# Patient Record
Sex: Male | Born: 1970
Health system: Southern US, Community
[De-identification: ages and names within clinical notes are randomized; demographics above are authoritative.]

## PROBLEM LIST (undated history)

## (undated) DIAGNOSIS — E119 Type 2 diabetes mellitus without complications: Secondary | ICD-10-CM

## (undated) DIAGNOSIS — E785 Hyperlipidemia, unspecified: Secondary | ICD-10-CM

## (undated) DIAGNOSIS — G4730 Sleep apnea, unspecified: Secondary | ICD-10-CM

## (undated) DIAGNOSIS — G4733 Obstructive sleep apnea (adult) (pediatric): Secondary | ICD-10-CM

## (undated) DIAGNOSIS — G709 Myoneural disorder, unspecified: Secondary | ICD-10-CM

## (undated) DIAGNOSIS — I10 Essential (primary) hypertension: Secondary | ICD-10-CM

## (undated) DIAGNOSIS — F419 Anxiety disorder, unspecified: Secondary | ICD-10-CM

## (undated) DIAGNOSIS — N200 Calculus of kidney: Secondary | ICD-10-CM

## (undated) DIAGNOSIS — F32A Depression, unspecified: Secondary | ICD-10-CM

## (undated) DIAGNOSIS — Z87442 Personal history of urinary calculi: Secondary | ICD-10-CM

## (undated) DIAGNOSIS — I739 Peripheral vascular disease, unspecified: Secondary | ICD-10-CM

## (undated) DIAGNOSIS — J45909 Unspecified asthma, uncomplicated: Secondary | ICD-10-CM

## (undated) HISTORY — DX: Depression, unspecified: F32.A

## (undated) HISTORY — DX: Calculus of kidney: N20.0

## (undated) HISTORY — PX: ABDOMINAL SURGERY: SHX537

## (undated) HISTORY — PX: WISDOM TOOTH EXTRACTION: SHX21

## (undated) HISTORY — DX: Sleep apnea, unspecified: G47.30

---
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
## 2000-06-05 ENCOUNTER — Encounter: Payer: Self-pay | Admitting: Emergency Medicine

## 2000-06-05 ENCOUNTER — Emergency Department (HOSPITAL_COMMUNITY): Admission: EM | Admit: 2000-06-05 | Discharge: 2000-06-05 | Payer: Self-pay | Admitting: Emergency Medicine

## 2003-08-08 ENCOUNTER — Ambulatory Visit (HOSPITAL_BASED_OUTPATIENT_CLINIC_OR_DEPARTMENT_OTHER): Admission: RE | Admit: 2003-08-08 | Discharge: 2003-08-08 | Payer: Self-pay | Admitting: Family Medicine

## 2008-06-17 ENCOUNTER — Emergency Department (HOSPITAL_BASED_OUTPATIENT_CLINIC_OR_DEPARTMENT_OTHER): Admission: EM | Admit: 2008-06-17 | Discharge: 2008-06-17 | Payer: Self-pay | Admitting: Emergency Medicine

## 2009-09-29 HISTORY — PX: CYST EXCISION: SHX5701

## 2011-06-30 LAB — CBC
HCT: 41.7
Hemoglobin: 14.4
MCHC: 34.6
MCV: 79.6
Platelets: 146 — ABNORMAL LOW
RBC: 5.23
RDW: 13.1
WBC: 5.4

## 2011-06-30 LAB — BASIC METABOLIC PANEL
BUN: 12
CO2: 20
Calcium: 9.6
Chloride: 105
Creatinine, Ser: 1.1
GFR calc Af Amer: >60
GFR calc non Af Amer: >60
Glucose, Bld: 193 — ABNORMAL HIGH
Potassium: 4.7
Sodium: 136

## 2011-06-30 LAB — DIFFERENTIAL
Basophils Absolute: 0
Basophils Relative: 1
Eosinophils Absolute: 0
Eosinophils Relative: 0
Lymphocytes Relative: 18
Lymphs Abs: 1
Monocytes Absolute: 0.2
Monocytes Relative: 5
Neutro Abs: 4.2
Neutrophils Relative %: 76

## 2011-06-30 LAB — GLUCOSE, CAPILLARY: Glucose-Capillary: 212 — ABNORMAL HIGH

## 2016-09-25 DIAGNOSIS — E1165 Type 2 diabetes mellitus with hyperglycemia: Secondary | ICD-10-CM | POA: Diagnosis not present

## 2016-09-25 DIAGNOSIS — E7800 Pure hypercholesterolemia, unspecified: Secondary | ICD-10-CM | POA: Diagnosis not present

## 2016-09-25 DIAGNOSIS — G4730 Sleep apnea, unspecified: Secondary | ICD-10-CM | POA: Diagnosis not present

## 2016-09-25 DIAGNOSIS — I10 Essential (primary) hypertension: Secondary | ICD-10-CM | POA: Diagnosis not present

## 2017-08-10 DIAGNOSIS — I10 Essential (primary) hypertension: Secondary | ICD-10-CM | POA: Diagnosis not present

## 2017-08-10 DIAGNOSIS — E1165 Type 2 diabetes mellitus with hyperglycemia: Secondary | ICD-10-CM | POA: Diagnosis not present

## 2017-08-10 DIAGNOSIS — F324 Major depressive disorder, single episode, in partial remission: Secondary | ICD-10-CM | POA: Diagnosis not present

## 2017-08-10 DIAGNOSIS — Z125 Encounter for screening for malignant neoplasm of prostate: Secondary | ICD-10-CM | POA: Diagnosis not present

## 2017-08-10 DIAGNOSIS — E7800 Pure hypercholesterolemia, unspecified: Secondary | ICD-10-CM | POA: Diagnosis not present

## 2017-11-10 DIAGNOSIS — E1165 Type 2 diabetes mellitus with hyperglycemia: Secondary | ICD-10-CM | POA: Diagnosis not present

## 2017-11-10 DIAGNOSIS — N5202 Corporo-venous occlusive erectile dysfunction: Secondary | ICD-10-CM | POA: Diagnosis not present

## 2017-11-10 DIAGNOSIS — E7800 Pure hypercholesterolemia, unspecified: Secondary | ICD-10-CM | POA: Diagnosis not present

## 2017-11-10 DIAGNOSIS — I10 Essential (primary) hypertension: Secondary | ICD-10-CM | POA: Diagnosis not present

## 2018-12-29 DIAGNOSIS — F419 Anxiety disorder, unspecified: Secondary | ICD-10-CM | POA: Diagnosis not present

## 2018-12-29 DIAGNOSIS — I10 Essential (primary) hypertension: Secondary | ICD-10-CM | POA: Diagnosis not present

## 2018-12-29 DIAGNOSIS — E1165 Type 2 diabetes mellitus with hyperglycemia: Secondary | ICD-10-CM | POA: Diagnosis not present

## 2018-12-29 DIAGNOSIS — E7800 Pure hypercholesterolemia, unspecified: Secondary | ICD-10-CM | POA: Diagnosis not present

## 2019-01-10 DIAGNOSIS — G4733 Obstructive sleep apnea (adult) (pediatric): Secondary | ICD-10-CM | POA: Diagnosis not present

## 2019-01-19 DIAGNOSIS — G4733 Obstructive sleep apnea (adult) (pediatric): Secondary | ICD-10-CM | POA: Diagnosis not present

## 2019-02-10 DIAGNOSIS — G4733 Obstructive sleep apnea (adult) (pediatric): Secondary | ICD-10-CM | POA: Diagnosis not present

## 2019-03-13 DIAGNOSIS — G4733 Obstructive sleep apnea (adult) (pediatric): Secondary | ICD-10-CM | POA: Diagnosis not present

## 2019-04-12 DIAGNOSIS — G4733 Obstructive sleep apnea (adult) (pediatric): Secondary | ICD-10-CM | POA: Diagnosis not present

## 2019-04-24 ENCOUNTER — Emergency Department (HOSPITAL_BASED_OUTPATIENT_CLINIC_OR_DEPARTMENT_OTHER): Payer: BC Managed Care – PPO

## 2019-04-24 ENCOUNTER — Encounter (HOSPITAL_BASED_OUTPATIENT_CLINIC_OR_DEPARTMENT_OTHER): Payer: Self-pay | Admitting: Emergency Medicine

## 2019-04-24 ENCOUNTER — Inpatient Hospital Stay (HOSPITAL_BASED_OUTPATIENT_CLINIC_OR_DEPARTMENT_OTHER)
Admission: EM | Admit: 2019-04-24 | Discharge: 2019-04-26 | DRG: 176 | Disposition: A | Discharge status: Home / Self Care | Payer: BC Managed Care – PPO | Attending: Internal Medicine | Admitting: Internal Medicine

## 2019-04-24 ENCOUNTER — Other Ambulatory Visit: Payer: Self-pay

## 2019-04-24 DIAGNOSIS — E669 Obesity, unspecified: Secondary | ICD-10-CM | POA: Diagnosis not present

## 2019-04-24 DIAGNOSIS — I2699 Other pulmonary embolism without acute cor pulmonale: Principal | ICD-10-CM | POA: Diagnosis present

## 2019-04-24 DIAGNOSIS — Z7984 Long term (current) use of oral hypoglycemic drugs: Secondary | ICD-10-CM

## 2019-04-24 DIAGNOSIS — F419 Anxiety disorder, unspecified: Secondary | ICD-10-CM | POA: Diagnosis not present

## 2019-04-24 DIAGNOSIS — I10 Essential (primary) hypertension: Secondary | ICD-10-CM | POA: Diagnosis present

## 2019-04-24 DIAGNOSIS — J45909 Unspecified asthma, uncomplicated: Secondary | ICD-10-CM | POA: Diagnosis present

## 2019-04-24 DIAGNOSIS — E1165 Type 2 diabetes mellitus with hyperglycemia: Secondary | ICD-10-CM | POA: Diagnosis not present

## 2019-04-24 DIAGNOSIS — J4520 Mild intermittent asthma, uncomplicated: Secondary | ICD-10-CM | POA: Diagnosis not present

## 2019-04-24 DIAGNOSIS — E785 Hyperlipidemia, unspecified: Secondary | ICD-10-CM | POA: Diagnosis not present

## 2019-04-24 DIAGNOSIS — I361 Nonrheumatic tricuspid (valve) insufficiency: Secondary | ICD-10-CM | POA: Diagnosis not present

## 2019-04-24 DIAGNOSIS — G4733 Obstructive sleep apnea (adult) (pediatric): Secondary | ICD-10-CM | POA: Diagnosis not present

## 2019-04-24 DIAGNOSIS — Z79899 Other long term (current) drug therapy: Secondary | ICD-10-CM | POA: Diagnosis not present

## 2019-04-24 DIAGNOSIS — R000 Tachycardia, unspecified: Secondary | ICD-10-CM | POA: Diagnosis not present

## 2019-04-24 DIAGNOSIS — E0865 Diabetes mellitus due to underlying condition with hyperglycemia: Secondary | ICD-10-CM | POA: Diagnosis not present

## 2019-04-24 DIAGNOSIS — R0602 Shortness of breath: Secondary | ICD-10-CM | POA: Diagnosis not present

## 2019-04-24 DIAGNOSIS — Z20828 Contact with and (suspected) exposure to other viral communicable diseases: Secondary | ICD-10-CM | POA: Diagnosis present

## 2019-04-24 HISTORY — DX: Other pulmonary embolism without acute cor pulmonale: I26.99

## 2019-04-24 HISTORY — DX: Obstructive sleep apnea (adult) (pediatric): G47.33

## 2019-04-24 HISTORY — DX: Anxiety disorder, unspecified: F41.9

## 2019-04-24 HISTORY — DX: Hyperlipidemia, unspecified: E78.5

## 2019-04-24 HISTORY — DX: Type 2 diabetes mellitus without complications: E11.9

## 2019-04-24 HISTORY — DX: Unspecified asthma, uncomplicated: J45.909

## 2019-04-24 LAB — CBC WITH DIFFERENTIAL/PLATELET
Abs Immature Granulocytes: 0.02 10*3/uL (ref 0.00–0.07)
Basophils Absolute: 0 10*3/uL (ref 0.0–0.1)
Basophils Relative: 0 %
Eosinophils Absolute: 0.2 10*3/uL (ref 0.0–0.5)
Eosinophils Relative: 2 %
HCT: 46.3 % (ref 39.0–52.0)
Hemoglobin: 15.6 g/dL (ref 13.0–17.0)
Immature Granulocytes: 0 %
Lymphocytes Relative: 21 %
Lymphs Abs: 1.8 10*3/uL (ref 0.7–4.0)
MCH: 27 pg (ref 26.0–34.0)
MCHC: 33.7 g/dL (ref 30.0–36.0)
MCV: 80.1 fL (ref 80.0–100.0)
Monocytes Absolute: 0.5 10*3/uL (ref 0.1–1.0)
Monocytes Relative: 6 %
Neutro Abs: 5.9 10*3/uL (ref 1.7–7.7)
Neutrophils Relative %: 71 %
Platelets: 188 10*3/uL (ref 150–400)
RBC: 5.78 MIL/uL (ref 4.22–5.81)
RDW: 13.2 % (ref 11.5–15.5)
WBC: 8.5 10*3/uL (ref 4.0–10.5)
nRBC: 0 % (ref 0.0–0.2)

## 2019-04-24 LAB — COMPREHENSIVE METABOLIC PANEL
ALT: 29 U/L (ref 0–44)
AST: 18 U/L (ref 15–41)
Albumin: 4.5 g/dL (ref 3.5–5.0)
Alkaline Phosphatase: 51 U/L (ref 38–126)
Anion gap: 14 (ref 5–15)
BUN: 15 mg/dL (ref 6–20)
CO2: 19 mmol/L — ABNORMAL LOW (ref 22–32)
Calcium: 9.4 mg/dL (ref 8.9–10.3)
Chloride: 104 mmol/L (ref 98–111)
Creatinine, Ser: 1.07 mg/dL (ref 0.61–1.24)
GFR calc Af Amer: >60 mL/min (ref >60)
GFR calc non Af Amer: >60 mL/min (ref >60)
Glucose, Bld: 244 mg/dL — ABNORMAL HIGH (ref 70–99)
Potassium: 4.3 mmol/L (ref 3.5–5.1)
Sodium: 137 mmol/L (ref 135–145)
Total Bilirubin: 1.2 mg/dL (ref 0.3–1.2)
Total Protein: 7.6 g/dL (ref 6.5–8.1)

## 2019-04-24 LAB — BRAIN NATRIURETIC PEPTIDE: B Natriuretic Peptide: 14.9 pg/mL (ref 0.0–100.0)

## 2019-04-24 LAB — D-DIMER, QUANTITATIVE: D-Dimer, Quant: 2.2 ug/mL-FEU — ABNORMAL HIGH (ref 0.00–0.50)

## 2019-04-24 LAB — SARS CORONAVIRUS 2 BY RT PCR (HOSPITAL ORDER, PERFORMED IN ~~LOC~~ HOSPITAL LAB): SARS Coronavirus 2: NEGATIVE

## 2019-04-24 LAB — TROPONIN I (HIGH SENSITIVITY): Troponin I (High Sensitivity): 6 ng/L (ref <18)

## 2019-04-24 MED ORDER — DIPHENHYDRAMINE HCL 25 MG PO CAPS: 50.0000 mg | ORAL_CAPSULE | Freq: Once | ORAL | Status: AC

## 2019-04-24 MED ORDER — HYDROCORTISONE NA SUCCINATE PF 100 MG IJ SOLR
INTRAMUSCULAR | Status: AC
  Filled 2019-04-24: qty 4

## 2019-04-24 MED ORDER — HEPARIN BOLUS VIA INFUSION
5500.0000 [IU] | Freq: Once | INTRAVENOUS | Status: AC
  Administered 2019-04-24: 5500 [IU] via INTRAVENOUS

## 2019-04-24 MED ORDER — HYDROCORTISONE NA SUCCINATE PF 250 MG IJ SOLR
200.0000 mg | Freq: Once | INTRAMUSCULAR | Status: AC
  Administered 2019-04-24: 200 mg via INTRAVENOUS
  Filled 2019-04-24: qty 200

## 2019-04-24 MED ORDER — IOHEXOL 350 MG/ML SOLN
100.0000 mL | Freq: Once | INTRAVENOUS | Status: AC | PRN
  Administered 2019-04-24: 100 mL via INTRAVENOUS

## 2019-04-24 MED ORDER — DIPHENHYDRAMINE HCL 50 MG/ML IJ SOLN
50.0000 mg | Freq: Once | INTRAMUSCULAR | Status: AC
  Administered 2019-04-24: 50 mg via INTRAVENOUS
  Filled 2019-04-24: qty 1

## 2019-04-24 MED ORDER — HEPARIN (PORCINE) 25000 UT/250ML-% IV SOLN: 16.0000 [IU]/kg/h | INTRAVENOUS | Status: DC

## 2019-04-24 MED ORDER — HEPARIN (PORCINE) 25000 UT/250ML-% IV SOLN
2100.0000 [IU]/h | INTRAVENOUS | Status: DC
  Administered 2019-04-24: 1500 [IU]/h via INTRAVENOUS
  Administered 2019-04-25: 1800 [IU]/h via INTRAVENOUS
  Filled 2019-04-24 (×4): qty 250

## 2019-04-24 NOTE — ED Triage Notes (Signed)
SOB since Thursday with exertion, inhalers not working

## 2019-04-24 NOTE — ED Notes (Signed)
Per EDMD, pt being premedicated per 4 hour premed protocol; EDMD spoke w/Dr. Nevada Crane (radiologist) about allergy

## 2019-04-24 NOTE — ED Notes (Signed)
Called [facility] to advise that Covid test was negative and patient was ready for transport

## 2019-04-24 NOTE — ED Notes (Signed)
Dr Yelverton at bedside.  

## 2019-04-24 NOTE — Progress Notes (Signed)
ANTICOAGULATION CONSULT NOTE - Initial Consult  Pharmacy Consult for heparin Indication: pulmonary embolus  Allergies  Allergen Reactions  . Contrast Media [Iodinated Diagnostic Agents] Hives    As a 48 year old experienced hives and nausea    Patient Measurements: Height: 5\' 10"  (177.8 cm) Weight: 220 lb (99.8 kg) IBW/kg (Calculated) : 73 Heparin Dosing Weight: 94kg  Vital Signs: Temp: 98.1 F (36.7 C) (07/26 1515) Temp Source: Oral (07/26 1515) BP: 142/89 (07/26 2000) Pulse Rate: 108 (07/26 2000)  Labs: Recent Labs    04/24/19 1537 04/24/19 1539  HGB  --  15.6  HCT  --  46.3  PLT  --  188  CREATININE  --  1.07  TROPONINIHS 6  --     Estimated Creatinine Clearance: 101 mL/min (by C-G formula based on SCr of 1.07 mg/dL).   Medical History: No past medical history on file.  Assessment: 48 presenting with bilateral PE, no anticoagulation PTA, CBC wnl.  Goal of Therapy:  Heparin level 0.3-0.7 units/ml Monitor platelets by anticoagulation protocol: Yes   Plan:  Heparin 5500 units IV x1, and gtt at 1500 units/hr F/u 6 hour heparin level  Bertis Ruddy, PharmD Clinical Pharmacist Please check AMION for all [place] numbers 04/24/2019 9:12 PM

## 2019-04-24 NOTE — ED Notes (Signed)
Pt reports having panic attacks and feeling anxious prior to arrival. Pt voiced taking xanax PTA to help.

## 2019-04-24 NOTE — ED Notes (Signed)
No need to obtain repeat troponin per EDP

## 2019-04-24 NOTE — ED Notes (Signed)
Updated pt on plan of care, bed assignment.

## 2019-04-24 NOTE — ED Notes (Signed)
Patient transported to CT 

## 2019-04-24 NOTE — ED Notes (Signed)
Pt used inhaler PTA with no relief and mucinex.

## 2019-04-24 NOTE — ED Notes (Signed)
Attempted to obtain 2nd IV- unsuccessful

## 2019-04-24 NOTE — ED Notes (Signed)
Pt also reports using cpap at night and unable to catch his breath last PM.

## 2019-04-24 NOTE — Care Plan (Signed)
Pt with h/o asthma.  In with apparently unprovoked PEs.  Tachy 110.  Satting 94 on RA.  Will send to tele, inpatient.

## 2019-04-24 NOTE — ED Triage Notes (Signed)
Pt has hx of asthma- presents with SOB and chest tightness.

## 2019-04-25 ENCOUNTER — Inpatient Hospital Stay (HOSPITAL_COMMUNITY): Payer: BC Managed Care – PPO

## 2019-04-25 ENCOUNTER — Encounter (HOSPITAL_COMMUNITY): Payer: Self-pay | Admitting: Internal Medicine

## 2019-04-25 DIAGNOSIS — F419 Anxiety disorder, unspecified: Secondary | ICD-10-CM | POA: Diagnosis present

## 2019-04-25 DIAGNOSIS — J4520 Mild intermittent asthma, uncomplicated: Secondary | ICD-10-CM

## 2019-04-25 DIAGNOSIS — G4733 Obstructive sleep apnea (adult) (pediatric): Secondary | ICD-10-CM | POA: Diagnosis present

## 2019-04-25 DIAGNOSIS — I361 Nonrheumatic tricuspid (valve) insufficiency: Secondary | ICD-10-CM

## 2019-04-25 DIAGNOSIS — I2699 Other pulmonary embolism without acute cor pulmonale: Principal | ICD-10-CM

## 2019-04-25 DIAGNOSIS — J45909 Unspecified asthma, uncomplicated: Secondary | ICD-10-CM | POA: Diagnosis present

## 2019-04-25 DIAGNOSIS — E785 Hyperlipidemia, unspecified: Secondary | ICD-10-CM | POA: Diagnosis present

## 2019-04-25 LAB — GLUCOSE, CAPILLARY
Glucose-Capillary: 214 mg/dL — ABNORMAL HIGH (ref 70–99)
Glucose-Capillary: 193 mg/dL — ABNORMAL HIGH (ref 70–99)
Glucose-Capillary: 226 mg/dL — ABNORMAL HIGH (ref 70–99)
Glucose-Capillary: 218 mg/dL — ABNORMAL HIGH (ref 70–99)
Glucose-Capillary: 194 mg/dL — ABNORMAL HIGH (ref 70–99)

## 2019-04-25 LAB — BASIC METABOLIC PANEL
Anion gap: 12 (ref 5–15)
BUN: 15 mg/dL (ref 6–20)
CO2: 21 mmol/L — ABNORMAL LOW (ref 22–32)
Calcium: 9.3 mg/dL (ref 8.9–10.3)
Chloride: 104 mmol/L (ref 98–111)
Creatinine, Ser: 1 mg/dL (ref 0.61–1.24)
GFR calc Af Amer: >60 mL/min (ref >60)
GFR calc non Af Amer: >60 mL/min (ref >60)
Glucose, Bld: 233 mg/dL — ABNORMAL HIGH (ref 70–99)
Potassium: 4 mmol/L (ref 3.5–5.1)
Sodium: 137 mmol/L (ref 135–145)

## 2019-04-25 LAB — CBC
HCT: 44.2 % (ref 39.0–52.0)
Hemoglobin: 15.3 g/dL (ref 13.0–17.0)
MCH: 27.2 pg (ref 26.0–34.0)
MCHC: 34.6 g/dL (ref 30.0–36.0)
MCV: 78.6 fL — ABNORMAL LOW (ref 80.0–100.0)
Platelets: 214 10*3/uL (ref 150–400)
RBC: 5.62 MIL/uL (ref 4.22–5.81)
RDW: 13.2 % (ref 11.5–15.5)
WBC: 7.7 10*3/uL (ref 4.0–10.5)
nRBC: 0 % (ref 0.0–0.2)

## 2019-04-25 LAB — ECHOCARDIOGRAM COMPLETE
Height: 70 in
Weight: 3520 oz

## 2019-04-25 LAB — HEMOGLOBIN A1C
Hgb A1c MFr Bld: 10.1 % — ABNORMAL HIGH (ref 4.8–5.6)
Mean Plasma Glucose: 243.17 mg/dL

## 2019-04-25 LAB — HEPARIN LEVEL (UNFRACTIONATED)
Heparin Unfractionated: <0.1 IU/mL — ABNORMAL LOW (ref 0.30–0.70)
Heparin Unfractionated: 0.29 IU/mL — ABNORMAL LOW (ref 0.30–0.70)
Heparin Unfractionated: 0.41 IU/mL (ref 0.30–0.70)

## 2019-04-25 LAB — ANTITHROMBIN III: AntiThromb III Func: 106 % (ref 75–120)

## 2019-04-25 LAB — HIV ANTIBODY (ROUTINE TESTING W REFLEX): HIV Screen 4th Generation wRfx: NONREACTIVE

## 2019-04-25 MED ORDER — HEPARIN BOLUS VIA INFUSION
1500.0000 [IU] | Freq: Once | INTRAVENOUS | Status: AC
  Administered 2019-04-25: 1500 [IU] via INTRAVENOUS
  Filled 2019-04-25: qty 1500

## 2019-04-25 MED ORDER — SODIUM CHLORIDE 0.9 % IV BOLUS
500.0000 mL | Freq: Once | INTRAVENOUS | Status: AC
  Administered 2019-04-25: 500 mL via INTRAVENOUS

## 2019-04-25 MED ORDER — HYDRALAZINE HCL 20 MG/ML IJ SOLN: 5.0000 mg | INTRAMUSCULAR | Status: DC | PRN

## 2019-04-25 MED ORDER — ONDANSETRON HCL 4 MG/2ML IJ SOLN: 4.0000 mg | Freq: Four times a day (QID) | INTRAMUSCULAR | Status: DC | PRN

## 2019-04-25 MED ORDER — FENOFIBRATE 160 MG PO TABS
160.0000 mg | ORAL_TABLET | Freq: Every day | ORAL | Status: DC
  Administered 2019-04-25 – 2019-04-26 (×2): 160 mg via ORAL
  Filled 2019-04-25 (×3): qty 1

## 2019-04-25 MED ORDER — OXYCODONE-ACETAMINOPHEN 5-325 MG PO TABS: 1.0000 | ORAL_TABLET | ORAL | Status: DC | PRN

## 2019-04-25 MED ORDER — CALCIUM CARBONATE ANTACID 500 MG PO CHEW
400.0000 mg | CHEWABLE_TABLET | Freq: Once | ORAL | Status: AC
  Administered 2019-04-25: 400 mg via ORAL
  Filled 2019-04-25: qty 2

## 2019-04-25 MED ORDER — LOSARTAN POTASSIUM 50 MG PO TABS
50.0000 mg | ORAL_TABLET | Freq: Every day | ORAL | Status: DC
  Administered 2019-04-25: 50 mg via ORAL
  Filled 2019-04-25: qty 1

## 2019-04-25 MED ORDER — DM-GUAIFENESIN ER 30-600 MG PO TB12: 1.0000 | ORAL_TABLET | Freq: Two times a day (BID) | ORAL | Status: DC | PRN

## 2019-04-25 MED ORDER — LEVALBUTEROL TARTRATE 45 MCG/ACT IN AERO: 2.0000 | INHALATION_SPRAY | Freq: Four times a day (QID) | RESPIRATORY_TRACT | Status: DC | PRN

## 2019-04-25 MED ORDER — ACETAMINOPHEN 325 MG PO TABS
650.0000 mg | ORAL_TABLET | Freq: Four times a day (QID) | ORAL | Status: DC | PRN
  Administered 2019-04-25 – 2019-04-26 (×2): 650 mg via ORAL
  Filled 2019-04-25 (×2): qty 2

## 2019-04-25 MED ORDER — ALPRAZOLAM 0.25 MG PO TABS
0.2500 mg | ORAL_TABLET | Freq: Two times a day (BID) | ORAL | Status: DC | PRN
  Administered 2019-04-25 (×2): 0.25 mg via ORAL
  Filled 2019-04-25 (×2): qty 1

## 2019-04-25 MED ORDER — ALBUTEROL SULFATE (2.5 MG/3ML) 0.083% IN NEBU
2.5000 mg | INHALATION_SOLUTION | Freq: Four times a day (QID) | RESPIRATORY_TRACT | Status: DC | PRN
  Administered 2019-04-25: 2.5 mg via RESPIRATORY_TRACT
  Filled 2019-04-25: qty 3

## 2019-04-25 MED ORDER — ACETAMINOPHEN 650 MG RE SUPP: 650.0000 mg | Freq: Four times a day (QID) | RECTAL | Status: DC | PRN

## 2019-04-25 MED ORDER — ONDANSETRON HCL 4 MG PO TABS: 4.0000 mg | ORAL_TABLET | Freq: Four times a day (QID) | ORAL | Status: DC | PRN

## 2019-04-25 MED ORDER — SODIUM CHLORIDE 0.9 % IV SOLN
INTRAVENOUS | Status: DC
  Administered 2019-04-25: 03:00:00 via INTRAVENOUS

## 2019-04-25 MED ORDER — INSULIN ASPART 100 UNIT/ML ~~LOC~~ SOLN
0.0000 [IU] | Freq: Three times a day (TID) | SUBCUTANEOUS | Status: DC
  Administered 2019-04-25 (×2): 3 [IU] via SUBCUTANEOUS
  Administered 2019-04-25: 2 [IU] via SUBCUTANEOUS

## 2019-04-25 MED ORDER — HEPARIN BOLUS VIA INFUSION
3000.0000 [IU] | Freq: Once | INTRAVENOUS | Status: AC
  Administered 2019-04-25: 3000 [IU] via INTRAVENOUS
  Filled 2019-04-25: qty 3000

## 2019-04-25 MED ORDER — INSULIN ASPART 100 UNIT/ML ~~LOC~~ SOLN
0.0000 [IU] | Freq: Every day | SUBCUTANEOUS | Status: DC
  Administered 2019-04-25: 2 [IU] via SUBCUTANEOUS

## 2019-04-25 NOTE — H&P (Signed)
History and Physical    [name]:[id number] DOB: 04-12-1971 DOA: 04/24/2019  Referring MD/NP/PA:   PCP: Shirline Frees, MD   Patient coming from:  The patient is coming from home.  At baseline, pt is independent for most of ADL.        Chief Complaint: Shortness of breath  HPI: Steven [name] is a 48 y.o. male with medical history significant of hypertension, hyperlipidemia, diabetes mellitus, asthma, OSA on CPAP, anxiety, who presents with shortness of breath.  Patient states that he has been having shortness of breath for 3 days, which is worse on exertion.  He has mild dry cough and chest tightness, but no chest pain.  No fever or chills.  No recent long distance traveling.  Denies tenderness in the calf areas.  Patient does not have nausea, vomiting, diarrhea, abdominal pain, symptoms of UTI or unilateral weakness.  ED Course: pt was found to have positive d-dimer, negative COVID-19 test, creatinine 1.07, BUN 15, temperature normal, tachycardia, tachypnea, oxygen saturation 92 to 99% on room air, blood pressure 141/95.  Chest x-ray negative.  CT angiogram showed bilateral PE with CT evidence of right heart straining.  Patient is admitted to telemetry bed as inpatient.  Review of Systems:   General: no fevers, chills, no body weight gain, has fatigue HEENT: no blurry vision, hearing changes or sore throat Respiratory: has dyspnea, coughing, no wheezing CV: no chest pain, no palpitations GI: no nausea, vomiting, abdominal pain, diarrhea, constipation GU: no dysuria, burning on urination, increased urinary frequency, hematuria  Ext: no leg edema Neuro: no unilateral weakness, numbness, or tingling, no vision change or hearing loss Skin: no rash, no skin tear. MSK: No muscle spasm, no deformity, no limitation of range of movement in spin Heme: No easy bruising.  Travel history: No recent long distant travel.  Allergy:  Allergies  Allergen Reactions  . Contrast Media  [Iodinated Diagnostic Agents] Hives    As a 48 year old experienced hives and nausea    Past Medical History:  Diagnosis Date  . Anxiety   . Asthma   . Diabetes mellitus without complication ([place])   . HLD (hyperlipidemia)   . OSA (obstructive sleep apnea)     History reviewed. No pertinent surgical history.  Social History:  reports that he has never smoked. He has never used smokeless tobacco. He reports previous alcohol use. He reports that he does not use drugs.  Family History:  Family History  Problem Relation Age of Onset  . Diabetes Mellitus II Mother   . Thrombophlebitis Father      Prior to Admission medications   Not on File    Physical Exam: Vitals:   04/24/19 2000 04/24/19 2300 04/24/19 2307 04/25/19 0014  BP: (!) 142/89  138/85 (!) 141/95  Pulse: (!) 108 (!) 108 (!) 108 (!) 112  Resp: 15 (!) 21 19   Temp:   98.3 F (36.8 C) 98.4 F (36.9 C)  TempSrc:   Oral Oral  SpO2: 94% 95% 94% 99%  Weight:      Height:       General: Not in acute distress HEENT:       Eyes: PERRL, EOMI, no scleral icterus.       ENT: No discharge from the ears and nose, no pharynx injection, no tonsillar enlargement.        Neck: No JVD, no bruit, no mass felt. Heme: No neck lymph node enlargement. Cardiac: S1/S2, RRR, tachycardia, no murmurs,  No gallops or rubs. Respiratory: No rales, wheezing, rhonchi or rubs. GI: Soft, nondistended, nontender, no rebound pain, no organomegaly, BS present. GU: No hematuria Ext: No pitting leg edema bilaterally. 2+DP/PT pulse bilaterally. Musculoskeletal: No joint deformities, No joint redness or warmth, no limitation of ROM in spin. Skin: No rashes.  Neuro: Alert, oriented X3, cranial nerves II-XII grossly intact, moves all extremities normally.  Psych: Patient is not psychotic, no suicidal or hemocidal ideation.  Labs on Admission: I have personally reviewed following labs and imaging studies  CBC: Recent Labs  Lab 04/24/19 1539   WBC 8.5  NEUTROABS 5.9  HGB 15.6  HCT 46.3  MCV 80.1  PLT 829   Basic Metabolic Panel: Recent Labs  Lab 04/24/19 1539  NA 137  K 4.3  CL 104  CO2 19*  GLUCOSE 244*  BUN 15  CREATININE 1.07  CALCIUM 9.4   GFR: Estimated Creatinine Clearance: 101 mL/min (by C-G formula based on SCr of 1.07 mg/dL). Liver Function Tests: Recent Labs  Lab 04/24/19 1539  AST 18  ALT 29  ALKPHOS 51  BILITOT 1.2  PROT 7.6  ALBUMIN 4.5   No results for input(s): LIPASE, AMYLASE in the last 168 hours. No results for input(s): AMMONIA in the last 168 hours. Coagulation Profile: No results for input(s): INR, PROTIME in the last 168 hours. Cardiac Enzymes: No results for input(s): CKTOTAL, CKMB, CKMBINDEX, TROPONINI in the last 168 hours. BNP (last 3 results) No results for input(s): PROBNP in the last 8760 hours. HbA1C: No results for input(s): HGBA1C in the last 72 hours. CBG: No results for input(s): GLUCAP in the last 168 hours. Lipid Profile: No results for input(s): CHOL, HDL, LDLCALC, TRIG, CHOLHDL, LDLDIRECT in the last 72 hours. Thyroid Function Tests: No results for input(s): TSH, T4TOTAL, FREET4, T3FREE, THYROIDAB in the last 72 hours. Anemia Panel: No results for input(s): VITAMINB12, FOLATE, FERRITIN, TIBC, IRON, RETICCTPCT in the last 72 hours. Urine analysis: No results found for: COLORURINE, APPEARANCEUR, LABSPEC, PHURINE, GLUCOSEU, HGBUR, BILIRUBINUR, KETONESUR, PROTEINUR, UROBILINOGEN, NITRITE, LEUKOCYTESUR Sepsis Labs: @LABRCNTIP (procalcitonin:4,lacticidven:4) ) Recent Results (from the past 240 hour(s))  SARS Coronavirus 2 (Performed in [place] hospital lab)     Status: None   Collection Time: 04/24/19  9:50 PM   Specimen: Nasopharyngeal Swab  Result Value Ref Range Status   SARS Coronavirus 2 NEGATIVE NEGATIVE Final    Comment: (NOTE) If result is NEGATIVE SARS-CoV-2 target nucleic acids are NOT DETECTED. The SARS-CoV-2 RNA is generally detectable in upper  and lower  respiratory specimens during the acute phase of infection. The lowest  concentration of SARS-CoV-2 viral copies this assay can detect is 250  copies / mL. A negative result does not preclude SARS-CoV-2 infection  and should not be used as the sole basis for treatment or other  patient management decisions.  A negative result may occur with  improper specimen collection / handling, submission of specimen other  than nasopharyngeal swab, presence of viral mutation(s) within the  areas targeted by this assay, and inadequate number of viral copies  (<250 copies / mL). A negative result must be combined with clinical  observations, patient history, and epidemiological information. If result is POSITIVE SARS-CoV-2 target nucleic acids are DETECTED. The SARS-CoV-2 RNA is generally detectable in upper and lower  respiratory specimens dur ing the acute phase of infection.  Positive  results are indicative of active infection with SARS-CoV-2.  Clinical  correlation with patient history and other diagnostic information is  necessary  to determine patient infection status.  Positive results do  not rule out bacterial infection or co-infection with other viruses. If result is PRESUMPTIVE POSTIVE SARS-CoV-2 nucleic acids MAY BE PRESENT.   A presumptive positive result was obtained on the submitted specimen  and confirmed on repeat testing.  While 2019 novel coronavirus  (SARS-CoV-2) nucleic acids may be present in the submitted sample  additional confirmatory testing may be necessary for epidemiological  and / or clinical management purposes  to differentiate between  SARS-CoV-2 and other Sarbecovirus currently known to infect humans.  If clinically indicated additional testing with an alternate test  methodology [phone]) is advised. The SARS-CoV-2 RNA is generally  detectable in upper and lower respiratory sp ecimens during the acute  phase of infection. The expected result is  Negative. Fact Sheet for Patients:  StrictlyIdeas.no Fact Sheet for Healthcare Providers: BankingDealers.co.za This test is not yet approved or cleared by the [place] FDA and has been authorized for detection and/or diagnosis of SARS-CoV-2 by FDA under an Emergency Use Authorization (EUA).  This EUA will remain in effect (meaning this test can be used) for the duration of the COVID-19 declaration under Section 564(b)(1) of the Act, 21 U.S.C. section 360bbb-3(b)(1), unless the authorization is terminated or revoked sooner. Performed at [facility], [address] [place], [place] [address]      Radiological Exams on Admission: Ct Angio Chest Pe W And/or Wo Contrast  Result Date: 04/24/2019 CLINICAL DATA:  Shortness of breath with exertion EXAM: CT ANGIOGRAPHY CHEST WITH CONTRAST TECHNIQUE: Multidetector CT imaging of the chest was performed using the standard protocol during bolus administration of intravenous contrast. Multiplanar CT image reconstructions and MIPs were obtained to evaluate the vascular anatomy. CONTRAST:  122mL OMNIPAQUE IOHEXOL 350 MG/ML SOLN COMPARISON:  Chest x-ray 04/24/2019 FINDINGS: Cardiovascular: Satisfactory opacification of the pulmonary arteries to the segmental level. Moderate to large filling defect within the right pulmonary artery with fairly extensive thrombus in the right upper lobe pulmonary vessels. Thrombus extension into the right inter lobar artery and multiple segmental and subsegmental right middle lobe and right lower lobe pulmonary branch vessels. Thrombus within the distal left pulmonary artery with extension of filling defect into left upper lobe segmental and subsegmental branch vessels. Acute thrombus within left lower lobe segmental and subsegmental branch vessels. RV LV ratio is elevated at 1.0. Heart size is normal. No pericardial effusion. Mediastinum/Nodes: No enlarged mediastinal,  hilar, or axillary lymph nodes. Thyroid gland, trachea, and esophagus demonstrate no significant findings. Lungs/Pleura: Lungs are clear. No pleural effusion or pneumothorax. Upper Abdomen: Hepatic steatosis.  No acute abnormality. Musculoskeletal: No chest wall abnormality. No acute or significant osseous findings. Review of the MIP images confirms the above findings. IMPRESSION: 1. Positive for acute bilateral right greater than left pulmonary emboli. Positive for acute PE with CT evidence of right heart strain (RV/LV Ratio = 1.0) consistent with at least submassive (intermediate risk) PE. The presence of right heart strain has been associated with an increased risk of morbidity and mortality. Please activate Code PE by paging [phone]. Critical Value/emergent results were called by telephone at the time of interpretation on 04/24/2019 at 8:55 pm to Dr. Julianne Rice , who verbally acknowledged these results. Electronically Signed   By: Donavan Foil M.D.   On: 04/24/2019 20:56   Dg Chest Port 1 View  Result Date: 04/24/2019 CLINICAL DATA:  Shortness of breath EXAM: PORTABLE CHEST 1 VIEW COMPARISON:  None. FINDINGS: The heart size and  mediastinal contours are within normal limits. Both lungs are clear. The visualized skeletal structures are unremarkable. IMPRESSION: No acute abnormality of the lungs in AP portable projection. Electronically Signed   By: Eddie Candle M.D.   On: 04/24/2019 16:15     EKG: Independently reviewed.  Sinus tachycardia, QTC 462, poor R wave progression, low voltage, LAE.    Assessment/Plan Principal Problem:   Bilateral pulmonary embolism (HCC) Active Problems:   Asthma   Anxiety   OSA (obstructive sleep apnea)   HLD (hyperlipidemia)   Diabetes mellitus without complication ([place])   Bilateral pulmonary embolism [facility]): Patient has bilateral unprovoked PE.  Has a CT evidence of right heart straining.  Patient has tachycardia, but currently hemodynamically stable.   Oxygen saturation 92 to 99% on room air.  Negative troponin, BNP 14.9  -admit to tele bed as inpt -heparin drip initiated -2D echocardiogram ordered -LE dopplers ordered to evaluate for DVT -Hypercoag panel -pain control: When necessary Percocet for pain -prn Xopenex and mucinex   Asthma: stable -prn Xopenex and mucinex   Anxiety: -Continue home PRN Xanax  OSA (obstructive sleep apnea): -hold hold CPAP tonight due to right heart straining  HLD (hyperlipidemia): -Continue fenofibrate  Diabetes mellitus without complication ([place]): Last A1c is not on record. Patient is taking Janumet at home.  Blood sugar 244 -SSI -Check A1c     Inpatient status:  # Patient requires inpatient status due to high intensity of service, high risk for further deterioration and high frequency of surveillance required.  I certify that at the point of admission it is my clinical judgment that the patient will require inpatient hospital care spanning beyond 2 midnights from the point of admission.  . This patient has multiple chronic comorbidities including hypertension, hyperlipidemia, diabetes mellitus, asthma, OSA on CPAP, anxiety . Now patient has presenting with bilateral pulmonary embolism with CT evidence of right heart straining . The worrisome physical exam findings include tachycardia . The initial radiographic and laboratory data are worrisome because of bilateral pulmonary embolism with CT evidence of right heart strain . Current medical needs: please see my assessment and plan . Predictability of an adverse outcome (risk): Patient has multiple comorbidities, now presents with bilateral pulmonary embolism with CT evidence of right heart strain.  Patient is still tachycardia.  Her presentation is highly complicated, and is at high risk for deteriorating.  Patient needs to be treated in hospital for at least 2 days.    V Heparin    Code Status: Full code Family Communication: None at bed  side.   Disposition Plan:  Anticipate discharge back to previous home environment Consults called:  none Admission status:  Inpatient/tele    Date of Service 04/25/2019    [place] [facility]   If 7PM-7AM, please contact night-coverage [email or web address] Password [email or web address] 04/25/2019, 1:34 AM

## 2019-04-25 NOTE — Progress Notes (Signed)
ANTICOAGULATION CONSULT NOTE - [place] for heparin Indication: pulmonary embolus  Allergies  Allergen Reactions  . Contrast Media [Iodinated Diagnostic Agents] Hives    As a 48 year old experienced hives and nausea    Patient Measurements: Height: 5\' 10"  (177.8 cm) Weight: 220 lb (99.8 kg) IBW/kg (Calculated) : 73 Heparin Dosing Weight: 94kg  Vital Signs: Temp: 98.4 F (36.9 C) (07/27 0014) Temp Source: Oral (07/27 0014) BP: 141/95 (07/27 0014) Pulse Rate: 112 (07/27 0014)  Labs: Recent Labs    04/24/19 1537 04/24/19 1539 04/25/19 0255  HGB  --  15.6 15.3  HCT  --  46.3 44.2  PLT  --  188 214  HEPARINUNFRC  --   --  <0.10*  CREATININE  --  1.07 1.00  TROPONINIHS 6  --   --     Estimated Creatinine Clearance: 108.1 mL/min (by C-G formula based on SCr of 1 mg/dL).   Medical History: Past Medical History:  Diagnosis Date  . Anxiety   . Asthma   . Diabetes mellitus without complication ([place])   . HLD (hyperlipidemia)   . OSA (obstructive sleep apnea)     Assessment: 47 presenting with bilateral PE, no anticoagulation PTA, CBC wnl. Initial heparin level undetectable, CBC stable this morning, no infusion issues.  Goal of Therapy:  Heparin level 0.3-0.7 units/ml Monitor platelets by anticoagulation protocol: Yes   Plan:  Heparin 3000 units x1 Increase heparin infusion to 1800 units/hr Recheck heparin level in [place], PharmD, BCPS Clinical Pharmacist Please check AMION for all [place] numbers 04/25/2019

## 2019-04-25 NOTE — Plan of Care (Signed)
Received care of pt as a transfer from [place], after phone report from [place]. Pt arrived with Heparin gtt infusing per MD order, no s/s of bleeding, no  c/o's pain or discomfort. VSS, afebrile. Plan care reviewed with  patient, N.O. implemented . No s/s of distress. Pt given PRN Xanax for anxiety with relief. Will continue to monitor.

## 2019-04-25 NOTE — Progress Notes (Signed)
VASCULAR LAB PRELIMINARY  PRELIMINARY  PRELIMINARY  PRELIMINARY  Bilateral lower extremity venous duplexcompleted.    Preliminary report:  See Cv proc for preliminary results.  Shaughn Thomley, RVT 04/25/2019, 11:53 AM

## 2019-04-25 NOTE — Progress Notes (Signed)
ANTICOAGULATION CONSULT NOTE - Follow-Up Consult  Pharmacy Consult for heparin Indication: pulmonary embolus  Patient Measurements: Height: 5\' 10"  (177.8 cm) Weight: 220 lb (99.8 kg) IBW/kg (Calculated) : 73 Heparin Dosing Weight: 94kg  Vital Signs: Temp: 97.6 F (36.4 C) (07/27 0732) Temp Source: Oral (07/27 0732) BP: 131/94 (07/27 0732) Pulse Rate: 108 (07/27 0732)  Labs: Recent Labs    04/24/19 1537 04/24/19 1539 04/25/19 0255 04/25/19 1039  HGB  --  15.6 15.3  --   HCT  --  46.3 44.2  --   PLT  --  188 214  --   HEPARINUNFRC  --   --  <0.10* 0.29*  CREATININE  --  1.07 1.00  --   TROPONINIHS 6  --   --   --     Estimated Creatinine Clearance: 108.1 mL/min (by C-G formula based on SCr of 1 mg/dL).   Assessment: 40 YOM who presented on 7/27 with SOB and found to have B/L PE. The patient was not on [facility] PTA. Pharmacy consulted to dose Heparin.  Heparin level this morning remains SUBtherapeutic though trending up (HL 0.29, goal of 0.3-0.7). Will aim for the higher end of the goal range with new acute PE.  Goal of Therapy:  Heparin level 0.3-0.7 units/ml Monitor platelets by anticoagulation protocol: Yes   Plan:  - Heparin 1500 unit bolus x 1 - Increase Heparin to 2000 units/hr (20 ml/hr) - Will continue to monitor for any signs/symptoms of bleeding and will follow up with heparin level in 6 hours   Thank you for allowing pharmacy to be a part of this patient's care.  Alycia Rossetti, PharmD, BCPS Clinical Pharmacist Clinical phone for 04/25/2019: [phone] 04/25/2019 11:56 AM   **Pharmacist phone directory can now be found on [email or web address] (PW [email or web address]).  Listed under [place].

## 2019-04-25 NOTE — Progress Notes (Signed)
ANTICOAGULATION CONSULT NOTE - Follow-Up Consult  Pharmacy Consult for heparin Indication: pulmonary embolus  Patient Measurements: Height: 5\' 10"  (177.8 cm) Weight: 220 lb (99.8 kg) IBW/kg (Calculated) : 73 Heparin Dosing Weight: 94kg  Vital Signs: Temp: 97.6 F (36.4 C) (07/27 1547) Temp Source: Oral (07/27 1547) BP: 128/86 (07/27 1547) Pulse Rate: 97 (07/27 1547)  Labs: Recent Labs    04/24/19 1537 04/24/19 1539 04/25/19 0255 04/25/19 1039 04/25/19 1752  HGB  --  15.6 15.3  --   --   HCT  --  46.3 44.2  --   --   PLT  --  188 214  --   --   HEPARINUNFRC  --   --  <0.10* 0.29* 0.41  CREATININE  --  1.07 1.00  --   --   TROPONINIHS 6  --   --   --   --     Estimated Creatinine Clearance: 108.1 mL/min (by C-G formula based on SCr of 1 mg/dL).   Assessment: 67 YOM who presented on 7/27 with SOB and found to have B/L PE. The patient was not on [facility] PTA. Pharmacy consulted to dose Heparin.  Heparin level this afternoon is now therapeutic (Heparin level 0.29 > 0.41). His goal heparin level is 0.3-0.7 but we will aim for the higher end of the goal range given new acute PE.  CBC WNL  Goal of Therapy:  Heparin level 0.3-0.7 units/ml Monitor platelets by anticoagulation protocol: Yes   Plan:  - Increase heparin drip to 2100 units/hr (21 ml/hr) to aim for heparin level on the higher end of the goal - Will continue to monitor for any signs/symptoms of bleeding  - Check daily heparin level and CBC  Thank you for allowing pharmacy to be a part of this patient's care.  Kennon Holter, PharmD PGY1 Ambulatory Care Pharmacy Resident [employer] Phone: [phone] 04/25/2019 6:39 PM

## 2019-04-25 NOTE — Progress Notes (Addendum)
Inpatient Diabetes Program Recommendations  AACE/ADA: New Consensus Statement on Inpatient Glycemic Control (2015)  Target Ranges:  Prepandial:   less than 140 mg/dL      Peak postprandial:   less than 180 mg/dL (1-2 hours)      Critically ill patients:  140 - 180 mg/dL   Results for HUSAYN, REIM (MRN [id number]) as of 04/25/2019 10:56  Ref. Range 04/25/2019 02:48 04/25/2019 07:30  Glucose-Capillary Latest Ref Range: 70 - 99 mg/dL 214 (H)  2 units NOVOLOG  193 (H)  2 units NOVOLOG    Results for JAIVION, KINGSLEY (MRN [id number]) as of 04/25/2019 10:56  Ref. Range 04/25/2019 02:55  Hemoglobin A1C Latest Ref Range: 4.8 - 5.6 % 10.1 (H)  (243 mg/dl)     Admit with: SOB/ Bilateral pulmonary embolism   History: DM  Home DM Meds: Janumet  Current Orders: Novolog Sensitive Correction Scale/ SSI (0-9 units) TID AC + HS     PCP: Dr. Shirline Frees  No Insurance listed  Pt received 200 mg Solucortef X 1 dose yest at 4:30pm.  Current A1c of 10.1% shows very poor glucose control at home.  Plan to speak with pt today to address home DM care.    Addendum 12:10pm- Met with pt today to discuss elevated A1c of 10.1%.  Pt stated to me that his last A1c when checked (6 months ago) was closer to 8%.  Last saw PCP Dr. Kenton Kingfisher about 3 months ago with a televisit.  No changes made to DM regimen.  Has been taking the same dose of Janumet for a while.  Diagnosed with DM about 13 years ago.  Has CBG meter at home but does not check frequently (only checking 1-2 times per week at most).  Told me he has been drinking a lot of milk lately and forgot that milk does have carbohydrates and contributes his lack of exercise and diet excursions to his elevated A1c.  Talked frankly with pt about the dangers of having consistently elevated CBGs at home.  Explained why we are checking CBGs so frequently and why we are giving insulin in the hospital.  Explained to pt that b/c his body is currently stressed  and b/c he received steroids that his CBGs will likely run higher than normal.  We discussed CBG goals, A1c goals etc.  Reviewed healthy eating plan for DM at home.  Discussed other diabetes medication options and need for close follow up with PCP in 3 months to have his A1c rechecked after making changes at home.  Pt open to checking CBGs 1-2 times per day and recording all resluts so PCP can review trends.  Seem motivated to make positive changes.  Does not want to start insulin at this time and would like to defer further diabetes management to his PCP.      --Will follow patient during hospitalization--  Wyn Quaker RN, MSN, CDE Diabetes Coordinator Inpatient Glycemic Control Team Team Pager: [phone] (8a-5p)

## 2019-04-25 NOTE — Progress Notes (Signed)
PROGRESS NOTE                                                                                                                                                                                                             Patient Demographics:    Steven [name], is a 48 y.o. male, DOB - 1970-10-18, DQQ:[id number]  Admit date - 04/24/2019   Admitting Physician Ivor Costa, MD  Outpatient Primary MD for the patient is Shirline Frees, MD  LOS - 1  Outpatient Specialists: None  Chief Complaint  Patient presents with  . Shortness of Breath       Brief Narrative 48 year old obese male with history of diabetes mellitus (on Janumet), poorly controlled, hypertension, OSA on CPAP, anxiety, asthma and hyperlipidemia presented with 3 days of increasing shortness of breath associated with mild dry cough, chest tightness, no fevers, chills or chest pain.  Denies any sedentary lifestyle or recent travel.  Denies any calf swelling or tenderness. In the ED he was tachycardic, tachypneic with O2 sat of 92-91% on room air.  Stable blood pressure and afebrile.  He had elevated d-dimer and CT angiogram of the chest showing bilateral PE with right heart screening. Patient tested negative for COVID-19.  Admitted to telemetry for further management.   Subjective:   Patient reports his breathing to be slightly better since presentation.  Has not been out of bed this morning.   Assessment  & Plan :    Principal Problem:   Bilateral pulmonary embolism ([place]) Findings of right heart strain on CT.  No clear underlying etiology.  No prior history or family history of blood clots. On IV heparin drip.  Follow 2D echo to check for right heart strain.  Doppler lower extremity to check for DVT. If hemodynamically stable in the next 24 hours we will plan to discharge him on NOAC (discussed multiple anticoagulation option with risks and benefits and patient  prefers to be on Eliquis). He needs to be treated for at least 6 months and then determine further duration of treatment. Hypercoagulable work-up ordered on admission which can be followed as outpatient.  Active Problems: Diabetes mellitus type 2 with hyperglycemia, uncontrolled (HCC) A1c of >10.  Only on Janumet.  Patient reports that lately he has not been following a strict diet regimen. Reports that his A1c has been as  good as 8 in the past.  Instructed on strict diet regimen, exercise and tight blood glucose control.  I have recommended that if his blood glucose are not well controlled he would need to be on insulin in near future.  He agrees to follow-up with his PCP after discharge.  Asthma Stable.  Continue home inhaler  Anxiety Continue PRN Xanax  Obstructive sleep apnea Resume CPAP if echo does not show any right heart strain.  Hyperlipidemia Continue fibrillate      Code Status : Full code  Family Communication  : None  Disposition Plan  : None  Barriers For Discharge : Active symptoms  Consults  : None  Procedures  : CT angiogram, 2D echo, Doppler lower extremity  DVT Prophylaxis  : IV heparin  Lab Results  Component Value Date   PLT 214 04/25/2019    Antibiotics  :    Anti-infectives (From admission, onward)   None        Objective:   Vitals:   04/24/19 2307 04/25/19 0014 04/25/19 0732 04/25/19 0900  BP: 138/85 (!) 141/95 (!) 131/94   Pulse: (!) 108 (!) 112 (!) 108   Resp: 19   (!) 22  Temp: 98.3 F (36.8 C) 98.4 F (36.9 C) 97.6 F (36.4 C)   TempSrc: Oral Oral Oral   SpO2: 94% 99% 95%   Weight:      Height:        Wt Readings from Last 3 Encounters:  04/24/19 99.8 kg     Intake/Output Summary (Last 24 hours) at 04/25/2019 1143 Last data filed at 04/25/2019 0636 Gross per 24 hour  Intake 1140.22 ml  Output 1250 ml  Net -109.78 ml    Physical exam Fatigued, not in distress HEENT: Moist mucosa, supple neck Chest: Clear  bilaterally CVs: S1 is tachycardic, no murmurs rub or gallop GI: Soft, nondistended, nontender Musculoskeletal: Warm, no edema, no calf swelling or tenderness     Data Review:    CBC Recent Labs  Lab 04/24/19 1539 04/25/19 0255  WBC 8.5 7.7  HGB 15.6 15.3  HCT 46.3 44.2  PLT 188 214  MCV 80.1 78.6*  MCH 27.0 27.2  MCHC 33.7 34.6  RDW 13.2 13.2  LYMPHSABS 1.8  --   MONOABS 0.5  --   EOSABS 0.2  --   BASOSABS 0.0  --     Chemistries  Recent Labs  Lab 04/24/19 1539 04/25/19 0255  NA 137 137  K 4.3 4.0  CL 104 104  CO2 19* 21*  GLUCOSE 244* 233*  BUN 15 15  CREATININE 1.07 1.00  CALCIUM 9.4 9.3  AST 18  --   ALT 29  --   ALKPHOS 51  --   BILITOT 1.2  --    ------------------------------------------------------------------------------------------------------------------ No results for input(s): CHOL, HDL, LDLCALC, TRIG, CHOLHDL, LDLDIRECT in the last 72 hours.  Lab Results  Component Value Date   HGBA1C 10.1 (H) 04/25/2019   ------------------------------------------------------------------------------------------------------------------ No results for input(s): TSH, T4TOTAL, T3FREE, THYROIDAB in the last 72 hours.  Invalid input(s): FREET3 ------------------------------------------------------------------------------------------------------------------ No results for input(s): VITAMINB12, FOLATE, FERRITIN, TIBC, IRON, RETICCTPCT in the last 72 hours.  Coagulation profile No results for input(s): INR, PROTIME in the last 168 hours.  Recent Labs    04/24/19 1539  DDIMER 2.20*    Cardiac Enzymes No results for input(s): CKMB, TROPONINI, MYOGLOBIN in the last 168 hours.  Invalid input(s): CK ------------------------------------------------------------------------------------------------------------------    Component Value Date/Time   BNP 14.9  04/24/2019 1537    Inpatient Medications  Scheduled Meds: . fenofibrate  160 mg Oral Daily  .  insulin aspart  0-5 Units Subcutaneous QHS  . insulin aspart  0-9 Units Subcutaneous TID WC  . losartan  50 mg Oral Daily   Continuous Infusions: . sodium chloride 75 mL/hr at 04/25/19 0356  . heparin 1,800 Units/hr (04/25/19 0727)   PRN Meds:.acetaminophen **OR** acetaminophen, albuterol, ALPRAZolam, dextromethorphan-guaiFENesin, hydrALAZINE, ondansetron **OR** ondansetron (ZOFRAN) IV, oxyCODONE-acetaminophen  Micro Results Recent Results (from the past 240 hour(s))  SARS Coronavirus 2 (Performed in [place] hospital lab)     Status: None   Collection Time: 04/24/19  9:50 PM   Specimen: Nasopharyngeal Swab  Result Value Ref Range Status   SARS Coronavirus 2 NEGATIVE NEGATIVE Final    Comment: (NOTE) If result is NEGATIVE SARS-CoV-2 target nucleic acids are NOT DETECTED. The SARS-CoV-2 RNA is generally detectable in upper and lower  respiratory specimens during the acute phase of infection. The lowest  concentration of SARS-CoV-2 viral copies this assay can detect is 250  copies / mL. A negative result does not preclude SARS-CoV-2 infection  and should not be used as the sole basis for treatment or other  patient management decisions.  A negative result may occur with  improper specimen collection / handling, submission of specimen other  than nasopharyngeal swab, presence of viral mutation(s) within the  areas targeted by this assay, and inadequate number of viral copies  (<250 copies / mL). A negative result must be combined with clinical  observations, patient history, and epidemiological information. If result is POSITIVE SARS-CoV-2 target nucleic acids are DETECTED. The SARS-CoV-2 RNA is generally detectable in upper and lower  respiratory specimens dur ing the acute phase of infection.  Positive  results are indicative of active infection with SARS-CoV-2.  Clinical  correlation with patient history and other diagnostic information is  necessary to determine patient  infection status.  Positive results do  not rule out bacterial infection or co-infection with other viruses. If result is PRESUMPTIVE POSTIVE SARS-CoV-2 nucleic acids MAY BE PRESENT.   A presumptive positive result was obtained on the submitted specimen  and confirmed on repeat testing.  While 2019 novel coronavirus  (SARS-CoV-2) nucleic acids may be present in the submitted sample  additional confirmatory testing may be necessary for epidemiological  and / or clinical management purposes  to differentiate between  SARS-CoV-2 and other Sarbecovirus currently known to infect humans.  If clinically indicated additional testing with an alternate test  methodology [phone]) is advised. The SARS-CoV-2 RNA is generally  detectable in upper and lower respiratory sp ecimens during the acute  phase of infection. The expected result is Negative. Fact Sheet for Patients:  StrictlyIdeas.no Fact Sheet for Healthcare Providers: BankingDealers.co.za This test is not yet approved or cleared by the [place] FDA and has been authorized for detection and/or diagnosis of SARS-CoV-2 by FDA under an Emergency Use Authorization (EUA).  This EUA will remain in effect (meaning this test can be used) for the duration of the COVID-19 declaration under Section 564(b)(1) of the Act, 21 U.S.C. section 360bbb-3(b)(1), unless the authorization is terminated or revoked sooner. Performed at [facility], [address] [place], [place] [address]     Radiology Reports Ct Angio Chest Pe W And/or Wo Contrast  Result Date: 04/24/2019 CLINICAL DATA:  Shortness of breath with exertion EXAM: CT ANGIOGRAPHY CHEST WITH CONTRAST TECHNIQUE: Multidetector CT imaging of the chest was performed using  the standard protocol during bolus administration of intravenous contrast. Multiplanar CT image reconstructions and MIPs were obtained to evaluate the vascular anatomy.  CONTRAST:  156mL OMNIPAQUE IOHEXOL 350 MG/ML SOLN COMPARISON:  Chest x-ray 04/24/2019 FINDINGS: Cardiovascular: Satisfactory opacification of the pulmonary arteries to the segmental level. Moderate to large filling defect within the right pulmonary artery with fairly extensive thrombus in the right upper lobe pulmonary vessels. Thrombus extension into the right inter lobar artery and multiple segmental and subsegmental right middle lobe and right lower lobe pulmonary branch vessels. Thrombus within the distal left pulmonary artery with extension of filling defect into left upper lobe segmental and subsegmental branch vessels. Acute thrombus within left lower lobe segmental and subsegmental branch vessels. RV LV ratio is elevated at 1.0. Heart size is normal. No pericardial effusion. Mediastinum/Nodes: No enlarged mediastinal, hilar, or axillary lymph nodes. Thyroid gland, trachea, and esophagus demonstrate no significant findings. Lungs/Pleura: Lungs are clear. No pleural effusion or pneumothorax. Upper Abdomen: Hepatic steatosis.  No acute abnormality. Musculoskeletal: No chest wall abnormality. No acute or significant osseous findings. Review of the MIP images confirms the above findings. IMPRESSION: 1. Positive for acute bilateral right greater than left pulmonary emboli. Positive for acute PE with CT evidence of right heart strain (RV/LV Ratio = 1.0) consistent with at least submassive (intermediate risk) PE. The presence of right heart strain has been associated with an increased risk of morbidity and mortality. Please activate Code PE by paging [phone]. Critical Value/emergent results were called by telephone at the time of interpretation on 04/24/2019 at 8:55 pm to Dr. Julianne Rice , who verbally acknowledged these results. Electronically Signed   By: Donavan Foil M.D.   On: 04/24/2019 20:56   Dg Chest Port 1 View  Result Date: 04/24/2019 CLINICAL DATA:  Shortness of breath EXAM: PORTABLE CHEST 1  VIEW COMPARISON:  None. FINDINGS: The heart size and mediastinal contours are within normal limits. Both lungs are clear. The visualized skeletal structures are unremarkable. IMPRESSION: No acute abnormality of the lungs in AP portable projection. Electronically Signed   By: Eddie Candle M.D.   On: 04/24/2019 16:15    Time Spent in minutes  25   Nyara Capell M.D on 04/25/2019 at 11:43 AM  Between 7am to 7pm - Pager - [phone]  After 7pm go to [email or web address] - password [facility]  [facility] -  Office  [phone]

## 2019-04-26 DIAGNOSIS — E669 Obesity, unspecified: Secondary | ICD-10-CM

## 2019-04-26 DIAGNOSIS — G4733 Obstructive sleep apnea (adult) (pediatric): Secondary | ICD-10-CM

## 2019-04-26 DIAGNOSIS — E0865 Diabetes mellitus due to underlying condition with hyperglycemia: Secondary | ICD-10-CM

## 2019-04-26 DIAGNOSIS — E1165 Type 2 diabetes mellitus with hyperglycemia: Secondary | ICD-10-CM | POA: Diagnosis present

## 2019-04-26 LAB — CBC
HCT: 41.5 % (ref 39.0–52.0)
Hemoglobin: 14 g/dL (ref 13.0–17.0)
MCH: 27.3 pg (ref 26.0–34.0)
MCHC: 33.7 g/dL (ref 30.0–36.0)
MCV: 80.9 fL (ref 80.0–100.0)
Platelets: 175 10*3/uL (ref 150–400)
RBC: 5.13 MIL/uL (ref 4.22–5.81)
RDW: 13.3 % (ref 11.5–15.5)
WBC: 6 10*3/uL (ref 4.0–10.5)
nRBC: 0 % (ref 0.0–0.2)

## 2019-04-26 LAB — HEPARIN LEVEL (UNFRACTIONATED): Heparin Unfractionated: 0.48 IU/mL (ref 0.30–0.70)

## 2019-04-26 LAB — HOMOCYSTEINE: Homocysteine: 11.3 umol/L (ref 0.0–14.5)

## 2019-04-26 LAB — GLUCOSE, CAPILLARY
Glucose-Capillary: 163 mg/dL — ABNORMAL HIGH (ref 70–99)
Glucose-Capillary: 173 mg/dL — ABNORMAL HIGH (ref 70–99)

## 2019-04-26 MED ORDER — APIXABAN 5 MG PO TABS: 10.0000 mg | ORAL_TABLET | Freq: Two times a day (BID) | ORAL | Status: DC

## 2019-04-26 MED ORDER — APIXABAN 5 MG PO TABS: 5.0000 mg | ORAL_TABLET | Freq: Two times a day (BID) | ORAL | Status: DC

## 2019-04-26 MED ORDER — CALCIUM CARBONATE ANTACID 500 MG PO CHEW
2.0000 | CHEWABLE_TABLET | Freq: Once | ORAL | Status: AC
  Administered 2019-04-26: 400 mg via ORAL
  Filled 2019-04-26: qty 2

## 2019-04-26 MED ORDER — APIXABAN 5 MG PO TABS
10.0000 mg | ORAL_TABLET | Freq: Once | ORAL | Status: AC
  Administered 2019-04-26: 10 mg via ORAL
  Filled 2019-04-26: qty 2

## 2019-04-26 MED ORDER — ELIQUIS 5 MG VTE STARTER PACK: ORAL_TABLET | ORAL | 3 refills | Status: DC

## 2019-04-26 MED ORDER — APIXABAN 5 MG PO TABS: 5.0000 mg | ORAL_TABLET | Freq: Two times a day (BID) | ORAL | 3 refills | Status: DC

## 2019-04-26 NOTE — Discharge Summary (Signed)
Physician Discharge Summary  Steven [name] FGH:[id number] DOB: 1971-06-26 DOA: 04/24/2019  PCP: Shirline Frees, MD  Admit date: 04/24/2019 Discharge date: 04/26/2019  Admitted From: Home Disposition: Home  Recommendations for Outpatient Follow-up:  Follow-up with PCP in 1-2 weeks. Patient needs to be on anticoagulation (being discharged on Eliquis) for at least 6 months.  Please follow hypercoagulable work-up sent upon admission. Please discuss need for insulin for tighter blood glucose control.  Home Health: None Equipment/Devices: None  Discharge Condition: Fair CODE STATUS: Full code Diet recommendation: Carb Modified    Discharge Diagnoses:  Principal Problem:   Bilateral pulmonary embolism (HCC)   Active Problems:   Asthma   Anxiety   OSA (obstructive sleep apnea)   HLD (hyperlipidemia)   Diabetes mellitus due to underlying condition, uncontrolled, with hyperglycemia, without long-term current use of insulin (HCC)   Obesity (BMI 30-39.9)  Brief narrative/HPI Please refer to admission H&P for details, in brief,48 year old obese male with history of diabetes mellitus (on Janumet), poorly controlled, hypertension, OSA on CPAP, anxiety, asthma and hyperlipidemia presented with 3 days of increasing shortness of breath associated with mild dry cough, chest tightness, no fevers, chills or chest pain.  Denies any sedentary lifestyle or recent travel.  Denies any calf swelling or tenderness. In the ED he was tachycardic, tachypneic with O2 sat of 92-91% on room air.  Stable blood pressure and afebrile.  He had elevated d-dimer and CT angiogram of the chest showing bilateral PE with right heart screening. Patient tested negative for COVID-19.  Admitted to telemetry for further management.   Principal Problem:   Bilateral pulmonary embolism ([place]) Findings of right heart strain on CT.  No clear underlying etiology.  No prior history or family history of blood clots. Placed on  IV heparin drip.  2D echo with normal EF and no right heart strain.  No wall motion abnormality. Doppler lower extremity negative for DVT.  Patient hemodynamically stable, maintaining sats on room air with stable heart rate.  Discussed multiple anticoagulation option with risks and benefits and patient prefers to be on Eliquis.  Patient started on Eliquis today.  I provided detailed instructions about the medication, safety measures with blood thinning and monitoring for any signs or symptoms of bleeding while on anticoagulation.  He needs to be treated for at least 6 months and then determine further duration of treatment. Hypercoagulable work-up ordered on admission which can be followed as outpatient.  Active Problems: Diabetes mellitus type 2 with hyperglycemia, uncontrolled (HCC) A1c of >10.  Only on Janumet.  Patient reports that lately he has not been following a strict diet regimen. Reports that his A1c has been as good as 8 in the past.  Instructed on strict diet regimen, exercise and tight blood glucose control.  I have discussed with him that if his blood glucose are not well controlled he would need to be on insulin in near future.  He agrees to follow-up with his PCP after discharge.  Asthma Stable.  Continue home inhaler  Anxiety Continue PRN Xanax  Obstructive sleep apnea Resume CPAP if echo does not show any right heart strain.  Hyperlipidemia Continue fibrillate   Patient stable to be discharged home with outpatient follow-up.   Code Status : Full code  Family Communication  : None  Disposition Plan  : None    Consults  : None  Procedures  : CT angiogram, 2D echo, Doppler lower extremity  Discharge Instructions   Allergies as of 04/26/2019  Reactions   Contrast Media [iodinated Diagnostic Agents] Hives   As a 48 year old experienced hives and nausea      Medication List    TAKE these medications   acetaminophen 500 MG  tablet Commonly known as: TYLENOL Take 1,000 mg by mouth every 6 (six) hours as needed for mild pain.   albuterol 108 (90 Base) MCG/ACT inhaler Commonly known as: VENTOLIN HFA Inhale 2 puffs into the lungs every 4 (four) hours as needed for wheezing.   ALPRAZolam 0.25 MG tablet Commonly known as: XANAX Take 0.25 mg by mouth daily as needed for anxiety or sleep.   apixaban 5 MG Tabs tablet Commonly known as: Eliquis Take 1 tablet (5 mg total) by mouth 2 (two) times daily. Take 2 tablets (10 mg ) twice a day  for 7 days followed by 1 tablet(5mg ) twice a day.   fenofibrate 160 MG tablet Take 160 mg by mouth daily.   ibuprofen 200 MG tablet Commonly known as: ADVIL Take 400 mg by mouth every 6 (six) hours as needed for moderate pain.   Janumet 50-1000 MG tablet Generic drug: sitaGLIPtin-metformin Take 1 tablet by mouth 2 (two) times a day.   losartan 50 MG tablet Commonly known as: COZAAR Take 50 mg by mouth daily.   tetrahydrozoline-zinc 0.05-0.25 % ophthalmic solution Commonly known as: VISINE-AC Place 2 drops into both eyes as needed (allergy).      Follow-up Information    Shirline Frees, MD. Schedule an appointment as soon as possible for a visit in 1 week(s).   Specialty: Family Medicine Contact information: [place] [address] [phone]          Allergies  Allergen Reactions  . Contrast Media [Iodinated Diagnostic Agents] Hives    As a 48 year old experienced hives and nausea     Procedures/Studies: Ct Angio Chest Pe W And/or Wo Contrast  Result Date: 04/24/2019 CLINICAL DATA:  Shortness of breath with exertion EXAM: CT ANGIOGRAPHY CHEST WITH CONTRAST TECHNIQUE: Multidetector CT imaging of the chest was performed using the standard protocol during bolus administration of intravenous contrast. Multiplanar CT image reconstructions and MIPs were obtained to evaluate the vascular anatomy. CONTRAST:  132mL OMNIPAQUE IOHEXOL  350 MG/ML SOLN COMPARISON:  Chest x-ray 04/24/2019 FINDINGS: Cardiovascular: Satisfactory opacification of the pulmonary arteries to the segmental level. Moderate to large filling defect within the right pulmonary artery with fairly extensive thrombus in the right upper lobe pulmonary vessels. Thrombus extension into the right inter lobar artery and multiple segmental and subsegmental right middle lobe and right lower lobe pulmonary branch vessels. Thrombus within the distal left pulmonary artery with extension of filling defect into left upper lobe segmental and subsegmental branch vessels. Acute thrombus within left lower lobe segmental and subsegmental branch vessels. RV LV ratio is elevated at 1.0. Heart size is normal. No pericardial effusion. Mediastinum/Nodes: No enlarged mediastinal, hilar, or axillary lymph nodes. Thyroid gland, trachea, and esophagus demonstrate no significant findings. Lungs/Pleura: Lungs are clear. No pleural effusion or pneumothorax. Upper Abdomen: Hepatic steatosis.  No acute abnormality. Musculoskeletal: No chest wall abnormality. No acute or significant osseous findings. Review of the MIP images confirms the above findings. IMPRESSION: 1. Positive for acute bilateral right greater than left pulmonary emboli. Positive for acute PE with CT evidence of right heart strain (RV/LV Ratio = 1.0) consistent with at least submassive (intermediate risk) PE. The presence of right heart strain has been associated with an increased risk of  morbidity and mortality. Please activate Code PE by paging [phone]. Critical Value/emergent results were called by telephone at the time of interpretation on 04/24/2019 at 8:55 pm to Dr. Julianne Rice , who verbally acknowledged these results. Electronically Signed   By: Donavan Foil M.D.   On: 04/24/2019 20:56   Dg Chest Port 1 View  Result Date: 04/24/2019 CLINICAL DATA:  Shortness of breath EXAM: PORTABLE CHEST 1 VIEW COMPARISON:  None. FINDINGS:  The heart size and mediastinal contours are within normal limits. Both lungs are clear. The visualized skeletal structures are unremarkable. IMPRESSION: No acute abnormality of the lungs in AP portable projection. Electronically Signed   By: Eddie Candle M.D.   On: 04/24/2019 16:15   Vas [place] Lower Extremity Venous (dvt)  Result Date: 04/25/2019  Lower Venous Study Indications: Pulmonary embolism.  Comparison Study: No prior study on file for comparison Performing Technologist: Sharion Dove RVS  Examination Guidelines: A complete evaluation includes B-mode imaging, spectral Doppler, color Doppler, and power Doppler as needed of all accessible portions of each vessel. Bilateral testing is considered an integral part of a complete examination. Limited examinations for reoccurring indications may be performed as noted.  +---------+---------------+---------+-----------+----------+-------+ RIGHT    CompressibilityPhasicitySpontaneityPropertiesSummary +---------+---------------+---------+-----------+----------+-------+ CFV      Full           Yes      Yes                          +---------+---------------+---------+-----------+----------+-------+ SFJ      Full                                                 +---------+---------------+---------+-----------+----------+-------+ FV Prox  Full                                                 +---------+---------------+---------+-----------+----------+-------+ FV Mid   Full                                                 +---------+---------------+---------+-----------+----------+-------+ FV DistalFull                                                 +---------+---------------+---------+-----------+----------+-------+ PFV      Full                                                 +---------+---------------+---------+-----------+----------+-------+ POP      Full           Yes      Yes                           +---------+---------------+---------+-----------+----------+-------+ PTV      Full                                                 +---------+---------------+---------+-----------+----------+-------+  PERO     Full                                                 +---------+---------------+---------+-----------+----------+-------+   +---------+---------------+---------+-----------+----------+-------+ LEFT     CompressibilityPhasicitySpontaneityPropertiesSummary +---------+---------------+---------+-----------+----------+-------+ CFV      Full           Yes      Yes                          +---------+---------------+---------+-----------+----------+-------+ SFJ      Full                                                 +---------+---------------+---------+-----------+----------+-------+ FV Prox  Full                                                 +---------+---------------+---------+-----------+----------+-------+ FV Mid   Full                                                 +---------+---------------+---------+-----------+----------+-------+ FV DistalFull                                                 +---------+---------------+---------+-----------+----------+-------+ PFV      Full                                                 +---------+---------------+---------+-----------+----------+-------+ POP      Full           Yes      Yes                          +---------+---------------+---------+-----------+----------+-------+ PTV      Full                                                 +---------+---------------+---------+-----------+----------+-------+ PERO     Full                                                 +---------+---------------+---------+-----------+----------+-------+     Summary: Right: There is no evidence of deep vein thrombosis in the lower extremity. Left: There is no evidence of deep vein thrombosis in the lower  extremity.  *See table(s) above for measurements and observations. Electronically signed by Ruta Hinds MD on 04/25/2019 at 5:59:51 PM.  Final     (Echo, Carotid, EGD, Colonoscopy, ERCP)    Subjective:   Discharge Exam: Vitals:   04/26/19 0732 04/26/19 0800  BP:  (!) 130/94  Pulse:  94  Resp: 13   Temp:  (!) 97.5 F (36.4 C)  SpO2:  95%   Vitals:   04/25/19 2146 04/25/19 2338 04/26/19 0732 04/26/19 0800  BP: (!) 143/98 124/90  (!) 130/94  Pulse: 92 91  94  Resp:   13   Temp: 97.9 F (36.6 C) 98.3 F (36.8 C)  (!) 97.5 F (36.4 C)  TempSrc: Oral Oral  Oral  SpO2:  97%  95%  Weight:      Height:        General: Middle-aged male not in distress HEENT: Moist mucosa, supple neck Chest: Clear bilaterally CVs: Normal S1-S2, no murmurs GI: Soft, nondistended, nontender Musculoskeletal: Warm, no edema    The results of significant diagnostics from this hospitalization (including imaging, microbiology, ancillary and laboratory) are listed below for reference.     Microbiology: Recent Results (from the past 240 hour(s))  SARS Coronavirus 2 (Performed in [place] hospital lab)     Status: None   Collection Time: 04/24/19  9:50 PM   Specimen: Nasopharyngeal Swab  Result Value Ref Range Status   SARS Coronavirus 2 NEGATIVE NEGATIVE Final    Comment: (NOTE) If result is NEGATIVE SARS-CoV-2 target nucleic acids are NOT DETECTED. The SARS-CoV-2 RNA is generally detectable in upper and lower  respiratory specimens during the acute phase of infection. The lowest  concentration of SARS-CoV-2 viral copies this assay can detect is 250  copies / mL. A negative result does not preclude SARS-CoV-2 infection  and should not be used as the sole basis for treatment or other  patient management decisions.  A negative result may occur with  improper specimen collection / handling, submission of specimen other  than nasopharyngeal swab, presence of viral mutation(s) within the   areas targeted by this assay, and inadequate number of viral copies  (<250 copies / mL). A negative result must be combined with clinical  observations, patient history, and epidemiological information. If result is POSITIVE SARS-CoV-2 target nucleic acids are DETECTED. The SARS-CoV-2 RNA is generally detectable in upper and lower  respiratory specimens dur ing the acute phase of infection.  Positive  results are indicative of active infection with SARS-CoV-2.  Clinical  correlation with patient history and other diagnostic information is  necessary to determine patient infection status.  Positive results do  not rule out bacterial infection or co-infection with other viruses. If result is PRESUMPTIVE POSTIVE SARS-CoV-2 nucleic acids MAY BE PRESENT.   A presumptive positive result was obtained on the submitted specimen  and confirmed on repeat testing.  While 2019 novel coronavirus  (SARS-CoV-2) nucleic acids may be present in the submitted sample  additional confirmatory testing may be necessary for epidemiological  and / or clinical management purposes  to differentiate between  SARS-CoV-2 and other Sarbecovirus currently known to infect humans.  If clinically indicated additional testing with an alternate test  methodology [phone]) is advised. The SARS-CoV-2 RNA is generally  detectable in upper and lower respiratory sp ecimens during the acute  phase of infection. The expected result is Negative. Fact Sheet for Patients:  StrictlyIdeas.no Fact Sheet for Healthcare Providers: BankingDealers.co.za This test is not yet approved or cleared by the [place] FDA and has been authorized for detection and/or diagnosis of SARS-CoV-2 by FDA under an Emergency  Use Authorization (EUA).  This EUA will remain in effect (meaning this test can be used) for the duration of the COVID-19 declaration under Section 564(b)(1) of the Act, 21  U.S.C. section 360bbb-3(b)(1), unless the authorization is terminated or revoked sooner. Performed at [facility], [address] [place], [place] [address]      Labs: BNP (last 3 results) Recent Labs    04/24/19 1537  BNP 45.8   Basic Metabolic Panel: Recent Labs  Lab 04/24/19 1539 04/25/19 0255  NA 137 137  K 4.3 4.0  CL 104 104  CO2 19* 21*  GLUCOSE 244* 233*  BUN 15 15  CREATININE 1.07 1.00  CALCIUM 9.4 9.3   Liver Function Tests: Recent Labs  Lab 04/24/19 1539  AST 18  ALT 29  ALKPHOS 51  BILITOT 1.2  PROT 7.6  ALBUMIN 4.5   No results for input(s): LIPASE, AMYLASE in the last 168 hours. No results for input(s): AMMONIA in the last 168 hours. CBC: Recent Labs  Lab 04/24/19 1539 04/25/19 0255 04/26/19 0535  WBC 8.5 7.7 6.0  NEUTROABS 5.9  --   --   HGB 15.6 15.3 14.0  HCT 46.3 44.2 41.5  MCV 80.1 78.6* 80.9  PLT 188 214 175   Cardiac Enzymes: No results for input(s): CKTOTAL, CKMB, CKMBINDEX, TROPONINI in the last 168 hours. BNP: Invalid input(s): POCBNP CBG: Recent Labs  Lab 04/25/19 1138 04/25/19 1654 04/25/19 2140 04/26/19 0801 04/26/19 1113  GLUCAP 226* 218* 194* 163* 173*   D-Dimer Recent Labs    04/24/19 1539  DDIMER 2.20*   Hgb A1c Recent Labs    04/25/19 0255  HGBA1C 10.1*   Lipid Profile No results for input(s): CHOL, HDL, LDLCALC, TRIG, CHOLHDL, LDLDIRECT in the last 72 hours. Thyroid function studies No results for input(s): TSH, T4TOTAL, T3FREE, THYROIDAB in the last 72 hours.  Invalid input(s): FREET3 Anemia work up No results for input(s): VITAMINB12, FOLATE, FERRITIN, TIBC, IRON, RETICCTPCT in the last 72 hours. Urinalysis No results found for: COLORURINE, APPEARANCEUR, [place], [place], [place], [place], [place], [place], PROTEINUR, UROBILINOGEN, NITRITE, LEUKOCYTESUR Sepsis Labs Invalid input(s): PROCALCITONIN,  WBC,  LACTICIDVEN Microbiology Recent Results (from the past 240 hour(s))   SARS Coronavirus 2 (Performed in [place] hospital lab)     Status: None   Collection Time: 04/24/19  9:50 PM   Specimen: Nasopharyngeal Swab  Result Value Ref Range Status   SARS Coronavirus 2 NEGATIVE NEGATIVE Final    Comment: (NOTE) If result is NEGATIVE SARS-CoV-2 target nucleic acids are NOT DETECTED. The SARS-CoV-2 RNA is generally detectable in upper and lower  respiratory specimens during the acute phase of infection. The lowest  concentration of SARS-CoV-2 viral copies this assay can detect is 250  copies / mL. A negative result does not preclude SARS-CoV-2 infection  and should not be used as the sole basis for treatment or other  patient management decisions.  A negative result may occur with  improper specimen collection / handling, submission of specimen other  than nasopharyngeal swab, presence of viral mutation(s) within the  areas targeted by this assay, and inadequate number of viral copies  (<250 copies / mL). A negative result must be combined with clinical  observations, patient history, and epidemiological information. If result is POSITIVE SARS-CoV-2 target nucleic acids are DETECTED. The SARS-CoV-2 RNA is generally detectable in upper and lower  respiratory specimens dur ing the acute phase of infection.  Positive  results are indicative of active infection  with SARS-CoV-2.  Clinical  correlation with patient history and other diagnostic information is  necessary to determine patient infection status.  Positive results do  not rule out bacterial infection or co-infection with other viruses. If result is PRESUMPTIVE POSTIVE SARS-CoV-2 nucleic acids MAY BE PRESENT.   A presumptive positive result was obtained on the submitted specimen  and confirmed on repeat testing.  While 2019 novel coronavirus  (SARS-CoV-2) nucleic acids may be present in the submitted sample  additional confirmatory testing may be necessary for epidemiological  and / or clinical  management purposes  to differentiate between  SARS-CoV-2 and other Sarbecovirus currently known to infect humans.  If clinically indicated additional testing with an alternate test  methodology [phone]) is advised. The SARS-CoV-2 RNA is generally  detectable in upper and lower respiratory sp ecimens during the acute  phase of infection. The expected result is Negative. Fact Sheet for Patients:  StrictlyIdeas.no Fact Sheet for Healthcare Providers: BankingDealers.co.za This test is not yet approved or cleared by the [place] FDA and has been authorized for detection and/or diagnosis of SARS-CoV-2 by FDA under an Emergency Use Authorization (EUA).  This EUA will remain in effect (meaning this test can be used) for the duration of the COVID-19 declaration under Section 564(b)(1) of the Act, 21 U.S.C. section 360bbb-3(b)(1), unless the authorization is terminated or revoked sooner. Performed at [facility], [address] [place], [place] [address]      Time coordinating discharge: 35 minutes  SIGNED:   Louellen Molder, MD  [facility] 04/26/2019, 11:44 AM Pager   If 7PM-7AM, please contact night-coverage [email or web address] Password [email or web address]

## 2019-04-26 NOTE — Progress Notes (Signed)
ANTICOAGULATION CONSULT NOTE - Follow-Up Consult  Pharmacy Consult for Heparin >> Apixaban Indication: pulmonary embolus  Patient Measurements: Height: 5\' 10"  (177.8 cm) Weight: 220 lb (99.8 kg) IBW/kg (Calculated) : 73 Heparin Dosing Weight: 94kg  Vital Signs: Temp: 97.5 F (36.4 C) (07/28 0800) Temp Source: Oral (07/28 0800) BP: 130/94 (07/28 0800) Pulse Rate: 94 (07/28 0800)  Labs: Recent Labs    04/24/19 1537  04/24/19 1539  04/25/19 0255 04/25/19 1039 04/25/19 1752 04/26/19 0535  HGB  --    < > 15.6  --  15.3  --   --  14.0  HCT  --   --  46.3  --  44.2  --   --  41.5  PLT  --   --  188  --  214  --   --  175  HEPARINUNFRC  --   --   --    < > <0.10* 0.29* 0.41 0.48  CREATININE  --   --  1.07  --  1.00  --   --   --   TROPONINIHS 6  --   --   --   --   --   --   --    < > = values in this interval not displayed.    Estimated Creatinine Clearance: 108.1 mL/min (by C-G formula based on SCr of 1 mg/dL).   Assessment: 40 YOM who presented on 7/27 with SOB and found to have B/L PE. The patient was not on [facility] PTA. Pharmacy consulted to dose Heparin initially - now with plans to transition to Apixaban.  The patient remain therapeutic on their heparin drip (HL 0.48) - will transition to Apixaban.   Goal of Therapy:  Heparin level 0.3-0.7 units/ml Monitor platelets by anticoagulation protocol: Yes   Plan:  - D/c Heparin - Start Apixaban 10 mg bid x 7d followed by 5 mg bid - Education sheet placed in AVS - Will plan to educate prior to discharge - Will monitor for bleeding and necessary dose adjustments  Thank you for allowing pharmacy to be a part of this patient's care.  Alycia Rossetti, PharmD, BCPS Clinical Pharmacist Clinical phone for 04/26/2019: [phone] 04/26/2019 11:15 AM   **Pharmacist phone directory can now be found on [email or web address] (PW [email or web address]).  Listed under [place].

## 2019-04-26 NOTE — Discharge Instructions (Addendum)
Information on my medicine - ELIQUIS (apixaban)  This medication education was reviewed with me or my healthcare representative as part of my discharge preparation.    Why was Eliquis prescribed for you? Eliquis was prescribed to treat blood clots that may have been found in the veins of your legs (deep vein thrombosis) or in your lungs (pulmonary embolism) and to reduce the risk of them occurring again.  What do You need to know about Eliquis ? The starting dose is 10 mg (two 5 mg tablets) taken TWICE daily for the FIRST SEVEN (7) DAYS, then on 05/03/19 the dose is reduced to ONE 5 mg tablet taken TWICE daily.  Eliquis may be taken with or without food.   Try to take the dose about the same time in the morning and in the evening. If you have difficulty swallowing the tablet whole please discuss with your pharmacist how to take the medication safely.  Take Eliquis exactly as prescribed and DO NOT stop taking Eliquis without talking to the doctor who prescribed the medication.  Stopping may increase your risk of developing a new blood clot.  Refill your prescription before you run out.  After discharge, you should have regular check-up appointments with your healthcare provider that is prescribing your Eliquis.    What do you do if you miss a dose? If a dose of ELIQUIS is not taken at the scheduled time, take it as soon as possible on the same day and twice-daily administration should be resumed. The dose should not be doubled to make up for a missed dose.  Important Safety Information A possible side effect of Eliquis is bleeding. You should call your healthcare provider right away if you experience any of the following: ? Bleeding from an injury or your nose that does not stop. ? Unusual colored urine (red or dark brown) or unusual colored stools (red or black). ? Unusual bruising for unknown reasons. ? A serious fall or if you hit your head (even if there is no bleeding).  Some  medicines may interact with Eliquis and might increase your risk of bleeding or clotting while on Eliquis. To help avoid this, consult your healthcare provider or pharmacist prior to using any new prescription or non-prescription medications, including herbals, vitamins, non-steroidal anti-inflammatory drugs (NSAIDs) and supplements.  This website has more information on Eliquis (apixaban): http://www.eliquis.com/eliquis/home    Pulmonary Embolism  A pulmonary embolism (PE) is a sudden blockage or decrease of blood flow in one or both lungs. Most blockages come from a blood clot that forms in the vein of a lower leg, thigh, or arm (deep vein thrombosis, DVT) and travels to the lungs. A clot is blood that has thickened into a gel or solid. PE is a dangerous and life-threatening condition that needs to be treated right away. What are the causes? This condition is usually caused by a blood clot that forms in a vein and moves to the lungs. In rare cases, it may be caused by air, fat, part of a tumor, or other tissue that moves through the veins and into the lungs. What increases the risk? The following factors may make you more likely to develop this condition:  Experiencing a traumatic injury, such as breaking a hip or leg.  Having: ? A spinal cord injury. ? Orthopedic surgery, especially hip or knee replacement. ? Any major surgery. ? A stroke. ? DVT. ? Blood clots or blood clotting disease. ? Long-term (chronic) lung or heart disease. ?  Cancer treated with chemotherapy. ? A central venous catheter.  Taking medicines that contain estrogen. These include birth control pills and hormone replacement therapy.  Being: ? Pregnant. ? In the period of time after your baby is delivered (postpartum). ? Older than age 25. ? Overweight. ? A smoker, especially if you have other risks. What are the signs or symptoms? Symptoms of this condition usually start suddenly and include:  Shortness  of breath during activity or at rest.  Coughing, coughing up blood, or coughing up blood-tinged mucus.  Chest pain that is often worse with deep breaths.  Rapid or irregular heartbeat.  Feeling light-headed or dizzy.  Fainting.  Feeling anxious.  Fever.  Sweating.  Pain and swelling in a leg. This is a symptom of DVT, which can lead to PE. How is this diagnosed? This condition may be diagnosed based on:  Your medical history.  A physical exam.  Blood tests.  CT pulmonary angiogram. This test checks blood flow in and around your lungs.  Ventilation-perfusion scan, also called a lung VQ scan. This test measures air flow and blood flow to the lungs.  An ultrasound of the legs. How is this treated? Treatment for this condition depends on many factors, such as the cause of your PE, your risk for bleeding or developing more clots, and other medical conditions you have. Treatment aims to remove, dissolve, or stop blood clots from forming or growing larger. Treatment may include:  Medicines, such as: ? Blood thinning medicines (anticoagulants) to stop clots from forming. ? Medicines that dissolve clots (thrombolytics).  Procedures, such as: ? Using a flexible tube to remove a blood clot (embolectomy) or to deliver medicine to destroy it (catheter-directed thrombolysis). ? Inserting a filter into a large vein that carries blood to the heart (inferior vena cava). This filter (vena cava filter) catches blood clots before they reach the lungs. ? Surgery to remove the clot (surgical embolectomy). This is rare. You may need a combination of immediate, long-term (up to 3 months after diagnosis), and extended (more than 3 months after diagnosis) treatments. Your treatment may continue for several months (maintenance therapy). You and your health care provider will work together to choose the treatment program that is best for you. Follow these instructions at home: Medicines  Take  over-the-counter and prescription medicines only as told by your health care provider.  If you are taking an anticoagulant medicine: ? Take the medicine every day at the same time each day. ? Understand what foods and drugs interact with your medicine. ? Understand the side effects of this medicine, including excessive bruising or bleeding. Ask your health care provider or pharmacist about other side effects. General instructions  Wear a medical alert bracelet or carry a medical alert card that says you have had a PE and lists what medicines you take.  Ask your health care provider when you may return to your normal activities. Avoid sitting or lying for a long time without moving.  Maintain a healthy weight. Ask your health care provider what weight is healthy for you.  Do not use any products that contain nicotine or tobacco, such as cigarettes, e-cigarettes, and chewing tobacco. If you need help quitting, ask your health care provider.  Talk with your health care provider about any travel plans. It is important to make sure that you are still able to take your medicine while on trips.  Keep all follow-up visits as told by your health care provider. This is important.  Contact a health care provider if:  You missed a dose of your blood thinner medicine. Get help right away if:  You have: ? New or increased pain, swelling, warmth, or redness in an arm or leg. ? Numbness or tingling in an arm or leg. ? Shortness of breath during activity or at rest. ? A fever. ? Chest pain. ? A rapid or irregular heartbeat. ? A severe headache. ? Vision changes. ? A serious fall or accident, or you hit your head. ? Stomach (abdominal) pain. ? Blood in your vomit, stool, or urine. ? A cut that will not stop bleeding.  You cough up blood.  You feel light-headed or dizzy.  You cannot move your arms or legs.  You are confused or have memory loss. These symptoms may represent a serious problem  that is an emergency. Do not wait to see if the symptoms will go away. Get medical help right away. Call your local emergency services (911 in the [place] Do not drive yourself to the hospital. Summary  A pulmonary embolism (PE) is a sudden blockage or decrease of blood flow in one or both lungs. PE is a dangerous and life-threatening condition that needs to be treated right away.  Treatments for this condition usually include medicines to thin your blood (anticoagulants) or medicines to break apart blood clots (thrombolytics).  If you are given blood thinners, it is important to take the medicine every day at the same time each day.  Understand what foods and drugs interact with any medicines that you are taking.  If you have signs of PE or DVT, call your local emergency services (911 in the [place] This information is not intended to replace advice given to you by your health care provider. Make sure you discuss any questions you have with your health care provider. Document Released: 09/12/2000 Document Revised: 06/23/2018 Document Reviewed: 06/23/2018 Elsevier Patient Education  2020 [employer].

## 2019-04-27 LAB — CARDIOLIPIN ANTIBODIES, IGG, IGM, IGA
Anticardiolipin IgA: <9 U/mL (ref 0–11)
Anticardiolipin IgG: <9 GPL U/mL (ref 0–14)
Anticardiolipin IgM: <9 [MPL'U]/mL (ref 0–12)

## 2019-04-27 LAB — BETA-2-GLYCOPROTEIN I ABS, IGG/M/A
Beta-2 Glyco I IgG: <9 GPI IgG units (ref 0–20)
Beta-2-Glycoprotein I IgA: <9 GPI IgA units (ref 0–25)
Beta-2-Glycoprotein I IgM: <9 GPI IgM units (ref 0–32)

## 2019-04-27 LAB — PROTEIN C ACTIVITY: Protein C Activity: 139 % (ref 73–180)

## 2019-04-27 LAB — PROTEIN C, TOTAL: Protein C, Total: 128 % (ref 60–150)

## 2019-04-27 LAB — PROTEIN S ACTIVITY: Protein S Activity: 73 % (ref 63–140)

## 2019-04-27 LAB — PROTEIN S, TOTAL: Protein S Ag, Total: 107 % (ref 60–150)

## 2019-04-28 LAB — DRVVT MIX: dRVVT Mix: 39.3 s (ref 0.0–47.0)

## 2019-04-28 LAB — LUPUS ANTICOAGULANT PANEL
DRVVT: 47.4 s — ABNORMAL HIGH (ref 0.0–47.0)
PTT Lupus Anticoagulant: 55.1 s — ABNORMAL HIGH (ref 0.0–51.9)

## 2019-04-28 LAB — PTT-LA MIX: PTT-LA Mix: 47.6 s (ref 0.0–48.9)

## 2019-04-28 LAB — HEXAGONAL PHASE PHOSPHOLIPID: Hexagonal Phase Phospholipid: 0 s (ref 0–11)

## 2019-04-28 LAB — PTT-LA INCUB MIX: PTT-LA Incub Mix: 64.4 s — ABNORMAL HIGH (ref 0.0–48.9)

## 2019-05-02 LAB — PROTHROMBIN GENE MUTATION

## 2019-05-04 LAB — FACTOR 5 LEIDEN

## 2019-05-06 NOTE — ED Provider Notes (Addendum)
[facility] Provider Note   CSN: [id number] Arrival date & time: 04/24/19  1511    History   Chief Complaint Chief Complaint  Patient presents with  . Shortness of Breath    HPI Steven [name] is a 48 y.o. male.     HPI Patient with 3 days of shortness of breath especially with exertion.  States he was going upstairs when his symptoms suddenly started.  Has a history of anxiety and asthma and thinks this has been contributing to his symptoms.  He denies any chest pain.  No recent extended travel or immobilization.  No fever or chills.  Patient has had mild nonproductive cough.  No known COVID-19 exposures. Past Medical History:  Diagnosis Date  . Anxiety   . Asthma   . Diabetes mellitus without complication ([place])   . HLD (hyperlipidemia)   . OSA (obstructive sleep apnea)     Patient Active Problem List   Diagnosis Date Noted  . Diabetes mellitus due to underlying condition, uncontrolled, with hyperglycemia, without long-term current use of insulin ([place]) 04/26/2019  . Obesity (BMI 30-39.9) 04/26/2019  . Asthma   . Anxiety   . OSA (obstructive sleep apnea)   . HLD (hyperlipidemia)   . Bilateral pulmonary embolism ([place]) 04/24/2019    History reviewed. No pertinent surgical history.      Home Medications    Prior to Admission medications   Medication Sig Start Date End Date Taking? Authorizing Provider  acetaminophen (TYLENOL) 500 MG tablet Take 1,000 mg by mouth every 6 (six) hours as needed for mild pain.   Yes [provider]  albuterol (VENTOLIN HFA) 108 (90 Base) MCG/ACT inhaler Inhale 2 puffs into the lungs every 4 (four) hours as needed for wheezing. 12/29/18  Yes [provider]  ALPRAZolam Duanne Moron) 0.25 MG tablet Take 0.25 mg by mouth daily as needed for anxiety or sleep.   Yes [provider]  fenofibrate 160 MG tablet Take 160 mg by mouth daily. 02/23/19  Yes [provider]  ibuprofen (ADVIL) 200  MG tablet Take 400 mg by mouth every 6 (six) hours as needed for moderate pain.   Yes [provider]  JANUMET 50-1000 MG tablet Take 1 tablet by mouth 2 (two) times a day. 02/24/19  Yes [provider]  losartan (COZAAR) 50 MG tablet Take 50 mg by mouth daily. 02/23/19  Yes [provider]  tetrahydrozoline-zinc (VISINE-AC) 0.05-0.25 % ophthalmic solution Place 2 drops into both eyes as needed (allergy).   Yes [provider]  apixaban (ELIQUIS) 5 MG TABS tablet Take 1 tablet (5 mg total) by mouth 2 (two) times daily. Take 2 tablets (10 mg ) twice a day  for 7 days followed by 1 tablet(5mg ) twice a day. 04/26/19   Dhungel, Flonnie Overman, MD    Family History Family History  Problem Relation Age of Onset  . Diabetes Mellitus II Mother   . Thrombophlebitis Father     Social History Social History   Tobacco Use  . Smoking status: Never Smoker  . Smokeless tobacco: Never Used  Substance Use Topics  . Alcohol use: Not Currently    Frequency: Never  . Drug use: Never     Allergies   Contrast media [iodinated diagnostic agents]   Review of Systems Review of Systems  Constitutional: Negative for chills and fever.  HENT: Negative for sore throat and trouble swallowing.   Eyes: Negative for visual disturbance.  Respiratory: Positive for  cough, shortness of breath and wheezing.   Cardiovascular: Negative for chest pain, palpitations and leg swelling.  Gastrointestinal: Negative for abdominal pain, diarrhea, nausea and vomiting.  Musculoskeletal: Negative for back pain, myalgias and neck pain.  Skin: Negative for rash and wound.  Neurological: Negative for dizziness, weakness, light-headedness, numbness and headaches.  Psychiatric/Behavioral: The patient is nervous/anxious.   All other systems reviewed and are negative.    Physical Exam Updated Vital Signs BP (!) 130/94   Pulse 94   Temp (!) 97.5 F (36.4 C) (Oral)   Resp 13   Ht 5\' 10"  (1.778 m)    Wt 99.8 kg   SpO2 95%   BMI 31.57 kg/m   Physical Exam Vitals signs and nursing note reviewed.  Constitutional:      Appearance: He is well-developed.     Comments: Anxious appearing  HENT:     Head: Normocephalic and atraumatic.     Nose: Nose normal.     Mouth/Throat:     Mouth: Mucous membranes are moist.  Eyes:     Pupils: Pupils are equal, round, and reactive to light.  Neck:     Musculoskeletal: Normal range of motion and neck supple. No neck rigidity or muscular tenderness.     Vascular: No carotid bruit.  Cardiovascular:     Rate and Rhythm: Regular rhythm. Tachycardia present.     Heart sounds: No murmur. No friction rub. No gallop.   Pulmonary:     Effort: Pulmonary effort is normal. No respiratory distress.     Breath sounds: Normal breath sounds. No stridor. No wheezing, rhonchi or rales.  Chest:     Chest wall: No tenderness.  Abdominal:     General: Bowel sounds are normal.     Palpations: Abdomen is soft.     Tenderness: There is no abdominal tenderness. There is no guarding or rebound.  Musculoskeletal: Normal range of motion.        General: No swelling, tenderness, deformity or signs of injury.     Right lower leg: No edema.     Left lower leg: No edema.     Comments: Distal pulses intact.  Lymphadenopathy:     Cervical: No cervical adenopathy.  Skin:    General: Skin is warm and dry.     Findings: No erythema or rash.  Neurological:     General: No focal deficit present.     Mental Status: He is alert and oriented to person, place, and time.  Psychiatric:        Behavior: Behavior normal.      ED Treatments / Results  Labs (all labs ordered are listed, but only abnormal results are displayed) Labs Reviewed  COMPREHENSIVE METABOLIC PANEL - Abnormal; Notable for the following components:      Result Value   CO2 19 (*)    Glucose, Bld 244 (*)    All other components within normal limits  D-DIMER, QUANTITATIVE (NOT AT [facility]) - Abnormal;  Notable for the following components:   D-Dimer, Quant 2.20 (*)    All other components within normal limits  HEPARIN LEVEL (UNFRACTIONATED) - Abnormal; Notable for the following components:   Heparin Unfractionated <0.10 (*)    All other components within normal limits  CBC - Abnormal; Notable for the following components:   MCV 78.6 (*)    All other components within normal limits  HEMOGLOBIN A1C - Abnormal; Notable for the following components:   Hgb A1c MFr Bld 10.1 (*)  All other components within normal limits  BASIC METABOLIC PANEL - Abnormal; Notable for the following components:   CO2 21 (*)    Glucose, Bld 233 (*)    All other components within normal limits  LUPUS ANTICOAGULANT PANEL - Abnormal; Notable for the following components:   PTT Lupus Anticoagulant 55.1 (*)    DRVVT 47.4 (*)    All other components within normal limits  GLUCOSE, CAPILLARY - Abnormal; Notable for the following components:   Glucose-Capillary 214 (*)    All other components within normal limits  HEPARIN LEVEL (UNFRACTIONATED) - Abnormal; Notable for the following components:   Heparin Unfractionated 0.29 (*)    All other components within normal limits  GLUCOSE, CAPILLARY - Abnormal; Notable for the following components:   Glucose-Capillary 193 (*)    All other components within normal limits  GLUCOSE, CAPILLARY - Abnormal; Notable for the following components:   Glucose-Capillary 226 (*)    All other components within normal limits  GLUCOSE, CAPILLARY - Abnormal; Notable for the following components:   Glucose-Capillary 218 (*)    All other components within normal limits  GLUCOSE, CAPILLARY - Abnormal; Notable for the following components:   Glucose-Capillary 194 (*)    All other components within normal limits  GLUCOSE, CAPILLARY - Abnormal; Notable for the following components:   Glucose-Capillary 163 (*)    All other components within normal limits  GLUCOSE, CAPILLARY - Abnormal;  Notable for the following components:   Glucose-Capillary 173 (*)    All other components within normal limits  PTT-LA INCUB MIX - Abnormal; Notable for the following components:   PTT-LA Incub Mix 64.4 (*)    All other components within normal limits  SARS CORONAVIRUS 2 (HOSPITAL ORDER, [facility])  CBC WITH DIFFERENTIAL/PLATELET  BRAIN NATRIURETIC PEPTIDE  HIV ANTIBODY (ROUTINE TESTING W REFLEX)  ANTITHROMBIN III  PROTEIN C ACTIVITY  PROTEIN C, TOTAL  PROTEIN S ACTIVITY  PROTEIN S, TOTAL  BETA-2-GLYCOPROTEIN I ABS, IGG/M/A  HOMOCYSTEINE  FACTOR 5 LEIDEN  PROTHROMBIN GENE MUTATION  CARDIOLIPIN ANTIBODIES, IGG, IGM, IGA  HEPARIN LEVEL (UNFRACTIONATED)  HEPARIN LEVEL (UNFRACTIONATED)  CBC  PTT-LA MIX  HEXAGONAL PHASE PHOSPHOLIPID  DRVVT MIX  TROPONIN I (HIGH SENSITIVITY)    EKG EKG Interpretation  Date/Time:  Sunday April 24 2019 15:22:24 EDT Ventricular Rate:  117 PR Interval:    QRS Duration: 78 QT Interval:  331 QTC Calculation: 462 R Axis:   3 Text Interpretation:  Sinus tachycardia Anterior infarct, old Nonspecific T abnormalities, lateral leads no prior ekg Confirmed by Julianne Rice [phone]) on 04/24/2019 3:38:22 PM   Radiology No results found.  Procedures Procedures (including critical care time)  Medications Ordered in ED Medications  hydrocortisone sodium succinate (SOLU-CORTEF) 100 MG injection (has no administration in time range)  hydrocortisone sodium succinate (SOLU-CORTEF) injection 200 mg (200 mg Intravenous Given 04/24/19 1630)  diphenhydrAMINE (BENADRYL) capsule 50 mg ( Oral See Alternative 04/24/19 1922)    Or  diphenhydrAMINE (BENADRYL) injection 50 mg (50 mg Intravenous Given 04/24/19 1922)  iohexol (OMNIPAQUE) 350 MG/ML injection 100 mL (100 mLs Intravenous Contrast Given 04/24/19 2023)  heparin bolus via infusion 5,500 Units (5,500 Units Intravenous Bolus from Bag 04/24/19 2132)  calcium carbonate (TUMS - dosed in  mg elemental calcium) chewable tablet 400 mg of elemental calcium (400 mg of elemental calcium Oral Given 04/25/19 0129)  sodium chloride 0.9 % bolus 500 mL (0 mLs Intravenous Stopping Infusion hung by another clincian 04/25/19 1900)  heparin bolus via infusion 3,000 Units (3,000 Units Intravenous Bolus from Bag 04/25/19 0356)  heparin bolus via infusion 1,500 Units (1,500 Units Intravenous Bolus from Bag 04/25/19 1213)  calcium carbonate (TUMS - dosed in mg elemental calcium) chewable tablet 400 mg of elemental calcium (400 mg of elemental calcium Oral Given 04/26/19 0427)  apixaban (ELIQUIS) tablet 10 mg (10 mg Oral Given 04/26/19 1122)    CRITICAL CARE Performed by: Julianne Rice Total critical care time: 60 minutes Critical care time was exclusive of separately billable procedures and treating other patients. Critical care was necessary to treat or prevent imminent or life-threatening deterioration. Critical care was time spent personally by me on the following activities: development of treatment plan with patient and/or surrogate as well as nursing, discussions with consultants, evaluation of patient's response to treatment, examination of patient, obtaining history from patient or surrogate, ordering and performing treatments and interventions, ordering and review of laboratory studies, ordering and review of radiographic studies, pulse oximetry and re-evaluation of patient's condition. Initial Impression / Assessment and Plan / ED Course  I have reviewed the triage vital signs and the nursing notes.  Pertinent labs & imaging results that were available during my care of the patient were reviewed by me and considered in my medical decision making (see chart for details).        Work-up for shortness of breath in the emergency department.  Patient had elevated d-dimer.  Has known allergy to IV medication.  States he broke out in a rash.  Was given 4-hour pretreatment prior to CT angio.  No  reactions with the IV contrast.  CT did show bilateral pulmonary emboli.  Discussed with hospitalist will accept patient in transfer.  Initiated heparin.  Patient remains hemodynamically stable.  Final Clinical Impressions(s) / ED Diagnoses   Final diagnoses:  Bilateral pulmonary embolism [facility])    ED Discharge Orders         Ordered    Eliquis DVT/PE Starter Pack (ELIQUIS STARTER PACK) 5 MG TABS  Status:  Discontinued     04/26/19 1135    apixaban (ELIQUIS) 5 MG TABS tablet  2 times daily     04/26/19 1141           Julianne Rice, MD 05/06/19 4098    Julianne Rice, MD 05/30/19 [phone]

## 2019-05-10 DIAGNOSIS — E1165 Type 2 diabetes mellitus with hyperglycemia: Secondary | ICD-10-CM | POA: Diagnosis not present

## 2019-05-10 DIAGNOSIS — I2692 Saddle embolus of pulmonary artery without acute cor pulmonale: Secondary | ICD-10-CM | POA: Diagnosis not present

## 2019-05-10 DIAGNOSIS — I10 Essential (primary) hypertension: Secondary | ICD-10-CM | POA: Diagnosis not present

## 2019-05-10 DIAGNOSIS — E7800 Pure hypercholesterolemia, unspecified: Secondary | ICD-10-CM | POA: Diagnosis not present

## 2019-05-13 DIAGNOSIS — G4733 Obstructive sleep apnea (adult) (pediatric): Secondary | ICD-10-CM | POA: Diagnosis not present

## 2019-05-24 ENCOUNTER — Inpatient Hospital Stay: Payer: Self-pay | Admitting: Nurse Practitioner

## 2019-06-13 DIAGNOSIS — G4733 Obstructive sleep apnea (adult) (pediatric): Secondary | ICD-10-CM | POA: Diagnosis not present

## 2019-07-12 DIAGNOSIS — E781 Pure hyperglyceridemia: Secondary | ICD-10-CM | POA: Diagnosis not present

## 2019-07-12 DIAGNOSIS — E7800 Pure hypercholesterolemia, unspecified: Secondary | ICD-10-CM | POA: Diagnosis not present

## 2019-07-12 DIAGNOSIS — E1165 Type 2 diabetes mellitus with hyperglycemia: Secondary | ICD-10-CM | POA: Diagnosis not present

## 2019-07-12 DIAGNOSIS — I10 Essential (primary) hypertension: Secondary | ICD-10-CM | POA: Diagnosis not present

## 2019-07-12 DIAGNOSIS — Z125 Encounter for screening for malignant neoplasm of prostate: Secondary | ICD-10-CM | POA: Diagnosis not present

## 2019-07-13 DIAGNOSIS — G4733 Obstructive sleep apnea (adult) (pediatric): Secondary | ICD-10-CM | POA: Diagnosis not present

## 2019-08-13 DIAGNOSIS — G4733 Obstructive sleep apnea (adult) (pediatric): Secondary | ICD-10-CM | POA: Diagnosis not present

## 2019-09-12 DIAGNOSIS — G4733 Obstructive sleep apnea (adult) (pediatric): Secondary | ICD-10-CM | POA: Diagnosis not present

## 2019-10-13 DIAGNOSIS — G4733 Obstructive sleep apnea (adult) (pediatric): Secondary | ICD-10-CM | POA: Diagnosis not present

## 2019-11-08 DIAGNOSIS — I10 Essential (primary) hypertension: Secondary | ICD-10-CM | POA: Diagnosis not present

## 2019-11-08 DIAGNOSIS — E7800 Pure hypercholesterolemia, unspecified: Secondary | ICD-10-CM | POA: Diagnosis not present

## 2019-11-08 DIAGNOSIS — E1165 Type 2 diabetes mellitus with hyperglycemia: Secondary | ICD-10-CM | POA: Diagnosis not present

## 2019-11-08 DIAGNOSIS — G4730 Sleep apnea, unspecified: Secondary | ICD-10-CM | POA: Diagnosis not present

## 2020-05-17 DIAGNOSIS — E119 Type 2 diabetes mellitus without complications: Secondary | ICD-10-CM | POA: Diagnosis not present

## 2020-05-17 DIAGNOSIS — Z86711 Personal history of pulmonary embolism: Secondary | ICD-10-CM | POA: Diagnosis not present

## 2020-05-17 DIAGNOSIS — I10 Essential (primary) hypertension: Secondary | ICD-10-CM | POA: Diagnosis not present

## 2020-05-17 DIAGNOSIS — E7800 Pure hypercholesterolemia, unspecified: Secondary | ICD-10-CM | POA: Diagnosis not present

## 2020-07-05 ENCOUNTER — Inpatient Hospital Stay (HOSPITAL_COMMUNITY)
Admission: EM | Admit: 2020-07-05 | Discharge: 2020-07-08 | DRG: 175 | Disposition: A | Discharge status: Home / Self Care | Payer: BC Managed Care – PPO | Attending: Internal Medicine | Admitting: Internal Medicine

## 2020-07-05 ENCOUNTER — Other Ambulatory Visit: Payer: Self-pay

## 2020-07-05 ENCOUNTER — Encounter (HOSPITAL_COMMUNITY): Payer: Self-pay

## 2020-07-05 ENCOUNTER — Emergency Department (HOSPITAL_COMMUNITY): Payer: BC Managed Care – PPO

## 2020-07-05 DIAGNOSIS — R000 Tachycardia, unspecified: Secondary | ICD-10-CM | POA: Diagnosis not present

## 2020-07-05 DIAGNOSIS — J45909 Unspecified asthma, uncomplicated: Secondary | ICD-10-CM | POA: Diagnosis present

## 2020-07-05 DIAGNOSIS — I959 Hypotension, unspecified: Secondary | ICD-10-CM | POA: Diagnosis not present

## 2020-07-05 DIAGNOSIS — R0602 Shortness of breath: Secondary | ICD-10-CM | POA: Diagnosis not present

## 2020-07-05 DIAGNOSIS — N179 Acute kidney failure, unspecified: Secondary | ICD-10-CM | POA: Diagnosis not present

## 2020-07-05 DIAGNOSIS — Z91041 Radiographic dye allergy status: Secondary | ICD-10-CM

## 2020-07-05 DIAGNOSIS — I471 Supraventricular tachycardia: Secondary | ICD-10-CM | POA: Diagnosis not present

## 2020-07-05 DIAGNOSIS — Z86718 Personal history of other venous thrombosis and embolism: Secondary | ICD-10-CM

## 2020-07-05 DIAGNOSIS — Z20822 Contact with and (suspected) exposure to covid-19: Secondary | ICD-10-CM | POA: Diagnosis not present

## 2020-07-05 DIAGNOSIS — I2602 Saddle embolus of pulmonary artery with acute cor pulmonale: Secondary | ICD-10-CM | POA: Diagnosis not present

## 2020-07-05 DIAGNOSIS — J9601 Acute respiratory failure with hypoxia: Secondary | ICD-10-CM | POA: Diagnosis not present

## 2020-07-05 DIAGNOSIS — Z86711 Personal history of pulmonary embolism: Secondary | ICD-10-CM | POA: Diagnosis not present

## 2020-07-05 DIAGNOSIS — Z791 Long term (current) use of non-steroidal anti-inflammatories (NSAID): Secondary | ICD-10-CM

## 2020-07-05 DIAGNOSIS — I2609 Other pulmonary embolism with acute cor pulmonale: Secondary | ICD-10-CM | POA: Diagnosis not present

## 2020-07-05 DIAGNOSIS — E875 Hyperkalemia: Secondary | ICD-10-CM | POA: Diagnosis not present

## 2020-07-05 DIAGNOSIS — R0902 Hypoxemia: Secondary | ICD-10-CM | POA: Diagnosis not present

## 2020-07-05 DIAGNOSIS — R739 Hyperglycemia, unspecified: Secondary | ICD-10-CM | POA: Diagnosis not present

## 2020-07-05 DIAGNOSIS — R519 Headache, unspecified: Secondary | ICD-10-CM | POA: Diagnosis present

## 2020-07-05 DIAGNOSIS — R748 Abnormal levels of other serum enzymes: Secondary | ICD-10-CM | POA: Diagnosis present

## 2020-07-05 DIAGNOSIS — Z79899 Other long term (current) drug therapy: Secondary | ICD-10-CM | POA: Diagnosis not present

## 2020-07-05 DIAGNOSIS — G4733 Obstructive sleep apnea (adult) (pediatric): Secondary | ICD-10-CM | POA: Diagnosis not present

## 2020-07-05 DIAGNOSIS — I2699 Other pulmonary embolism without acute cor pulmonale: Secondary | ICD-10-CM | POA: Diagnosis not present

## 2020-07-05 DIAGNOSIS — E785 Hyperlipidemia, unspecified: Secondary | ICD-10-CM | POA: Diagnosis not present

## 2020-07-05 DIAGNOSIS — E0865 Diabetes mellitus due to underlying condition with hyperglycemia: Secondary | ICD-10-CM

## 2020-07-05 DIAGNOSIS — E1165 Type 2 diabetes mellitus with hyperglycemia: Secondary | ICD-10-CM | POA: Diagnosis not present

## 2020-07-05 DIAGNOSIS — R079 Chest pain, unspecified: Secondary | ICD-10-CM | POA: Diagnosis not present

## 2020-07-05 DIAGNOSIS — I10 Essential (primary) hypertension: Secondary | ICD-10-CM | POA: Diagnosis present

## 2020-07-05 DIAGNOSIS — Z833 Family history of diabetes mellitus: Secondary | ICD-10-CM | POA: Diagnosis not present

## 2020-07-05 DIAGNOSIS — R55 Syncope and collapse: Secondary | ICD-10-CM | POA: Diagnosis not present

## 2020-07-05 DIAGNOSIS — Z7901 Long term (current) use of anticoagulants: Secondary | ICD-10-CM | POA: Diagnosis not present

## 2020-07-05 LAB — COMPREHENSIVE METABOLIC PANEL
ALT: 48 U/L — ABNORMAL HIGH (ref 0–44)
AST: 80 U/L — ABNORMAL HIGH (ref 15–41)
Albumin: 3.9 g/dL (ref 3.5–5.0)
Alkaline Phosphatase: 62 U/L (ref 38–126)
Anion gap: 15 (ref 5–15)
BUN: 19 mg/dL (ref 6–20)
CO2: 19 mmol/L — ABNORMAL LOW (ref 22–32)
Calcium: 9.1 mg/dL (ref 8.9–10.3)
Chloride: 103 mmol/L (ref 98–111)
Creatinine, Ser: 1.29 mg/dL — ABNORMAL HIGH (ref 0.61–1.24)
GFR calc non Af Amer: >60 mL/min (ref >60)
Glucose, Bld: 409 mg/dL — ABNORMAL HIGH (ref 70–99)
Potassium: 5.7 mmol/L — ABNORMAL HIGH (ref 3.5–5.1)
Sodium: 137 mmol/L (ref 135–145)
Total Bilirubin: 2.1 mg/dL — ABNORMAL HIGH (ref 0.3–1.2)
Total Protein: 6.5 g/dL (ref 6.5–8.1)

## 2020-07-05 LAB — TROPONIN I (HIGH SENSITIVITY)
Troponin I (High Sensitivity): 24 ng/L — ABNORMAL HIGH (ref <18)
Troponin I (High Sensitivity): 68 ng/L — ABNORMAL HIGH (ref <18)

## 2020-07-05 LAB — CBC WITH DIFFERENTIAL/PLATELET
Abs Immature Granulocytes: 0.04 10*3/uL (ref 0.00–0.07)
Basophils Absolute: 0 10*3/uL (ref 0.0–0.1)
Basophils Relative: 0 %
Eosinophils Absolute: 0.3 10*3/uL (ref 0.0–0.5)
Eosinophils Relative: 3 %
HCT: 47.6 % (ref 39.0–52.0)
Hemoglobin: 15.6 g/dL (ref 13.0–17.0)
Immature Granulocytes: 0 %
Lymphocytes Relative: 19 %
Lymphs Abs: 2.3 10*3/uL (ref 0.7–4.0)
MCH: 26.9 pg (ref 26.0–34.0)
MCHC: 32.8 g/dL (ref 30.0–36.0)
MCV: 82.2 fL (ref 80.0–100.0)
Monocytes Absolute: 0.6 10*3/uL (ref 0.1–1.0)
Monocytes Relative: 5 %
Neutro Abs: 8.6 10*3/uL — ABNORMAL HIGH (ref 1.7–7.7)
Neutrophils Relative %: 73 %
Platelets: 191 10*3/uL (ref 150–400)
RBC: 5.79 MIL/uL (ref 4.22–5.81)
RDW: 13.3 % (ref 11.5–15.5)
WBC: 11.9 10*3/uL — ABNORMAL HIGH (ref 4.0–10.5)
nRBC: 0 % (ref 0.0–0.2)

## 2020-07-05 LAB — RESP PANEL BY RT PCR (RSV, FLU A&B, COVID)
Influenza A by PCR: NEGATIVE
Influenza B by PCR: NEGATIVE
Respiratory Syncytial Virus by PCR: NEGATIVE
SARS Coronavirus 2 by RT PCR: NEGATIVE

## 2020-07-05 LAB — GLUCOSE, CAPILLARY: Glucose-Capillary: 229 mg/dL — ABNORMAL HIGH (ref 70–99)

## 2020-07-05 MED ORDER — SODIUM CHLORIDE 0.9 % IV BOLUS
1000.0000 mL | Freq: Once | INTRAVENOUS | Status: AC
  Administered 2020-07-05: 1000 mL via INTRAVENOUS

## 2020-07-05 MED ORDER — INSULIN ASPART 100 UNIT/ML ~~LOC~~ SOLN
0.0000 [IU] | Freq: Three times a day (TID) | SUBCUTANEOUS | Status: DC
  Administered 2020-07-06: 2 [IU] via SUBCUTANEOUS
  Administered 2020-07-06: 8 [IU] via SUBCUTANEOUS
  Administered 2020-07-06: 5 [IU] via SUBCUTANEOUS
  Administered 2020-07-07: 2 [IU] via SUBCUTANEOUS
  Administered 2020-07-07: 5 [IU] via SUBCUTANEOUS
  Administered 2020-07-07: 3 [IU] via SUBCUTANEOUS
  Administered 2020-07-08: 2 [IU] via SUBCUTANEOUS

## 2020-07-05 MED ORDER — SODIUM ZIRCONIUM CYCLOSILICATE 10 G PO PACK
10.0000 g | PACK | Freq: Once | ORAL | Status: AC
  Administered 2020-07-06: 10 g via ORAL
  Filled 2020-07-05: qty 1

## 2020-07-05 MED ORDER — HYDROCORTISONE NA SUCCINATE PF 250 MG IJ SOLR
200.0000 mg | Freq: Once | INTRAMUSCULAR | Status: AC
  Administered 2020-07-05: 200 mg via INTRAVENOUS
  Filled 2020-07-05: qty 200

## 2020-07-05 MED ORDER — POLYETHYLENE GLYCOL 3350 17 G PO PACK: 17.0000 g | PACK | Freq: Every day | ORAL | Status: DC | PRN

## 2020-07-05 MED ORDER — DIPHENHYDRAMINE HCL 50 MG/ML IJ SOLN
50.0000 mg | Freq: Once | INTRAMUSCULAR | Status: AC
  Filled 2020-07-05: qty 1

## 2020-07-05 MED ORDER — ALTEPLASE (PULMONARY EMBOLISM) INFUSION
50.0000 mg | Freq: Once | INTRAVENOUS | Status: AC
  Administered 2020-07-05: 50 mg via INTRAVENOUS
  Filled 2020-07-05: qty 50

## 2020-07-05 MED ORDER — DIPHENHYDRAMINE HCL 25 MG PO CAPS
50.0000 mg | ORAL_CAPSULE | Freq: Once | ORAL | Status: AC
  Administered 2020-07-05: 50 mg via ORAL
  Filled 2020-07-05: qty 2

## 2020-07-05 MED ORDER — DOCUSATE SODIUM 100 MG PO CAPS: 100.0000 mg | ORAL_CAPSULE | Freq: Two times a day (BID) | ORAL | Status: DC | PRN

## 2020-07-05 MED ORDER — SODIUM CHLORIDE 0.9 % IV SOLN
250.0000 mL | Freq: Once | INTRAVENOUS | Status: AC
  Administered 2020-07-05: 250 mL via INTRAVENOUS

## 2020-07-05 MED ORDER — HEPARIN (PORCINE) 25000 UT/250ML-% IV SOLN
2000.0000 [IU]/h | INTRAVENOUS | Status: AC
  Administered 2020-07-05: 1500 [IU]/h via INTRAVENOUS
  Administered 2020-07-06: 2000 [IU]/h via INTRAVENOUS
  Administered 2020-07-06: 1800 [IU]/h via INTRAVENOUS
  Administered 2020-07-07 – 2020-07-08 (×2): 2000 [IU]/h via INTRAVENOUS
  Filled 2020-07-05 (×5): qty 250

## 2020-07-05 MED ORDER — HEPARIN BOLUS VIA INFUSION
5000.0000 [IU] | Freq: Once | INTRAVENOUS | Status: AC
  Administered 2020-07-05: 5000 [IU] via INTRAVENOUS
  Filled 2020-07-05: qty 5000

## 2020-07-05 MED ORDER — IOHEXOL 350 MG/ML SOLN
75.0000 mL | Freq: Once | INTRAVENOUS | Status: AC | PRN
  Administered 2020-07-05: 75 mL via INTRAVENOUS

## 2020-07-05 NOTE — ED Notes (Signed)
Patient transported to CT 

## 2020-07-05 NOTE — Progress Notes (Signed)
ANTICOAGULATION CONSULT NOTE - Initial Consult  Pharmacy Consult for heparin gtt Indication: pulmonary embolus rule out  Allergies  Allergen Reactions  . Contrast Media [Iodinated Diagnostic Agents] Hives    As a 49 year old experienced hives and nausea    Patient Measurements: Height: 5\' 9"  (175.3 cm) Weight: 99.8 kg (220 lb) IBW/kg (Calculated) : 70.7 Heparin Dosing Weight: 91kg  Vital Signs: Temp: 98 F (36.7 C) (10/07 1125) Temp Source: Oral (10/07 1125) BP: 96/71 (10/07 1430) Pulse Rate: 125 (10/07 1430)  Labs: Recent Labs    07/05/20 1145 07/05/20 1351  HGB 15.6  --   HCT 47.6  --   PLT 191  --   CREATININE 1.29*  --   TROPONINIHS 24* 68*    Estimated Creatinine Clearance: 81.5 mL/min (A) (by C-G formula based on SCr of 1.29 mg/dL (H)).   Medical History: Past Medical History:  Diagnosis Date  . Anxiety   . Asthma   . Diabetes mellitus without complication ([place])   . HLD (hyperlipidemia)   . OSA (obstructive sleep apnea)     Assessment: 49 year old male with PMH significant for past PE in 2020. He was previously taking Eliquis 5mg  BID starting on 04/26/2019 for a 75month course. Pt is currently not taking Eliquis. Pt states his symptoms are similar to when he had his previous PE.   CBC Hgb 15.6; Plt 191  Goal of Therapy:  Heparin level 0.3-0.7 units/ml Monitor platelets by anticoagulation protocol: Yes   Plan:  Heparin 5000 unit bolus IV x1  Heparin infusion 1500 units/hr Monitor s/sx bleeding, imaging, CBC, and HL  6h HL at [facility], PharmD PGY1 Pharmacy Resident 07/05/2020 4:00 PM

## 2020-07-05 NOTE — ED Provider Notes (Signed)
Care assumed from Martinique Robinson, [place]. See her note for full H&P  Per her note, "Steven [name] is a 49 y.o. male past medical history of spontaneous pulmonary embolism, presenting to the emergency department with complaint of sudden onset of shortness of breath that began this morning.  He states began shortly after he woke up, he had gone downstairs to get a drink, when he went back upstairs to begin working at his desk, he had sudden onset of shortness of breath.  He felt like he is "drowning outside of water."  He states his symptoms feel exactly like he did when he had PE last year, however he never had a syncopal episode like he did today.  He states he was sitting on the bed feeling short of breath, then felt lightheaded and had a syncopal episode.  He is having some discomfort with breathing though no real pain with breathing.  He has had constant shortness of breath since that time.  He feels much better on oxygen supplementation, currently on 4 L nasal cannula.  He has not taken anticoagulation since February or March of this year, discontinued by PCP.  He states they never found a cause for his PE.  He may have some soreness to his left calf that feels like a pulled muscle.  The history is provided by the patient and medical records. "  Physical Exam  BP 111/85   Pulse (!) 123   Temp 98 F (36.7 C) (Oral)   Resp (!) 21   Ht 5\' 9"  (1.753 m)   Wt 99.8 kg   SpO2 97%   BMI 32.49 kg/m   Physical Exam Constitutional:      General: He is not in acute distress.    Appearance: He is well-developed. He is ill-appearing.  Eyes:     Conjunctiva/sclera: Conjunctivae normal.  Cardiovascular:     Rate and Rhythm: Regular rhythm. Tachycardia present.  Pulmonary:     Effort: Tachypnea present.  Skin:    General: Skin is warm and dry.  Neurological:     Mental Status: He is alert and oriented to person, place, and time.  Psychiatric:        Mood and Affect: Mood normal.       ED  Course/Procedures   Clinical Course as of Jul 05 2016  Thu Jul 05, 2020  1135 S1Q3T3?  EKG 12-Lead [JR]    Clinical Course User Index [JR] Robinson, [place] N, PA-C    Procedures  Results for orders placed or performed during the hospital encounter of 07/05/20  Resp Panel by RT PCR (RSV, Flu A&B, Covid) - Nasopharyngeal Swab   Specimen: Nasopharyngeal Swab  Result Value Ref Range   SARS Coronavirus 2 by RT PCR NEGATIVE NEGATIVE   Influenza A by PCR NEGATIVE NEGATIVE   Influenza B by PCR NEGATIVE NEGATIVE   Respiratory Syncytial Virus by PCR NEGATIVE NEGATIVE  CBC with Differential  Result Value Ref Range   WBC 11.9 (H) 4.0 - 10.5 K/uL   RBC 5.79 4.22 - 5.81 MIL/uL   Hemoglobin 15.6 13.0 - 17.0 g/dL   HCT 47.6 39 - 52 %   MCV 82.2 80.0 - 100.0 fL   MCH 26.9 26.0 - 34.0 pg   MCHC 32.8 30.0 - 36.0 g/dL   RDW 13.3 11.5 - 15.5 %   Platelets 191 150 - 400 K/uL   nRBC 0.0 0.0 - 0.2 %   Neutrophils Relative % 73 %   Neutro  Abs 8.6 (H) 1.7 - 7.7 K/uL   Lymphocytes Relative 19 %   Lymphs Abs 2.3 0.7 - 4.0 K/uL   Monocytes Relative 5 %   Monocytes Absolute 0.6 0.1 - 1.0 K/uL   Eosinophils Relative 3 %   Eosinophils Absolute 0.3 0 - 0 K/uL   Basophils Relative 0 %   Basophils Absolute 0.0 0 - 0 K/uL   Immature Granulocytes 0 %   Abs Immature Granulocytes 0.04 0.00 - 0.07 K/uL  Comprehensive metabolic panel  Result Value Ref Range   Sodium 137 135 - 145 mmol/L   Potassium 5.7 (H) 3.5 - 5.1 mmol/L   Chloride 103 98 - 111 mmol/L   CO2 19 (L) 22 - 32 mmol/L   Glucose, Bld 409 (H) 70 - 99 mg/dL   BUN 19 6 - 20 mg/dL   Creatinine, Ser 1.29 (H) 0.61 - 1.24 mg/dL   Calcium 9.1 8.9 - 10.3 mg/dL   Total Protein 6.5 6.5 - 8.1 g/dL   Albumin 3.9 3.5 - 5.0 g/dL   AST 80 (H) 15 - 41 U/L   ALT 48 (H) 0 - 44 U/L   Alkaline Phosphatase 62 38 - 126 U/L   Total Bilirubin 2.1 (H) 0.3 - 1.2 mg/dL   GFR calc non Af Amer >60 >60 mL/min   Anion gap 15 5 - 15  Troponin I (High Sensitivity)   Result Value Ref Range   Troponin I (High Sensitivity) 24 (H) <18 ng/L  Troponin I (High Sensitivity)  Result Value Ref Range   Troponin I (High Sensitivity) 68 (H) <18 ng/L   DG Chest 2 View  Result Date: 07/05/2020 CLINICAL DATA:  Chest pain EXAM: CHEST - 2 VIEW COMPARISON:  04/24/2019 FINDINGS: The heart size and mediastinal contours are within normal limits. Both lungs are clear. Probable enchondroma within the left humeral head. The visualized skeletal structures are otherwise unremarkable. IMPRESSION: No active cardiopulmonary disease. Electronically Signed   By: Davina Poke D.O.   On: 07/05/2020 12:21   CT Angio Chest PE W/Cm &/Or Wo Cm  Result Date: 07/05/2020 CLINICAL DATA:  49 year old male with concern for pulmonary embolism. EXAM: CT ANGIOGRAPHY CHEST WITH CONTRAST TECHNIQUE: Multidetector CT imaging of the chest was performed using the standard protocol during bolus administration of intravenous contrast. Multiplanar CT image reconstructions and MIPs were obtained to evaluate the vascular anatomy. CONTRAST:  10mL OMNIPAQUE IOHEXOL 350 MG/ML SOLN COMPARISON:  Chest CT dated 04/24/2019. FINDINGS: Cardiovascular: There is no cardiomegaly or pericardial effusion. There is dilatation of the right heart chambers. The right ventricular lumen measures approximately 4.6 cm in diameter and the left ventricular lumen measures 2.2 cm with RV/LV ratio of 2.1. The thoracic aorta is unremarkable. Large bilateral pulmonary artery emboli involving the right main pulmonary artery extending into the lobar and segmental branches of the right upper and right lower lobes. Left upper and left lower lobe pulmonary artery emboli involving the lobar and segmental branches. Mediastinum/Nodes: There is no hilar or mediastinal adenopathy. The esophagus and the thyroid gland are grossly unremarkable. No mediastinal fluid collection. Lungs/Pleura: There is heterogeneous hazy density throughout the lungs, likely  related to atelectasis or areas of air trapping. No consolidative changes. There is no pleural effusion pneumothorax. The central airways are patent. Upper Abdomen: Probable fatty liver. Musculoskeletal: Degenerative changes of the spine. No acute osseous pathology. Review of the MIP images confirms the above findings. IMPRESSION: Positive for acute PE with of right heart strain (RV/LV Ratio = 2.1)  consistent with at least submassive (intermediate risk) PE. The presence of right heart strain has been associated with an increased risk of morbidity and mortality. These results were called by telephone at the time of interpretation on 07/05/2020 at 7:18 pm to provider Marvia Pickles, who verbally acknowledged these results. Electronically Signed   By: Anner Crete M.D.   On: 07/05/2020 19:20   CRITICAL CARE Performed by: Rodney Booze   Total critical care time: 40 minutes  Critical care time was exclusive of separately billable procedures and treating other patients.  Critical care was necessary to treat or prevent imminent or life-threatening deterioration.  Critical care was time spent personally by me on the following activities: development of treatment plan with patient and/or surrogate as well as nursing, discussions with consultants, evaluation of patient's response to treatment, examination of patient, obtaining history from patient or surrogate, ordering and performing treatments and interventions, ordering and review of laboratory studies, ordering and review of radiographic studies, pulse oximetry and re-evaluation of patient's condition.   MDM   Briefly, 49 year old male with a history of VTE not currently anticoagulated presents to the emergency department today for evaluation of shortness of breath, chest pain and syncope that started suddenly prior to arrival.  Not hypoxic but tachypneic and requiring O2 for comfort.  Reviewed/interpreted labs CBC with mild leukocytosis, no  anemia CMP with elevated Potassium elevated at 5.7, bicarb low at 19, elevated blood glucose at 409, elevated creatinine at 1.2, liver enzymes elevated, total bili elevated to 2.1 troponins elevated and uptrending covid neg  EKG with sinus/ectopic atrial tachycardia, left axis deviation, low voltage precordial leads, abnormal inferior qwaves, anteroseptal infarct  imaging reviewed/interpreted -  CXR  - No active cardiopulmonary disease.  CTA chest pending on shift change. Due to increased concern for PE given tachycardia, tachypnea, elevated trop, ekg changes concerning for PE, and concern for delay in dx due to contrast allergy - heparin ordered prior to CTA.  CTA chest reviewed/interpreted -  Positive for acute PE with of right heart strain (RV/LV Ratio = 2.1) consistent with at least submassive (intermediate risk) PE. The presence of right heart strain has been associated with an increased risk of morbidity and mortality.  8:13 PM CONSULT with Dr. Lucile Shutters with critical care who will see patient in the ED.   Bishop Dublin 07/05/20 2017    Quintella Reichert, MD 07/06/20 2013

## 2020-07-05 NOTE — Progress Notes (Signed)
[facility] Progress Note Patient Name: Steven [name] DOB: [birth year] MRN: [id number]   Date of Service  07/05/2020  HPI/Events of Note  Patient admitted with submassive PE by clinical and imaging criteria admitted to the ICU for RX with TPA followed by heparin anticoagulation. He is also Troponin positive.  eICU Interventions  New Patient Evaluation completed.        Kerry Kass Demontrae Gilbert 07/05/2020, 10:51 PM

## 2020-07-05 NOTE — ED Triage Notes (Signed)
Pt arrived via EMS from home reporting an acute episode of sob that started this morning when walking up the stairs. Pt reports having some chest pressure at this time. Pt states he laid down in bed and then had a syncopal episode witnessed by his wife. BGL was 360 per EMS. Pt states these symptoms are similar to when he had blood clots in his lungs last year.

## 2020-07-05 NOTE — Plan of Care (Signed)

## 2020-07-05 NOTE — ED Provider Notes (Signed)
[facility] EMERGENCY DEPARTMENT Provider Note   CSN: [id number] Arrival date & time: 07/05/20  1120     History Chief Complaint  Patient presents with  . Shortness of Breath  . Loss of Consciousness    Steven [name] is a 49 y.o. male past medical history of spontaneous pulmonary embolism, presenting to the emergency department with complaint of sudden onset of shortness of breath that began this morning.  He states began shortly after he woke up, he had gone downstairs to get a drink, when he went back upstairs to begin working at his desk, he had sudden onset of shortness of breath.  He felt like he is "drowning outside of water."  He states his symptoms feel exactly like he did when he had PE last year, however he never had a syncopal episode like he did today.  He states he was sitting on the bed feeling short of breath, then felt lightheaded and had a syncopal episode.  He is having some discomfort with breathing though no real pain with breathing.  He has had constant shortness of breath since that time.  He feels much better on oxygen supplementation, currently on 4 L nasal cannula.  He has not taken anticoagulation since February or March of this year, discontinued by PCP.  He states they never found a cause for his PE.  He may have some soreness to his left calf that feels like a pulled muscle.  The history is provided by the patient and medical records.       Past Medical History:  Diagnosis Date  . Anxiety   . Asthma   . Diabetes mellitus without complication ([place])   . HLD (hyperlipidemia)   . OSA (obstructive sleep apnea)     Patient Active Problem List   Diagnosis Date Noted  . Diabetes mellitus due to underlying condition, uncontrolled, with hyperglycemia, without long-term current use of insulin ([place]) 04/26/2019  . Obesity (BMI 30-39.9) 04/26/2019  . Asthma   . Anxiety   . OSA (obstructive sleep apnea)   . HLD (hyperlipidemia)   . Bilateral  pulmonary embolism ([place]) 04/24/2019    History reviewed. No pertinent surgical history.     Family History  Problem Relation Age of Onset  . Diabetes Mellitus II Mother   . Thrombophlebitis Father     Social History   Tobacco Use  . Smoking status: Never Smoker  . Smokeless tobacco: Never Used  Substance Use Topics  . Alcohol use: Not Currently  . Drug use: Never    Home Medications Prior to Admission medications   Medication Sig Start Date End Date Taking? Authorizing Provider  acetaminophen (TYLENOL) 500 MG tablet Take 1,000 mg by mouth every 6 (six) hours as needed for mild pain.   Yes [provider]  albuterol (VENTOLIN HFA) 108 (90 Base) MCG/ACT inhaler Inhale 2 puffs into the lungs every 4 (four) hours as needed for wheezing. 12/29/18  Yes [provider]  ALPRAZolam Duanne Moron) 0.25 MG tablet Take 0.25 mg by mouth daily as needed for anxiety or sleep.   Yes [provider]  fenofibrate 160 MG tablet Take 160 mg by mouth daily. 02/23/19  Yes [provider]  losartan (COZAAR) 50 MG tablet Take 50 mg by mouth daily. 02/23/19  Yes [provider]  meloxicam (MOBIC) 15 MG tablet Take 15 mg by mouth at bedtime.  06/02/20  Yes [provider]  tetrahydrozoline-zinc (VISINE-AC) 0.05-0.25 % ophthalmic solution Place 2  drops into both eyes as needed (allergy).   Yes [provider]  XIGDUO XR 01-999 MG TB24 Take 2 tablets by mouth daily. 06/13/20  Yes [provider]  apixaban (ELIQUIS) 5 MG TABS tablet Take 1 tablet (5 mg total) by mouth 2 (two) times daily. Take 2 tablets (10 mg ) twice a day  for 7 days followed by 1 tablet(5mg ) twice a day. Patient not taking: Reported on 07/05/2020 04/26/19   Louellen Molder, MD    Allergies    Contrast media [iodinated diagnostic agents]  Review of Systems   Review of Systems  All other systems reviewed and are negative.   Physical Exam Updated Vital Signs BP 96/71    Pulse (!) 125   Temp 98 F (36.7 C) (Oral)   Resp 15   Ht 5\' 9"  (1.753 m)   Wt 99.8 kg   SpO2 97%   BMI 32.49 kg/m   Physical Exam Vitals and nursing note reviewed.  Constitutional:      General: He is in acute distress.     Appearance: He is well-developed.  HENT:     Head: Normocephalic and atraumatic.  Eyes:     Conjunctiva/sclera: Conjunctivae normal.  Cardiovascular:     Rate and Rhythm: Regular rhythm. Tachycardia present.  Pulmonary:     Effort: Pulmonary effort is normal. Tachypnea present.     Breath sounds: Normal breath sounds.  Abdominal:     Palpations: Abdomen is soft.  Musculoskeletal:     Right lower leg: No edema.     Left lower leg: No edema.     Comments: No calf tenderness  Skin:    General: Skin is warm.  Neurological:     Mental Status: He is alert.  Psychiatric:        Behavior: Behavior normal.     ED Results / Procedures / Treatments   Labs (all labs ordered are listed, but only abnormal results are displayed) Labs Reviewed  CBC WITH DIFFERENTIAL/PLATELET - Abnormal; Notable for the following components:      Result Value   WBC 11.9 (*)    Neutro Abs 8.6 (*)    All other components within normal limits  COMPREHENSIVE METABOLIC PANEL - Abnormal; Notable for the following components:   Potassium 5.7 (*)    CO2 19 (*)    Glucose, Bld 409 (*)    Creatinine, Ser 1.29 (*)    AST 80 (*)    ALT 48 (*)    Total Bilirubin 2.1 (*)    All other components within normal limits  TROPONIN I (HIGH SENSITIVITY) - Abnormal; Notable for the following components:   Troponin I (High Sensitivity) 24 (*)    All other components within normal limits  TROPONIN I (HIGH SENSITIVITY) - Abnormal; Notable for the following components:   Troponin I (High Sensitivity) 68 (*)    All other components within normal limits  RESP PANEL BY RT PCR (RSV, FLU A&B, COVID)  HEPARIN LEVEL (UNFRACTIONATED)    EKG EKG Interpretation  Date/Time:  Thursday July 05 2020 11:26:23 EDT Ventricular Rate:  140 PR Interval:    QRS Duration: 84 QT Interval:  287 QTC Calculation: 438 R Axis:   -16 Text Interpretation: Sinus or ectopic atrial tachycardia Borderline left axis deviation Low voltage, precordial leads Abnormal inferior Q waves Probable anteroseptal infarct, old Confirmed by Sherwood Gambler [phone]) on 07/05/2020 1:12:58 PM   Radiology DG Chest 2 View  Result Date: 07/05/2020 CLINICAL DATA:  Chest pain EXAM: CHEST - 2 VIEW COMPARISON:  04/24/2019 FINDINGS: The heart size and mediastinal contours are within normal limits. Both lungs are clear. Probable enchondroma within the left humeral head. The visualized skeletal structures are otherwise unremarkable. IMPRESSION: No active cardiopulmonary disease. Electronically Signed   By: Davina Poke D.O.   On: 07/05/2020 12:21    Procedures .Critical Care Performed by: Larenda Reedy, [place] N, PA-C Authorized by: Zeb Rawl, [place] N, PA-C   Critical care provider statement:    Critical care time (minutes):  45   Critical care time was exclusive of:  Separately billable procedures and treating other patients and teaching time   Critical care was necessary to treat or prevent imminent or life-threatening deterioration of the following conditions:  Respiratory failure and cardiac failure   Critical care was time spent personally by me on the following activities:  Discussions with consultants, evaluation of patient's response to treatment, examination of patient, ordering and performing treatments and interventions, ordering and review of laboratory studies, ordering and review of radiographic studies, pulse oximetry, re-evaluation of patient's condition, obtaining history from patient or surrogate and review of old charts   I assumed direction of critical care for this patient from another provider in my specialty: no     (including critical care time)  Medications Ordered in ED Medications  diphenhydrAMINE  (BENADRYL) capsule 50 mg (has no administration in time range)    Or  diphenhydrAMINE (BENADRYL) injection 50 mg (has no administration in time range)  heparin ADULT infusion 100 units/mL (25000 units/269mL sodium chloride 0.45%) (has no administration in time range)  heparin bolus via infusion 5,000 Units (has no administration in time range)  hydrocortisone sodium succinate (SOLU-CORTEF) injection 200 mg (200 mg Intravenous Given 07/05/20 1348)    ED Course  I have reviewed the triage vital signs and the nursing notes.  Pertinent labs & imaging results that were available during my care of the patient were reviewed by me and considered in my medical decision making (see chart for details).  Clinical Course as of Jul 06 1611  Thu Jul 05, 2020  1135 S1Q3T3?  EKG 12-Lead [JR]    Clinical Course User Index [JR] Walter Min, [place] N, PA-C   MDM Rules/Calculators/A&P                          Patient with history of unprovoked PE, presenting with sudden onset of shortness of breath began this morning.  His symptoms feel exactly like he did when he had a PE last year.  He has not been on anticoagulation since February or March of this year, discontinued by PCP.  He is having no chest pain, more discomfort.  He is significantly tachycardic at 140, sinus rhythm.  Does have some Q waves and T wave inversions in lead III that are new from previous EKG.  Chest x-ray is clear.  He is tachypneic though not hypoxic, has supplemental oxygen on for comfort.  His initial troponin is slightly elevated at 24, delta troponin is 68.  Does not seem consistent with ACS, highly suspicious for PE.  Unfortunately, patient has contrast media allergy, will need premedication.  This has been initiated.  Metabolic panel is remarkable for hyperkalemia 5.7.  Hyperglycemic at 409 with normal anion gap.  Leukocytosis is mild at 11.9.    Pressures are downtrending, now 97/71. Pt consistently tachycardic, tachypneic, with  trops uptrending, PE is most likely diagnosis at this time. Discussed with  Dr. Regenia Skeeter, will order heparin. Discussed risks and benefits of heparin at this time, believe benefit outweighs risk at this time. patient consents to administration of heparin before CTA is completed.   Care assumed at shift change by PA Couture pending CTA of the chest.  Final Clinical Impression(s) / ED Diagnoses Final diagnoses:  None    Rx / DC Orders ED Discharge Orders    None       Wafaa Deemer, [place] N, PA-C 07/05/20 1612    Sherwood Gambler, MD 07/10/20 [phone]

## 2020-07-05 NOTE — H&P (Signed)
NAME:  Steven [name], MRN:  [id number], DOB:  Nov 28, 1970, LOS: 0 ADMISSION DATE:  07/05/2020, CONSULTATION DATE: 07/05/2020 REFERRING MD: Dr. Ralene Bathe, CHIEF COMPLAINT: Pulmonary embolism  Brief History   49 year old gentleman, hypotensive, syncopal episode found to have bilateral pulmonary embolism on CTA.  History of present illness   49 year old gentleman, with medical history of pulmonary embolism greater than a year ago, was on Eliquis, currently off anticoagulation.  Past medical history to include hyperlipidemia, OSA, hypertension, diabetes.  Patient had progressive symptoms of shortness of breath and dyspnea on exertion.  He was unable to help around the house.  Decision was made to seek evaluation in the ER.  Upon presentation to the ED patient was hypotensive.  He had a syncopal episode.  Systolic blood pressure was in the 60s.  At this time he has stabilized with heart rate in the 120s blood pressure systolic 921-1 10.  He is able to talk in complete sentences.  On 3 L nasal cannula O2 to maintain sats in the mid 90s.  Wife is at bedside.  Past Medical History   Past Medical History:  Diagnosis Date  . Anxiety   . Asthma   . Diabetes mellitus without complication ([place])   . HLD (hyperlipidemia)   . OSA (obstructive sleep apnea)      Significant Hospital Events    Consults:   Procedures:   Significant Diagnostic Tests:  CTA chest bilateral pulmonary embolism, evidence of right ventricular strain with an RV/LV ratio of 2.1. The patient's images have been independently reviewed by me.    Micro Data:  COVID-19 negative  Antimicrobials:    Interim history/subjective:  HPI above  Objective   Blood pressure 116/85, pulse (!) 118, temperature 98.3 F (36.8 C), temperature source Oral, resp. rate 15, height 5\' 9"  (1.753 m), weight 99.7 kg, SpO2 97 %.       No intake or output data in the 24 hours ending 07/05/20 2308 Filed Weights   07/05/20 1129 07/05/20 2218  Weight:  99.8 kg 99.7 kg    Examination: General: Middle-aged male, resting in bed, no acute distress HENT: NCAT, tracking appropriately Lungs: Clear to auscultation bilaterally no crackles no wheeze Cardiovascular: Regular rate rhythm, S1-S2 Abdomen: Soft, nontender nondistended Extremities: Scratches, no significant edema, Neuro: Alert oriented following commands GU: Deferred  Resolved Hospital Problem list     Assessment & Plan:   Acute bilateral pulmonary embolism High risk submassive PE with high risk features to include hypotension on presentation and syncope. Troponin elevation Plan: Discussed risk benefits and alternatives of proceeding with treatments to include heparin versus systemic TPA. We discussed the risk of bleeding associated with TPA administration. However due to patient's high risk presentation features as well as CT imaging decision made for TPA administration. Discussed plan with ED pharmacist. Consent was obtained from patient and patient's wife present at bedside. Restart heparin following TPA administration and follow PTT per pharmacy protocol. Admit to the intensive care unit for acute 2-hour neurochecks following TPA administration. Echo ordered, pending  Acute hypoxemic respiratory failure secondary to above Plan: Wean FiO2 to maintain SPO2 greater than 90%.  Leukocytosis Likely reactive  AKI Hyperkalemia Plan: Follow urine output repeat BMP Lokelma x1   Best practice:  Diet: Clears Pain/Anxiety/Delirium protocol (if indicated): Not applicable VAP protocol (if indicated): Not applicable DVT prophylaxis: Heparin GI prophylaxis: na Glucose control: CBGs Mobility: Bedrest Code Status: Full code Family Communication: Discussed with patient's wife at bedside Disposition: ICU  Labs  CBC: Recent Labs  Lab 07/05/20 1145  WBC 11.9*  NEUTROABS 8.6*  HGB 15.6  HCT 47.6  MCV 82.2  PLT 161    Basic Metabolic Panel: Recent Labs  Lab  07/05/20 1145  NA 137  K 5.7*  CL 103  CO2 19*  GLUCOSE 409*  BUN 19  CREATININE 1.29*  CALCIUM 9.1   GFR: Estimated Creatinine Clearance: 81.5 mL/min (A) (by C-G formula based on SCr of 1.29 mg/dL (H)). Recent Labs  Lab 07/05/20 1145  WBC 11.9*    Liver Function Tests: Recent Labs  Lab 07/05/20 1145  AST 80*  ALT 48*  ALKPHOS 62  BILITOT 2.1*  PROT 6.5  ALBUMIN 3.9   No results for input(s): LIPASE, AMYLASE in the last 168 hours. No results for input(s): AMMONIA in the last 168 hours.  ABG No results found for: PHART, PCO2ART, PO2ART, HCO3, TCO2, ACIDBASEDEF, O2SAT   Coagulation Profile: No results for input(s): INR, PROTIME in the last 168 hours.  Cardiac Enzymes: No results for input(s): CKTOTAL, CKMB, CKMBINDEX, TROPONINI in the last 168 hours.  HbA1C: Hgb A1c MFr Bld  Date/Time Value Ref Range Status  04/25/2019 02:55 AM 10.1 (H) 4.8 - 5.6 % Final    Comment:    (NOTE) Pre diabetes:          5.7%-6.4% Diabetes:              >6.4% Glycemic control for   <7.0% adults with diabetes     CBG: No results for input(s): GLUCAP in the last 168 hours.  Review of Systems:   Review of Systems  Constitutional: Negative for chills, fever, malaise/fatigue and weight loss.  HENT: Negative for hearing loss, sore throat and tinnitus.   Eyes: Negative for blurred vision and double vision.  Respiratory: Negative for cough, hemoptysis, sputum production, shortness of breath, wheezing and stridor.   Cardiovascular: Positive for chest pain and palpitations. Negative for orthopnea, leg swelling and PND.  Gastrointestinal: Negative for abdominal pain, constipation, diarrhea, heartburn, nausea and vomiting.  Genitourinary: Negative for dysuria, hematuria and urgency.  Musculoskeletal: Negative for joint pain and myalgias.  Skin: Negative for itching and rash.  Neurological: Negative for dizziness, tingling, weakness and headaches.  Endo/Heme/Allergies: Negative for  environmental allergies. Does not bruise/bleed easily.  Psychiatric/Behavioral: Negative for depression. The patient is not nervous/anxious and does not have insomnia.   All other systems reviewed and are negative.    Past Medical History  He,  has a past medical history of Anxiety, Asthma, Diabetes mellitus without complication ([place]), HLD (hyperlipidemia), and OSA (obstructive sleep apnea).   Surgical History   History reviewed. No pertinent surgical history.   Social History   reports that he has never smoked. He has never used smokeless tobacco. He reports previous alcohol use. He reports that he does not use drugs.   Family History   His family history includes Diabetes Mellitus II in his mother; Thrombophlebitis in his father.   Allergies Allergies  Allergen Reactions  . Contrast Media [Iodinated Diagnostic Agents] Hives    As a 49 year old experienced hives and nausea     Home Medications  Prior to Admission medications   Medication Sig Start Date End Date Taking? Authorizing Provider  acetaminophen (TYLENOL) 500 MG tablet Take 1,000 mg by mouth every 6 (six) hours as needed for mild pain.   Yes [provider]  albuterol (VENTOLIN HFA) 108 (90 Base) MCG/ACT inhaler Inhale 2 puffs into the lungs every  4 (four) hours as needed for wheezing. 12/29/18  Yes [provider]  ALPRAZolam Duanne Moron) 0.25 MG tablet Take 0.25 mg by mouth daily as needed for anxiety or sleep.   Yes [provider]  fenofibrate 160 MG tablet Take 160 mg by mouth daily. 02/23/19  Yes [provider]  losartan (COZAAR) 50 MG tablet Take 50 mg by mouth daily. 02/23/19  Yes [provider]  meloxicam (MOBIC) 15 MG tablet Take 15 mg by mouth at bedtime.  06/02/20  Yes [provider]  tetrahydrozoline-zinc (VISINE-AC) 0.05-0.25 % ophthalmic solution Place 2 drops into both eyes as needed (allergy).   Yes [provider]  XIGDUO XR 01-999 MG TB24 Take 2  tablets by mouth daily. 06/13/20  Yes [provider]  apixaban (ELIQUIS) 5 MG TABS tablet Take 1 tablet (5 mg total) by mouth 2 (two) times daily. Take 2 tablets (10 mg ) twice a day  for 7 days followed by 1 tablet(5mg ) twice a day. Patient not taking: Reported on 07/05/2020 04/26/19   Dhungel, Flonnie Overman, MD     This patient is critically ill with multiple organ system failure; which, requires frequent high complexity decision making, assessment, support, evaluation, and titration of therapies. This was completed through the application of advanced monitoring technologies and extensive interpretation of multiple databases. During this encounter critical care time was devoted to patient care services described in this note for 36 minutes.  Garner Nash, DO [facility] 07/05/2020 11:08 PM

## 2020-07-05 NOTE — Progress Notes (Signed)
NAME:  Steven [name], MRN:  [id number], DOB:  [birth year], LOS: 0 ADMISSION DATE:  07/05/2020, CONSULTATION DATE: 07/05/2020 REFERRING MD: Dr. Ralene Bathe, CHIEF COMPLAINT: Pulmonary embolism  Brief History   49 year old gentleman, hypotensive, syncopal episode found to have bilateral pulmonary embolism on CTA.  History of present illness   49 year old gentleman, with medical history of pulmonary embolism greater than a year ago, was on Eliquis, currently off anticoagulation.  Past medical history to include hyperlipidemia, OSA, hypertension, diabetes.  Patient had progressive symptoms of shortness of breath and dyspnea on exertion.  He was unable to help around the house.  Decision was made to seek evaluation in the ER.  Upon presentation to the ED patient was hypotensive.  He had a syncopal episode.  Systolic blood pressure was in the 60s.  At this time he has stabilized with heart rate in the 120s blood pressure systolic 626-9 10.  He is able to talk in complete sentences.  On 3 L nasal cannula O2 to maintain sats in the mid 90s.  Wife is at bedside.  Past Medical History   Past Medical History:  Diagnosis Date  . Anxiety   . Asthma   . Diabetes mellitus without complication ([place])   . HLD (hyperlipidemia)   . OSA (obstructive sleep apnea)      Significant Hospital Events    Consults:   Procedures:   Significant Diagnostic Tests:  CTA chest bilateral pulmonary embolism, evidence of right ventricular strain with an RV/LV ratio of 2.1. The patient's images have been independently reviewed by me.    Micro Data:  COVID-19 negative  Antimicrobials:    Interim history/subjective:  HPI above  Objective   Blood pressure 111/85, pulse (!) 123, temperature 98 F (36.7 C), temperature source Oral, resp. rate (!) 21, height 5\' 9"  (1.753 m), weight 99.8 kg, SpO2 97 %.       No intake or output data in the 24 hours ending 07/05/20 2111 Filed Weights   07/05/20 1129  Weight: 99.8 kg     Examination: General: Middle-aged male, resting in bed, no acute distress HENT: NCAT, tracking appropriately Lungs: Clear to auscultation bilaterally no crackles no wheeze Cardiovascular: Regular rate rhythm, S1-S2 Abdomen: Soft, nontender nondistended Extremities: Scratches, no significant edema, Neuro: Alert oriented following commands GU: Deferred  Resolved Hospital Problem list     Assessment & Plan:   Acute bilateral pulmonary embolism High risk submassive PE with high risk features to include hypotension on presentation and syncope. Troponin elevation Plan: Discussed risk benefits and alternatives of proceeding with treatments to include heparin versus systemic TPA. We discussed the risk of bleeding associated with TPA administration. However due to patient's high risk presentation features as well as CT imaging decision made for TPA administration. Discussed plan with ED pharmacist. Consent was obtained from patient and patient's wife present at bedside. Restart heparin following TPA administration and follow PTT per pharmacy protocol. Admit to the intensive care unit for acute 2-hour neurochecks following TPA administration. Echo ordered, pending  Acute hypoxemic respiratory failure secondary to above Plan: Wean FiO2 to maintain SPO2 greater than 90%.  Leukocytosis Likely reactive  AKI Hyperkalemia Plan: Follow urine output repeat BMP Lokelma x1   Best practice:  Diet: Clears Pain/Anxiety/Delirium protocol (if indicated): Not applicable VAP protocol (if indicated): Not applicable DVT prophylaxis: Heparin GI prophylaxis: na Glucose control: CBGs Mobility: Bedrest Code Status: Full code Family Communication: Discussed with patient's wife at bedside Disposition: ICU  Labs   CBC: Recent  Labs  Lab 07/05/20 1145  WBC 11.9*  NEUTROABS 8.6*  HGB 15.6  HCT 47.6  MCV 82.2  PLT 673    Basic Metabolic Panel: Recent Labs  Lab 07/05/20 1145  NA  137  K 5.7*  CL 103  CO2 19*  GLUCOSE 409*  BUN 19  CREATININE 1.29*  CALCIUM 9.1   GFR: Estimated Creatinine Clearance: 81.5 mL/min (A) (by C-G formula based on SCr of 1.29 mg/dL (H)). Recent Labs  Lab 07/05/20 1145  WBC 11.9*    Liver Function Tests: Recent Labs  Lab 07/05/20 1145  AST 80*  ALT 48*  ALKPHOS 62  BILITOT 2.1*  PROT 6.5  ALBUMIN 3.9   No results for input(s): LIPASE, AMYLASE in the last 168 hours. No results for input(s): AMMONIA in the last 168 hours.  ABG No results found for: PHART, PCO2ART, PO2ART, HCO3, TCO2, ACIDBASEDEF, O2SAT   Coagulation Profile: No results for input(s): INR, PROTIME in the last 168 hours.  Cardiac Enzymes: No results for input(s): CKTOTAL, CKMB, CKMBINDEX, TROPONINI in the last 168 hours.  HbA1C: Hgb A1c MFr Bld  Date/Time Value Ref Range Status  04/25/2019 02:55 AM 10.1 (H) 4.8 - 5.6 % Final    Comment:    (NOTE) Pre diabetes:          5.7%-6.4% Diabetes:              >6.4% Glycemic control for   <7.0% adults with diabetes     CBG: No results for input(s): GLUCAP in the last 168 hours.  Review of Systems:   Review of Systems  Constitutional: Negative for chills, fever, malaise/fatigue and weight loss.  HENT: Negative for hearing loss, sore throat and tinnitus.   Eyes: Negative for blurred vision and double vision.  Respiratory: Negative for cough, hemoptysis, sputum production, shortness of breath, wheezing and stridor.   Cardiovascular: Positive for chest pain and palpitations. Negative for orthopnea, leg swelling and PND.  Gastrointestinal: Negative for abdominal pain, constipation, diarrhea, heartburn, nausea and vomiting.  Genitourinary: Negative for dysuria, hematuria and urgency.  Musculoskeletal: Negative for joint pain and myalgias.  Skin: Negative for itching and rash.  Neurological: Negative for dizziness, tingling, weakness and headaches.  Endo/Heme/Allergies: Negative for environmental  allergies. Does not bruise/bleed easily.  Psychiatric/Behavioral: Negative for depression. The patient is not nervous/anxious and does not have insomnia.   All other systems reviewed and are negative.    Past Medical History  He,  has a past medical history of Anxiety, Asthma, Diabetes mellitus without complication ([place]), HLD (hyperlipidemia), and OSA (obstructive sleep apnea).   Surgical History   History reviewed. No pertinent surgical history.   Social History   reports that he has never smoked. He has never used smokeless tobacco. He reports previous alcohol use. He reports that he does not use drugs.   Family History   His family history includes Diabetes Mellitus II in his mother; Thrombophlebitis in his father.   Allergies Allergies  Allergen Reactions  . Contrast Media [Iodinated Diagnostic Agents] Hives    As a 49 year old experienced hives and nausea     Home Medications  Prior to Admission medications   Medication Sig Start Date End Date Taking? Authorizing Provider  acetaminophen (TYLENOL) 500 MG tablet Take 1,000 mg by mouth every 6 (six) hours as needed for mild pain.   Yes [provider]  albuterol (VENTOLIN HFA) 108 (90 Base) MCG/ACT inhaler Inhale 2 puffs into the lungs every 4 (four)  hours as needed for wheezing. 12/29/18  Yes [provider]  ALPRAZolam Duanne Moron) 0.25 MG tablet Take 0.25 mg by mouth daily as needed for anxiety or sleep.   Yes [provider]  fenofibrate 160 MG tablet Take 160 mg by mouth daily. 02/23/19  Yes [provider]  losartan (COZAAR) 50 MG tablet Take 50 mg by mouth daily. 02/23/19  Yes [provider]  meloxicam (MOBIC) 15 MG tablet Take 15 mg by mouth at bedtime.  06/02/20  Yes [provider]  tetrahydrozoline-zinc (VISINE-AC) 0.05-0.25 % ophthalmic solution Place 2 drops into both eyes as needed (allergy).   Yes [provider]  XIGDUO XR 01-999 MG TB24 Take 2 tablets by mouth  daily. 06/13/20  Yes [provider]  apixaban (ELIQUIS) 5 MG TABS tablet Take 1 tablet (5 mg total) by mouth 2 (two) times daily. Take 2 tablets (10 mg ) twice a day  for 7 days followed by 1 tablet(5mg ) twice a day. Patient not taking: Reported on 07/05/2020 04/26/19   Dhungel, Flonnie Overman, MD     This patient is critically ill with multiple organ system failure; which, requires frequent high complexity decision making, assessment, support, evaluation, and titration of therapies. This was completed through the application of advanced monitoring technologies and extensive interpretation of multiple databases. During this encounter critical care time was devoted to patient care services described in this note for 36 minutes.  [facility] 07/05/2020 9:11 PM

## 2020-07-06 ENCOUNTER — Inpatient Hospital Stay (HOSPITAL_COMMUNITY): Payer: BC Managed Care – PPO

## 2020-07-06 DIAGNOSIS — I2699 Other pulmonary embolism without acute cor pulmonale: Secondary | ICD-10-CM

## 2020-07-06 DIAGNOSIS — I2602 Saddle embolus of pulmonary artery with acute cor pulmonale: Secondary | ICD-10-CM

## 2020-07-06 DIAGNOSIS — N179 Acute kidney failure, unspecified: Secondary | ICD-10-CM | POA: Diagnosis not present

## 2020-07-06 DIAGNOSIS — R739 Hyperglycemia, unspecified: Secondary | ICD-10-CM | POA: Diagnosis not present

## 2020-07-06 DIAGNOSIS — I2609 Other pulmonary embolism with acute cor pulmonale: Secondary | ICD-10-CM | POA: Diagnosis not present

## 2020-07-06 LAB — CBC
HCT: 40 % (ref 39.0–52.0)
Hemoglobin: 13.4 g/dL (ref 13.0–17.0)
MCH: 27.6 pg (ref 26.0–34.0)
MCHC: 33.5 g/dL (ref 30.0–36.0)
MCV: 82.5 fL (ref 80.0–100.0)
Platelets: 166 10*3/uL (ref 150–400)
RBC: 4.85 MIL/uL (ref 4.22–5.81)
RDW: 13.7 % (ref 11.5–15.5)
WBC: 7.9 10*3/uL (ref 4.0–10.5)
nRBC: 0 % (ref 0.0–0.2)

## 2020-07-06 LAB — HEPARIN LEVEL (UNFRACTIONATED)
Heparin Unfractionated: <0.1 IU/mL — ABNORMAL LOW (ref 0.30–0.70)
Heparin Unfractionated: 0.35 IU/mL (ref 0.30–0.70)
Heparin Unfractionated: 0.35 IU/mL (ref 0.30–0.70)

## 2020-07-06 LAB — HIV ANTIBODY (ROUTINE TESTING W REFLEX): HIV Screen 4th Generation wRfx: NONREACTIVE

## 2020-07-06 LAB — BASIC METABOLIC PANEL
Anion gap: 10 (ref 5–15)
BUN: 19 mg/dL (ref 6–20)
CO2: 22 mmol/L (ref 22–32)
Calcium: 8.4 mg/dL — ABNORMAL LOW (ref 8.9–10.3)
Chloride: 105 mmol/L (ref 98–111)
Creatinine, Ser: 1.13 mg/dL (ref 0.61–1.24)
GFR, Estimated: >60 mL/min (ref >60)
Glucose, Bld: 267 mg/dL — ABNORMAL HIGH (ref 70–99)
Potassium: 3.7 mmol/L (ref 3.5–5.1)
Sodium: 137 mmol/L (ref 135–145)

## 2020-07-06 LAB — APTT: aPTT: 36 seconds (ref 24–36)

## 2020-07-06 LAB — BRAIN NATRIURETIC PEPTIDE: B Natriuretic Peptide: 89.1 pg/mL (ref 0.0–100.0)

## 2020-07-06 LAB — GLUCOSE, CAPILLARY
Glucose-Capillary: 142 mg/dL — ABNORMAL HIGH (ref 70–99)
Glucose-Capillary: 202 mg/dL — ABNORMAL HIGH (ref 70–99)
Glucose-Capillary: 271 mg/dL — ABNORMAL HIGH (ref 70–99)
Glucose-Capillary: 275 mg/dL — ABNORMAL HIGH (ref 70–99)
Glucose-Capillary: 228 mg/dL — ABNORMAL HIGH (ref 70–99)

## 2020-07-06 LAB — MRSA PCR SCREENING: MRSA by PCR: NEGATIVE

## 2020-07-06 LAB — HEMOGLOBIN A1C
Hgb A1c MFr Bld: 9.5 % — ABNORMAL HIGH (ref 4.8–5.6)
Mean Plasma Glucose: 225.95 mg/dL

## 2020-07-06 MED ORDER — ACETAMINOPHEN 325 MG PO TABS
650.0000 mg | ORAL_TABLET | ORAL | Status: DC | PRN
  Administered 2020-07-06 – 2020-07-07 (×3): 650 mg via ORAL
  Filled 2020-07-06 (×3): qty 2

## 2020-07-06 MED ORDER — CHLORHEXIDINE GLUCONATE CLOTH 2 % EX PADS
6.0000 | MEDICATED_PAD | Freq: Every day | CUTANEOUS | Status: DC
  Administered 2020-07-08: 6 via TOPICAL

## 2020-07-06 MED ORDER — INSULIN GLARGINE 100 UNIT/ML ~~LOC~~ SOLN
10.0000 [IU] | Freq: Every day | SUBCUTANEOUS | Status: DC
  Administered 2020-07-06 – 2020-07-07 (×2): 10 [IU] via SUBCUTANEOUS
  Filled 2020-07-06 (×3): qty 0.1

## 2020-07-06 NOTE — Progress Notes (Signed)
[place] for Heparin  Indication: pulmonary embolus, s/p alteplase  Allergies  Allergen Reactions  . Contrast Media [Iodinated Diagnostic Agents] Hives    As a 49 year old experienced hives and nausea    Patient Measurements: Height: 5\' 9"  (175.3 cm) Weight: 99.7 kg (219 lb 12.8 oz) IBW/kg (Calculated) : 70.7  Heparin dosing weight: 91.8 kg  Vital Signs: Temp: 98.2 F (36.8 C) (10/08 1542) Temp Source: Oral (10/08 1542) BP: 116/80 (10/08 1700) Pulse Rate: 97 (10/08 1700)  Labs: Recent Labs    07/05/20 1145 07/05/20 1351 07/06/20 0030 07/06/20 0801 07/06/20 1548  HGB 15.6  --   --  13.4  --   HCT 47.6  --   --  40.0  --   PLT 191  --   --  166  --   APTT  --   --  36  --   --   HEPARINUNFRC  --   --   --  <0.10* 0.35  CREATININE 1.29*  --   --   --  1.13  TROPONINIHS 24* 68*  --   --   --     Estimated Creatinine Clearance: 93.1 mL/min (by C-G formula based on SCr of 1.13 mg/dL).   Medical History: Past Medical History:  Diagnosis Date  . Anxiety   . Asthma   . Diabetes mellitus without complication ([place])   . HLD (hyperlipidemia)   . OSA (obstructive sleep apnea)     Assessment: 49 y/o M with sub-massive PE s/p 2 hour alteplase administration. Heparin restarted overnight 10/7 when APTT check after alteplase was 36.  Heparin level now therapeutic at 0.35 after increasing IV Heparin to 2000 units/hr. CBC is within normal limits. No bleeding or issues with line noted per RN. Continuing with lower goal of 0.3-0.5 during this first 24 hours.   Goal of Therapy:  Heparin level 0.3-0.5  units/ml, increase to normal range after 24 hours Monitor platelets by anticoagulation protocol: Yes   Plan:  Continue heparin drip to 2000 units/hr Confirm in 6 hours.  Daily CBC/heparin level Monitor for bleeding  Sloan Leiter, PharmD, BCPS, BCCCP Clinical Pharmacist Please refer to [facility] for [place] numbers 07/06/2020 5:54  PM  Please check [email or web address] for unit-specific pharmacy phone numbers.

## 2020-07-06 NOTE — Progress Notes (Signed)
[place] for Heparin  Indication: pulmonary embolus, s/p alteplase  Allergies  Allergen Reactions  . Contrast Media [Iodinated Diagnostic Agents] Hives    As a 49 year old experienced hives and nausea    Patient Measurements: Height: 5\' 9"  (175.3 cm) Weight: 99.7 kg (219 lb 12.8 oz) IBW/kg (Calculated) : 70.7  Heparin dosing weight: 91.8 kg  Vital Signs: Temp: 98.4 F (36.9 C) (10/08 0650) Temp Source: Oral (10/08 0650) BP: 137/105 (10/08 0800) Pulse Rate: 102 (10/08 0800)  Labs: Recent Labs    07/05/20 1145 07/05/20 1351 07/06/20 0030 07/06/20 0801  HGB 15.6  --   --  13.4  HCT 47.6  --   --  40.0  PLT 191  --   --  166  APTT  --   --  36  --   HEPARINUNFRC  --   --   --  <0.10*  CREATININE 1.29*  --   --   --   TROPONINIHS 24* 68*  --   --     Estimated Creatinine Clearance: 81.5 mL/min (A) (by C-G formula based on SCr of 1.29 mg/dL (H)).   Medical History: Past Medical History:  Diagnosis Date  . Anxiety   . Asthma   . Diabetes mellitus without complication ([place])   . HLD (hyperlipidemia)   . OSA (obstructive sleep apnea)     Assessment: 49 y/o M with sub-massive PE s/p 2 hour alteplase administration. Heparin restarted overnight 10/7 when APTT check after alteplase was 36.  First level following heparin restart was subtherapeutic at <0.1. CBC wnl with Hgb 13.4, Hct 40, Plt 166. No bleeding or issues with line noted per RN. Will increase rate and recheck 6 hour level. Continuing with lower goal of 0.3-0.5 during this first 24 hours.   Goal of Therapy:  Heparin level 0.3-0.5  units/ml, increase to normal range after 24 hours Monitor platelets by anticoagulation protocol: Yes   Plan:  Increase heparin drip to 2000 units/hr 6-Hr heparin level  Daily CBC/heparin level Monitor for bleeding  Claudina Lick, PharmD PGY1 [place] Resident 07/06/2020 9:04 AM  Please check [email or web address] for unit-specific  pharmacy phone numbers.

## 2020-07-06 NOTE — Progress Notes (Signed)
[facility] [place] Progress Note Patient Name: Steven [name] DOB: [birth year] MRN: [id number]   Date of Service  07/06/2020  HPI/Events of Note  Patient c/o headache which is usual for him at home. Patient has slightly elevated liver enzymes.  eICU Interventions  PRN Tylenol 650 mg PO Q 4 hours added.        Tyanna Hach U Latrisha Coiro 07/06/2020, 4:00 AM

## 2020-07-06 NOTE — Progress Notes (Signed)
Notified Mickel Baas Gleason, PA that patients [place] of bilateral extremities was positive for multiple DVTs in left lower extremity.

## 2020-07-06 NOTE — Progress Notes (Signed)
Lower extremity venous has been completed.   Preliminary results in CV Proc.   Results given to RN.   Steven [name] 07/06/2020 3:00 PM

## 2020-07-06 NOTE — Progress Notes (Signed)
NAME:  Steven [name], MRN:  [id number], DOB:  [birth year], LOS: 1 ADMISSION DATE:  07/05/2020, CONSULTATION DATE: 07/05/2020 REFERRING MD: Dr. Ralene Bathe, CHIEF COMPLAINT: Pulmonary embolism  Brief History   49 year old gentleman, hypotensive, syncopal episode found to have bilateral pulmonary embolism on CTA.  History of present illness   49 year old gentleman, with medical history of pulmonary embolism greater than a year ago, was on Eliquis, currently off anticoagulation.  Past medical history to include hyperlipidemia, OSA, hypertension, diabetes.  Patient had progressive symptoms of shortness of breath and dyspnea on exertion.  He was unable to help around the house.  Decision was made to seek evaluation in the ER.  Upon presentation to the ED patient was hypotensive.  He had a syncopal episode.  Systolic blood pressure was in the 60s.  At this time he has stabilized with heart rate in the 120s blood pressure systolic 409-8 10.  He is able to talk in complete sentences.  On 3 L nasal cannula O2 to maintain sats in the mid 90s.  Wife is at bedside.  Past Medical History   Past Medical History:  Diagnosis Date  . Anxiety   . Asthma   . Diabetes mellitus without complication ([place])   . HLD (hyperlipidemia)   . OSA (obstructive sleep apnea)      Significant Hospital Events    Consults:   Procedures:   Significant Diagnostic Tests:  CTA chest bilateral pulmonary embolism, evidence of right ventricular strain with an RV/LV ratio of 2.1. The patient's images have been independently reviewed by me.    Micro Data:  COVID-19 negative  Antimicrobials:    Interim history/subjective:  Denies complaints. Feels better than yesterday- no CP, SOB, or dizziness. Has been OOB.  Objective   Blood pressure 116/80, pulse 97, temperature 98.2 F (36.8 C), temperature source Oral, resp. rate 19, height 5\' 9"  (1.753 m), weight 99.7 kg, SpO2 97 %.        Intake/Output Summary (Last 24 hours) at  07/06/2020 1731 Last data filed at 07/06/2020 1700 Gross per 24 hour  Intake 3289.82 ml  Output 750 ml  Net 2539.82 ml   Filed Weights   07/05/20 1129 07/05/20 2218 07/06/20 0500  Weight: 99.8 kg 99.7 kg 99.7 kg    Examination: General: Middle-age man sitting up in bed talking to his wife in no acute distress HENT: [place]/AT, eyes anicteric Lungs: CTA B, speaking full sentences, no accessory muscle use or tachypnea. Cardiovascular: S1-S2, no murmurs, regular rate and rhythm Abdomen: Soft, nontender, nondistended Extremities: No clubbing, cyanosis, or significant edema Neuro: Awake and alert, answering questions appropriately.  Normal speech.  Tracking to both sides.  No focal deficits.   Resolved Hospital Problem list     Assessment & Plan:   Acute bilateral pulmonary embolism High risk submassive PE with high risk features to include hypotension on presentation and syncope.  Hypotension responsive to fluids. Troponin elevation -Received TPA in the ED.  Requires 24 hours of monitoring in the intensive care unit following TPA administration. -Pharmacy consult for heparin dosing after TPA -Discussed the need for age appropriate cancer screening.  Patient needs colonoscopy when it would be safe for anticoagulation to be interrupted briefly, and about 6 months. -Needs indefinite anticoagulation given second unprovoked VTE.  Discussed with patient and his wife.  He previously was taking Eliquis without bleeding complications or difficulty affording medication as long as he had a prescription coverage card. -Echocardiogram reviewed-no evidence of RV strain -Lower extremity ultrasound  with persistent DVT LLE  Acute hypoxemic respiratory failure secondary to above -Continue to monitor saturations.  Oxygen requirement resolved.  Leukocytosis-resolved -No additional follow-up required  AKI Hyperkalemia-resolved -Continue to monitor urine output -Strict I/os -Renally dose meds and avoid  nephrotoxic meds  Hyperglycemia with uncontrolled diabetes mellitus.  A1c 9.5. -Needs basal bolus insulin at discharge. Will start lantus 10 units daily plus SSI -Accu-Cheks Q4h with SSI PRN  Stable to step out of ICU after 24 hrs from TPA completion time. [facility] morning.  Best practice:  Diet: Clears Pain/Anxiety/Delirium protocol (if indicated): Not applicable VAP protocol (if indicated): Not applicable DVT prophylaxis: Heparin GI prophylaxis: na Glucose control: CBGs Mobility: Bedrest Code Status: Full code Family Communication: Discussed with patient's wife at bedside Disposition: ICU  Labs   CBC: Recent Labs  Lab 07/05/20 1145 07/06/20 0801  WBC 11.9* 7.9  NEUTROABS 8.6*  --   HGB 15.6 13.4  HCT 47.6 40.0  MCV 82.2 82.5  PLT 191 206    Basic Metabolic Panel: Recent Labs  Lab 07/05/20 1145 07/06/20 1548  NA 137 137  K 5.7* 3.7  CL 103 105  CO2 19* 22  GLUCOSE 409* 267*  BUN 19 19  CREATININE 1.29* 1.13  CALCIUM 9.1 8.4*   GFR: Estimated Creatinine Clearance: 93.1 mL/min (by C-G formula based on SCr of 1.13 mg/dL). Recent Labs  Lab 07/05/20 1145 07/06/20 0801  WBC 11.9* 7.9    Liver Function Tests: Recent Labs  Lab 07/05/20 1145  AST 80*  ALT 48*  ALKPHOS 62  BILITOT 2.1*  PROT 6.5  ALBUMIN 3.9   No results for input(s): LIPASE, AMYLASE in the last 168 hours. No results for input(s): AMMONIA in the last 168 hours.  ABG No results found for: PHART, PCO2ART, PO2ART, HCO3, TCO2, ACIDBASEDEF, O2SAT   Coagulation Profile: No results for input(s): INR, PROTIME in the last 168 hours.  Cardiac Enzymes: No results for input(s): CKTOTAL, CKMB, CKMBINDEX, TROPONINI in the last 168 hours.  HbA1C: Hgb A1c MFr Bld  Date/Time Value Ref Range Status  07/06/2020 12:30 AM 9.5 (H) 4.8 - 5.6 % Final    Comment:    (NOTE) Pre diabetes:          5.7%-6.4%  Diabetes:              >6.4%  Glycemic control for   <7.0% adults  with diabetes   04/25/2019 02:55 AM 10.1 (H) 4.8 - 5.6 % Final    Comment:    (NOTE) Pre diabetes:          5.7%-6.4% Diabetes:              >6.4% Glycemic control for   <7.0% adults with diabetes     CBG: Recent Labs  Lab 07/05/20 2334 07/06/20 0649 07/06/20 1152 07/06/20 1543 07/06/20 1545  GLUCAP 229* 142* 202* 271* [place] Reise Gladney, DO 07/06/20 5:31 PM [facility]

## 2020-07-06 NOTE — Progress Notes (Signed)
[place] for Heparin  Indication: pulmonary embolus, s/p alteplase  Allergies  Allergen Reactions  . Contrast Media [Iodinated Diagnostic Agents] Hives    As a 49 year old experienced hives and nausea    Patient Measurements: Height: 5\' 9"  (175.3 cm) Weight: 99.7 kg (219 lb 12.8 oz) IBW/kg (Calculated) : 70.7  Vital Signs: Temp: 98.3 F (36.8 C) (10/07 2218) Temp Source: Oral (10/07 2218) BP: 123/84 (10/08 0000) Pulse Rate: 114 (10/08 0000)  Labs: Recent Labs    07/05/20 1145 07/05/20 1351 07/06/20 0030  HGB 15.6  --   --   HCT 47.6  --   --   PLT 191  --   --   APTT  --   --  36  CREATININE 1.29*  --   --   TROPONINIHS 24* 68*  --     Estimated Creatinine Clearance: 81.5 mL/min (A) (by C-G formula based on SCr of 1.29 mg/dL (H)).   Medical History: Past Medical History:  Diagnosis Date  . Anxiety   . Asthma   . Diabetes mellitus without complication ([place])   . HLD (hyperlipidemia)   . OSA (obstructive sleep apnea)     Assessment: 49 y/o M with sub-massive PE s/p 2 hour alteplase administration. APTT check after alteplase is 36>>>will start heparin now.   Goal of Therapy:  Heparin level 0.3-0.5  units/ml, increase to normal range after 24 hours Monitor platelets by anticoagulation protocol: Yes   Plan:  Start heparin drip at 1800 units/hr 0800 heparin level  Daily CBC/heparin level Monitor for bleeding  Narda Bonds, PharmD, BCPS Clinical Pharmacist Phone: [phone]

## 2020-07-06 NOTE — Plan of Care (Signed)

## 2020-07-06 NOTE — Progress Notes (Signed)
  Echocardiogram 2D Echocardiogram has been performed.  Steven [name] 07/06/2020, 8:38 AM

## 2020-07-07 LAB — GLUCOSE, CAPILLARY
Glucose-Capillary: 160 mg/dL — ABNORMAL HIGH (ref 70–99)
Glucose-Capillary: 206 mg/dL — ABNORMAL HIGH (ref 70–99)
Glucose-Capillary: 162 mg/dL — ABNORMAL HIGH (ref 70–99)
Glucose-Capillary: 172 mg/dL — ABNORMAL HIGH (ref 70–99)

## 2020-07-07 LAB — CBC
HCT: 38.7 % — ABNORMAL LOW (ref 39.0–52.0)
Hemoglobin: 12.6 g/dL — ABNORMAL LOW (ref 13.0–17.0)
MCH: 26.9 pg (ref 26.0–34.0)
MCHC: 32.6 g/dL (ref 30.0–36.0)
MCV: 82.5 fL (ref 80.0–100.0)
Platelets: 157 10*3/uL (ref 150–400)
RBC: 4.69 MIL/uL (ref 4.22–5.81)
RDW: 13.3 % (ref 11.5–15.5)
WBC: 7.2 10*3/uL (ref 4.0–10.5)
nRBC: 0 % (ref 0.0–0.2)

## 2020-07-07 LAB — HEPARIN LEVEL (UNFRACTIONATED): Heparin Unfractionated: 0.33 IU/mL (ref 0.30–0.70)

## 2020-07-07 MED ORDER — APIXABAN (ELIQUIS) EDUCATION KIT FOR DVT/PE PATIENTS
PACK | Freq: Once | Status: AC
  Filled 2020-07-07 (×2): qty 1

## 2020-07-07 MED ORDER — APIXABAN 5 MG PO TABS
10.0000 mg | ORAL_TABLET | Freq: Two times a day (BID) | ORAL | Status: DC
  Administered 2020-07-08: 10 mg via ORAL
  Filled 2020-07-07: qty 2

## 2020-07-07 MED ORDER — APIXABAN 5 MG PO TABS: 5.0000 mg | ORAL_TABLET | Freq: Two times a day (BID) | ORAL | Status: DC

## 2020-07-07 MED ORDER — INSULIN GLARGINE 100 UNIT/ML ~~LOC~~ SOLN
5.0000 [IU] | Freq: Once | SUBCUTANEOUS | Status: AC
  Administered 2020-07-07: 5 [IU] via SUBCUTANEOUS
  Filled 2020-07-07: qty 0.05

## 2020-07-07 MED ORDER — INSULIN GLARGINE 100 UNIT/ML ~~LOC~~ SOLN
15.0000 [IU] | Freq: Every day | SUBCUTANEOUS | Status: DC
  Administered 2020-07-08: 15 [IU] via SUBCUTANEOUS
  Filled 2020-07-07: qty 0.15

## 2020-07-07 NOTE — Progress Notes (Signed)
[place] for Heparin  Indication: pulmonary embolus, s/p alteplase  Allergies  Allergen Reactions  . Contrast Media [Iodinated Diagnostic Agents] Hives    As a 49 year old experienced hives and nausea    Patient Measurements: Height: 5\' 9"  (175.3 cm) Weight: 99.7 kg (219 lb 12.8 oz) IBW/kg (Calculated) : 70.7  Heparin dosing weight: 91.8 kg  Vital Signs: Temp: 97.8 F (36.6 C) (10/09 0823) Temp Source: Oral (10/09 0420) BP: 124/79 (10/09 0700) Pulse Rate: 89 (10/09 0700)  Labs: Recent Labs    07/05/20 1145 07/05/20 1145 07/05/20 1351 07/06/20 0030 07/06/20 0801 07/06/20 0801 07/06/20 1548 07/06/20 2155 07/07/20 0110  HGB 15.6   < >  --   --  13.4  --   --   --  12.6*  HCT 47.6  --   --   --  40.0  --   --   --  38.7*  PLT 191  --   --   --  166  --   --   --  157  APTT  --   --   --  36  --   --   --   --   --   HEPARINUNFRC  --   --   --   --  <0.10*   < > 0.35 0.35 0.33  CREATININE 1.29*  --   --   --   --   --  1.13  --   --   TROPONINIHS 24*  --  68*  --   --   --   --   --   --    < > = values in this interval not displayed.    Estimated Creatinine Clearance: 93.1 mL/min (by C-G formula based on SCr of 1.13 mg/dL).  Assessment: 49 y/o Male with sub-massive PE s/p tpa for heparin.  Heparin level within goal range this AM.  No overt bleeding or complications noted.  Goal of Therapy:  Heparin level 0.3 - 0.7 units/mL Monitor platelets by anticoagulation protocol: Yes   Plan:  Continue Heparin at current rate F/u plans for oral Eliquis soon.  Will need 10 mg BID x 7 days, then 5 mg BID.  [place] Crane, Roylene Reason, BCCP Clinical Pharmacist  07/07/2020 8:27 AM   [facility] pharmacy phone numbers are listed on [email or web address]

## 2020-07-07 NOTE — Plan of Care (Signed)

## 2020-07-07 NOTE — Progress Notes (Signed)
[place] for Heparin  Indication: pulmonary embolus, s/p alteplase  Allergies  Allergen Reactions  . Contrast Media [Iodinated Diagnostic Agents] Hives    As a 49 year old experienced hives and nausea    Patient Measurements: Height: 5\' 9"  (175.3 cm) Weight: 99.7 kg (219 lb 12.8 oz) IBW/kg (Calculated) : 70.7  Heparin dosing weight: 91.8 kg  Vital Signs: Temp: 98.2 F (36.8 C) (10/08 2329) Temp Source: Oral (10/08 2329) BP: 124/79 (10/08 2300) Pulse Rate: 90 (10/08 2300)  Labs: Recent Labs    07/05/20 1145 07/05/20 1351 07/06/20 0030 07/06/20 0801 07/06/20 1548 07/06/20 2155  HGB 15.6  --   --  13.4  --   --   HCT 47.6  --   --  40.0  --   --   PLT 191  --   --  166  --   --   APTT  --   --  36  --   --   --   HEPARINUNFRC  --   --   --  <0.10* 0.35 0.35  CREATININE 1.29*  --   --   --  1.13  --   TROPONINIHS 24* 68*  --   --   --   --     Estimated Creatinine Clearance: 93.1 mL/min (by C-G formula based on SCr of 1.13 mg/dL).  Assessment: 49 y/o Male with sub-massive PE s/p tpa for heparin  Goal of Therapy:  Heparin level 0.3 - 0.7 units/mL Monitor platelets by anticoagulation protocol: Yes   Plan:  Continue Heparin at current rate  Phillis Knack, PharmD, BCPS

## 2020-07-07 NOTE — Plan of Care (Signed)
  Problem: Clinical Measurements: Goal: Will remain free from infection Outcome: Completed/Met Goal: Respiratory complications will improve Outcome: Completed/Met   Problem: Nutrition: Goal: Adequate nutrition will be maintained Outcome: Completed/Met   Problem: Coping: Goal: Level of anxiety will decrease Outcome: Completed/Met   Problem: Elimination: Goal: Will not experience complications related to bowel motility Outcome: Completed/Met Goal: Will not experience complications related to urinary retention Outcome: Completed/Met   Problem: Pain Managment: Goal: General experience of comfort will improve Outcome: Completed/Met   Problem: Safety: Goal: Ability to remain free from injury will improve Outcome: Completed/Met   Problem: Skin Integrity: Goal: Risk for impaired skin integrity will decrease Outcome: Completed/Met   

## 2020-07-07 NOTE — Progress Notes (Signed)
PROGRESS NOTE    Steven [name]  BPZ:[id number] DOB: 1971/01/13 DOA: 07/05/2020 PCP: Shirline Frees, MD   Brief Narrative:  49 year old gentleman, with medical history of pulmonary embolism greater than a year ago, was on Eliquis, currently off anticoagulation.  Past medical history to include hyperlipidemia, OSA, hypertension, diabetes.  Patient had progressive symptoms of shortness of breath and dyspnea on exertion.  He was unable to help around the house.  Decision was made to seek evaluation in the ER.  Upon presentation to the ED patient was hypotensive.  He had a syncopal episode.  Systolic blood pressure was in the 60s.  At this time he has stabilized with heart rate in the 120s blood pressure systolic 242-3 10.  He is able to talk in complete sentences.  On 3 L nasal cannula O2 to maintain sats in the mid 90s.  Wife is at bedside.   Assessment & Plan:   Active Problems:   Pulmonary embolism (HCC)   Acute bilateral pulmonary embolism, appears to be unprovoked, and recurrent -Patient is in remains at high risk given submassive PE with hypotension and syncope on presentation.   -Clinically improving drastically over the past 24 hours, consideration to transfer from IV heparin to p.o. anticoagulation the next 24 to 48 hours pending clinical course.   -Encourage patient to increase activity today, follow blood pressure orthostatics and hypoxia closely  -Status post TPA on 07/05/2020 -monitor closely in the ICU now reasonable for transition to regular telemetry floor  -Lengthy discussion about need for further evaluation and screening given this is his second unprovoked DVT/PE in the last 2 years. -Recommend heme-onc consult and follow-up in the outpatient setting if PCP is agreeable, he apparently has had previously thorough work-up for clotting disorders which was negative, labs unavailable at this time -Needs indefinite anticoagulation given second unprovoked VTE.  Discussed with  patient and his wife.  He previously was taking Eliquis without bleeding complications or difficulty affording medication as long as he had a prescription coverage card. -Echocardiogram reviewed-no evidence of RV strain -Lower extremity ultrasound with persistent large DVT LLE  Acute hypoxemic respiratory failure secondary to above, resolved -Continue to monitor saturations.  Leukemoid reaction, resolved -In the setting of above, no additional follow-up required  AKI/Hyperkalemia-resolved -Continue to monitor urine output -Strict I/os -Renally dose meds and avoid nephrotoxic meds  Hyperglycemia with uncontrolled diabetes mellitus.  A1c 9.5. -He will need insulin at discharge given A1c -Increase Lantus to 15 units once daily, continue sliding scale insulin and hypoglycemic protocol.   DVT prophylaxis: Heparin drip Code Status: Full Family Communication: None present  Status is: Inpatient  Dispo: The patient is from: Home              Anticipated d/c is to: Home              Anticipated d/c date is: 24 to 48 hours pending clinical course              Patient currently not medically stable for discharge given ongoing need for IV heparin drip and transition to p.o. anticoagulation  Consultants:   PCCM  Procedures:   TPA 07/05/2020  Antimicrobials:  None indicated  Subjective: No acute issues or events overnight, patient feels quite well, considers himself to be "back to baseline" we had a very lengthy discussion at bedside given patient's lack of symptoms we will continue to monitor on heparin drip for the next 24 hours likely discharge home tomorrow presuming there are  no complications.  He denies chest pain, shortness of breath, nausea, vomiting, diarrhea, constipation, headache, fevers, chills.  Objective: Vitals:   07/07/20 0420 07/07/20 0500 07/07/20 0600 07/07/20 0700  BP:  113/78 111/74 124/79  Pulse:  80 79 89  Resp:  16 16 15   Temp: 97.8 F (36.6 C)      TempSrc: Oral     SpO2:  98% 96% 99%  Weight:      Height:        Intake/Output Summary (Last 24 hours) at 07/07/2020 0744 Last data filed at 07/07/2020 0700 Gross per 24 hour  Intake 3114.69 ml  Output 300 ml  Net 2814.69 ml   Filed Weights   07/05/20 1129 07/05/20 2218 07/06/20 0500  Weight: 99.8 kg 99.7 kg 99.7 kg    Examination:  General exam: Appears calm and comfortable  Respiratory system: Clear to auscultation. Respiratory effort normal. Cardiovascular system: S1 & S2 heard, RRR. No JVD, murmurs, rubs, gallops or clicks. No pedal edema. Gastrointestinal system: Abdomen is nondistended, soft and nontender. No organomegaly or masses felt. Normal bowel sounds heard. Central nervous system: Alert and oriented. No focal neurological deficits. Extremities: Symmetric 5 x 5 power. Skin: No rashes, lesions or ulcers Psychiatry: Judgement and insight appear normal. Mood & affect appropriate.     Data Reviewed: I have personally reviewed following labs and imaging studies  CBC: Recent Labs  Lab 07/05/20 1145 07/06/20 0801 07/07/20 0110  WBC 11.9* 7.9 7.2  NEUTROABS 8.6*  --   --   HGB 15.6 13.4 12.6*  HCT 47.6 40.0 38.7*  MCV 82.2 82.5 82.5  PLT 191 166 852   Basic Metabolic Panel: Recent Labs  Lab 07/05/20 1145 07/06/20 1548  NA 137 137  K 5.7* 3.7  CL 103 105  CO2 19* 22  GLUCOSE 409* 267*  BUN 19 19  CREATININE 1.29* 1.13  CALCIUM 9.1 8.4*   GFR: Estimated Creatinine Clearance: 93.1 mL/min (by C-G formula based on SCr of 1.13 mg/dL). Liver Function Tests: Recent Labs  Lab 07/05/20 1145  AST 80*  ALT 48*  ALKPHOS 62  BILITOT 2.1*  PROT 6.5  ALBUMIN 3.9   No results for input(s): LIPASE, AMYLASE in the last 168 hours. No results for input(s): AMMONIA in the last 168 hours. Coagulation Profile: No results for input(s): INR, PROTIME in the last 168 hours. Cardiac Enzymes: No results for input(s): CKTOTAL, CKMB, CKMBINDEX, TROPONINI in the last  168 hours. BNP (last 3 results) No results for input(s): PROBNP in the last 8760 hours. HbA1C: Recent Labs    07/06/20 0030  HGBA1C 9.5*   CBG: Recent Labs  Lab 07/06/20 1152 07/06/20 1543 07/06/20 1545 07/06/20 2200 07/07/20 0620  GLUCAP 202* 271* 275* 228* 160*   Lipid Profile: No results for input(s): CHOL, HDL, LDLCALC, TRIG, CHOLHDL, LDLDIRECT in the last 72 hours. Thyroid Function Tests: No results for input(s): TSH, T4TOTAL, FREET4, T3FREE, THYROIDAB in the last 72 hours. Anemia Panel: No results for input(s): VITAMINB12, FOLATE, FERRITIN, TIBC, IRON, RETICCTPCT in the last 72 hours. Sepsis Labs: No results for input(s): PROCALCITON, LATICACIDVEN in the last 168 hours.  Recent Results (from the past 240 hour(s))  Resp Panel by RT PCR (RSV, Flu A&B, Covid) - Nasopharyngeal Swab     Status: None   Collection Time: 07/05/20  5:06 PM   Specimen: Nasopharyngeal Swab  Result Value Ref Range Status   SARS Coronavirus 2 by RT PCR NEGATIVE NEGATIVE Final    Comment: (NOTE)  SARS-CoV-2 target nucleic acids are NOT DETECTED.  The SARS-CoV-2 RNA is generally detectable in upper respiratoy specimens during the acute phase of infection. The lowest concentration of SARS-CoV-2 viral copies this assay can detect is 131 copies/mL. A negative result does not preclude SARS-Cov-2 infection and should not be used as the sole basis for treatment or other patient management decisions. A negative result may occur with  improper specimen collection/handling, submission of specimen other than nasopharyngeal swab, presence of viral mutation(s) within the areas targeted by this assay, and inadequate number of viral copies (<131 copies/mL). A negative result must be combined with clinical observations, patient history, and epidemiological information. The expected result is Negative.  Fact Sheet for Patients:  PinkCheek.be  Fact Sheet for Healthcare  Providers:  GravelBags.it  This test is no t yet approved or cleared by the [place] FDA and  has been authorized for detection and/or diagnosis of SARS-CoV-2 by FDA under an Emergency Use Authorization (EUA). This EUA will remain  in effect (meaning this test can be used) for the duration of the COVID-19 declaration under Section 564(b)(1) of the Act, 21 U.S.C. section 360bbb-3(b)(1), unless the authorization is terminated or revoked sooner.     Influenza A by PCR NEGATIVE NEGATIVE Final   Influenza B by PCR NEGATIVE NEGATIVE Final    Comment: (NOTE) The Xpert Xpress SARS-CoV-2/FLU/RSV assay is intended as an aid in  the diagnosis of influenza from Nasopharyngeal swab specimens and  should not be used as a sole basis for treatment. Nasal washings and  aspirates are unacceptable for Xpert Xpress SARS-CoV-2/FLU/RSV  testing.  Fact Sheet for Patients: PinkCheek.be  Fact Sheet for Healthcare Providers: GravelBags.it  This test is not yet approved or cleared by the [place] FDA and  has been authorized for detection and/or diagnosis of SARS-CoV-2 by  FDA under an Emergency Use Authorization (EUA). This EUA will remain  in effect (meaning this test can be used) for the duration of the  Covid-19 declaration under Section 564(b)(1) of the Act, 21  U.S.C. section 360bbb-3(b)(1), unless the authorization is  terminated or revoked.    Respiratory Syncytial Virus by PCR NEGATIVE NEGATIVE Final    Comment: (NOTE) Fact Sheet for Patients: PinkCheek.be  Fact Sheet for Healthcare Providers: GravelBags.it  This test is not yet approved or cleared by the [place] FDA and  has been authorized for detection and/or diagnosis of SARS-CoV-2 by  FDA under an Emergency Use Authorization (EUA). This EUA will remain  in effect (meaning this  test can be used) for the duration of the  COVID-19 declaration under Section 564(b)(1) of the Act, 21 U.S.C.  section 360bbb-3(b)(1), unless the authorization is terminated or  revoked. Performed at [facility], [place] [address] [place], [place] [address]   MRSA PCR Screening     Status: None   Collection Time: 07/05/20 11:42 PM   Specimen: Nasal Mucosa; Nasopharyngeal  Result Value Ref Range Status   MRSA by PCR NEGATIVE NEGATIVE Final    Comment:        The GeneXpert MRSA Assay (FDA approved for NASAL specimens only), is one component of a comprehensive MRSA colonization surveillance program. It is not intended to diagnose MRSA infection nor to guide or monitor treatment for MRSA infections. Performed at [facility], [place] [address] [place], [place] [address]       Radiology Studies: DG Chest 2 View  Result Date: 07/05/2020 CLINICAL DATA:  Chest pain EXAM: CHEST -  2 VIEW COMPARISON:  04/24/2019 FINDINGS: The heart size and mediastinal contours are within normal limits. Both lungs are clear. Probable enchondroma within the left humeral head. The visualized skeletal structures are otherwise unremarkable. IMPRESSION: No active cardiopulmonary disease. Electronically Signed   By: Davina Poke D.O.   On: 07/05/2020 12:21   CT Angio Chest PE W/Cm &/Or Wo Cm  Result Date: 07/05/2020 CLINICAL DATA:  49 year old male with concern for pulmonary embolism. EXAM: CT ANGIOGRAPHY CHEST WITH CONTRAST TECHNIQUE: Multidetector CT imaging of the chest was performed using the standard protocol during bolus administration of intravenous contrast. Multiplanar CT image reconstructions and MIPs were obtained to evaluate the vascular anatomy. CONTRAST:  21mL OMNIPAQUE IOHEXOL 350 MG/ML SOLN COMPARISON:  Chest CT dated 04/24/2019. FINDINGS: Cardiovascular: There is no cardiomegaly or pericardial effusion. There is dilatation of the right heart chambers. The right ventricular lumen  measures approximately 4.6 cm in diameter and the left ventricular lumen measures 2.2 cm with RV/LV ratio of 2.1. The thoracic aorta is unremarkable. Large bilateral pulmonary artery emboli involving the right main pulmonary artery extending into the lobar and segmental branches of the right upper and right lower lobes. Left upper and left lower lobe pulmonary artery emboli involving the lobar and segmental branches. Mediastinum/Nodes: There is no hilar or mediastinal adenopathy. The esophagus and the thyroid gland are grossly unremarkable. No mediastinal fluid collection. Lungs/Pleura: There is heterogeneous hazy density throughout the lungs, likely related to atelectasis or areas of air trapping. No consolidative changes. There is no pleural effusion pneumothorax. The central airways are patent. Upper Abdomen: Probable fatty liver. Musculoskeletal: Degenerative changes of the spine. No acute osseous pathology. Review of the MIP images confirms the above findings. IMPRESSION: Positive for acute PE with of right heart strain (RV/LV Ratio = 2.1) consistent with at least submassive (intermediate risk) PE. The presence of right heart strain has been associated with an increased risk of morbidity and mortality. These results were called by telephone at the time of interpretation on 07/05/2020 at 7:18 pm to provider Marvia Pickles, who verbally acknowledged these results. Electronically Signed   By: Anner Crete M.D.   On: 07/05/2020 19:20   ECHOCARDIOGRAM COMPLETE  Result Date: 07/06/2020  Lower Venous DVTStudy Indications: Pulmonary embolism.  Comparison Study: 04/25/19 LEV - Negative Performing Technologist: Abram Sander RVS  Examination Guidelines: A complete evaluation includes B-mode imaging, spectral Doppler, color Doppler, and power Doppler as needed of all accessible portions of each vessel. Bilateral testing is considered an integral part of a complete examination. Limited examinations for reoccurring  indications may be performed as noted. The reflux portion of the exam is performed with the patient in reverse Trendelenburg.  +---------+---------------+---------+-----------+----------+--------------+ RIGHT    CompressibilityPhasicitySpontaneityPropertiesThrombus Aging +---------+---------------+---------+-----------+----------+--------------+ CFV      Full           Yes      Yes                                 +---------+---------------+---------+-----------+----------+--------------+ SFJ      Full                                                        +---------+---------------+---------+-----------+----------+--------------+ FV Prox  Full                                                        +---------+---------------+---------+-----------+----------+--------------+  FV Mid   Full                                                        +---------+---------------+---------+-----------+----------+--------------+ FV DistalFull                                                        +---------+---------------+---------+-----------+----------+--------------+ PFV      Full                                                        +---------+---------------+---------+-----------+----------+--------------+ POP      Full           Yes      Yes                                 +---------+---------------+---------+-----------+----------+--------------+ PTV      Full                                                        +---------+---------------+---------+-----------+----------+--------------+ PERO     Full                                                        +---------+---------------+---------+-----------+----------+--------------+   +---------+---------------+---------+-----------+----------+-----------------+ LEFT     CompressibilityPhasicitySpontaneityPropertiesThrombus Aging     +---------+---------------+---------+-----------+----------+-----------------+ CFV      Partial        No       No                   Age Indeterminate +---------+---------------+---------+-----------+----------+-----------------+ SFJ      Full                                                           +---------+---------------+---------+-----------+----------+-----------------+ FV Prox  None                                         Age Indeterminate +---------+---------------+---------+-----------+----------+-----------------+ FV Mid   None                                         Age Indeterminate +---------+---------------+---------+-----------+----------+-----------------+ FV DistalNone  Not visualized    +---------+---------------+---------+-----------+----------+-----------------+ PFV      Full                                                           +---------+---------------+---------+-----------+----------+-----------------+ POP      None           No       No                   Not visualized    +---------+---------------+---------+-----------+----------+-----------------+ PTV      None                                         Not visualized    +---------+---------------+---------+-----------+----------+-----------------+ PERO     None                                         Not visualized    +---------+---------------+---------+-----------+----------+-----------------+     Summary: RIGHT: - There is no evidence of deep vein thrombosis in the lower extremity.  - No cystic structure found in the popliteal fossa.  LEFT: - Findings consistent with age indeterminate deep vein thrombosis involving the left common femoral vein, left femoral vein, left popliteal vein, left posterior tibial veins, and left peroneal veins. - No cystic structure found in the popliteal fossa. - attempted to visialize iliac area, not  visualized well.  *See table(s) above for measurements and observations. Electronically signed by Servando Snare MD on 07/06/2020 at 5:31:51 PM.    Final         Scheduled Meds: . Chlorhexidine Gluconate Cloth  6 each Topical Daily  . insulin aspart  0-15 Units Subcutaneous TID WC  . insulin glargine  10 Units Subcutaneous Daily   Continuous Infusions: . heparin 2,000 Units/hr (07/06/20 2047)     LOS: 2 days   Time spent: 52min  Brinkley Peet C Julann Mcgilvray, DO [facility]  If 7PM-7AM, please contact night-coverage [email or web address]  07/07/2020, 7:44 AM

## 2020-07-08 LAB — CBC
HCT: 38.3 % — ABNORMAL LOW (ref 39.0–52.0)
Hemoglobin: 12.7 g/dL — ABNORMAL LOW (ref 13.0–17.0)
MCH: 26.9 pg (ref 26.0–34.0)
MCHC: 33.2 g/dL (ref 30.0–36.0)
MCV: 81.1 fL (ref 80.0–100.0)
Platelets: 172 10*3/uL (ref 150–400)
RBC: 4.72 MIL/uL (ref 4.22–5.81)
RDW: 13 % (ref 11.5–15.5)
WBC: 5.8 10*3/uL (ref 4.0–10.5)
nRBC: 0 % (ref 0.0–0.2)

## 2020-07-08 LAB — HEPARIN LEVEL (UNFRACTIONATED): Heparin Unfractionated: 0.32 IU/mL (ref 0.30–0.70)

## 2020-07-08 LAB — GLUCOSE, CAPILLARY: Glucose-Capillary: 149 mg/dL — ABNORMAL HIGH (ref 70–99)

## 2020-07-08 MED ORDER — BLOOD GLUCOSE MONITOR KIT: PACK | 0 refills | Status: DC

## 2020-07-08 MED ORDER — PEN NEEDLES 32G X 4 MM MISC: 100.0000 | Freq: Every day | 0 refills | Status: DC

## 2020-07-08 MED ORDER — INSULIN GLARGINE 100 UNIT/ML ~~LOC~~ SOLN: 10.0000 [IU] | Freq: Every day | SUBCUTANEOUS | 0 refills | Status: DC

## 2020-07-08 MED ORDER — APIXABAN (ELIQUIS) VTE STARTER PACK (10MG AND 5MG): ORAL_TABLET | ORAL | 0 refills | Status: DC

## 2020-07-08 NOTE — Discharge Summary (Signed)
Physician Discharge Summary  Steven [name] HER:[id number] DOB: 05-22-1971 DOA: 07/05/2020  PCP: Shirline Frees, MD  Admit date: 07/05/2020 Discharge date: 07/08/2020  Admitted From: Home Home Disposition: Home  Recommendations for Outpatient Follow-up:  1. Follow up with PCP in 1-2 weeks 2. Follow-up with heme-onc as discussed  Home Health: None Equipment/Devices: None  Discharge Condition: Stable CODE STATUS: Full Diet recommendation: Diabetic diet  Brief/Interim Summary: 49 year old gentleman, with medical history of pulmonary embolism greater than a year ago, was on Eliquis, currently off anticoagulation. Past medical history to include hyperlipidemia, OSA, hypertension, diabetes. Patient had progressive symptoms of shortness of breath and dyspnea on exertion. He was unable to help around the house. Decision was made to seek evaluation in the ER. Upon presentation to the ED patient was hypotensive. He had a syncopal episode. Systolic blood pressure was in the 60s. At this time he has stabilized with heart rate in the 120s blood pressure systolic 856-3 10. He is able to talk in complete sentences. On 3 L nasal cannula O2 to maintain sats in the mid 90s. Wife is at bedside.  Patient met as above with acute hypoxic respiratory failure in the setting of acute, questionably recurrent unprovoked bilateral PE.  Patient had syncopal episode and hypotension requiring TPA per protocol patient was monitored in the ICU overnight, he has been weaned off oxygen for more than 24 hours at this point remains symptom-free.  Lengthy discussion at bedside about patient's recurrent PE, had previously undergone some genetic testing and markers with PCP, at this point would recommend heme-onc evaluation for further testing given clearly an underlying issue given recurrent PE with large left lower extremity DVT without clear provocation.  Continue Eliquis, patient is back on tapering dose after  completing TPA and heparin drip, close follow-up with PCP, patient is not nave to anticoagulation and understands the risks of anticoagulation and what to look out for in terms of symptoms of bleeding.  We have also placed the patient on low-dose insulin given his A1c of 9.5, will need to be titrated in the outpatient setting with PCP.  Patient otherwise stable and agreeable for discharge.  Discharge Diagnoses:  Active Problems:   Pulmonary embolism [facility])    Discharge Instructions  Discharge Instructions    Call MD for:  difficulty breathing, headache or visual disturbances   Complete by: As directed    Call MD for:  extreme fatigue   Complete by: As directed    Call MD for:  persistant dizziness or light-headedness   Complete by: As directed    Call MD for:  redness, tenderness, or signs of infection (pain, swelling, redness, odor or green/yellow discharge around incision site)   Complete by: As directed    Call MD for:  temperature >100.4   Complete by: As directed    Diet - low sodium heart healthy   Complete by: As directed    Diet Carb Modified   Complete by: As directed    Increase activity slowly   Complete by: As directed    Increase activity slowly   Complete by: As directed      Allergies as of 07/08/2020      Reactions   Contrast Media [iodinated Diagnostic Agents] Hives   As a 49 year old experienced hives and nausea      Medication List    STOP taking these medications   apixaban 5 MG Tabs tablet Commonly known as: Eliquis Replaced by: Apixaban Starter Pack (22m and 566m  losartan 50 MG tablet Commonly known as: COZAAR     TAKE these medications   acetaminophen 500 MG tablet Commonly known as: TYLENOL Take 1,000 mg by mouth every 6 (six) hours as needed for mild pain.   albuterol 108 (90 Base) MCG/ACT inhaler Commonly known as: VENTOLIN HFA Inhale 2 puffs into the lungs every 4 (four) hours as needed for wheezing.   ALPRAZolam 0.25 MG  tablet Commonly known as: XANAX Take 0.25 mg by mouth daily as needed for anxiety or sleep.   Apixaban Starter Pack (34m and 530m Commonly known as: ELIQUIS STARTER PACK Take as directed on package: start with two-48m8mablets twice daily for 7 days. On day 8, switch to one-48mg27mblet twice daily. Replaces: apixaban 5 MG Tabs tablet   blood glucose meter kit and supplies Kit Dispense based on patient and insurance preference. Use up to four times daily as directed. (FOR ICD-9 250.00, 250.01).   fenofibrate 160 MG tablet Take 160 mg by mouth daily.   insulin glargine 100 UNIT/ML injection Commonly known as: LANTUS Inject 0.1 mLs (10 Units total) into the skin daily.   meloxicam 15 MG tablet Commonly known as: MOBIC Take 15 mg by mouth at bedtime.   Pen Needles 32G X 4 MM Misc 100 Doses by Does not apply route daily.   tetrahydrozoline-zinc 0.05-0.25 % ophthalmic solution Commonly known as: VISINE-AC Place 2 drops into both eyes as needed (allergy).   Xigduo XR 01-999 MG Tb24 Generic drug: Dapagliflozin-metFORMIN HCl ER Take 2 tablets by mouth daily.       Allergies  Allergen Reactions  . Contrast Media [Iodinated Diagnostic Agents] Hives    As a 10 y60r old experienced hives and nausea    Consultations:  PCCM   Procedures/Studies: DG Chest 2 View  Result Date: 07/05/2020 CLINICAL DATA:  Chest pain EXAM: CHEST - 2 VIEW COMPARISON:  04/24/2019 FINDINGS: The heart size and mediastinal contours are within normal limits. Both lungs are clear. Probable enchondroma within the left humeral head. The visualized skeletal structures are otherwise unremarkable. IMPRESSION: No active cardiopulmonary disease. Electronically Signed   By: NichDavina Poke.   On: 07/05/2020 12:21   CT Angio Chest PE W/Cm &/Or Wo Cm  Result Date: 07/05/2020 CLINICAL DATA:  48 y47r old male with concern for pulmonary embolism. EXAM: CT ANGIOGRAPHY CHEST WITH CONTRAST TECHNIQUE: Multidetector CT  imaging of the chest was performed using the standard protocol during bolus administration of intravenous contrast. Multiplanar CT image reconstructions and MIPs were obtained to evaluate the vascular anatomy. CONTRAST:  748mL50mIPAQUE IOHEXOL 350 MG/ML SOLN COMPARISON:  Chest CT dated 04/24/2019. FINDINGS: Cardiovascular: There is no cardiomegaly or pericardial effusion. There is dilatation of the right heart chambers. The right ventricular lumen measures approximately 4.6 cm in diameter and the left ventricular lumen measures 2.2 cm with RV/LV ratio of 2.1. The thoracic aorta is unremarkable. Large bilateral pulmonary artery emboli involving the right main pulmonary artery extending into the lobar and segmental branches of the right upper and right lower lobes. Left upper and left lower lobe pulmonary artery emboli involving the lobar and segmental branches. Mediastinum/Nodes: There is no hilar or mediastinal adenopathy. The esophagus and the thyroid gland are grossly unremarkable. No mediastinal fluid collection. Lungs/Pleura: There is heterogeneous hazy density throughout the lungs, likely related to atelectasis or areas of air trapping. No consolidative changes. There is no pleural effusion pneumothorax. The central airways are patent. Upper Abdomen: Probable fatty liver. Musculoskeletal: Degenerative changes of the spine.  No acute osseous pathology. Review of the MIP images confirms the above findings. IMPRESSION: Positive for acute PE with of right heart strain (RV/LV Ratio = 2.1) consistent with at least submassive (intermediate risk) PE. The presence of right heart strain has been associated with an increased risk of morbidity and mortality. These results were called by telephone at the time of interpretation on 07/05/2020 at 7:18 pm to provider Marvia Pickles, who verbally acknowledged these results. Electronically Signed   By: Anner Crete M.D.   On: 07/05/2020 19:20   ECHOCARDIOGRAM  COMPLETE  Result Date: 07/06/2020  Lower Venous DVTStudy Indications: Pulmonary embolism.  Comparison Study: 04/25/19 LEV - Negative Performing Technologist: Abram Sander RVS  Examination Guidelines: A complete evaluation includes B-mode imaging, spectral Doppler, color Doppler, and power Doppler as needed of all accessible portions of each vessel. Bilateral testing is considered an integral part of a complete examination. Limited examinations for reoccurring indications may be performed as noted. The reflux portion of the exam is performed with the patient in reverse Trendelenburg.  +---------+---------------+---------+-----------+----------+--------------+ RIGHT    CompressibilityPhasicitySpontaneityPropertiesThrombus Aging +---------+---------------+---------+-----------+----------+--------------+ CFV      Full           Yes      Yes                                 +---------+---------------+---------+-----------+----------+--------------+ SFJ      Full                                                        +---------+---------------+---------+-----------+----------+--------------+ FV Prox  Full                                                        +---------+---------------+---------+-----------+----------+--------------+ FV Mid   Full                                                        +---------+---------------+---------+-----------+----------+--------------+ FV DistalFull                                                        +---------+---------------+---------+-----------+----------+--------------+ PFV      Full                                                        +---------+---------------+---------+-----------+----------+--------------+ POP      Full           Yes      Yes                                 +---------+---------------+---------+-----------+----------+--------------+  PTV      Full                                                         +---------+---------------+---------+-----------+----------+--------------+ PERO     Full                                                        +---------+---------------+---------+-----------+----------+--------------+   +---------+---------------+---------+-----------+----------+-----------------+ LEFT     CompressibilityPhasicitySpontaneityPropertiesThrombus Aging    +---------+---------------+---------+-----------+----------+-----------------+ CFV      Partial        No       No                   Age Indeterminate +---------+---------------+---------+-----------+----------+-----------------+ SFJ      Full                                                           +---------+---------------+---------+-----------+----------+-----------------+ FV Prox  None                                         Age Indeterminate +---------+---------------+---------+-----------+----------+-----------------+ FV Mid   None                                         Age Indeterminate +---------+---------------+---------+-----------+----------+-----------------+ FV DistalNone                                         Not visualized    +---------+---------------+---------+-----------+----------+-----------------+ PFV      Full                                                           +---------+---------------+---------+-----------+----------+-----------------+ POP      None           No       No                   Not visualized    +---------+---------------+---------+-----------+----------+-----------------+ PTV      None                                         Not visualized    +---------+---------------+---------+-----------+----------+-----------------+ PERO     None  Not visualized    +---------+---------------+---------+-----------+----------+-----------------+     Summary: RIGHT: - There is no evidence of deep  vein thrombosis in the lower extremity.  - No cystic structure found in the popliteal fossa.  LEFT: - Findings consistent with age indeterminate deep vein thrombosis involving the left common femoral vein, left femoral vein, left popliteal vein, left posterior tibial veins, and left peroneal veins. - No cystic structure found in the popliteal fossa. - attempted to visialize iliac area, not visualized well.  *See table(s) above for measurements and observations. Electronically signed by Servando Snare MD on 07/06/2020 at 5:31:51 PM.    Final      Subjective: No acute issues or events overnight denies nausea, vomiting, diarrhea, constipation, headache, fevers, chills.   Discharge Exam: Vitals:   07/08/20 0438 07/08/20 0759  BP: 121/87 127/81  Pulse: 80 82  Resp: 18 20  Temp: 97.8 F (36.6 C) 98 F (36.7 C)  SpO2: 99% 98%   Vitals:   07/07/20 2105 07/08/20 0028 07/08/20 0438 07/08/20 0759  BP: (!) 143/89 129/90 121/87 127/81  Pulse: 89 79 80 82  Resp: 18 18 18 20   Temp: 98.3 F (36.8 C) 97.8 F (36.6 C) 97.8 F (36.6 C) 98 F (36.7 C)  TempSrc: Oral Oral Oral Oral  SpO2: 100% 99% 99% 98%  Weight:   100.9 kg   Height:        General: Pt is alert, awake, not in acute distress Cardiovascular: RRR, S1/S2 +, no rubs, no gallops Respiratory: CTA bilaterally, no wheezing, no rhonchi Abdominal: Soft, NT, ND, bowel sounds + Extremities: no edema, no cyanosis    The results of significant diagnostics from this hospitalization (including imaging, microbiology, ancillary and laboratory) are listed below for reference.     Microbiology: Recent Results (from the past 240 hour(s))  Resp Panel by RT PCR (RSV, Flu A&B, Covid) - Nasopharyngeal Swab     Status: None   Collection Time: 07/05/20  5:06 PM   Specimen: Nasopharyngeal Swab  Result Value Ref Range Status   SARS Coronavirus 2 by RT PCR NEGATIVE NEGATIVE Final    Comment: (NOTE) SARS-CoV-2 target nucleic acids are NOT  DETECTED.  The SARS-CoV-2 RNA is generally detectable in upper respiratoy specimens during the acute phase of infection. The lowest concentration of SARS-CoV-2 viral copies this assay can detect is 131 copies/mL. A negative result does not preclude SARS-Cov-2 infection and should not be used as the sole basis for treatment or other patient management decisions. A negative result may occur with  improper specimen collection/handling, submission of specimen other than nasopharyngeal swab, presence of viral mutation(s) within the areas targeted by this assay, and inadequate number of viral copies (<131 copies/mL). A negative result must be combined with clinical observations, patient history, and epidemiological information. The expected result is Negative.  Fact Sheet for Patients:  PinkCheek.be  Fact Sheet for Healthcare Providers:  GravelBags.it  This test is no t yet approved or cleared by the [place] FDA and  has been authorized for detection and/or diagnosis of SARS-CoV-2 by FDA under an Emergency Use Authorization (EUA). This EUA will remain  in effect (meaning this test can be used) for the duration of the COVID-19 declaration under Section 564(b)(1) of the Act, 21 U.S.C. section 360bbb-3(b)(1), unless the authorization is terminated or revoked sooner.     Influenza A by PCR NEGATIVE NEGATIVE Final   Influenza B by PCR NEGATIVE NEGATIVE Final    Comment: (NOTE) The Xpert Xpress  SARS-CoV-2/FLU/RSV assay is intended as an aid in  the diagnosis of influenza from Nasopharyngeal swab specimens and  should not be used as a sole basis for treatment. Nasal washings and  aspirates are unacceptable for Xpert Xpress SARS-CoV-2/FLU/RSV  testing.  Fact Sheet for Patients: PinkCheek.be  Fact Sheet for Healthcare Providers: GravelBags.it  This test is not yet  approved or cleared by the [place] FDA and  has been authorized for detection and/or diagnosis of SARS-CoV-2 by  FDA under an Emergency Use Authorization (EUA). This EUA will remain  in effect (meaning this test can be used) for the duration of the  Covid-19 declaration under Section 564(b)(1) of the Act, 21  U.S.C. section 360bbb-3(b)(1), unless the authorization is  terminated or revoked.    Respiratory Syncytial Virus by PCR NEGATIVE NEGATIVE Final    Comment: (NOTE) Fact Sheet for Patients: PinkCheek.be  Fact Sheet for Healthcare Providers: GravelBags.it  This test is not yet approved or cleared by the [place] FDA and  has been authorized for detection and/or diagnosis of SARS-CoV-2 by  FDA under an Emergency Use Authorization (EUA). This EUA will remain  in effect (meaning this test can be used) for the duration of the  COVID-19 declaration under Section 564(b)(1) of the Act, 21 U.S.C.  section 360bbb-3(b)(1), unless the authorization is terminated or  revoked. Performed at [facility], [place] [address] [place], [place] [address]   MRSA PCR Screening     Status: None   Collection Time: 07/05/20 11:42 PM   Specimen: Nasal Mucosa; Nasopharyngeal  Result Value Ref Range Status   MRSA by PCR NEGATIVE NEGATIVE Final    Comment:        The GeneXpert MRSA Assay (FDA approved for NASAL specimens only), is one component of a comprehensive MRSA colonization surveillance program. It is not intended to diagnose MRSA infection nor to guide or monitor treatment for MRSA infections. Performed at [facility], [place] [address] [place], [place] [address]      Labs: BNP (last 3 results) Recent Labs    07/06/20 0801  BNP 11.0   Basic Metabolic Panel: Recent Labs  Lab 07/05/20 1145 07/06/20 1548  NA 137 137  K 5.7* 3.7  CL 103 105  CO2 19* 22  GLUCOSE 409* 267*  BUN 19 19  CREATININE 1.29*  1.13  CALCIUM 9.1 8.4*   Liver Function Tests: Recent Labs  Lab 07/05/20 1145  AST 80*  ALT 48*  ALKPHOS 62  BILITOT 2.1*  PROT 6.5  ALBUMIN 3.9   No results for input(s): LIPASE, AMYLASE in the last 168 hours. No results for input(s): AMMONIA in the last 168 hours. CBC: Recent Labs  Lab 07/05/20 1145 07/06/20 0801 07/07/20 0110 07/08/20 0237  WBC 11.9* 7.9 7.2 5.8  NEUTROABS 8.6*  --   --   --   HGB 15.6 13.4 12.6* 12.7*  HCT 47.6 40.0 38.7* 38.3*  MCV 82.2 82.5 82.5 81.1  PLT 191 166 157 172   Cardiac Enzymes: No results for input(s): CKTOTAL, CKMB, CKMBINDEX, TROPONINI in the last 168 hours. BNP: Invalid input(s): POCBNP CBG: Recent Labs  Lab 07/07/20 0620 07/07/20 1118 07/07/20 1621 07/07/20 2106 07/08/20 0700  GLUCAP 160* 206* 162* 172* 149*   D-Dimer No results for input(s): DDIMER in the last 72 hours. Hgb A1c Recent Labs    07/06/20 0030  HGBA1C 9.5*   Lipid Profile No results for input(s): CHOL, HDL, LDLCALC, TRIG, CHOLHDL, LDLDIRECT in  the last 72 hours. Thyroid function studies No results for input(s): TSH, T4TOTAL, T3FREE, THYROIDAB in the last 72 hours.  Invalid input(s): FREET3 Anemia work up No results for input(s): VITAMINB12, FOLATE, FERRITIN, TIBC, IRON, RETICCTPCT in the last 72 hours. Urinalysis No results found for: COLORURINE, APPEARANCEUR, [place], [place], [place], [place], [place], [place], PROTEINUR, UROBILINOGEN, NITRITE, LEUKOCYTESUR Sepsis Labs Invalid input(s): PROCALCITONIN,  WBC,  LACTICIDVEN Microbiology Recent Results (from the past 240 hour(s))  Resp Panel by RT PCR (RSV, Flu A&B, Covid) - Nasopharyngeal Swab     Status: None   Collection Time: 07/05/20  5:06 PM   Specimen: Nasopharyngeal Swab  Result Value Ref Range Status   SARS Coronavirus 2 by RT PCR NEGATIVE NEGATIVE Final    Comment: (NOTE) SARS-CoV-2 target nucleic acids are NOT DETECTED.  The SARS-CoV-2 RNA is generally detectable in upper  respiratoy specimens during the acute phase of infection. The lowest concentration of SARS-CoV-2 viral copies this assay can detect is 131 copies/mL. A negative result does not preclude SARS-Cov-2 infection and should not be used as the sole basis for treatment or other patient management decisions. A negative result may occur with  improper specimen collection/handling, submission of specimen other than nasopharyngeal swab, presence of viral mutation(s) within the areas targeted by this assay, and inadequate number of viral copies (<131 copies/mL). A negative result must be combined with clinical observations, patient history, and epidemiological information. The expected result is Negative.  Fact Sheet for Patients:  PinkCheek.be  Fact Sheet for Healthcare Providers:  GravelBags.it  This test is no t yet approved or cleared by the [place] FDA and  has been authorized for detection and/or diagnosis of SARS-CoV-2 by FDA under an Emergency Use Authorization (EUA). This EUA will remain  in effect (meaning this test can be used) for the duration of the COVID-19 declaration under Section 564(b)(1) of the Act, 21 U.S.C. section 360bbb-3(b)(1), unless the authorization is terminated or revoked sooner.     Influenza A by PCR NEGATIVE NEGATIVE Final   Influenza B by PCR NEGATIVE NEGATIVE Final    Comment: (NOTE) The Xpert Xpress SARS-CoV-2/FLU/RSV assay is intended as an aid in  the diagnosis of influenza from Nasopharyngeal swab specimens and  should not be used as a sole basis for treatment. Nasal washings and  aspirates are unacceptable for Xpert Xpress SARS-CoV-2/FLU/RSV  testing.  Fact Sheet for Patients: PinkCheek.be  Fact Sheet for Healthcare Providers: GravelBags.it  This test is not yet approved or cleared by the [place] FDA and  has been  authorized for detection and/or diagnosis of SARS-CoV-2 by  FDA under an Emergency Use Authorization (EUA). This EUA will remain  in effect (meaning this test can be used) for the duration of the  Covid-19 declaration under Section 564(b)(1) of the Act, 21  U.S.C. section 360bbb-3(b)(1), unless the authorization is  terminated or revoked.    Respiratory Syncytial Virus by PCR NEGATIVE NEGATIVE Final    Comment: (NOTE) Fact Sheet for Patients: PinkCheek.be  Fact Sheet for Healthcare Providers: GravelBags.it  This test is not yet approved or cleared by the [place] FDA and  has been authorized for detection and/or diagnosis of SARS-CoV-2 by  FDA under an Emergency Use Authorization (EUA). This EUA will remain  in effect (meaning this test can be used) for the duration of the  COVID-19 declaration under Section 564(b)(1) of the Act, 21 U.S.C.  section 360bbb-3(b)(1), unless the authorization is terminated or  revoked. Performed at [facility], [address], [place], [place] [address]   MRSA PCR Screening     Status: None   Collection Time: 07/05/20 11:42 PM   Specimen: Nasal Mucosa; Nasopharyngeal  Result Value Ref Range Status   MRSA by PCR NEGATIVE NEGATIVE Final    Comment:        The GeneXpert MRSA Assay (FDA approved for NASAL specimens only), is one component of a comprehensive MRSA colonization surveillance program. It is not intended to diagnose MRSA infection nor to guide or monitor treatment for MRSA infections. Performed at [facility], [place] [address] [place],  [address]      Time coordinating discharge: Over 30 minutes  SIGNED:   Little Ishikawa, DO [facility] 07/08/2020, 12:02 PM Pager   If 7PM-7AM, please contact night-coverage [email or web address]

## 2020-07-08 NOTE — Progress Notes (Signed)
[place] for Heparin  Indication: pulmonary embolus, s/p alteplase  Allergies  Allergen Reactions  . Contrast Media [Iodinated Diagnostic Agents] Hives    As a 49 year old experienced hives and nausea    Patient Measurements: Height: 5\' 10"  (177.8 cm) Weight: 100.9 kg (222 lb 6.4 oz) (scale a) IBW/kg (Calculated) : 73  Heparin dosing weight: 91.8 kg  Vital Signs: Temp: 97.8 F (36.6 C) (10/10 0438) Temp Source: Oral (10/10 0438) BP: 121/87 (10/10 0438) Pulse Rate: 80 (10/10 0438)  Labs: Recent Labs    07/05/20 1145 07/05/20 1145 07/05/20 1351 07/06/20 0030 07/06/20 0801 07/06/20 0801 07/06/20 1548 07/06/20 1548 07/06/20 2155 07/07/20 0110 07/08/20 0237  HGB 15.6   < >  --   --  13.4   < >  --   --   --  12.6* 12.7*  HCT 47.6   < >  --   --  40.0  --   --   --   --  38.7* 38.3*  PLT 191   < >  --   --  166  --   --   --   --  157 172  APTT  --   --   --  36  --   --   --   --   --   --   --   HEPARINUNFRC  --   --   --   --  <0.10*   < > 0.35   < > 0.35 0.33 0.32  CREATININE 1.29*  --   --   --   --   --  1.13  --   --   --   --   TROPONINIHS 24*  --  68*  --   --   --   --   --   --   --   --    < > = values in this interval not displayed.    Estimated Creatinine Clearance: 95.2 mL/min (by C-G formula based on SCr of 1.13 mg/dL).  Assessment: 49 y/o Male with sub-massive PE s/p tpa for heparin.  Heparin level within goal range this AM.  No overt bleeding or complications noted. Transitioning to apixaban today.  Goal of Therapy:  Monitor platelets by anticoagulation protocol: Yes   Plan:  D/c heparin Initiate apixaban 10 mg BID x 7 days, then 5 mg BID Monitor CBC, s/sx bleeding  Mercy Riding, PharmD PGY1 Acute Care Pharmacy Resident Please refer to [facility] for unit-specific pharmacist

## 2020-07-08 NOTE — Plan of Care (Signed)
  Problem: Education: Goal: Knowledge of General Education information will improve Description: Including pain rating scale, medication(s)/side effects and non-pharmacologic comfort measures Outcome: Adequate for Discharge   Problem: Health Behavior/Discharge Planning: Goal: Ability to manage health-related needs will improve Outcome: Adequate for Discharge   Problem: Clinical Measurements: Goal: Ability to maintain clinical measurements within normal limits will improve Outcome: Adequate for Discharge Goal: Diagnostic test results will improve Outcome: Adequate for Discharge Goal: Cardiovascular complication will be avoided Outcome: Adequate for Discharge   Problem: Activity: Goal: Risk for activity intolerance will decrease Outcome: Adequate for Discharge

## 2020-07-10 LAB — ECHOCARDIOGRAM COMPLETE
Area-P 1/2: 4.8 cm2
Calc EF: 70.8 %
Height: 69 in
S' Lateral: 1.9 cm
Single Plane A2C EF: 68.1 %
Single Plane A4C EF: 74.1 %
Weight: 3516.78 oz

## 2020-07-16 DIAGNOSIS — I10 Essential (primary) hypertension: Secondary | ICD-10-CM | POA: Diagnosis not present

## 2020-07-16 DIAGNOSIS — G4730 Sleep apnea, unspecified: Secondary | ICD-10-CM | POA: Diagnosis not present

## 2020-07-16 DIAGNOSIS — E1165 Type 2 diabetes mellitus with hyperglycemia: Secondary | ICD-10-CM | POA: Diagnosis not present

## 2020-07-16 DIAGNOSIS — L03317 Cellulitis of buttock: Secondary | ICD-10-CM | POA: Diagnosis not present

## 2020-09-23 DIAGNOSIS — H6091 Unspecified otitis externa, right ear: Secondary | ICD-10-CM | POA: Diagnosis not present

## 2020-09-27 DIAGNOSIS — H60501 Unspecified acute noninfective otitis externa, right ear: Secondary | ICD-10-CM | POA: Diagnosis not present

## 2020-10-05 ENCOUNTER — Telehealth: Payer: Self-pay | Admitting: Hematology and Oncology

## 2020-10-05 NOTE — Telephone Encounter (Signed)
Received a new hem referral from Dr. Kenton Kingfisher for hx pf pe. [name] returned my call and has been scheduled to see Dr. Chryl Heck on 1/19 at 9am. Pt aware to arrive 20 minutes early.

## 2020-10-17 ENCOUNTER — Encounter: Payer: Self-pay | Admitting: Hematology and Oncology

## 2020-10-17 ENCOUNTER — Inpatient Hospital Stay: Payer: BC Managed Care – PPO | Attending: Hematology and Oncology | Admitting: Hematology and Oncology

## 2020-10-17 ENCOUNTER — Other Ambulatory Visit: Payer: Self-pay

## 2020-10-17 ENCOUNTER — Inpatient Hospital Stay: Payer: BC Managed Care – PPO

## 2020-10-17 VITALS — BP 145/83 | HR 98 | Temp 97.0°F | Resp 16 | Wt 218.6 lb

## 2020-10-17 DIAGNOSIS — E669 Obesity, unspecified: Secondary | ICD-10-CM | POA: Diagnosis not present

## 2020-10-17 DIAGNOSIS — E119 Type 2 diabetes mellitus without complications: Secondary | ICD-10-CM | POA: Diagnosis not present

## 2020-10-17 DIAGNOSIS — Z7952 Long term (current) use of systemic steroids: Secondary | ICD-10-CM | POA: Insufficient documentation

## 2020-10-17 DIAGNOSIS — Z79899 Other long term (current) drug therapy: Secondary | ICD-10-CM | POA: Diagnosis not present

## 2020-10-17 DIAGNOSIS — Z7901 Long term (current) use of anticoagulants: Secondary | ICD-10-CM | POA: Insufficient documentation

## 2020-10-17 DIAGNOSIS — J45909 Unspecified asthma, uncomplicated: Secondary | ICD-10-CM | POA: Diagnosis not present

## 2020-10-17 DIAGNOSIS — E785 Hyperlipidemia, unspecified: Secondary | ICD-10-CM | POA: Diagnosis not present

## 2020-10-17 DIAGNOSIS — Z833 Family history of diabetes mellitus: Secondary | ICD-10-CM | POA: Diagnosis not present

## 2020-10-17 DIAGNOSIS — Z803 Family history of malignant neoplasm of breast: Secondary | ICD-10-CM | POA: Diagnosis not present

## 2020-10-17 DIAGNOSIS — F419 Anxiety disorder, unspecified: Secondary | ICD-10-CM | POA: Diagnosis not present

## 2020-10-17 DIAGNOSIS — Z794 Long term (current) use of insulin: Secondary | ICD-10-CM

## 2020-10-17 DIAGNOSIS — G4733 Obstructive sleep apnea (adult) (pediatric): Secondary | ICD-10-CM

## 2020-10-17 DIAGNOSIS — Z86711 Personal history of pulmonary embolism: Secondary | ICD-10-CM

## 2020-10-17 DIAGNOSIS — I2699 Other pulmonary embolism without acute cor pulmonale: Secondary | ICD-10-CM

## 2020-10-17 MED ORDER — PREDNISONE 50 MG PO TABS: ORAL_TABLET | ORAL | 0 refills | Status: DC

## 2020-10-17 NOTE — Progress Notes (Signed)
[place] NOTE  Patient Care Team: Shirline Frees, MD as PCP - General (Family Medicine)  CHIEF COMPLAINTS/PURPOSE OF CONSULTATION:  Bilateral PE  ASSESSMENT & PLAN:  No problem-specific Assessment & Plan notes found for this encounter.  This is a 50 year old male patient with type 2 diabetes, dyslipidemia, obesity referred to hematology for evaluation and recommendations regarding anticoagulation for bilateral PE.  1. Unprovoked bilateral PE,  Hypercoagulable work-up in July 2020 is unremarkable. There was some concern for inhibitor on lupus anticoagulant testing likely from concurrent use of Eliquis. I recommended repeating antiphospholipid antibody panel, added JAK2 V6 24F mutation and PNH panel for additional hypercoagulable work-up. Have also recommended considering CT abdomen pelvis to rule out any occult malignancy which can cause a hypercoagulable state.  His CT PE did not suggest any primary malignancy in the chest. Despite the results, have recommended lifelong anticoagulation, okay to continue Xarelto at this time.  If he has another episode of DVT or PE, we may consider lower molecular weight heparin.  2. type 2 diabetes mellitus, non-insulin-dependent, continue to follow-up with your primary care physician.  3. obesity, advised healthy diet and weight control, regular exercise  He is agreeable to all these recommendations Prednisone pre contrast ordered for CT abdomen pelvis since he has some allergy to iodine. Thank you for consulting [place] in the care of this patient.  Please do not hesitate to contact us with any additional questions or concerns  Orders Placed This Encounter  Procedures  . CT ABDOMEN PELVIS W CONTRAST    Standing Status:   Future    Standing Expiration Date:   10/17/2021    Order Specific Question:   If indicated for the ordered procedure, I authorize the administration of contrast media per Radiology protocol    Answer:   Yes     Order Specific Question:   Preferred imaging location?    Answer:   [facility]    Order Specific Question:   Radiology Contrast Protocol - do NOT remove file path    Answer:   [email or web address]  . PNH Profile (-High Sensitivity)  . Lupus anticoagulant panel    Standing Status:   Future    Standing Expiration Date:   10/17/2021  . Cardiolipin antibodies, IgG, IgM, IgA*    Standing Status:   Future    Standing Expiration Date:   10/17/2021  . Beta-2-glycoprotein i abs, IgG/M/A    Standing Status:   Future    Standing Expiration Date:   10/17/2021  . Jak 2 V624F ([facility])    Standing Status:   Future    Standing Expiration Date:   10/17/2021     HISTORY OF PRESENTING ILLNESS:  Steven [name] 50 y.o. male is here because of personal history of PE  Chronology  This is a very pleasant 50 year old male patient with past medical history significant for type 1 diabetes mellitus, non-insulin-dependent, obstructive sleep apnea, hyperlipidemia, anxiety referred to hematology for anticoagulation recommendations given his history of bilateral PE.  First episode happened in July 2020 when he presented with shortness of breath, chest pain with deep inspiration and had to go to the hospital.  He absolutely denies any provoking factors at that time.  He does have a secondary job but he goes up and down the stairs every hour or so. He was found to have bilateral PE at that time started on Eliquis, completed 6 months of anticoagulation.  Then in October 2021, he had similar symptoms where  he was feeling very short of breath, chest pain on deep inspiration but this time he also had a syncopal episode, his wife called 911 and he was brought to the hospital which again showed bilateral PE with right heart strain.  He denies any provoking factors for the second episode as well.  No trauma, surgeries, long periods of immobilization in general.  No testosterone  supplementation. No family history of thromboembolic episodes or sudden deaths.  He did not lose a few pounds over the past year but otherwise did not notice any change in bowel habits, change in urinary habits or other health complaints.  He does have a good appetite.  His shortness of breath has mostly resolved although he may get winded with exertion. Family history significant for breast cancer in mom, maternal aunt and maternal grandmother.  No other cancers. Rest of the pertinent review of systems reviewed and unremarkable at this time.  REVIEW OF SYSTEMS:   Constitutional: Denies fevers, chills or abnormal night sweats Eyes: Denies blurriness of vision, double vision or watery eyes Ears, nose, mouth, throat, and face: Denies mucositis or sore throat Respiratory: Denies cough, dyspnea or wheezes Cardiovascular: Denies palpitation, chest discomfort or lower extremity swelling Gastrointestinal:  Denies nausea, heartburn or change in bowel habits Skin: Denies abnormal skin rashes Lymphatics: Denies new lymphadenopathy or easy bruising Neurological:Denies numbness, tingling or new weaknesses Behavioral/Psych: Mood is stable, no new changes  All other systems were reviewed with the patient and are negative.  MEDICAL HISTORY:  Past Medical History:  Diagnosis Date  . Anxiety   . Asthma   . Diabetes mellitus without complication ([place])   . HLD (hyperlipidemia)   . OSA (obstructive sleep apnea)     SURGICAL HISTORY: Past Surgical History:  Procedure Laterality Date  . APPENDECTOMY      SOCIAL HISTORY: Social History   Socioeconomic History  . Marital status: Married    Spouse name: Not on file  . Number of children: Not on file  . Years of education: Not on file  . Highest education level: Not on file  Occupational History  . Not on file  Tobacco Use  . Smoking status: Never Smoker  . Smokeless tobacco: Never Used  Substance and Sexual Activity  . Alcohol use: Not  Currently  . Drug use: Never  . Sexual activity: Not on file  Other Topics Concern  . Not on file  Social History Narrative  . Not on file   Social Determinants of Health   Financial Resource Strain: Not on file  Food Insecurity: Not on file  Transportation Needs: Not on file  Physical Activity: Not on file  Stress: Not on file  Social Connections: Not on file  Intimate Partner Violence: Not on file    FAMILY HISTORY: Family History  Problem Relation Age of Onset  . Diabetes Mellitus II Mother   . Breast cancer Mother 29  . Thrombophlebitis Father   . Breast cancer Maternal Grandmother 21  . Breast cancer Maternal Aunt 60    ALLERGIES:  is allergic to contrast media [iodinated diagnostic agents].  MEDICATIONS:  Current Outpatient Medications  Medication Sig Dispense Refill  . predniSONE (DELTASONE) 50 MG tablet Take 1 pill at 13 hours, 7 hours and 1 hour before CT scan 3 tablet 0  . acetaminophen (TYLENOL) 500 MG tablet Take 1,000 mg by mouth every 6 (six) hours as needed for mild pain.    Marland Kitchen albuterol (VENTOLIN HFA) 108 (90 Base) MCG/ACT inhaler  Inhale 2 puffs into the lungs every 4 (four) hours as needed for wheezing.    Marland Kitchen ALPRAZolam (XANAX) 0.25 MG tablet Take 0.25 mg by mouth daily as needed for anxiety or sleep.    . blood glucose meter kit and supplies KIT Dispense based on patient and insurance preference. Use up to four times daily as directed. (FOR ICD-9 250.00, 250.01). 1 each 0  . fenofibrate 160 MG tablet Take 160 mg by mouth daily.    . meloxicam (MOBIC) 15 MG tablet Take 15 mg by mouth at bedtime.     Marland Kitchen tetrahydrozoline-zinc (VISINE-AC) 0.05-0.25 % ophthalmic solution Place 2 drops into both eyes as needed (allergy).    Steven [name] 20 MG TABS tablet Take 20 mg by mouth daily.    Marland Kitchen XIGDUO XR 01-999 MG TB24 Take 2 tablets by mouth daily.     No current facility-administered medications for this visit.     PHYSICAL EXAMINATION: ECOG PERFORMANCE STATUS: 1 -  Symptomatic but completely ambulatory  Vitals:   10/17/20 0916  BP: (S) (!) 145/83  Pulse: 98  Resp: 16  Temp: (!) 97 F (36.1 C)  SpO2: 99%   Filed Weights   10/17/20 0916  Weight: 218 lb 9.6 oz (99.2 kg)    GENERAL:alert, no distress and comfortable SKIN: skin color, texture, turgor are normal, no rashes or significant lesions EYES: normal, conjunctiva are pink and non-injected, sclera clear OROPHARYNX:no exudate, no erythema and lips, buccal mucosa, and tongue normal  NECK: supple, thyroid normal size, non-tender, without nodularity LYMPH:  no palpable lymphadenopathy in the cervical, axillary or inguinal LUNGS: clear to auscultation and percussion with normal breathing effort HEART: regular rate & rhythm and no murmurs and no lower extremity edema ABDOMEN:abdomen soft, non-tender and normal bowel sounds Musculoskeletal:no cyanosis of digits and no clubbing  PSYCH: alert & oriented x 3 with fluent speech NEURO: no focal motor/sensory deficits  LABORATORY DATA:  I have reviewed the data as listed Lab Results  Component Value Date   WBC 5.8 07/08/2020   HGB 12.7 (L) 07/08/2020   HCT 38.3 (L) 07/08/2020   MCV 81.1 07/08/2020   PLT 172 07/08/2020     Chemistry      Component Value Date/Time   NA 137 07/06/2020 1548   K 3.7 07/06/2020 1548   CL 105 07/06/2020 1548   CO2 22 07/06/2020 1548   BUN 19 07/06/2020 1548   CREATININE 1.13 07/06/2020 1548      Component Value Date/Time   CALCIUM 8.4 (L) 07/06/2020 1548   ALKPHOS 62 07/05/2020 1145   AST 80 (H) 07/05/2020 1145   ALT 48 (H) 07/05/2020 1145   BILITOT 2.1 (H) 07/05/2020 1145     07/05/2020  IMPRESSION: Positive for acute PE with of right heart strain (RV/LV Ratio = 2.1) consistent with at least submassive (intermediate risk) PE. The presence of right heart strain has been associated with an increased risk of morbidity and mortality.  These results were called by telephone at the time of  interpretation on 07/05/2020 at 7:18 pm to provider Marvia Pickles, who verbally acknowledged these results.  04/24/2019  IMPRESSION:  1. Positive for acute bilateral right greater than left pulmonary emboli. Positive for acute PE with CT evidence of right heart strain (RV/LV Ratio = 1.0) consistent with at least submassive (intermediate risk) PE. The presence of right heart strain has been associated with an increased risk of morbidity and mortality. Please activate Code PE by paging [phone].  Critical  Value/emergent results were called by telephone at the time of interpretation on 04/24/2019 at 8:55 pm to Dr. Julianne Rice , who verbally acknowledged these results.  Hypercoagulable work up negative in July 2020 Will repeat APLS panel, JAK 2 and PNH panel today. We will also order CT abdomen pelvis with contrast to rule out occult malignancy given unexplained 2 major episodes of unprovoked PE.  RADIOGRAPHIC STUDIES: I have personally reviewed the radiological images as listed and agreed with the findings in the report. No results found.  All questions were answered. The patient knows to call the clinic with any problems, questions or concerns. I spent 45 minutes in the care of this patient including H and P, review of records, counseling and coordination of care.     [name], MD 10/17/2020 9:54 AM

## 2020-10-19 ENCOUNTER — Telehealth: Payer: Self-pay

## 2020-10-19 LAB — CARDIOLIPIN ANTIBODIES, IGG, IGM, IGA
Anticardiolipin IgA: <9 APL U/mL (ref 0–11)
Anticardiolipin IgG: <9 GPL U/mL (ref 0–14)
Anticardiolipin IgM: <9 MPL U/mL (ref 0–12)

## 2020-10-19 LAB — DRVVT MIX: dRVVT Mix: 49.6 s — ABNORMAL HIGH (ref 0.0–40.4)

## 2020-10-19 LAB — LUPUS ANTICOAGULANT PANEL
DRVVT: 72.3 s — ABNORMAL HIGH (ref 0.0–47.0)
PTT Lupus Anticoagulant: 40.5 s (ref 0.0–51.9)

## 2020-10-19 LAB — BETA-2-GLYCOPROTEIN I ABS, IGG/M/A
Beta-2 Glyco I IgG: <9 GPI IgG units (ref 0–20)
Beta-2-Glycoprotein I IgA: <9 GPI IgA units (ref 0–25)
Beta-2-Glycoprotein I IgM: <9 GPI IgM units (ref 0–32)

## 2020-10-19 LAB — DRVVT CONFIRM: dRVVT Confirm: 1.5 ratio — ABNORMAL HIGH (ref 0.8–1.2)

## 2020-10-19 NOTE — Telephone Encounter (Signed)
Per Dr. Chryl Heck "Lupus anticoagulant likely abnormal because of concomitant xarelto use. Other tests unremarkable, one of the test is pending".

## 2020-10-19 NOTE — Telephone Encounter (Signed)
I spoke with the pt and advised as indicated. Pt also shared he has not heard from the Radiology to schedule his CT scan, therefore I provided him with the phone number to that department to get it scheduled.

## 2020-10-22 LAB — PNH PROFILE (-HIGH SENSITIVITY)

## 2020-10-23 ENCOUNTER — Inpatient Hospital Stay: Payer: BC Managed Care – PPO

## 2020-10-23 ENCOUNTER — Other Ambulatory Visit: Payer: Self-pay

## 2020-10-23 ENCOUNTER — Other Ambulatory Visit: Payer: Self-pay | Admitting: Hematology and Oncology

## 2020-10-23 DIAGNOSIS — I2699 Other pulmonary embolism without acute cor pulmonale: Secondary | ICD-10-CM

## 2020-10-29 LAB — JAK 2 V617F (GENPATH)

## 2020-11-20 DIAGNOSIS — E7800 Pure hypercholesterolemia, unspecified: Secondary | ICD-10-CM | POA: Diagnosis not present

## 2020-11-20 DIAGNOSIS — G4730 Sleep apnea, unspecified: Secondary | ICD-10-CM | POA: Diagnosis not present

## 2020-11-20 DIAGNOSIS — I10 Essential (primary) hypertension: Secondary | ICD-10-CM | POA: Diagnosis not present

## 2020-11-20 DIAGNOSIS — E1165 Type 2 diabetes mellitus with hyperglycemia: Secondary | ICD-10-CM | POA: Diagnosis not present

## 2020-11-22 ENCOUNTER — Other Ambulatory Visit: Payer: Self-pay | Admitting: *Deleted

## 2020-11-22 ENCOUNTER — Telehealth: Payer: Self-pay | Admitting: *Deleted

## 2020-11-22 MED ORDER — PREDNISONE 50 MG PO TABS: ORAL_TABLET | ORAL | 0 refills | Status: DC

## 2020-11-22 NOTE — Telephone Encounter (Signed)
TCT patient regarding prep for his CT scan scheduled for tomorrow. Pt is allergic to IV contrast. Prednisone 50 mg has been sent to his pharmacy. Pt  To take 50 mg tab 13 hours, 7 hours and 1 hour prior to scan. Pt also to take Benadryl 50 mg 1 hour before scan.  No answer but was able to leave vm message for pt. Advised to call back with any questions or concerns.

## 2020-11-23 ENCOUNTER — Other Ambulatory Visit: Payer: Self-pay

## 2020-11-23 ENCOUNTER — Ambulatory Visit (HOSPITAL_COMMUNITY)
Admission: RE | Admit: 2020-11-23 | Discharge: 2020-11-23 | Disposition: A | Discharge status: Home / Self Care | Payer: BC Managed Care – PPO | Source: Ambulatory Visit | Attending: Hematology and Oncology | Admitting: Hematology and Oncology

## 2020-11-23 ENCOUNTER — Encounter (HOSPITAL_COMMUNITY): Payer: Self-pay

## 2020-11-23 DIAGNOSIS — I2699 Other pulmonary embolism without acute cor pulmonale: Secondary | ICD-10-CM | POA: Insufficient documentation

## 2020-11-23 DIAGNOSIS — K760 Fatty (change of) liver, not elsewhere classified: Secondary | ICD-10-CM | POA: Diagnosis not present

## 2020-11-23 HISTORY — DX: Essential (primary) hypertension: I10

## 2020-11-23 LAB — POCT I-STAT CREATININE: Creatinine, Ser: 1.1 mg/dL (ref 0.61–1.24)

## 2020-11-23 MED ORDER — IOHEXOL 300 MG/ML  SOLN
100.0000 mL | Freq: Once | INTRAMUSCULAR | Status: AC | PRN
  Administered 2020-11-23: 100 mL via INTRAVENOUS

## 2020-11-26 ENCOUNTER — Other Ambulatory Visit: Payer: Self-pay | Admitting: Hematology and Oncology

## 2020-11-26 DIAGNOSIS — R935 Abnormal findings on diagnostic imaging of other abdominal regions, including retroperitoneum: Secondary | ICD-10-CM

## 2020-11-26 DIAGNOSIS — I2699 Other pulmonary embolism without acute cor pulmonale: Secondary | ICD-10-CM

## 2020-11-26 NOTE — Progress Notes (Addendum)
Called the patient back with the CT results. Discussed about the 2 mm kidney stone, no symptoms. Recommended weight loss for the hepatic steatosis. No bowel symptoms or FH of colon cancer. I suggested referral to GI for consideration of colonoscopy Referral placed.  He was asked to contact us again if no one from the GI office calls him back for FU. He expressed understanding of all the recommendations.  Brisia Schuermann

## 2020-12-17 ENCOUNTER — Telehealth: Payer: Self-pay | Admitting: Gastroenterology

## 2020-12-17 NOTE — Telephone Encounter (Signed)
Hi Dr. Rush Landmark,  We received a referral from the [facility] for patient to get an evaluation for diagnosis: Bilateral pulmonary embolism also has abnormal CT results suggesting some asymmetric soft tissue along the left side of rectum. Also had an unexplained PE>  Your next OV is not until May, not sure if you would like to work him your schedule for this referral.  Please advise.   Thank you

## 2020-12-18 ENCOUNTER — Encounter: Payer: Self-pay | Admitting: Gastroenterology

## 2020-12-18 DIAGNOSIS — H9190 Unspecified hearing loss, unspecified ear: Secondary | ICD-10-CM | POA: Insufficient documentation

## 2020-12-18 DIAGNOSIS — H905 Unspecified sensorineural hearing loss: Secondary | ICD-10-CM | POA: Diagnosis not present

## 2020-12-18 NOTE — Telephone Encounter (Signed)
Please schedule this patient in overbook clinic visit at [id number]/250/350.  I am willing to move forward with an increased risk upper and lower endoscopy while on anticoagulation but need to talk with the patient about it briefly thanks.  Let me know and referring when he is scheduled for clinic visit. GM

## 2020-12-19 NOTE — Telephone Encounter (Signed)
Thank you for update. GM 

## 2020-12-19 NOTE — Telephone Encounter (Signed)
Patient is currently on schedule  For 12/21/20 at 9:30am.

## 2020-12-21 ENCOUNTER — Encounter: Payer: Self-pay | Admitting: Gastroenterology

## 2020-12-21 ENCOUNTER — Ambulatory Visit: Payer: BC Managed Care – PPO | Admitting: Gastroenterology

## 2020-12-21 ENCOUNTER — Telehealth: Payer: Self-pay

## 2020-12-21 ENCOUNTER — Other Ambulatory Visit: Payer: Self-pay

## 2020-12-21 VITALS — BP 130/76 | HR 96 | Ht 69.5 in | Wt 218.0 lb

## 2020-12-21 DIAGNOSIS — R9389 Abnormal findings on diagnostic imaging of other specified body structures: Secondary | ICD-10-CM | POA: Diagnosis not present

## 2020-12-21 DIAGNOSIS — Z1211 Encounter for screening for malignant neoplasm of colon: Secondary | ICD-10-CM | POA: Diagnosis not present

## 2020-12-21 DIAGNOSIS — K219 Gastro-esophageal reflux disease without esophagitis: Secondary | ICD-10-CM | POA: Diagnosis not present

## 2020-12-21 DIAGNOSIS — Z8719 Personal history of other diseases of the digestive system: Secondary | ICD-10-CM | POA: Diagnosis not present

## 2020-12-21 MED ORDER — PLENVU 140 G PO SOLR
1.0000 | ORAL | 0 refills | Status: DC
Start: 2020-12-21 — End: 2021-03-05

## 2020-12-21 NOTE — Telephone Encounter (Signed)
[facility] Pre-operative Risk Assessment     Request for surgical clearance:     Endoscopy Procedure  What type of surgery is being performed?    Colonoscopy  When is this surgery scheduled?     03/05/2021  What type of clearance is required ?   Medication   Are there any medications that need to be held prior to surgery and how long? Xarelto x2 days prior to procedure   Practice name and name of physician performing surgery?      [facility]-Dr Mansouraty   What is your office phone and fax number?      Phone- [phone]  [phone]  Anesthesia type (None, local, MAC, general) ?       MAC

## 2020-12-21 NOTE — Patient Instructions (Addendum)
If you are age 50 or younger, your body mass index should be between 19-25. Your Body mass index is 31.73 kg/m. If this is out of the aformentioned range listed, please consider follow up with your Primary Care Provider.   You have been scheduled for a colonoscopy. Please follow written instructions given to you at your visit today.  Please pick up your prep supplies at the pharmacy within the next 1-3 days. If you use inhalers (even only as needed), please bring them with you on the day of your procedure.   Due to recent changes in healthcare laws, you may see the results of your imaging and laboratory studies on MyChart before your provider has had a chance to review them.  We understand that in some cases there may be results that are confusing or concerning to you. Not all laboratory results come back in the same time frame and the provider may be waiting for multiple results in order to interpret others.  Please give [place] 48 hours in order for your provider to thoroughly review all the results before contacting the office for clarification of your results.   We have sent the following medications to your pharmacy for you to pick up at your convenience: Plenvu ( please use co-pay assistance card/form given to you today at your visit .)   You will be contaced by our office prior to your procedure for directions on holding your Xarelto.  If you do not hear from our office 1 week prior to your scheduled procedure, please call [phone] to discuss.    Thank you for choosing me and [facility].  Dr. Rush Landmark

## 2020-12-21 NOTE — Progress Notes (Signed)
[place] VISIT   Primary Care Provider Shirline Frees, MD [address] [place] [address] [phone]  Referring Provider [name], MD [place],  [place] [address] [phone]  Patient Profile: Steven [name] is a 50 y.o. male with a pmh significant for unprovoked recurrent PE (on anticoagulation), OSA, obesity, hyperlipidemia, hypertension, diabetes, infrequent GERD.  The patient presents to the [facility] for an evaluation and management of problem(s) noted below:  Problem List 1. Colon cancer screening   2. Abnormal finding on CT scan   3. History of rectal bleeding   4. Gastroesophageal reflux disease, unspecified whether esophagitis present     History of Present Illness This is the patient's first visit to the outpatient [place] clinic.  The patient is referred by his hematologist for further evaluation after recent imaging performed showed potential abnormality within the rectum.  The patient has a history of prior unprovoked PE which subsequently recurred last year when he was diagnosed with this in October.  He has remained on anticoagulation evaluation from a hypercoagulable standpoint has been pursued.  To evaluate ensure there was no evidence of any abnormality or malignancy patient underwent cross-sectional imaging.  The CT suggested some rectal thickening.  Patient was dealing with a "cellulitis of years of buttock" during October of last year.  He states that he moves his bowel movements once to twice daily.  For the most part his bowel movements are formed but there will be periods of having some loose bowel movements.  He does not deal with constipation.  He does not describe any changes in his bowel habits for years.  He has noted bright red blood per rectum in the past but had felt that it was likely hemorrhoidal in nature and it has not occurred in months.  He denies fecal urgency or  incomplete evacuation.  The patient has very infrequent acid reflux symptoms.  This mostly occurs due to dietary indiscretions but he does not take any medications for this on a regular basis.  Denies any abdominal pain.  He has never had an upper or lower endoscopy.  He has never been screened for colon cancer.  GI Review of Systems Positive as above Negative for dysphagia, odynophagia, nausea, vomiting, pain, early satiety, bloating, melena  Review of Systems General: Denies fevers/chills/weight loss unintentionally HEENT: Denies oral lesions Cardiovascular: Denies chest pain/palpitations Pulmonary: Denies shortness of breath  Gastroenterological: See HPI Genitourinary: Denies darkened urine or hematuria Hematological: Denies easy bruising/bleeding Endocrine: Denies temperature intolerance Dermatological: Denies jaundice Psychological: Mood is stable   Medications Current Outpatient Medications  Medication Sig Dispense Refill  . acetaminophen (TYLENOL) 500 MG tablet Take 1,000 mg by mouth every 6 (six) hours as needed for mild pain.    Marland Kitchen albuterol (VENTOLIN HFA) 108 (90 Base) MCG/ACT inhaler Inhale 2 puffs into the lungs every 4 (four) hours as needed for wheezing.    Marland Kitchen ALPRAZolam (XANAX) 0.25 MG tablet Take 0.25 mg by mouth daily as needed for anxiety or sleep.    . blood glucose meter kit and supplies KIT Dispense based on patient and insurance preference. Use up to four times daily as directed. (FOR ICD-9 250.00, 250.01). 1 each 0  . fenofibrate 160 MG tablet Take 160 mg by mouth daily.    . irbesartan (AVAPRO) 150 MG tablet Take 150 mg by mouth daily.    . meloxicam (MOBIC) 15 MG tablet Take 15 mg by mouth at bedtime.     Marland Kitchen  PEG-KCl-NaCl-NaSulf-Na Asc-C (PLENVU) 140 g SOLR Take 1 kit by mouth as directed. Use coupon: BIN: [id number] PNC: CNRX Group: [id number] ID: [id number] 1 each 0  . tetrahydrozoline-zinc (VISINE-AC) 0.05-0.25 % ophthalmic solution Place 2 drops into both eyes as  needed (allergy).    Alveda Reasons 20 MG TABS tablet Take 20 mg by mouth daily.    Marland Kitchen XIGDUO XR 01-999 MG TB24 Take 2 tablets by mouth daily.     No current facility-administered medications for this visit.    Allergies Allergies  Allergen Reactions  . Contrast Media [Iodinated Diagnostic Agents] Hives    As a 50 year old experienced hives and nausea    Histories Past Medical History:  Diagnosis Date  . Anxiety   . Asthma   . Depression   . Diabetes mellitus without complication ([place])   . HLD (hyperlipidemia)   . Hypertension   . Kidney stones   . OSA (obstructive sleep apnea)    CPAP   Past Surgical History:  Procedure Laterality Date  . APPENDECTOMY    . CYST EXCISION     left neck  . WISDOM TOOTH EXTRACTION     Social History   Socioeconomic History  . Marital status: Married    Spouse name: Not on file  . Number of children: 3  . Years of education: Not on file  . Highest education level: Not on file  Occupational History  . Occupation: Gaffer  Tobacco Use  . Smoking status: Never Smoker  . Smokeless tobacco: Never Used  Vaping Use  . Vaping Use: Never used  Substance and Sexual Activity  . Alcohol use: Yes    Comment: 1 beer every 3-4 months  . Drug use: Never  . Sexual activity: Not on file  Other Topics Concern  . Not on file  Social History Narrative  . Not on file   Social Determinants of Health   Financial Resource Strain: Not on file  Food Insecurity: Not on file  Transportation Needs: Not on file  Physical Activity: Not on file  Stress: Not on file  Social Connections: Not on file  Intimate Partner Violence: Not on file   Family History  Problem Relation Age of Onset  . Diabetes Mellitus II Mother   . Breast cancer Mother 70  . Thrombophlebitis Father   . Breast cancer Maternal Grandmother 72  . Diabetes Maternal Grandmother   . Breast cancer Maternal Aunt 60  . Diabetes Maternal Aunt   . Hypertension Paternal  Grandmother   . Colon cancer Neg Hx   . Esophageal cancer Neg Hx   . Inflammatory bowel disease Neg Hx   . Liver disease Neg Hx   . Pancreatic cancer Neg Hx   . Rectal cancer Neg Hx   . Stomach cancer Neg Hx    I have reviewed his medical, social, and family history in detail and updated the electronic medical record as necessary.    PHYSICAL EXAMINATION  BP 130/76 (BP Location: Left Arm, Patient Position: Sitting, Cuff Size: Normal)   Pulse 96   Ht 5' 9.5" (1.765 m) Comment: height measured without shoes  Wt 218 lb (98.9 kg)   BMI 31.73 kg/m  Wt Readings from Last 3 Encounters:  12/21/20 218 lb (98.9 kg)  10/17/20 218 lb 9.6 oz (99.2 kg)  07/08/20 222 lb 6.4 oz (100.9 kg)  GEN: NAD, appears stated age, doesn't appear chronically ill PSYCH: Cooperative, without pressured speech EYE: Conjunctivae pink, sclerae anicteric ENT: Masked  CV: RR without R/Gs  RESP: CTAB posteriorly, without wheezing GI: NABS, soft, rounded, NT/ND, without rebound or guarding, no HSM appreciated GU: DRE and perianal exam was offered to patient today but as he has agreed to move forward with a colonoscopy in the coming weeks, he has deferred on this MSK/EXT: No lower extremity edema SKIN: No jaundice NEURO:  Alert & Oriented x 3, no focal deficits   REVIEW OF DATA  I reviewed the following data at the time of this encounter:  GI Procedures and Studies  No relevant studies  Laboratory Studies  Reviewed those in epic  Imaging Studies  2/22 CTAP IMPRESSION: 1. 2 mm nonobstructing stone at the upper pole of the right kidney. 2. No other definite nephrolithiasis. 3. Asymmetric soft tissue along the left side of the rectum is nonobstructive. Direct visualization and exam is recommended to exclude neoplasm. 4. Hepatic steatosis. 5. No other acute or focal lesion to explain the patient's symptoms.   ASSESSMENT  Mr. Defalco is a 50 y.o. male with a pmh significant for unprovoked recurrent PE (on  anticoagulation), OSA, obesity, hyperlipidemia, hypertension, diabetes, infrequent GERD.  The patient is seen today for evaluation and management of:  1. Colon cancer screening   2. Abnormal finding on CT scan   3. History of rectal bleeding   4. Gastroesophageal reflux disease, unspecified whether esophagitis present    The patient is hemodynamically and clinically stable at this time.  Based on the patient's recent cross-sectional imaging findings direct endoscopy via colonoscopy is recommended.  Patient agrees to move forward with this.  Colon cancer screening via colonoscopy will be pursued at this time as well.  I offered the patient a DRE and perianal exam today to ensure that the finding was not more concerning requiring a more emergent procedure, but this was deferred by the patient as he is agreed to move forward with colonoscopy soon.  We will reach out to the patient's hematologist to see if they are okay with moving forward with hold of anticoagulation within the next few weeks (will be 6 months since his PE diagnosis in the beginning of April).  GERD symptoms are very infrequent at this time and as patient does not have evidence of anemia we will not need to pursue an upper endoscopy unless other symptoms develop down the road.  The risks and benefits of endoscopic evaluation were discussed with the patient; these include but are not limited to the risk of perforation, infection, bleeding, missed lesions, lack of diagnosis, severe illness requiring hospitalization, as well as anesthesia and sedation related illnesses.  The patient is agreeable to proceed.  All patient questions were answered to the best of my ability, and the patient agrees to the aforementioned plan of action with follow-up as indicated.   PLAN  Proceed with scheduling colonoscopy for screening/diagnostic purposes next available for patient Obtain approval from hematology for holding of blood thinner for 1-2 days prior to  colonoscopy May use fiber supplementation (Metamucil or FiberCon) daily If issues develop or change while waiting for colonoscopy please alert [place] and a more urgent colonoscopy can be pursued   Orders Placed This Encounter  Procedures  . Ambulatory referral to Gastroenterology    New Prescriptions   PEG-KCL-NACL-NASULF-NA ASC-C (PLENVU) 140 G SOLR    Take 1 kit by mouth as directed. Use coupon: BIN: [id number] [facility]: CNRX Group: [id number] ID: [id number]   Modified Medications   No medications on file    Planned Follow Up  No follow-ups on file.   Total Time in Face-to-Face and in Coordination of Care for patient including independent/personal interpretation/review of prior testing, medical history, examination, medication adjustment, communicating results with the patient directly, and documentation with the EHR is 35 minutes.   Justice Britain, MD [facility] Office # [phone]

## 2020-12-25 ENCOUNTER — Encounter: Payer: Self-pay | Admitting: Hematology and Oncology

## 2020-12-26 ENCOUNTER — Encounter: Payer: Self-pay | Admitting: Hematology and Oncology

## 2020-12-26 ENCOUNTER — Other Ambulatory Visit: Payer: Self-pay | Admitting: Hematology and Oncology

## 2020-12-26 DIAGNOSIS — Z8719 Personal history of other diseases of the digestive system: Secondary | ICD-10-CM | POA: Insufficient documentation

## 2020-12-26 DIAGNOSIS — R9389 Abnormal findings on diagnostic imaging of other specified body structures: Secondary | ICD-10-CM | POA: Insufficient documentation

## 2020-12-26 DIAGNOSIS — Z1211 Encounter for screening for malignant neoplasm of colon: Secondary | ICD-10-CM | POA: Insufficient documentation

## 2020-12-26 DIAGNOSIS — K219 Gastro-esophageal reflux disease without esophagitis: Secondary | ICD-10-CM | POA: Insufficient documentation

## 2020-12-26 NOTE — Telephone Encounter (Signed)
Okay to hold Xarelto x2 days per Dr.Iruku . Pt has been informed. Voiced understanding.

## 2020-12-26 NOTE — Progress Notes (Signed)
I called the patient. He says he had DRVVT while he was off of Eliquis and he can get those results. If this lab was indeed normal, then we can consider just following up with him on xarelto. If this lab was abnormal while he is off of Eliquis, then he could have true LA and there is concern for APLS I have discussed with him that xarelto is inferior in APLS and coumadin is our preferred anticoagulant. He will get these results for [place] and then we will proceed accordingly I offered him to get on lovenox for couple weeks and repeat labs. He doesn't want to take injections Also I discussed that he will be off of anticoagulation for a couple days while he will be doing colonoscopy There is small risk of clotting while he is off blood thinners, he expressed understanding.  Steven [name]

## 2021-01-10 ENCOUNTER — Ambulatory Visit: Payer: BC Managed Care – PPO | Admitting: Hematology and Oncology

## 2021-01-14 ENCOUNTER — Encounter: Payer: Self-pay | Admitting: Hematology and Oncology

## 2021-01-21 DIAGNOSIS — H903 Sensorineural hearing loss, bilateral: Secondary | ICD-10-CM | POA: Diagnosis present

## 2021-02-06 DIAGNOSIS — I10 Essential (primary) hypertension: Secondary | ICD-10-CM | POA: Diagnosis not present

## 2021-02-06 DIAGNOSIS — E1165 Type 2 diabetes mellitus with hyperglycemia: Secondary | ICD-10-CM | POA: Diagnosis not present

## 2021-02-06 DIAGNOSIS — E7800 Pure hypercholesterolemia, unspecified: Secondary | ICD-10-CM | POA: Diagnosis not present

## 2021-02-06 DIAGNOSIS — Z86711 Personal history of pulmonary embolism: Secondary | ICD-10-CM | POA: Diagnosis not present

## 2021-02-27 DIAGNOSIS — D649 Anemia, unspecified: Secondary | ICD-10-CM

## 2021-02-27 DIAGNOSIS — C801 Malignant (primary) neoplasm, unspecified: Secondary | ICD-10-CM

## 2021-02-27 HISTORY — DX: Malignant (primary) neoplasm, unspecified: C80.1

## 2021-02-27 HISTORY — DX: Anemia, unspecified: D64.9

## 2021-02-28 ENCOUNTER — Telehealth: Payer: Self-pay | Admitting: Gastroenterology

## 2021-02-28 NOTE — Telephone Encounter (Signed)
Inbound call from patient. Have questions about his sedation for the procedure, if he will be intubated? States he needs an idea of what to expect. Best contact number [phone], states can leave the information on vm if there is no answer.

## 2021-02-28 NOTE — Telephone Encounter (Signed)
Returned call to patient. Answered all questions.

## 2021-03-04 ENCOUNTER — Telehealth: Payer: Self-pay | Admitting: Gastroenterology

## 2021-03-04 NOTE — Telephone Encounter (Signed)
Patient called with questions regarding his prep instructions.  Patient states that he is scheduled for 800am procedure, but his instructions indicate he is scheduled for 900am procedure.  I verified with patient that he is scheduled for 800am time slot and instructions for prep times were discussed by phone.    Patient states he taking Xanax as needed for anxiety and wanted to verify he could take Xanax if needed on the morning of procedure.  I verified with Patty, RN that it is OK to take Xanax if needed on the morning of the procedure as long as it is taken before 5am which is 3 hours prior to procedure.  Patient agreed to plan and verbalized understanding. No further questions.

## 2021-03-04 NOTE — Telephone Encounter (Signed)
Inbound from pt stating he had questions regarding procedure and medications. Please give him a call. Thank you

## 2021-03-05 ENCOUNTER — Ambulatory Visit (AMBULATORY_SURGERY_CENTER): Payer: BC Managed Care – PPO | Admitting: Gastroenterology

## 2021-03-05 ENCOUNTER — Other Ambulatory Visit: Payer: Self-pay

## 2021-03-05 ENCOUNTER — Other Ambulatory Visit (INDEPENDENT_AMBULATORY_CARE_PROVIDER_SITE_OTHER): Payer: BC Managed Care – PPO

## 2021-03-05 ENCOUNTER — Encounter: Payer: Self-pay | Admitting: Gastroenterology

## 2021-03-05 VITALS — BP 118/58 | HR 81 | Temp 97.1°F | Resp 14 | Ht 69.0 in | Wt 218.0 lb

## 2021-03-05 DIAGNOSIS — K5730 Diverticulosis of large intestine without perforation or abscess without bleeding: Secondary | ICD-10-CM

## 2021-03-05 DIAGNOSIS — K641 Second degree hemorrhoids: Secondary | ICD-10-CM

## 2021-03-05 DIAGNOSIS — Z1211 Encounter for screening for malignant neoplasm of colon: Secondary | ICD-10-CM | POA: Diagnosis not present

## 2021-03-05 DIAGNOSIS — C20 Malignant neoplasm of rectum: Secondary | ICD-10-CM

## 2021-03-05 DIAGNOSIS — K6389 Other specified diseases of intestine: Secondary | ICD-10-CM

## 2021-03-05 DIAGNOSIS — R9389 Abnormal findings on diagnostic imaging of other specified body structures: Secondary | ICD-10-CM

## 2021-03-05 DIAGNOSIS — Z8719 Personal history of other diseases of the digestive system: Secondary | ICD-10-CM

## 2021-03-05 DIAGNOSIS — K625 Hemorrhage of anus and rectum: Secondary | ICD-10-CM | POA: Diagnosis not present

## 2021-03-05 HISTORY — PX: COLONOSCOPY: SHX174

## 2021-03-05 LAB — CBC WITH DIFFERENTIAL/PLATELET
Basophils Absolute: 0.1 10*3/uL (ref 0.0–0.1)
Basophils Relative: 1 % (ref 0.0–3.0)
Eosinophils Absolute: 0.4 10*3/uL (ref 0.0–0.7)
Eosinophils Relative: 4.9 % (ref 0.0–5.0)
HCT: 42.2 % (ref 39.0–52.0)
Hemoglobin: 14.1 g/dL (ref 13.0–17.0)
Lymphocytes Relative: 30.8 % (ref 12.0–46.0)
Lymphs Abs: 2.7 10*3/uL (ref 0.7–4.0)
MCHC: 33.5 g/dL (ref 30.0–36.0)
MCV: 74.5 fl — ABNORMAL LOW (ref 78.0–100.0)
Monocytes Absolute: 0.4 10*3/uL (ref 0.1–1.0)
Monocytes Relative: 4.5 % (ref 3.0–12.0)
Neutro Abs: 5.2 10*3/uL (ref 1.4–7.7)
Neutrophils Relative %: 58.8 % (ref 43.0–77.0)
Platelets: 194 10*3/uL (ref 150.0–400.0)
RBC: 5.66 Mil/uL (ref 4.22–5.81)
RDW: 14.6 % (ref 11.5–15.5)
WBC: 8.8 10*3/uL (ref 4.0–10.5)

## 2021-03-05 LAB — IBC PANEL
Iron: 8 ug/dL — ABNORMAL LOW (ref 42–165)
Saturation Ratios: 1.4 % — ABNORMAL LOW (ref 20.0–50.0)
Transferrin: 399 mg/dL — ABNORMAL HIGH (ref 212.0–360.0)

## 2021-03-05 LAB — COMPREHENSIVE METABOLIC PANEL
ALT: 21 U/L (ref 0–53)
AST: 24 U/L (ref 0–37)
Albumin: 4.2 g/dL (ref 3.5–5.2)
Alkaline Phosphatase: 48 U/L (ref 39–117)
BUN: 14 mg/dL (ref 6–23)
CO2: 17 mEq/L — ABNORMAL LOW (ref 19–32)
Calcium: 9 mg/dL (ref 8.4–10.5)
Chloride: 107 mEq/L (ref 96–112)
Creatinine, Ser: 0.95 mg/dL (ref 0.40–1.50)
GFR: 93.99 mL/min (ref >60.00)
Glucose, Bld: 161 mg/dL — ABNORMAL HIGH (ref 70–99)
Potassium: 4 mEq/L (ref 3.5–5.1)
Sodium: 141 mEq/L (ref 135–145)
Total Bilirubin: 0.8 mg/dL (ref 0.2–1.2)
Total Protein: 6.8 g/dL (ref 6.0–8.3)

## 2021-03-05 LAB — FERRITIN: Ferritin: 16.7 ng/mL — ABNORMAL LOW (ref 22.0–322.0)

## 2021-03-05 LAB — IRON: Iron: 8 ug/dL — ABNORMAL LOW (ref 42–165)

## 2021-03-05 MED ORDER — SODIUM CHLORIDE 0.9 % IV SOLN
500.0000 mL | Freq: Once | INTRAVENOUS | Status: DC
Start: 2021-03-05 — End: 2021-03-05

## 2021-03-05 NOTE — Progress Notes (Signed)
Called to room to assist during endoscopic procedure.  Patient ID and intended procedure confirmed with present staff. Received instructions for my participation in the procedure from the performing physician.  

## 2021-03-05 NOTE — Progress Notes (Signed)
J.K. vital signs.

## 2021-03-05 NOTE — Progress Notes (Signed)
Verbal order received in recovery for pt. To start Xarelto on Thursday unless doctor mansouraty notifies him otherwise.pt. and wife verbalized understanding.

## 2021-03-05 NOTE — Op Note (Addendum)
[facility] Patient Name: Steven [name] Procedure Date: 03/05/2021 7:54 AM MRN: [id number] Endoscopist: Gabriel Mansouraty , MD Age: 49 Referring MD:  Date of Birth: 10/31/1970 Gender: Male Account #: [id number] Procedure:                Colonoscopy Indications:              Screening for colorectal malignant neoplasm, This                            is the patient's first colonoscopy, Incidental -                            Rectal bleeding, Incidental - Abnormal CT of the GI                            tract with concern of abnormality in rectum Medicines:                Monitored Anesthesia Care Procedure:                Pre-Anesthesia Assessment:                           - Prior to the procedure, a History and Physical                            was performed, and patient medications and                            allergies were reviewed. The patient's tolerance of                            previous anesthesia was also reviewed. The risks                            and benefits of the procedure and the sedation                            options and risks were discussed with the patient.                            All questions were answered, and informed consent                            was obtained. Prior Anticoagulants: The patient has                            taken no previous anticoagulant or antiplatelet                            agents. ASA Grade Assessment: III - A patient with                            severe systemic disease. After reviewing the risks                              and benefits, the patient was deemed in                            satisfactory condition to undergo the procedure.                           After obtaining informed consent, the colonoscope                            was passed under direct vision. Throughout the                            procedure, the patient's blood pressure, pulse, and                            oxygen  saturations were monitored continuously. The                            Olympus CF-HQ190 (#[id number]) Colonoscope was                            introduced through the anus and advanced to the 5                            cm into the ileum. The colonoscopy was performed                            without difficulty. The patient tolerated the                            procedure. The quality of the bowel preparation was                            adequate. The terminal ileum, ileocecal valve,                            appendiceal orifice, and rectum were photographed. Scope In: 8:08:50 AM Scope Out: 8:27:47 AM Scope Withdrawal Time: 0 hours 15 minutes 31 seconds  Total Procedure Duration: 0 hours 18 minutes 57 seconds  Findings:                 The digital rectal exam findings include palpable                            rectal mass/lesion.                           The terminal ileum and ileocecal valve appeared                            normal.                           A frond-like/villous, fungating, infiltrative and  ulcerated non-obstructing large mass was found in                            the rectum (beginnin at 8 cm and up to 13 cm). The                            mass was partially circumferential (involving                            one-third of the lumen circumference). The mass                            measured five cm in length. Oozing was present.                            Biopsies were taken with a cold forceps for                            histology.                           Multiple small-mouthed diverticula were found in                            the recto-sigmoid colon, sigmoid colon and                            descending colon.                           Normal mucosa was found in the entire colon                            otherwise.                           Non-bleeding non-thrombosed internal hemorrhoids                             were found during retroflexion, during perianal                            exam and during digital exam. The hemorrhoids were                            Grade II (internal hemorrhoids that prolapse but                            reduce spontaneously). Complications:            No immediate complications. Estimated Blood Loss:     Estimated blood loss was minimal. Impression:               - Palpable rectal mass found on digital rectal exam.                           -  The examined portion of the ileum was normal.                           - Likely malignant tumor in the rectum. Biopsied.                           - Diverticulosis in the recto-sigmoid colon, in the                            sigmoid colon and in the descending colon.                           - Normal mucosa in the entire examined colon.                           - Non-bleeding non-thrombosed internal hemorrhoids. Recommendation:           - The patient will be observed post-procedure,                            until all discharge criteria are met.                           - Discharge patient to home.                           - Patient has a contact number available for                            emergencies. The signs and symptoms of potential                            delayed complications were discussed with the                            patient. Return to normal activities tomorrow.                            Written discharge instructions were provided to the                            patient.                           - High fiber diet.                           - Use FiberCon 1-2 tablets PO daily.                           - Restart Xarelto in 48 hours (6/9) unless                            significant anemia is found to have developed -                              will also discuss with Hematology/Oncology).                           - Await pathology results.                           - CT-CAP may be  recommended for re-staging as well                            (had recent scans earlier this year in setting of                            PE) but also needs a Pelvic MRI for likely                            underlying malignancy of the rectum.                           - Urgent referral to Colorectal surgery to be                            placed, unless anything is found on the imaging                            studies to suggest need of Oncology evaluation.                           - Obtain CEA and CMP and CBC and Iron/TIBC/Ferritin                            today.                           - Will forward results to Hematology/Oncology.                           - Repeat colonoscopy in 1 year for surveillance.                           - The findings and recommendations were discussed                            with the patient.                           - The findings and recommendations were discussed                            with the patient's family. Gabriel Mansouraty, MD 03/05/2021 8:35:39 AM 

## 2021-03-05 NOTE — Patient Instructions (Signed)
YOU HAD AN ENDOSCOPIC PROCEDURE TODAY AT [facility]:   Refer to the procedure report that was given to you for any specific questions about what was found during the examination.  If the procedure report does not answer your questions, please call your gastroenterologist to clarify.  If you requested that your care partner not be given the details of your procedure findings, then the procedure report has been included in a sealed envelope for you to review at your convenience later.  YOU SHOULD EXPECT: Some feelings of bloating in the abdomen. Passage of more gas than usual.  Walking can help get rid of the air that was put into your GI tract during the procedure and reduce the bloating. If you had a lower endoscopy (such as a colonoscopy or flexible sigmoidoscopy) you may notice spotting of blood in your stool or on the toilet paper. If you underwent a bowel prep for your procedure, you may not have a normal bowel movement for a few days.  Please Note:  You might notice some irritation and congestion in your nose or some drainage.  This is from the oxygen used during your procedure.  There is no need for concern and it should clear up in a day or so.  SYMPTOMS TO REPORT IMMEDIATELY:   Following lower endoscopy (colonoscopy or flexible sigmoidoscopy):  Excessive amounts of blood in the stool  Significant tenderness or worsening of abdominal pains  Swelling of the abdomen that is new, acute  Fever of 100F or higher   For urgent or emergent issues, a gastroenterologist can be reached at any hour by calling [phone]. Do not use MyChart messaging for urgent concerns.    DIET:  We do recommend a small meal at first, but then you may proceed to your regular diet.  Drink plenty of fluids but you should avoid alcoholic beverages for 24 hours.  ACTIVITY:  You should plan to take it easy for the rest of today and you should NOT DRIVE or use heavy machinery until tomorrow (because  of the sedation medicines used during the test).    FOLLOW UP: Our staff will call the number listed on your records 48-72 hours following your procedure to check on you and address any questions or concerns that you may have regarding the information given to you following your procedure. If we do not reach you, we will leave a message.  We will attempt to reach you two times.  During this call, we will ask if you have developed any symptoms of COVID 19. If you develop any symptoms (ie: fever, flu-like symptoms, shortness of breath, cough etc.) before then, please call [phone].  If you test positive for Covid 19 in the 2 weeks post procedure, please call and report this information to [place].    If any biopsies were taken you will be contacted by phone or by letter within the next 1-3 weeks.  Please call us at [phone] if you have not heard about the biopsies in 3 weeks.    SIGNATURES/CONFIDENTIALITY: You and/or your care partner have signed paperwork which will be entered into your electronic medical record.  These signatures attest to the fact that that the information above on your After Visit Summary has been reviewed and is understood.  Full responsibility of the confidentiality of this discharge information lies with you and/or your care-partner.   Use FIBERCON 1-2 TABLETS DAILY,HIGH FIBER DIET. CEA AND CMP AND CBC AND IRON/TIBC/FERRITIN TODAY. REPEAT  COLONOSCOPY IN 1 YEAR. OFFICE WILL CALL WITH CT-CAP FOR STAGING AND PELVIC MRI, COLORECTAL REFERRAL.START  XARELTO  ON Thursday UNLESS DR. MANSOURATY LET'S YOU NO OTHERWISE.,CONTRAST SENT HOME WITH PATIENT.

## 2021-03-05 NOTE — Progress Notes (Signed)
Pt's states no medical or surgical changes since previsit or office visit. 

## 2021-03-05 NOTE — Progress Notes (Signed)
A/ox3, pleased with MAC, report to RN 

## 2021-03-06 ENCOUNTER — Other Ambulatory Visit: Payer: Self-pay | Admitting: Hematology and Oncology

## 2021-03-06 ENCOUNTER — Other Ambulatory Visit: Payer: Self-pay

## 2021-03-06 ENCOUNTER — Encounter: Payer: Self-pay | Admitting: Gastroenterology

## 2021-03-06 LAB — CEA: CEA: 6.9 ng/mL — ABNORMAL HIGH

## 2021-03-07 ENCOUNTER — Other Ambulatory Visit: Payer: Self-pay

## 2021-03-07 ENCOUNTER — Telehealth: Payer: Self-pay | Admitting: *Deleted

## 2021-03-07 ENCOUNTER — Other Ambulatory Visit: Payer: Self-pay | Admitting: Hematology and Oncology

## 2021-03-07 ENCOUNTER — Telehealth: Payer: Self-pay | Admitting: Gastroenterology

## 2021-03-07 DIAGNOSIS — C19 Malignant neoplasm of rectosigmoid junction: Secondary | ICD-10-CM

## 2021-03-07 DIAGNOSIS — C20 Malignant neoplasm of rectum: Secondary | ICD-10-CM

## 2021-03-07 DIAGNOSIS — Z91041 Radiographic dye allergy status: Secondary | ICD-10-CM

## 2021-03-07 MED ORDER — PREDNISONE 50 MG PO TABS
ORAL_TABLET | ORAL | 0 refills | Status: DC
Start: 2021-03-07 — End: 2021-07-21

## 2021-03-07 NOTE — Telephone Encounter (Signed)
The pt has been advised and will restart xarelto and begin iron daily.

## 2021-03-07 NOTE — Progress Notes (Signed)
Sent in Prednisone to patient's pharmacy per Dr. Chryl Heck.

## 2021-03-07 NOTE — Progress Notes (Signed)
I spoke with patient regarding his newly diagnosed rectal cancer.  An already established patient with Dr. Chryl Heck.  He is aware of his MR pelvis scheduled for 6/17.  I had scheduled CT CAP for tomorrow 6/10 however patient states he is out of town.  He has the number for [employer] and will call to change to one day next week.  I explained we will discuss him at Tumor Board on 03/20/2021 and explained what this is.  I answered all of his questions currently and he was given my direct number to call back if needed for questions or concerns. I have requested  MSI testing on Accession [id number] from [facility] fax [phone]

## 2021-03-07 NOTE — Telephone Encounter (Signed)
Patient calling wants to discuss scheduled MRI/further concerns/questions.. Plz advise   Thanks

## 2021-03-07 NOTE — Telephone Encounter (Signed)
Dr Rush Landmark the pt is calling to confirm if he should resume xarelto tonight. He says he was told that you would contact him after labs were back.  He has not heard anything and would like to know how to proceed. Please advise

## 2021-03-07 NOTE — Progress Notes (Signed)
Called patient Discussed about systemic imaging and discussion in GI TB. MSI testing will be sent. Most likely he will need CRT followed by surgery If he is indeed mis match repair deficient, I will discuss with the team about any role of immunotherapy before CRT based on recently published very promising trial. PDL1 blockade in MMR deficient, locally advanced rectal cancer.  Thedore Pickel

## 2021-03-07 NOTE — Telephone Encounter (Signed)
  Follow up Call-  Call back number 03/05/2021  Post procedure Call Back phone  # [phone]  Permission to leave phone message Yes  Some recent data might be hidden     Patient questions:  Do you have a fever, pain , or abdominal swelling? No. Pain Score  0 *  Have you tolerated food without any problems? Yes.    Have you been able to return to your normal activities? Yes.    Do you have any questions about your discharge instructions: Diet   No. Medications  No. Follow up visit  No.  Do you have questions or concerns about your Care? No.  Actions: * If pain score is 4 or above: No action needed, pain <4.  Have you developed a fever since your procedure? no  2.   Have you had an respiratory symptoms (SOB or cough) since your procedure? no  3.   Have you tested positive for COVID 19 since your procedure no  4.   Have you had any family members/close contacts diagnosed with the COVID 19 since your procedure?  no   If yes to any of these questions please route to Joylene John, RN and Joella Prince, RN

## 2021-03-07 NOTE — Telephone Encounter (Signed)
OK to restart blood thinner as no anemia. Was to start Oral Iron for Iron deficiency. Thanks. GM

## 2021-03-08 ENCOUNTER — Ambulatory Visit (HOSPITAL_COMMUNITY): Payer: BC Managed Care – PPO

## 2021-03-08 ENCOUNTER — Other Ambulatory Visit: Payer: Self-pay

## 2021-03-08 DIAGNOSIS — Z91041 Radiographic dye allergy status: Secondary | ICD-10-CM

## 2021-03-08 DIAGNOSIS — C20 Malignant neoplasm of rectum: Secondary | ICD-10-CM

## 2021-03-08 NOTE — Progress Notes (Signed)
Placed referral to Radiation Oncology

## 2021-03-14 ENCOUNTER — Other Ambulatory Visit: Payer: Self-pay

## 2021-03-14 ENCOUNTER — Ambulatory Visit (HOSPITAL_COMMUNITY)
Admission: RE | Admit: 2021-03-14 | Discharge: 2021-03-14 | Disposition: A | Discharge status: Home / Self Care | Payer: BC Managed Care – PPO | Source: Ambulatory Visit | Attending: Hematology and Oncology | Admitting: Hematology and Oncology

## 2021-03-14 DIAGNOSIS — R911 Solitary pulmonary nodule: Secondary | ICD-10-CM | POA: Diagnosis not present

## 2021-03-14 DIAGNOSIS — N200 Calculus of kidney: Secondary | ICD-10-CM | POA: Diagnosis not present

## 2021-03-14 DIAGNOSIS — C19 Malignant neoplasm of rectosigmoid junction: Secondary | ICD-10-CM | POA: Diagnosis not present

## 2021-03-14 DIAGNOSIS — C189 Malignant neoplasm of colon, unspecified: Secondary | ICD-10-CM | POA: Diagnosis not present

## 2021-03-14 DIAGNOSIS — D490 Neoplasm of unspecified behavior of digestive system: Secondary | ICD-10-CM | POA: Diagnosis not present

## 2021-03-14 DIAGNOSIS — C20 Malignant neoplasm of rectum: Secondary | ICD-10-CM | POA: Diagnosis not present

## 2021-03-14 MED ORDER — SODIUM CHLORIDE (PF) 0.9 % IJ SOLN
INTRAMUSCULAR | Status: AC
  Filled 2021-03-14: qty 50

## 2021-03-14 MED ORDER — IOHEXOL 300 MG/ML  SOLN
100.0000 mL | Freq: Once | INTRAMUSCULAR | Status: AC | PRN
  Administered 2021-03-14: 16:00:00 100 mL via INTRAVENOUS

## 2021-03-15 ENCOUNTER — Telehealth: Payer: Self-pay

## 2021-03-15 ENCOUNTER — Ambulatory Visit (HOSPITAL_COMMUNITY)
Admission: RE | Admit: 2021-03-15 | Discharge: 2021-03-15 | Disposition: A | Discharge status: Home / Self Care | Payer: BC Managed Care – PPO | Source: Ambulatory Visit | Attending: Gastroenterology | Admitting: Gastroenterology

## 2021-03-15 DIAGNOSIS — C19 Malignant neoplasm of rectosigmoid junction: Secondary | ICD-10-CM | POA: Diagnosis not present

## 2021-03-15 DIAGNOSIS — D492 Neoplasm of unspecified behavior of bone, soft tissue, and skin: Secondary | ICD-10-CM | POA: Diagnosis not present

## 2021-03-15 DIAGNOSIS — R590 Localized enlarged lymph nodes: Secondary | ICD-10-CM | POA: Diagnosis not present

## 2021-03-15 DIAGNOSIS — C20 Malignant neoplasm of rectum: Secondary | ICD-10-CM | POA: Diagnosis not present

## 2021-03-15 DIAGNOSIS — K4020 Bilateral inguinal hernia, without obstruction or gangrene, not specified as recurrent: Secondary | ICD-10-CM | POA: Diagnosis not present

## 2021-03-15 NOTE — Telephone Encounter (Signed)
Spoke with patient regarding CT scans results.  Per Dr. Chryl Heck informed him good news shows no distant metastasis.  He verbalized an understanding and appreciated the call.

## 2021-03-15 NOTE — Telephone Encounter (Signed)
-----   Message from [name], MD sent at 03/15/2021  9:43 AM EDT ----- Malachy Mood  If you want to give him the good news, no distant metastatic disease. Once MRI is done, we should have the exact stage in most patients.  Thanks,

## 2021-03-20 ENCOUNTER — Other Ambulatory Visit: Payer: Self-pay

## 2021-03-20 NOTE — Progress Notes (Signed)
The proposed treatment discussed in conference is for discussion purpose only and is not a binding recommendation.  The patients have not been physically examined, or presented with their treatment options.  Therefore, treatment plans cannot be decided.

## 2021-03-21 ENCOUNTER — Other Ambulatory Visit: Payer: Self-pay

## 2021-03-21 ENCOUNTER — Ambulatory Visit
Admission: RE | Admit: 2021-03-21 | Discharge: 2021-03-21 | Disposition: A | Discharge status: Home / Self Care | Payer: BC Managed Care – PPO | Source: Ambulatory Visit | Attending: Radiation Oncology | Admitting: Radiation Oncology

## 2021-03-21 ENCOUNTER — Encounter: Payer: Self-pay | Admitting: Hematology and Oncology

## 2021-03-21 ENCOUNTER — Inpatient Hospital Stay: Payer: BC Managed Care – PPO

## 2021-03-21 ENCOUNTER — Telehealth: Payer: Self-pay | Admitting: Hematology and Oncology

## 2021-03-21 ENCOUNTER — Encounter: Payer: Self-pay | Admitting: Radiation Oncology

## 2021-03-21 ENCOUNTER — Inpatient Hospital Stay: Payer: BC Managed Care – PPO | Attending: Hematology and Oncology | Admitting: Hematology and Oncology

## 2021-03-21 VITALS — BP 134/87 | HR 83 | Temp 97.2°F | Resp 18 | Ht 69.0 in | Wt 216.0 lb

## 2021-03-21 VITALS — BP 143/93 | HR 97 | Temp 97.2°F | Resp 18 | Wt 216.2 lb

## 2021-03-21 DIAGNOSIS — I2699 Other pulmonary embolism without acute cor pulmonale: Secondary | ICD-10-CM | POA: Diagnosis not present

## 2021-03-21 DIAGNOSIS — Z9049 Acquired absence of other specified parts of digestive tract: Secondary | ICD-10-CM | POA: Insufficient documentation

## 2021-03-21 DIAGNOSIS — G4733 Obstructive sleep apnea (adult) (pediatric): Secondary | ICD-10-CM | POA: Insufficient documentation

## 2021-03-21 DIAGNOSIS — R911 Solitary pulmonary nodule: Secondary | ICD-10-CM | POA: Diagnosis not present

## 2021-03-21 DIAGNOSIS — G4730 Sleep apnea, unspecified: Secondary | ICD-10-CM | POA: Insufficient documentation

## 2021-03-21 DIAGNOSIS — R161 Splenomegaly, not elsewhere classified: Secondary | ICD-10-CM | POA: Insufficient documentation

## 2021-03-21 DIAGNOSIS — K5900 Constipation, unspecified: Secondary | ICD-10-CM | POA: Diagnosis not present

## 2021-03-21 DIAGNOSIS — J45909 Unspecified asthma, uncomplicated: Secondary | ICD-10-CM | POA: Diagnosis not present

## 2021-03-21 DIAGNOSIS — N200 Calculus of kidney: Secondary | ICD-10-CM | POA: Insufficient documentation

## 2021-03-21 DIAGNOSIS — Z833 Family history of diabetes mellitus: Secondary | ICD-10-CM | POA: Diagnosis not present

## 2021-03-21 DIAGNOSIS — Z79899 Other long term (current) drug therapy: Secondary | ICD-10-CM | POA: Insufficient documentation

## 2021-03-21 DIAGNOSIS — I10 Essential (primary) hypertension: Secondary | ICD-10-CM | POA: Diagnosis not present

## 2021-03-21 DIAGNOSIS — Z86711 Personal history of pulmonary embolism: Secondary | ICD-10-CM | POA: Diagnosis not present

## 2021-03-21 DIAGNOSIS — Z8249 Family history of ischemic heart disease and other diseases of the circulatory system: Secondary | ICD-10-CM | POA: Diagnosis not present

## 2021-03-21 DIAGNOSIS — E119 Type 2 diabetes mellitus without complications: Secondary | ICD-10-CM | POA: Diagnosis not present

## 2021-03-21 DIAGNOSIS — C20 Malignant neoplasm of rectum: Secondary | ICD-10-CM | POA: Diagnosis not present

## 2021-03-21 DIAGNOSIS — Z791 Long term (current) use of non-steroidal anti-inflammatories (NSAID): Secondary | ICD-10-CM | POA: Insufficient documentation

## 2021-03-21 DIAGNOSIS — Z803 Family history of malignant neoplasm of breast: Secondary | ICD-10-CM | POA: Insufficient documentation

## 2021-03-21 DIAGNOSIS — Z7952 Long term (current) use of systemic steroids: Secondary | ICD-10-CM | POA: Diagnosis not present

## 2021-03-21 DIAGNOSIS — Z7901 Long term (current) use of anticoagulants: Secondary | ICD-10-CM | POA: Insufficient documentation

## 2021-03-21 DIAGNOSIS — C19 Malignant neoplasm of rectosigmoid junction: Secondary | ICD-10-CM | POA: Diagnosis not present

## 2021-03-21 DIAGNOSIS — E785 Hyperlipidemia, unspecified: Secondary | ICD-10-CM | POA: Diagnosis not present

## 2021-03-21 LAB — CBC WITH DIFFERENTIAL/PLATELET
Abs Immature Granulocytes: 0.02 10*3/uL (ref 0.00–0.07)
Basophils Absolute: 0 10*3/uL (ref 0.0–0.1)
Basophils Relative: 1 %
Eosinophils Absolute: 0.3 10*3/uL (ref 0.0–0.5)
Eosinophils Relative: 4 %
HCT: 44.6 % (ref 39.0–52.0)
Hemoglobin: 14.6 g/dL (ref 13.0–17.0)
Immature Granulocytes: 0 %
Lymphocytes Relative: 36 %
Lymphs Abs: 2.1 10*3/uL (ref 0.7–4.0)
MCH: 25.2 pg — ABNORMAL LOW (ref 26.0–34.0)
MCHC: 32.7 g/dL (ref 30.0–36.0)
MCV: 76.9 fL — ABNORMAL LOW (ref 80.0–100.0)
Monocytes Absolute: 0.4 10*3/uL (ref 0.1–1.0)
Monocytes Relative: 7 %
Neutro Abs: 3.1 10*3/uL (ref 1.7–7.7)
Neutrophils Relative %: 52 %
Platelets: 204 10*3/uL (ref 150–400)
RBC: 5.8 MIL/uL (ref 4.22–5.81)
RDW: 15 % (ref 11.5–15.5)
WBC: 5.9 10*3/uL (ref 4.0–10.5)
nRBC: 0 % (ref 0.0–0.2)

## 2021-03-21 LAB — CMP (CANCER CENTER ONLY)
ALT: 27 U/L (ref 0–44)
AST: 20 U/L (ref 15–41)
Albumin: 4.3 g/dL (ref 3.5–5.0)
Alkaline Phosphatase: 56 U/L (ref 38–126)
Anion gap: 13 (ref 5–15)
BUN: 18 mg/dL (ref 6–20)
CO2: 23 mmol/L (ref 22–32)
Calcium: 9.7 mg/dL (ref 8.9–10.3)
Chloride: 102 mmol/L (ref 98–111)
Creatinine: 1.03 mg/dL (ref 0.61–1.24)
GFR, Estimated: >60 mL/min (ref >60)
Glucose, Bld: 140 mg/dL — ABNORMAL HIGH (ref 70–99)
Potassium: 3.7 mmol/L (ref 3.5–5.1)
Sodium: 138 mmol/L (ref 135–145)
Total Bilirubin: 1.1 mg/dL (ref 0.3–1.2)
Total Protein: 7.5 g/dL (ref 6.5–8.1)

## 2021-03-21 MED ORDER — PROCHLORPERAZINE MALEATE 10 MG PO TABS: 10.0000 mg | ORAL_TABLET | Freq: Four times a day (QID) | ORAL | 1 refills | Status: DC | PRN

## 2021-03-21 MED ORDER — CAPECITABINE 500 MG PO TABS: ORAL_TABLET | ORAL | 0 refills | Status: DC

## 2021-03-21 MED ORDER — ONDANSETRON HCL 8 MG PO TABS: 8.0000 mg | ORAL_TABLET | Freq: Two times a day (BID) | ORAL | 1 refills | Status: DC | PRN

## 2021-03-21 NOTE — Progress Notes (Addendum)
[place] CONSULT NOTE  Patient Care Team: Shirline Frees, MD as PCP - General (Family Medicine) [name], MD as Consulting Physician (Hematology and Oncology) Jonnie Finner, RN as Oncology Nurse Navigator  CHIEF COMPLAINTS/PURPOSE OF CONSULTATION:  New diagnosis of adenocarcinoma rectum,  ASSESSMENT & PLAN:  Adenocarcinoma of rectum [facility]) This is a very pleasant 50 year old male patient with newly diagnosed T3BN0 adenocarcinoma of the rectum, MSS with no evidence of distant metastasis following up with medical oncology for consideration of concurrent chemoradiation.  [name] is here for a follow-up with his wife.  Today we have discussed about his clinical staging which is T3BN0 or stage IIa adenocarcinoma of the rectum and treatment recommendations which include concurrent chemotherapy radiation with Xeloda versus 5-fluorouracil followed by consideration for surgery.  We have discussed adverse effects of chemotherapy including but not limited to fatigue, nausea, vomiting, diarrhea, hand-foot syndrome with Xeloda, myelosuppression, small risk of coronary vasospasm and increased risk of infections.  He understands that some of the side effects of chemo can be life threatening.  He is agreeable to all these recommendations. Referral placed to general surgery for recommendations. We have discussed about questionable role of adjuvant chemotherapy in this setting. He will continue anticoagulation during the treatment. All his questions were answered to the best of my knowledge.  Chemo education will be scheduled.  Medication will be dispensed to his home.  We will plan to see him every 2 weeks for follow-up while he is on Xeloda and radiation. Referral placed to genetics, given young age at diagnosis.  Addendum: I discussed the case with Steven [name] from Dr [facility] office and we were a little concerned about the small LN which is measuring 4 mm. I called the patient and  also discussed about total neoadjuvant approach given these concerns, discussed that in this approach, he would do the chemo first, followed by CRT and then surgery. This approach can sometimes give [place] a better chance for sphincter preservation and also could be easier on the patient since chemo is done before surgery. He however thinks the current plan works better for his short term disability purposes, and he would like to keep it the same. He understands that adjuvant chemo will be needed if we proceed with concurrent CRT.   Orders Placed This Encounter  Procedures   CBC with Differential/Platelet    Standing Status:   Standing    Number of Occurrences:   22    Standing Expiration Date:   03/21/2022   CMP ([place] only)    Standing Status:   Future    Standing Expiration Date:   03/21/2022   CEA (IN HOUSE-[facility])    Standing Status:   Standing    Number of Occurrences:   3    Standing Expiration Date:   03/21/2022   Ambulatory referral to General Surgery    Referral Priority:   Urgent    Referral Type:   Surgical    Referral Reason:   Specialty Services Required    Requested Specialty:   General Surgery    Number of Visits Requested:   1   Ambulatory referral to Genetics    Referral Priority:   Routine    Referral Type:   Consultation    Referral Reason:   Specialty Services Required    Number of Visits Requested:   1     HISTORY OF PRESENTING ILLNESS:  Steven [name] 50 y.o. male is here because of new diagnosis of  rectal cancer  This is a well-known patient to me who was previously seen for unprovoked bilateral pulmonary embolism.  During evaluation of his unprovoked PE, we did hypercoagulable work-up and we also requested imaging to rule out occult malignancy.  This CT abdomen identified asymmetric soft tissue changes along the left of the rectum and hence I recommended a colonoscopy referral was placed to Dr. Donneta Romberg office.  He had colonoscopy in June which showed a  palpable rectal mass on digital exam likely malignant appearing in the rectum identified beginning 8 cm from the anal verge extending to 13 cm above the anal verge partially circumferential and oozing was present.  He had pathology which showed well-differentiated adenocarcinoma.  Staging CT scans failed to show any evidence of metastatic disease.  MRI pelvis stage T3bN0.   He was discussed in the GI tumor board and the recommendation was to consider concurrent chemoradiation followed by surgical evaluation. He is here for follow-up with his wife.  He denies any rectal discomfort or bleeding per rectum, has some constipation and has been taking some stool softeners every day.  He was also started on oral iron supplementation by Dr. Rush Landmark. No change in breathing, no weight loss reported. Rest of the pertinent 10 point ROS reviewed and negative.  MEDICAL HISTORY:  Past Medical History:  Diagnosis Date   Anxiety    Asthma    Depression    Diabetes mellitus without complication (HCC)    HLD (hyperlipidemia)    Hypertension    Kidney stones    OSA (obstructive sleep apnea)    CPAP    SURGICAL HISTORY: Past Surgical History:  Procedure Laterality Date   APPENDECTOMY     CYST EXCISION     left neck   WISDOM TOOTH EXTRACTION      SOCIAL HISTORY: Social History   Socioeconomic History   Marital status: Married    Spouse name: Not on file   Number of children: 3   Years of education: Not on file   Highest education level: Not on file  Occupational History   Occupation: Gaffer  Tobacco Use   Smoking status: Never   Smokeless tobacco: Never  Vaping Use   Vaping Use: Never used  Substance and Sexual Activity   Alcohol use: Yes    Comment: 1 beer every 3-4 months   Drug use: Never   Sexual activity: Not on file  Other Topics Concern   Not on file  Social History Narrative   Not on file   Social Determinants of Health   Financial Resource Strain: Not on file   Food Insecurity: Not on file  Transportation Needs: Not on file  Physical Activity: Not on file  Stress: Not on file  Social Connections: Not on file  Intimate Partner Violence: Not on file    FAMILY HISTORY: Family History  Problem Relation Age of Onset   Diabetes Mellitus II Mother    Breast cancer Mother 45   Thrombophlebitis Father    Breast cancer Maternal Grandmother 45   Diabetes Maternal Grandmother    Breast cancer Maternal Aunt 60   Diabetes Maternal Aunt    Hypertension Paternal Grandmother    Colon cancer Neg Hx    Esophageal cancer Neg Hx    Inflammatory bowel disease Neg Hx    Liver disease Neg Hx    Pancreatic cancer Neg Hx    Rectal cancer Neg Hx    Stomach cancer Neg Hx     ALLERGIES:  is allergic to contrast media [iodinated diagnostic agents].  MEDICATIONS:  Current Outpatient Medications  Medication Sig Dispense Refill   acetaminophen (TYLENOL) 500 MG tablet Take 1,000 mg by mouth every 6 (six) hours as needed for mild pain.     albuterol (VENTOLIN HFA) 108 (90 Base) MCG/ACT inhaler Inhale 2 puffs into the lungs every 4 (four) hours as needed for wheezing.     ALPRAZolam (XANAX) 0.25 MG tablet Take 0.25 mg by mouth daily as needed for anxiety or sleep.     blood glucose meter kit and supplies KIT Dispense based on patient and insurance preference. Use up to four times daily as directed. (FOR ICD-9 250.00, 250.01). 1 each 0   fenofibrate 160 MG tablet Take 160 mg by mouth daily.     irbesartan (AVAPRO) 150 MG tablet Take 150 mg by mouth daily.     meloxicam (MOBIC) 15 MG tablet Take 15 mg by mouth at bedtime.      polycarbophil (FIBERCON) 625 MG tablet Take 625 mg by mouth in the morning and at bedtime.     tetrahydrozoline-zinc (VISINE-AC) 0.05-0.25 % ophthalmic solution Place 2 drops into both eyes as needed (allergy).     XARELTO 20 MG TABS tablet Take 20 mg by mouth daily.     XIGDUO XR 01-999 MG TB24 Take 2 tablets by mouth daily.     capecitabine  (XELODA) 500 MG tablet Take Monday through Friday during radiation. Use 4 tablets in AM and 3 tablets in PM 150 tablet 0   Cinnamon 500 MG capsule See admin instructions. 1000 mg daily     Ferrous Sulfate (IRON) 325 (65 Fe) MG TABS Take 325 mg by mouth daily.     ondansetron (ZOFRAN) 8 MG tablet Take 1 tablet (8 mg total) by mouth 2 (two) times daily as needed (Nausea or vomiting). 30 tablet 1   polycarbophil (FIBERCON) 625 MG tablet Take 625 mg by mouth daily. 2 tabs per day     predniSONE (DELTASONE) 50 MG tablet Take one 50 mg tablet by mouth 13 hours prior to scan, one 50 mg tablet by mouth 7 hours prior to scan and one 50 mg tablet 1 hour before scan. (Patient not taking: Reported on 03/21/2021) 3 tablet 0   prochlorperazine (COMPAZINE) 10 MG tablet Take 1 tablet (10 mg total) by mouth every 6 (six) hours as needed (Nausea or vomiting). 30 tablet 1   No current facility-administered medications for this visit.     PHYSICAL EXAMINATION:  ECOG PERFORMANCE STATUS: 0 - Asymptomatic  Vitals:   03/21/21 1220  BP: (!) 143/93  Pulse: 97  Resp: 18  Temp: (!) 97.2 F (36.2 C)  SpO2: 100%   Filed Weights   03/21/21 1220  Weight: 216 lb 3.2 oz (98.1 kg)    GENERAL:alert, no distress and comfortable, obese SKIN: skin color, texture, turgor are normal, no rashes or significant lesions EYES: normal, conjunctiva are pink and non-injected, sclera clear OROPHARYNX:no exudate, no erythema and lips, buccal mucosa, and tongue normal  NECK: supple, thyroid normal size, non-tender, without nodularity LYMPH:  no palpable lymphadenopathy in the cervical, axillary or inguinal LUNGS: clear to auscultation and percussion with normal breathing effort HEART: regular rate & rhythm and no murmurs and no lower extremity edema ABDOMEN:abdomen soft, non-tender and normal bowel sounds Musculoskeletal:no cyanosis of digits and no clubbing  PSYCH: alert & oriented x 3 with fluent speech NEURO: no focal  motor/sensory deficits  LABORATORY DATA:  I have reviewed  the data as listed Lab Results  Component Value Date   WBC 8.8 03/05/2021   HGB 14.1 03/05/2021   HCT 42.2 03/05/2021   MCV 74.5 (L) 03/05/2021   PLT 194.0 Repeated and verified X2. 03/05/2021     Chemistry      Component Value Date/Time   NA 141 03/05/2021 0917   K 4.0 03/05/2021 0917   CL 107 03/05/2021 0917   CO2 17 (L) 03/05/2021 0917   BUN 14 03/05/2021 0917   CREATININE 0.95 03/05/2021 0917      Component Value Date/Time   CALCIUM 9.0 03/05/2021 0917   ALKPHOS 48 03/05/2021 0917   AST 24 03/05/2021 0917   ALT 21 03/05/2021 0917   BILITOT 0.8 03/05/2021 0917       RADIOGRAPHIC STUDIES: I have personally reviewed the radiological images as listed and agreed with the findings in the report. MR PELVIS WO CONTRAST  Result Date: 03/15/2021 CLINICAL DATA:  New diagnosis of rectal cancer. EXAM: MRI PELVIS WITHOUT CONTRAST TECHNIQUE: Multiplanar multisequence MR imaging of the pelvis was performed. No intravenous contrast was administered. Ultrasound gel was administered per rectum to optimize tumor evaluation. COMPARISON:  CTs, including 03/14/2021. The report of the colonoscopy of 03/05/2021 is also reviewed. FINDINGS: TUMOR LOCATION Tumor distance from Anal Verge/Skin Surface: 13 cm on 25/2. Tumor distance to Internal Anal Sphincter: 2.2 cm on 23/4 TUMOR DESCRIPTION Circumferential Extent: Approximately 180 degrees, centered about the 4 o'clock position on 24/6. Measures 4.7 x 2.3 cm on 13/3. Tumor Length: 3.9 cm on 23/5. T - CATEGORY Extension through Muscularis Propria: Present. Measures maximally 4 mm on 24/6. T3b Shortest Distance of any tumor/node from Mesorectal Fascia: 8 mm on 24/6. Extramural Vascular Invasion/Tumor Thrombus: Absent Invasion of Anterior Peritoneal Reflection: Absent Involvement of Adjacent Organs or Pelvic Sidewall: No Levator Ani Involvement: No N - CATEGORY Mesorectal Lymph Nodes >=79m: None.  There is a left-sided node at the 3 o'clock position of 4 mm on 24/6. Not pathologic by size criteria. Extra-mesorectal Lymphadenopathy: Absent Other: No significant free fluid. Normal prostate. Tiny bilateral fat containing inguinal hernias. No bowel obstruction. Normal urinary bladder. IMPRESSION: Rectal adenocarcinoma T stage: T3b Rectal adenocarcinoma N stage:  0 Distance from tumor to the internal anal sphincter is 2.2 cm. Electronically Signed   By: KAbigail MiyamotoM.D.   On: 03/15/2021 14:27   CT CHEST ABDOMEN PELVIS W CONTRAST  Result Date: 03/14/2021 CLINICAL DATA:  Colorectal carcinoma staging. EXAM: CT CHEST, ABDOMEN, AND PELVIS WITH CONTRAST TECHNIQUE: Multidetector CT imaging of the chest, abdomen and pelvis was performed following the standard protocol during bolus administration of intravenous contrast. CONTRAST:  1064mOMNIPAQUE IOHEXOL 300 MG/ML  SOLN COMPARISON:  CT AP 11/23/2020 and CT angio chest 07/05/2020. FINDINGS: CT CHEST FINDINGS Cardiovascular: No significant vascular findings. Normal heart size. No pericardial effusion. Mediastinum/Nodes: No enlarged mediastinal, hilar, or axillary lymph nodes. Thyroid gland, trachea, and esophagus demonstrate no significant findings. Lungs/Pleura: Tiny nodule in the superior segment of left lower lobe measures 3 mm, image 35/4. Unchanged from 04/24/2019. No new suspicious lung nodules identified. Musculoskeletal: No chest wall mass or suspicious bone lesions identified. CT ABDOMEN PELVIS FINDINGS Hepatobiliary: No focal liver abnormality is seen. No gallstones, gallbladder wall thickening, or biliary dilatation. Pancreas: Unremarkable. No pancreatic ductal dilatation or surrounding inflammatory changes. Spleen: The spleen measures 14.6 by 10.8 by 5.3 cm (volume = 440 cm^3) no focal splenic lesion. Adrenals/Urinary Tract: Normal adrenal glands. The right kidney is unremarkable. Small stone within upper pole  of left kidney measures 3 mm, image 68/2.  Several small cysts are noted which measure less than 1 cm and are technically too small to reliably characterize. No hydronephrosis. Urinary bladder is unremarkable. Stomach/Bowel: The stomach appears normal. No bowel wall thickening, inflammation, or distension. Colonic neoplasm at the level of the rectum is identified measuring approximately 4.8 cm in length and 3.3 cm in diameter, image 103/6. Vascular/Lymphatic: Normal abdominal aorta. No adenopathy identified within the upper abdomen. No pelvic or inguinal adenopathy. A few tiny perirectal lymph nodes are noted within the lower pelvis which measure up to 4 mm. Reproductive: Prostate is unremarkable. Other: No free fluid or fluid collections. No signs of peritoneal disease. Musculoskeletal: No acute or significant osseous findings. IMPRESSION: 1. Colonic neoplasm at the level of the rectum is identified measuring approximately 4.8 cm in length and 3.3 cm in diameter. 2. A few tiny perirectal lymph nodes are noted within the lower pelvis which measure up to 4 mm. 3. No findings to suggest solid organ metastasis or nodal metastasis within the chest, abdomen or pelvis. 4. Splenomegaly. 5. Nonobstructing left renal calculus. 6. Tiny nodule in the superior segment of left lower lobe measures 3 mm. This was present on CT from 04/24/2019 and is unchanged compatible with a benign nodule. Electronically Signed   By: Kerby Moors M.D.   On: 03/14/2021 17:08    All questions were answered. The patient knows to call the clinic with any problems, questions or concerns. I spent 45 minutes in the care of this patient including H and P, review of records, counseling and coordination of care. We have reviewed recent imaging, pathology report, treatment recommendations, adverse effects of chemotherapy in detail today.    [name], MD 03/21/2021 3:07 PM

## 2021-03-21 NOTE — Progress Notes (Signed)
Met with patient and his wife Di Kindle today at medical oncology consult with Dr. Chryl Heck for newly diagnosed rectal adenocarcinoma.  I had previously spoken with the patient on the phone.  I explained my role as nurse navigator and they were given my direct contact information.  I informed them that I am retiring and that [facility] RN will be assuming my role.  I escorted them to scheduling and then to radiation oncology for his consult with Dr. Lisbeth Renshaw.

## 2021-03-21 NOTE — Telephone Encounter (Signed)
Scheduled appointment per 06/23 sch msg. Patient is aware. 

## 2021-03-21 NOTE — Assessment & Plan Note (Addendum)
This is a very pleasant 50 year old male patient with newly diagnosed T3BN0 adenocarcinoma of the rectum, MSS with no evidence of distant metastasis following up with medical oncology for consideration of concurrent chemoradiation.  Steven [name] is here for a follow-up with his wife.  Today we have discussed about his clinical staging which is T3N0 or stage IIa adenocarcinoma of the rectum and treatment recommendations from TB suggested concurrent chemotherapy radiation with Xeloda versus 5-fluorouracil followed by consideration for surgery and adjuvant chemotherapy.  We have discussed adverse effects of chemotherapy including but not limited to fatigue, nausea, vomiting, diarrhea, hand-foot syndrome with Xeloda, myelosuppression, small risk of coronary vasospasm and increased risk of infections.  He understands that some of the side effects of chemo can be life threatening.  He is agreeable to all these recommendations. Referral placed to general surgery for recommendations. We have discussed about questionable role of adjuvant chemotherapy in this setting. He will continue anticoagulation during the treatment. All his questions were answered to the best of my knowledge.  Chemo education will be scheduled.  Medication will be dispensed to his home.  We will plan to see him every 2 weeks for follow-up while he is on Xeloda and radiation. Referral placed to genetics, given young age at diagnosis.

## 2021-03-21 NOTE — Progress Notes (Signed)
Radiation Oncology         [phone] ________________________________  Name: Steven [name]        MRN: [id number]  Date of Service: 03/21/2021 DOB: 16-Apr-1971  WC:HENIDP, Steven Saxon, MD  [name], MD     REFERRING PHYSICIAN: [name], MD   DIAGNOSIS: The encounter diagnosis was Rectal cancer [facility]).   HISTORY OF PRESENT ILLNESS: Steven [name] is a 50 y.o. male seen at the request of Dr. Chryl Heck for a new diagnosis of rectal cancer.  The patient was found to have acute bilateral right greater than left pulmonary emboli with right heart strain, this was felt to be unprovoked and genetic testing for hypercoagulable conditions was negative.  No primary site of malignancy was identified CT imaging 2 mm nonobstructing stone in the upper pole of the right kidney and asymmetric soft tissue changes along the left of the rectum were noted.  It was recommended that he undergo colonoscopy which was performed on 03/05/2021 by Dr. Rush Landmark a palpable rectal mass on digital exam, unlikely malignant appearing tumor in the rectum was identified beginning 8 cm from the anal verge extending to 13 cm above the anal verge partially circumferential and oozing was present.  Final pathology from biopsies showed well differentiated adenocarcinoma.  He proceeded with a staging CT chest abdomen and pelvis on 03/14/2021 showing a 4.8 cm rectal mass that was 3.3 cm in diameter with a few perirectal lymph nodes noted within the lower pelvis measuring up to 4 mm.  No solid organ metastases were identified.  A tiny nodule in the left lower lobe measuring 3 mm and was unchanged from CT imaging in July 2020.  He underwent an MRI of the pelvis on 03/15/2021 showing extension through the muscularis propria maximally up to 4 mm, his T staging is felt to be cT3bN0. The N staging discussion in the report and in multidisciplinary GI conference was that there were no nodes greater or equal to 5 mm but a left-sided nodule in the 3  o'clock position did measure 4 mm but not pathologic by size criteria.  Given these findings he is seen to discuss chemoradiation.     PREVIOUS RADIATION THERAPY: No   PAST MEDICAL HISTORY:  Past Medical History:  Diagnosis Date   Anxiety    Asthma    Depression    Diabetes mellitus without complication (HCC)    HLD (hyperlipidemia)    Hypertension    Kidney stones    OSA (obstructive sleep apnea)    CPAP       PAST SURGICAL HISTORY: Past Surgical History:  Procedure Laterality Date   APPENDECTOMY     CYST EXCISION     left neck   WISDOM TOOTH EXTRACTION       FAMILY HISTORY:  Family History  Problem Relation Age of Onset   Diabetes Mellitus II Mother    Breast cancer Mother 70   Thrombophlebitis Father    Breast cancer Maternal Grandmother 81   Diabetes Maternal Grandmother    Breast cancer Maternal Aunt 60   Diabetes Maternal Aunt    Hypertension Paternal Grandmother    Colon cancer Neg Hx    Esophageal cancer Neg Hx    Inflammatory bowel disease Neg Hx    Liver disease Neg Hx    Pancreatic cancer Neg Hx    Rectal cancer Neg Hx    Stomach cancer Neg Hx      SOCIAL HISTORY:  reports that he has never smoked. He  has never used smokeless tobacco. He reports current alcohol use. He reports that he does not use drugs. The patient is married and lives in [place]. 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   ALLERGIES: Contrast media [iodinated diagnostic agents]   MEDICATIONS:  Current Outpatient Medications  Medication Sig Dispense Refill   acetaminophen (TYLENOL) 500 MG tablet Take 1,000 mg by mouth every 6 (six) hours as needed for mild pain.     albuterol (VENTOLIN HFA) 108 (90 Base) MCG/ACT inhaler Inhale 2 puffs into the lungs every 4 (four) hours as needed for wheezing.     ALPRAZolam (XANAX) 0.25 MG tablet Take 0.25 mg by mouth daily as needed for anxiety or sleep.     blood glucose meter kit and supplies KIT Dispense based on patient and  insurance preference. Use up to four times daily as directed. (FOR ICD-9 250.00, 250.01). 1 each 0   Cinnamon 500 MG capsule See admin instructions. 1000 mg daily     fenofibrate 160 MG tablet Take 160 mg by mouth daily.     Ferrous Sulfate (IRON) 325 (65 Fe) MG TABS Take 325 mg by mouth daily.     irbesartan (AVAPRO) 150 MG tablet Take 150 mg by mouth daily.     meloxicam (MOBIC) 15 MG tablet Take 15 mg by mouth at bedtime.      polycarbophil (FIBERCON) 625 MG tablet Take 625 mg by mouth daily. 2 tabs per day     tetrahydrozoline-zinc (VISINE-AC) 0.05-0.25 % ophthalmic solution Place 2 drops into both eyes as needed (allergy).     XARELTO 20 MG TABS tablet Take 20 mg by mouth daily.     XIGDUO XR 01-999 MG TB24 Take 2 tablets by mouth daily.     capecitabine (XELODA) 500 MG tablet Take Monday through Friday during radiation. Use 4 tablets in AM and 3 tablets in PM 150 tablet 0   ondansetron (ZOFRAN) 8 MG tablet Take 1 tablet (8 mg total) by mouth 2 (two) times daily as needed (Nausea or vomiting). 30 tablet 1   polycarbophil (FIBERCON) 625 MG tablet Take 625 mg by mouth in the morning and at bedtime.     predniSONE (DELTASONE) 50 MG tablet Take one 50 mg tablet by mouth 13 hours prior to scan, one 50 mg tablet by mouth 7 hours prior to scan and one 50 mg tablet 1 hour before scan. (Patient not taking: Reported on 03/21/2021) 3 tablet 0   prochlorperazine (COMPAZINE) 10 MG tablet Take 1 tablet (10 mg total) by mouth every 6 (six) hours as needed (Nausea or vomiting). 30 tablet 1   No current facility-administered medications for this encounter.     REVIEW OF SYSTEMS: On review of systems, the patient reports that he is doing well overall. He has been tired, but denies any bowel or bladder disturbances, rectal bleeding, and denies abdominal pain, nausea or vomiting. No other complaints are verbalized.     PHYSICAL EXAM:  Wt Readings from Last 3 Encounters:  03/21/21 216 lb (98 kg)  03/21/21  216 lb 3.2 oz (98.1 kg)  03/05/21 218 lb (98.9 kg)   Temp Readings from Last 3 Encounters:  03/21/21 (!) 97.2 F (36.2 C)  03/21/21 (!) 97.2 F (36.2 C) (Tympanic)  03/05/21 (!) 97.1 F (36.2 C)   BP Readings from Last 3 Encounters:  03/21/21 134/87  03/21/21 (!) 143/93  03/05/21 (!) 118/58   Pulse Readings from Last 3 Encounters:  03/21/21 83  03/21/21 97  03/05/21 81   Pain Assessment Pain Score: 0-No pain/10  In general this is a well appearing caucasian male in no acute distress. He's alert and oriented x4 and appropriate throughout the examination. Cardiopulmonary assessment is negative for acute distress and he exhibits normal effort.     ECOG = 0  0 - Asymptomatic (Fully active, able to carry on all predisease activities without restriction)  1 - Symptomatic but completely ambulatory (Restricted in physically strenuous activity but ambulatory and able to carry out work of a light or sedentary nature. For example, light housework, office work)  2 - Symptomatic, <50% in bed during the day (Ambulatory and capable of all self care but unable to carry out any work activities. Up and about more than 50% of waking hours)  3 - Symptomatic, >50% in bed, but not bedbound (Capable of only limited self-care, confined to bed or chair 50% or more of waking hours)  4 - Bedbound (Completely disabled. Cannot carry on any self-care. Totally confined to bed or chair)  5 - Death   Eustace Pen MM, Creech RH, Tormey DC, et al. [phone]). "Toxicity and response criteria of the [facility]". [place] Oncol. 5 (6): 649-55    LABORATORY DATA:  Lab Results  Component Value Date   WBC 8.8 03/05/2021   HGB 14.1 03/05/2021   HCT 42.2 03/05/2021   MCV 74.5 (L) 03/05/2021   PLT 194.0 Repeated and verified X2. 03/05/2021   Lab Results  Component Value Date   NA 141 03/05/2021   K 4.0 03/05/2021   CL 107 03/05/2021   CO2 17 (L) 03/05/2021   Lab Results  Component  Value Date   ALT 21 03/05/2021   AST 24 03/05/2021   ALKPHOS 48 03/05/2021   BILITOT 0.8 03/05/2021      RADIOGRAPHY: MR PELVIS WO CONTRAST  Result Date: 03/15/2021 CLINICAL DATA:  New diagnosis of rectal cancer. EXAM: MRI PELVIS WITHOUT CONTRAST TECHNIQUE: Multiplanar multisequence MR imaging of the pelvis was performed. No intravenous contrast was administered. Ultrasound gel was administered per rectum to optimize tumor evaluation. COMPARISON:  CTs, including 03/14/2021. The report of the colonoscopy of 03/05/2021 is also reviewed. FINDINGS: TUMOR LOCATION Tumor distance from Anal Verge/Skin Surface: 13 cm on 25/2. Tumor distance to Internal Anal Sphincter: 2.2 cm on 23/4 TUMOR DESCRIPTION Circumferential Extent: Approximately 180 degrees, centered about the 4 o'clock position on 24/6. Measures 4.7 x 2.3 cm on 13/3. Tumor Length: 3.9 cm on 23/5. T - CATEGORY Extension through Muscularis Propria: Present. Measures maximally 4 mm on 24/6. T3b Shortest Distance of any tumor/node from Mesorectal Fascia: 8 mm on 24/6. Extramural Vascular Invasion/Tumor Thrombus: Absent Invasion of Anterior Peritoneal Reflection: Absent Involvement of Adjacent Organs or Pelvic Sidewall: No Levator Ani Involvement: No N - CATEGORY Mesorectal Lymph Nodes >=31mm: None. There is a left-sided node at the 3 o'clock position of 4 mm on 24/6. Not pathologic by size criteria. Extra-mesorectal Lymphadenopathy: Absent Other: No significant free fluid. Normal prostate. Tiny bilateral fat containing inguinal hernias. No bowel obstruction. Normal urinary bladder. IMPRESSION: Rectal adenocarcinoma T stage: T3b Rectal adenocarcinoma N stage:  0 Distance from tumor to the internal anal sphincter is 2.2 cm. Electronically Signed   By: Abigail Miyamoto M.D.   On: 03/15/2021 14:27   CT CHEST ABDOMEN PELVIS W CONTRAST  Result Date: 03/14/2021 CLINICAL DATA:  Colorectal carcinoma staging. EXAM: CT CHEST, ABDOMEN, AND PELVIS WITH CONTRAST TECHNIQUE:  Multidetector CT imaging of the chest, abdomen and  pelvis was performed following the standard protocol during bolus administration of intravenous contrast. CONTRAST:  19mL OMNIPAQUE IOHEXOL 300 MG/ML  SOLN COMPARISON:  CT AP 11/23/2020 and CT angio chest 07/05/2020. FINDINGS: CT CHEST FINDINGS Cardiovascular: No significant vascular findings. Normal heart size. No pericardial effusion. Mediastinum/Nodes: No enlarged mediastinal, hilar, or axillary lymph nodes. Thyroid gland, trachea, and esophagus demonstrate no significant findings. Lungs/Pleura: Tiny nodule in the superior segment of left lower lobe measures 3 mm, image 35/4. Unchanged from 04/24/2019. No new suspicious lung nodules identified. Musculoskeletal: No chest wall mass or suspicious bone lesions identified. CT ABDOMEN PELVIS FINDINGS Hepatobiliary: No focal liver abnormality is seen. No gallstones, gallbladder wall thickening, or biliary dilatation. Pancreas: Unremarkable. No pancreatic ductal dilatation or surrounding inflammatory changes. Spleen: The spleen measures 14.6 by 10.8 by 5.3 cm (volume = 440 cm^3) no focal splenic lesion. Adrenals/Urinary Tract: Normal adrenal glands. The right kidney is unremarkable. Small stone within upper pole of left kidney measures 3 mm, image 68/2. Several small cysts are noted which measure less than 1 cm and are technically too small to reliably characterize. No hydronephrosis. Urinary bladder is unremarkable. Stomach/Bowel: The stomach appears normal. No bowel wall thickening, inflammation, or distension. Colonic neoplasm at the level of the rectum is identified measuring approximately 4.8 cm in length and 3.3 cm in diameter, image 103/6. Vascular/Lymphatic: Normal abdominal aorta. No adenopathy identified within the upper abdomen. No pelvic or inguinal adenopathy. A few tiny perirectal lymph nodes are noted within the lower pelvis which measure up to 4 mm. Reproductive: Prostate is unremarkable. Other: No free  fluid or fluid collections. No signs of peritoneal disease. Musculoskeletal: No acute or significant osseous findings. IMPRESSION: 1. Colonic neoplasm at the level of the rectum is identified measuring approximately 4.8 cm in length and 3.3 cm in diameter. 2. A few tiny perirectal lymph nodes are noted within the lower pelvis which measure up to 4 mm. 3. No findings to suggest solid organ metastasis or nodal metastasis within the chest, abdomen or pelvis. 4. Splenomegaly. 5. Nonobstructing left renal calculus. 6. Tiny nodule in the superior segment of left lower lobe measures 3 mm. This was present on CT from 04/24/2019 and is unchanged compatible with a benign nodule. Electronically Signed   By: Kerby Moors M.D.   On: 03/14/2021 17:08       IMPRESSION/PLAN: 1. Stage IIA cT3bN0, adenocarcinoma of the rectum. Dr. Lisbeth Renshaw discusses the pathology findings and reviews the nature of locally advanced rectal  disease. Dr. Lisbeth Renshaw discusses the rationale for  chemoRT, followed by surgical resection, and possible additional systemic therapy. We discussed the risks, benefits, short, and long term effects of radiotherapy, as well as the curative intent, and the patient is interested in proceeding. Dr. Lisbeth Renshaw discusses the delivery and logistics of radiotherapy and anticipates a course of 5 1/2 weeks of radiotherapy. Written consent is obtained and placed in the chart, a copy was provided to the patient. He will simulate tomorrow so we can start chemoRT on 04/03/21.  In a visit lasting 60 minutes, greater than 50% of the time was spent face to face discussing the patient's condition, in preparation for the discussion, and coordinating the patient's care.   The above documentation reflects my direct findings during this shared patient visit. Please see the separate note by Dr. Lisbeth Renshaw on this date for the remainder of the patient's plan of care.    Carola Rhine, [facility]   **Disclaimer: This note was dictated with voice  recognition  software. Similar sounding words can inadvertently be transcribed and this note may contain transcription errors which may not have been corrected upon publication of note.**

## 2021-03-21 NOTE — Progress Notes (Signed)
GI Location of Tumor / Histology: Rectum, biopsy, mass - ADENOCARCINOMA - SEE COMMENT Microscopic Comment Based on the biopsy, the adenocarcinoma appears well differentiated. Dr. Martinique reviewed the case and agrees with above diagnosis. Dr. Rush Landmark was notified of these results on March 06, 2021. Tissue available for ancillary studies upon request. FINDINGS: TUMOR LOCATION   Tumor distance from Anal Verge/Skin Surface: 13 cm on 25/2.   Tumor distance to Internal Anal Sphincter: 2.2 cm on 23/4   TUMOR DESCRIPTION   Circumferential Extent: Approximately 180 degrees, centered about the 4 o'clock position on 24/6. Measures 4.7 x 2.3 cm on 13/3.   Tumor Length: 3.9 cm on 23/5.   T - CATEGORY   Extension through Muscularis Propria: Present. Measures maximally 4 mm on 24/6. T3b   Shortest Distance of any tumor/node from Mesorectal Fascia: 8 mm on 24/6.   Extramural Vascular Invasion/Tumor Thrombus: Absent   Invasion of Anterior Peritoneal Reflection: Absent   Involvement of Adjacent Organs or Pelvic Sidewall: No   Levator Ani Involvement: No   N - CATEGORY   Mesorectal Lymph Nodes >=17mm: None. There is a left-sided node at the 3 o'clock position of 4 mm on 24/6. Not pathologic by size criteria.   Extra-mesorectal Lymphadenopathy: Absent   Other: No significant free fluid. Normal prostate. Tiny bilateral fat containing inguinal hernias. No bowel obstruction. Normal urinary bladder.   IMPRESSION: Rectal adenocarcinoma T stage: T3b   Rectal adenocarcinoma N stage:  0   Distance from tumor to the internal anal sphincter is 2.2 cm. Steven [name] presented  months ago with symptoms of:Indications:              Screening for colorectal malignant neoplasm, This                           is the patient's first colonoscopy, Incidental -                           Rectal bleeding, Incidental - Abnormal CT of the GI                           tract with concern of abnormality  in rectum  Biopsies of rectum(if applicable) revealed: adenocarcinoma  Past/Anticipated interventions by surgeon, if any:   Past/Anticipated interventions by medical oncology, if any:   Weight changes, if any: none  Bowel/Bladder complaints, if any: both constipation and diarrhea  Nausea / Vomiting, if any: some nausea  Pain issues, if any:  no  Any blood per rectum:   none  SAFETY ISSUES: Prior radiation? no Pacemaker/ICD? no Possible current pregnancy? no Is the patient on methotrexate? no  Current Complaints/Details: Vitals:   03/21/21 1333  BP: 134/87  Pulse: 83  Resp: 18  Temp: (!) 97.2 F (36.2 C)  SpO2: 100%  Weight: 98 kg  Height: 5\' 9"  (1.753 m)

## 2021-03-21 NOTE — Progress Notes (Signed)
START ON PATHWAY REGIMEN - Colorectal     A cycle is every 7 days:     Capecitabine   **Always confirm dose/schedule in your pharmacy ordering system**  Patient Characteristics: Preoperative or Nonsurgical Candidate (Clinical Staging), Rectal, cT3 - cT4, cN0 or Any cT, cN+ Tumor Location: Rectal Therapeutic Status: Preoperative or Nonsurgical Candidate (Clinical Staging) AJCC T Category: cT3 AJCC N Category: cN0 AJCC M Category: cM0 AJCC 8 Stage Grouping: IIA Intent of Therapy: Curative Intent, Discussed with Patient

## 2021-03-22 ENCOUNTER — Telehealth: Payer: Self-pay | Admitting: Hematology and Oncology

## 2021-03-22 ENCOUNTER — Ambulatory Visit
Admission: RE | Admit: 2021-03-22 | Discharge: 2021-03-22 | Disposition: A | Discharge status: Home / Self Care | Payer: BC Managed Care – PPO | Source: Ambulatory Visit | Attending: Radiation Oncology | Admitting: Radiation Oncology

## 2021-03-22 DIAGNOSIS — Z510 Encounter for antineoplastic radiation therapy: Secondary | ICD-10-CM | POA: Insufficient documentation

## 2021-03-22 DIAGNOSIS — C20 Malignant neoplasm of rectum: Secondary | ICD-10-CM | POA: Diagnosis not present

## 2021-03-22 LAB — CEA (IN HOUSE-CHCC): CEA (CHCC-In House): 10.16 ng/mL — ABNORMAL HIGH (ref 0.00–5.00)

## 2021-03-22 NOTE — Telephone Encounter (Signed)
Scheduled appt per 6/24 sch msg. Called pt, no answer.Left msg with appt date and time.

## 2021-03-26 ENCOUNTER — Encounter: Payer: Self-pay | Admitting: Hematology and Oncology

## 2021-03-26 ENCOUNTER — Encounter: Payer: Self-pay | Admitting: Radiation Oncology

## 2021-03-27 ENCOUNTER — Other Ambulatory Visit (HOSPITAL_COMMUNITY): Payer: Self-pay

## 2021-03-27 ENCOUNTER — Encounter: Payer: Self-pay | Admitting: Hematology and Oncology

## 2021-03-27 ENCOUNTER — Telehealth: Payer: Self-pay

## 2021-03-27 ENCOUNTER — Telehealth: Payer: Self-pay | Admitting: Pharmacist

## 2021-03-27 DIAGNOSIS — C20 Malignant neoplasm of rectum: Secondary | ICD-10-CM

## 2021-03-27 MED ORDER — CAPECITABINE 500 MG PO TABS: ORAL_TABLET | ORAL | 0 refills | Status: DC

## 2021-03-27 NOTE — Telephone Encounter (Signed)
Oral Oncology Patient Advocate Encounter  Prior Authorization for Xeloda has been approved.    PA# [id number] Effective dates: 02/25/21 through 03/27/22  Patient must fill through [place].  Oral Oncology Clinic will continue to follow.   [place] Patient [place] Phone [phone] Fax [phone] 03/27/2021 11:24 AM

## 2021-03-27 NOTE — Telephone Encounter (Signed)
Oral Oncology Patient Advocate Encounter   Received notification from [employer] that prior authorization for Xeloda is required.   PA submitted on CoverMyMeds Key [id number] Status is pending   Oral Oncology Clinic will continue to follow.  Steven [name] Phone [phone] Fax [phone] 03/27/2021 11:11 AM

## 2021-03-27 NOTE — Telephone Encounter (Signed)
Oral Oncology Pharmacist Encounter  Notified today of new prescription for Xeloda (capecitabine) sent to [facility] on 03/21/21 for the treatment of stage IIA rectal cancer in conjunction with radiation, planned duration 5 1/2 weeks.  Prescription dose and frequency assessed for appropriateness. Appropriate for therapy initiation.   CBC w/ Diff and CMP from 03/21/21 assessed, no baseline dose adjustments required.  Current medication list in Epic reviewed, no relevant/significant DDIs with Xeloda identified.  Evaluated chart and no patient barriers to medication adherence noted.   Patient agreement for treatment documented in MD note on 03/21/21.  Due to patient's insurance requirements, prescription must be filled through [facility]. Prescription redirected for dispensing.   Oral Oncology Clinic will continue to follow for insurance authorization, copayment issues, initial counseling and start date.  Leron Croak, PharmD, BCPS Hematology/Oncology Clinical Pharmacist [facility] [phone] 03/27/2021 11:36 AM

## 2021-03-28 LAB — JAK2 (INCLUDING V617F AND EXON 12), MPL,& CALR W/RFL MPN PANEL (NGS)

## 2021-03-28 NOTE — Telephone Encounter (Signed)
Oral Chemotherapy Pharmacist Encounter   [place] to check on status of Xeloda (capecitabine). Informed they are still processing prescription for patient and will reach out to patient once ready to set up shipment. Request made that this be changed to expedited status for patient since he is scheduled to start therapy next week.   Leron Croak, PharmD, BCPS Hematology/Oncology Clinical Pharmacist [facility] [phone] 03/28/2021 3:16 PM

## 2021-03-29 ENCOUNTER — Other Ambulatory Visit: Payer: Self-pay

## 2021-03-29 ENCOUNTER — Inpatient Hospital Stay: Payer: BC Managed Care – PPO | Attending: Hematology and Oncology

## 2021-03-29 DIAGNOSIS — Z86711 Personal history of pulmonary embolism: Secondary | ICD-10-CM | POA: Insufficient documentation

## 2021-03-29 DIAGNOSIS — Z79899 Other long term (current) drug therapy: Secondary | ICD-10-CM | POA: Insufficient documentation

## 2021-03-29 DIAGNOSIS — E119 Type 2 diabetes mellitus without complications: Secondary | ICD-10-CM | POA: Insufficient documentation

## 2021-03-29 DIAGNOSIS — C20 Malignant neoplasm of rectum: Secondary | ICD-10-CM | POA: Insufficient documentation

## 2021-03-29 DIAGNOSIS — I10 Essential (primary) hypertension: Secondary | ICD-10-CM | POA: Insufficient documentation

## 2021-03-29 NOTE — Telephone Encounter (Signed)
Oral Chemotherapy Pharmacist Encounter  I spoke with patient for overview of: Xeloda for the treatment of stage IIA rectal cancer in conjunction with radiation, planned duration 5 1/2 weeks.   Counseled patient on administration, dosing, side effects, monitoring, drug-food interactions, safe handling, storage, and disposal.  Patient will take Xeloda 500mg  tablets, 4 tablets (2000mg ) by mouth in AM and 3 tabs (1500mg ) by mouth in PM, within 30 minutes of finishing meals, on days of radiation only.  Xeloda and radiation start date: 04/03/21  Adverse effects of Xeloda include but are not limited to: fatigue, decreased blood counts, GI upset, diarrhea, and hand-foot syndrome.  Patient will obtain anti diarrheal and alert the office of 4 or more loose stools above baseline.   Reviewed with patient importance of keeping a medication schedule and plan for any missed doses. No barriers to medication adherence identified.  Medication reconciliation performed and medication/allergy list updated.  Insurance authorization for Xeloda has been obtained. Patient is required to fill Xeloda through [facility]. Medication will be delivered to patient's home 03/30/21 per patient.   All questions answered.  [name] voiced understanding and appreciation.   Medication education handout given to patient. Patient knows to call the office with questions or concerns. Oral Chemotherapy Clinic phone number provided to patient.   Leron Croak, PharmD, BCPS Hematology/Oncology Clinical Pharmacist [facility] [phone] 03/29/2021 12:12 PM

## 2021-03-31 ENCOUNTER — Other Ambulatory Visit: Payer: Self-pay | Admitting: Hematology and Oncology

## 2021-03-31 DIAGNOSIS — C20 Malignant neoplasm of rectum: Secondary | ICD-10-CM

## 2021-04-03 ENCOUNTER — Inpatient Hospital Stay (HOSPITAL_BASED_OUTPATIENT_CLINIC_OR_DEPARTMENT_OTHER): Payer: BC Managed Care – PPO | Admitting: Hematology and Oncology

## 2021-04-03 ENCOUNTER — Ambulatory Visit
Admission: RE | Admit: 2021-04-03 | Discharge: 2021-04-03 | Disposition: A | Discharge status: Home / Self Care | Payer: BC Managed Care – PPO | Source: Ambulatory Visit | Attending: Radiation Oncology | Admitting: Radiation Oncology

## 2021-04-03 ENCOUNTER — Inpatient Hospital Stay: Payer: BC Managed Care – PPO

## 2021-04-03 ENCOUNTER — Other Ambulatory Visit: Payer: Self-pay

## 2021-04-03 ENCOUNTER — Encounter: Payer: Self-pay | Admitting: Hematology and Oncology

## 2021-04-03 VITALS — BP 137/86 | HR 60 | Temp 98.0°F | Resp 18 | Ht 69.0 in | Wt 219.1 lb

## 2021-04-03 DIAGNOSIS — Z79899 Other long term (current) drug therapy: Secondary | ICD-10-CM | POA: Diagnosis not present

## 2021-04-03 DIAGNOSIS — T451X5A Adverse effect of antineoplastic and immunosuppressive drugs, initial encounter: Secondary | ICD-10-CM

## 2021-04-03 DIAGNOSIS — C20 Malignant neoplasm of rectum: Secondary | ICD-10-CM | POA: Insufficient documentation

## 2021-04-03 DIAGNOSIS — R110 Nausea: Secondary | ICD-10-CM

## 2021-04-03 DIAGNOSIS — Z86711 Personal history of pulmonary embolism: Secondary | ICD-10-CM | POA: Diagnosis not present

## 2021-04-03 DIAGNOSIS — E119 Type 2 diabetes mellitus without complications: Secondary | ICD-10-CM | POA: Diagnosis not present

## 2021-04-03 DIAGNOSIS — Z510 Encounter for antineoplastic radiation therapy: Secondary | ICD-10-CM | POA: Diagnosis not present

## 2021-04-03 DIAGNOSIS — I10 Essential (primary) hypertension: Secondary | ICD-10-CM | POA: Diagnosis not present

## 2021-04-03 LAB — BASIC METABOLIC PANEL - CANCER CENTER ONLY
Anion gap: 10 (ref 5–15)
BUN: 19 mg/dL (ref 6–20)
CO2: 24 mmol/L (ref 22–32)
Calcium: 9.7 mg/dL (ref 8.9–10.3)
Chloride: 106 mmol/L (ref 98–111)
Creatinine: 1.08 mg/dL (ref 0.61–1.24)
GFR, Estimated: >60 mL/min (ref >60)
Glucose, Bld: 189 mg/dL — ABNORMAL HIGH (ref 70–99)
Potassium: 4.7 mmol/L (ref 3.5–5.1)
Sodium: 140 mmol/L (ref 135–145)

## 2021-04-03 LAB — CBC WITH DIFFERENTIAL/PLATELET
Abs Immature Granulocytes: 0.01 10*3/uL (ref 0.00–0.07)
Basophils Absolute: 0.1 10*3/uL (ref 0.0–0.1)
Basophils Relative: 1 %
Eosinophils Absolute: 0.3 10*3/uL (ref 0.0–0.5)
Eosinophils Relative: 6 %
HCT: 43.4 % (ref 39.0–52.0)
Hemoglobin: 14 g/dL (ref 13.0–17.0)
Immature Granulocytes: 0 %
Lymphocytes Relative: 41 %
Lymphs Abs: 2.4 10*3/uL (ref 0.7–4.0)
MCH: 24.9 pg — ABNORMAL LOW (ref 26.0–34.0)
MCHC: 32.3 g/dL (ref 30.0–36.0)
MCV: 77.2 fL — ABNORMAL LOW (ref 80.0–100.0)
Monocytes Absolute: 0.3 10*3/uL (ref 0.1–1.0)
Monocytes Relative: 5 %
Neutro Abs: 2.7 10*3/uL (ref 1.7–7.7)
Neutrophils Relative %: 47 %
Platelets: 214 10*3/uL (ref 150–400)
RBC: 5.62 MIL/uL (ref 4.22–5.81)
RDW: 15.4 % (ref 11.5–15.5)
WBC: 5.7 10*3/uL (ref 4.0–10.5)
nRBC: 0 % (ref 0.0–0.2)

## 2021-04-03 LAB — CEA (IN HOUSE-CHCC): CEA (CHCC-In House): 9.69 ng/mL — ABNORMAL HIGH (ref 0.00–5.00)

## 2021-04-03 NOTE — Assessment & Plan Note (Signed)
Mild nausea after chemotherapy.  Can use as needed Zofran and Compazine.

## 2021-04-03 NOTE — Progress Notes (Signed)
[name] CONSULT NOTE  Patient Care Team: Shirline Frees, MD as PCP - General (Family Medicine) [name], MD as Consulting Physician (Hematology and Oncology) Jonnie Finner, RN (Inactive) as Oncology Nurse Navigator  CHIEF COMPLAINTS/PURPOSE OF CONSULTATION:  New diagnosis of adenocarcinoma rectum,  ASSESSMENT & PLAN:   Adenocarcinoma of rectum [facility]) This is a very pleasant 50 year old male patient with newly diagnosed T3BN0 adenocarcinoma of the rectum, MSS with no evidence of distant metastasis following up with medical oncology for consideration of concurrent chemoradiation.  [name] is here for a follow-up with his wife.  Today we have discussed about his clinical staging which is T3N0 or stage IIa adenocarcinoma of the rectum and treatment recommendations from TB suggested concurrent chemotherapy radiation with Xeloda versus 5-fluorouracil followed by consideration for surgery and adjuvant chemotherapy.  Given concern for 4 mm perirectal lymph node which is not entirely diagnostic for involvement, we have also discussed total neoadjuvant approach which patient was reluctant given some issues with his work and disability leave.  He would like to proceed with chemoradiation followed by surgery and adjuvant chemotherapy as discussed.  He is currently on Xeloda day 1 04/03/2021, first day of radiation scheduled today.  Review of systems with mild nausea and one episode of diarrhea after Xeloda.  Physical examination otherwise unremarkable. We will proceed with CBC and BMP today.  His last labs 2 weeks ago showed normal creatinine. He will return to clinic in 2 weeks for follow-up.  He will continue Xeloda as instructed in the interim.   Chemotherapy-induced nausea Mild nausea after chemotherapy.  Can use as needed Zofran and Compazine.  Orders Placed This Encounter  Procedures   CBC with Differential/Platelet    Standing Status:   Standing    Number of  Occurrences:   22    Standing Expiration Date:   5/0/2774   Basic Metabolic Panel - [place] Only    Standing Status:   Future    Standing Expiration Date:   04/03/2022    [place] CONSULT NOTE  Patient Care Team: Shirline Frees, MD as PCP - General (Family Medicine) [name], MD as Consulting Physician (Hematology and Oncology) Jonnie Finner, RN (Inactive) as Oncology Nurse Navigator  CHIEF COMPLAINTS/PURPOSE OF CONSULTATION:  New diagnosis of adenocarcinoma rectum,  ASSESSMENT & PLAN:  Adenocarcinoma of rectum [facility]) This is a very pleasant 50 year old male patient with newly diagnosed T3BN0 adenocarcinoma of the rectum, MSS with no evidence of distant metastasis following up with medical oncology for consideration of concurrent chemoradiation.  [name] is here for a follow-up with his wife.  Today we have discussed about his clinical staging which is T3N0 or stage IIa adenocarcinoma of the rectum and treatment recommendations from TB suggested concurrent chemotherapy radiation with Xeloda versus 5-fluorouracil followed by consideration for surgery and adjuvant chemotherapy.  Given concern for 4 mm perirectal lymph node which is not entirely diagnostic for involvement, we have also discussed total neoadjuvant approach which patient was reluctant given some issues with his work and disability leave.  He would like to proceed with chemoradiation followed by surgery and adjuvant chemotherapy as discussed.  He is currently on Xeloda day 1 04/03/2021, first day of radiation scheduled today.  Review of systems with mild nausea and one episode of diarrhea after Xeloda.  Physical examination otherwise unremarkable. We will proceed with CBC and BMP today.  His last labs 2 weeks ago showed normal creatinine. He will return to clinic  in 2 weeks for follow-up.  He will continue Xeloda as instructed in the interim.   Chemotherapy-induced nausea Mild nausea after  chemotherapy.  Can use as needed Zofran and Compazine.  Addendum: I discussed the case with Ebony Hail from Dr [facility] office and we were a little concerned about the small LN which is measuring 4 mm. I called the patient and also discussed about total neoadjuvant approach given these concerns, discussed that in this approach, he would do the chemo first, followed by CRT and then surgery. This approach can sometimes give [place] a better chance for sphincter preservation and also could be easier on the patient since chemo is done before surgery. He however thinks the current plan works better for his short term disability purposes, and he would like to keep it the same. He understands that adjuvant chemo will be needed if we proceed with concurrent CRT.   Orders Placed This Encounter  Procedures   CBC with Differential/Platelet    Standing Status:   Standing    Number of Occurrences:   22    Standing Expiration Date:   05/07/2118   Basic Metabolic Panel - [place] Only    Standing Status:   Future    Standing Expiration Date:   04/03/2022     HISTORY OF PRESENTING ILLNESS:  Steven [name] 50 y.o. male is here because of new diagnosis of rectal cancer  This is a well-known patient to me who was previously seen for unprovoked bilateral pulmonary embolism.  During evaluation of his unprovoked PE, we did hypercoagulable work-up and we also requested imaging to rule out occult malignancy.  This CT abdomen identified asymmetric soft tissue changes along the left of the rectum and hence I recommended a colonoscopy referral was placed to Dr. Donneta Romberg office.  He had colonoscopy in June which showed a palpable rectal mass on digital exam likely malignant appearing in the rectum identified beginning 8 cm from the anal verge extending to 13 cm above the anal verge partially circumferential and oozing was present.  He had pathology which showed well-differentiated adenocarcinoma.  Staging CT scans failed to  show any evidence of metastatic disease.  MRI pelvis stage T3bN0.    Interval history He started on Xeloda, first dose this morning.  About an hour later he had an episode of nausea and some diarrhea.  He is scheduled for his first radiation treatment as well today.  Besides the above-mentioned symptoms, he is doing quite well.  He has regular bowel habits and describes tenesmus.  Rest of the pertinent 10 point ROS reviewed and negative.  MEDICAL HISTORY:  Past Medical History:  Diagnosis Date   Anxiety    Asthma    Depression    Diabetes mellitus without complication (HCC)    HLD (hyperlipidemia)    Hypertension    Kidney stones    OSA (obstructive sleep apnea)    CPAP    SURGICAL HISTORY: Past Surgical History:  Procedure Laterality Date   APPENDECTOMY     CYST EXCISION     left neck   WISDOM TOOTH EXTRACTION      SOCIAL HISTORY: Social History   Socioeconomic History   Marital status: Married    Spouse name: Not on file   Number of children: 3   Years of education: Not on file   Highest education level: Not on file  Occupational History   Occupation: Gaffer  Tobacco Use   Smoking status: Never   Smokeless tobacco: Never  Vaping Use   Vaping Use: Never used  Substance and Sexual Activity   Alcohol use: Yes    Comment: 1 beer every 3-4 months   Drug use: Never   Sexual activity: Not on file  Other Topics Concern   Not on file  Social History Narrative   Not on file   Social Determinants of Health   Financial Resource Strain: Not on file  Food Insecurity: Not on file  Transportation Needs: Not on file  Physical Activity: Not on file  Stress: Not on file  Social Connections: Not on file  Intimate Partner Violence: Not on file    FAMILY HISTORY: Family History  Problem Relation Age of Onset   Diabetes Mellitus II Mother    Breast cancer Mother 32   Thrombophlebitis Father    Breast cancer Maternal Grandmother 70   Diabetes Maternal  Grandmother    Breast cancer Maternal Aunt 60   Diabetes Maternal Aunt    Hypertension Paternal Grandmother    Colon cancer Neg Hx    Esophageal cancer Neg Hx    Inflammatory bowel disease Neg Hx    Liver disease Neg Hx    Pancreatic cancer Neg Hx    Rectal cancer Neg Hx    Stomach cancer Neg Hx     ALLERGIES:  is allergic to contrast media [iodinated diagnostic agents].  MEDICATIONS:  Current Outpatient Medications  Medication Sig Dispense Refill   acetaminophen (TYLENOL) 500 MG tablet Take 1,000 mg by mouth every 6 (six) hours as needed for mild pain.     albuterol (VENTOLIN HFA) 108 (90 Base) MCG/ACT inhaler Inhale 2 puffs into the lungs every 4 (four) hours as needed for wheezing.     ALPRAZolam (XANAX) 0.25 MG tablet Take 0.25 mg by mouth daily as needed for anxiety or sleep.     blood glucose meter kit and supplies KIT Dispense based on patient and insurance preference. Use up to four times daily as directed. (FOR ICD-9 250.00, 250.01). 1 each 0   capecitabine (XELODA) 500 MG tablet Take Monday through Friday during radiation. Take 4 tablets in AM and 3 tablets in PM. Taken within 30 minutes after meals. 150 tablet 0   Cinnamon 500 MG capsule See admin instructions. 1000 mg daily     fenofibrate 160 MG tablet Take 160 mg by mouth daily.     Ferrous Sulfate (IRON) 325 (65 Fe) MG TABS Take 325 mg by mouth daily.     irbesartan (AVAPRO) 150 MG tablet Take 150 mg by mouth daily.     meloxicam (MOBIC) 15 MG tablet Take 15 mg by mouth at bedtime.      ondansetron (ZOFRAN) 8 MG tablet Take 1 tablet (8 mg total) by mouth 2 (two) times daily as needed (Nausea or vomiting). 30 tablet 1   polycarbophil (FIBERCON) 625 MG tablet Take 625 mg by mouth daily. 2 tabs per day     polycarbophil (FIBERCON) 625 MG tablet Take 625 mg by mouth in the morning and at bedtime.     prochlorperazine (COMPAZINE) 10 MG tablet Take 1 tablet (10 mg total) by mouth every 6 (six) hours as needed (Nausea or  vomiting). 30 tablet 1   tetrahydrozoline-zinc (VISINE-AC) 0.05-0.25 % ophthalmic solution Place 2 drops into both eyes as needed (allergy).     XARELTO 20 MG TABS tablet Take 20 mg by mouth daily.     XIGDUO XR 01-999 MG TB24 Take 2 tablets by mouth daily.  predniSONE (DELTASONE) 50 MG tablet Take one 50 mg tablet by mouth 13 hours prior to scan, one 50 mg tablet by mouth 7 hours prior to scan and one 50 mg tablet 1 hour before scan. 3 tablet 0   No current facility-administered medications for this visit.     PHYSICAL EXAMINATION:  ECOG PERFORMANCE STATUS: 0 - Asymptomatic  Vitals:   04/03/21 0934  BP: 137/86  Pulse: 60  Resp: 18  Temp: 98 F (36.7 C)  SpO2: 100%   Filed Weights   04/03/21 0934  Weight: 219 lb 1.6 oz (99.4 kg)    GENERAL:alert, no distress and comfortable, obese SKIN: skin color, texture, turgor are normal, no rashes or significant lesions EYES: normal, conjunctiva are pink and non-injected, sclera clear OROPHARYNX:no exudate, no erythema and lips, buccal mucosa, and tongue normal  NECK: supple, thyroid normal size, non-tender, without nodularity LYMPH:  no palpable lymphadenopathy in the cervical, axillary or inguinal LUNGS: clear to auscultation and percussion with normal breathing effort HEART: regular rate & rhythm and no murmurs and no lower extremity edema ABDOMEN:abdomen soft, non-tender and normal bowel sounds Musculoskeletal:no cyanosis of digits and no clubbing  PSYCH: alert & oriented x 3 with fluent speech NEURO: no focal motor/sensory deficits  LABORATORY DATA:  I have reviewed the data as listed Lab Results  Component Value Date   WBC 5.9 03/21/2021   HGB 14.6 03/21/2021   HCT 44.6 03/21/2021   MCV 76.9 (L) 03/21/2021   PLT 204 03/21/2021     Chemistry      Component Value Date/Time   NA 138 03/21/2021 1520   K 3.7 03/21/2021 1520   CL 102 03/21/2021 1520   CO2 23 03/21/2021 1520   BUN 18 03/21/2021 1520   CREATININE 1.03  03/21/2021 1520      Component Value Date/Time   CALCIUM 9.7 03/21/2021 1520   ALKPHOS 56 03/21/2021 1520   AST 20 03/21/2021 1520   ALT 27 03/21/2021 1520   BILITOT 1.1 03/21/2021 1520       RADIOGRAPHIC STUDIES: I have personally reviewed the radiological images as listed and agreed with the findings in the report. MR PELVIS WO CONTRAST  Result Date: 03/15/2021 CLINICAL DATA:  New diagnosis of rectal cancer. EXAM: MRI PELVIS WITHOUT CONTRAST TECHNIQUE: Multiplanar multisequence MR imaging of the pelvis was performed. No intravenous contrast was administered. Ultrasound gel was administered per rectum to optimize tumor evaluation. COMPARISON:  CTs, including 03/14/2021. The report of the colonoscopy of 03/05/2021 is also reviewed. FINDINGS: TUMOR LOCATION Tumor distance from Anal Verge/Skin Surface: 13 cm on 25/2. Tumor distance to Internal Anal Sphincter: 2.2 cm on 23/4 TUMOR DESCRIPTION Circumferential Extent: Approximately 180 degrees, centered about the 4 o'clock position on 24/6. Measures 4.7 x 2.3 cm on 13/3. Tumor Length: 3.9 cm on 23/5. T - CATEGORY Extension through Muscularis Propria: Present. Measures maximally 4 mm on 24/6. T3b Shortest Distance of any tumor/node from Mesorectal Fascia: 8 mm on 24/6. Extramural Vascular Invasion/Tumor Thrombus: Absent Invasion of Anterior Peritoneal Reflection: Absent Involvement of Adjacent Organs or Pelvic Sidewall: No Levator Ani Involvement: No N - CATEGORY Mesorectal Lymph Nodes >=52m: None. There is a left-sided node at the 3 o'clock position of 4 mm on 24/6. Not pathologic by size criteria. Extra-mesorectal Lymphadenopathy: Absent Other: No significant free fluid. Normal prostate. Tiny bilateral fat containing inguinal hernias. No bowel obstruction. Normal urinary bladder. IMPRESSION: Rectal adenocarcinoma T stage: T3b Rectal adenocarcinoma N stage:  0 Distance from tumor to the  internal anal sphincter is 2.2 cm. Electronically Signed   By: Abigail Miyamoto M.D.   On: 03/15/2021 14:27   CT CHEST ABDOMEN PELVIS W CONTRAST  Result Date: 03/14/2021 CLINICAL DATA:  Colorectal carcinoma staging. EXAM: CT CHEST, ABDOMEN, AND PELVIS WITH CONTRAST TECHNIQUE: Multidetector CT imaging of the chest, abdomen and pelvis was performed following the standard protocol during bolus administration of intravenous contrast. CONTRAST:  135m OMNIPAQUE IOHEXOL 300 MG/ML  SOLN COMPARISON:  CT AP 11/23/2020 and CT angio chest 07/05/2020. FINDINGS: CT CHEST FINDINGS Cardiovascular: No significant vascular findings. Normal heart size. No pericardial effusion. Mediastinum/Nodes: No enlarged mediastinal, hilar, or axillary lymph nodes. Thyroid gland, trachea, and esophagus demonstrate no significant findings. Lungs/Pleura: Tiny nodule in the superior segment of left lower lobe measures 3 mm, image 35/4. Unchanged from 04/24/2019. No new suspicious lung nodules identified. Musculoskeletal: No chest wall mass or suspicious bone lesions identified. CT ABDOMEN PELVIS FINDINGS Hepatobiliary: No focal liver abnormality is seen. No gallstones, gallbladder wall thickening, or biliary dilatation. Pancreas: Unremarkable. No pancreatic ductal dilatation or surrounding inflammatory changes. Spleen: The spleen measures 14.6 by 10.8 by 5.3 cm (volume = 440 cm^3) no focal splenic lesion. Adrenals/Urinary Tract: Normal adrenal glands. The right kidney is unremarkable. Small stone within upper pole of left kidney measures 3 mm, image 68/2. Several small cysts are noted which measure less than 1 cm and are technically too small to reliably characterize. No hydronephrosis. Urinary bladder is unremarkable. Stomach/Bowel: The stomach appears normal. No bowel wall thickening, inflammation, or distension. Colonic neoplasm at the level of the rectum is identified measuring approximately 4.8 cm in length and 3.3 cm in diameter, image 103/6. Vascular/Lymphatic: Normal abdominal aorta. No adenopathy identified  within the upper abdomen. No pelvic or inguinal adenopathy. A few tiny perirectal lymph nodes are noted within the lower pelvis which measure up to 4 mm. Reproductive: Prostate is unremarkable. Other: No free fluid or fluid collections. No signs of peritoneal disease. Musculoskeletal: No acute or significant osseous findings. IMPRESSION: 1. Colonic neoplasm at the level of the rectum is identified measuring approximately 4.8 cm in length and 3.3 cm in diameter. 2. A few tiny perirectal lymph nodes are noted within the lower pelvis which measure up to 4 mm. 3. No findings to suggest solid organ metastasis or nodal metastasis within the chest, abdomen or pelvis. 4. Splenomegaly. 5. Nonobstructing left renal calculus. 6. Tiny nodule in the superior segment of left lower lobe measures 3 mm. This was present on CT from 04/24/2019 and is unchanged compatible with a benign nodule. Electronically Signed   By: TKerby MoorsM.D.   On: 03/14/2021 17:08    All questions were answered. The patient knows to call the clinic with any problems, questions or concerns.   We have reviewed recent imaging, pathology report, treatment recommendations, adverse effects of chemotherapy in detail today.    PBenay Pike MD 04/03/2021 10:12 AM

## 2021-04-03 NOTE — Assessment & Plan Note (Signed)
This is a very pleasant 50 year old male patient with newly diagnosed T3BN0 adenocarcinoma of the rectum, MSS with no evidence of distant metastasis following up with medical oncology for consideration of concurrent chemoradiation.  Steven [name] is here for a follow-up with his wife.  Today we have discussed about his clinical staging which is T3N0 or stage IIa adenocarcinoma of the rectum and treatment recommendations from TB suggested concurrent chemotherapy radiation with Xeloda versus 5-fluorouracil followed by consideration for surgery and adjuvant chemotherapy.  Given concern for 4 mm perirectal lymph node which is not entirely diagnostic for involvement, we have also discussed total neoadjuvant approach which patient was reluctant given some issues with his work and disability leave.  He would like to proceed with chemoradiation followed by surgery and adjuvant chemotherapy as discussed.  He is currently on Xeloda day 1 04/03/2021, first day of radiation scheduled today.  Review of systems with mild nausea and one episode of diarrhea after Xeloda.  Physical examination otherwise unremarkable. We will proceed with CBC and BMP today.  His last labs 2 weeks ago showed normal creatinine. He will return to clinic in 2 weeks for follow-up.  He will continue Xeloda as instructed in the interim.

## 2021-04-04 ENCOUNTER — Encounter: Payer: BC Managed Care – PPO | Admitting: Genetic Counselor

## 2021-04-04 ENCOUNTER — Ambulatory Visit
Admission: RE | Admit: 2021-04-04 | Discharge: 2021-04-04 | Disposition: A | Discharge status: Home / Self Care | Payer: BC Managed Care – PPO | Source: Ambulatory Visit | Attending: Radiation Oncology | Admitting: Radiation Oncology

## 2021-04-04 DIAGNOSIS — C20 Malignant neoplasm of rectum: Secondary | ICD-10-CM | POA: Diagnosis not present

## 2021-04-04 DIAGNOSIS — Z510 Encounter for antineoplastic radiation therapy: Secondary | ICD-10-CM | POA: Diagnosis not present

## 2021-04-05 ENCOUNTER — Ambulatory Visit
Admission: RE | Admit: 2021-04-05 | Discharge: 2021-04-05 | Disposition: A | Discharge status: Home / Self Care | Payer: BC Managed Care – PPO | Source: Ambulatory Visit | Attending: Radiation Oncology | Admitting: Radiation Oncology

## 2021-04-05 ENCOUNTER — Other Ambulatory Visit: Payer: Self-pay

## 2021-04-05 DIAGNOSIS — Z510 Encounter for antineoplastic radiation therapy: Secondary | ICD-10-CM | POA: Diagnosis not present

## 2021-04-05 DIAGNOSIS — C20 Malignant neoplasm of rectum: Secondary | ICD-10-CM | POA: Diagnosis not present

## 2021-04-05 NOTE — Progress Notes (Signed)
Pt here for patient teaching.  Pt given Radiation and You booklet.  Reviewed areas of pertinence such as diarrhea, fatigue, hair loss, sexual and fertility changes, skin changes, and urinary and bladder changes . Pt able to give teach back of to pat skin, use unscented/gentle soap, use baby wipes, have Imodium on hand, drink plenty of water, and sitz bath,avoid applying anything to skin within 4 hours of treatment. Pt verbalizes understanding of information given and will contact nursing with any questions or concerns.     [email or web address]/[email or web address]   Gloriajean Dell. Leonie Green, BSN

## 2021-04-08 ENCOUNTER — Ambulatory Visit
Admission: RE | Admit: 2021-04-08 | Discharge: 2021-04-08 | Disposition: A | Discharge status: Home / Self Care | Payer: BC Managed Care – PPO | Source: Ambulatory Visit | Attending: Radiation Oncology | Admitting: Radiation Oncology

## 2021-04-08 ENCOUNTER — Other Ambulatory Visit: Payer: Self-pay

## 2021-04-08 DIAGNOSIS — C20 Malignant neoplasm of rectum: Secondary | ICD-10-CM | POA: Diagnosis not present

## 2021-04-08 DIAGNOSIS — Z510 Encounter for antineoplastic radiation therapy: Secondary | ICD-10-CM | POA: Diagnosis not present

## 2021-04-09 ENCOUNTER — Ambulatory Visit
Admission: RE | Admit: 2021-04-09 | Discharge: 2021-04-09 | Disposition: A | Discharge status: Home / Self Care | Payer: BC Managed Care – PPO | Source: Ambulatory Visit | Attending: Radiation Oncology | Admitting: Radiation Oncology

## 2021-04-09 DIAGNOSIS — Z510 Encounter for antineoplastic radiation therapy: Secondary | ICD-10-CM | POA: Diagnosis not present

## 2021-04-09 DIAGNOSIS — C20 Malignant neoplasm of rectum: Secondary | ICD-10-CM | POA: Diagnosis not present

## 2021-04-10 ENCOUNTER — Other Ambulatory Visit: Payer: Self-pay

## 2021-04-10 ENCOUNTER — Ambulatory Visit
Admission: RE | Admit: 2021-04-10 | Discharge: 2021-04-10 | Disposition: A | Discharge status: Home / Self Care | Payer: BC Managed Care – PPO | Source: Ambulatory Visit | Attending: Radiation Oncology | Admitting: Radiation Oncology

## 2021-04-10 DIAGNOSIS — C20 Malignant neoplasm of rectum: Secondary | ICD-10-CM | POA: Diagnosis not present

## 2021-04-10 DIAGNOSIS — Z510 Encounter for antineoplastic radiation therapy: Secondary | ICD-10-CM | POA: Diagnosis not present

## 2021-04-11 ENCOUNTER — Ambulatory Visit
Admission: RE | Admit: 2021-04-11 | Discharge: 2021-04-11 | Disposition: A | Discharge status: Home / Self Care | Payer: BC Managed Care – PPO | Source: Ambulatory Visit | Attending: Radiation Oncology | Admitting: Radiation Oncology

## 2021-04-11 DIAGNOSIS — Z510 Encounter for antineoplastic radiation therapy: Secondary | ICD-10-CM | POA: Diagnosis not present

## 2021-04-11 DIAGNOSIS — C20 Malignant neoplasm of rectum: Secondary | ICD-10-CM | POA: Diagnosis not present

## 2021-04-12 ENCOUNTER — Other Ambulatory Visit: Payer: Self-pay

## 2021-04-12 ENCOUNTER — Ambulatory Visit
Admission: RE | Admit: 2021-04-12 | Discharge: 2021-04-12 | Disposition: A | Discharge status: Home / Self Care | Payer: BC Managed Care – PPO | Source: Ambulatory Visit | Attending: Radiation Oncology | Admitting: Radiation Oncology

## 2021-04-12 DIAGNOSIS — C20 Malignant neoplasm of rectum: Secondary | ICD-10-CM | POA: Diagnosis not present

## 2021-04-12 DIAGNOSIS — Z510 Encounter for antineoplastic radiation therapy: Secondary | ICD-10-CM | POA: Diagnosis not present

## 2021-04-15 ENCOUNTER — Ambulatory Visit
Admission: RE | Admit: 2021-04-15 | Discharge: 2021-04-15 | Disposition: A | Discharge status: Home / Self Care | Payer: BC Managed Care – PPO | Source: Ambulatory Visit | Attending: Radiation Oncology | Admitting: Radiation Oncology

## 2021-04-15 ENCOUNTER — Other Ambulatory Visit: Payer: Self-pay

## 2021-04-15 DIAGNOSIS — C20 Malignant neoplasm of rectum: Secondary | ICD-10-CM | POA: Diagnosis not present

## 2021-04-15 DIAGNOSIS — Z510 Encounter for antineoplastic radiation therapy: Secondary | ICD-10-CM | POA: Diagnosis not present

## 2021-04-16 ENCOUNTER — Inpatient Hospital Stay: Payer: BC Managed Care – PPO

## 2021-04-16 ENCOUNTER — Inpatient Hospital Stay (HOSPITAL_BASED_OUTPATIENT_CLINIC_OR_DEPARTMENT_OTHER): Payer: BC Managed Care – PPO | Admitting: Hematology and Oncology

## 2021-04-16 ENCOUNTER — Inpatient Hospital Stay: Payer: BC Managed Care – PPO | Admitting: Hematology and Oncology

## 2021-04-16 ENCOUNTER — Encounter: Payer: Self-pay | Admitting: Hematology and Oncology

## 2021-04-16 ENCOUNTER — Ambulatory Visit
Admission: RE | Admit: 2021-04-16 | Discharge: 2021-04-16 | Disposition: A | Discharge status: Home / Self Care | Payer: BC Managed Care – PPO | Source: Ambulatory Visit | Attending: Radiation Oncology | Admitting: Radiation Oncology

## 2021-04-16 ENCOUNTER — Other Ambulatory Visit: Payer: Self-pay

## 2021-04-16 VITALS — BP 138/80 | HR 94 | Temp 98.3°F | Resp 18 | Wt 217.4 lb

## 2021-04-16 DIAGNOSIS — Z86711 Personal history of pulmonary embolism: Secondary | ICD-10-CM | POA: Diagnosis not present

## 2021-04-16 DIAGNOSIS — R110 Nausea: Secondary | ICD-10-CM

## 2021-04-16 DIAGNOSIS — Z510 Encounter for antineoplastic radiation therapy: Secondary | ICD-10-CM | POA: Diagnosis not present

## 2021-04-16 DIAGNOSIS — I10 Essential (primary) hypertension: Secondary | ICD-10-CM | POA: Diagnosis not present

## 2021-04-16 DIAGNOSIS — C20 Malignant neoplasm of rectum: Secondary | ICD-10-CM

## 2021-04-16 DIAGNOSIS — R198 Other specified symptoms and signs involving the digestive system and abdomen: Secondary | ICD-10-CM

## 2021-04-16 DIAGNOSIS — T451X5D Adverse effect of antineoplastic and immunosuppressive drugs, subsequent encounter: Secondary | ICD-10-CM

## 2021-04-16 DIAGNOSIS — Z79899 Other long term (current) drug therapy: Secondary | ICD-10-CM | POA: Diagnosis not present

## 2021-04-16 DIAGNOSIS — E119 Type 2 diabetes mellitus without complications: Secondary | ICD-10-CM | POA: Diagnosis not present

## 2021-04-16 LAB — CBC WITH DIFFERENTIAL/PLATELET
Abs Immature Granulocytes: 0.01 10*3/uL (ref 0.00–0.07)
Basophils Absolute: 0 10*3/uL (ref 0.0–0.1)
Basophils Relative: 0 %
Eosinophils Absolute: 0.2 10*3/uL (ref 0.0–0.5)
Eosinophils Relative: 7 %
HCT: 40.7 % (ref 39.0–52.0)
Hemoglobin: 13.5 g/dL (ref 13.0–17.0)
Immature Granulocytes: 0 %
Lymphocytes Relative: 36 %
Lymphs Abs: 1.2 10*3/uL (ref 0.7–4.0)
MCH: 25.7 pg — ABNORMAL LOW (ref 26.0–34.0)
MCHC: 33.2 g/dL (ref 30.0–36.0)
MCV: 77.4 fL — ABNORMAL LOW (ref 80.0–100.0)
Monocytes Absolute: 0.3 10*3/uL (ref 0.1–1.0)
Monocytes Relative: 8 %
Neutro Abs: 1.6 10*3/uL — ABNORMAL LOW (ref 1.7–7.7)
Neutrophils Relative %: 49 %
Platelets: 147 10*3/uL — ABNORMAL LOW (ref 150–400)
RBC: 5.26 MIL/uL (ref 4.22–5.81)
RDW: 16.5 % — ABNORMAL HIGH (ref 11.5–15.5)
WBC: 3.3 10*3/uL — ABNORMAL LOW (ref 4.0–10.5)
nRBC: 0 % (ref 0.0–0.2)

## 2021-04-16 LAB — CMP (CANCER CENTER ONLY)
ALT: 22 U/L (ref 0–44)
AST: 16 U/L (ref 15–41)
Albumin: 4.2 g/dL (ref 3.5–5.0)
Alkaline Phosphatase: 51 U/L (ref 38–126)
Anion gap: 10 (ref 5–15)
BUN: 17 mg/dL (ref 6–20)
CO2: 24 mmol/L (ref 22–32)
Calcium: 9.9 mg/dL (ref 8.9–10.3)
Chloride: 106 mmol/L (ref 98–111)
Creatinine: 1.2 mg/dL (ref 0.61–1.24)
GFR, Estimated: >60 mL/min (ref >60)
Glucose, Bld: 163 mg/dL — ABNORMAL HIGH (ref 70–99)
Potassium: 4.6 mmol/L (ref 3.5–5.1)
Sodium: 140 mmol/L (ref 135–145)
Total Bilirubin: 1 mg/dL (ref 0.3–1.2)
Total Protein: 7 g/dL (ref 6.5–8.1)

## 2021-04-16 LAB — CEA (IN HOUSE-CHCC): CEA (CHCC-In House): 5.34 ng/mL — ABNORMAL HIGH (ref 0.00–5.00)

## 2021-04-16 NOTE — Assessment & Plan Note (Signed)
This is a very pleasant 50 year old male patient with newly diagnosed T3BN0 adenocarcinoma of the rectum, MSS with no evidence of distant metastasis following up with medical oncology for consideration of concurrent chemoradiation. He has T3N0 or stage IIa adenocarcinoma of the rectum and treatment recommendations from TB suggested concurrent chemotherapy radiation with Xeloda versus 5-fluorouracil followed by consideration for surgery and adjuvant chemotherapy   Given concern for 4 mm perirectal lymph node which is not entirely diagnostic for involvement, we have also discussed total neoadjuvant approach which patient was reluctant given some issues with his work and disability leave.  He would like to proceed with chemoradiation followed by surgery and adjuvant chemotherapy as discussed.  He is currently on Xeloda day 1 04/03/2021, tolerating it reasonably well Grade 1 nausea, using PRN medication.  Ongoing tenesmus likely from combination of tumor as well as treatment, using stool softener daily No other major adverse effects Labs today Last day of planned treatment May 10 2021. He will follow up with surgery after CRT, scheduled.

## 2021-04-16 NOTE — Assessment & Plan Note (Signed)
Grade 1, ok to use PRN nausea medications as prescribed. No major concerns today, V.S stable.

## 2021-04-16 NOTE — Progress Notes (Signed)
[place] CONSULT NOTE  Patient Care Team: Shirline Frees, MD as PCP - General (Family Medicine) [name], MD as Consulting Physician (Hematology and Oncology) Jonnie Finner, RN (Inactive) as Oncology Nurse Navigator  CHIEF COMPLAINTS/PURPOSE OF CONSULTATION:  New diagnosis of adenocarcinoma rectum,  ASSESSMENT & PLAN:   Adenocarcinoma of rectum [facility]) This is a very pleasant 50 year old male patient with newly diagnosed T3BN0 adenocarcinoma of the rectum, MSS with no evidence of distant metastasis following up with medical oncology for consideration of concurrent chemoradiation. He has T3N0 or stage IIa adenocarcinoma of the rectum and treatment recommendations from TB suggested concurrent chemotherapy radiation with Xeloda versus 5-fluorouracil followed by consideration for surgery and adjuvant chemotherapy   Given concern for 4 mm perirectal lymph node which is not entirely diagnostic for involvement, we have also discussed total neoadjuvant approach which patient was reluctant given some issues with his work and disability leave.  He would like to proceed with chemoradiation followed by surgery and adjuvant chemotherapy as discussed.  He is currently on Xeloda day 1 04/03/2021, tolerating it reasonably well Grade 1 nausea, using PRN medication.  Ongoing tenesmus likely from combination of tumor as well as treatment, using stool softener daily No other major adverse effects Labs today Last day of planned treatment May 10 2021. He will follow up with surgery after CRT, scheduled.   Tenesmus Tenesmus likely from underlying tumor and may be ongoing edema from treatment. Encouraged to use stool softener daily and avoid straining if possible. We discussed that incomplete evacuation of bowel sensation is likely from tumor.  Chemotherapy-induced nausea Grade 1, ok to use PRN nausea medications as prescribed. No major concerns today, V.S stable.  Orders Placed  This Encounter  Procedures   CBC with Differential/Platelet    Standing Status:   Standing    Number of Occurrences:   22    Standing Expiration Date:   04/16/2022   Comprehensive metabolic panel    Standing Status:   Standing    Number of Occurrences:   33    Standing Expiration Date:   04/16/2022   CEA (IN HOUSE-[facility])    Standing Status:   Standing    Number of Occurrences:   3    Standing Expiration Date:   04/16/2022      HISTORY OF PRESENTING ILLNESS:  Steven [name] 50 y.o. male is here because of adenoca rectum on concurrent CRT.  This is a well-known patient to me who was previously seen for unprovoked bilateral pulmonary embolism.  During evaluation of his unprovoked PE, we did hypercoagulable work-up and we also requested imaging to rule out occult malignancy.  This CT abdomen identified asymmetric soft tissue changes along the left of the rectum and hence I recommended a colonoscopy referral was placed to Dr. Donneta Romberg office.  He had colonoscopy in June which showed a palpable rectal mass on digital exam likely malignant appearing in the rectum identified beginning 8 cm from the anal verge extending to 13 cm above the anal verge partially circumferential and oozing was present.  He had pathology which showed well-differentiated adenocarcinoma.  Staging CT scans failed to show any evidence of metastatic disease.  MRI pelvis stage T3bN0 ( ? 4 mm peri rectal LN) We discussed about TNT treatment vs CRT followed by surgery and adj chemo. He wanted to proceed with CRT and surgery first because of some FMLA issues followed by adj therapy  Interval history He started on Xeloda 04/03/2021 He is here for  2 week FU. Doing well except for fatigue, ongoing tenesmus and occasional nausea Tenesmus is bothersome to him, taking a stool softener daily No mouth ulcers, skin changes on palms and soles. No change in breathing, urinary habits.  Rest of the pertinent 10 point ROS reviewed and  negative.  MEDICAL HISTORY:  Past Medical History:  Diagnosis Date   Anxiety    Asthma    Depression    Diabetes mellitus without complication (HCC)    HLD (hyperlipidemia)    Hypertension    Kidney stones    OSA (obstructive sleep apnea)    CPAP    SURGICAL HISTORY: Past Surgical History:  Procedure Laterality Date   APPENDECTOMY     CYST EXCISION     left neck   WISDOM TOOTH EXTRACTION      SOCIAL HISTORY: Social History   Socioeconomic History   Marital status: Married    Spouse name: Not on file   Number of children: 3   Years of education: Not on file   Highest education level: Not on file  Occupational History   Occupation: Gaffer  Tobacco Use   Smoking status: Never   Smokeless tobacco: Never  Vaping Use   Vaping Use: Never used  Substance and Sexual Activity   Alcohol use: Yes    Comment: 1 beer every 3-4 months   Drug use: Never   Sexual activity: Not on file  Other Topics Concern   Not on file  Social History Narrative   Not on file   Social Determinants of Health   Financial Resource Strain: Not on file  Food Insecurity: Not on file  Transportation Needs: Not on file  Physical Activity: Not on file  Stress: Not on file  Social Connections: Not on file  Intimate Partner Violence: Not on file    FAMILY HISTORY: Family History  Problem Relation Age of Onset   Diabetes Mellitus II Mother    Breast cancer Mother 30   Thrombophlebitis Father    Breast cancer Maternal Grandmother 42   Diabetes Maternal Grandmother    Breast cancer Maternal Aunt 60   Diabetes Maternal Aunt    Hypertension Paternal Grandmother    Colon cancer Neg Hx    Esophageal cancer Neg Hx    Inflammatory bowel disease Neg Hx    Liver disease Neg Hx    Pancreatic cancer Neg Hx    Rectal cancer Neg Hx    Stomach cancer Neg Hx     ALLERGIES:  is allergic to contrast media [iodinated diagnostic agents].  MEDICATIONS:  Current Outpatient Medications   Medication Sig Dispense Refill   acetaminophen (TYLENOL) 500 MG tablet Take 1,000 mg by mouth every 6 (six) hours as needed for mild pain.     albuterol (VENTOLIN HFA) 108 (90 Base) MCG/ACT inhaler Inhale 2 puffs into the lungs every 4 (four) hours as needed for wheezing.     ALPRAZolam (XANAX) 0.25 MG tablet Take 0.25 mg by mouth daily as needed for anxiety or sleep.     blood glucose meter kit and supplies KIT Dispense based on patient and insurance preference. Use up to four times daily as directed. (FOR ICD-9 250.00, 250.01). 1 each 0   capecitabine (XELODA) 500 MG tablet Take Monday through Friday during radiation. Take 4 tablets in AM and 3 tablets in PM. Taken within 30 minutes after meals. 150 tablet 0   Cinnamon 500 MG capsule See admin instructions. 1000 mg daily  fenofibrate 160 MG tablet Take 160 mg by mouth daily.     Ferrous Sulfate (IRON) 325 (65 Fe) MG TABS Take 325 mg by mouth daily.     irbesartan (AVAPRO) 150 MG tablet Take 150 mg by mouth daily.     meloxicam (MOBIC) 15 MG tablet Take 15 mg by mouth at bedtime.      ondansetron (ZOFRAN) 8 MG tablet Take 1 tablet (8 mg total) by mouth 2 (two) times daily as needed (Nausea or vomiting). 30 tablet 1   polycarbophil (FIBERCON) 625 MG tablet Take 625 mg by mouth daily. 2 tabs per day     predniSONE (DELTASONE) 50 MG tablet Take one 50 mg tablet by mouth 13 hours prior to scan, one 50 mg tablet by mouth 7 hours prior to scan and one 50 mg tablet 1 hour before scan. 3 tablet 0   prochlorperazine (COMPAZINE) 10 MG tablet Take 1 tablet (10 mg total) by mouth every 6 (six) hours as needed (Nausea or vomiting). 30 tablet 1   tetrahydrozoline-zinc (VISINE-AC) 0.05-0.25 % ophthalmic solution Place 2 drops into both eyes as needed (allergy).     XARELTO 20 MG TABS tablet Take 20 mg by mouth daily.     XIGDUO XR 01-999 MG TB24 Take 2 tablets by mouth daily.     polycarbophil (FIBERCON) 625 MG tablet Take 625 mg by mouth in the morning and  at bedtime.     No current facility-administered medications for this visit.     PHYSICAL EXAMINATION:  ECOG PERFORMANCE STATUS: 0 - Asymptomatic  Vitals:   04/16/21 0937  BP: 138/80  Pulse: 94  Resp: 18  Temp: 98.3 F (36.8 C)  SpO2: 98%   Filed Weights   04/16/21 0937  Weight: 217 lb 6.4 oz (98.6 kg)   GENERAL:alert, no distress and comfortable, obese SKIN: skin color, texture, turgor are normal, no rashes or significant lesions EYES: normal, conjunctiva are pink and non-injected, sclera clear OROPHARYNX:no exudate, no erythema and lips, buccal mucosa, and tongue normal  NECK: supple, thyroid normal size, non-tender, without nodularity LYMPH:  no palpable lymphadenopathy in the cervical, axillary or inguinal LUNGS: clear to auscultation and percussion with normal breathing effort HEART: regular rate & rhythm and no murmurs and no lower extremity edema ABDOMEN:abdomen soft, non-tender and normal bowel sounds Musculoskeletal:no cyanosis of digits and no clubbing  PSYCH: alert & oriented x 3 with fluent speech NEURO: no focal motor/sensory deficits  LABORATORY DATA:  I have reviewed the data as listed Lab Results  Component Value Date   WBC 3.3 (L) 04/16/2021   HGB 13.5 04/16/2021   HCT 40.7 04/16/2021   MCV 77.4 (L) 04/16/2021   PLT 147 (L) 04/16/2021     Chemistry      Component Value Date/Time   NA 140 04/03/2021 1037   K 4.7 04/03/2021 1037   CL 106 04/03/2021 1037   CO2 24 04/03/2021 1037   BUN 19 04/03/2021 1037   CREATININE 1.08 04/03/2021 1037      Component Value Date/Time   CALCIUM 9.7 04/03/2021 1037   ALKPHOS 56 03/21/2021 1520   AST 20 03/21/2021 1520   ALT 27 03/21/2021 1520   BILITOT 1.1 03/21/2021 1520     Labs today  RADIOGRAPHIC STUDIES: I have personally reviewed the radiological images as listed and agreed with the findings in the report. No results found.  All questions were answered. The patient knows to call the clinic with  any problems, questions or  concerns.    [name], MD 04/16/2021 10:50 AM

## 2021-04-16 NOTE — Assessment & Plan Note (Signed)
Tenesmus likely from underlying tumor and may be ongoing edema from treatment. Encouraged to use stool softener daily and avoid straining if possible. We discussed that incomplete evacuation of bowel sensation is likely from tumor.

## 2021-04-17 ENCOUNTER — Ambulatory Visit
Admission: RE | Admit: 2021-04-17 | Discharge: 2021-04-17 | Disposition: A | Discharge status: Home / Self Care | Payer: BC Managed Care – PPO | Source: Ambulatory Visit | Attending: Radiation Oncology | Admitting: Radiation Oncology

## 2021-04-17 DIAGNOSIS — Z510 Encounter for antineoplastic radiation therapy: Secondary | ICD-10-CM | POA: Diagnosis not present

## 2021-04-17 DIAGNOSIS — C20 Malignant neoplasm of rectum: Secondary | ICD-10-CM | POA: Diagnosis not present

## 2021-04-18 ENCOUNTER — Other Ambulatory Visit: Payer: Self-pay

## 2021-04-18 ENCOUNTER — Ambulatory Visit
Admission: RE | Admit: 2021-04-18 | Discharge: 2021-04-18 | Disposition: A | Discharge status: Home / Self Care | Payer: BC Managed Care – PPO | Source: Ambulatory Visit | Attending: Radiation Oncology | Admitting: Radiation Oncology

## 2021-04-18 ENCOUNTER — Inpatient Hospital Stay: Payer: BC Managed Care – PPO | Admitting: Hematology and Oncology

## 2021-04-18 DIAGNOSIS — Z510 Encounter for antineoplastic radiation therapy: Secondary | ICD-10-CM | POA: Diagnosis not present

## 2021-04-18 DIAGNOSIS — C20 Malignant neoplasm of rectum: Secondary | ICD-10-CM | POA: Diagnosis not present

## 2021-04-19 ENCOUNTER — Other Ambulatory Visit: Payer: Self-pay

## 2021-04-19 ENCOUNTER — Ambulatory Visit
Admission: RE | Admit: 2021-04-19 | Discharge: 2021-04-19 | Disposition: A | Discharge status: Home / Self Care | Payer: BC Managed Care – PPO | Source: Ambulatory Visit | Attending: Radiation Oncology | Admitting: Radiation Oncology

## 2021-04-19 DIAGNOSIS — Z510 Encounter for antineoplastic radiation therapy: Secondary | ICD-10-CM | POA: Diagnosis not present

## 2021-04-19 DIAGNOSIS — C20 Malignant neoplasm of rectum: Secondary | ICD-10-CM | POA: Diagnosis not present

## 2021-04-19 MED ORDER — CAPECITABINE 500 MG PO TABS: ORAL_TABLET | ORAL | 0 refills | Status: DC

## 2021-04-22 ENCOUNTER — Ambulatory Visit
Admission: RE | Admit: 2021-04-22 | Discharge: 2021-04-22 | Disposition: A | Discharge status: Home / Self Care | Payer: BC Managed Care – PPO | Source: Ambulatory Visit | Attending: Radiation Oncology | Admitting: Radiation Oncology

## 2021-04-22 ENCOUNTER — Other Ambulatory Visit: Payer: Self-pay

## 2021-04-22 DIAGNOSIS — Z510 Encounter for antineoplastic radiation therapy: Secondary | ICD-10-CM | POA: Diagnosis not present

## 2021-04-22 DIAGNOSIS — C20 Malignant neoplasm of rectum: Secondary | ICD-10-CM | POA: Diagnosis not present

## 2021-04-23 ENCOUNTER — Other Ambulatory Visit: Payer: Self-pay | Admitting: Radiation Oncology

## 2021-04-23 ENCOUNTER — Ambulatory Visit
Admission: RE | Admit: 2021-04-23 | Discharge: 2021-04-23 | Disposition: A | Discharge status: Home / Self Care | Payer: BC Managed Care – PPO | Source: Ambulatory Visit | Attending: Radiation Oncology | Admitting: Radiation Oncology

## 2021-04-23 DIAGNOSIS — Z510 Encounter for antineoplastic radiation therapy: Secondary | ICD-10-CM | POA: Diagnosis not present

## 2021-04-23 DIAGNOSIS — C20 Malignant neoplasm of rectum: Secondary | ICD-10-CM | POA: Diagnosis not present

## 2021-04-23 MED ORDER — HYDROCORTISONE ACETATE 25 MG RE SUPP: 25.0000 mg | Freq: Two times a day (BID) | RECTAL | 2 refills | Status: DC

## 2021-04-23 MED ORDER — TAMSULOSIN HCL 0.4 MG PO CAPS: 0.4000 mg | ORAL_CAPSULE | Freq: Every day | ORAL | 1 refills | Status: DC

## 2021-04-23 NOTE — Progress Notes (Signed)
Steven [name] is a very pleasant 50 year old gentleman who is currently undergoing radiation and chemosensitization with Xeloda for a distal rectal cancer.  He is received 15 of the planned 28 radiation treatments.  In the last week he has started to develop progressive urgency to have a bowel movement and feels especially in the evenings that he is having such pressure and discomfort its very uncomfortable and feels like his sphincter is being turned inside out.  He did he also describes stinging when he urinates and having difficulty emptying his bladder in the morning and in the evening.  He has been using ibuprofen and Tylenol which seems to help alleviate some of his symptoms but in the evening and early morning is the most difficult time for him.  We discussed the side effect profiles of tamsulosin and Anusol suppositories, the patient is interested in starting these, prescriptions were sent to his pharmacy, we also discussed the use of over-the-counter Azo.  He will contact us if he has any other questions prior to the end of this week's treatment.

## 2021-04-24 ENCOUNTER — Ambulatory Visit
Admission: RE | Admit: 2021-04-24 | Discharge: 2021-04-24 | Disposition: A | Discharge status: Home / Self Care | Payer: BC Managed Care – PPO | Source: Ambulatory Visit | Attending: Radiation Oncology | Admitting: Radiation Oncology

## 2021-04-24 DIAGNOSIS — C20 Malignant neoplasm of rectum: Secondary | ICD-10-CM | POA: Diagnosis not present

## 2021-04-24 DIAGNOSIS — Z510 Encounter for antineoplastic radiation therapy: Secondary | ICD-10-CM | POA: Diagnosis not present

## 2021-04-25 ENCOUNTER — Ambulatory Visit
Admission: RE | Admit: 2021-04-25 | Discharge: 2021-04-25 | Disposition: A | Discharge status: Home / Self Care | Payer: BC Managed Care – PPO | Source: Ambulatory Visit | Attending: Radiation Oncology | Admitting: Radiation Oncology

## 2021-04-25 ENCOUNTER — Other Ambulatory Visit: Payer: Self-pay

## 2021-04-25 DIAGNOSIS — C20 Malignant neoplasm of rectum: Secondary | ICD-10-CM | POA: Diagnosis not present

## 2021-04-25 DIAGNOSIS — Z510 Encounter for antineoplastic radiation therapy: Secondary | ICD-10-CM | POA: Diagnosis not present

## 2021-04-26 ENCOUNTER — Ambulatory Visit
Admission: RE | Admit: 2021-04-26 | Discharge: 2021-04-26 | Disposition: A | Discharge status: Home / Self Care | Payer: BC Managed Care – PPO | Source: Ambulatory Visit | Attending: Radiation Oncology | Admitting: Radiation Oncology

## 2021-04-26 DIAGNOSIS — Z510 Encounter for antineoplastic radiation therapy: Secondary | ICD-10-CM | POA: Diagnosis not present

## 2021-04-26 DIAGNOSIS — C20 Malignant neoplasm of rectum: Secondary | ICD-10-CM | POA: Diagnosis not present

## 2021-04-29 ENCOUNTER — Other Ambulatory Visit: Payer: Self-pay

## 2021-04-29 ENCOUNTER — Ambulatory Visit
Admission: RE | Admit: 2021-04-29 | Discharge: 2021-04-29 | Disposition: A | Discharge status: Home / Self Care | Payer: BC Managed Care – PPO | Source: Ambulatory Visit | Attending: Radiation Oncology | Admitting: Radiation Oncology

## 2021-04-29 DIAGNOSIS — Z510 Encounter for antineoplastic radiation therapy: Secondary | ICD-10-CM | POA: Insufficient documentation

## 2021-04-29 DIAGNOSIS — C20 Malignant neoplasm of rectum: Secondary | ICD-10-CM | POA: Diagnosis not present

## 2021-04-30 ENCOUNTER — Inpatient Hospital Stay: Payer: BC Managed Care – PPO

## 2021-04-30 ENCOUNTER — Ambulatory Visit
Admission: RE | Admit: 2021-04-30 | Discharge: 2021-04-30 | Disposition: A | Discharge status: Home / Self Care | Payer: BC Managed Care – PPO | Source: Ambulatory Visit | Attending: Radiation Oncology | Admitting: Radiation Oncology

## 2021-04-30 ENCOUNTER — Inpatient Hospital Stay: Payer: BC Managed Care – PPO | Attending: Hematology and Oncology | Admitting: Hematology and Oncology

## 2021-04-30 ENCOUNTER — Encounter: Payer: Self-pay | Admitting: Hematology and Oncology

## 2021-04-30 VITALS — BP 129/81 | HR 102 | Temp 97.6°F | Resp 18 | Ht 69.0 in | Wt 214.6 lb

## 2021-04-30 DIAGNOSIS — I10 Essential (primary) hypertension: Secondary | ICD-10-CM | POA: Insufficient documentation

## 2021-04-30 DIAGNOSIS — C20 Malignant neoplasm of rectum: Secondary | ICD-10-CM

## 2021-04-30 DIAGNOSIS — Z803 Family history of malignant neoplasm of breast: Secondary | ICD-10-CM | POA: Diagnosis not present

## 2021-04-30 DIAGNOSIS — R198 Other specified symptoms and signs involving the digestive system and abdomen: Secondary | ICD-10-CM

## 2021-04-30 DIAGNOSIS — Z510 Encounter for antineoplastic radiation therapy: Secondary | ICD-10-CM | POA: Diagnosis not present

## 2021-04-30 DIAGNOSIS — Z79899 Other long term (current) drug therapy: Secondary | ICD-10-CM | POA: Diagnosis not present

## 2021-04-30 DIAGNOSIS — G893 Neoplasm related pain (acute) (chronic): Secondary | ICD-10-CM | POA: Insufficient documentation

## 2021-04-30 DIAGNOSIS — R110 Nausea: Secondary | ICD-10-CM

## 2021-04-30 DIAGNOSIS — T451X5A Adverse effect of antineoplastic and immunosuppressive drugs, initial encounter: Secondary | ICD-10-CM

## 2021-04-30 LAB — COMPREHENSIVE METABOLIC PANEL
ALT: 23 U/L (ref 0–44)
AST: 15 U/L (ref 15–41)
Albumin: 4.4 g/dL (ref 3.5–5.0)
Alkaline Phosphatase: 52 U/L (ref 38–126)
Anion gap: 12 (ref 5–15)
BUN: 18 mg/dL (ref 6–20)
CO2: 22 mmol/L (ref 22–32)
Calcium: 10.1 mg/dL (ref 8.9–10.3)
Chloride: 106 mmol/L (ref 98–111)
Creatinine, Ser: 1.09 mg/dL (ref 0.61–1.24)
GFR, Estimated: >60 mL/min (ref >60)
Glucose, Bld: 173 mg/dL — ABNORMAL HIGH (ref 70–99)
Potassium: 4.6 mmol/L (ref 3.5–5.1)
Sodium: 140 mmol/L (ref 135–145)
Total Bilirubin: 1 mg/dL (ref 0.3–1.2)
Total Protein: 7.3 g/dL (ref 6.5–8.1)

## 2021-04-30 LAB — CBC WITH DIFFERENTIAL/PLATELET
Abs Immature Granulocytes: 0.02 10*3/uL (ref 0.00–0.07)
Basophils Absolute: 0 10*3/uL (ref 0.0–0.1)
Basophils Relative: 1 %
Eosinophils Absolute: 0.2 10*3/uL (ref 0.0–0.5)
Eosinophils Relative: 4 %
HCT: 39.4 % (ref 39.0–52.0)
Hemoglobin: 13.6 g/dL (ref 13.0–17.0)
Immature Granulocytes: 1 %
Lymphocytes Relative: 17 %
Lymphs Abs: 0.8 10*3/uL (ref 0.7–4.0)
MCH: 26.7 pg (ref 26.0–34.0)
MCHC: 34.5 g/dL (ref 30.0–36.0)
MCV: 77.3 fL — ABNORMAL LOW (ref 80.0–100.0)
Monocytes Absolute: 0.4 10*3/uL (ref 0.1–1.0)
Monocytes Relative: 10 %
Neutro Abs: 3 10*3/uL (ref 1.7–7.7)
Neutrophils Relative %: 67 %
Platelets: 169 10*3/uL (ref 150–400)
RBC: 5.1 MIL/uL (ref 4.22–5.81)
RDW: 19.2 % — ABNORMAL HIGH (ref 11.5–15.5)
WBC: 4.4 10*3/uL (ref 4.0–10.5)
nRBC: 0 % (ref 0.0–0.2)

## 2021-04-30 NOTE — Assessment & Plan Note (Signed)
Tenesmus related to the adenocarcinoma of the rectum. Continue Anusol as prescribed.

## 2021-04-30 NOTE — Progress Notes (Signed)
[place] CONSULT NOTE  Patient Care Team: Shirline Frees, MD as PCP - General (Family Medicine) [name], MD as Consulting Physician (Hematology and Oncology) Jonnie Finner, RN (Inactive) as Oncology Nurse Navigator  CHIEF COMPLAINTS/PURPOSE OF CONSULTATION:  New diagnosis of adenocarcinoma rectum,  ASSESSMENT & PLAN:   Adenocarcinoma of rectum [facility]) This is a very pleasant 50 year old male patient with newly diagnosed T3BN0 adenocarcinoma of the rectum, MSS with no evidence of distant metastasis following up with medical oncology for consideration of concurrent chemoradiation. He has T3N0 or stage IIa adenocarcinoma of the rectum and treatment recommendations from TB suggested concurrent chemotherapy radiation with Xeloda versus 5-fluorouracil followed by consideration for surgery and adjuvant chemotherapy   Given concern for 4 mm perirectal lymph node which is not entirely diagnostic for involvement, we have also discussed total neoadjuvant approach which patient was reluctant given some issues with his work and disability leave.  He would like to proceed with chemoradiation followed by surgery and adjuvant chemotherapy as discussed.  He is currently on Xeloda week 4, started 04/03/2021, tolerating it reasonably well Adverse effects today include nausea which is controlled with as needed nausea medications as well as extreme fatigue. Labs from today reviewed, okay to continue current dose of Xeloda, last day of treatment is May 10, 2021.  Chemotherapy-induced nausea Continue as needed Zofran and Compazine. Currently grade 1, will continue to monitor.  Tenesmus Tenesmus related to the adenocarcinoma of the rectum. Continue Anusol as prescribed.  Cancer related pain He is currently taking ibuprofen 600 mg, Tylenol 1 g and 1 meloxicam daily for management of pain.  He is able to manage his pain well with this regimen.  We will continue to monitor and add  medications as needed.  No orders of the defined types were placed in this encounter.     HISTORY OF PRESENTING ILLNESS:  Edison Nicholson 50 y.o. male is here because of adenoca rectum on concurrent CRT.  This is a well-known patient to me who was previously seen for unprovoked bilateral pulmonary embolism.  During evaluation of his unprovoked PE, we did hypercoagulable work-up and we also requested imaging to rule out occult malignancy.  This CT abdomen identified asymmetric soft tissue changes along the left of the rectum and hence I recommended a colonoscopy referral was placed to Dr. Donneta Romberg office.  He had colonoscopy in June which showed a palpable rectal mass on digital exam likely malignant appearing in the rectum identified beginning 8 cm from the anal verge extending to 13 cm above the anal verge partially circumferential and oozing was present.  He had pathology which showed well-differentiated adenocarcinoma.  Staging CT scans failed to show any evidence of metastatic disease.  MRI pelvis stage T3bN0 ( ? 4 mm peri rectal LN) We discussed about TNT treatment vs CRT followed by surgery and adj chemo. He wanted to proceed with CRT and surgery first because of some FMLA issues followed by adj therapy  Interval history He started on Xeloda 04/03/2021 He is here for week for follow-up.  He has been doing well except for extreme fatigue, some bouts of nausea which he has been managing.  Tenesmus was bothersome, he is now taking Anusol suppository once a day as well as using Flomax for urinary issues.  No mouth ulcers, skin changes, change in breathing, change in urinary habits. August 12 is the last day of his radiation and he is scheduled to follow-up with surgeon after that.  Rest of the  pertinent 10 point ROS reviewed and negative.  MEDICAL HISTORY:  Past Medical History:  Diagnosis Date   Anxiety    Asthma    Depression    Diabetes mellitus without complication (HCC)    HLD  (hyperlipidemia)    Hypertension    Kidney stones    OSA (obstructive sleep apnea)    CPAP    SURGICAL HISTORY: Past Surgical History:  Procedure Laterality Date   APPENDECTOMY     CYST EXCISION     left neck   WISDOM TOOTH EXTRACTION      SOCIAL HISTORY: Social History   Socioeconomic History   Marital status: Married    Spouse name: Not on file   Number of children: 3   Years of education: Not on file   Highest education level: Not on file  Occupational History   Occupation: Gaffer  Tobacco Use   Smoking status: Never   Smokeless tobacco: Never  Vaping Use   Vaping Use: Never used  Substance and Sexual Activity   Alcohol use: Yes    Comment: 1 beer every 3-4 months   Drug use: Never   Sexual activity: Not on file  Other Topics Concern   Not on file  Social History Narrative   Not on file   Social Determinants of Health   Financial Resource Strain: Not on file  Food Insecurity: Not on file  Transportation Needs: Not on file  Physical Activity: Not on file  Stress: Not on file  Social Connections: Not on file  Intimate Partner Violence: Not on file    FAMILY HISTORY: Family History  Problem Relation Age of Onset   Diabetes Mellitus II Mother    Breast cancer Mother 14   Thrombophlebitis Father    Breast cancer Maternal Grandmother 52   Diabetes Maternal Grandmother    Breast cancer Maternal Aunt 60   Diabetes Maternal Aunt    Hypertension Paternal Grandmother    Colon cancer Neg Hx    Esophageal cancer Neg Hx    Inflammatory bowel disease Neg Hx    Liver disease Neg Hx    Pancreatic cancer Neg Hx    Rectal cancer Neg Hx    Stomach cancer Neg Hx     ALLERGIES:  is allergic to contrast media [iodinated diagnostic agents].  MEDICATIONS:  Current Outpatient Medications  Medication Sig Dispense Refill   acetaminophen (TYLENOL) 500 MG tablet Take 1,000 mg by mouth every 6 (six) hours as needed for mild pain.     albuterol (VENTOLIN  HFA) 108 (90 Base) MCG/ACT inhaler Inhale 2 puffs into the lungs every 4 (four) hours as needed for wheezing.     ALPRAZolam (XANAX) 0.25 MG tablet Take 0.25 mg by mouth daily as needed for anxiety or sleep.     blood glucose meter kit and supplies KIT Dispense based on patient and insurance preference. Use up to four times daily as directed. (FOR ICD-9 250.00, 250.01). 1 each 0   capecitabine (XELODA) 500 MG tablet Take Monday through Friday during radiation. Take 4 tablets in AM and 3 tablets in PM. Taken within 30 minutes after meals. 150 tablet 0   Cinnamon 500 MG capsule See admin instructions. 1000 mg daily     fenofibrate 160 MG tablet Take 160 mg by mouth daily.     Ferrous Sulfate (IRON) 325 (65 Fe) MG TABS Take 325 mg by mouth daily.     hydrocortisone (ANUSOL-HC) 25 MG suppository Place 1 suppository (25 mg  total) rectally 2 (two) times daily. 12 suppository 2   irbesartan (AVAPRO) 150 MG tablet Take 150 mg by mouth daily.     meloxicam (MOBIC) 15 MG tablet Take 15 mg by mouth at bedtime.      ondansetron (ZOFRAN) 8 MG tablet Take 1 tablet (8 mg total) by mouth 2 (two) times daily as needed (Nausea or vomiting). 30 tablet 1   polycarbophil (FIBERCON) 625 MG tablet Take 625 mg by mouth daily. 2 tabs per day     predniSONE (DELTASONE) 50 MG tablet Take one 50 mg tablet by mouth 13 hours prior to scan, one 50 mg tablet by mouth 7 hours prior to scan and one 50 mg tablet 1 hour before scan. 3 tablet 0   prochlorperazine (COMPAZINE) 10 MG tablet Take 1 tablet (10 mg total) by mouth every 6 (six) hours as needed (Nausea or vomiting). 30 tablet 1   tamsulosin (FLOMAX) 0.4 MG CAPS capsule Take 1 capsule (0.4 mg total) by mouth daily. 30 capsule 1   tetrahydrozoline-zinc (VISINE-AC) 0.05-0.25 % ophthalmic solution Place 2 drops into both eyes as needed (allergy).     XARELTO 20 MG TABS tablet Take 20 mg by mouth daily.     XIGDUO XR 01-999 MG TB24 Take 2 tablets by mouth daily.     No current  facility-administered medications for this visit.    PHYSICAL EXAMINATION:  ECOG PERFORMANCE STATUS: 0 - Asymptomatic  Vitals:   04/30/21 1310  BP: 129/81  Pulse: (!) 102  Resp: 18  Temp: 97.6 F (36.4 C)  SpO2: 98%   Filed Weights   04/30/21 1310  Weight: 214 lb 9.6 oz (97.3 kg)   GENERAL:alert, no distress and comfortable, obese SKIN: skin color, texture, turgor are normal, no rashes or significant lesions EYES: normal, conjunctiva are pink and non-injected, sclera clear OROPHARYNX:no exudate, no erythema and lips, buccal mucosa, and tongue normal  NECK: supple, thyroid normal size, non-tender, without nodularity LYMPH:  no palpable lymphadenopathy in the cervical, axillary or inguinal LUNGS: clear to auscultation and percussion with normal breathing effort HEART: regular rate & rhythm and no murmurs and no lower extremity edema ABDOMEN:abdomen soft, non-tender and normal bowel sounds Musculoskeletal:no cyanosis of digits and no clubbing  PSYCH: alert & oriented x 3 with fluent speech NEURO: no focal motor/sensory deficits  LABORATORY DATA:  I have reviewed the data as listed Lab Results  Component Value Date   WBC 4.4 04/30/2021   HGB 13.6 04/30/2021   HCT 39.4 04/30/2021   MCV 77.3 (L) 04/30/2021   PLT 169 04/30/2021     Chemistry      Component Value Date/Time   NA 140 04/30/2021 1345   K 4.6 04/30/2021 1345   CL 106 04/30/2021 1345   CO2 22 04/30/2021 1345   BUN 18 04/30/2021 1345   CREATININE 1.09 04/30/2021 1345   CREATININE 1.20 04/16/2021 1047      Component Value Date/Time   CALCIUM 10.1 04/30/2021 1345   ALKPHOS 52 04/30/2021 1345   AST 15 04/30/2021 1345   AST 16 04/16/2021 1047   ALT 23 04/30/2021 1345   ALT 22 04/16/2021 1047   BILITOT 1.0 04/30/2021 1345   BILITOT 1.0 04/16/2021 1047     Labs today reviewed, okay to continue Xeloda as prescribed.  RADIOGRAPHIC STUDIES: I have personally reviewed the radiological images as listed and  agreed with the findings in the report. No results found.  All questions were answered. The patient knows to  call the clinic with any problems, questions or concerns.    [name], MD 04/30/2021 2:37 PM

## 2021-04-30 NOTE — Assessment & Plan Note (Signed)
This is a very pleasant 50 year old male patient with newly diagnosed T3BN0 adenocarcinoma of the rectum, MSS with no evidence of distant metastasis following up with medical oncology for consideration of concurrent chemoradiation. He has T3N0 or stage IIa adenocarcinoma of the rectum and treatment recommendations from TB suggested concurrent chemotherapy radiation with Xeloda versus 5-fluorouracil followed by consideration for surgery and adjuvant chemotherapy   Given concern for 4 mm perirectal lymph node which is not entirely diagnostic for involvement, we have also discussed total neoadjuvant approach which patient was reluctant given some issues with his work and disability leave.  He would like to proceed with chemoradiation followed by surgery and adjuvant chemotherapy as discussed.  He is currently on Xeloda week 4, started 04/03/2021, tolerating it reasonably well Adverse effects today include nausea which is controlled with as needed nausea medications as well as extreme fatigue. Labs from today reviewed, okay to continue current dose of Xeloda, last day of treatment is May 10, 2021.

## 2021-04-30 NOTE — Assessment & Plan Note (Signed)
He is currently taking ibuprofen 600 mg, Tylenol 1 g and 1 meloxicam daily for management of pain.  He is able to manage his pain well with this regimen.  We will continue to monitor and add medications as needed.

## 2021-04-30 NOTE — Assessment & Plan Note (Signed)
Continue as needed Zofran and Compazine. Currently grade 1, will continue to monitor.

## 2021-05-01 ENCOUNTER — Other Ambulatory Visit: Payer: Self-pay

## 2021-05-01 ENCOUNTER — Ambulatory Visit
Admission: RE | Admit: 2021-05-01 | Discharge: 2021-05-01 | Disposition: A | Discharge status: Home / Self Care | Payer: BC Managed Care – PPO | Source: Ambulatory Visit | Attending: Radiation Oncology | Admitting: Radiation Oncology

## 2021-05-01 ENCOUNTER — Inpatient Hospital Stay: Payer: BC Managed Care – PPO | Admitting: Hematology and Oncology

## 2021-05-01 ENCOUNTER — Encounter: Payer: Self-pay | Admitting: Hematology and Oncology

## 2021-05-01 DIAGNOSIS — C20 Malignant neoplasm of rectum: Secondary | ICD-10-CM | POA: Diagnosis not present

## 2021-05-01 DIAGNOSIS — Z510 Encounter for antineoplastic radiation therapy: Secondary | ICD-10-CM | POA: Diagnosis not present

## 2021-05-01 LAB — CEA (IN HOUSE-CHCC): CEA (CHCC-In House): 2.68 ng/mL (ref 0.00–5.00)

## 2021-05-02 ENCOUNTER — Ambulatory Visit
Admission: RE | Admit: 2021-05-02 | Discharge: 2021-05-02 | Disposition: A | Discharge status: Home / Self Care | Payer: BC Managed Care – PPO | Source: Ambulatory Visit | Attending: Radiation Oncology | Admitting: Radiation Oncology

## 2021-05-02 DIAGNOSIS — C20 Malignant neoplasm of rectum: Secondary | ICD-10-CM | POA: Diagnosis not present

## 2021-05-02 DIAGNOSIS — Z510 Encounter for antineoplastic radiation therapy: Secondary | ICD-10-CM | POA: Diagnosis not present

## 2021-05-03 ENCOUNTER — Ambulatory Visit
Admission: RE | Admit: 2021-05-03 | Discharge: 2021-05-03 | Disposition: A | Discharge status: Home / Self Care | Payer: BC Managed Care – PPO | Source: Ambulatory Visit | Attending: Radiation Oncology | Admitting: Radiation Oncology

## 2021-05-03 DIAGNOSIS — C20 Malignant neoplasm of rectum: Secondary | ICD-10-CM | POA: Diagnosis not present

## 2021-05-03 DIAGNOSIS — Z510 Encounter for antineoplastic radiation therapy: Secondary | ICD-10-CM | POA: Diagnosis not present

## 2021-05-06 ENCOUNTER — Ambulatory Visit
Admission: RE | Admit: 2021-05-06 | Discharge: 2021-05-06 | Disposition: A | Discharge status: Home / Self Care | Payer: BC Managed Care – PPO | Source: Ambulatory Visit | Attending: Radiation Oncology | Admitting: Radiation Oncology

## 2021-05-06 ENCOUNTER — Other Ambulatory Visit: Payer: Self-pay

## 2021-05-06 DIAGNOSIS — C20 Malignant neoplasm of rectum: Secondary | ICD-10-CM | POA: Diagnosis not present

## 2021-05-06 DIAGNOSIS — Z510 Encounter for antineoplastic radiation therapy: Secondary | ICD-10-CM | POA: Diagnosis not present

## 2021-05-07 ENCOUNTER — Ambulatory Visit
Admission: RE | Admit: 2021-05-07 | Discharge: 2021-05-07 | Disposition: A | Discharge status: Home / Self Care | Payer: BC Managed Care – PPO | Source: Ambulatory Visit | Attending: Radiation Oncology | Admitting: Radiation Oncology

## 2021-05-07 DIAGNOSIS — Z510 Encounter for antineoplastic radiation therapy: Secondary | ICD-10-CM | POA: Diagnosis not present

## 2021-05-07 DIAGNOSIS — C20 Malignant neoplasm of rectum: Secondary | ICD-10-CM | POA: Diagnosis not present

## 2021-05-08 ENCOUNTER — Other Ambulatory Visit: Payer: Self-pay

## 2021-05-08 ENCOUNTER — Ambulatory Visit
Admission: RE | Admit: 2021-05-08 | Discharge: 2021-05-08 | Disposition: A | Discharge status: Home / Self Care | Payer: BC Managed Care – PPO | Source: Ambulatory Visit | Attending: Radiation Oncology | Admitting: Radiation Oncology

## 2021-05-08 DIAGNOSIS — Z510 Encounter for antineoplastic radiation therapy: Secondary | ICD-10-CM | POA: Diagnosis not present

## 2021-05-08 DIAGNOSIS — C20 Malignant neoplasm of rectum: Secondary | ICD-10-CM | POA: Diagnosis not present

## 2021-05-09 ENCOUNTER — Ambulatory Visit
Admission: RE | Admit: 2021-05-09 | Discharge: 2021-05-09 | Disposition: A | Discharge status: Home / Self Care | Payer: BC Managed Care – PPO | Source: Ambulatory Visit | Attending: Radiation Oncology | Admitting: Radiation Oncology

## 2021-05-09 DIAGNOSIS — Z510 Encounter for antineoplastic radiation therapy: Secondary | ICD-10-CM | POA: Diagnosis not present

## 2021-05-09 DIAGNOSIS — C20 Malignant neoplasm of rectum: Secondary | ICD-10-CM | POA: Diagnosis not present

## 2021-05-10 ENCOUNTER — Ambulatory Visit
Admission: RE | Admit: 2021-05-10 | Discharge: 2021-05-10 | Disposition: A | Discharge status: Home / Self Care | Payer: BC Managed Care – PPO | Source: Ambulatory Visit | Attending: Radiation Oncology | Admitting: Radiation Oncology

## 2021-05-10 ENCOUNTER — Encounter: Payer: Self-pay | Admitting: Radiation Oncology

## 2021-05-10 ENCOUNTER — Other Ambulatory Visit: Payer: Self-pay

## 2021-05-10 DIAGNOSIS — Z510 Encounter for antineoplastic radiation therapy: Secondary | ICD-10-CM | POA: Diagnosis not present

## 2021-05-10 DIAGNOSIS — C20 Malignant neoplasm of rectum: Secondary | ICD-10-CM | POA: Diagnosis not present

## 2021-05-16 ENCOUNTER — Inpatient Hospital Stay: Payer: BC Managed Care – PPO

## 2021-05-16 ENCOUNTER — Inpatient Hospital Stay (HOSPITAL_BASED_OUTPATIENT_CLINIC_OR_DEPARTMENT_OTHER): Payer: BC Managed Care – PPO | Admitting: Hematology and Oncology

## 2021-05-16 ENCOUNTER — Other Ambulatory Visit: Payer: Self-pay

## 2021-05-16 VITALS — BP 139/87 | HR 89 | Temp 98.3°F | Resp 18 | Ht 69.0 in | Wt 210.8 lb

## 2021-05-16 DIAGNOSIS — G893 Neoplasm related pain (acute) (chronic): Secondary | ICD-10-CM | POA: Diagnosis not present

## 2021-05-16 DIAGNOSIS — C20 Malignant neoplasm of rectum: Secondary | ICD-10-CM | POA: Diagnosis not present

## 2021-05-16 DIAGNOSIS — R110 Nausea: Secondary | ICD-10-CM

## 2021-05-16 DIAGNOSIS — T451X5A Adverse effect of antineoplastic and immunosuppressive drugs, initial encounter: Secondary | ICD-10-CM

## 2021-05-16 DIAGNOSIS — I10 Essential (primary) hypertension: Secondary | ICD-10-CM | POA: Diagnosis not present

## 2021-05-16 DIAGNOSIS — R198 Other specified symptoms and signs involving the digestive system and abdomen: Secondary | ICD-10-CM | POA: Diagnosis not present

## 2021-05-16 DIAGNOSIS — Z803 Family history of malignant neoplasm of breast: Secondary | ICD-10-CM | POA: Diagnosis not present

## 2021-05-16 DIAGNOSIS — Z79899 Other long term (current) drug therapy: Secondary | ICD-10-CM | POA: Diagnosis not present

## 2021-05-16 LAB — CBC WITH DIFFERENTIAL/PLATELET
Abs Immature Granulocytes: 0.02 10*3/uL (ref 0.00–0.07)
Basophils Absolute: 0 10*3/uL (ref 0.0–0.1)
Basophils Relative: 0 %
Eosinophils Absolute: 0.1 10*3/uL (ref 0.0–0.5)
Eosinophils Relative: 2 %
HCT: 40.5 % (ref 39.0–52.0)
Hemoglobin: 13.8 g/dL (ref 13.0–17.0)
Immature Granulocytes: 0 %
Lymphocytes Relative: 13 %
Lymphs Abs: 0.6 10*3/uL — ABNORMAL LOW (ref 0.7–4.0)
MCH: 27.5 pg (ref 26.0–34.0)
MCHC: 34.1 g/dL (ref 30.0–36.0)
MCV: 80.7 fL (ref 80.0–100.0)
Monocytes Absolute: 0.5 10*3/uL (ref 0.1–1.0)
Monocytes Relative: 10 %
Neutro Abs: 3.3 10*3/uL (ref 1.7–7.7)
Neutrophils Relative %: 75 %
Platelets: 178 10*3/uL (ref 150–400)
RBC: 5.02 MIL/uL (ref 4.22–5.81)
RDW: 21.4 % — ABNORMAL HIGH (ref 11.5–15.5)
WBC: 4.5 10*3/uL (ref 4.0–10.5)
nRBC: 0 % (ref 0.0–0.2)

## 2021-05-16 LAB — CMP (CANCER CENTER ONLY)
ALT: 26 U/L (ref 0–44)
AST: 17 U/L (ref 15–41)
Albumin: 4.3 g/dL (ref 3.5–5.0)
Alkaline Phosphatase: 54 U/L (ref 38–126)
Anion gap: 9 (ref 5–15)
BUN: 16 mg/dL (ref 6–20)
CO2: 26 mmol/L (ref 22–32)
Calcium: 10 mg/dL (ref 8.9–10.3)
Chloride: 105 mmol/L (ref 98–111)
Creatinine: 1.09 mg/dL (ref 0.61–1.24)
GFR, Estimated: >60 mL/min (ref >60)
Glucose, Bld: 157 mg/dL — ABNORMAL HIGH (ref 70–99)
Potassium: 4.8 mmol/L (ref 3.5–5.1)
Sodium: 140 mmol/L (ref 135–145)
Total Bilirubin: 1.1 mg/dL (ref 0.3–1.2)
Total Protein: 7.3 g/dL (ref 6.5–8.1)

## 2021-05-16 LAB — CEA (IN HOUSE-CHCC): CEA (CHCC-In House): 1.31 ng/mL (ref 0.00–5.00)

## 2021-05-16 NOTE — Progress Notes (Signed)
[place] CONSULT NOTE  Patient Care Team: Shirline Frees, MD as PCP - General (Family Medicine) [name], MD as Consulting Physician (Hematology and Oncology) Jonnie Finner, RN (Inactive) as Oncology Nurse Navigator  CHIEF COMPLAINTS/PURPOSE OF CONSULTATION:  New diagnosis of adenocarcinoma rectum, completed concurrent CRT  ASSESSMENT & PLAN:   Adenocarcinoma of rectum [facility]) This is a very pleasant 51 year old male patient with newly diagnosed T3BN0 adenocarcinoma of the rectum, MSS with no evidence of distant metastasis following up with medical oncology for consideration of concurrent chemoradiation.   He has T3N0 or stage IIa adenocarcinoma of the rectum and treatment recommendations from TB suggested concurrent chemotherapy radiation with Xeloda versus 5-fluorouracil followed by consideration for surgery and adjuvant chemotherapy   Given concern for 4 mm perirectal lymph node which is not entirely diagnostic for involvement, we have also discussed total neoadjuvant approach which patient was reluctant given some issues with his work and disability leave.  He wanted to proceed with neoadjuvant chemoradiation followed by surgery and adjuvant chemotherapy because of some logistics with his disability leave.  He started Xeloda on 04/03/2021, last date of treatment 05/10/2021.  Treatment complicated by some mild fatigue and tenesmus otherwise no major complications. Physical examination today without any concerns.  He is scheduled to see Dr. Marcello Moores to discuss surgical recommendations. He will return to clinic for follow-up in a few weeks to discuss adjuvant chemotherapy after completion of surgery. No concern on labs today.  Tenesmus Tenesmus related to the rectal adenocarcinoma and recent radiation.  He has not been using any more hydrocortisone suppositories.   We will continue to monitor  Chemotherapy-induced nausea Mild, can use PRN nausea medications. No  vomiting  Orders Placed This Encounter  Procedures   CBC with Differential/Platelet    Standing Status:   Standing    Number of Occurrences:   22    Standing Expiration Date:   05/16/2022   CMP ([place] only)    Standing Status:   Future    Number of Occurrences:   1    Standing Expiration Date:   05/16/2022   CEA (IN HOUSE-[facility])    Standing Status:   Standing    Number of Occurrences:   3    Standing Expiration Date:   05/16/2022       HISTORY OF PRESENTING ILLNESS:   Steven [name] 50 y.o. male is here because of adenoca rectum completed concurrent CRT.  Interval history  He started on Xeloda 04/03/2021 He completed concurrent chemoradiation on 05/10/2021. Overall he tolerated treatment well except for some tenesmus and fatigue.  No others adverse effects from Xeloda.  No nausea, vomiting, diarrhea or skin rash. He is scheduled to see the surgeon next week for further recommendations.  He understands the need to proceed with adjuvant therapy after completion of surgery. He denies any major complaints today.  Rest of the pertinent 10 point ROS reviewed and negative.  MEDICAL HISTORY:  Past Medical History:  Diagnosis Date   Anxiety    Asthma    Depression    Diabetes mellitus without complication (HCC)    HLD (hyperlipidemia)    Hypertension    Kidney stones    OSA (obstructive sleep apnea)    CPAP    SURGICAL HISTORY: Past Surgical History:  Procedure Laterality Date   APPENDECTOMY     CYST EXCISION     left neck   WISDOM TOOTH EXTRACTION      SOCIAL HISTORY: Social History  Socioeconomic History   Marital status: Married    Spouse name: Not on file   Number of children: 3   Years of education: Not on file   Highest education level: Not on file  Occupational History   Occupation: Gaffer  Tobacco Use   Smoking status: Never   Smokeless tobacco: Never  Vaping Use   Vaping Use: Never used  Substance and Sexual Activity   Alcohol  use: Yes    Comment: 1 beer every 3-4 months   Drug use: Never   Sexual activity: Not on file  Other Topics Concern   Not on file  Social History Narrative   Not on file   Social Determinants of Health   Financial Resource Strain: Not on file  Food Insecurity: Not on file  Transportation Needs: Not on file  Physical Activity: Not on file  Stress: Not on file  Social Connections: Not on file  Intimate Partner Violence: Not on file    FAMILY HISTORY: Family History  Problem Relation Age of Onset   Diabetes Mellitus II Mother    Breast cancer Mother 34   Thrombophlebitis Father    Breast cancer Maternal Grandmother 48   Diabetes Maternal Grandmother    Breast cancer Maternal Aunt 60   Diabetes Maternal Aunt    Hypertension Paternal Grandmother    Colon cancer Neg Hx    Esophageal cancer Neg Hx    Inflammatory bowel disease Neg Hx    Liver disease Neg Hx    Pancreatic cancer Neg Hx    Rectal cancer Neg Hx    Stomach cancer Neg Hx     ALLERGIES:  is allergic to contrast media [iodinated diagnostic agents].  MEDICATIONS:  Current Outpatient Medications  Medication Sig Dispense Refill   acetaminophen (TYLENOL) 500 MG tablet Take 1,000 mg by mouth every 6 (six) hours as needed for mild pain.     albuterol (VENTOLIN HFA) 108 (90 Base) MCG/ACT inhaler Inhale 2 puffs into the lungs every 4 (four) hours as needed for wheezing.     ALPRAZolam (XANAX) 0.25 MG tablet Take 0.25 mg by mouth daily as needed for anxiety or sleep.     blood glucose meter kit and supplies KIT Dispense based on patient and insurance preference. Use up to four times daily as directed. (FOR ICD-9 250.00, 250.01). 1 each 0   Cinnamon 500 MG capsule See admin instructions. 1000 mg daily     fenofibrate 160 MG tablet Take 160 mg by mouth daily.     Ferrous Sulfate (IRON) 325 (65 Fe) MG TABS Take 325 mg by mouth daily.     hydrocortisone (ANUSOL-HC) 25 MG suppository Place 1 suppository (25 mg total) rectally  2 (two) times daily. 12 suppository 2   irbesartan (AVAPRO) 150 MG tablet Take 150 mg by mouth daily.     meloxicam (MOBIC) 15 MG tablet Take 15 mg by mouth at bedtime.      ondansetron (ZOFRAN) 8 MG tablet Take 1 tablet (8 mg total) by mouth 2 (two) times daily as needed (Nausea or vomiting). 30 tablet 1   polycarbophil (FIBERCON) 625 MG tablet Take 625 mg by mouth daily. 2 tabs per day     predniSONE (DELTASONE) 50 MG tablet Take one 50 mg tablet by mouth 13 hours prior to scan, one 50 mg tablet by mouth 7 hours prior to scan and one 50 mg tablet 1 hour before scan. 3 tablet 0   prochlorperazine (COMPAZINE) 10 MG tablet  Take 1 tablet (10 mg total) by mouth every 6 (six) hours as needed (Nausea or vomiting). 30 tablet 1   tetrahydrozoline-zinc (VISINE-AC) 0.05-0.25 % ophthalmic solution Place 2 drops into both eyes as needed (allergy).     XARELTO 20 MG TABS tablet Take 20 mg by mouth daily.     XIGDUO XR 01-999 MG TB24 Take 2 tablets by mouth daily.     capecitabine (XELODA) 500 MG tablet Take Monday through Friday during radiation. Take 4 tablets in AM and 3 tablets in PM. Taken within 30 minutes after meals. 150 tablet 0   tamsulosin (FLOMAX) 0.4 MG CAPS capsule Take 1 capsule (0.4 mg total) by mouth daily. (Patient not taking: Reported on 05/16/2021) 30 capsule 1   No current facility-administered medications for this visit.    PHYSICAL EXAMINATION:  ECOG PERFORMANCE STATUS: 0 - Asymptomatic  Vitals:   05/16/21 1220  BP: 139/87  Pulse: 89  Resp: 18  Temp: 98.3 F (36.8 C)  SpO2: 100%   Filed Weights   05/16/21 1220  Weight: 210 lb 12.8 oz (95.6 kg)   GENERAL:alert, no distress and comfortable, obese SKIN: skin color, texture, turgor are normal, no rashes or significant lesions EYES: normal, conjunctiva are pink and non-injected, sclera clear OROPHARYNX:no exudate, no erythema and lips, buccal mucosa, and tongue normal  NECK: supple, thyroid normal size, non-tender, without  nodularity LYMPH:  no palpable lymphadenopathy in the cervical, axillary or inguinal LUNGS: clear to auscultation and percussion with normal breathing effort HEART: regular rate & rhythm and no murmurs and no lower extremity edema ABDOMEN:abdomen soft, non-tender and normal bowel sounds Musculoskeletal:no cyanosis of digits and no clubbing  PSYCH: alert & oriented x 3 with fluent speech NEURO: no focal motor/sensory deficits  LABORATORY DATA:  I have reviewed the data as listed Lab Results  Component Value Date   WBC 4.5 05/16/2021   HGB 13.8 05/16/2021   HCT 40.5 05/16/2021   MCV 80.7 05/16/2021   PLT 178 05/16/2021     Chemistry      Component Value Date/Time   NA 140 05/16/2021 1313   K 4.8 05/16/2021 1313   CL 105 05/16/2021 1313   CO2 26 05/16/2021 1313   BUN 16 05/16/2021 1313   CREATININE 1.09 05/16/2021 1313      Component Value Date/Time   CALCIUM 10.0 05/16/2021 1313   ALKPHOS 54 05/16/2021 1313   AST 17 05/16/2021 1313   ALT 26 05/16/2021 1313   BILITOT 1.1 05/16/2021 1313     Labs today reviewed, no concerns.  RADIOGRAPHIC STUDIES: I have personally reviewed the radiological images as listed and agreed with the findings in the report. No results found.  All questions were answered. The patient knows to call the clinic with any problems, questions or concerns.    [name], MD 05/17/2021 10:21 AM

## 2021-05-17 ENCOUNTER — Encounter: Payer: Self-pay | Admitting: Hematology and Oncology

## 2021-05-17 NOTE — Assessment & Plan Note (Signed)
Tenesmus related to the rectal adenocarcinoma and recent radiation.  He has not been using any more hydrocortisone suppositories.   We will continue to monitor

## 2021-05-17 NOTE — Assessment & Plan Note (Signed)
This is a very pleasant 50 year old male patient with newly diagnosed T3BN0 adenocarcinoma of the rectum, MSS with no evidence of distant metastasis following up with medical oncology for consideration of concurrent chemoradiation.   He has T3N0 or stage IIa adenocarcinoma of the rectum and treatment recommendations from TB suggested concurrent chemotherapy radiation with Xeloda versus 5-fluorouracil followed by consideration for surgery and adjuvant chemotherapy   Given concern for 4 mm perirectal lymph node which is not entirely diagnostic for involvement, we have also discussed total neoadjuvant approach which patient was reluctant given some issues with his work and disability leave.  He wanted to proceed with neoadjuvant chemoradiation followed by surgery and adjuvant chemotherapy because of some logistics with his disability leave.  He started Xeloda on 04/03/2021, last date of treatment 05/10/2021.  Treatment complicated by some mild fatigue and tenesmus otherwise no major complications. Physical examination today without any concerns.  He is scheduled to see Dr. Marcello Moores to discuss surgical recommendations. He will return to clinic for follow-up in a few weeks to discuss adjuvant chemotherapy after completion of surgery. No concern on labs today.

## 2021-05-17 NOTE — Assessment & Plan Note (Signed)
Mild, can use PRN nausea medications. No vomiting

## 2021-05-20 ENCOUNTER — Encounter: Payer: Self-pay | Admitting: Hematology and Oncology

## 2021-05-20 ENCOUNTER — Ambulatory Visit: Payer: Self-pay | Admitting: General Surgery

## 2021-05-20 DIAGNOSIS — C20 Malignant neoplasm of rectum: Secondary | ICD-10-CM | POA: Diagnosis not present

## 2021-05-20 NOTE — H&P (Signed)
PROVIDER:  Monico Blitz, MD  MRN: [id number] DOB: 1970-12-02 DATE OF ENCOUNTER: 05/20/2021 Subjective   Chief Complaint: No chief complaint on file.     History of Present Illness: [name] is a 50 y.o. male who is seen today as an office follow up for Rectal Cancer.  Patient was diagnosed with a recurrent PE and underwent a work-up with hematology.  CT scan showed a mass in the rectum.  He underwent colonoscopy in early June 2022.  This showed a distal rectal mass.  Biopsies showed adenocarcinoma.  CT scans of the chest abdomen pelvis showed no signs of metastatic disease but a possible enlarged perirectal lymph node.  MRI shows a 4 mm perirectal lymph node and a T3b posterior rectal tumor approximately 2 cm from the anal verge.  Patient has noticed some rectal bleeding but is otherwise asymptomatic.  Past surgical history is significant for open appendectomy as a teenager.  He completed neoadjuvant radiation therapy on May 10, 2021.   Past Medical History:  Diagnosis Date   Anxiety    Asthma    Depression    Diabetes mellitus without complication (HCC)    HLD (hyperlipidemia)    Hypertension    Kidney stones    OSA (obstructive sleep apnea)    CPAP   Past Surgical History:  Procedure Laterality Date   APPENDECTOMY     CYST EXCISION     left neck   WISDOM TOOTH EXTRACTION     Family History  Problem Relation Age of Onset   Diabetes Mellitus II Mother    Breast cancer Mother 44   Thrombophlebitis Father    Breast cancer Maternal Grandmother 30   Diabetes Maternal Grandmother    Breast cancer Maternal Aunt 60   Diabetes Maternal Aunt    Hypertension Paternal Grandmother    Colon cancer Neg Hx    Esophageal cancer Neg Hx    Inflammatory bowel disease Neg Hx    Liver disease Neg Hx    Pancreatic cancer Neg Hx    Rectal cancer Neg Hx    Stomach cancer Neg Hx    Social History   Socioeconomic History   Marital status: Married    Spouse name: Not on  file   Number of children: 3   Years of education: Not on file   Highest education level: Not on file  Occupational History   Occupation: Gaffer  Tobacco Use   Smoking status: Never   Smokeless tobacco: Never  Vaping Use   Vaping Use: Never used  Substance and Sexual Activity   Alcohol use: Yes    Comment: 1 beer every 3-4 months   Drug use: Never   Sexual activity: Not on file  Other Topics Concern   Not on file  Social History Narrative   Not on file   Social Determinants of Health   Financial Resource Strain: Not on file  Food Insecurity: Not on file  Transportation Needs: Not on file  Physical Activity: Not on file  Stress: Not on file  Social Connections: Not on file  Intimate Partner Violence: Not on file    Current Outpatient Medications:    acetaminophen (TYLENOL) 500 MG tablet, Take 1,000 mg by mouth every 6 (six) hours as needed for mild pain., Disp: , Rfl:    albuterol (VENTOLIN HFA) 108 (90 Base) MCG/ACT inhaler, Inhale 2 puffs into the lungs every 4 (four) hours as needed for wheezing., Disp: , Rfl:    ALPRAZolam Duanne Moron)  0.25 MG tablet, Take 0.25 mg by mouth daily as needed for anxiety or sleep., Disp: , Rfl:    blood glucose meter kit and supplies KIT, Dispense based on patient and insurance preference. Use up to four times daily as directed. (FOR ICD-9 250.00, 250.01)., Disp: 1 each, Rfl: 0   capecitabine (XELODA) 500 MG tablet, Take Monday through Friday during radiation. Take 4 tablets in AM and 3 tablets in PM. Taken within 30 minutes after meals., Disp: 150 tablet, Rfl: 0   Cinnamon 500 MG capsule, See admin instructions. 1000 mg daily, Disp: , Rfl:    fenofibrate 160 MG tablet, Take 160 mg by mouth daily., Disp: , Rfl:    Ferrous Sulfate (IRON) 325 (65 Fe) MG TABS, Take 325 mg by mouth daily., Disp: , Rfl:    hydrocortisone (ANUSOL-HC) 25 MG suppository, Place 1 suppository (25 mg total) rectally 2 (two) times daily., Disp: 12 suppository, Rfl:  2   irbesartan (AVAPRO) 150 MG tablet, Take 150 mg by mouth daily., Disp: , Rfl:    meloxicam (MOBIC) 15 MG tablet, Take 15 mg by mouth at bedtime. , Disp: , Rfl:    ondansetron (ZOFRAN) 8 MG tablet, Take 1 tablet (8 mg total) by mouth 2 (two) times daily as needed (Nausea or vomiting)., Disp: 30 tablet, Rfl: 1   polycarbophil (FIBERCON) 625 MG tablet, Take 625 mg by mouth daily. 2 tabs per day, Disp: , Rfl:    predniSONE (DELTASONE) 50 MG tablet, Take one 50 mg tablet by mouth 13 hours prior to scan, one 50 mg tablet by mouth 7 hours prior to scan and one 50 mg tablet 1 hour before scan., Disp: 3 tablet, Rfl: 0   prochlorperazine (COMPAZINE) 10 MG tablet, Take 1 tablet (10 mg total) by mouth every 6 (six) hours as needed (Nausea or vomiting)., Disp: 30 tablet, Rfl: 1   tamsulosin (FLOMAX) 0.4 MG CAPS capsule, Take 1 capsule (0.4 mg total) by mouth daily. (Patient not taking: Reported on 05/16/2021), Disp: 30 capsule, Rfl: 1   tetrahydrozoline-zinc (VISINE-AC) 0.05-0.25 % ophthalmic solution, Place 2 drops into both eyes as needed (allergy)., Disp: , Rfl:    XARELTO 20 MG TABS tablet, Take 20 mg by mouth daily., Disp: , Rfl:    XIGDUO XR 01-999 MG TB24, Take 2 tablets by mouth daily., Disp: , Rfl:   Review of Systems - General ROS: negative for - chills or fever Respiratory ROS: no cough, shortness of breath, or wheezing Cardiovascular ROS: no chest pain or dyspnea on exertion Gastrointestinal ROS: no abdominal pain, change in bowel habits, or black or bloody stools  Objective:    Vitals:   05/20/21 1408  Pulse: 64  Temp: 36.9 C (98.5 F)  SpO2: 98%  Weight: 95.3 kg (210 lb 3.2 oz)  Height: 177.8 cm (5' 10" )      General appearance - alert, well appearing, and in no distress Chest - clear to auscultation, no wheezes, rales or rhonchi, symmetric air entry Heart - normal rate and regular rhythm Abdomen - soft, nontender, nondistended, no masses or organomegaly Rectal -mass approximately  2 to 3 cm from the top of the anal canal on digital rectal exam   Labs, Imaging and Diagnostic Testing:  MRI 03/15/21  IMPRESSION: Rectal adenocarcinoma T stage: T3b   Rectal adenocarcinoma N stage:  0   Distance from tumor to the internal anal sphincter is 2.2 cm.  Assessment and Plan:  Diagnoses and all orders for this visit:  Malignant  neoplasm of rectum (CMS-HCC)  We have discussed surgery in detail.  We will plan on performing this October 10 through November 4.  We discussed that he would need a coloanal anastomosis and a diverting loop ileostomy.  We discussed the possible need for adjuvant chemotherapy and then closure of the loop ileostomy after that.  All questions were answered.  We discussed the entire surgery in detail.  The surgery and anatomy were described to the patient as well as the risks of surgery and the possible complications.  These include: Bleeding, deep abdominal infections and possible wound complications such as hernia and infection, damage to adjacent structures, leak of surgical connections, which can lead to other surgeries and possibly an ostomy, possible need for other procedures, such as abscess drains in radiology, possible prolonged hospital stay, possible diarrhea from removal of part of the colon, possible constipation from narcotics, possible bowel, bladder or sexual dysfunction if having rectal surgery, prolonged fatigue/weakness or appetite loss, possible early recurrence of of disease, possible complications of their medical problems such as heart disease or arrhythmias or lung problems, death (less than 1%). I believe the patient understands and wishes to proceed with the surgery.

## 2021-05-20 NOTE — Progress Notes (Signed)
                                                                                                                                                             Patient Name: Steven [name] MRN: [id number] DOB: [birth year] Referring Physician: Shirline Frees (Profile Not Attached) Date of Service: 05/10/2021 [facility], [place]                                                        End Of Treatment Note  Diagnoses: C20-Malignant neoplasm of rectum  Cancer Staging: Stage IIA cT3bN0, adenocarcinoma of the rectum.   Intent: Curative  Radiation Treatment Dates: 04/03/2021 through 05/10/2021 Site Technique Total Dose (Gy) Dose per Fx (Gy) Completed Fx Beam Energies  Rectum: Rectum 3D 45/45 1.8 25/25 10X, 15X  Rectum: Rectum_Bst 3D 5.4/5.4 1.8 3/3 15X   Narrative: The patient tolerated radiation therapy relatively well. He did have frequent bowel movements and blood with wiping toward the end of treatment.  Plan: The patient will receive a call in about one month from the radiation oncology department. He will continue follow up with Dr. Chryl Heck as well as Dr. Marcello Moores.  ________________________________________________    Carola Rhine, [facility]

## 2021-06-10 ENCOUNTER — Other Ambulatory Visit: Payer: Self-pay | Admitting: Hematology and Oncology

## 2021-06-10 ENCOUNTER — Telehealth: Payer: Self-pay

## 2021-06-10 NOTE — Telephone Encounter (Signed)
Hematology clearance faxed to [facility].

## 2021-06-10 NOTE — Progress Notes (Signed)
To whom so every it may concern,  Steven [name] was diagnosed with PE in Oct 2021 and remained on anticoagulation since then given interim diagnosis of rectal cancer. He is now going be scheduled for colorectal surgery as part of definitive treatment for his rectal cancer in October 2022 Since it has almost been a year from diagnosis of PE, he can safely discontinue anticoagulation in preparation for rectal adenocarcinoma when ever deemed necessary by surgery team. There remains a small risk of DVT/PE while he is off of anticoagulation which has been explained to him but his benefits of surgery overweigh his risks. He will RTC with [place] after the surgery and we can consider discussing about re initiation of anticoagulation at that time if deemed necessary.  Steven [name]

## 2021-06-11 ENCOUNTER — Ambulatory Visit: Payer: BC Managed Care – PPO | Admitting: Hematology and Oncology

## 2021-06-11 DIAGNOSIS — G4733 Obstructive sleep apnea (adult) (pediatric): Secondary | ICD-10-CM | POA: Diagnosis not present

## 2021-06-11 DIAGNOSIS — I10 Essential (primary) hypertension: Secondary | ICD-10-CM | POA: Diagnosis not present

## 2021-06-11 DIAGNOSIS — E7800 Pure hypercholesterolemia, unspecified: Secondary | ICD-10-CM | POA: Diagnosis not present

## 2021-06-11 DIAGNOSIS — E1165 Type 2 diabetes mellitus with hyperglycemia: Secondary | ICD-10-CM | POA: Diagnosis not present

## 2021-06-14 DIAGNOSIS — G4733 Obstructive sleep apnea (adult) (pediatric): Secondary | ICD-10-CM | POA: Diagnosis not present

## 2021-06-17 ENCOUNTER — Ambulatory Visit
Admission: RE | Admit: 2021-06-17 | Discharge: 2021-06-17 | Disposition: A | Discharge status: Home / Self Care | Payer: BC Managed Care – PPO | Source: Ambulatory Visit | Attending: Hematology and Oncology | Admitting: Hematology and Oncology

## 2021-06-17 DIAGNOSIS — C20 Malignant neoplasm of rectum: Secondary | ICD-10-CM

## 2021-06-17 NOTE — Progress Notes (Signed)
  Radiation Oncology         [phone]) [phone] ________________________________  Name: Steven [name] MRN: [id number]  Date of Service: 06/17/2021  DOB: 02-01-1971  Post Treatment Telephone Note  Diagnosis:   Stage IIA cT3bN0, adenocarcinoma of the rectum.   Interval Since Last Radiation:  6 weeks   04/03/2021 through 05/10/2021 Site Technique Total Dose (Gy) Dose per Fx (Gy) Completed Fx Beam Energies  Rectum: Rectum 3D 45/45 1.8 25/25 10X, 15X  Rectum: Rectum_Bst 3D 5.4/5.4 1.8 3/3 15X    Narrative:  The patient was contacted today for routine follow-up. During treatment he did very well with radiotherapy and did not have significant desquamation. He reports he is back to more formed stools and less cystitis. The patient is scheduled for robotic LAR on 07/17/21 with Dr. Marcello Moores.  Impression/Plan: 1. Stage IIA cT3bN0, adenocarcinoma of the rectum. The patient has been doing well since completion of radiotherapy. We discussed that we would be happy to continue to follow him as needed, but he will also continue to follow up with Dr. Chryl Heck in medical oncology and Dr. Marcello Moores.      Carola Rhine, PAC

## 2021-06-25 ENCOUNTER — Encounter (HOSPITAL_COMMUNITY)
Admission: RE | Admit: 2021-06-25 | Discharge: 2021-06-25 | Disposition: A | Discharge status: Home / Self Care | Payer: BC Managed Care – PPO | Source: Ambulatory Visit | Attending: General Surgery | Admitting: General Surgery

## 2021-06-25 ENCOUNTER — Other Ambulatory Visit: Payer: Self-pay

## 2021-06-25 DIAGNOSIS — Z7189 Other specified counseling: Secondary | ICD-10-CM

## 2021-06-25 DIAGNOSIS — Z01812 Encounter for preprocedural laboratory examination: Secondary | ICD-10-CM | POA: Insufficient documentation

## 2021-06-25 NOTE — Progress Notes (Signed)
[facility]   Reason for visit:  To have anterior resection due to rectal cancer HPI:  Rectal cancer with anterior resection ROS  Review of Systems  Skin:        Scar in RLQ from appendectomy, creasing at 3 and 9 o'clock.   Psychiatric/Behavioral:  The patient is nervous/anxious.        Anxious regarding upcoming surgery/lifestyle changes within family.  Care responsibilities, etc.   All other systems reviewed and are negative. Vital signs:  BP (!) 148/86 (BP Location: Right Arm)   Pulse 93   Temp (!) 97.5 F (36.4 C) (Oral)   Resp 18   SpO2 100%  Exam:  Physical Exam Abdominal:     Palpations: Abdomen is soft.     Comments: Marked in RUQ for ileostomy  Creasing at RLQ (appendectomy scar) has created creasing and would not be an optimal pouching surface. Sagging skin below umbilicus and pendulous abdomen necessitate higher marking within patient field of view.     [place] Nurse requested for preoperative stoma site marking  Discussed surgical procedure and stoma creation with patient and family.  Explained role of the [place] nurse team.  Provided the patient with educational booklet and provided samples of pouching options.  Answered patient and family questions. Patient has daughter that had an ostomy years ago.  They are familiar with ostomy care.  Wife and patient are tearful and anxious regarding procedure.  Patient helps to lift his 80 pound disabled daughter daily.  We discuss lifting restrictions after surgery and that they will need to implement alternate plans for transfers from [place], etc.  Suggest a lift if possible.   Examined patient lying, sitting, and standing in order to place the marking in the patient's visual field, away from any creases or abdominal contour issues and within the rectus muscle.  Below umbilicus, patient has pendulous abdomen and sagging skin from weight loss.  He cannot see stoma if placed in this lower area.  I will mark higher in a quadrant  that patient can visualize and he feels comfortable with.     Marked for ileostomy in the RLQ  5 cm to the right of the umbilicus and  6 cm above the umbilicus.     Patient's abdomen cleansed with CHG wipes at site markings, allowed to air dry prior to marking.Covered mark with thin film transparent dressing to preserve mark until date of surgery. Sent home with reading materials and indelible marker to replace mark if washed/worn off.   [place] Nurse team will follow up with patient after surgery for continue ostomy care and teaching.     Impression/dx  Presurgical marking Discussion  See above Plan  Follow up postoperatively     Visit time: 60 minutes.   Domenic Moras FNP-BC

## 2021-07-01 NOTE — Discharge Instructions (Signed)
Follow up post operatively for ongoing education/supply needs.

## 2021-07-04 NOTE — Patient Instructions (Addendum)
DUE TO COVID-19 ONLY ONE VISITOR IS ALLOWED TO COME WITH YOU AND STAY IN THE WAITING ROOM ONLY DURING PRE OP AND PROCEDURE DAY OF SURGERY IF YOU ARE GOING HOME AFTER SURGERY. IF YOU ARE SPENDING THE NIGHT 2 PEOPLE MAY VISIT WITH YOU IN YOUR PRIVATE ROOM AFTER SURGERY UNTIL VISITING  HOURS ARE OVER AT 8:00 PM AND 1 VISITOR CAN SPEND THE NIGHT.   YOU NEED TO HAVE A COVID 19 TEST ON_10/17__THIS TEST MUST BE DONE BEFORE SURGERY,  COVID TESTING SITE  IS LOCATED AT [place], [place]. REMAIN IN YOUR CAR THIS IS A DRIVE UP TEST. AFTER YOUR COVID TEST PLEASE WEAR A MASK OUT IN PUBLIC AND SOCIAL DISTANCE AND [place] YOUR HANDS FREQUENTLY, ALSO ASK ALL YOUR CLOSE CONTACT PERSONS TO WEAR A MASK AND SOCIAL DISTANCE AND [place] THEIR HANDS FREQUENTLY ALSO.               Steven [name]     Your procedure is scheduled on: 07/17/21   Report to [facility] Main  Entrance   Report to admitting at  10:45 AM     Call this number if you have problems the morning of surgery [phone]    DRINK 2 PRESURGERY ENSURE DRINKS THE NIGHT BEFORE SURGERY AT 10:00 PM .  NO SOLIDS AFTER MIDNIGHT THE DAY PRIOR TO THE SURGERY.   NOTHING BY MOUTH EXCEPT CLEAR LIQUIDS UNTIL THREE HOURS PRIOR TO SCHEDULED SURGERY. PLEASE FINISH PRESURGERY ENSURE DRINK PER SURGEON ORDER 3 HOURS PRIOR TO SCHEDULED SURGERY TIME WHICH NEEDS TO BE COMPLETED AT __10:00 am.  CLEAR LIQUID DIET   Foods Allowed                                                                     Foods Excluded                                                                                        liquids that you cannot  Plain Jell-O any favor except red or purple                                           see through such as: Fruit ices (not with fruit pulp)                                     milk, soups, orange juice  Iced Popsicles                                    All solid food Carbonated beverages, regular and diet  Cranberry, grape and apple juices Sports drinks like Gatorade Lightly seasoned clear broth or consume(fat free) Sugar     BRUSH YOUR TEETH MORNING OF SURGERY AND RINSE YOUR MOUTH OUT, NO CHEWING GUM CANDY OR MINTS.   Use your inhaler and  bring it with you. Bring your mask and tubing   Take these medicines the morning of surgery with A SIP OF WATER: none   Stop taking _Xarelto__________on _stop 2 days prior_________as instructed by ___Dr. Iruku__________.  S.    How to Manage Your Diabetes Before and After Surgery  Why is it important to control my blood sugar before and after surgery? Improving blood sugar levels before and after surgery helps healing and can limit problems. A way of improving blood sugar control is eating a healthy diet by:  Eating less sugar and carbohydrates  Increasing activity/exercise  Talking with your doctor about reaching your blood sugar goals High blood sugars (greater than 180 mg/dL) can raise your risk of infections and slow your recovery, so you will need to focus on controlling your diabetes during the weeks before surgery. Make sure that the doctor who takes care of your diabetes knows about your planned surgery including the date and location.  How do I manage my blood sugar before surgery? Check your blood sugar at least 4 times a day, starting 2 days before surgery, to make sure that the level is not too high or low. Check your blood sugar the morning of your surgery when you wake up and every 2 hours until you get to the Short Stay unit. If your blood sugar is less than 70 mg/dL, you will need to treat for low blood sugar: Do not take insulin. Treat a low blood sugar (less than 70 mg/dL) with  cup of clear juice (cranberry or apple), 4 glucose tablets, OR glucose gel. Recheck blood sugar in 15 minutes after treatment (to make sure it is greater than 70 mg/dL). If your blood sugar is not greater than 70 mg/dL on recheck, call  [phone] for further instructions. Report your blood sugar to the short stay nurse when you get to Short Stay.  If you are admitted to the hospital after surgery: Your blood sugar will be checked by the staff and you will probably be given insulin after surgery (instead of oral diabetes medicines) to make sure you have good blood sugar levels. The goal for blood sugar control after surgery is 80-180 mg/dL.   WHAT DO I DO ABOUT MY DIABETES MEDICATION?  Do not take oral diabetes medicines (pills) the morning of surgery.                                          You may not have any metal on your body including              piercings  Do not wear jewelry, lotions, powders or  deodorant                          Men may shave face and neck.   Do not bring valuables to the hospital. [place].  Contacts, dentures or bridgework may not be worn into surgery.  Leave suitcase in the car. After surgery it may  be brought to your room.      Special Instructions: N/A              Please read over the following fact sheets you were given: _____________________________________________________________________             [facility] - Preparing for Surgery Before surgery, you can play an important role.  Because skin is not sterile, your skin needs to be as free of germs as possible.  You can reduce the number of germs on your skin by washing with CHG (chlorahexidine gluconate) soap before surgery.  CHG is an antiseptic cleaner which kills germs and bonds with the skin to continue killing germs even after washing. Please DO NOT use if you have an allergy to CHG or antibacterial soaps.  If your skin becomes reddened/irritated stop using the CHG and inform your nurse when you arrive at Short Stay.  You may shave your face/neck. Please follow these instructions carefully:  1.  Shower with CHG Soap the night before surgery and the  morning of  Surgery.  2.  If you choose to wash your hair, wash your hair first as usual with your  normal  shampoo.  3.  After you shampoo, rinse your hair and body thoroughly to remove the  shampoo.                            4.  Use CHG as you would any other liquid soap.  You can apply chg directly  to the skin and wash                       Gently with a scrungie or clean washcloth.  5.  Apply the CHG Soap to your body ONLY FROM THE NECK DOWN.   Do not use on face/ open                           Wound or open sores. Avoid contact with eyes, ears mouth and genitals (private parts).                       Wash face,  Genitals (private parts) with your normal soap.             6.  Wash thoroughly, paying special attention to the area where your surgery  will be performed.  7.  Thoroughly rinse your body with warm water from the neck down.  8.  DO NOT shower/wash with your normal soap after using and rinsing off  the CHG Soap.                9.  Pat yourself dry with a clean towel.            10.  Wear clean pajamas.            11.  Place clean sheets on your bed the night of your first shower and do not  sleep with pets. Day of Surgery : Do not apply any lotions/deodorants the morning of surgery.  Please wear clean clothes to the hospital/surgery center.  FAILURE TO FOLLOW THESE INSTRUCTIONS MAY RESULT IN THE CANCELLATION OF YOUR SURGERY PATIENT SIGNATURE_________________________________  NURSE SIGNATURE__________________________________  ________________________________________________________________________   Steven [name]  An incentive spirometer is a tool that can help keep your lungs clear and active. This tool measures how well you  are filling your lungs with each breath. Taking long deep breaths may help reverse or decrease the chance of developing breathing (pulmonary) problems (especially infection) following: A long period of time when you are unable to move or be active. BEFORE  THE PROCEDURE  If the spirometer includes an indicator to show your best effort, your nurse or respiratory therapist will set it to a desired goal. If possible, sit up straight or lean slightly forward. Try not to slouch. Hold the incentive spirometer in an upright position. INSTRUCTIONS FOR USE  Sit on the edge of your bed if possible, or sit up as far as you can in bed or on a chair. Hold the incentive spirometer in an upright position. Breathe out normally. Place the mouthpiece in your mouth and seal your lips tightly around it. Breathe in slowly and as deeply as possible, raising the piston or the ball toward the top of the column. Hold your breath for 3-5 seconds or for as long as possible. Allow the piston or ball to fall to the bottom of the column. Remove the mouthpiece from your mouth and breathe out normally. Rest for a few seconds and repeat Steps 1 through 7 at least 10 times every 1-2 hours when you are awake. Take your time and take a few normal breaths between deep breaths. The spirometer may include an indicator to show your best effort. Use the indicator as a goal to work toward during each repetition. After each set of 10 deep breaths, practice coughing to be sure your lungs are clear. If you have an incision (the cut made at the time of surgery), support your incision when coughing by placing a pillow or rolled up towels firmly against it. Once you are able to get out of bed, walk around indoors and cough well. You may stop using the incentive spirometer when instructed by your caregiver.  RISKS AND COMPLICATIONS Take your time so you do not get dizzy or light-headed. If you are in pain, you may need to take or ask for pain medication before doing incentive spirometry. It is harder to take a deep breath if you are having pain. AFTER USE Rest and breathe slowly and easily. It can be helpful to keep track of a log of your progress. Your caregiver can provide you with a simple  table to help with this. If you are using the spirometer at home, follow these instructions: [place] IF:  You are having difficultly using the spirometer. You have trouble using the spirometer as often as instructed. Your pain medication is not giving enough relief while using the spirometer. You develop fever of 100.5 F (38.1 C) or higher. SEEK IMMEDIATE MEDICAL CARE IF:  You cough up bloody sputum that had not been present before. You develop fever of 102 F (38.9 C) or greater. You develop worsening pain at or near the incision site. MAKE SURE YOU:  Understand these instructions. Will watch your condition. Will get help right away if you are not doing well or get worse. Document Released: 01/26/2007 Document Revised: 12/08/2011 Document Reviewed: 03/29/2007 [facility] Patient Information 2014 [place], [place].   ________________________________________________________________________

## 2021-07-05 ENCOUNTER — Encounter (HOSPITAL_COMMUNITY)
Admission: RE | Admit: 2021-07-05 | Discharge: 2021-07-05 | Disposition: A | Discharge status: Home / Self Care | Payer: BC Managed Care – PPO | Source: Ambulatory Visit | Attending: General Surgery | Admitting: General Surgery

## 2021-07-05 ENCOUNTER — Other Ambulatory Visit: Payer: Self-pay

## 2021-07-05 ENCOUNTER — Encounter (HOSPITAL_COMMUNITY): Payer: Self-pay

## 2021-07-05 DIAGNOSIS — G4733 Obstructive sleep apnea (adult) (pediatric): Secondary | ICD-10-CM | POA: Insufficient documentation

## 2021-07-05 DIAGNOSIS — E119 Type 2 diabetes mellitus without complications: Secondary | ICD-10-CM | POA: Diagnosis not present

## 2021-07-05 DIAGNOSIS — I10 Essential (primary) hypertension: Secondary | ICD-10-CM | POA: Insufficient documentation

## 2021-07-05 DIAGNOSIS — Z01818 Encounter for other preprocedural examination: Secondary | ICD-10-CM | POA: Diagnosis not present

## 2021-07-05 DIAGNOSIS — Z86711 Personal history of pulmonary embolism: Secondary | ICD-10-CM | POA: Diagnosis not present

## 2021-07-05 DIAGNOSIS — Z791 Long term (current) use of non-steroidal anti-inflammatories (NSAID): Secondary | ICD-10-CM | POA: Insufficient documentation

## 2021-07-05 DIAGNOSIS — C20 Malignant neoplasm of rectum: Secondary | ICD-10-CM | POA: Insufficient documentation

## 2021-07-05 DIAGNOSIS — Z79899 Other long term (current) drug therapy: Secondary | ICD-10-CM | POA: Insufficient documentation

## 2021-07-05 HISTORY — DX: Personal history of urinary calculi: Z87.442

## 2021-07-05 HISTORY — DX: Peripheral vascular disease, unspecified: I73.9

## 2021-07-05 HISTORY — DX: Myoneural disorder, unspecified: G70.9

## 2021-07-05 LAB — HEMOGLOBIN A1C
Hgb A1c MFr Bld: 8 % — ABNORMAL HIGH (ref 4.8–5.6)
Mean Plasma Glucose: 182.9 mg/dL

## 2021-07-05 LAB — BASIC METABOLIC PANEL
Anion gap: 11 (ref 5–15)
BUN: 18 mg/dL (ref 6–20)
CO2: 24 mmol/L (ref 22–32)
Calcium: 9.5 mg/dL (ref 8.9–10.3)
Chloride: 100 mmol/L (ref 98–111)
Creatinine, Ser: 0.97 mg/dL (ref 0.61–1.24)
GFR, Estimated: >60 mL/min (ref >60)
Glucose, Bld: 268 mg/dL — ABNORMAL HIGH (ref 70–99)
Potassium: 4.7 mmol/L (ref 3.5–5.1)
Sodium: 135 mmol/L (ref 135–145)

## 2021-07-05 LAB — CBC
HCT: 46.2 % (ref 39.0–52.0)
Hemoglobin: 15.3 g/dL (ref 13.0–17.0)
MCH: 28 pg (ref 26.0–34.0)
MCHC: 33.1 g/dL (ref 30.0–36.0)
MCV: 84.6 fL (ref 80.0–100.0)
Platelets: 175 10*3/uL (ref 150–400)
RBC: 5.46 MIL/uL (ref 4.22–5.81)
RDW: 15.2 % (ref 11.5–15.5)
WBC: 4.2 10*3/uL (ref 4.0–10.5)
nRBC: 0 % (ref 0.0–0.2)

## 2021-07-05 LAB — GLUCOSE, CAPILLARY: Glucose-Capillary: 257 mg/dL — ABNORMAL HIGH (ref 70–99)

## 2021-07-05 NOTE — Progress Notes (Signed)
COVID 07/15/21   PCP - Dr. Pollyann Samples Oncologist-Dr. Mamie Nick. Iruku Cardiologist -none   Chest x-ray - no EKG - 07/05/21-chart Stress Test - no ECHO - 07/06/20-epic Cardiac Cath - no Pacemaker/ICD device last checked:NA  Sleep Study - yes CPAP - yes  Fasting Blood Sugar - pt tests once a month maybe and he can't remember his numbers Checks Blood Sugar _____ times a day  Blood Thinner Instructions:Xarelto/ Dr. Chryl Heck Aspirin Instructions:Stop 2 days prior to DOS/ Dr. Chryl Heck Last Dose:07/14/21  Anesthesia review: yes  Patient denies shortness of breath, fever, cough and chest pain at PAT appointment Pt has no SOB with activities. He has had chemotherapy and radiation.  Patient verbalized understanding of instructions that were given to them at the PAT appointment. Patient was also instructed that they will need to review over the PAT instructions again at home before surgery. Yes. Wife at PAT to hear instructions as well.

## 2021-07-10 NOTE — Progress Notes (Signed)
Anesthesia Chart Review   Case: [id number] Date/Time: 07/17/21 1245   Procedures:      XI ROBOTIC ASSISTED LOWER ANTERIOR RESECTION     DIVERTING ILEOSTOMY   Anesthesia type: General   Pre-op diagnosis: RECTAL CANCER   Location: [facility] ROOM 02 / [facility]   Surgeons: [name], MD       OACZYSAYTK:50 y.o. never smoker with h/o HTN, DM II (diet controlled), PVD, OSA with CPAP, PE 06/2020 (on Xarelto), rectal cancer scheduled for above procedure 10/07/3233 with Dr. [name].   Pt has been cleared by hematology to hold Xarelto 2 days prior to surgery.   CBG at PAT visit 257, this is non-fasting.  A1C 8.0.  Pt is currently on no diabetes medications. Forwarded to Dr. Marcello Moores.   VS: BP (!) 143/87   Pulse (!) 9   Temp 36.9 C (Oral)   Resp 20   Ht 5' 10"  (1.778 m)   Wt 96.6 kg   SpO2 100%   BMI 30.56 kg/m   PROVIDERS: Shirline Frees, MD is PCP    LABS: Labs reviewed: Acceptable for surgery. (all labs ordered are listed, but only abnormal results are displayed)  Labs Reviewed  BASIC METABOLIC PANEL - Abnormal; Notable for the following components:      Result Value   Glucose, Bld 268 (*)    All other components within normal limits  HEMOGLOBIN A1C - Abnormal; Notable for the following components:   Hgb A1c MFr Bld 8.0 (*)    All other components within normal limits  GLUCOSE, CAPILLARY - Abnormal; Notable for the following components:   Glucose-Capillary 257 (*)    All other components within normal limits  CBC     IMAGES:   EKG: 07/05/2021 Rate 93 bpm  Normal sinus rhythm Low voltage QRS Inferior Q waves noted are no longer Cannot rule out Anterior infarct , age undetermined Abnormal ECG  CV: Echo 07/06/2020  1. Left ventricular ejection fraction, by estimation, is 55 to 60%. The  left ventricle has normal function. The left ventricle has no regional  wall motion abnormalities. Left ventricular diastolic parameters are  consistent with Grade I diastolic   dysfunction (impaired relaxation).   2. Right ventricular systolic function is mildly reduced. The right  ventricular size is mildly enlarged. There is mildly elevated pulmonary  artery systolic pressure.   3. The mitral valve is normal in structure. No evidence of mitral valve  regurgitation. No evidence of mitral stenosis.   4. The aortic valve is grossly normal. Aortic valve regurgitation is not  visualized. No aortic stenosis is present.   5. Aortic dilatation noted. There is borderline dilatation of the  ascending aorta, measuring 38 mm.   6. The inferior vena cava is dilated in size with <50% respiratory  variability, suggesting right atrial pressure of 15 mmHg. Past Medical History:  Diagnosis Date   Anemia 02/2021   iron   Anxiety    Asthma    Cancer ([place]) 02/2021   Depression    Diabetes mellitus without complication ([place])    History of kidney stones    HLD (hyperlipidemia)    Hypertension    Kidney stones    Neuromuscular disorder (HCC)    from radiation bi lat   OSA (obstructive sleep apnea)    CPAP   Peripheral vascular disease ([place])    DVT    Past Surgical History:  Procedure Laterality Date   APPENDECTOMY  1989   ag   COLONOSCOPY  03/05/2021   CYST EXCISION  2011   left neck   WISDOM TOOTH EXTRACTION      MEDICATIONS:  acetaminophen (TYLENOL) 500 MG tablet   albuterol (VENTOLIN HFA) 108 (90 Base) MCG/ACT inhaler   ALPRAZolam (XANAX) 0.25 MG tablet   blood glucose meter kit and supplies KIT   capecitabine (XELODA) 500 MG tablet   Cinnamon 500 MG capsule   fenofibrate 160 MG tablet   Ferrous Sulfate (IRON) 325 (65 Fe) MG TABS   irbesartan (AVAPRO) 150 MG tablet   meloxicam (MOBIC) 15 MG tablet   metroNIDAZOLE (FLAGYL) 500 MG tablet   neomycin (MYCIFRADIN) 500 MG tablet   ondansetron (ZOFRAN) 8 MG tablet   polycarbophil (FIBERCON) 625 MG tablet   polyethylene glycol powder (GLYCOLAX/MIRALAX) 17 GM/SCOOP powder   predniSONE (DELTASONE) 50 MG  tablet   prochlorperazine (COMPAZINE) 10 MG tablet   tetrahydrozoline-zinc (VISINE-AC) 0.05-0.25 % ophthalmic solution   XARELTO 20 MG TABS tablet   XIGDUO XR 01-999 MG TB24   No current facility-administered medications for this encounter.    Konrad Felix Ward, PA-C [facility]-Surgical Testing [phone]

## 2021-07-10 NOTE — Anesthesia Preprocedure Evaluation (Addendum)
Anesthesia Evaluation  Patient identified by MRN, date of birth, ID band Patient awake    Reviewed: NPO status , Patient's Chart, lab work & pertinent test results  Airway Mallampati: II  TM Distance: >3 FB Neck ROM: Full    Dental  (+) Teeth Intact   Pulmonary asthma , sleep apnea and Continuous Positive Airway Pressure Ventilation , COPD,  COPD inhaler, PE (10/21)   Pulmonary exam normal        Cardiovascular hypertension, Pt. on medications + DVT (on Xarelto)   Rhythm:Regular Rate:Normal     Neuro/Psych Anxiety Depression negative neurological ROS     GI/Hepatic Neg liver ROS, GERD  ,Rectal cancer   Endo/Other  diabetes, Well Controlled  Renal/GU Renal diseaseHistory of calculi  negative genitourinary   Musculoskeletal negative musculoskeletal ROS (+)   Abdominal (+)  Abdomen: soft.    Peds  Hematology  (+) anemia ,   Anesthesia Other Findings   Reproductive/Obstetrics                           Anesthesia Physical Anesthesia Plan  ASA: 3  Anesthesia Plan: General   Post-op Pain Management:    Induction: Intravenous  PONV Risk Score and Plan: 2 and Ondansetron, Dexamethasone, Midazolam and Treatment may vary due to age or medical condition  Airway Management Planned: Mask and Oral ETT  Additional Equipment: None  Intra-op Plan:   Post-operative Plan: Extubation in OR  Informed Consent:   Plan Discussed with:   Anesthesia Plan Comments: (See PAT note 07/05/2021, Konrad Felix, PA-C  Lab Results      Component                Value               Date                      WBC                      4.2                 07/05/2021                HGB                      15.3                07/05/2021                HCT                      46.2                07/05/2021                MCV                      84.6                07/05/2021                PLT                       175                 07/05/2021  Lab Results      Component                Value               Date                      NA                       135                 07/05/2021                K                        4.7                 07/05/2021                CO2                      24                  07/05/2021                GLUCOSE                  268 (H)             07/05/2021                BUN                      18                  07/05/2021                CREATININE               0.97                07/05/2021                CALCIUM                  9.5                 07/05/2021                GFRNONAA                 >60                 07/05/2021           Echo 07/06/2020 1. Left ventricular ejection fraction, by estimation, is 55 to 60%. The  left ventricle has normal function. The left ventricle has no regional  wall motion abnormalities. Left ventricular diastolic parameters are  consistent with Grade I diastolic  dysfunction (impaired relaxation).  2. Right ventricular systolic function is mildly reduced. The right  ventricular size is mildly enlarged. There is mildly elevated pulmonary  artery systolic pressure.  3. The mitral valve is normal in structure. No evidence of mitral valve  regurgitation. No evidence of mitral stenosis.  4. The aortic valve is grossly normal. Aortic valve regurgitation is not  visualized. No aortic stenosis is present.  5. Aortic dilatation noted. There is borderline dilatation of the  ascending aorta, measuring 38 mm.  6. The inferior vena cava is dilated in size with <50% respiratory  variability, suggesting right atrial pressure of 15 mmHg. Past Medical History:  )       Anesthesia Quick Evaluation

## 2021-07-15 ENCOUNTER — Other Ambulatory Visit: Payer: Self-pay | Admitting: General Surgery

## 2021-07-16 LAB — SARS CORONAVIRUS 2 (TAT 6-24 HRS): SARS Coronavirus 2: NEGATIVE

## 2021-07-17 ENCOUNTER — Inpatient Hospital Stay (HOSPITAL_COMMUNITY): Payer: BC Managed Care – PPO | Admitting: Physician Assistant

## 2021-07-17 ENCOUNTER — Inpatient Hospital Stay (HOSPITAL_COMMUNITY): Payer: BC Managed Care – PPO | Admitting: Certified Registered Nurse Anesthetist

## 2021-07-17 ENCOUNTER — Other Ambulatory Visit: Payer: Self-pay

## 2021-07-17 ENCOUNTER — Encounter (HOSPITAL_COMMUNITY): Payer: Self-pay | Admitting: General Surgery

## 2021-07-17 ENCOUNTER — Inpatient Hospital Stay (HOSPITAL_COMMUNITY)
Admission: RE | Admit: 2021-07-17 | Discharge: 2021-07-21 | DRG: 331 | Disposition: A | Discharge status: Home Health | Payer: BC Managed Care – PPO | Attending: General Surgery | Admitting: General Surgery

## 2021-07-17 ENCOUNTER — Encounter (HOSPITAL_COMMUNITY)
Admission: RE | Disposition: A | Discharge status: Home Health | Payer: Self-pay | Source: Home / Self Care | Attending: General Surgery

## 2021-07-17 DIAGNOSIS — Z9221 Personal history of antineoplastic chemotherapy: Secondary | ICD-10-CM

## 2021-07-17 DIAGNOSIS — Z79899 Other long term (current) drug therapy: Secondary | ICD-10-CM | POA: Diagnosis not present

## 2021-07-17 DIAGNOSIS — Z91041 Radiographic dye allergy status: Secondary | ICD-10-CM

## 2021-07-17 DIAGNOSIS — J45909 Unspecified asthma, uncomplicated: Secondary | ICD-10-CM | POA: Diagnosis not present

## 2021-07-17 DIAGNOSIS — F419 Anxiety disorder, unspecified: Secondary | ICD-10-CM | POA: Diagnosis present

## 2021-07-17 DIAGNOSIS — F418 Other specified anxiety disorders: Secondary | ICD-10-CM | POA: Diagnosis not present

## 2021-07-17 DIAGNOSIS — E1151 Type 2 diabetes mellitus with diabetic peripheral angiopathy without gangrene: Secondary | ICD-10-CM | POA: Diagnosis present

## 2021-07-17 DIAGNOSIS — Z833 Family history of diabetes mellitus: Secondary | ICD-10-CM

## 2021-07-17 DIAGNOSIS — Z803 Family history of malignant neoplasm of breast: Secondary | ICD-10-CM | POA: Diagnosis not present

## 2021-07-17 DIAGNOSIS — Z86718 Personal history of other venous thrombosis and embolism: Secondary | ICD-10-CM | POA: Diagnosis not present

## 2021-07-17 DIAGNOSIS — E1165 Type 2 diabetes mellitus with hyperglycemia: Secondary | ICD-10-CM | POA: Diagnosis not present

## 2021-07-17 DIAGNOSIS — C20 Malignant neoplasm of rectum: Secondary | ICD-10-CM | POA: Diagnosis not present

## 2021-07-17 DIAGNOSIS — Z86711 Personal history of pulmonary embolism: Secondary | ICD-10-CM | POA: Diagnosis not present

## 2021-07-17 DIAGNOSIS — Z9049 Acquired absence of other specified parts of digestive tract: Secondary | ICD-10-CM | POA: Diagnosis not present

## 2021-07-17 DIAGNOSIS — K219 Gastro-esophageal reflux disease without esophagitis: Secondary | ICD-10-CM | POA: Diagnosis not present

## 2021-07-17 DIAGNOSIS — Z8249 Family history of ischemic heart disease and other diseases of the circulatory system: Secondary | ICD-10-CM

## 2021-07-17 DIAGNOSIS — E785 Hyperlipidemia, unspecified: Secondary | ICD-10-CM | POA: Diagnosis present

## 2021-07-17 DIAGNOSIS — I10 Essential (primary) hypertension: Secondary | ICD-10-CM | POA: Diagnosis not present

## 2021-07-17 DIAGNOSIS — Z7901 Long term (current) use of anticoagulants: Secondary | ICD-10-CM | POA: Diagnosis not present

## 2021-07-17 HISTORY — PX: DIVERTING ILEOSTOMY: SHX5799

## 2021-07-17 HISTORY — PX: XI ROBOTIC ASSISTED LOWER ANTERIOR RESECTION: SHX6558

## 2021-07-17 LAB — GLUCOSE, CAPILLARY
Glucose-Capillary: 145 mg/dL — ABNORMAL HIGH (ref 70–99)
Glucose-Capillary: 173 mg/dL — ABNORMAL HIGH (ref 70–99)
Glucose-Capillary: 174 mg/dL — ABNORMAL HIGH (ref 70–99)

## 2021-07-17 LAB — TYPE AND SCREEN
ABO/RH(D): A POS
Antibody Screen: NEGATIVE

## 2021-07-17 LAB — ABO/RH: ABO/RH(D): A POS

## 2021-07-17 SURGERY — RESECTION, RECTUM, LOW ANTERIOR, ROBOT-ASSISTED
Anesthesia: General

## 2021-07-17 MED ORDER — ACETAMINOPHEN 500 MG PO TABS
1000.0000 mg | ORAL_TABLET | Freq: Four times a day (QID) | ORAL | Status: DC
Start: 2021-07-17 — End: 2021-07-21
  Administered 2021-07-17 – 2021-07-21 (×14): 1000 mg via ORAL
  Filled 2021-07-17 (×15): qty 2

## 2021-07-17 MED ORDER — ONDANSETRON HCL 4 MG/2ML IJ SOLN
INTRAMUSCULAR | Status: AC
  Filled 2021-07-17: qty 2

## 2021-07-17 MED ORDER — GABAPENTIN 300 MG PO CAPS
300.0000 mg | ORAL_CAPSULE | Freq: Two times a day (BID) | ORAL | Status: DC
  Administered 2021-07-17 – 2021-07-21 (×8): 300 mg via ORAL
  Filled 2021-07-17 (×8): qty 1

## 2021-07-17 MED ORDER — DEXAMETHASONE SODIUM PHOSPHATE 10 MG/ML IJ SOLN
INTRAMUSCULAR | Status: DC | PRN
  Administered 2021-07-17: 4 mg via INTRAVENOUS

## 2021-07-17 MED ORDER — ENSURE SURGERY PO LIQD
237.0000 mL | Freq: Two times a day (BID) | ORAL | Status: DC
  Administered 2021-07-18 – 2021-07-21 (×4): 237 mL via ORAL

## 2021-07-17 MED ORDER — ALVIMOPAN 12 MG PO CAPS
12.0000 mg | ORAL_CAPSULE | Freq: Two times a day (BID) | ORAL | Status: DC
  Administered 2021-07-18: 12 mg via ORAL
  Filled 2021-07-17: qty 1

## 2021-07-17 MED ORDER — IRBESARTAN 150 MG PO TABS
150.0000 mg | ORAL_TABLET | Freq: Every day | ORAL | Status: DC
  Administered 2021-07-19 – 2021-07-20 (×2): 150 mg via ORAL
  Filled 2021-07-17 (×4): qty 1

## 2021-07-17 MED ORDER — DAPAGLIFLOZIN PRO-METFORMIN ER 5-1000 MG PO TB24: 1.0000 | ORAL_TABLET | Freq: Two times a day (BID) | ORAL | Status: DC

## 2021-07-17 MED ORDER — ACETAMINOPHEN 10 MG/ML IV SOLN: 1000.0000 mg | Freq: Once | INTRAVENOUS | Status: DC | PRN

## 2021-07-17 MED ORDER — PROMETHAZINE HCL 25 MG/ML IJ SOLN: 6.2500 mg | INTRAMUSCULAR | Status: DC | PRN

## 2021-07-17 MED ORDER — ORAL CARE MOUTH RINSE: 15.0000 mL | Freq: Once | OROMUCOSAL | Status: AC

## 2021-07-17 MED ORDER — LACTATED RINGERS IR SOLN
Status: DC | PRN
  Administered 2021-07-17: 1000 mL

## 2021-07-17 MED ORDER — METFORMIN HCL ER 500 MG PO TB24
1000.0000 mg | ORAL_TABLET | Freq: Two times a day (BID) | ORAL | Status: DC
  Administered 2021-07-18 – 2021-07-21 (×7): 1000 mg via ORAL
  Filled 2021-07-17 (×6): qty 2

## 2021-07-17 MED ORDER — PHENYLEPHRINE HCL (PRESSORS) 10 MG/ML IV SOLN
INTRAVENOUS | Status: AC
  Filled 2021-07-17: qty 2

## 2021-07-17 MED ORDER — PROPOFOL 10 MG/ML IV BOLUS
INTRAVENOUS | Status: DC | PRN
  Administered 2021-07-17: 170 mg via INTRAVENOUS

## 2021-07-17 MED ORDER — PROPOFOL 10 MG/ML IV BOLUS
INTRAVENOUS | Status: AC
  Filled 2021-07-17: qty 20

## 2021-07-17 MED ORDER — LIDOCAINE 2% (20 MG/ML) 5 ML SYRINGE
INTRAMUSCULAR | Status: DC | PRN
  Administered 2021-07-17: 60 mg via INTRAVENOUS

## 2021-07-17 MED ORDER — SACCHAROMYCES BOULARDII 250 MG PO CAPS
250.0000 mg | ORAL_CAPSULE | Freq: Two times a day (BID) | ORAL | Status: DC
  Administered 2021-07-17 – 2021-07-21 (×8): 250 mg via ORAL
  Filled 2021-07-17 (×8): qty 1

## 2021-07-17 MED ORDER — FENTANYL CITRATE (PF) 100 MCG/2ML IJ SOLN
INTRAMUSCULAR | Status: AC
  Filled 2021-07-17: qty 2

## 2021-07-17 MED ORDER — SPY AGENT GREEN - (INDOCYANINE FOR INJECTION)
INTRAMUSCULAR | Status: DC | PRN
  Administered 2021-07-17: 3 mL via INTRAVENOUS

## 2021-07-17 MED ORDER — SIMETHICONE 80 MG PO CHEW
40.0000 mg | CHEWABLE_TABLET | Freq: Four times a day (QID) | ORAL | Status: DC | PRN
  Administered 2021-07-17 – 2021-07-19 (×2): 40 mg via ORAL
  Filled 2021-07-17 (×2): qty 1

## 2021-07-17 MED ORDER — MIDAZOLAM HCL 5 MG/5ML IJ SOLN
INTRAMUSCULAR | Status: DC | PRN
  Administered 2021-07-17: 2 mg via INTRAVENOUS

## 2021-07-17 MED ORDER — BUPIVACAINE-EPINEPHRINE 0.25% -1:200000 IJ SOLN
INTRAMUSCULAR | Status: DC | PRN
  Administered 2021-07-17: 30 mL

## 2021-07-17 MED ORDER — CHLORHEXIDINE GLUCONATE 0.12 % MT SOLN
15.0000 mL | Freq: Once | OROMUCOSAL | Status: AC
  Administered 2021-07-17: 15 mL via OROMUCOSAL

## 2021-07-17 MED ORDER — FENTANYL CITRATE (PF) 100 MCG/2ML IJ SOLN
INTRAMUSCULAR | Status: DC | PRN
  Administered 2021-07-17 (×6): 50 ug via INTRAVENOUS
  Administered 2021-07-17: 100 ug via INTRAVENOUS

## 2021-07-17 MED ORDER — 0.9 % SODIUM CHLORIDE (POUR BTL) OPTIME
TOPICAL | Status: DC | PRN
  Administered 2021-07-17: 2000 mL

## 2021-07-17 MED ORDER — SUGAMMADEX SODIUM 200 MG/2ML IV SOLN
INTRAVENOUS | Status: DC | PRN
  Administered 2021-07-17: 200 mg via INTRAVENOUS

## 2021-07-17 MED ORDER — SODIUM CHLORIDE 0.9 % IV SOLN
2.0000 g | Freq: Two times a day (BID) | INTRAVENOUS | Status: AC
  Administered 2021-07-17: 2 g via INTRAVENOUS
  Filled 2021-07-17: qty 2

## 2021-07-17 MED ORDER — KETAMINE HCL 10 MG/ML IJ SOLN
INTRAMUSCULAR | Status: DC | PRN
  Administered 2021-07-17: 40 mg via INTRAVENOUS
  Administered 2021-07-17: 10 mg via INTRAVENOUS

## 2021-07-17 MED ORDER — BUPIVACAINE LIPOSOME 1.3 % IJ SUSP
INTRAMUSCULAR | Status: DC | PRN
  Administered 2021-07-17: 20 mL

## 2021-07-17 MED ORDER — TRAMADOL HCL 50 MG PO TABS
50.0000 mg | ORAL_TABLET | Freq: Four times a day (QID) | ORAL | Status: DC | PRN
  Administered 2021-07-17 – 2021-07-18 (×3): 100 mg via ORAL
  Administered 2021-07-19: 50 mg via ORAL
  Filled 2021-07-17 (×5): qty 2

## 2021-07-17 MED ORDER — ROCURONIUM BROMIDE 10 MG/ML (PF) SYRINGE
PREFILLED_SYRINGE | INTRAVENOUS | Status: AC
  Filled 2021-07-17: qty 10

## 2021-07-17 MED ORDER — ALUM & MAG HYDROXIDE-SIMETH 200-200-20 MG/5ML PO SUSP
30.0000 mL | Freq: Four times a day (QID) | ORAL | Status: DC | PRN
  Administered 2021-07-18: 30 mL via ORAL
  Filled 2021-07-17: qty 30

## 2021-07-17 MED ORDER — LACTATED RINGERS IV SOLN: INTRAVENOUS | Status: DC

## 2021-07-17 MED ORDER — ONDANSETRON HCL 4 MG/2ML IJ SOLN: 4.0000 mg | Freq: Four times a day (QID) | INTRAMUSCULAR | Status: DC | PRN

## 2021-07-17 MED ORDER — LIDOCAINE HCL (PF) 2 % IJ SOLN
INTRAMUSCULAR | Status: AC
  Filled 2021-07-17: qty 5

## 2021-07-17 MED ORDER — DEXAMETHASONE SODIUM PHOSPHATE 10 MG/ML IJ SOLN
INTRAMUSCULAR | Status: AC
  Filled 2021-07-17: qty 1

## 2021-07-17 MED ORDER — INSULIN ASPART 100 UNIT/ML IJ SOLN
0.0000 [IU] | Freq: Three times a day (TID) | INTRAMUSCULAR | Status: DC
  Administered 2021-07-19: 4 [IU] via SUBCUTANEOUS
  Administered 2021-07-19: 3 [IU] via SUBCUTANEOUS
  Administered 2021-07-20: 7 [IU] via SUBCUTANEOUS
  Administered 2021-07-20: 3 [IU] via SUBCUTANEOUS
  Administered 2021-07-20 – 2021-07-21 (×2): 4 [IU] via SUBCUTANEOUS

## 2021-07-17 MED ORDER — BUPIVACAINE LIPOSOME 1.3 % IJ SUSP: 20.0000 mL | Freq: Once | INTRAMUSCULAR | Status: DC

## 2021-07-17 MED ORDER — ONDANSETRON HCL 4 MG/2ML IJ SOLN
INTRAMUSCULAR | Status: DC | PRN
Start: 2021-07-17 — End: 2021-07-17
  Administered 2021-07-17: 4 mg via INTRAVENOUS

## 2021-07-17 MED ORDER — DAPAGLIFLOZIN PROPANEDIOL 5 MG PO TABS
5.0000 mg | ORAL_TABLET | Freq: Two times a day (BID) | ORAL | Status: DC
  Administered 2021-07-18 – 2021-07-21 (×6): 5 mg via ORAL
  Filled 2021-07-17 (×7): qty 1

## 2021-07-17 MED ORDER — GABAPENTIN 300 MG PO CAPS
300.0000 mg | ORAL_CAPSULE | ORAL | Status: AC
  Administered 2021-07-17: 300 mg via ORAL
  Filled 2021-07-17: qty 1

## 2021-07-17 MED ORDER — HYDROMORPHONE HCL 1 MG/ML IJ SOLN: 0.2500 mg | INTRAMUSCULAR | Status: DC | PRN

## 2021-07-17 MED ORDER — ALBUTEROL SULFATE (2.5 MG/3ML) 0.083% IN NEBU: 2.5000 mg | INHALATION_SOLUTION | RESPIRATORY_TRACT | Status: DC | PRN

## 2021-07-17 MED ORDER — MIDAZOLAM HCL 2 MG/2ML IJ SOLN
INTRAMUSCULAR | Status: AC
  Filled 2021-07-17: qty 2

## 2021-07-17 MED ORDER — SODIUM CHLORIDE 0.9 % IV SOLN
2.0000 g | INTRAVENOUS | Status: AC
  Administered 2021-07-17: 2 g via INTRAVENOUS
  Filled 2021-07-17: qty 2

## 2021-07-17 MED ORDER — KCL IN DEXTROSE-NACL 20-5-0.45 MEQ/L-%-% IV SOLN
INTRAVENOUS | Status: DC
  Filled 2021-07-17: qty 1000

## 2021-07-17 MED ORDER — LACTATED RINGERS IV SOLN
INTRAVENOUS | Status: DC | PRN
Start: 2021-07-17 — End: 2021-07-17

## 2021-07-17 MED ORDER — MEPERIDINE HCL 50 MG/ML IJ SOLN: 6.2500 mg | INTRAMUSCULAR | Status: DC | PRN

## 2021-07-17 MED ORDER — ALPRAZOLAM 0.25 MG PO TABS
0.2500 mg | ORAL_TABLET | Freq: Every day | ORAL | Status: DC | PRN
  Administered 2021-07-20: 0.25 mg via ORAL
  Filled 2021-07-17: qty 1

## 2021-07-17 MED ORDER — ROCURONIUM BROMIDE 10 MG/ML (PF) SYRINGE
PREFILLED_SYRINGE | INTRAVENOUS | Status: DC | PRN
  Administered 2021-07-17: 60 mg via INTRAVENOUS
  Administered 2021-07-17: 20 mg via INTRAVENOUS
  Administered 2021-07-17: 30 mg via INTRAVENOUS
  Administered 2021-07-17: 20 mg via INTRAVENOUS

## 2021-07-17 MED ORDER — DIPHENHYDRAMINE HCL 50 MG/ML IJ SOLN: 25.0000 mg | Freq: Four times a day (QID) | INTRAMUSCULAR | Status: DC | PRN

## 2021-07-17 MED ORDER — ACETAMINOPHEN 500 MG PO TABS
1000.0000 mg | ORAL_TABLET | ORAL | Status: AC
  Administered 2021-07-17: 1000 mg via ORAL
  Filled 2021-07-17: qty 2

## 2021-07-17 MED ORDER — ALBUTEROL SULFATE HFA 108 (90 BASE) MCG/ACT IN AERS: 2.0000 | INHALATION_SPRAY | RESPIRATORY_TRACT | Status: DC | PRN

## 2021-07-17 MED ORDER — DIPHENHYDRAMINE HCL 25 MG PO CAPS: 25.0000 mg | ORAL_CAPSULE | Freq: Four times a day (QID) | ORAL | Status: DC | PRN

## 2021-07-17 MED ORDER — ENSURE PRE-SURGERY PO LIQD
592.0000 mL | Freq: Once | ORAL | Status: DC
  Filled 2021-07-17: qty 592

## 2021-07-17 MED ORDER — HEPARIN SODIUM (PORCINE) 5000 UNIT/ML IJ SOLN
5000.0000 [IU] | Freq: Once | INTRAMUSCULAR | Status: AC
  Administered 2021-07-17: 5000 [IU] via SUBCUTANEOUS
  Filled 2021-07-17: qty 1

## 2021-07-17 MED ORDER — HYDROMORPHONE HCL 1 MG/ML IJ SOLN
0.5000 mg | INTRAMUSCULAR | Status: DC | PRN
  Administered 2021-07-19: 0.5 mg via INTRAVENOUS
  Filled 2021-07-17 (×2): qty 0.5

## 2021-07-17 MED ORDER — ENSURE PRE-SURGERY PO LIQD
296.0000 mL | Freq: Once | ORAL | Status: DC
  Filled 2021-07-17: qty 296

## 2021-07-17 MED ORDER — ALVIMOPAN 12 MG PO CAPS
12.0000 mg | ORAL_CAPSULE | ORAL | Status: AC
  Administered 2021-07-17: 12 mg via ORAL
  Filled 2021-07-17: qty 1

## 2021-07-17 MED ORDER — LIDOCAINE HCL 2 % IJ SOLN
INTRAMUSCULAR | Status: AC
  Filled 2021-07-17: qty 20

## 2021-07-17 MED ORDER — KETAMINE HCL 10 MG/ML IJ SOLN
INTRAMUSCULAR | Status: AC
  Filled 2021-07-17: qty 1

## 2021-07-17 MED ORDER — ENOXAPARIN SODIUM 40 MG/0.4ML IJ SOSY
40.0000 mg | PREFILLED_SYRINGE | INTRAMUSCULAR | Status: DC
  Administered 2021-07-18: 40 mg via SUBCUTANEOUS
  Filled 2021-07-17: qty 0.4

## 2021-07-17 MED ORDER — INSULIN ASPART 100 UNIT/ML IJ SOLN: 0.0000 [IU] | Freq: Every day | INTRAMUSCULAR | Status: DC

## 2021-07-17 MED ORDER — FENTANYL CITRATE (PF) 250 MCG/5ML IJ SOLN
INTRAMUSCULAR | Status: AC
  Filled 2021-07-17: qty 5

## 2021-07-17 MED ORDER — LIDOCAINE 20MG/ML (2%) 15 ML SYRINGE OPTIME
INTRAMUSCULAR | Status: DC | PRN
Start: 2021-07-17 — End: 2021-07-17
  Administered 2021-07-17: 1.5 mg/kg/h via INTRAVENOUS

## 2021-07-17 MED ORDER — PHENYLEPHRINE HCL-NACL 20-0.9 MG/250ML-% IV SOLN
INTRAVENOUS | Status: DC | PRN
  Administered 2021-07-17: 20 ug/min via INTRAVENOUS

## 2021-07-17 MED ORDER — ONDANSETRON HCL 4 MG PO TABS: 4.0000 mg | ORAL_TABLET | Freq: Four times a day (QID) | ORAL | Status: DC | PRN

## 2021-07-17 SURGICAL SUPPLY — 98 items
BAG COUNTER SPONGE SURGICOUNT (BAG) ×2 IMPLANT
BARRIER SKIN 2 1/4 (WOUND CARE) ×2 IMPLANT
BLADE EXTENDED COATED 6.5IN (ELECTRODE) IMPLANT
CANNULA REDUC XI 12-8 STAPL (CANNULA) ×2
CANNULA REDUCER 12-8 DVNC XI (CANNULA) ×1 IMPLANT
CATH ROBINSON RED A/P 16FR (CATHETERS) ×2 IMPLANT
CELLS DAT CNTRL 66122 CELL SVR (MISCELLANEOUS) IMPLANT
COVER SURGICAL LIGHT HANDLE (MISCELLANEOUS) ×4 IMPLANT
COVER TIP SHEARS 8 DVNC (MISCELLANEOUS) ×1 IMPLANT
COVER TIP SHEARS 8MM DA VINCI (MISCELLANEOUS) ×2
DECANTER SPIKE VIAL GLASS SM (MISCELLANEOUS) IMPLANT
DERMABOND ADVANCED (GAUZE/BANDAGES/DRESSINGS) ×1
DERMABOND ADVANCED .7 DNX12 (GAUZE/BANDAGES/DRESSINGS) ×1 IMPLANT
DRAIN CHANNEL 19F RND (DRAIN) ×2 IMPLANT
DRAPE ARM DVNC X/XI (DISPOSABLE) ×4 IMPLANT
DRAPE COLUMN DVNC XI (DISPOSABLE) ×1 IMPLANT
DRAPE DA VINCI XI ARM (DISPOSABLE) ×8
DRAPE DA VINCI XI COLUMN (DISPOSABLE) ×2
DRAPE SURG IRRIG POUCH 19X23 (DRAPES) ×2 IMPLANT
DRSG OPSITE POSTOP 4X10 (GAUZE/BANDAGES/DRESSINGS) ×2 IMPLANT
DRSG OPSITE POSTOP 4X6 (GAUZE/BANDAGES/DRESSINGS) IMPLANT
DRSG OPSITE POSTOP 4X8 (GAUZE/BANDAGES/DRESSINGS) IMPLANT
DRSG TEGADERM 4X4.75 (GAUZE/BANDAGES/DRESSINGS) ×2 IMPLANT
ELECT PENCIL ROCKER SW 15FT (MISCELLANEOUS) ×2 IMPLANT
ELECT REM PT RETURN 15FT ADLT (MISCELLANEOUS) ×2 IMPLANT
ENDOLOOP SUT PDS II  0 18 (SUTURE)
ENDOLOOP SUT PDS II 0 18 (SUTURE) IMPLANT
EVACUATOR SILICONE 100CC (DRAIN) IMPLANT
GAUZE SPONGE 2X2 8PLY STRL LF (GAUZE/BANDAGES/DRESSINGS) ×1 IMPLANT
GLOVE SURG ENC MOIS LTX SZ6.5 (GLOVE) ×6 IMPLANT
GLOVE SURG UNDER POLY LF SZ7 (GLOVE) ×4 IMPLANT
GOWN STRL REUS W/TWL XL LVL3 (GOWN DISPOSABLE) ×16 IMPLANT
GRASPER SUT TROCAR 14GX15 (MISCELLANEOUS) IMPLANT
HOLDER FOLEY CATH W/STRAP (MISCELLANEOUS) ×2 IMPLANT
IRRIG SUCT STRYKERFLOW 2 WTIP (MISCELLANEOUS) ×2
IRRIGATION SUCT STRKRFLW 2 WTP (MISCELLANEOUS) ×1 IMPLANT
KIT PROCEDURE DA VINCI SI (MISCELLANEOUS) ×2
KIT PROCEDURE DVNC SI (MISCELLANEOUS) ×1 IMPLANT
NEEDLE INSUFFLATION 14GA 120MM (NEEDLE) ×2 IMPLANT
PACK CARDIOVASCULAR III (CUSTOM PROCEDURE TRAY) ×2 IMPLANT
PACK COLON (CUSTOM PROCEDURE TRAY) ×2 IMPLANT
PAD POSITIONING PINK XL (MISCELLANEOUS) ×2 IMPLANT
POUCH OSTOMY 2 PC DRNBL 2.25 (WOUND CARE) ×1 IMPLANT
POUCH OSTOMY DRNBL 2 1/4 (WOUND CARE) ×2
RELOAD STAPLER 3.5X60 BLU DVNC (STAPLE) IMPLANT
RELOAD STAPLER 4.3X45 GRN DVNC (STAPLE) ×6 IMPLANT
RELOAD STAPLER 4.3X60 GRN DVNC (STAPLE) IMPLANT
RTRCTR WOUND ALEXIS 18CM MED (MISCELLANEOUS)
RTRCTR WOUND ALEXIS 18CM SML (INSTRUMENTS) ×2
SAVER CELL AAL HAEMONETICS (INSTRUMENTS) ×1 IMPLANT
SCISSORS LAP 5X35 DISP (ENDOMECHANICALS) IMPLANT
SEAL CANN UNIV 5-8 DVNC XI (MISCELLANEOUS) ×4 IMPLANT
SEAL XI 5MM-8MM UNIVERSAL (MISCELLANEOUS) ×8
SEALER VESSEL DA VINCI XI (MISCELLANEOUS) ×2
SEALER VESSEL EXT DVNC XI (MISCELLANEOUS) ×1 IMPLANT
SOLUTION ELECTROLUBE (MISCELLANEOUS) ×2 IMPLANT
SPONGE GAUZE 2X2 STER 10/PKG (GAUZE/BANDAGES/DRESSINGS) ×1
STAPLER 60 DA VINCI SURE FORM (STAPLE)
STAPLER 60 SUREFORM DVNC (STAPLE) IMPLANT
STAPLER CANNULA SEAL DVNC XI (STAPLE) IMPLANT
STAPLER CANNULA SEAL XI (STAPLE)
STAPLER ECHELON POWER CIR 29 (STAPLE) ×2 IMPLANT
STAPLER ECHELON POWER CIR 31 (STAPLE) IMPLANT
STAPLER RELOAD 3.5X60 BLU DVNC (STAPLE)
STAPLER RELOAD 3.5X60 BLUE (STAPLE)
STAPLER RELOAD 4.3X45 GREEN (STAPLE) ×12
STAPLER RELOAD 4.3X45 GRN DVNC (STAPLE) ×6
STAPLER RELOAD 4.3X60 GREEN (STAPLE)
STAPLER RELOAD 4.3X60 GRN DVNC (STAPLE)
STOPCOCK 4 WAY LG BORE MALE ST (IV SETS) ×4 IMPLANT
SUT ETHILON 2 0 PS N (SUTURE) ×2 IMPLANT
SUT NOVA NAB DX-16 0-1 5-0 T12 (SUTURE) ×4 IMPLANT
SUT PROLENE 2 0 KS (SUTURE) ×2 IMPLANT
SUT SILK 2 0 (SUTURE) ×2
SUT SILK 2 0 SH CR/8 (SUTURE) ×2 IMPLANT
SUT SILK 2-0 18XBRD TIE 12 (SUTURE) ×1 IMPLANT
SUT SILK 3 0 (SUTURE)
SUT SILK 3 0 SH CR/8 (SUTURE) ×2 IMPLANT
SUT SILK 3-0 18XBRD TIE 12 (SUTURE) IMPLANT
SUT V-LOC BARB 180 2/0GR6 GS22 (SUTURE)
SUT VIC AB 2-0 SH 18 (SUTURE) ×4 IMPLANT
SUT VIC AB 2-0 SH 27 (SUTURE)
SUT VIC AB 2-0 SH 27X BRD (SUTURE) IMPLANT
SUT VIC AB 3-0 SH 18 (SUTURE) IMPLANT
SUT VIC AB 4-0 PS2 27 (SUTURE) ×4 IMPLANT
SUT VICRYL 0 UR6 27IN ABS (SUTURE) ×2 IMPLANT
SUTURE V-LC BRB 180 2/0GR6GS22 (SUTURE) IMPLANT
SYR 10ML ECCENTRIC (SYRINGE) ×2 IMPLANT
SYS LAPSCP GELPORT 120MM (MISCELLANEOUS)
SYS WOUND ALEXIS 18CM MED (MISCELLANEOUS)
SYSTEM LAPSCP GELPORT 120MM (MISCELLANEOUS) IMPLANT
SYSTEM WOUND ALEXIS 18CM MED (MISCELLANEOUS) IMPLANT
TOWEL OR 17X26 10 PK STRL BLUE (TOWEL DISPOSABLE) IMPLANT
TOWEL OR NON WOVEN STRL DISP B (DISPOSABLE) ×2 IMPLANT
TRAY FOLEY MTR SLVR 16FR STAT (SET/KITS/TRAYS/PACK) ×2 IMPLANT
TROCAR ADV FIXATION 5X100MM (TROCAR) ×2 IMPLANT
TUBING CONNECTING 10 (TUBING) ×4 IMPLANT
TUBING INSUFFLATION 10FT LAP (TUBING) ×2 IMPLANT

## 2021-07-17 NOTE — Anesthesia Procedure Notes (Signed)
Procedure Name: Intubation Date/Time: 07/17/2021 11:48 AM Performed by: Montel Clock, CRNA Pre-anesthesia Checklist: Patient identified, Emergency Drugs available, Suction available, Patient being monitored and Timeout performed Patient Re-evaluated:Patient Re-evaluated prior to induction Oxygen Delivery Method: Circle system utilized Preoxygenation: Pre-oxygenation with 100% oxygen Induction Type: IV induction Ventilation: Mask ventilation without difficulty and Oral airway inserted - appropriate to patient size Laryngoscope Size: Mac and 3 Grade View: Grade II Tube type: Oral Tube size: 7.5 mm Number of attempts: 1 Airway Equipment and Method: Stylet Placement Confirmation: ETT inserted through vocal cords under direct vision, positive ETCO2 and breath sounds checked- equal and bilateral Secured at: 23 cm Tube secured with: Tape Dental Injury: Teeth and Oropharynx as per pre-operative assessment

## 2021-07-17 NOTE — Consult Note (Signed)
W[facility] consult requested for presurgical stoma site marking.  This was already performed by Domenic Moras, [place] nurse, on 9/27: refer to notes copied and pasted below:  Examined patient lying, sitting, and standing in order to place the marking in the patient's visual field, away from any creases or abdominal contour issues and within the rectus muscle.  Below umbilicus, patient has pendulous abdomen and sagging skin from weight loss.  He cannot see stoma if placed in this lower area.  I will mark higher in a quadrant that patient can visualize and he feels comfortable with.     Marked for ileostomy in the RLQ  5 cm to the right of the umbilicus and  6 cm above the umbilicus.  [place] team will follow the patient post-op for teaching sessions. Julien Girt MSN, RN, [place], [place], [place]

## 2021-07-17 NOTE — Op Note (Signed)
07/17/2021  3:40 PM  PATIENT:  Steven [name]  50 y.o. male  Patient Care Team: Shirline Frees, MD as PCP - General (Family Medicine) [name], MD as Consulting Physician (Hematology and Oncology) Jonnie Finner, RN (Inactive) as Oncology Nurse Navigator  PRE-OPERATIVE DIAGNOSIS:  RECTAL CANCER  POST-OPERATIVE DIAGNOSIS:  RECTAL CANCER  PROCEDURE:  Procedure(s): XI ROBOTIC ASSISTED LOWER ANTERIOR RESECTION DIVERTING ILEOSTOMY INTRAOPERATIVE ASSESSMENT OF PERFUSION    Surgeon(s): [name], MD [name], MD [name], PA-C  ASSISTANT: Dr Johney Maine   ANESTHESIA:   local and general  EBL: 37ml Total I/O In: 2600 [I.V.:2500; IV Piggyback:100] Out: 500 [Urine:450; Blood:50]  Delay start of Pharmacological VTE agent (>24hrs) due to surgical blood loss or risk of bleeding:  no  DRAINS: (68F) Jackson-Pratt drain(s) with closed bulb suction in the pelvis    SPECIMEN:  Source of Specimen:  rectum, final distal margin  DISPOSITION OF SPECIMEN:  PATHOLOGY  COUNTS:  YES  PLAN OF CARE: Admit to inpatient   PATIENT DISPOSITION:  PACU - hemodynamically stable.  INDICATION:    50 y.o. F with large distal rectal cancer.  I recommended low anterior resection:  The anatomy & physiology of the digestive tract was discussed.  The pathophysiology was discussed.  Natural history risks without surgery was discussed.   I worked to give an overview of the disease and the frequent need to have multispecialty involvement.  I feel the risks of no intervention will lead to serious problems that outweigh the operative risks; therefore, I recommended a partial colectomy to remove the pathology.  Laparoscopic & open techniques were discussed.   Risks such as bleeding, infection, abscess, leak, reoperation, possible ostomy, hernia, heart attack, death, and other risks were discussed.  I noted a good likelihood this will help address the problem.   Goals of post-operative recovery  were discussed as well.    The patient expressed understanding & wished to proceed with surgery.  OR FINDINGS:   Patient had tumor noted in the left anterior distal rectum, no sphincter involvement  No obvious metastatic disease on visceral parietal peritoneum or liver.  The anastomosis rests 4 cm from the anal verge by rigid proctoscopy.  DESCRIPTION:   Informed consent was confirmed.  The patient underwent general anaesthesia without difficulty.  The patient was positioned appropriately.  VTE prevention in place.  The patient's abdomen was clipped, prepped, & draped in a sterile fashion.  Surgical timeout confirmed our plan.  The patient was positioned in reverse Trendelenburg.  Abdominal entry was gained using a Varies needle in the LUQ.  Entry was clean.  I induced carbon dioxide insufflation.  An 75mm robotic port was placed in the RUQ.  Camera inspection revealed no injury.  Extra ports were carefully placed under direct laparoscopic visualization.  I laparoscopically reflected the greater omentum and the upper abdomen the small bowel in the upper abdomen. The patient was appropriately positioned and the robot was docked to the patient's left side.  Instruments were placed under direct visualization.    I mobilized the sigmoid colon off of the pelvic sidewall.  I scored the base of peritoneum of the right side of the mesentery of the left colon from the ligament of Treitz to the peritoneal reflection of the mid rectum.  I elevated the sigmoid mesentery and enetered into the retro-mesenteric plane. We were able to identify the left ureter and gonadal vessels. We kept those posterior within the retroperitoneum and elevated the left colon mesentery off  that. I did isolated IMA pedicle but did not ligate it yet.  I continued distally and got into the avascular plane posterior to the mesorectum. This allowed me to help mobilize the rectum as well by freeing the mesorectum off the sacrum.  I  mobilized the peritoneal coverings towards the peritoneal reflection on both the right and left sides of the rectum.  I could see the right and left ureters and stayed away from them.    I skeletonized the inferior mesenteric artery pedicle.  I went down to its takeoff from the aorta.   I isolated the inferior mesenteric vein off of the ligament of Treitz just cephalad to that as well.  After confirming the left ureter was out of the way, I went ahead and ligated the inferior mesenteric artery pedicle with bipolar robotic vessel sealer ~2cm above its takeoff from the aorta.  I did ligate the inferior mesenteric vein in a similar fashion.  We ensured hemostasis. I skeletonized the mesorectum at the junction at the proximal rectum using blunt dissection & bipolar robotic vessel sealer.  I mobilized the left colon in a lateral to medial fashion off the line of Toldt up towards the splenic flexure to ensure good mobilization of the left colon to reach into the pelvis.  I continued my dissection around the mesorectum using the robotic vessel sealer and blunt dissection.  There were significant adhesions noted in the left and right anterior distal mesorectal spaces.  I was able to free the anterior rectum off of the seminal vesicles and prostate with blunt dissection.  I divided down to the level of the pelvic floor circumferentially.  I evaluated the tumor with a digital rectal exam.  I identified a portion of rectum approximately 1 to 2 cm distal to the edge of the tumor.  We then used several green load robotic 45 mm staplers to divide the distal rectum.  Once this was complete I identified the IMA and divided the mesorectum just lateral to this using the robotic vessel sealer.  We then confirmed good perfusion of the colon using firefly and injected intravascularly.  There was good perfusion noted up into the level of the resected mesentery and perfusion noted to the distal rectal stump.  I then turned my  attention to the patient's terminal ileum.  There were some adhesions to the right lower quadrant due to his previous surgery.  These were taken down using sharp dissection.  This allowed for mobilization up to the previously marked ostomy site.  Once this was complete the robot was undocked and the 12 mm right upper quadrant port was converted into an ostomy site.  An [place] wound protector was placed.  The distal rectum was brought out through this and the colon was transected over a pursestring device and 2-0 Prolene suture.  A 29 mm EEA anvil was placed into the remaining colon and the pursestring was tied tightly around this.  This was cleared of surrounding fat and then placed back into the abdomen.  An anastomosis was created using the 29 mm EEA stapler under laparoscopic visualization.  There was no tension noted on the anastomosis.  There was no leak when tested with insufflation under irrigation.  The anastomosis rests approximately 2 cm from the proximal sphincter complex.  At this point a 63 [place] Blake drain was placed in the pelvis and brought out through the left upper quadrant port site.  This was secured with a 2-0 nylon suture.  The  omentum was brought down over the abdominal contents and the terminal ileum was brought out through the right upper quadrant site.  Proximal and distal limbs were confirmed and a 16 French red rubber catheter was placed under the loop and secured with a 2-0 silk suture.  Once this was complete the remaining port sites were closed using interrupted 4-0 Vicryl sutures and Dermabond.  The ostomy was matured in standard [place] fashion.  An ostomy appliance was placed.  Appropriate dressings were placed and the patient was awakened from anesthesia and sent to the postanesthesia care unit in stable condition.  All counts were correct per operating room staff.  An MD assistant was necessary for tissue manipulation, retraction and positioning due to the complexity of the  case and hospital policies

## 2021-07-17 NOTE — Anesthesia Postprocedure Evaluation (Signed)
Anesthesia Post Note  Patient: Steven [name]  Procedure(s) Performed: XI ROBOTIC ASSISTED LOWER ANTERIOR RESECTION DIVERTING ILEOSTOMY     Patient location during evaluation: PACU Anesthesia Type: General Level of consciousness: sedated Pain management: pain level controlled Vital Signs Assessment: post-procedure vital signs reviewed and stable Respiratory status: spontaneous breathing Cardiovascular status: stable Postop Assessment: no apparent nausea or vomiting Anesthetic complications: no   No notable events documented.  Last Vitals:  Vitals:   07/17/21 1615 07/17/21 1630  BP: 120/70 136/77  Pulse: 94 96  Resp: 17 15  Temp:    SpO2: 100% 95%    Last Pain:  Vitals:   07/17/21 1630  TempSrc:   PainSc: [name]

## 2021-07-17 NOTE — H&P (Signed)
History of Present Illness: Steven [name] is a 50 y.o. male who is seen today as an office follow up for Rectal Cancer.  Patient was diagnosed with a recurrent PE and underwent a work-up with hematology.  CT scan showed a mass in the rectum.  He underwent colonoscopy in early June 2022.  This showed a distal rectal mass.  Biopsies showed adenocarcinoma.  CT scans of the chest abdomen pelvis showed no signs of metastatic disease but a possible enlarged perirectal lymph node.  MRI shows a 4 mm perirectal lymph node and a T3b posterior rectal tumor approximately 2 cm from the anal verge.  Patient has noticed some rectal bleeding but is otherwise asymptomatic.  Past surgical history is significant for open appendectomy as a teenager.  He completed neoadjuvant radiation therapy on May 10, 2021.       Past Medical History:  Diagnosis Date   Anxiety     Asthma     Depression     Diabetes mellitus without complication (HCC)     HLD (hyperlipidemia)     Hypertension     Kidney stones     OSA (obstructive sleep apnea)      CPAP         Past Surgical History:  Procedure Laterality Date   APPENDECTOMY       CYST EXCISION        left neck   WISDOM TOOTH EXTRACTION             Family History  Problem Relation Age of Onset   Diabetes Mellitus II Mother     Breast cancer Mother 86   Thrombophlebitis Father     Breast cancer Maternal Grandmother 66   Diabetes Maternal Grandmother     Breast cancer Maternal Aunt 60   Diabetes Maternal Aunt     Hypertension Paternal Grandmother     Colon cancer Neg Hx     Esophageal cancer Neg Hx     Inflammatory bowel disease Neg Hx     Liver disease Neg Hx     Pancreatic cancer Neg Hx     Rectal cancer Neg Hx     Stomach cancer Neg Hx      Social History         Socioeconomic History   Marital status: Married      Spouse name: Not on file   Number of children: 3   Years of education: Not on file   Highest education level: Not on file   Occupational History   Occupation: Gaffer  Tobacco Use   Smoking status: Never   Smokeless tobacco: Never  Vaping Use   Vaping Use: Never used  Substance and Sexual Activity   Alcohol use: Yes      Comment: 1 beer every 3-4 months   Drug use: Never   Sexual activity: Not on file  Other Topics Concern   Not on file  Social History Narrative   Not on file    Social Determinants of Health    Financial Resource Strain: Not on file  Food Insecurity: Not on file  Transportation Needs: Not on file  Physical Activity: Not on file  Stress: Not on file  Social Connections: Not on file  Intimate Partner Violence: Not on file      Current Outpatient Medications:    acetaminophen (TYLENOL) 500 MG tablet, Take 1,000 mg by mouth every 6 (six) hours as needed for mild pain., Disp: , Rfl:  albuterol (VENTOLIN HFA) 108 (90 Base) MCG/ACT inhaler, Inhale 2 puffs into the lungs every 4 (four) hours as needed for wheezing., Disp: , Rfl:    ALPRAZolam (XANAX) 0.25 MG tablet, Take 0.25 mg by mouth daily as needed for anxiety or sleep., Disp: , Rfl:    blood glucose meter kit and supplies KIT, Dispense based on patient and insurance preference. Use up to four times daily as directed. (FOR ICD-9 250.00, 250.01)., Disp: 1 each, Rfl: 0   capecitabine (XELODA) 500 MG tablet, Take Monday through Friday during radiation. Take 4 tablets in AM and 3 tablets in PM. Taken within 30 minutes after meals., Disp: 150 tablet, Rfl: 0   Cinnamon 500 MG capsule, See admin instructions. 1000 mg daily, Disp: , Rfl:    fenofibrate 160 MG tablet, Take 160 mg by mouth daily., Disp: , Rfl:    Ferrous Sulfate (IRON) 325 (65 Fe) MG TABS, Take 325 mg by mouth daily., Disp: , Rfl:    hydrocortisone (ANUSOL-HC) 25 MG suppository, Place 1 suppository (25 mg total) rectally 2 (two) times daily., Disp: 12 suppository, Rfl: 2   irbesartan (AVAPRO) 150 MG tablet, Take 150 mg by mouth daily., Disp: , Rfl:    meloxicam  (MOBIC) 15 MG tablet, Take 15 mg by mouth at bedtime. , Disp: , Rfl:    ondansetron (ZOFRAN) 8 MG tablet, Take 1 tablet (8 mg total) by mouth 2 (two) times daily as needed (Nausea or vomiting)., Disp: 30 tablet, Rfl: 1   polycarbophil (FIBERCON) 625 MG tablet, Take 625 mg by mouth daily. 2 tabs per day, Disp: , Rfl:    predniSONE (DELTASONE) 50 MG tablet, Take one 50 mg tablet by mouth 13 hours prior to scan, one 50 mg tablet by mouth 7 hours prior to scan and one 50 mg tablet 1 hour before scan., Disp: 3 tablet, Rfl: 0   prochlorperazine (COMPAZINE) 10 MG tablet, Take 1 tablet (10 mg total) by mouth every 6 (six) hours as needed (Nausea or vomiting)., Disp: 30 tablet, Rfl: 1   tamsulosin (FLOMAX) 0.4 MG CAPS capsule, Take 1 capsule (0.4 mg total) by mouth daily. (Patient not taking: Reported on 05/16/2021), Disp: 30 capsule, Rfl: 1   tetrahydrozoline-zinc (VISINE-AC) 0.05-0.25 % ophthalmic solution, Place 2 drops into both eyes as needed (allergy)., Disp: , Rfl:    XARELTO 20 MG TABS tablet, Take 20 mg by mouth daily., Disp: , Rfl:    XIGDUO XR 01-999 MG TB24, Take 2 tablets by mouth daily., Disp: , Rfl:   Review of Systems - General ROS: negative for - chills or fever Respiratory ROS: no cough, shortness of breath, or wheezing Cardiovascular ROS: no chest pain or dyspnea on exertion Gastrointestinal ROS: no abdominal pain, change in bowel habits, or black or bloody stools  Objective:     BP (!) 158/90   Pulse 99   Temp 97.9 F (36.6 C) (Oral)   Resp 18   Ht 5' 10"  (1.778 m)   Wt 95.3 kg   SpO2 100%   BMI 30.13 kg/m   General appearance - alert, well appearing, and in no distress Chest - clear to auscultation, no wheezes, rales or rhonchi, symmetric air entry Heart - normal rate and regular rhythm Abdomen - soft, nontender, nondistended, no masses or organomegaly Rectal -mass approximately 2 to 3 cm from the top of the anal canal on digital rectal exam     Labs, Imaging and Diagnostic  Testing:   MRI 03/15/21  IMPRESSION:  Rectal adenocarcinoma T stage: T3b   Rectal adenocarcinoma N stage:  0   Distance from tumor to the internal anal sphincter is 2.2 cm.   Assessment and Plan:  Diagnoses and all orders for this visit:   Malignant neoplasm of rectum (CMS-HCC)   We have discussed surgery in detail.  We will plan on performing this October 10 through November 4.  We discussed that he would need a coloanal anastomosis and a diverting loop ileostomy.  We discussed the possible need for adjuvant chemotherapy and then closure of the loop ileostomy after that.  All questions were answered.  We discussed the entire surgery in detail.   The surgery and anatomy were described to the patient as well as the risks of surgery and the possible complications.  These include: Bleeding, deep abdominal infections and possible wound complications such as hernia and infection, damage to adjacent structures, leak of surgical connections, which can lead to other surgeries and possibly an ostomy, possible need for other procedures, such as abscess drains in radiology, possible prolonged hospital stay, possible diarrhea from removal of part of the colon, possible constipation from narcotics, possible bowel, bladder or sexual dysfunction if having rectal surgery, prolonged fatigue/weakness or appetite loss, possible early recurrence of of disease, possible complications of their medical problems such as heart disease or arrhythmias or lung problems, death (less than 1%). I believe the patient understands and wishes to proceed with the surgery.

## 2021-07-17 NOTE — Transfer of Care (Signed)
Immediate Anesthesia Transfer of Care Note  Patient: Steven [name]  Procedure(s) Performed: XI ROBOTIC ASSISTED LOWER ANTERIOR RESECTION DIVERTING ILEOSTOMY  Patient Location: PACU  Anesthesia Type:General  Level of Consciousness: drowsy and patient cooperative  Airway & Oxygen Therapy: Patient Spontanous Breathing and Patient connected to face mask oxygen  Post-op Assessment: Report given to RN and Post -op Vital signs reviewed and stable  Post vital signs: Reviewed and stable  Last Vitals:  Vitals Value Taken Time  BP 132/74 07/17/21 1608  Temp    Pulse 94 07/17/21 1612  Resp 16 07/17/21 1612  SpO2 100 % 07/17/21 1612  Vitals shown include unvalidated device data.  Last Pain:  Vitals:   07/17/21 1051  TempSrc:   PainSc: 0-No pain         Complications: No notable events documented.

## 2021-07-18 ENCOUNTER — Encounter (HOSPITAL_COMMUNITY): Payer: Self-pay | Admitting: General Surgery

## 2021-07-18 LAB — BASIC METABOLIC PANEL WITH GFR
Anion gap: 8 (ref 5–15)
BUN: 13 mg/dL (ref 6–20)
CO2: 24 mmol/L (ref 22–32)
Calcium: 8.2 mg/dL — ABNORMAL LOW (ref 8.9–10.3)
Chloride: 102 mmol/L (ref 98–111)
Creatinine, Ser: 1.05 mg/dL (ref 0.61–1.24)
GFR, Estimated: >60 mL/min
Glucose, Bld: 132 mg/dL — ABNORMAL HIGH (ref 70–99)
Potassium: 4.2 mmol/L (ref 3.5–5.1)
Sodium: 134 mmol/L — ABNORMAL LOW (ref 135–145)

## 2021-07-18 LAB — CBC
HCT: 37.3 % — ABNORMAL LOW (ref 39.0–52.0)
Hemoglobin: 12.7 g/dL — ABNORMAL LOW (ref 13.0–17.0)
MCH: 28.3 pg (ref 26.0–34.0)
MCHC: 34 g/dL (ref 30.0–36.0)
MCV: 83.1 fL (ref 80.0–100.0)
Platelets: 160 K/uL (ref 150–400)
RBC: 4.49 MIL/uL (ref 4.22–5.81)
RDW: 14.1 % (ref 11.5–15.5)
WBC: 5.4 K/uL (ref 4.0–10.5)
nRBC: 0 % (ref 0.0–0.2)

## 2021-07-18 LAB — GLUCOSE, CAPILLARY
Glucose-Capillary: 113 mg/dL — ABNORMAL HIGH (ref 70–99)
Glucose-Capillary: 115 mg/dL — ABNORMAL HIGH (ref 70–99)
Glucose-Capillary: 141 mg/dL — ABNORMAL HIGH (ref 70–99)
Glucose-Capillary: 169 mg/dL — ABNORMAL HIGH (ref 70–99)

## 2021-07-18 MED ORDER — CHLORHEXIDINE GLUCONATE CLOTH 2 % EX PADS
6.0000 | MEDICATED_PAD | Freq: Every day | CUTANEOUS | Status: DC
  Administered 2021-07-18: 6 via TOPICAL

## 2021-07-18 NOTE — Progress Notes (Signed)
Mobility Specialist - Progress Note    07/18/21 1116  Mobility  Activity Ambulated in hall  Level of Assistance Contact guard assist, steadying assist  Assistive Device Front wheel walker  Distance Ambulated (ft) 200 ft  Mobility Ambulated with assistance in hallway  Mobility Response Tolerated well  Mobility performed by Mobility specialist  $Mobility charge 1 Mobility   Upon entry pt was agreeable to ambulate and practiced log roll technique to sit EOB. Pt c/o of abdominal pain and stated feeling dizzy secondary to his medication. Pt sat for ~1 minute prior to standing and stated dizziness did not worsen. Pt used RW to ambulate 200 ft in hallway and did not report any complaints of pain or SOB. Pt returned to room after session and was left in recliner with call bell at side. RN informed of session.   [place] Specialist Acute Rehabilitation Services Phone: [phone] 07/18/21, 11:20 AM

## 2021-07-18 NOTE — Consult Note (Addendum)
[facility] Nurse ostomy consult note Stoma type/location: Ileostomy surgery performed yesterday. Current pouch is intact with large amt liquid brown stool; emptied 250cc and told patient to call for assistance to avoid overfilling and leakage.  Stomal assessment/size: Visualized stoma through the pouch; stoma is red and viable, red rubber rod in place. Ostomy pouching: 2pc.  Education provided: Pt states he is familiar with pouch application and emptying since his daughter previously had an ostomy when she was young. Educational materials and 5 sets of barrier rings, Kellie Simmering # [phone]), pouches Kellie Simmering # 2) and wafers Kellie Simmering # [phone]) left at the bedside.  I will perform a pouch change demonstration and teaching session tomorrow AM at 09:30 as requested, when Pt's wife will be present. Enrolled patient in [place] program: Not yet  Thank-you,  Julien Girt MSN, [place], [place], [place], [place]

## 2021-07-18 NOTE — Progress Notes (Signed)
Pharmacy Brief Note - Alvimopan (Entereg)  The standing order set for alvimopan (Entereg) now includes an automatic order to discontinue the drug after the patient has had a bowel movement.  The change was approved by the [employer] and the Medical Executive Committee.    This patient has had a adequate ostomy output documented by nursing.  Therefore, alvimopan has been discontinued.  If there are questions, please contact the pharmacy at [phone].  Thank you  Gretta Arab PharmD, BCPS 07/18/2021 11:40 AM

## 2021-07-18 NOTE — Progress Notes (Signed)
1 Day Post-Op Robotic assisted LAR with diverting loop ileostomy Subjective: No nausea, ostomy functioning.  Pain controlled.    Objective: Vital signs in last 24 hours: Temp:  [97.7 F (36.5 C)-98.6 F (37 C)] 98.5 F (36.9 C) (10/20 0519) Pulse Rate:  [93-103] 93 (10/20 0519) Resp:  [13-18] 18 (10/20 0519) BP: (101-158)/(58-90) 120/69 (10/20 0519) SpO2:  [95 %-100 %] 98 % (10/20 0519) Weight:  [95.3 kg] 95.3 kg (10/19 1051)   Intake/Output from previous day: 10/19 0701 - 10/20 0700 In: 4602.5 [P.O.:880; I.V.:3522.5; IV Piggyback:200] Out: 9357 [Urine:2750; Drains:198; Stool:650; Blood:50] Intake/Output this shift: Total I/O In: -  Out: 350 [Stool:350]   General appearance: alert and cooperative GI: soft, mildly distended, appropiately tender  Incision: no significant drainage  Lab Results:  Recent Labs    07/18/21 0423  WBC 5.4  HGB 12.7*  HCT 37.3*  PLT 160   BMET Recent Labs    07/18/21 0423  NA 134*  K 4.2  CL 102  CO2 24  GLUCOSE 132*  BUN 13  CREATININE 1.05  CALCIUM 8.2*   PT/INR No results for input(s): LABPROT, INR in the last 72 hours. ABG No results for input(s): PHART, HCO3 in the last 72 hours.  Invalid input(s): PCO2, PO2  MEDS, Scheduled  acetaminophen  1,000 mg Oral Q6H   alvimopan  12 mg Oral BID   Chlorhexidine Gluconate Cloth  6 each Topical Daily   dapagliflozin propanediol  5 mg Oral BID WC   And   metFORMIN  1,000 mg Oral BID WC   enoxaparin (LOVENOX) injection  40 mg Subcutaneous Q24H   feeding supplement  237 mL Oral BID BM   gabapentin  300 mg Oral BID   insulin aspart  0-20 Units Subcutaneous TID WC   insulin aspart  0-5 Units Subcutaneous QHS   [START ON 07/19/2021] irbesartan  150 mg Oral Daily   saccharomyces boulardii  250 mg Oral BID    Studies/Results: No results found.  Assessment: s/p Procedure(s): XI ROBOTIC ASSISTED LOWER ANTERIOR RESECTION DIVERTING ILEOSTOMY Patient Active Problem List    Diagnosis Date Noted   Rectal cancer ([place]) 07/17/2021   Cancer related pain 04/30/2021   Tenesmus 04/16/2021   Chemotherapy-induced nausea 04/03/2021   Adenocarcinoma of rectum ([place]) 03/21/2021   Colon cancer screening 12/26/2020   Abnormal finding on CT scan 12/26/2020   History of rectal bleeding 12/26/2020   Gastroesophageal reflux disease 12/26/2020   Pulmonary embolism ([place]) 07/05/2020   Diabetes mellitus due to underlying condition, uncontrolled, with hyperglycemia, without long-term current use of insulin ([place]) 04/26/2019   Obesity (BMI 30-39.9) 04/26/2019   Asthma    Anxiety    OSA (obstructive sleep apnea)    HLD (hyperlipidemia)    Bilateral pulmonary embolism ([place]) 04/24/2019    Expected post op course  Plan: Advance diet to soft foods SL IVF's Ambulate in hall Cont ostomy teaching Recheck labs in AM  H/o: PE: restart Alen Blew most likely on Fri Diabetes: SSI insulin, restart home meds once tolerating a diet Asthma: Albuterol PRN Anxiety: Xanax PRN   LOS: 1 day     .Rosario Adie, MD [facility], [place]    07/18/2021 9:21 AM

## 2021-07-19 LAB — BASIC METABOLIC PANEL
Anion gap: 10 (ref 5–15)
BUN: 13 mg/dL (ref 6–20)
CO2: 23 mmol/L (ref 22–32)
Calcium: 8.3 mg/dL — ABNORMAL LOW (ref 8.9–10.3)
Chloride: 102 mmol/L (ref 98–111)
Creatinine, Ser: 1.04 mg/dL (ref 0.61–1.24)
GFR, Estimated: >60 mL/min (ref >60)
Glucose, Bld: 112 mg/dL — ABNORMAL HIGH (ref 70–99)
Potassium: 4.1 mmol/L (ref 3.5–5.1)
Sodium: 135 mmol/L (ref 135–145)

## 2021-07-19 LAB — CBC
HCT: 39.4 % (ref 39.0–52.0)
Hemoglobin: 13.1 g/dL (ref 13.0–17.0)
MCH: 28.6 pg (ref 26.0–34.0)
MCHC: 33.2 g/dL (ref 30.0–36.0)
MCV: 86 fL (ref 80.0–100.0)
Platelets: 149 10*3/uL — ABNORMAL LOW (ref 150–400)
RBC: 4.58 MIL/uL (ref 4.22–5.81)
RDW: 14.4 % (ref 11.5–15.5)
WBC: 5.3 10*3/uL (ref 4.0–10.5)
nRBC: 0 % (ref 0.0–0.2)

## 2021-07-19 LAB — GLUCOSE, CAPILLARY
Glucose-Capillary: 80 mg/dL (ref 70–99)
Glucose-Capillary: 148 mg/dL — ABNORMAL HIGH (ref 70–99)
Glucose-Capillary: 202 mg/dL — ABNORMAL HIGH (ref 70–99)
Glucose-Capillary: 201 mg/dL — ABNORMAL HIGH (ref 70–99)

## 2021-07-19 MED ORDER — RIVAROXABAN 20 MG PO TABS
20.0000 mg | ORAL_TABLET | Freq: Every day | ORAL | Status: DC
  Administered 2021-07-19 – 2021-07-20 (×2): 20 mg via ORAL
  Filled 2021-07-19 (×2): qty 1

## 2021-07-19 MED ORDER — TRAMADOL HCL 50 MG PO TABS: 50.0000 mg | ORAL_TABLET | Freq: Four times a day (QID) | ORAL | 0 refills | Status: DC | PRN

## 2021-07-19 MED ORDER — CALCIUM POLYCARBOPHIL 625 MG PO TABS
625.0000 mg | ORAL_TABLET | Freq: Three times a day (TID) | ORAL | Status: DC
  Administered 2021-07-19 – 2021-07-21 (×7): 625 mg via ORAL
  Filled 2021-07-19 (×7): qty 1

## 2021-07-19 NOTE — Consult Note (Addendum)
[facility] Nurse ostomy consult note Wife present at the bedside for pouch change demonstration and teaching session. Pt and wife state they are familiar with pouch application and emptying since their daughter previously had an ostomy when she was young. Stoma type/location: Stoma is red and viable, slightly above skin level, 1 1/2 inches, red rubber rod in place.  Peristomal assessment: intact Output: 50cc liquid brown stool Ostomy pouching: 2pc.  Education provided: Current pouch is leaking behind the barrier.  Added barrier ring to attempt to maintain a seal. Demonstrated pouch change using 2 piece pouch and barrier ring.  Pt was able to open and close velcro to empty. Reviewed pouching routines and ordering supplies.  Discussed dietary precautions and avoiding dehydration.  Educational materials and 5 sets of barrier rings, Kellie Simmering # [phone]), pouches Kellie Simmering # 2) and wafers Kellie Simmering # [phone]) left at the bedside [place] team is not available on the weekends.  We will perform another visit on Monday if patient is still in the hospital at that time.  Enrolled patient in [place] program: Yes  Thank-you,  Julien Girt MSN, [place], [place], [place], [place]

## 2021-07-19 NOTE — TOC Transition Note (Signed)
Transition of Care [facility]) - CM/SW Discharge Note   Patient Details  Name: Steven [name] MRN: [id number] Date of Birth: 1971/01/19  Transition of Care [facility]) CM/SW Contact:  Lennart Pall, LCSW Phone Number: 07/19/2021, 12:41 PM   Clinical Narrative:    Met with pt an spouse to discuss orders placed for [facility].  Pt agreeable and has no agency preference.  Referral placed with [facility].  No further TOC needs.   Final next level of care: [place] Barriers to Discharge: Continued Medical Work up   Patient Goals and CMS Choice Patient states their goals for this hospitalization and ongoing recovery are:: return home      Discharge Placement                       Discharge Plan and Services                DME Arranged: Ostomy supplies         HH Arranged: RN [place] Agency: [place] Date [place]: 07/19/21 Time HH Agency Contacted: 1100 Representative spoke with at [place]: [place] ([place]) Interventions     Readmission Risk Interventions No flowsheet data found.

## 2021-07-19 NOTE — Progress Notes (Signed)
   07/19/21 1422  Mobility  Activity Ambulated in hall  Level of Assistance Modified independent, requires aide device or extra time  Assistive Device Front wheel walker  Distance Ambulated (ft) 1200 ft  Mobility Ambulated with assistance in hallway  Mobility Response Tolerated well  Mobility performed by Mobility specialist  $Mobility charge 1 Mobility   Pt agreeable to mobilizing this afternoon. Stated he was getting ready to ambulate PTA. Pt ambulated in the hall about 1220ft with RW, tolerated well. Pt noted pain in the lower abdomen, about 3-4/10 during ambulation, but believes this is secondary to gas. Pt left in chair with call bell at side and family member present. RN notified of session.   [place] Specialist Acute Rehab Services Office: [phone]

## 2021-07-19 NOTE — Progress Notes (Signed)
2 Days Post-Op Robotic assisted LAR with diverting loop ileostomy Subjective: No nausea, ostomy functioning.  Pain controlled.  Tolerating a diet.  Foley out, hasn't urinated yet  Objective: Vital signs in last 24 hours: Temp:  [97.8 F (36.6 C)-98.2 F (36.8 C)] 97.9 F (36.6 C) (10/21 0430) Pulse Rate:  [95-98] 98 (10/21 0430) Resp:  [18-20] 20 (10/21 0430) BP: (118-132)/(71-79) 118/74 (10/21 0430) SpO2:  [96 %-98 %] 97 % (10/21 0430)   Intake/Output from previous day: 10/20 0701 - 10/21 0700 In: 1236.1 [P.O.:1200; I.V.:36.1] Out: 4481 [Urine:2650; Drains:115; EHUDJ:4970] Intake/Output this shift: No intake/output data recorded.   General appearance: alert and cooperative GI: soft, non-distended, appropiately tender  Incision: no significant drainage  Lab Results:  Recent Labs    07/18/21 0423 07/19/21 0405  WBC 5.4 5.3  HGB 12.7* 13.1  HCT 37.3* 39.4  PLT 160 149*    BMET Recent Labs    07/18/21 0423 07/19/21 0405  NA 134* 135  K 4.2 4.1  CL 102 102  CO2 24 23  GLUCOSE 132* 112*  BUN 13 13  CREATININE 1.05 1.04  CALCIUM 8.2* 8.3*    PT/INR No results for input(s): LABPROT, INR in the last 72 hours. ABG No results for input(s): PHART, HCO3 in the last 72 hours.  Invalid input(s): PCO2, PO2  MEDS, Scheduled  acetaminophen  1,000 mg Oral Q6H   Chlorhexidine Gluconate Cloth  6 each Topical Daily   dapagliflozin propanediol  5 mg Oral BID WC   And   metFORMIN  1,000 mg Oral BID WC   enoxaparin (LOVENOX) injection  40 mg Subcutaneous Q24H   feeding supplement  237 mL Oral BID BM   gabapentin  300 mg Oral BID   insulin aspart  0-20 Units Subcutaneous TID WC   insulin aspart  0-5 Units Subcutaneous QHS   irbesartan  150 mg Oral Daily   polycarbophil  625 mg Oral TID AC   saccharomyces boulardii  250 mg Oral BID    Studies/Results: No results found.  Assessment: s/p Procedure(s): XI ROBOTIC ASSISTED LOWER ANTERIOR RESECTION DIVERTING  ILEOSTOMY Patient Active Problem List   Diagnosis Date Noted   Rectal cancer ([place]) 07/17/2021   Cancer related pain 04/30/2021   Tenesmus 04/16/2021   Chemotherapy-induced nausea 04/03/2021   Adenocarcinoma of rectum ([place]) 03/21/2021   Colon cancer screening 12/26/2020   Abnormal finding on CT scan 12/26/2020   History of rectal bleeding 12/26/2020   Gastroesophageal reflux disease 12/26/2020   Pulmonary embolism ([place]) 07/05/2020   Diabetes mellitus due to underlying condition, uncontrolled, with hyperglycemia, without long-term current use of insulin ([place]) 04/26/2019   Obesity (BMI 30-39.9) 04/26/2019   Asthma    Anxiety    OSA (obstructive sleep apnea)    HLD (hyperlipidemia)    Bilateral pulmonary embolism ([place]) 04/24/2019    Expected post op course  Plan: Cont soft foods SL IVF's, monitor ostomy output and UOP closely High output ileostomy: may be due to initial dumping.  Will add fiber tabs before meals.  If ostomy output still high tom, will add imodium before meals Ambulate in hall Cont ostomy teaching Recheck BMP in AM  H/o: PE: restart Xeralto  Diabetes: SSI insulin, restart home meds  Asthma: Albuterol PRN Anxiety: Xanax PRN  Discharge planning: home health and ostomy clinic apt being arranged.  Can go home when ostomy output is <1 L/day and UOP adequate.     LOS: 2 days     .Rosario Adie,  MD [facility], [place]    07/19/2021 8:06 AM

## 2021-07-19 NOTE — Discharge Instructions (Signed)
SURGERY: POST OP INSTRUCTIONS (Surgery for small bowel obstruction, colon resection, etc)   ######################################################################  EAT Gradually transition to a high fiber diet with a fiber supplement over the next few days after discharge  WALK Walk an hour a day.  Control your pain to do that.    CONTROL PAIN Control pain so that you can walk, sleep, tolerate sneezing/coughing, go up/down stairs.  HAVE A BOWEL MOVEMENT DAILY Keep your bowels regular to avoid problems.  OK to try a laxative to override constipation.  OK to use an antidairrheal to slow down diarrhea.  Call if not better after 2 tries  CALL IF YOU HAVE PROBLEMS/CONCERNS Call if you are still struggling despite following these instructions. Call if you have concerns not answered by these instructions  ######################################################################   DIET Follow a light diet the first few days at home.  Start with a bland diet such as soups, liquids, starchy foods, low fat foods, etc.  If you feel full, bloated, or constipated, stay on a ful liquid or pureed/blenderized diet for a few days until you feel better and no longer constipated. Be sure to drink plenty of fluids every day to avoid getting dehydrated (feeling dizzy, not urinating, etc.). Gradually add a fiber supplement to your diet over the next week.  Gradually get back to a regular solid diet.  Avoid fast food or heavy meals the first week as you are more likely to get nauseated. It is expected for your digestive tract to need a few months to get back to normal.  It is common for your bowel movements and stools to be irregular.  You will have occasional bloating and cramping that should eventually fade away.  Until you are eating solid food normally, off all pain medications, and back to regular activities; your bowels will not be normal. Focus on eating a low-fat, high fiber diet the rest of your life  (See Getting to Good Bowel Health, below).  CARE of your INCISION or WOUND  It is good for closed incisions and even open wounds to be washed every day.  Shower every day.  Short baths are fine.  Wash the incisions and wounds clean with soap & water.    You may leave closed incisions open to air if it is dry.   You may cover the incision with clean gauze & replace it after your daily shower for comfort.  STAPLES: You have skin staples.  Leave them in place & set up an appointment for them to be removed by a surgery office nurse ~10 days after surgery. = 1st week of January 2024    ACTIVITIES as tolerated Start light daily activities --- self-care, walking, climbing stairs-- beginning the day after surgery.  Gradually increase activities as tolerated.  Control your pain to be active.  Stop when you are tired.  Ideally, walk several times a day, eventually an hour a day.   Most people are back to most day-to-day activities in a few weeks.  It takes 4-8 weeks to get back to unrestricted, intense activity. If you can walk 30 minutes without difficulty, it is safe to try more intense activity such as jogging, treadmill, bicycling, low-impact aerobics, swimming, etc. Save the most intensive and strenuous activity for last (Usually 4-8 weeks after surgery) such as sit-ups, heavy lifting, contact sports, etc.  Refrain from any intense heavy lifting or straining until you are off narcotics for pain control.  You will have off days, but things should improve   week-by-week. DO NOT PUSH THROUGH PAIN.  Let pain be your guide: If it hurts to do something, don't do it.  Pain is your body warning you to avoid that activity for another week until the pain goes down. You may drive when you are no longer taking narcotic prescription pain medication, you can comfortably wear a seatbelt, and you can safely make sudden turns/stops to protect yourself without hesitating due to pain. You may have sexual intercourse when it  is comfortable. If it hurts to do something, stop.  MEDICATIONS Take your usually prescribed home medications unless otherwise directed.   Blood thinners:  Usually you can restart any strong blood thinners after the second postoperative day.  It is OK to take aspirin right away.     If you are on strong blood thinners (warfarin/Coumadin, Plavix, Xerelto, Eliquis, Pradaxa, etc), discuss with your surgeon, medicine PCP, and/or cardiologist for instructions on when to restart the blood thinner & if blood monitoring is needed (PT/INR blood check, etc).     PAIN CONTROL Pain after surgery or related to activity is often due to strain/injury to muscle, tendon, nerves and/or incisions.  This pain is usually short-term and will improve in a few months.  To help speed the process of healing and to get back to regular activity more quickly, DO THE FOLLOWING THINGS TOGETHER: Increase activity gradually.  DO NOT PUSH THROUGH PAIN Use Ice and/or Heat Try Gentle Massage and/or Stretching Take over the counter pain medication Take Narcotic prescription pain medication for more severe pain  Good pain control = faster recovery.  It is better to take more medicine to be more active than to stay in bed all day to avoid medications.  Increase activity gradually Avoid heavy lifting at first, then increase to lifting as tolerated over the next 6 weeks. Do not "push through" the pain.  Listen to your body and avoid positions and maneuvers than reproduce the pain.  Wait a few days before trying something more intense Walking an hour a day is encouraged to help your body recover faster and more safely.  Start slowly and stop when getting sore.  If you can walk 30 minutes without stopping or pain, you can try more intense activity (running, jogging, aerobics, cycling, swimming, treadmill, sex, sports, weightlifting, etc.) Remember: If it hurts to do it, then don't do it! Use Ice and/or Heat You will have swelling and  bruising around the incisions.  This will take several weeks to resolve. Ice packs or heating pads (6-8 times a day, 30-60 minutes at a time) will help sooth soreness & bruising. Some people prefer to use ice alone, heat alone, or alternate between ice & heat.  Experiment and see what works best for you.  Consider trying ice for the first few days to help decrease swelling and bruising; then, switch to heat to help relax sore spots and speed recovery. Shower every day.  Short baths are fine.  It feels good!  Keep the incisions and wounds clean with soap & water.   Try Gentle Massage and/or Stretching Massage at the area of pain many times a day Stop if you feel pain - do not overdo it Take over the counter pain medication This helps the muscle and nerve tissues become less irritable and calm down faster Choose ONE of the following over-the-counter anti-inflammatory medications: Acetaminophen 500mg tabs (Tylenol) 1-2 pills with every meal and just before bedtime (avoid if you have liver problems or if you have   acetaminophen in you narcotic prescription) Naproxen 220mg tabs (ex. Aleve, Naprosyn) 1-2 pills twice a day (avoid if you have kidney, stomach, IBD, or bleeding problems) Ibuprofen 200mg tabs (ex. Advil, Motrin) 3-4 pills with every meal and just before bedtime (avoid if you have kidney, stomach, IBD, or bleeding problems) Take with food/snack several times a day as directed for at least 2 weeks to help keep pain / soreness down & more manageable. Take Narcotic prescription pain medication for more severe pain A prescription for strong pain control is often given to you upon discharge (for example: oxycodone/Percocet, hydrocodone/Norco/Vicodin, or tramadol/Ultram) Take your pain medication as prescribed. Be mindful that most narcotic prescriptions contain Tylenol (acetaminophen) as well - avoid taking too much Tylenol. If you are having problems/concerns with the prescription medicine (does  not control pain, nausea, vomiting, rash, itching, etc.), please call us [phone] to see if we need to switch you to a different pain medicine that will work better for you and/or control your side effects better. If you need a refill on your pain medication, you must call the office before 4 pm and on weekdays only.  By federal law, prescriptions for narcotics cannot be called into a pharmacy.  They must be filled out on paper & picked up from our office by the patient or authorized caretaker.  Prescriptions cannot be filled after 4 pm nor on weekends.    WHEN TO CALL US [phone] Severe uncontrolled or worsening pain  Fever over 101 F (38.5 C) Concerns with the incision: Worsening pain, redness, rash/hives, swelling, bleeding, or drainage Reactions / problems with new medications (itching, rash, hives, nausea, etc.) Nausea and/or vomiting Difficulty urinating Difficulty breathing Worsening fatigue, dizziness, lightheadedness, blurred vision Other concerns If you are not getting better after two weeks or are noticing you are getting worse, contact our office [phone] for further advice.  We may need to adjust your medications, re-evaluate you in the office, send you to the emergency room, or see what other things we can do to help. The clinic staff is available to answer your questions during regular business hours (8:30am-5pm).  Please don't hesitate to call and ask to speak to one of our nurses for clinical concerns.    A surgeon from [facility] is always on call at the hospitals 24 hours/day If you have a medical emergency, go to the nearest emergency room or call 911.  FOLLOW UP in our office One the day of your discharge from the hospital (or the next business weekday), please call [facility] to set up or confirm an appointment to see your surgeon in the office for a follow-up appointment.  Usually it is 2-3 weeks after your surgery.   If you  have skin staples at your incision(s), let the office know so we can set up a time in the office for the nurse to remove them (usually around 10 days after surgery). Make sure that you call for appointments the day of discharge (or the next business weekday) from the hospital to ensure a convenient appointment time. IF YOU HAVE DISABILITY OR FAMILY LEAVE FORMS, BRING THEM TO THE OFFICE FOR PROCESSING.  DO NOT GIVE THEM TO YOUR DOCTOR.  [facility] [address], [address], [place], [place]  [address] ? [phone] - Main [phone] - Toll Free,  [phone] - Fax [email or web address]    GETTING TO GOOD BOWEL HEALTH. It is expected for your digestive tract to   need a few months to get back to normal.  It is common for your bowel movements and stools to be irregular.  You will have occasional bloating and cramping that should eventually fade away.  Until you are eating solid food normally, off all pain medications, and back to regular activities; your bowels will not be normal.   Avoiding constipation The goal: ONE SOFT BOWEL MOVEMENT A DAY!    Drink plenty of fluids.  Choose water first. TAKE A FIBER SUPPLEMENT EVERY DAY THE REST OF YOUR LIFE During your first week back home, gradually add back a fiber supplement every day Experiment which form you can tolerate.   There are many forms such as powders, tablets, wafers, gummies, etc Psyllium bran (Metamucil), methylcellulose (Citrucel), Miralax or Glycolax, Benefiber, Flax Seed.  Adjust the dose week-by-week (1/2 dose/day to 6 doses a day) until you are moving your bowels 1-2 times a day.  Cut back the dose or try a different fiber product if it is giving you problems such as diarrhea or bloating. Sometimes a laxative is needed to help jump-start bowels if constipated until the fiber supplement can help regulate your bowels.  If you are tolerating eating & you are farting, it is okay to try a gentle  laxative such as double dose MiraLax, prune juice, or Milk of Magnesia.  Avoid using laxatives too often. Stool softeners can sometimes help counteract the constipating effects of narcotic pain medicines.  It can also cause diarrhea, so avoid using for too long. If you are still constipated despite taking fiber daily, eating solids, and a few doses of laxatives, call our office. Controlling diarrhea Try drinking liquids and eating bland foods for a few days to avoid stressing your intestines further. Avoid dairy products (especially milk & ice cream) for a short time.  The intestines often can lose the ability to digest lactose when stressed. Avoid foods that cause gassiness or bloating.  Typical foods include beans and other legumes, cabbage, broccoli, and dairy foods.  Avoid greasy, spicy, fast foods.  Every person has some sensitivity to other foods, so listen to your body and avoid those foods that trigger problems for you. Probiotics (such as active yogurt, Align, etc) may help repopulate the intestines and colon with normal bacteria and calm down a sensitive digestive tract Adding a fiber supplement gradually can help thicken stools by absorbing excess fluid and retrain the intestines to act more normally.  Slowly increase the dose over a few weeks.  Too much fiber too soon can backfire and cause cramping & bloating. It is okay to try and slow down diarrhea with a few doses of antidiarrheal medicines.   Bismuth subsalicylate (ex. Kayopectate, Pepto Bismol) for a few doses can help control diarrhea.  Avoid if pregnant.   Loperamide (Imodium) can slow down diarrhea.  Start with one tablet (2mg) first.  Avoid if you are having fevers or severe pain.  ILEOSTOMY PATIENTS WILL HAVE CHRONIC DIARRHEA since their colon is not in use.    Drink plenty of liquids.  You will need to drink even more glasses of water/liquid a day to avoid getting dehydrated. Record output from your ileostomy.  Expect to empty  the bag every 3-4 hours at first.  Most people with a permanent ileostomy empty their bag 4-6 times at the least.   Use antidiarrheal medicine (especially Imodium) several times a day to avoid getting dehydrated.  Start with a dose at bedtime & breakfast.  Adjust up or   down as needed.  Increase antidiarrheal medications as directed to avoid emptying the bag more than 8 times a day (every 3 hours). Work with your wound ostomy nurse to learn care for your ostomy.  See ostomy care instructions. TROUBLESHOOTING IRREGULAR BOWELS 1) Start with a soft & bland diet. No spicy, greasy, or fried foods.  2) Avoid gluten/wheat or dairy products from diet to see if symptoms improve. 3) Miralax 17gm or flax seed mixed in 8oz. water or juice-daily. May use 2-4 times a day as needed. 4) Gas-X, Phazyme, etc. as needed for gas & bloating.  5) Prilosec (omeprazole) over-the-counter as needed 6)  Consider probiotics (Align, Activa, etc) to help calm the bowels down  Call your doctor if you are getting worse or not getting better.  Sometimes further testing (cultures, endoscopy, X-ray studies, CT scans, bloodwork, etc.) may be needed to help diagnose and treat the cause of the diarrhea. [facility] [address], [address], [place], [place]  [address] [phone] - Main.    [phone]  - Toll Free.   [phone] - Fax [email or web address]   ###############################   #######################################################  Ostomy Support Information  You've heard that people get along just fine with only one of their eyes, or one of their lungs, or one of their kidneys. But you also know that you have only one intestine and only one bladder, and that leaves you feeling awfully empty, both physically and emotionally: You think no other people go around without part of their intestine with the ends of their intestines sticking out through their abdominal walls.    YOU ARE NOT ALONE.  There are nearly three quarters of a million people in the [place] who have an ostomy; people who have had surgery to remove all or part of their colons or bladders.   There is even a national association, the [employer] with over 350 local affiliated support groups that are organized by volunteers who provide peer support and counseling. [employer] has a toll free telephone num-ber, 800-826-0826 and an educational, interactive website, www.ostomy.org   An ostomy is an opening in the belly (abdominal wall) made by surgery. Ostomates are people who have had this procedure. The opening (stoma) allows the kidney or bowel to grdischarge waste. An external pouch covers the stoma to collect waste. Pouches are are a simple bag and are odor free. Different companies have disposable or reusable pouches to fit one's lifestyle. An ostomy can either be temporary or permanent.   THERE ARE THREE MAIN TYPES OF OSTOMIES Colostomy. A colostomy is a surgically created opening in the large intestine (colon). Ileostomy. An ileostomy is a surgically created opening in the small intestine. Urostomy. A urostomy is a surgically created opening to divert urine away from the bladder.  OSTOMY Care  The following guidelines will make care of your colostomy easier. Keep this information close by for quick reference.  Helpful DIET hints Eat a well-balanced diet including vegetables and fresh fruits. Eat on a regular schedule.  Drink at least 6 to 8 glasses of fluids daily. Eat slowly in a relaxed atmosphere. Chew your food thoroughly. Avoid chewing gum, smoking, and drinking from a straw. This will help decrease the amount of air you swallow, which may help reduce gas. Eating yogurt or drinking buttermilk may help reduce gas.  To control gas at night, do not eat after 8 p.m. This will give your bowel time to quiet down before you go   to bed.  If gas is a problem, you can purchase  Beano. Sprinkle Beano on the first bite of food before eating to reduce gas. It has no flavor and should not change the taste of your food. You can buy Beano over the counter at your local drugstore.  Foods like fish, onions, garlic, broccoli, asparagus, and cabbage produce odor. Although your pouch is odor-proof, if you eat these foods you may notice a stronger odor when emptying your pouch. If this is a concern, you may want to limit these foods in your diet.  If you have an ileostomy, you will have chronic diarrhea & need to drink more liquids to avoid getting dehydrated.  Consider antidiarrheal medicine like imodium (loperamide) or Lomotil to help slow down bowel movements / diarrhea into your ileostomy bag.  GETTING TO GOOD BOWEL HEALTH WITH AN ILEOSTOMY    With the colon bypassed & not in use, you will have small bowel diarrhea.   It is important to thicken & slow your bowel movements down.   The goal: 4-6 small BOWEL MOVEMENTS A DAY It is important to drink plenty of liquids to avoid getting dehydrated  CONTROLLING ILEOSTOMY DIARRHEA  TAKE A FIBER SUPPLEMENT (FiberCon or Benefiner soluble fiber) twice a day - to thicken stools by absorbing excess fluid and retrain the intestines to act more normally.  Slowly increase the dose over a few weeks.  Too much fiber too soon can backfire and cause cramping & bloating.  TAKE AN IRON SUPPLEMENT twice a day to naturally constipate your bowels.  Usually ferrous sulfate 325mg twice a day)  TAKE ANTI-DIARRHEAL MEDICINES: Loperamide (Imodium) can slow down diarrhea.  Start with two tablets (= 4mg) first and then try one tablet every 6 hours.  Can go up to 2 pills four times day (8 pills of 2mg max) Avoid if you are having fevers or severe pain.  If you are not better or start feeling worse, stop all medicines and call your doctor for advice LoMotil (Diphenoxylate / Atropine) is another medicine that can constipate & slow down bowel moevements Pepto  Bismol (bismuth) can gently thicken bowels as well  If diarrhea is worse,: drink plenty of liquids and try simpler foods for a few days to avoid stressing your intestines further. Avoid dairy products (especially milk & ice cream) for a short time.  The intestines often can lose the ability to digest lactose when stressed. Avoid foods that cause gassiness or bloating.  Typical foods include beans and other legumes, cabbage, broccoli, and dairy foods.  Every person has some sensitivity to other foods, so listen to our body and avoid those foods that trigger problems for you.Call your doctor if you are getting worse or not better.  Sometimes further testing (cultures, endoscopy, X-ray studies, bloodwork, etc) may be needed to help diagnose and treat the cause of the diarrhea. Take extra anti-diarrheal medicines (maximum is 8 pills of 2mg loperamide a day)   Tips for POUCHING an OSTOMY   Changing Your Pouch The best time to change your pouch is in the morning, before eating or drinking anything. Your stoma can function at any time, but it will function more after eating or drinking.   Applying the pouching system  Place all your equipment close at hand before removing your pouch.  Wash your hands.  Stand or sit in front of a mirror. Use the position that works best for you. Remember that you must keep the skin around the stoma   wrinkle-free for a good seal.  Gently remove the used pouch (1-piece system) or the pouch and old wafer (2-piece system). Empty the pouch into the toilet. Save the closure clip to use again.  Wash the stoma itself and the skin around the stoma. Your stoma may bleed a little when being washed. This is normal. Rinse and pat dry. You may use a wash cloth or soft paper towels (like Bounty), mild soap (like Dial, Safeguard, or Ivory), and water. Avoid soaps that contain perfumes or lotions.  For a new pouch (1-piece system) or a new wafer (2-piece system), measure your  stoma using the stoma guide in each box of supplies.  Trace the shape of your stoma onto the back of the new pouch or the back of the new wafer. Cut out the opening. Remove the paper backing and set it aside.  Optional: Apply a skin barrier powder to surrounding skin if it is irritated (bare or weeping), and dust off the excess. Optional: Apply a skin-prep wipe (such as Skin Prep or All-Kare) to the skin around the stoma, and let it dry. Do not apply this solution if the skin is irritated (red, tender, or broken) or if you have shaved around the stoma. Optional: Apply a skin barrier paste (such as Stomahesive, Coloplast, or Premium) around the opening cut in the back of the pouch or wafer. Allow it to dry for 30 to 60 seconds.  Hold the pouch (1-piece system) or wafer (2-piece system) with the sticky side toward your body. Make sure the skin around the stoma is wrinkle-free. Center the opening on the stoma, then press firmly to your abdomen (Fig. 4). Look in the mirror to check if you are placing the pouch, or wafer, in the right position. For a 2-piece system, snap the pouch onto the wafer. Make sure it snaps into place securely.  Place your hand over the stoma and the pouch or wafer for about 30 seconds. The heat from your hand can help the pouch or wafer stick to your skin.  Add deodorant (such as Super Banish or Nullo) to your pouch. Other options include food extracts such as vanilla oil and peppermint extract. Add about 10 drops of the deodorant to the pouch. Then apply the closure clamp. Note: Do not use toxic  chemicals or commercial cleaning agents in your pouch. These substances may harm the stoma.  Optional: For extra seal, apply tape to all 4 sides around the pouch or wafer, as if you were framing a picture. You may use any brand of medical adhesive tape. Change your pouch every 5 to 7 days. Change it immediately if a leak occurs.  Wash your hands afterwards.  If you are wearing a  2-piece system, you may use 2 new pouches per week and alternate them. Rinse the pouch with mild soap and warm water and hang it to dry for the next day. Apply the fresh pouch. Alternate the 2 pouches like this for a week. After a week, change the wafer and begin with 2 new pouches. Place the old pouches in a plastic bag, and put them in the trash.   LIVING WITH AN OSTOMY  Emptying Your Pouch Empty your pouch when it is one-third full (of urine, stool, and/or gas). If you wait until your pouch is fuller than this, it will be more difficult to empty and more noticeable. When you empty your pouch, either put toilet paper in the toilet bowl first, or flush the   toilet while you empty the pouch. This will reduce splashing. You can empty the pouch between your legs or to one side while sitting, or while standing or stooping. If you have a 2-piece system, you can snap off the pouch to empty it. Remember that your stoma may function during this time. If you wish to rinse your pouch after you empty it, a turkey baster can be helpful. When using a baster, squirt water up into the pouch through the opening at the bottom. With a 2-piece system, you can snap off the pouch to rinse it. After rinsing  your pouch, empty it into the toilet. When rinsing your pouch at home, put a few granules of Dreft soap in the rinse water. This helps lubricate and freshen your pouch. The inside of your pouch can be sprayed with non-stick cooking oil (Pam spray). This may help reduce stool sticking to the inside of the pouch.  Bathing You may shower or bathe with your pouch on or off. Remember that your stoma may function during this time.  The materials you use to wash your stoma and the skin around it should be clean, but they do not need to be sterile.  Wearing Your Pouch During hot weather, or if you perspire a lot in general, wear a cover over your pouch. This may prevent a rash on your skin under the pouch. Pouch covers are  sold at ostomy supply stores. Wear the pouch inside your underwear for better support. Watch your weight. Any gain or loss of 10 to 15 pounds or more can change the way your pouch fits.  Going Away From Home A collapsible cup (like those that come in travel kits) or a soft plastic squirt bottle with a pull-up top (like a travel bottle for shampoo) can be used for rinsing your pouch when you are away from home. Tilt the opening of the pouch at an upward angle when using a cup to rinse.  Carry wet wipes or extra tissues to use in public bathrooms.  Carry an extra pouching system with you at all times.  Never keep ostomy supplies in the glove compartment of your car. Extreme heat or cold can damage the skin barriers and adhesive wafers on the pouch.  When you travel, carry your ostomy supplies with you at all times. Keep them within easy reach. Do not pack ostomy supplies in baggage that will be checked or otherwise separated from you, because your baggage might be lost. If you're traveling out of the country, it is helpful to have a letter stating that you are carrying ostomy supplies as a medical necessity.  If you need ostomy supplies while traveling, look in the yellow pages of the telephone book under "Surgical Supplies." Or call the local ostomy organization to find out where supplies are available.  Do not let your ostomy supplies get low. Always order new pouches before you use the last one.  Reducing Odor Limit foods such as broccoli, cabbage, onions, fish, and garlic in your diet to help reduce odor. Each time you empty your pouch, carefully clean the opening of the pouch, both inside and outside, with toilet paper. Rinse your pouch 1 or 2 times daily after you empty it (see directions for emptying your pouch and going away from home). Add deodorant (such as Super Banish or Nullo) to your pouch. Use air deodorizers in your bathroom. Do not add aspirin to your pouch. Even though  aspirin can help prevent odor, it   could cause ulcers on your stoma.  When to call the doctor Call the doctor if you have any of the following symptoms: Purple, black, or white stoma Severe cramps lasting more than 6 hours Severe watery discharge from the stoma lasting more than 6 hours No output from the colostomy for 3 days Excessive bleeding from your stoma Swelling of your stoma to more than 1/2-inch larger than usual Pulling inward of your stoma below skin level Severe skin irritation or deep ulcers Bulging or other changes in your abdomen  When to call your ostomy nurse Call your ostomy/enterostomal therapy (WOCN) nurse if any of the following occurs: Frequent leaking of your pouching system Change in size or appearance of your stoma, causing discomfort or problems with your pouch Skin rash or rawness Weight gain or loss that causes problems with your pouch     FREQUENTLY ASKED QUESTIONS   Why haven't you met any of these folks who have an ostomy?  Well, maybe you have! You just did not recognize them because an ostomy doesn't show. It can be kept secret if you wish. Why, maybe some of your best friends, office associates or neighbors have an ostomy ... you never can tell. People facing ostomy surgery have many quality-of-life questions like: Will you bulge? Smell? Make noises? Will you feel waste leaving your body? Will you be a captive of the toilet? Will you starve? Be a social outcast? Get/stay married? Have babies? Easily bathe, go swimming, bend over?  OK, let's look at what you can expect:   Will you bulge?  Remember, without part of the intestine or bladder, and its contents, you should have a flatter tummy than before. You can expect to wear, with little exception, what you wore before surgery ... and this in-cludes tight clothing and bathing suits.   Will you smell?  Today, thanks to modern odor proof pouching systems, you can walk into an ostomy support group  meeting and not smell anything that is foul or offensive. And, for those with an ileostomy or colostomy who are concerned about odor when emptying their pouch, there are in-pouch deodorants that can be used to eliminate any waste odors that may exist.   Will you make noises?  Everyone produces gas, especially if they are an air-swallower. But intestinal sounds that occur from time to time are no differ-ent than a gurgling tummy, and quite often your clothing will muffle any sounds.   Will you feel the waste discharges?  For those with a colostomy or ileostomy there might be a slight pressure when waste leaves your body, but understand that the intestines have no nerve endings, so there will be no unpleasant sensations. Those with a urostomy will probably be unaware of any kidney drainage.   Will you be a captive of the toilet?  Immediately post-op you will spend more time in the bathroom than you will after your body recovers from surgery. Every person is different, but on average those with an ileostomy or urostomy may empty their pouches 4 to 6 times a day; a little  less if you have a colostomy. The average wear time between pouch system changes is 3 to 5 days and the changing process should take less than 30 minutes.   Will I need to be on a special diet? Most people return to their normal diet when they have recovered from surgery. Be sure to chew your food well, eat a well-balanced diet and drink plenty of fluids. If   you experience problems with a certain food, wait a couple of weeks and try it again.  Will there be odor and noises? Pouching systems are designed to be odor-proof or odor-resistant. There are deodorants that can be used in the pouch. Medications are also available to help reduce odor. Limit gas-producing foods and carbonated beverages. You will experience less gas and fewer noises as you heal from surgery.  How much time will it take to care for my ostomy? At first, you may  spend a lot of time learning about your ostomy and how to take care of it. As you become more comfortable and skilled at changing the pouching system, it will take very little time to care for it.   Will I be able to return to work? People with ostomies can perform most jobs. As soon as you have healed from surgery, you should be able to return to work. Heavy lifting (more than 10 pounds) may be discouraged.   What about intimacy? Sexual relationships and intimacy are important and fulfilling aspects of your life. They should continue after ostomy surgery. Intimacy-related concerns should be discussed openly between you and your partner.   Can I wear regular clothing? You do not need to wear special clothing. Ostomy pouches are fairly flat and barely noticeable. Elastic undergarments will not hurt the stoma or prevent the ostomy from functioning.   Can I participate in sports? An ostomy should not limit your involvement in sports. Many people with ostomies are runners, skiers, swimmers or participate in other active lifestyles. Talk with your caregiver first before doing heavy physical activity.  Will you starve?  Not if you follow doctor's orders at each stage of your post-op adjustment. There is no such thing as an "ostomy diet". Some people with an ostomy will be able to eat and tolerate anything; others may find diffi-culty with some foods. Each person is an individual and must determine, by trial, what is best for them. A good practice for all is to drink plenty of water.   Will you be a social outcast?  Have you met anyone who has an ostomy and is a social outcast? Why should you be the first? Only your attitude and self image will effect how you are treated. No confi-dent person is an outcast.    PROFESSIONAL HELP   Resources are available if you need help or have questions about your ostomy.   Specially trained nurses called Wound, Ostomy Continence Nurses (WOCN) are available for  consultation in most major medical centers.  Consider getting an ostomy consult at an outpatient ostomy clinic.   [facility] an [facility] run by an WOCN ostomy nurse at the [facility].  [phone]. [facility] can help set up an appointment   The United Ostomy Association (UOA) is a group made up of many local chapters throughout the [place]. These local groups hold meetings and provide support to prospective and existing ostomates. They sponsor educational events and have qualified visitors to make personal or telephone visits. Contact the UOA for the chapter nearest you and for other educational publications.  More detailed information can be found in Colostomy Guide, a publication of the United Ostomy Association (UOA). Contact UOA at 1-800-826-0826 or visit their web site at www.uoaa.org. The website contains links to other sites, suppliers and resources.  [facility] Secure Start Services: Start at the website to enlist for support.  Your Wound Ostomy (WOCN) nurse may have started this   process. https://[email or web address].[employer].com/en/securestart Secure Start services are designed to support people as they live their lives with an ostomy or neurogenic bladder. Enrolling is easy and at no cost to the patient. We realize that each person's needs and life journey are different. Through Secure Start services, we want to help people live their life, their way.  #######################################################  

## 2021-07-20 LAB — BASIC METABOLIC PANEL
Anion gap: 11 (ref 5–15)
BUN: 15 mg/dL (ref 6–20)
CO2: 23 mmol/L (ref 22–32)
Calcium: 9.1 mg/dL (ref 8.9–10.3)
Chloride: 100 mmol/L (ref 98–111)
Creatinine, Ser: 0.71 mg/dL (ref 0.61–1.24)
GFR, Estimated: >60 mL/min (ref >60)
Glucose, Bld: 165 mg/dL — ABNORMAL HIGH (ref 70–99)
Potassium: 4 mmol/L (ref 3.5–5.1)
Sodium: 134 mmol/L — ABNORMAL LOW (ref 135–145)

## 2021-07-20 LAB — GLUCOSE, CAPILLARY
Glucose-Capillary: 167 mg/dL — ABNORMAL HIGH (ref 70–99)
Glucose-Capillary: 142 mg/dL — ABNORMAL HIGH (ref 70–99)
Glucose-Capillary: 220 mg/dL — ABNORMAL HIGH (ref 70–99)
Glucose-Capillary: 189 mg/dL — ABNORMAL HIGH (ref 70–99)

## 2021-07-20 MED ORDER — LOPERAMIDE HCL 2 MG PO CAPS
4.0000 mg | ORAL_CAPSULE | Freq: Three times a day (TID) | ORAL | Status: DC
  Administered 2021-07-20 – 2021-07-21 (×5): 4 mg via ORAL
  Filled 2021-07-20 (×5): qty 2

## 2021-07-20 NOTE — Progress Notes (Signed)
3 Days Post-Op   Subjective/Chief Complaint: Pt doing well sitting in chair this AM Some bad soreness   Objective: Vital signs in last 24 hours: Temp:  [98.2 F (36.8 C)-99 F (37.2 C)] 98.9 F (37.2 C) (10/22 0518) Pulse Rate:  [98-116] 102 (10/22 0518) Resp:  [16-18] 18 (10/22 0518) BP: (90-129)/(67-83) 129/83 (10/22 0518) SpO2:  [96 %-100 %] 100 % (10/22 0518) Weight:  [91.8 kg] 91.8 kg (10/22 0518) Last BM Date: 07/19/21  Intake/Output from previous day: 10/21 0701 - 10/22 0700 In: 1200 [P.O.:1200] Out: 1605 [Drains:155; ZWCHE:5277] Intake/Output this shift: No intake/output data recorded.  Abd: inc c/d/I, ostomy output thin, JP SS  Lab Results:  Recent Labs    07/18/21 0423 07/19/21 0405  WBC 5.4 5.3  HGB 12.7* 13.1  HCT 37.3* 39.4  PLT 160 149*   BMET Recent Labs    07/19/21 0405 07/20/21 0505  NA 135 134*  K 4.1 4.0  CL 102 100  CO2 23 23  GLUCOSE 112* 165*  BUN 13 15  CREATININE 1.04 0.71  CALCIUM 8.3* 9.1  Anti-infectives: Anti-infectives (From admission, onward)    Start     Dose/Rate Route Frequency Ordered Stop   07/17/21 2200  cefoTEtan (CEFOTAN) 2 g in sodium chloride 0.9 % 100 mL IVPB        2 g 200 mL/hr over 30 Minutes Intravenous Every 12 hours 07/17/21 1735 07/17/21 2126   07/17/21 1030  cefoTEtan (CEFOTAN) 2 g in sodium chloride 0.9 % 100 mL IVPB        2 g 200 mL/hr over 30 Minutes Intravenous On call to O.R. 07/17/21 1022 07/17/21 1200       Assessment/Plan: Cont soft foods SL IVF's, monitor ostomy output and UOP closely High output ileostomy: adding immodium AC/HS, con't fiber Ambulate in hall Cont ostomy teaching Recheck BMP in AM   H/o: PE: on Xeralto  Diabetes: SSI insulin, restart home meds  Asthma: Albuterol PRN Anxiety: Xanax PRN   Discharge planning: home health and ostomy clinic apt being arranged.  Hopefully Can go home when ostomy output is <1 L/day and UOP adequate.     LOS: 3 days    Ralene Ok 07/20/2021

## 2021-07-21 LAB — GLUCOSE, CAPILLARY: Glucose-Capillary: 169 mg/dL — ABNORMAL HIGH (ref 70–99)

## 2021-07-21 MED ORDER — DIPHENOXYLATE-ATROPINE 2.5-0.025 MG/5ML PO LIQD: 5.0000 mL | Freq: Four times a day (QID) | ORAL | Status: DC

## 2021-07-21 MED ORDER — LOPERAMIDE HCL 2 MG PO CAPS: 4.0000 mg | ORAL_CAPSULE | Freq: Three times a day (TID) | ORAL | 0 refills | Status: DC

## 2021-07-21 NOTE — Discharge Summary (Signed)
Physician Discharge Summary  Patient ID: Steven [name] MRN: [id number] DOB/AGE: 50-Dec-1972 50 y.o.  Admit date: 07/17/2021 Discharge date: 07/21/2021  Admission Diagnoses: Rectal cancer s/p robotic assisted LAR, diverting ileostomy  Discharge Diagnoses:  Active Problems:   Rectal cancer [facility])   Discharged Condition: good  Hospital Course: Patient was admitted postoperatively.  Patient was continued on eras protocol.  He was started on a liquid diet advance to a soft diet which he was able to tolerate well.  Patient had good ileostomy output.  This did require some additional fiber as well as Imodium to help slow down the output.  This was appropriate prior to discharge.  Patient otherwise was ambulating well on his own.  He had good pain control. Patient was deemed stable for discharge and discharged home.  Rubber bridge for ileostomy as well as JP drain will be removed.  Consults: None  Significant Diagnostic Studies: None  Treatments: surgery: As above  Discharge Exam: Blood pressure 123/80, pulse 95, temperature 97.8 F (36.6 C), temperature source Oral, resp. rate 18, height _0  (1.778 m), weight 91.8 kg, SpO2 100 %. PE:  Constitutional: No acute distress, conversant, appears states age. Eyes: Anicteric sclerae, moist conjunctiva, no lid lag Lungs: Clear to auscultation bilaterally, normal respiratory effort CV: regular rate and rhythm, no murmurs, no peripheral edema, pedal pulses 2+ GI: Soft, no masses or hepatosplenomegaly, non-tender to palpation, ostomy pink/patent Skin: No rashes, palpation reveals normal turgor Psychiatric: appropriate judgment and insight, oriented to person, place, and time   Disposition: Discharge disposition: 01-Home or Self Care       Discharge Instructions     Diet - low sodium heart healthy   Complete by: As directed    Increase activity slowly   Complete by: As directed       Allergies as of 07/21/2021       Reactions    Contrast Media [iodinated Diagnostic Agents] Hives   As a 50 year old experienced hives and nausea        Medication List     STOP taking these medications    capecitabine 500 MG tablet Commonly known as: XELODA   Iron 325 (65 Fe) MG Tabs   meloxicam 15 MG tablet Commonly known as: MOBIC   metroNIDAZOLE 500 MG tablet Commonly known as: FLAGYL   neomycin 500 MG tablet Commonly known as: MYCIFRADIN   polyethylene glycol powder 17 GM/SCOOP powder Commonly known as: GLYCOLAX/MIRALAX   predniSONE 50 MG tablet Commonly known as: DELTASONE       TAKE these medications    acetaminophen 500 MG tablet Commonly known as: TYLENOL Take 1,000 mg by mouth every 6 (six) hours as needed for mild pain.   albuterol 108 (90 Base) MCG/ACT inhaler Commonly known as: VENTOLIN HFA Inhale 2 puffs into the lungs every 4 (four) hours as needed for wheezing.   ALPRAZolam 0.25 MG tablet Commonly known as: XANAX Take 0.25 mg by mouth daily as needed for anxiety or sleep.   blood glucose meter kit and supplies Kit Dispense based on patient and insurance preference. Use up to four times daily as directed. (FOR ICD-9 250.00, 250.01).   Cinnamon 500 MG capsule Take 500 mg by mouth daily with supper.   fenofibrate 160 MG tablet Take 160 mg by mouth daily.   irbesartan 150 MG tablet Commonly known as: AVAPRO Take 150 mg by mouth daily.   loperamide 2 MG capsule Commonly known as: IMODIUM Take 2 capsules (4 mg total) by  mouth 4 (four) times daily -  before meals and at bedtime.   ondansetron 8 MG tablet Commonly known as: Zofran Take 1 tablet (8 mg total) by mouth 2 (two) times daily as needed (Nausea or vomiting).   polycarbophil 625 MG tablet Commonly known as: FIBERCON Take 1,250 mg by mouth in the morning and at bedtime.   prochlorperazine 10 MG tablet Commonly known as: COMPAZINE Take 1 tablet (10 mg total) by mouth every 6 (six) hours as needed (Nausea or vomiting).    tetrahydrozoline-zinc 0.05-0.25 % ophthalmic solution Commonly known as: VISINE-AC Place 2 drops into both eyes 4 (four) times daily as needed (allergy).   traMADol 50 MG tablet Commonly known as: ULTRAM Take 1-2 tablets (50-100 mg total) by mouth every 6 (six) hours as needed for moderate pain.   Xarelto 20 MG Tabs tablet Generic drug: rivaroxaban Take 20 mg by mouth daily with supper.   Xigduo XR 01-999 MG Tb24 Generic drug: Dapagliflozin-metFORMIN HCl ER Take 1 tablet by mouth 2 (two) times daily with a meal.        Follow-up Information     [facility]. Schedule an appointment as soon as possible for a visit on 07/29/2021.   Specialty: General Surgery Why: we have sent a referral.  You should be called with an apt date.  Please call office if you haven't gotten a call by 10/25 Contact information: [address] [id number] [place]        [name], MD. Schedule an appointment as soon as possible for a visit on 07/29/2021.   Specialties: General Surgery, Colon and Rectal Surgery Contact information: [place] [address] [place], [facility] Follow up.   Specialty: [place] Why: to provide home health nursing visits Contact information: [place]  [address] [phone]                 Signed: Ralene Ok 07/21/2021, 8:15 AM

## 2021-07-21 NOTE — Progress Notes (Signed)
4 Days Post-Op   Subjective/Chief Complaint: Doing well Ostomy output has thickened up since starting immodium at noon yesterday    Objective: Vital signs in last 24 hours: Temp:  [97.6 F (36.4 C)-98.5 F (36.9 C)] 97.8 F (36.6 C) (10/23 0542) Pulse Rate:  [95-108] 95 (10/23 0542) Resp:  [18] 18 (10/23 0542) BP: (91-123)/(61-80) 123/80 (10/23 0542) SpO2:  [96 %-100 %] 100 % (10/23 0542) Last BM Date: 07/20/21  Intake/Output from previous day: 10/22 0701 - 10/23 0700 In: 960 [P.O.:960] Out: 2980 [Urine:1200; Drains:180; VQXIH:0388] Intake/Output this shift: No intake/output data recorded.  Abd: inc c/d/I, ostomy output thin, JP SS  Lab Results:  Recent Labs    07/19/21 0405  WBC 5.3  HGB 13.1  HCT 39.4  PLT 149*   BMET Recent Labs    07/19/21 0405 07/20/21 0505  NA 135 134*  K 4.1 4.0  CL 102 100  CO2 23 23  GLUCOSE 112* 165*  BUN 13 15  CREATININE 1.04 0.71  CALCIUM 8.3* 9.1   PT/INR No results for input(s): LABPROT, INR in the last 72 hours. ABG No results for input(s): PHART, HCO3 in the last 72 hours.  Invalid input(s): PCO2, PO2  Studies/Results: No results found.  Anti-infectives: Anti-infectives (From admission, onward)    Start     Dose/Rate Route Frequency Ordered Stop   07/17/21 2200  cefoTEtan (CEFOTAN) 2 g in sodium chloride 0.9 % 100 mL IVPB        2 g 200 mL/hr over 30 Minutes Intravenous Every 12 hours 07/17/21 1735 07/17/21 2126   07/17/21 1030  cefoTEtan (CEFOTAN) 2 g in sodium chloride 0.9 % 100 mL IVPB        2 g 200 mL/hr over 30 Minutes Intravenous On call to O.R. 07/17/21 1022 07/17/21 1200       Assessment/Plan: Doing great Ostomy has slowed with immodium Will home today with fiber and immodium as prescribed.   H/o: PE: on Xeralto  Diabetes: SSI insulin, restart home meds  Asthma: Albuterol PRN Anxiety: Xanax PRN      LOS: 4 days    Ralene Ok 07/21/2021

## 2021-07-21 NOTE — Progress Notes (Signed)
Assessment unchanged. Pt verbalized understanding of dc instructions through teach back. Independent with emptying ostomy as well as clean up and changing bag. "Red robin" holding stoma in position* removed per Dr Rosendo Gros order as well as JP drain removed. Pt tolerated both well. Wife arrived to carry home. Discharged via wc to front entrance accompanied by wife and NT.  *Changed ostomy bag for patient, minimal denuded skin noted under wafer. Obtained stoma powder and crusted over small areas x 2. Taught pt crusting technique w/verbal understanding. Hydrocolloid Ring placed around stoma and 2-piece bag applied.

## 2021-07-22 DIAGNOSIS — F419 Anxiety disorder, unspecified: Secondary | ICD-10-CM | POA: Diagnosis not present

## 2021-07-22 DIAGNOSIS — G4733 Obstructive sleep apnea (adult) (pediatric): Secondary | ICD-10-CM | POA: Diagnosis not present

## 2021-07-22 DIAGNOSIS — E785 Hyperlipidemia, unspecified: Secondary | ICD-10-CM | POA: Diagnosis not present

## 2021-07-22 DIAGNOSIS — J45909 Unspecified asthma, uncomplicated: Secondary | ICD-10-CM | POA: Diagnosis not present

## 2021-07-22 DIAGNOSIS — Z7901 Long term (current) use of anticoagulants: Secondary | ICD-10-CM | POA: Diagnosis not present

## 2021-07-22 DIAGNOSIS — Z9181 History of falling: Secondary | ICD-10-CM | POA: Diagnosis not present

## 2021-07-22 DIAGNOSIS — I2699 Other pulmonary embolism without acute cor pulmonale: Secondary | ICD-10-CM | POA: Diagnosis not present

## 2021-07-22 DIAGNOSIS — C20 Malignant neoplasm of rectum: Secondary | ICD-10-CM | POA: Diagnosis not present

## 2021-07-22 DIAGNOSIS — F32A Depression, unspecified: Secondary | ICD-10-CM | POA: Diagnosis not present

## 2021-07-22 DIAGNOSIS — Z483 Aftercare following surgery for neoplasm: Secondary | ICD-10-CM | POA: Diagnosis not present

## 2021-07-22 DIAGNOSIS — E119 Type 2 diabetes mellitus without complications: Secondary | ICD-10-CM | POA: Diagnosis not present

## 2021-07-22 DIAGNOSIS — I10 Essential (primary) hypertension: Secondary | ICD-10-CM | POA: Diagnosis not present

## 2021-07-22 DIAGNOSIS — Z7984 Long term (current) use of oral hypoglycemic drugs: Secondary | ICD-10-CM | POA: Diagnosis not present

## 2021-07-22 LAB — SURGICAL PATHOLOGY

## 2021-07-25 DIAGNOSIS — F419 Anxiety disorder, unspecified: Secondary | ICD-10-CM | POA: Diagnosis not present

## 2021-07-25 DIAGNOSIS — Z7901 Long term (current) use of anticoagulants: Secondary | ICD-10-CM | POA: Diagnosis not present

## 2021-07-25 DIAGNOSIS — G4733 Obstructive sleep apnea (adult) (pediatric): Secondary | ICD-10-CM | POA: Diagnosis not present

## 2021-07-25 DIAGNOSIS — Z9181 History of falling: Secondary | ICD-10-CM | POA: Diagnosis not present

## 2021-07-25 DIAGNOSIS — I10 Essential (primary) hypertension: Secondary | ICD-10-CM | POA: Diagnosis not present

## 2021-07-25 DIAGNOSIS — Z7984 Long term (current) use of oral hypoglycemic drugs: Secondary | ICD-10-CM | POA: Diagnosis not present

## 2021-07-25 DIAGNOSIS — J45909 Unspecified asthma, uncomplicated: Secondary | ICD-10-CM | POA: Diagnosis not present

## 2021-07-25 DIAGNOSIS — E785 Hyperlipidemia, unspecified: Secondary | ICD-10-CM | POA: Diagnosis not present

## 2021-07-25 DIAGNOSIS — C20 Malignant neoplasm of rectum: Secondary | ICD-10-CM | POA: Diagnosis not present

## 2021-07-25 DIAGNOSIS — I2699 Other pulmonary embolism without acute cor pulmonale: Secondary | ICD-10-CM | POA: Diagnosis not present

## 2021-07-25 DIAGNOSIS — E119 Type 2 diabetes mellitus without complications: Secondary | ICD-10-CM | POA: Diagnosis not present

## 2021-07-25 DIAGNOSIS — Z483 Aftercare following surgery for neoplasm: Secondary | ICD-10-CM | POA: Diagnosis not present

## 2021-07-25 DIAGNOSIS — F32A Depression, unspecified: Secondary | ICD-10-CM | POA: Diagnosis not present

## 2021-07-26 ENCOUNTER — Other Ambulatory Visit: Payer: Self-pay

## 2021-07-26 ENCOUNTER — Encounter (HOSPITAL_COMMUNITY)
Admission: RE | Admit: 2021-07-26 | Discharge: 2021-07-26 | Disposition: A | Discharge status: Home / Self Care | Payer: BC Managed Care – PPO | Source: Ambulatory Visit | Attending: Nurse Practitioner | Admitting: Nurse Practitioner

## 2021-07-26 DIAGNOSIS — Z932 Ileostomy status: Secondary | ICD-10-CM | POA: Diagnosis not present

## 2021-07-26 DIAGNOSIS — Z432 Encounter for attention to ileostomy: Secondary | ICD-10-CM

## 2021-07-26 DIAGNOSIS — C20 Malignant neoplasm of rectum: Secondary | ICD-10-CM | POA: Diagnosis not present

## 2021-07-26 DIAGNOSIS — L259 Unspecified contact dermatitis, unspecified cause: Secondary | ICD-10-CM | POA: Diagnosis not present

## 2021-07-26 DIAGNOSIS — L24B1 Irritant contact dermatitis related to digestive stoma or fistula: Secondary | ICD-10-CM

## 2021-07-26 NOTE — Progress Notes (Signed)
[facility]   Reason for visit:  LuQ Loop ileostomy HPI:  Rectal CA with ileostomy ROS  Review of Systems  Constitutional:  Positive for fatigue.  Gastrointestinal:        LUQ loop ileostomy  Skin:  Positive for rash.       Contact dermatitis  Psychiatric/Behavioral:  The patient is nervous/anxious.   All other systems reviewed and are negative. Vital signs:  BP (!) 145/78 (BP Location: Right Arm)   Pulse 97   Temp 97.7 F (36.5 C) (Oral)   Resp 18   SpO2 100%  Exam:  Physical Exam Vitals reviewed.  Constitutional:      Appearance: Normal appearance.  Abdominal:     General: Abdomen is flat.       Comments: LUQ ileostomy  Skin:         Comments: LUQ contact dermatitis around stoma  Neurological:     Mental Status: He is alert.    Stoma type/location:  LUQ ileostomy Stomal assessment/size:  1 1/2" pink and moist Peristomal assessment:  contact dermatitis Treatment options for stomal/peristomal skin: barrier ring Output: liquid green stool.  Ostomy pouching: 2 piece 2 3/4" pouch with barrier ring  stoma powder and skin prep Education provided:  He prefers not to use 1 piece pouch.  Tenderness from 4 to 7 o'clock with os pointed down towards 6 o'clock and promoting leaks. Will implement barrier ring.      Impression/dx  Contact dermatitis Discussion  See back in one week.   Plan  Will set up with [facility].     Visit time: 45 minutes.   Domenic Moras FNP-BC

## 2021-07-30 ENCOUNTER — Other Ambulatory Visit: Payer: Self-pay

## 2021-07-30 ENCOUNTER — Encounter: Payer: Self-pay | Admitting: Hematology

## 2021-07-30 ENCOUNTER — Inpatient Hospital Stay: Payer: BC Managed Care – PPO | Attending: Hematology and Oncology | Admitting: Hematology

## 2021-07-30 VITALS — BP 140/75 | HR 101 | Temp 98.5°F | Resp 18 | Wt 205.7 lb

## 2021-07-30 DIAGNOSIS — Z86711 Personal history of pulmonary embolism: Secondary | ICD-10-CM | POA: Insufficient documentation

## 2021-07-30 DIAGNOSIS — Z7901 Long term (current) use of anticoagulants: Secondary | ICD-10-CM | POA: Insufficient documentation

## 2021-07-30 DIAGNOSIS — Z79899 Other long term (current) drug therapy: Secondary | ICD-10-CM | POA: Insufficient documentation

## 2021-07-30 DIAGNOSIS — C20 Malignant neoplasm of rectum: Secondary | ICD-10-CM | POA: Diagnosis not present

## 2021-07-30 DIAGNOSIS — Z9221 Personal history of antineoplastic chemotherapy: Secondary | ICD-10-CM | POA: Diagnosis not present

## 2021-07-30 MED ORDER — CAPECITABINE 500 MG PO TABS: 1000.0000 mg/m2 | ORAL_TABLET | Freq: Two times a day (BID) | ORAL | 1 refills | Status: DC

## 2021-07-30 NOTE — Progress Notes (Signed)
[place]   [phone] [phone]   Clinic Follow up Note   Patient Care Team: Shirline Frees, MD as PCP - General (Family Medicine) [name], MD as Consulting Physician (Hematology and Oncology) Jonnie Finner, RN (Inactive) as Oncology Nurse Navigator  Date of Service:  07/30/2021  CHIEF COMPLAINT: f/u of rectal cancer  CURRENT THERAPY:  Adjuvant Xeloda  ASSESSMENT & PLAN:  Steven [name] is a 50 y.o. male with   1. Adenocarcinoma of rectum, cT3N0M0, ypT0N0, MSS -he underwent CT AP 11/23/20 to rule out malignancy after PE. This showed asymmetric soft tissue in rectum. -colonoscopy 03/05/21 with Dr. Rush Landmark revealed a palpable rectal mass on digital rectal exam, measuring 5 cm on scope. Pathology confirmed adenocarcinoma. -staging CT CAP 03/14/21 showed questionable tiny perirectal lymph nodes, but negative node on staging pelvic MRI 03/15/21. -he received neoadjuvant concurrent chemoradiation 04/03/21-05/10/21. -definitive resection on 07/17/21 under Dr. Marcello Moores. Pathology showed complete response with no residual carcinoma. Lymph nodes and margins negative. -I recommend adjuvant treatment with 4.5 months of Xeloda. He notes he tolerated well previously.  Given his node-negative disease and complete response to neoadjuvant chemoradiation, I do not feel he needs oxaliplatin. We reviewed potential side effects since his dose is a little higher this time. He will take 2000 mg BID two weeks on/one week off. Plan to start 08/19/21. I will call in today   2. Bilateral pulmonary embolism -first episode in 03/2019 and again in 06/2020 -on Xarelto since 07/2020, plan to stop after he completes adjuvant chemo   3. Genetics -given his young age, he qualifies for genetic testing. I will refer him.   PLAN: -genetic referral -begin Xeloda, 2000 mg q12hr 2 weeks on/1 week off on 08/19/21 -lab and f/u week of 09/02/21, in his off week.   No  problem-specific Assessment & Plan notes found for this encounter.   SUMMARY OF ONCOLOGIC HISTORY: Oncology History  Adenocarcinoma of rectum ([place])  03/05/2021 Cancer Staging   Staging form: Colon and Rectum, AJCC 8th Edition - Clinical stage from 03/05/2021: Stage IIA (cT3, cN0, cM0) - Signed by Truitt Merle, MD on 07/29/2021 Stage prefix: Initial diagnosis Total positive nodes: 0    03/21/2021 Initial Diagnosis   Adenocarcinoma of rectum ([place])   03/28/2021 -  Chemotherapy    Patient is on Treatment Plan: COLORECTAL CAPECITABINE + XRT       07/17/2021 Cancer Staging   Staging form: Colon and Rectum, AJCC 8th Edition - Pathologic stage from 07/17/2021: ypT0, pN0, cM0 - Signed by Truitt Merle, MD on 07/29/2021 Stage prefix: Post-therapy Total positive nodes: 0 Residual tumor (R): R0 - None       INTERVAL HISTORY:  Steven [name] is here for a follow up of rectal cancer. He was last seen by Dr. Chryl Heck on 05/16/21. He presents to the clinic accompanied by his wife. He reports some residual pain from his surgery. He notes he is adjusting to ostomy.   All other systems were reviewed with the patient and are negative.  MEDICAL HISTORY:  Past Medical History:  Diagnosis Date   Anemia 02/2021   iron   Anxiety    Asthma    Cancer ([place]) 02/2021   Depression    Diabetes mellitus without complication ([place])    History of kidney stones    HLD (hyperlipidemia)    Hypertension    Kidney stones    Neuromuscular disorder ([place])    from radiation bi lat   OSA (obstructive sleep  apnea)    CPAP   Peripheral vascular disease ([place])    DVT    SURGICAL HISTORY: Past Surgical History:  Procedure Laterality Date   APPENDECTOMY  1989   ag   COLONOSCOPY  03/05/2021   CYST EXCISION  2011   left neck   DIVERTING ILEOSTOMY N/A 07/17/2021   Procedure: DIVERTING ILEOSTOMY;  Surgeon: [name], MD;  Location: [facility];  Service: General;  Laterality: N/A;   WISDOM TOOTH EXTRACTION     XI  ROBOTIC ASSISTED LOWER ANTERIOR RESECTION N/A 07/17/2021   Procedure: XI ROBOTIC ASSISTED LOWER ANTERIOR RESECTION;  Surgeon: [name], MD;  Location: [facility];  Service: General;  Laterality: N/A;    I have reviewed the social history and family history with the patient and they are unchanged from previous note.  ALLERGIES:  is allergic to contrast media [iodinated diagnostic agents].  MEDICATIONS:  Current Outpatient Medications  Medication Sig Dispense Refill   capecitabine (XELODA) 500 MG tablet Take 4 tablets (2,000 mg total) by mouth 2 (two) times daily after a meal. 112 tablet 1   acetaminophen (TYLENOL) 500 MG tablet Take 1,000 mg by mouth every 6 (six) hours as needed for mild pain.     albuterol (VENTOLIN HFA) 108 (90 Base) MCG/ACT inhaler Inhale 2 puffs into the lungs every 4 (four) hours as needed for wheezing.     ALPRAZolam (XANAX) 0.25 MG tablet Take 0.25 mg by mouth daily as needed for anxiety or sleep.     blood glucose meter kit and supplies KIT Dispense based on patient and insurance preference. Use up to four times daily as directed. (FOR ICD-9 250.00, 250.01). 1 each 0   Cinnamon 500 MG capsule Take 500 mg by mouth daily with supper.     fenofibrate 160 MG tablet Take 160 mg by mouth daily.     irbesartan (AVAPRO) 150 MG tablet Take 150 mg by mouth daily.     loperamide (IMODIUM) 2 MG capsule Take 2 capsules (4 mg total) by mouth 4 (four) times daily -  before meals and at bedtime. 30 capsule 0   ondansetron (ZOFRAN) 8 MG tablet Take 1 tablet (8 mg total) by mouth 2 (two) times daily as needed (Nausea or vomiting). 30 tablet 1   polycarbophil (FIBERCON) 625 MG tablet Take 1,250 mg by mouth in the morning and at bedtime.     prochlorperazine (COMPAZINE) 10 MG tablet Take 1 tablet (10 mg total) by mouth every 6 (six) hours as needed (Nausea or vomiting). 30 tablet 1   tetrahydrozoline-zinc (VISINE-AC) 0.05-0.25 % ophthalmic solution Place 2 drops into both eyes 4 (four)  times daily as needed (allergy).     traMADol (ULTRAM) 50 MG tablet Take 1-2 tablets (50-100 mg total) by mouth every 6 (six) hours as needed for moderate pain. 30 tablet 0   XARELTO 20 MG TABS tablet Take 20 mg by mouth daily with supper.     XIGDUO XR 01-999 MG TB24 Take 1 tablet by mouth 2 (two) times daily with a meal.     No current facility-administered medications for this visit.    PHYSICAL EXAMINATION: ECOG PERFORMANCE STATUS: 1 - Symptomatic but completely ambulatory  Vitals:   07/30/21 1211  BP: 140/75  Pulse: (!) 101  Resp: 18  Temp: 98.5 F (36.9 C)  SpO2: 99%   Wt Readings from Last 3 Encounters:  07/30/21 205 lb 11.2 oz (93.3 kg)  07/20/21 202 lb 6.1 oz (91.8 kg)  07/05/21 213  lb (96.6 kg)     GENERAL:alert, no distress and comfortable SKIN: skin color, texture, turgor are normal, no rashes or significant lesions EYES: normal, Conjunctiva are pink and non-injected, sclera clear  ABDOMEN:abdomen soft, non-tender and normal bowel sounds. (+) laparoscopy incision healing well, ostomy in place on left side. NEURO: alert & oriented x 3 with fluent speech, no focal motor/sensory deficits  LABORATORY DATA:  I have reviewed the data as listed CBC Latest Ref Rng & Units 07/19/2021 07/18/2021 07/05/2021  WBC 4.0 - 10.5 K/uL 5.3 5.4 4.2  Hemoglobin 13.0 - 17.0 g/dL 13.1 12.7(L) 15.3  Hematocrit 39.0 - 52.0 % 39.4 37.3(L) 46.2  Platelets 150 - 400 K/uL 149(L) 160 175     CMP Latest Ref Rng & Units 07/20/2021 07/19/2021 07/18/2021  Glucose 70 - 99 mg/dL 165(H) 112(H) 132(H)  BUN 6 - 20 mg/dL 15 13 13   Creatinine 0.61 - 1.24 mg/dL 0.71 1.04 1.05  Sodium 135 - 145 mmol/L 134(L) 135 134(L)  Potassium 3.5 - 5.1 mmol/L 4.0 4.1 4.2  Chloride 98 - 111 mmol/L 100 102 102  CO2 22 - 32 mmol/L 23 23 24   Calcium 8.9 - 10.3 mg/dL 9.1 8.3(L) 8.2(L)  Total Protein 6.5 - 8.1 g/dL - - -  Total Bilirubin 0.3 - 1.2 mg/dL - - -  Alkaline Phos 38 - 126 U/L - - -  AST 15 - 41 U/L - -  -  ALT 0 - 44 U/L - - -      RADIOGRAPHIC STUDIES: I have personally reviewed the radiological images as listed and agreed with the findings in the report. No results found.    Orders Placed This Encounter  Procedures   Ambulatory referral to Genetics    Referral Priority:   Routine    Referral Type:   Consultation    Referral Reason:   Specialty Services Required    Number of Visits Requested:   1   All questions were answered. The patient knows to call the clinic with any problems, questions or concerns. No barriers to learning was detected. The total time spent in the appointment was 30 minutes.     Truitt Merle, MD 07/30/2021   I, Wilburn Mylar, am acting as scribe for Truitt Merle, MD.   I have reviewed the above documentation for accuracy and completeness, and I agree with the above.

## 2021-07-31 ENCOUNTER — Other Ambulatory Visit: Payer: Self-pay

## 2021-07-31 DIAGNOSIS — Z932 Ileostomy status: Secondary | ICD-10-CM | POA: Diagnosis not present

## 2021-07-31 NOTE — Progress Notes (Signed)
The proposed treatment discussed in conference is for discussion purpose only and is not a binding recommendation.  The patients have not been physically examined, or presented with their treatment options.  Therefore, final treatment plans cannot be decided.  

## 2021-08-03 NOTE — Hospital Discharge Follow-Up (Addendum)
Called after discharge 08/03/21  Pt called regarding difficulty urinating and pelvic pain.  He is s/p robotic LAR by Dr. Marcello Moores 07/17/21.  I advised him to take a warm bath.  He also had some flomax from earlier in the year that he took post op.  After the warm bath he was able to void and had relief of symptoms.  However, several hours later he feels this pressure building up again as well as pressure/pain in his tailbone region.  He is going to try a warm bath again.  He was advised to go to the ED if unable to void for assessment of bladder status and probable foley placement.  He also may need CT scan to evaluate if any fluid collections are present near the bladder/tailbone making the bladder irritable.  He is going to go to [facility] ED if he continues to have issues.

## 2021-08-07 ENCOUNTER — Emergency Department (HOSPITAL_COMMUNITY)
Admission: EM | Admit: 2021-08-07 | Discharge: 2021-08-07 | Disposition: A | Discharge status: Home / Self Care | Payer: BC Managed Care – PPO | Attending: Emergency Medicine | Admitting: Emergency Medicine

## 2021-08-07 ENCOUNTER — Telehealth: Payer: Self-pay

## 2021-08-07 ENCOUNTER — Other Ambulatory Visit: Payer: Self-pay

## 2021-08-07 DIAGNOSIS — G4733 Obstructive sleep apnea (adult) (pediatric): Secondary | ICD-10-CM | POA: Diagnosis not present

## 2021-08-07 DIAGNOSIS — Z85048 Personal history of other malignant neoplasm of rectum, rectosigmoid junction, and anus: Secondary | ICD-10-CM | POA: Insufficient documentation

## 2021-08-07 DIAGNOSIS — I2699 Other pulmonary embolism without acute cor pulmonale: Secondary | ICD-10-CM | POA: Diagnosis not present

## 2021-08-07 DIAGNOSIS — J45909 Unspecified asthma, uncomplicated: Secondary | ICD-10-CM | POA: Diagnosis not present

## 2021-08-07 DIAGNOSIS — I10 Essential (primary) hypertension: Secondary | ICD-10-CM | POA: Insufficient documentation

## 2021-08-07 DIAGNOSIS — E785 Hyperlipidemia, unspecified: Secondary | ICD-10-CM | POA: Diagnosis not present

## 2021-08-07 DIAGNOSIS — Z7901 Long term (current) use of anticoagulants: Secondary | ICD-10-CM | POA: Insufficient documentation

## 2021-08-07 DIAGNOSIS — E119 Type 2 diabetes mellitus without complications: Secondary | ICD-10-CM | POA: Diagnosis not present

## 2021-08-07 DIAGNOSIS — Z432 Encounter for attention to ileostomy: Secondary | ICD-10-CM | POA: Insufficient documentation

## 2021-08-07 DIAGNOSIS — Z9181 History of falling: Secondary | ICD-10-CM | POA: Diagnosis not present

## 2021-08-07 DIAGNOSIS — F419 Anxiety disorder, unspecified: Secondary | ICD-10-CM | POA: Diagnosis not present

## 2021-08-07 DIAGNOSIS — Z483 Aftercare following surgery for neoplasm: Secondary | ICD-10-CM | POA: Diagnosis not present

## 2021-08-07 DIAGNOSIS — Z7984 Long term (current) use of oral hypoglycemic drugs: Secondary | ICD-10-CM | POA: Diagnosis not present

## 2021-08-07 DIAGNOSIS — C20 Malignant neoplasm of rectum: Secondary | ICD-10-CM | POA: Diagnosis not present

## 2021-08-07 DIAGNOSIS — Z79899 Other long term (current) drug therapy: Secondary | ICD-10-CM | POA: Diagnosis not present

## 2021-08-07 DIAGNOSIS — F32A Depression, unspecified: Secondary | ICD-10-CM | POA: Diagnosis not present

## 2021-08-07 NOTE — ED Provider Notes (Signed)
[place] DEPT Provider Note   CSN: [id number] Arrival date & time: 08/07/21  1709     History Chief Complaint  Patient presents with   illeostomy improperly draining    Steven [name] is a 50 y.o. male.  Patient presents chief complaint of concern for ileostomy draining improperly.  Patient has a history of rectal cancer now status post low anterior resection about a month ago.  He has been doing well at home.  His home health nurse came and checked on his ileostomy was concerned that there was report of stool coming from the outer edge of the ostomy and not the visible lumen in the center.  Patient otherwise denies any new abdominal pains.  Denies fevers or cough or vomiting or diarrhea.  Has normal output from his ostomy per patient.      Past Medical History:  Diagnosis Date   Anemia 02/2021   iron   Anxiety    Asthma    Cancer ([place]) 02/2021   Depression    Diabetes mellitus without complication ([place])    History of kidney stones    HLD (hyperlipidemia)    Hypertension    Kidney stones    Neuromuscular disorder ([facility])    from radiation bi lat   OSA (obstructive sleep apnea)    CPAP   Peripheral vascular disease ([place])    DVT    Patient Active Problem List   Diagnosis Date Noted   Rectal cancer ([place]) 07/17/2021   Cancer related pain 04/30/2021   Tenesmus 04/16/2021   Chemotherapy-induced nausea 04/03/2021   Adenocarcinoma of rectum ([place]) 03/21/2021   Colon cancer screening 12/26/2020   Abnormal finding on CT scan 12/26/2020   History of rectal bleeding 12/26/2020   Gastroesophageal reflux disease 12/26/2020   Pulmonary embolism ([place]) 07/05/2020   Diabetes mellitus due to underlying condition, uncontrolled, with hyperglycemia, without long-term current use of insulin ([place]) 04/26/2019   Obesity (BMI 30-39.9) 04/26/2019   Asthma    Anxiety    OSA (obstructive sleep apnea)    HLD (hyperlipidemia)    Bilateral pulmonary embolism  ([place]) 04/24/2019    Past Surgical History:  Procedure Laterality Date   APPENDECTOMY  1989   ag   COLONOSCOPY  03/05/2021   CYST EXCISION  2011   left neck   DIVERTING ILEOSTOMY N/A 07/17/2021   Procedure: DIVERTING ILEOSTOMY;  Surgeon: [name], MD;  Location: [facility];  Service: General;  Laterality: N/A;   WISDOM TOOTH EXTRACTION     XI ROBOTIC ASSISTED LOWER ANTERIOR RESECTION N/A 07/17/2021   Procedure: XI ROBOTIC ASSISTED LOWER ANTERIOR RESECTION;  Surgeon: [name], MD;  Location: [facility];  Service: General;  Laterality: N/A;       Family History  Problem Relation Age of Onset   Diabetes Mellitus II Mother    Breast cancer Mother 73   Thrombophlebitis Father    Breast cancer Maternal Grandmother 70   Diabetes Maternal Grandmother    Breast cancer Maternal Aunt 60   Diabetes Maternal Aunt    Hypertension Paternal Grandmother    Colon cancer Neg Hx    Esophageal cancer Neg Hx    Inflammatory bowel disease Neg Hx    Liver disease Neg Hx    Pancreatic cancer Neg Hx    Rectal cancer Neg Hx    Stomach cancer Neg Hx     Social History   Tobacco Use   Smoking status: Never   Smokeless tobacco: Never  Vaping Use  Vaping Use: Never used  Substance Use Topics   Alcohol use: Yes    Comment: 1 beer every 3-4 months   Drug use: Never    Home Medications Prior to Admission medications   Medication Sig Start Date End Date Taking? Authorizing Provider  acetaminophen (TYLENOL) 500 MG tablet Take 1,000 mg by mouth every 6 (six) hours as needed for mild pain.    [provider]  albuterol (VENTOLIN HFA) 108 (90 Base) MCG/ACT inhaler Inhale 2 puffs into the lungs every 4 (four) hours as needed for wheezing. 12/29/18   [provider]  ALPRAZolam Duanne Moron) 0.25 MG tablet Take 0.25 mg by mouth daily as needed for anxiety or sleep.    [provider]  blood glucose meter kit and supplies KIT Dispense based on patient and insurance preference.  Use up to four times daily as directed. (FOR ICD-9 250.00, 250.01). 07/08/20   Little Ishikawa, MD  capecitabine (XELODA) 500 MG tablet Take 4 tablets (2,000 mg total) by mouth 2 (two) times daily after a meal. 07/30/21   Truitt Merle, MD  Cinnamon 500 MG capsule Take 500 mg by mouth daily with supper.    [provider]  fenofibrate 160 MG tablet Take 160 mg by mouth daily. 02/23/19   [provider]  irbesartan (AVAPRO) 150 MG tablet Take 150 mg by mouth daily. 11/20/20   [provider]  loperamide (IMODIUM) 2 MG capsule Take 2 capsules (4 mg total) by mouth 4 (four) times daily -  before meals and at bedtime. 07/21/21   Ralene Ok, MD  ondansetron (ZOFRAN) 8 MG tablet Take 1 tablet (8 mg total) by mouth 2 (two) times daily as needed (Nausea or vomiting). 03/21/21   [name], MD  polycarbophil (FIBERCON) 625 MG tablet Take 1,250 mg by mouth in the morning and at bedtime.    [provider]  prochlorperazine (COMPAZINE) 10 MG tablet Take 1 tablet (10 mg total) by mouth every 6 (six) hours as needed (Nausea or vomiting). 03/21/21   [name], MD  tetrahydrozoline-zinc (VISINE-AC) 0.05-0.25 % ophthalmic solution Place 2 drops into both eyes 4 (four) times daily as needed (allergy).    [provider]  traMADol (ULTRAM) 50 MG tablet Take 1-2 tablets (50-100 mg total) by mouth every 6 (six) hours as needed for moderate pain. 96/04/54   [name], MD  XARELTO 20 MG TABS tablet Take 20 mg by mouth daily with supper. 07/31/20   [provider]  XIGDUO XR 01-999 MG TB24 Take 1 tablet by mouth 2 (two) times daily with a meal. 06/13/20   [provider]    Allergies    Contrast media [iodinated diagnostic agents]  Review of Systems   Review of Systems  Constitutional:  Negative for fever.  HENT:  Negative for ear pain and sore throat.   Eyes:  Negative for pain.  Respiratory:  Negative for cough.   Cardiovascular:   Negative for chest pain.  Gastrointestinal:  Negative for abdominal pain.  Genitourinary:  Negative for flank pain.  Musculoskeletal:  Negative for back pain.  Skin:  Negative for color change and rash.  Neurological:  Negative for syncope.  All other systems reviewed and are negative.  Physical Exam Updated Vital Signs BP 139/81   Pulse (!) 107   Temp 98.7 F (37.1 C) (Oral)   Resp 16   Ht 5' 10"  (1.778 m)   Wt 91.2 kg   SpO2 99%   BMI  28.84 kg/m   Physical Exam Constitutional:      Appearance: He is well-developed.  HENT:     Head: Normocephalic.     Nose: Nose normal.  Eyes:     Extraocular Movements: Extraocular movements intact.  Cardiovascular:     Rate and Rhythm: Normal rate.  Pulmonary:     Effort: Pulmonary effort is normal.  Abdominal:     Tenderness: There is no abdominal tenderness. There is no guarding or rebound.  Skin:    Coloration: Skin is not jaundiced.  Neurological:     Mental Status: He is alert. Mental status is at baseline.    ED Results / Procedures / Treatments   Labs (all labs ordered are listed, but only abnormal results are displayed) Labs Reviewed - No data to display  EKG None  Radiology No results found.  Procedures Procedures   Medications Ordered in ED Medications - No data to display  ED Course  I have reviewed the triage vital signs and the nursing notes.  Pertinent labs & imaging results that were available during my care of the patient were reviewed by me and considered in my medical decision making (see chart for details).    MDM Rules/Calculators/A&P                           Patient mildly tachycardic, appears anxious, abdominal exam is otherwise benign no guarding no rebound no tenderness.  Ostomy site appears patent no cellulitis noted.  Mild skin irritation seen in superior portion of the ostomy.  Ostomy bag was removed and visualized.  There is perhaps invagination of the central lumen and a slight  misfit at the upper edge with a small amount of stool coming from what appears to be the upper edge of the ostomy.  Otherwise no clinical signs of perforation or peritonitis.  Case discussed with on-call surgery, recommending continued outpatient follow-up.  Patient states he has an appointment with his doctor tomorrow which advised him to keep.   Final Clinical Impression(s) / ED Diagnoses Final diagnoses:  Ileostomy care [facility])    Rx / DC Orders ED Discharge Orders     None        Luna Fuse, MD 08/07/21 [phone]

## 2021-08-07 NOTE — Discharge Instructions (Addendum)
Keep your appointment with surgery tomorrow.  Return to the ER if you have fevers worsening pain vomiting or any additional concerns.

## 2021-08-07 NOTE — Telephone Encounter (Signed)
Chart review.

## 2021-08-09 DIAGNOSIS — C20 Malignant neoplasm of rectum: Secondary | ICD-10-CM | POA: Diagnosis not present

## 2021-08-09 DIAGNOSIS — Z932 Ileostomy status: Secondary | ICD-10-CM | POA: Diagnosis not present

## 2021-08-15 ENCOUNTER — Telehealth (HOSPITAL_COMMUNITY): Payer: Self-pay | Admitting: Dietician

## 2021-08-15 NOTE — Telephone Encounter (Signed)
Patient identified on Malnutrition Screening Tool Report (MST)   Attempted to contact patient via telephone to introduce self and services available at [facility]. Left message with request for return call. Contact information provided.

## 2021-08-20 ENCOUNTER — Other Ambulatory Visit: Payer: Self-pay | Admitting: Licensed Clinical Social Worker

## 2021-08-20 DIAGNOSIS — C20 Malignant neoplasm of rectum: Secondary | ICD-10-CM

## 2021-08-20 NOTE — Progress Notes (Signed)
I spoke with Rolla Plate a pharmacist at [facility].  They need to know the patient's treatment cycle to fill the prescription.  Per Dr Ernestina Penna note from 07/30/2021 Mr Leppo will take capecitabine 2 weeks on 1 week off.  Rolla Plate stated that they would fill the prescription and ship to the patient.

## 2021-08-20 NOTE — Progress Notes (Signed)
Per Dr Lewayne Bunting' request. Pt has been scheduled for lab and f/u appt with Dr Burr Medico 09/06/2021.  Mr Faulkenberry is available at this date and time.  He sates that previously the pharmacy called him regarding the capecitabine prescription.  They stated they needed clarification from Dr Burr Medico.  Treatment was postponed so Mr Otterness did not tell us this.  I will call [facility].

## 2021-08-26 ENCOUNTER — Encounter: Payer: BC Managed Care – PPO | Admitting: Genetic Counselor

## 2021-08-26 ENCOUNTER — Other Ambulatory Visit: Payer: BC Managed Care – PPO

## 2021-09-03 ENCOUNTER — Ambulatory Visit: Payer: BC Managed Care – PPO | Admitting: Hematology

## 2021-09-03 ENCOUNTER — Other Ambulatory Visit: Payer: BC Managed Care – PPO

## 2021-09-05 DIAGNOSIS — Z932 Ileostomy status: Secondary | ICD-10-CM | POA: Diagnosis not present

## 2021-09-05 DIAGNOSIS — C20 Malignant neoplasm of rectum: Secondary | ICD-10-CM | POA: Diagnosis not present

## 2021-09-06 ENCOUNTER — Inpatient Hospital Stay: Payer: BC Managed Care – PPO | Attending: Hematology and Oncology

## 2021-09-06 ENCOUNTER — Inpatient Hospital Stay (HOSPITAL_BASED_OUTPATIENT_CLINIC_OR_DEPARTMENT_OTHER): Payer: BC Managed Care – PPO | Admitting: Hematology

## 2021-09-06 ENCOUNTER — Other Ambulatory Visit: Payer: Self-pay

## 2021-09-06 ENCOUNTER — Encounter: Payer: Self-pay | Admitting: Hematology

## 2021-09-06 VITALS — BP 154/93 | HR 101 | Temp 98.6°F | Resp 19 | Ht 70.0 in | Wt 199.5 lb

## 2021-09-06 DIAGNOSIS — Z7901 Long term (current) use of anticoagulants: Secondary | ICD-10-CM | POA: Insufficient documentation

## 2021-09-06 DIAGNOSIS — Z85048 Personal history of other malignant neoplasm of rectum, rectosigmoid junction, and anus: Secondary | ICD-10-CM | POA: Insufficient documentation

## 2021-09-06 DIAGNOSIS — K6289 Other specified diseases of anus and rectum: Secondary | ICD-10-CM | POA: Diagnosis not present

## 2021-09-06 DIAGNOSIS — Z79899 Other long term (current) drug therapy: Secondary | ICD-10-CM | POA: Insufficient documentation

## 2021-09-06 DIAGNOSIS — Z9221 Personal history of antineoplastic chemotherapy: Secondary | ICD-10-CM | POA: Diagnosis not present

## 2021-09-06 DIAGNOSIS — C20 Malignant neoplasm of rectum: Secondary | ICD-10-CM

## 2021-09-06 DIAGNOSIS — Z923 Personal history of irradiation: Secondary | ICD-10-CM | POA: Diagnosis not present

## 2021-09-06 DIAGNOSIS — Z86718 Personal history of other venous thrombosis and embolism: Secondary | ICD-10-CM | POA: Insufficient documentation

## 2021-09-06 LAB — COMPREHENSIVE METABOLIC PANEL
ALT: 15 U/L (ref 0–44)
AST: 11 U/L — ABNORMAL LOW (ref 15–41)
Albumin: 3.7 g/dL (ref 3.5–5.0)
Alkaline Phosphatase: 89 U/L (ref 38–126)
Anion gap: 11 (ref 5–15)
BUN: 30 mg/dL — ABNORMAL HIGH (ref 6–20)
CO2: 23 mmol/L (ref 22–32)
Calcium: 9.2 mg/dL (ref 8.9–10.3)
Chloride: 102 mmol/L (ref 98–111)
Creatinine, Ser: 1.26 mg/dL — ABNORMAL HIGH (ref 0.61–1.24)
GFR, Estimated: >60 mL/min (ref >60)
Glucose, Bld: 314 mg/dL — ABNORMAL HIGH (ref 70–99)
Potassium: 4.4 mmol/L (ref 3.5–5.1)
Sodium: 136 mmol/L (ref 135–145)
Total Bilirubin: 0.5 mg/dL (ref 0.3–1.2)
Total Protein: 7.1 g/dL (ref 6.5–8.1)

## 2021-09-06 LAB — CBC WITH DIFFERENTIAL/PLATELET
Abs Immature Granulocytes: 0.02 10*3/uL (ref 0.00–0.07)
Basophils Absolute: 0 10*3/uL (ref 0.0–0.1)
Basophils Relative: 1 %
Eosinophils Absolute: 0.1 10*3/uL (ref 0.0–0.5)
Eosinophils Relative: 2 %
HCT: 39.6 % (ref 39.0–52.0)
Hemoglobin: 12.9 g/dL — ABNORMAL LOW (ref 13.0–17.0)
Immature Granulocytes: 0 %
Lymphocytes Relative: 26 %
Lymphs Abs: 1.7 10*3/uL (ref 0.7–4.0)
MCH: 25.9 pg — ABNORMAL LOW (ref 26.0–34.0)
MCHC: 32.6 g/dL (ref 30.0–36.0)
MCV: 79.5 fL — ABNORMAL LOW (ref 80.0–100.0)
Monocytes Absolute: 0.4 10*3/uL (ref 0.1–1.0)
Monocytes Relative: 5 %
Neutro Abs: 4.3 10*3/uL (ref 1.7–7.7)
Neutrophils Relative %: 66 %
Platelets: 228 10*3/uL (ref 150–400)
RBC: 4.98 MIL/uL (ref 4.22–5.81)
RDW: 14.6 % (ref 11.5–15.5)
WBC: 6.4 10*3/uL (ref 4.0–10.5)
nRBC: 0 % (ref 0.0–0.2)

## 2021-09-06 LAB — CEA (IN HOUSE-CHCC): CEA (CHCC-In House): 1.13 ng/mL (ref 0.00–5.00)

## 2021-09-06 NOTE — Progress Notes (Signed)
[place]   [phone] [phone]   Clinic Follow up Note   Patient Care Team: Shirline Frees, MD as PCP - General (Family Medicine) [name], MD as Consulting Physician (Hematology and Oncology) Jonnie Finner, RN (Inactive) as Oncology Nurse Navigator  Date of Service:  09/06/2021  CHIEF COMPLAINT: f/u of rectal cancer  CURRENT THERAPY:  Adjuvant Xeloda  ASSESSMENT & PLAN:  Steven [name] is a 50 y.o. male with   1. Adenocarcinoma of rectum, cT3N0M0, ypT0N0, MSS -he underwent CT AP 11/23/20 to rule out malignancy after PE. This showed asymmetric soft tissue in rectum. -colonoscopy 03/05/21 with Dr. Rush Landmark revealed a palpable rectal mass on digital rectal exam, measuring 5 cm on scope. Pathology confirmed adenocarcinoma. -staging CT CAP 03/14/21 showed questionable tiny perirectal lymph nodes, but negative node on staging pelvic MRI 03/15/21. -he received neoadjuvant concurrent chemoradiation 04/03/21-05/10/21. -definitive resection on 07/17/21 under Dr. Marcello Moores. Pathology showed complete response with no residual carcinoma. Lymph nodes and margins negative. -he saw Dr. Marcello Moores on 08/19/21 for increasing postoperative rectal pain and was found to have some anastomotic dehiscence. Because of the slow healing and lack of improvement from antibiotics, he has not yet started Xeloda. He is scheduled for f/u with Dr. Marcello Moores on 12/12. -given he is almost two months out from surgery, I discussed the need to start Xeloda soon. He notes he already obtained the medication, will start next Monday 12/12. Will reduce his dose to 1557m q12h for 14 days for first cycle    2. Bilateral pulmonary embolism -first episode in 03/2019 and again in 06/2020 -on Xarelto since 07/2020, plan to stop after he completes adjuvant chemo    3. Genetics -given his young age, he qualifies for genetic testing. I will refer him.     PLAN: -begin first cycle Xeloda, 1500  mg q12hr 2 weeks on/1 week off on Monday 12/12  -lab and f/u with Dr. IChryl Heck or me, on 12/29   No problem-specific Assessment & Plan notes found for this encounter.   SUMMARY OF ONCOLOGIC HISTORY: Oncology History  [place]  03/05/2021 Cancer Staging   Staging form: Colon and Rectum, AJCC 8th Edition - Clinical stage from 03/05/2021: Stage IIA (cT3, cN0, cM0) - Signed by FTruitt Merle MD on 07/29/2021 Stage prefix: Initial diagnosis Total positive nodes: 0    03/21/2021 Initial Diagnosis   [place]denocarcinoma of [place]um ([facility]   03/28/2021 -  Chemotherapy    Patient is on Treatment Plan: COLORECTAL CAPECITABINE + XRT       07/17/2021 Cancer Staging   Staging form: Colon and Rectum, AJCC 8th Edition - Pathologic stage from 07/17/2021: ypT0, pN0, cM0 - Signed by FTruitt Merle MD on 07/29/2021 Stage prefix: Post-therapy Total positive nodes: 0 Residual tumor (R): R0 - None       INTERVAL HISTORY:  Steven [name] here for a follow up of rectal cancer. He was last seen by me on 07/30/21. He presents to the clinic alone. He reports he has been having a lot of rectal pain since his surgery, up to 8-9/10. He was prescribed tramadol but only for 5 days and has been taking tylenol since.   All other systems were reviewed with the patient and are negative.  MEDICAL HISTORY:  Past Medical History:  Diagnosis Date   Anemia 02/2021   iron   Anxiety    Asthma    Cancer ([facility] 02/2021   Depression    Diabetes mellitus without complication (  [place])    History of kidney stones    HLD (hyperlipidemia)    Hypertension    Kidney stones    Neuromuscular disorder ([place])    from radiation bi lat   OSA (obstructive sleep apnea)    CPAP   Peripheral vascular disease ([place])    DVT    SURGICAL HISTORY: Past Surgical History:  Procedure Laterality Date   APPENDECTOMY  1989   ag   COLONOSCOPY  03/05/2021   CYST EXCISION  2011   left neck   DIVERTING ILEOSTOMY N/A 07/17/2021    Procedure: DIVERTING ILEOSTOMY;  Surgeon: [name], MD;  Location: [facility];  Service: General;  Laterality: N/A;   WISDOM TOOTH EXTRACTION     XI ROBOTIC ASSISTED LOWER ANTERIOR RESECTION N/A 07/17/2021   Procedure: XI ROBOTIC ASSISTED LOWER ANTERIOR RESECTION;  Surgeon: [name], MD;  Location: [facility];  Service: General;  Laterality: N/A;    I have reviewed the social history and family history with the patient and they are unchanged from previous note.  ALLERGIES:  is allergic to contrast media [iodinated diagnostic agents].  MEDICATIONS:  Current Outpatient Medications  Medication Sig Dispense Refill   acetaminophen (TYLENOL) 500 MG tablet Take 1,000 mg by mouth every 6 (six) hours as needed for mild pain.     albuterol (VENTOLIN HFA) 108 (90 Base) MCG/ACT inhaler Inhale 2 puffs into the lungs every 4 (four) hours as needed for wheezing.     ALPRAZolam (XANAX) 0.25 MG tablet Take 0.25 mg by mouth daily as needed for anxiety or sleep.     blood glucose meter kit and supplies KIT Dispense based on patient and insurance preference. Use up to four times daily as directed. (FOR ICD-9 250.00, 250.01). 1 each 0   capecitabine (XELODA) 500 MG tablet Take 4 tablets (2,000 mg total) by mouth 2 (two) times daily after a meal. 112 tablet 1   Cinnamon 500 MG capsule Take 500 mg by mouth daily with supper.     fenofibrate 160 MG tablet Take 160 mg by mouth daily.     irbesartan (AVAPRO) 150 MG tablet Take 150 mg by mouth daily.     loperamide (IMODIUM) 2 MG capsule Take 2 capsules (4 mg total) by mouth 4 (four) times daily -  before meals and at bedtime. 30 capsule 0   ondansetron (ZOFRAN) 8 MG tablet Take 1 tablet (8 mg total) by mouth 2 (two) times daily as needed (Nausea or vomiting). 30 tablet 1   polycarbophil (FIBERCON) 625 MG tablet Take 1,250 mg by mouth in the morning and at bedtime.     prochlorperazine (COMPAZINE) 10 MG tablet Take 1 tablet (10 mg total) by mouth every 6 (six)  hours as needed (Nausea or vomiting). 30 tablet 1   tetrahydrozoline-zinc (VISINE-AC) 0.05-0.25 % ophthalmic solution Place 2 drops into both eyes 4 (four) times daily as needed (allergy).     traMADol (ULTRAM) 50 MG tablet Take 1-2 tablets (50-100 mg total) by mouth every 6 (six) hours as needed for moderate pain. 30 tablet 0   XARELTO 20 MG TABS tablet Take 20 mg by mouth daily with supper.     XIGDUO XR 01-999 MG TB24 Take 1 tablet by mouth 2 (two) times daily with a meal.     No current facility-administered medications for this visit.    PHYSICAL EXAMINATION: ECOG PERFORMANCE STATUS: 1 - Symptomatic but completely ambulatory  Vitals:   09/06/21 1005  BP: (!) 154/93  Pulse: Marland Kitchen)  101  Resp: 19  Temp: 98.6 F (37 C)  SpO2: 100%   Wt Readings from Last 3 Encounters:  09/06/21 199 lb 8 oz (90.5 kg)  08/07/21 201 lb (91.2 kg)  07/30/21 205 lb 11.2 oz (93.3 kg)     GENERAL:alert, no distress and comfortable SKIN: skin color normal, no rashes or significant lesions EYES: normal, Conjunctiva are pink and non-injected, sclera clear  NEURO: alert & oriented x 3 with fluent speech  LABORATORY DATA:  I have reviewed the data as listed CBC Latest Ref Rng & Units 09/06/2021 07/19/2021 07/18/2021  WBC 4.0 - 10.5 K/uL 6.4 5.3 5.4  Hemoglobin 13.0 - 17.0 g/dL 12.9(L) 13.1 12.7(L)  Hematocrit 39.0 - 52.0 % 39.6 39.4 37.3(L)  Platelets 150 - 400 K/uL 228 149(L) 160     CMP Latest Ref Rng & Units 09/06/2021 07/20/2021 07/19/2021  Glucose 70 - 99 mg/dL 314(H) 165(H) 112(H)  BUN 6 - 20 mg/dL 30(H) 15 13  Creatinine 0.61 - 1.24 mg/dL 1.26(H) 0.71 1.04  Sodium 135 - 145 mmol/L 136 134(L) 135  Potassium 3.5 - 5.1 mmol/L 4.4 4.0 4.1  Chloride 98 - 111 mmol/L 102 100 102  CO2 22 - 32 mmol/L _0 Calcium 8.9 - 10.3 mg/dL 9.2 9.1 8.3(L)  Total Protein 6.5 - 8.1 g/dL 7.1 - -  Total Bilirubin 0.3 - 1.2 mg/dL 0.5 - -  Alkaline Phos 38 - 126 U/L 89 - -  AST 15 - 41 U/L 11(L) - -  ALT 0 -  44 U/L 15 - -      RADIOGRAPHIC STUDIES: I have personally reviewed the radiological images as listed and agreed with the findings in the report. No results found.    No orders of the defined types were placed in this encounter.  All questions were answered. The patient knows to call the clinic with any problems, questions or concerns. No barriers to learning was detected. The total time spent in the appointment was 30 minutes.     Truitt Merle, MD 09/06/2021   I, Wilburn Mylar, am acting as scribe for Truitt Merle, MD.   I have reviewed the above documentation for accuracy and completeness, and I agree with the above.

## 2021-09-10 DIAGNOSIS — Z932 Ileostomy status: Secondary | ICD-10-CM | POA: Diagnosis not present

## 2021-09-12 DIAGNOSIS — Z932 Ileostomy status: Secondary | ICD-10-CM | POA: Diagnosis not present

## 2021-09-27 ENCOUNTER — Other Ambulatory Visit: Payer: BC Managed Care – PPO

## 2021-09-27 ENCOUNTER — Encounter: Payer: Self-pay | Admitting: Hematology and Oncology

## 2021-09-27 ENCOUNTER — Ambulatory Visit: Payer: BC Managed Care – PPO | Admitting: Hematology and Oncology

## 2021-10-04 ENCOUNTER — Encounter: Payer: Self-pay | Admitting: Hematology and Oncology

## 2021-10-04 ENCOUNTER — Inpatient Hospital Stay: Payer: BC Managed Care – PPO | Attending: Hematology and Oncology

## 2021-10-04 ENCOUNTER — Inpatient Hospital Stay (HOSPITAL_BASED_OUTPATIENT_CLINIC_OR_DEPARTMENT_OTHER): Payer: BC Managed Care – PPO | Admitting: Hematology and Oncology

## 2021-10-04 ENCOUNTER — Other Ambulatory Visit: Payer: Self-pay

## 2021-10-04 VITALS — BP 135/71 | HR 97 | Temp 97.7°F | Resp 18 | Wt 199.4 lb

## 2021-10-04 DIAGNOSIS — Z86718 Personal history of other venous thrombosis and embolism: Secondary | ICD-10-CM | POA: Diagnosis not present

## 2021-10-04 DIAGNOSIS — Z86711 Personal history of pulmonary embolism: Secondary | ICD-10-CM | POA: Diagnosis not present

## 2021-10-04 DIAGNOSIS — T451X5A Adverse effect of antineoplastic and immunosuppressive drugs, initial encounter: Secondary | ICD-10-CM | POA: Insufficient documentation

## 2021-10-04 DIAGNOSIS — Z79899 Other long term (current) drug therapy: Secondary | ICD-10-CM | POA: Diagnosis not present

## 2021-10-04 DIAGNOSIS — Z7901 Long term (current) use of anticoagulants: Secondary | ICD-10-CM | POA: Diagnosis not present

## 2021-10-04 DIAGNOSIS — D649 Anemia, unspecified: Secondary | ICD-10-CM | POA: Insufficient documentation

## 2021-10-04 DIAGNOSIS — I2699 Other pulmonary embolism without acute cor pulmonale: Secondary | ICD-10-CM | POA: Diagnosis not present

## 2021-10-04 DIAGNOSIS — C20 Malignant neoplasm of rectum: Secondary | ICD-10-CM | POA: Diagnosis not present

## 2021-10-04 DIAGNOSIS — Z85048 Personal history of other malignant neoplasm of rectum, rectosigmoid junction, and anus: Secondary | ICD-10-CM | POA: Diagnosis not present

## 2021-10-04 DIAGNOSIS — R110 Nausea: Secondary | ICD-10-CM | POA: Insufficient documentation

## 2021-10-04 LAB — COMPREHENSIVE METABOLIC PANEL
ALT: 19 U/L (ref 0–44)
AST: 15 U/L (ref 15–41)
Albumin: 4.3 g/dL (ref 3.5–5.0)
Alkaline Phosphatase: 76 U/L (ref 38–126)
Anion gap: 8 (ref 5–15)
BUN: 31 mg/dL — ABNORMAL HIGH (ref 6–20)
CO2: 23 mmol/L (ref 22–32)
Calcium: 9.8 mg/dL (ref 8.9–10.3)
Chloride: 104 mmol/L (ref 98–111)
Creatinine, Ser: 0.87 mg/dL (ref 0.61–1.24)
GFR, Estimated: >60 mL/min (ref >60)
Glucose, Bld: 203 mg/dL — ABNORMAL HIGH (ref 70–99)
Potassium: 4.8 mmol/L (ref 3.5–5.1)
Sodium: 135 mmol/L (ref 135–145)
Total Bilirubin: 0.8 mg/dL (ref 0.3–1.2)
Total Protein: 7.4 g/dL (ref 6.5–8.1)

## 2021-10-04 LAB — CBC WITH DIFFERENTIAL/PLATELET
Abs Immature Granulocytes: 0.01 10*3/uL (ref 0.00–0.07)
Basophils Absolute: 0 10*3/uL (ref 0.0–0.1)
Basophils Relative: 0 %
Eosinophils Absolute: 0.1 10*3/uL (ref 0.0–0.5)
Eosinophils Relative: 2 %
HCT: 39.9 % (ref 39.0–52.0)
Hemoglobin: 12.9 g/dL — ABNORMAL LOW (ref 13.0–17.0)
Immature Granulocytes: 0 %
Lymphocytes Relative: 26 %
Lymphs Abs: 1.7 10*3/uL (ref 0.7–4.0)
MCH: 26.1 pg (ref 26.0–34.0)
MCHC: 32.3 g/dL (ref 30.0–36.0)
MCV: 80.6 fL (ref 80.0–100.0)
Monocytes Absolute: 0.4 10*3/uL (ref 0.1–1.0)
Monocytes Relative: 6 %
Neutro Abs: 4.4 10*3/uL (ref 1.7–7.7)
Neutrophils Relative %: 66 %
Platelets: 188 10*3/uL (ref 150–400)
RBC: 4.95 MIL/uL (ref 4.22–5.81)
RDW: 18.6 % — ABNORMAL HIGH (ref 11.5–15.5)
WBC: 6.6 10*3/uL (ref 4.0–10.5)
nRBC: 0 % (ref 0.0–0.2)

## 2021-10-04 LAB — CEA (IN HOUSE-CHCC): CEA (CHCC-In House): 2.35 ng/mL (ref 0.00–5.00)

## 2021-10-04 NOTE — Progress Notes (Signed)
[place]   Telephone:(336) [phone] Fax:(336) [phone]   Clinic Follow up Note   Patient Care Team: Shirline Frees, MD as PCP - General (Family Medicine) [name], MD as Consulting Physician (Hematology and Oncology) Jonnie Finner, RN (Inactive) as Oncology Nurse Navigator  Date of Service:  10/04/2021  CHIEF COMPLAINT: f/u of rectal cancer  CURRENT THERAPY:  Adjuvant Xeloda  ASSESSMENT & PLAN:  Steven [name] is a 51 y.o. male with   1. Adenocarcinoma of rectum, cT3N0M0, ypT0N0, MSS -he underwent CT AP 11/23/20 to rule out malignancy after PE. This showed asymmetric soft tissue in rectum. -colonoscopy 03/05/21 with Dr. Rush Landmark revealed a palpable rectal mass on digital rectal exam, measuring 5 cm on scope. Pathology confirmed adenocarcinoma. -staging CT CAP 03/14/21 showed questionable tiny perirectal lymph nodes, but negative node on staging pelvic MRI 03/15/21. -he received neoadjuvant concurrent chemoradiation 04/03/21-05/10/21. -definitive resection on 07/17/21 under Dr. Marcello Moores. Pathology showed complete response with no residual carcinoma. Lymph nodes and margins negative. -he saw Dr. Marcello Moores on 08/19/21 for increasing postoperative rectal pain and was found to have some anastomotic dehiscence. Because of the slow healing and lack of improvement from antibiotics, there was delay starting xeloda. -given he is almost two months out from surgery, I discussed the need to start Xeloda soon. He notes he already obtained the medication, will start next Monday 12/12. Will reduce his dose to 1552m q12h for 14 days for first cycle    2. Bilateral pulmonary embolism -first episode in 03/2019 and again in 06/2020 -on Xarelto since 07/2020, plan to stop after he completes adjuvant chemo    3. Genetics -given his young age, he qualifies for genetic testing. I will refer him.     PLAN: -begin first cycle Xeloda, 1500 mg q12hr 2 weeks on/1 week off on Monday 12/12   -lab and f/u with Dr. IChryl Heck or me, on 12/29   No problem-specific Assessment & Plan notes found for this encounter.    SUMMARY OF ONCOLOGIC HISTORY: Oncology History  [place] ([facility]  03/05/2021 Cancer Staging   Staging form: Colon and Rectum, AJCC 8th Edition - Clinical stage from 03/05/2021: Stage IIA (cT3, cN0, cM0) - Signed by FTruitt Merle MD on 07/29/2021 Stage prefix: Initial diagnosis Total positive nodes: 0    03/21/2021 Initial Diagnosis   [place]   03/28/2021 -  Chemotherapy    Patient is on Treatment Plan: COLORECTAL CAPECITABINE + XRT       07/17/2021 Cancer Staging   Staging form: Colon and Rectum, AJCC 8th Edition - Pathologic stage from 07/17/2021: ypT0, pN0, cM0 - Signed by FTruitt Merle MD on 07/29/2021 Stage prefix: Post-therapy Total positive nodes: 0 Residual tumor (R): R0 - None       INTERVAL HISTORY:  Steven Bredesonis here for a follow up of rectal cancer. He was last seen by me on 07/30/21. He presents to the clinic alone. He reports he has been having a lot of rectal pain since his surgery, up to 8-9/10. He was prescribed tramadol but only for 5 days and has been taking tylenol since.   All other systems were reviewed with the patient and are negative.  MEDICAL HISTORY:  Past Medical History:  Diagnosis Date   Anemia 02/2021   iron   Anxiety    Asthma    Cancer ([facility] [facility] 02/2021   Depression    [place]    History of kidney stones  HLD (hyperlipidemia)    Hypertension    Kidney stones    Neuromuscular disorder ([facility])    from radiation bi lat   OSA (obstructive sleep apnea)    CPAP   Peripheral vascular disease ([place])    DVT    SURGICAL HISTORY: Past Surgical History:  Procedure Laterality Date   APPENDECTOMY  1989   ag   COLONOSCOPY  03/05/2021   CYST EXCISION  2011   left neck   DIVERTING ILEOSTOMY N/A 07/17/2021   Procedure: DIVERTING ILEOSTOMY;  Surgeon:  [name], MD;  Location: [facility];  Service: General;  Laterality: N/A;   WISDOM TOOTH EXTRACTION     XI ROBOTIC ASSISTED LOWER ANTERIOR RESECTION N/A 07/17/2021   Procedure: XI ROBOTIC ASSISTED LOWER ANTERIOR RESECTION;  Surgeon: [name], MD;  Location: [facility];  Service: General;  Laterality: N/A;    I have reviewed the social history and family history with the patient and they are unchanged from previous note.  ALLERGIES:  is allergic to contrast media [iodinated contrast media].  MEDICATIONS:  Current Outpatient Medications  Medication Sig Dispense Refill   acetaminophen (TYLENOL) 500 MG tablet Take 1,000 mg by mouth every 6 (six) hours as needed for mild pain.     albuterol (VENTOLIN HFA) 108 (90 Base) MCG/ACT inhaler Inhale 2 puffs into the lungs every 4 (four) hours as needed for wheezing.     ALPRAZolam (XANAX) 0.25 MG tablet Take 0.25 mg by mouth daily as needed for anxiety or sleep.     blood glucose meter kit and supplies KIT Dispense based on patient and insurance preference. Use up to four times daily as directed. (FOR ICD-9 250.00, 250.01). 1 each 0   capecitabine (XELODA) 500 MG tablet Take 4 tablets (2,000 mg total) by mouth 2 (two) times daily after a meal. 112 tablet 1   Cinnamon 500 MG capsule Take 500 mg by mouth daily with supper.     fenofibrate 160 MG tablet Take 160 mg by mouth daily.     irbesartan (AVAPRO) 150 MG tablet Take 150 mg by mouth daily.     loperamide (IMODIUM) 2 MG capsule Take 2 capsules (4 mg total) by mouth 4 (four) times daily -  before meals and at bedtime. 30 capsule 0   ondansetron (ZOFRAN) 8 MG tablet Take 1 tablet (8 mg total) by mouth 2 (two) times daily as needed (Nausea or vomiting). 30 tablet 1   polycarbophil (FIBERCON) 625 MG tablet Take 1,250 mg by mouth in the morning and at bedtime.     prochlorperazine (COMPAZINE) 10 MG tablet Take 1 tablet (10 mg total) by mouth every 6 (six) hours as needed (Nausea or vomiting). 30 tablet 1    tetrahydrozoline-zinc (VISINE-AC) 0.05-0.25 % ophthalmic solution Place 2 drops into both eyes 4 (four) times daily as needed (allergy).     traMADol (ULTRAM) 50 MG tablet Take 1-2 tablets (50-100 mg total) by mouth every 6 (six) hours as needed for moderate pain. 30 tablet 0   XARELTO 20 MG TABS tablet Take 20 mg by mouth daily with supper.     XIGDUO XR 01-999 MG TB24 Take 1 tablet by mouth 2 (two) times daily with a meal.     No current facility-administered medications for this visit.    PHYSICAL EXAMINATION: ECOG PERFORMANCE STATUS: 1 - Symptomatic but completely ambulatory  Vitals:   10/04/21 1148  BP: 135/71  Pulse: 97  Resp: 18  Temp: 97.7 F (36.5 C)  SpO2:  100%   Wt Readings from Last 3 Encounters:  10/04/21 199 lb 6.4 oz (90.4 kg)  09/06/21 199 lb 8 oz (90.5 kg)  08/07/21 201 lb (91.2 kg)     GENERAL:alert, no distress and comfortable SKIN: skin color normal, no rashes or significant lesions EYES: normal, Conjunctiva are pink and non-injected, sclera clear  NEURO: alert & oriented x 3 with fluent speech  LABORATORY DATA:  I have reviewed the data as listed CBC Latest Ref Rng & Units 10/04/2021 09/06/2021 07/19/2021  WBC 4.0 - 10.5 K/uL 6.6 6.4 5.3  Hemoglobin 13.0 - 17.0 g/dL 12.9(L) 12.9(L) 13.1  Hematocrit 39.0 - 52.0 % 39.9 39.6 39.4  Platelets 150 - 400 K/uL 188 228 149(L)     CMP Latest Ref Rng & Units 09/06/2021 07/20/2021 07/19/2021  Glucose 70 - 99 mg/dL 314(H) 165(H) 112(H)  BUN 6 - 20 mg/dL 30(H) 15 13  Creatinine 0.61 - 1.24 mg/dL 1.26(H) 0.71 1.04  Sodium 135 - 145 mmol/L 136 134(L) 135  Potassium 3.5 - 5.1 mmol/L 4.4 4.0 4.1  Chloride 98 - 111 mmol/L 102 100 102  CO2 22 - 32 mmol/L _0 Calcium 8.9 - 10.3 mg/dL 9.2 9.1 8.3(L)  Total Protein 6.5 - 8.1 g/dL 7.1 - -  Total Bilirubin 0.3 - 1.2 mg/dL 0.5 - -  Alkaline Phos 38 - 126 U/L 89 - -  AST 15 - 41 U/L 11(L) - -  ALT 0 - 44 U/L 15 - -      RADIOGRAPHIC STUDIES: I have  personally reviewed the radiological images as listed and agreed with the findings in the report. No results found.    No orders of the defined types were placed in this encounter.   All questions were answered. The patient knows to call the clinic with any problems, questions or concerns. No barriers to learning was detected. The total time spent in the appointment was 30 minutes.     [name], MD 10/04/2021   I, Wilburn Mylar, am acting as scribe for Truitt Merle, MD.   I have reviewed the above documentation for accuracy and completeness, and I agree with the above.

## 2021-10-04 NOTE — Progress Notes (Signed)
[place]   [phone]) [phone] Fax:(336) [phone]   Clinic Follow up Note   Patient Care Team: Shirline Frees, MD as PCP - General (Family Medicine) [name], MD as Consulting Physician (Hematology and Oncology) Jonnie Finner, RN (Inactive) as Oncology Nurse Navigator  Date of Service:  10/04/2021  CHIEF COMPLAINT: f/u of rectal cancer  CURRENT THERAPY:  Adjuvant Xeloda  ASSESSMENT & PLAN:  Steven [name] is a 51 y.o. male with   1. Adenocarcinoma of rectum, cT3N0M0, ypT0N0, MSS -he underwent CT AP 11/23/20 to rule out malignancy after PE. This showed asymmetric soft tissue in rectum. -colonoscopy 03/05/21 with Dr. Rush Landmark revealed a palpable rectal mass on digital rectal exam, measuring 5 cm on scope. Pathology confirmed adenocarcinoma. -staging CT CAP 03/14/21 showed questionable tiny perirectal lymph nodes, but negative node on staging pelvic MRI 03/15/21. -he received neoadjuvant concurrent chemoradiation 04/03/21-05/10/21. -definitive resection on 07/17/21 under Dr. Marcello Moores. Pathology showed complete response with no residual carcinoma. Lymph nodes and margins negative. -he saw Dr. Marcello Moores on 08/19/21 for increasing postoperative rectal pain and was found to have some anastomotic dehiscence. Because of the slow healing and lack of improvement from antibiotics, he has not yet started Xeloda. He is scheduled for f/u with Dr. Marcello Moores on 12/12. -He started xeloda Monday 12/12 C1D1 C2D1 09/30/2021. ROS today pertinent for some gum pain which is very random and mild. No nausea, vomiting, mouth sores, diarrhea, HFS. PE unremarkable. Ostomy bag not examined, he is wearing an opaque bag. According to him stoma is healthy I have reviewed his labs, No concerns. He will continue Xeloda at the current dose, discussed that calculated dose is higher but he is worried about the adverse effects and would like to continue current dose since he is tolerating it well RTC in 3  weeks.   2. Bilateral pulmonary embolism -first episode in 03/2019 and again in 06/2020 -on Xarelto since 07/2020, plan to stop after he completes adjuvant chemo  No new concerns for DVT/PE   3. Genetics He couldn't make it to the genetics appointment, Discussed that he should re schedule.  4. Chemotherapy induced nausea. He will use PRN zofran.     PLAN: -continue current dose of Xeloda 1500 mg BID 2 weeks on and one week off -lab and f/u before C3D1   No problem-specific Assessment & Plan notes found for this encounter.  SUMMARY OF ONCOLOGIC HISTORY: Oncology History  Adenocarcinoma of rectum ([place])  03/05/2021 Cancer Staging   Staging form: Colon and Rectum, AJCC 8th Edition - Clinical stage from 03/05/2021: Stage IIA (cT3, cN0, cM0) - Signed by Truitt Merle, MD on 07/29/2021 Stage prefix: Initial diagnosis Total positive nodes: 0    03/21/2021 Initial Diagnosis   Adenocarcinoma of rectum ([place])   03/28/2021 -  Chemotherapy    Patient is on Treatment Plan: COLORECTAL CAPECITABINE + XRT       07/17/2021 Cancer Staging   Staging form: Colon and Rectum, AJCC 8th Edition - Pathologic stage from 07/17/2021: ypT0, pN0, cM0 - Signed by Truitt Merle, MD on 07/29/2021 Stage prefix: Post-therapy Total positive nodes: 0 Residual tumor (R): R0 - None       INTERVAL HISTORY:  Steven [name] is here for a follow up of rectal cancer. He presents to the clinic alone. His rectal pain has resolved. Ostomy looks healthy and is draining well. Stools not too watery. No nausea, vomiting, mouth sores. He has noticed some gum pain but this is very random and not too  bothersome. He denies any change in breathing, urinary habits. No new neurological complaints. No HFS   All other systems were reviewed with the patient and are negative.  MEDICAL HISTORY:  Past Medical History:  Diagnosis Date   Anemia 02/2021   iron   Anxiety    Asthma    Cancer ([place]) 02/2021   Depression    Diabetes  mellitus without complication ([place])    History of kidney stones    HLD (hyperlipidemia)    Hypertension    Kidney stones    Neuromuscular disorder ([facility])    from radiation bi lat   OSA (obstructive sleep apnea)    CPAP   Peripheral vascular disease ([place])    DVT    SURGICAL HISTORY: Past Surgical History:  Procedure Laterality Date   APPENDECTOMY  1989   ag   COLONOSCOPY  03/05/2021   CYST EXCISION  2011   left neck   DIVERTING ILEOSTOMY N/A 07/17/2021   Procedure: DIVERTING ILEOSTOMY;  Surgeon: [name], MD;  Location: [facility];  Service: General;  Laterality: N/A;   WISDOM TOOTH EXTRACTION     XI ROBOTIC ASSISTED LOWER ANTERIOR RESECTION N/A 07/17/2021   Procedure: XI ROBOTIC ASSISTED LOWER ANTERIOR RESECTION;  Surgeon: [name], MD;  Location: [facility];  Service: General;  Laterality: N/A;    I have reviewed the social history and family history with the patient and they are unchanged from previous note.  ALLERGIES:  is allergic to contrast media [iodinated contrast media].  MEDICATIONS:  Current Outpatient Medications  Medication Sig Dispense Refill   acetaminophen (TYLENOL) 500 MG tablet Take 1,000 mg by mouth every 6 (six) hours as needed for mild pain.     albuterol (VENTOLIN HFA) 108 (90 Base) MCG/ACT inhaler Inhale 2 puffs into the lungs every 4 (four) hours as needed for wheezing.     ALPRAZolam (XANAX) 0.25 MG tablet Take 0.25 mg by mouth daily as needed for anxiety or sleep.     blood glucose meter kit and supplies KIT Dispense based on patient and insurance preference. Use up to four times daily as directed. (FOR ICD-9 250.00, 250.01). 1 each 0   capecitabine (XELODA) 500 MG tablet Take 4 tablets (2,000 mg total) by mouth 2 (two) times daily after a meal. 112 tablet 1   Cinnamon 500 MG capsule Take 500 mg by mouth daily with supper.     fenofibrate 160 MG tablet Take 160 mg by mouth daily.     loperamide (IMODIUM) 2 MG capsule Take 2 capsules (4 mg total)  by mouth 4 (four) times daily -  before meals and at bedtime. 30 capsule 0   ondansetron (ZOFRAN) 8 MG tablet Take 1 tablet (8 mg total) by mouth 2 (two) times daily as needed (Nausea or vomiting). 30 tablet 1   polycarbophil (FIBERCON) 625 MG tablet Take 1,250 mg by mouth in the morning and at bedtime.     prochlorperazine (COMPAZINE) 10 MG tablet Take 1 tablet (10 mg total) by mouth every 6 (six) hours as needed (Nausea or vomiting). 30 tablet 1   tetrahydrozoline-zinc (VISINE-AC) 0.05-0.25 % ophthalmic solution Place 2 drops into both eyes 4 (four) times daily as needed (allergy).     traMADol (ULTRAM) 50 MG tablet Take 1-2 tablets (50-100 mg total) by mouth every 6 (six) hours as needed for moderate pain. 30 tablet 0   XARELTO 20 MG TABS tablet Take 20 mg by mouth daily with supper.     XIGDUO  XR 01-999 MG TB24 Take 1 tablet by mouth 2 (two) times daily with a meal.     No current facility-administered medications for this visit.    PHYSICAL EXAMINATION: ECOG PERFORMANCE STATUS: 1 - Symptomatic but completely ambulatory  Vitals:   10/04/21 1148  BP: 135/71  Pulse: 97  Resp: 18  Temp: 97.7 F (36.5 C)  SpO2: 100%   Wt Readings from Last 3 Encounters:  10/04/21 199 lb 6.4 oz (90.4 kg)  09/06/21 199 lb 8 oz (90.5 kg)  08/07/21 201 lb (91.2 kg)     GENERAL:alert, no distress and comfortable SKIN: skin color normal, no rashes or significant lesions EYES: normal, Conjunctiva are pink and non-injected, sclera clear  Mouth: NO mouth sores NEURO: alert & oriented x 3 with fluent speech Abdomen: benign Chest: CTA bilaterally  LABORATORY DATA:  I have reviewed the data as listed CBC Latest Ref Rng & Units 10/04/2021 09/06/2021 07/19/2021  WBC 4.0 - 10.5 K/uL 6.6 6.4 5.3  Hemoglobin 13.0 - 17.0 g/dL 12.9(L) 12.9(L) 13.1  Hematocrit 39.0 - 52.0 % 39.9 39.6 39.4  Platelets 150 - 400 K/uL 188 228 149(L)     CMP Latest Ref Rng & Units 10/04/2021 09/06/2021 07/20/2021  Glucose 70 - 99  mg/dL 203(H) 314(H) 165(H)  BUN 6 - 20 mg/dL 31(H) 30(H) 15  Creatinine 0.61 - 1.24 mg/dL 0.87 1.26(H) 0.71  Sodium 135 - 145 mmol/L 135 136 134(L)  Potassium 3.5 - 5.1 mmol/L 4.8 4.4 4.0  Chloride 98 - 111 mmol/L 104 102 100  CO2 22 - 32 mmol/L 23 23 23   Calcium 8.9 - 10.3 mg/dL 9.8 9.2 9.1  Total Protein 6.5 - 8.1 g/dL 7.4 7.1 -  Total Bilirubin 0.3 - 1.2 mg/dL 0.8 0.5 -  Alkaline Phos 38 - 126 U/L 76 89 -  AST 15 - 41 U/L 15 11(L) -  ALT 0 - 44 U/L 19 15 -      RADIOGRAPHIC STUDIES: I have personally reviewed the radiological images as listed and agreed with the findings in the report.  No results found.    No orders of the defined types were placed in this encounter.   All questions were answered. The patient knows to call the clinic with any problems, questions or concerns. No barriers to learning was detected. The total time spent in the appointment was 30 minutes.     [name], MD 10/04/2021   I, Wilburn Mylar, am acting as scribe for Truitt Merle, MD.   I have reviewed the above documentation for accuracy and completeness, and I agree with the above.

## 2021-10-04 NOTE — Progress Notes (Signed)
[place]   Telephone:(336) [phone] Fax:(336) [phone]   Clinic Follow up Note   Patient Care Team: Shirline Frees, MD as PCP - General (Family Medicine) [name], MD as Consulting Physician (Hematology and Oncology) Jonnie Finner, RN (Inactive) as Oncology Nurse Navigator  Date of Service:  10/04/2021  CHIEF COMPLAINT: f/u of rectal cancer  CURRENT THERAPY:  Adjuvant Xeloda  ASSESSMENT & PLAN:  Steven [name] is a 51 y.o. male with   1. Adenocarcinoma of rectum, cT3N0M0, ypT0N0, MSS -he underwent CT AP 11/23/20 to rule out malignancy after PE. This showed asymmetric soft tissue in rectum. -colonoscopy 03/05/21 with Dr. Rush Landmark revealed a palpable rectal mass on digital rectal exam, measuring 5 cm on scope. Pathology confirmed adenocarcinoma. -staging CT CAP 03/14/21 showed questionable tiny perirectal lymph nodes, but negative node on staging pelvic MRI 03/15/21. -he received neoadjuvant concurrent chemoradiation 04/03/21-05/10/21. -definitive resection on 07/17/21 under Dr. Marcello Moores. Pathology showed complete response with no residual carcinoma. Lymph nodes and margins negative. -he saw Dr. Marcello Moores on 08/19/21 for increasing postoperative rectal pain and was found to have some anastomotic dehiscence. Because of the slow healing and lack of improvement from antibiotics, he has not yet started Xeloda. He is scheduled for f/u with Dr. Marcello Moores on 12/12. -given he is almost two months out from surgery, I discussed the need to start Xeloda soon. He notes he already obtained the medication, will start next Monday 12/12. Will reduce his dose to 1535m q12h for 14 days for first cycle    2. Bilateral pulmonary embolism -first episode in 03/2019 and again in 06/2020 -on Xarelto since 07/2020, plan to stop after he completes adjuvant chemo    3. Genetics -given his young age, he qualifies for genetic testing. I will refer him.     PLAN: -begin first cycle Xeloda, 1500  mg q12hr 2 weeks on/1 week off on Monday 12/12  -lab and f/u with Dr. IChryl Heck or me, on 12/29   No problem-specific Assessment & Plan notes found for this encounter.    SUMMARY OF ONCOLOGIC HISTORY: Oncology History  Aden[place] rectum ([place]  03/05/2021 Cancer Staging   Staging form: Colon and Rectum, AJCC 8th Edition - Clinical stage from 03/05/2021: Stage IIA (cT3, cN0, cM0) - Signed by FTruitt Merle MD on 07/29/2021 Stage prefix: Initial diagnosis Total positive nodes: 0    03/21/2021 Initial Diagnosis   Adenocarcinoma of rectum ([facility]   03/28/2021 -  Chemotherapy    Patient is on Treatment Plan: COLORECTAL CAPECITABINE + XRT       07/17/2021 Cancer Staging   Staging form: Colon and Rectum, AJCC 8th Edition - Pathologic stage from 07/17/2021: ypT0, pN0, cM0 - Signed by FTruitt Merle MD on 07/29/2021 Stage prefix: Post-therapy Total positive nodes: 0 Residual tumor (R): R0 - None       INTERVAL HISTORY:  Steven [name] here for a follow up of rectal cancer. He was last seen by me on 07/30/21. He presents to the clinic alone. He reports he has been having a lot of rectal pain since his surgery, up to 8-9/10. He was prescribed tramadol but only for 5 days and has been taking tylenol since.   All other systems were reviewed with the patient and are negative.  MEDICAL HISTORY:  Past Medical History:  Diagnosis Date   Anemia 02/2021   iron   Anxiety    Asthma    Cancer ([facility] 02/2021   Depression    Diabetes mellitus without  complication ([place])    History of kidney stones    HLD (hyperlipidemia)    Hypertension    Kidney stones    Neuromuscular disorder ([place])    from radiation bi lat   OSA (obstructive sleep apnea)    CPAP   Peripheral vascular disease ([place])    DVT    SURGICAL HISTORY: Past Surgical History:  Procedure Laterality Date   APPENDECTOMY  1989   ag   COLONOSCOPY  03/05/2021   CYST EXCISION  2011   left neck   DIVERTING ILEOSTOMY N/A  07/17/2021   Procedure: DIVERTING ILEOSTOMY;  Surgeon: [name], MD;  Location: [facility];  Service: General;  Laterality: N/A;   WISDOM TOOTH EXTRACTION     XI ROBOTIC ASSISTED LOWER ANTERIOR RESECTION N/A 07/17/2021   Procedure: XI ROBOTIC ASSISTED LOWER ANTERIOR RESECTION;  Surgeon: [name], MD;  Location: [facility];  Service: General;  Laterality: N/A;    I have reviewed the social history and family history with the patient and they are unchanged from previous note.  ALLERGIES:  is allergic to contrast media [iodinated contrast media].  MEDICATIONS:  Current Outpatient Medications  Medication Sig Dispense Refill   acetaminophen (TYLENOL) 500 MG tablet Take 1,000 mg by mouth every 6 (six) hours as needed for mild pain.     albuterol (VENTOLIN HFA) 108 (90 Base) MCG/ACT inhaler Inhale 2 puffs into the lungs every 4 (four) hours as needed for wheezing.     ALPRAZolam (XANAX) 0.25 MG tablet Take 0.25 mg by mouth daily as needed for anxiety or sleep.     blood glucose meter kit and supplies KIT Dispense based on patient and insurance preference. Use up to four times daily as directed. (FOR ICD-9 250.00, 250.01). 1 each 0   capecitabine (XELODA) 500 MG tablet Take 4 tablets (2,000 mg total) by mouth 2 (two) times daily after a meal. 112 tablet 1   Cinnamon 500 MG capsule Take 500 mg by mouth daily with supper.     fenofibrate 160 MG tablet Take 160 mg by mouth daily.     irbesartan (AVAPRO) 150 MG tablet Take 150 mg by mouth daily.     loperamide (IMODIUM) 2 MG capsule Take 2 capsules (4 mg total) by mouth 4 (four) times daily -  before meals and at bedtime. 30 capsule 0   ondansetron (ZOFRAN) 8 MG tablet Take 1 tablet (8 mg total) by mouth 2 (two) times daily as needed (Nausea or vomiting). 30 tablet 1   polycarbophil (FIBERCON) 625 MG tablet Take 1,250 mg by mouth in the morning and at bedtime.     prochlorperazine (COMPAZINE) 10 MG tablet Take 1 tablet (10 mg total) by mouth every 6  (six) hours as needed (Nausea or vomiting). 30 tablet 1   tetrahydrozoline-zinc (VISINE-AC) 0.05-0.25 % ophthalmic solution Place 2 drops into both eyes 4 (four) times daily as needed (allergy).     traMADol (ULTRAM) 50 MG tablet Take 1-2 tablets (50-100 mg total) by mouth every 6 (six) hours as needed for moderate pain. 30 tablet 0   XARELTO 20 MG TABS tablet Take 20 mg by mouth daily with supper.     XIGDUO XR 01-999 MG TB24 Take 1 tablet by mouth 2 (two) times daily with a meal.     No current facility-administered medications for this visit.    PHYSICAL EXAMINATION: ECOG PERFORMANCE STATUS: 1 - Symptomatic but completely ambulatory  Vitals:   10/04/21 1148  BP: 135/71  Pulse:  97  Resp: 18  Temp: 97.7 F (36.5 C)  SpO2: 100%   Wt Readings from Last 3 Encounters:  10/04/21 199 lb 6.4 oz (90.4 kg)  09/06/21 199 lb 8 oz (90.5 kg)  08/07/21 201 lb (91.2 kg)     GENERAL:alert, no distress and comfortable SKIN: skin color normal, no rashes or significant lesions EYES: normal, Conjunctiva are pink and non-injected, sclera clear  NEURO: alert & oriented x 3 with fluent speech  LABORATORY DATA:  I have reviewed the data as listed CBC Latest Ref Rng & Units 10/04/2021 09/06/2021 07/19/2021  WBC 4.0 - 10.5 K/uL 6.6 6.4 5.3  Hemoglobin 13.0 - 17.0 g/dL 12.9(L) 12.9(L) 13.1  Hematocrit 39.0 - 52.0 % 39.9 39.6 39.4  Platelets 150 - 400 K/uL 188 228 149(L)     CMP Latest Ref Rng & Units 10/04/2021 09/06/2021 07/20/2021  Glucose 70 - 99 mg/dL 203(H) 314(H) 165(H)  BUN 6 - 20 mg/dL 31(H) 30(H) 15  Creatinine 0.61 - 1.24 mg/dL 0.87 1.26(H) 0.71  Sodium 135 - 145 mmol/L 135 136 134(L)  Potassium 3.5 - 5.1 mmol/L 4.8 4.4 4.0  Chloride 98 - 111 mmol/L 104 102 100  CO2 22 - 32 mmol/L 23 23 23   Calcium 8.9 - 10.3 mg/dL 9.8 9.2 9.1  Total Protein 6.5 - 8.1 g/dL 7.4 7.1 -  Total Bilirubin 0.3 - 1.2 mg/dL 0.8 0.5 -  Alkaline Phos 38 - 126 U/L 76 89 -  AST 15 - 41 U/L 15 11(L) -  ALT 0 - 44  U/L 19 15 -      RADIOGRAPHIC STUDIES: I have personally reviewed the radiological images as listed and agreed with the findings in the report. No results found.    No orders of the defined types were placed in this encounter.   All questions were answered. The patient knows to call the clinic with any problems, questions or concerns. No barriers to learning was detected. The total time spent in the appointment was 30 minutes.     [name], MD 10/04/2021   I, Wilburn Mylar, am acting as scribe for Truitt Merle, MD.   I have reviewed the above documentation for accuracy and completeness, and I agree with the above.

## 2021-10-09 ENCOUNTER — Other Ambulatory Visit: Payer: Self-pay

## 2021-10-21 ENCOUNTER — Inpatient Hospital Stay (HOSPITAL_BASED_OUTPATIENT_CLINIC_OR_DEPARTMENT_OTHER): Payer: BC Managed Care – PPO | Admitting: Hematology and Oncology

## 2021-10-21 ENCOUNTER — Encounter: Payer: Self-pay | Admitting: Hematology and Oncology

## 2021-10-21 ENCOUNTER — Inpatient Hospital Stay: Payer: BC Managed Care – PPO

## 2021-10-21 ENCOUNTER — Other Ambulatory Visit: Payer: Self-pay

## 2021-10-21 VITALS — BP 136/73 | HR 95 | Temp 97.9°F | Resp 18 | Ht 70.0 in | Wt 200.1 lb

## 2021-10-21 DIAGNOSIS — T451X5A Adverse effect of antineoplastic and immunosuppressive drugs, initial encounter: Secondary | ICD-10-CM

## 2021-10-21 DIAGNOSIS — C20 Malignant neoplasm of rectum: Secondary | ICD-10-CM

## 2021-10-21 DIAGNOSIS — Z85048 Personal history of other malignant neoplasm of rectum, rectosigmoid junction, and anus: Secondary | ICD-10-CM | POA: Diagnosis not present

## 2021-10-21 DIAGNOSIS — I2699 Other pulmonary embolism without acute cor pulmonale: Secondary | ICD-10-CM | POA: Diagnosis not present

## 2021-10-21 DIAGNOSIS — D649 Anemia, unspecified: Secondary | ICD-10-CM | POA: Diagnosis not present

## 2021-10-21 DIAGNOSIS — R110 Nausea: Secondary | ICD-10-CM | POA: Diagnosis not present

## 2021-10-21 DIAGNOSIS — Z79899 Other long term (current) drug therapy: Secondary | ICD-10-CM | POA: Diagnosis not present

## 2021-10-21 DIAGNOSIS — Z7901 Long term (current) use of anticoagulants: Secondary | ICD-10-CM | POA: Diagnosis not present

## 2021-10-21 DIAGNOSIS — Z86718 Personal history of other venous thrombosis and embolism: Secondary | ICD-10-CM | POA: Diagnosis not present

## 2021-10-21 DIAGNOSIS — Z86711 Personal history of pulmonary embolism: Secondary | ICD-10-CM | POA: Diagnosis not present

## 2021-10-21 LAB — CBC WITH DIFFERENTIAL/PLATELET
Abs Immature Granulocytes: 0.01 10*3/uL (ref 0.00–0.07)
Basophils Absolute: 0 10*3/uL (ref 0.0–0.1)
Basophils Relative: 0 %
Eosinophils Absolute: 0.1 10*3/uL (ref 0.0–0.5)
Eosinophils Relative: 2 %
HCT: 36.7 % — ABNORMAL LOW (ref 39.0–52.0)
Hemoglobin: 12.4 g/dL — ABNORMAL LOW (ref 13.0–17.0)
Immature Granulocytes: 0 %
Lymphocytes Relative: 28 %
Lymphs Abs: 1.4 10*3/uL (ref 0.7–4.0)
MCH: 27.3 pg (ref 26.0–34.0)
MCHC: 33.8 g/dL (ref 30.0–36.0)
MCV: 80.8 fL (ref 80.0–100.0)
Monocytes Absolute: 0.4 10*3/uL (ref 0.1–1.0)
Monocytes Relative: 9 %
Neutro Abs: 3.1 10*3/uL (ref 1.7–7.7)
Neutrophils Relative %: 61 %
Platelets: 227 10*3/uL (ref 150–400)
RBC: 4.54 MIL/uL (ref 4.22–5.81)
RDW: 19.7 % — ABNORMAL HIGH (ref 11.5–15.5)
WBC: 5.1 10*3/uL (ref 4.0–10.5)
nRBC: 0 % (ref 0.0–0.2)

## 2021-10-21 LAB — COMPREHENSIVE METABOLIC PANEL
ALT: 19 U/L (ref 0–44)
AST: 15 U/L (ref 15–41)
Albumin: 3.9 g/dL (ref 3.5–5.0)
Alkaline Phosphatase: 98 U/L (ref 38–126)
Anion gap: 9 (ref 5–15)
BUN: 28 mg/dL — ABNORMAL HIGH (ref 6–20)
CO2: 23 mmol/L (ref 22–32)
Calcium: 9.5 mg/dL (ref 8.9–10.3)
Chloride: 104 mmol/L (ref 98–111)
Creatinine, Ser: 0.98 mg/dL (ref 0.61–1.24)
GFR, Estimated: >60 mL/min (ref >60)
Glucose, Bld: 322 mg/dL — ABNORMAL HIGH (ref 70–99)
Potassium: 4.3 mmol/L (ref 3.5–5.1)
Sodium: 136 mmol/L (ref 135–145)
Total Bilirubin: 0.7 mg/dL (ref 0.3–1.2)
Total Protein: 7.2 g/dL (ref 6.5–8.1)

## 2021-10-21 LAB — CEA (IN HOUSE-CHCC): CEA (CHCC-In House): 2.82 ng/mL (ref 0.00–5.00)

## 2021-10-21 MED ORDER — CAPECITABINE 500 MG PO TABS: 1000.0000 mg/m2 | ORAL_TABLET | ORAL | 2 refills | Status: DC

## 2021-10-21 NOTE — Progress Notes (Signed)
[facility]   [phone] [phone]) [phone]   Clinic Follow up Note   Patient Care Team: Shirline Frees, MD as PCP - General (Family Medicine) [name], MD as Consulting Physician (Hematology and Oncology) Jonnie Finner, RN (Inactive) as Oncology Nurse Navigator  Date of Service:  10/21/2021  CHIEF COMPLAINT: f/u of rectal cancer  CURRENT THERAPY:  Adjuvant Xeloda  ASSESSMENT & PLAN:   Steven [name] is a 51 y.o. male with   1. Adenocarcinoma of rectum, cT3N0M0, ypT0N0, MSS -He underwent CT AP 11/23/20 to rule out malignancy after PE. This showed asymmetric soft tissue in rectum. -colonoscopy 03/05/21 with Dr. Rush Landmark revealed a palpable rectal mass on digital rectal exam, measuring 5 cm on scope. Pathology confirmed adenocarcinoma. -staging CT CAP 03/14/21 showed questionable tiny perirectal lymph nodes, but negative node on staging pelvic MRI 03/15/21. -he received neoadjuvant concurrent chemoradiation 04/03/21-05/10/21. -definitive resection on 07/17/21 under Dr. Marcello Moores. Pathology showed complete response with no residual carcinoma. Lymph nodes and margins negative. -He saw Dr. Marcello Moores on 08/19/21 for increasing postoperative rectal pain and was found to have some anastomotic dehiscence. Because of the slow healing and lack of improvement from antibiotics, there was delay in starting Xeloda and he was finally able to start Xeloda on 09/09/2021.  Cycle 2-day 1 on 09/30/2021.  He is here for before cycle 3-day 1. - He has been tolerating the current dose of Xeloda, he was started on 1500 p.o. twice daily very well.  Today we have discussed about increasing the dose to 2000 mg in a.m. and 1500 milligrams in p.m. since he has been able to tolerate the previous dose very well.  He was reluctant but he agreed to try this dose.   C2D1 09/30/2021. ROS today pertinent for some gum pain which is very random and mild. No nausea, vomiting, mouth sores, diarrhea,  HFS.  No major concerning ROS except for intermittent abdominal cramping and some mucousy discharge.  Physical examination unremarkable.  Again could not examine the ostomy bag, he is very an opaque pack.  From his review, stoma appears red and healthy. We have once again discussed about surveillance plans.  He continues to tolerate Xeloda very well so far.  He will return to clinic in 3 weeks.   2. Bilateral pulmonary embolism -first episode in 03/2019 and again in 06/2020 -on Xarelto since 07/2020, plan to stop after he completes adjuvant chemo  No new concerns for DVT/PE   #3.  Chemotherapy-induced nausea, okay to use as needed Zofran.   PLAN: -Change her dose of Xeloda to 2000 mg every morning and 1500 mg every afternoon for 2 weeks on and 1 week off, new prescription refilled.-lab and f/u before C4D1   No problem-specific Assessment & Plan notes found for this encounter.  SUMMARY OF ONCOLOGIC HISTORY: Oncology History  Adenocarcinoma of rectum ([place])  03/05/2021 Cancer Staging   Staging form: Colon and Rectum, AJCC 8th Edition - Clinical stage from 03/05/2021: Stage IIA (cT3, cN0, cM0) - Signed by Truitt Merle, MD on 07/29/2021 Stage prefix: Initial diagnosis Total positive nodes: 0    03/21/2021 Initial Diagnosis   Adenocarcinoma of rectum ([place])   03/28/2021 -  Chemotherapy    Patient is on Treatment Plan: COLORECTAL CAPECITABINE + XRT       07/17/2021 Cancer Staging   Staging form: Colon and Rectum, AJCC 8th Edition - Pathologic stage from 07/17/2021: ypT0, pN0, cM0 - Signed by Truitt Merle, MD on 07/29/2021 Stage prefix: Post-therapy Total  positive nodes: 0 Residual tumor (R): R0 - None       INTERVAL HISTORY:  Steven [name] is here for a follow up of rectal cancer.   He is here for follow up with adjuvant capecitabine. He repors some abdominal cramping like he has to go and have a bowel movement. He has noticed some mucousy discharge per ostomy, once he passes this  discharge, his abdominal pain resolves. No nausea, vomiting, increased stool output from ostomy tube. No fevers or chills. No skin rash No change in urinary hbaits  All other systems were reviewed with the patient and are negative.  MEDICAL HISTORY:  Past Medical History:  Diagnosis Date   Anemia 02/2021   iron   Anxiety    Asthma    Cancer ([place]) 02/2021   Depression    Diabetes mellitus without complication ([place])    History of kidney stones    HLD (hyperlipidemia)    Hypertension    Kidney stones    Neuromuscular disorder ([facility])    from radiation bi lat   OSA (obstructive sleep apnea)    CPAP   Peripheral vascular disease ([place])    DVT    SURGICAL HISTORY: Past Surgical History:  Procedure Laterality Date   APPENDECTOMY  1989   ag   COLONOSCOPY  03/05/2021   CYST EXCISION  2011   left neck   DIVERTING ILEOSTOMY N/A 07/17/2021   Procedure: DIVERTING ILEOSTOMY;  Surgeon: [name], MD;  Location: [facility];  Service: General;  Laterality: N/A;   WISDOM TOOTH EXTRACTION     XI ROBOTIC ASSISTED LOWER ANTERIOR RESECTION N/A 07/17/2021   Procedure: XI ROBOTIC ASSISTED LOWER ANTERIOR RESECTION;  Surgeon: [name], MD;  Location: [facility];  Service: General;  Laterality: N/A;    I have reviewed the social history and family history with the patient and they are unchanged from previous note.  ALLERGIES:  is allergic to contrast media [iodinated contrast media].  MEDICATIONS:  Current Outpatient Medications  Medication Sig Dispense Refill   acetaminophen (TYLENOL) 500 MG tablet Take 1,000 mg by mouth every 6 (six) hours as needed for mild pain.     albuterol (VENTOLIN HFA) 108 (90 Base) MCG/ACT inhaler Inhale 2 puffs into the lungs every 4 (four) hours as needed for wheezing.     ALPRAZolam (XANAX) 0.25 MG tablet Take 0.25 mg by mouth daily as needed for anxiety or sleep.     blood glucose meter kit and supplies KIT Dispense based on patient and insurance preference.  Use up to four times daily as directed. (FOR ICD-9 250.00, 250.01). 1 each 0   capecitabine (XELODA) 500 MG tablet Take 4 tablets (2,000 mg total) by mouth 2 (two) times daily after a meal. 112 tablet 1   Cinnamon 500 MG capsule Take 500 mg by mouth daily with supper.     fenofibrate 160 MG tablet Take 160 mg by mouth daily.     loperamide (IMODIUM) 2 MG capsule Take 2 capsules (4 mg total) by mouth 4 (four) times daily -  before meals and at bedtime. 30 capsule 0   ondansetron (ZOFRAN) 8 MG tablet Take 1 tablet (8 mg total) by mouth 2 (two) times daily as needed (Nausea or vomiting). 30 tablet 1   polycarbophil (FIBERCON) 625 MG tablet Take 1,250 mg by mouth in the morning and at bedtime.     prochlorperazine (COMPAZINE) 10 MG tablet Take 1 tablet (10 mg total) by mouth every 6 (six) hours as  needed (Nausea or vomiting). 30 tablet 1   tetrahydrozoline-zinc (VISINE-AC) 0.05-0.25 % ophthalmic solution Place 2 drops into both eyes 4 (four) times daily as needed (allergy).     traMADol (ULTRAM) 50 MG tablet Take 1-2 tablets (50-100 mg total) by mouth every 6 (six) hours as needed for moderate pain. 30 tablet 0   XARELTO 20 MG TABS tablet Take 20 mg by mouth daily with supper.     XIGDUO XR 01-999 MG TB24 Take 1 tablet by mouth 2 (two) times daily with a meal.     No current facility-administered medications for this visit.    PHYSICAL EXAMINATION: ECOG PERFORMANCE STATUS: 1 - Symptomatic but completely ambulatory  Vitals:   10/21/21 1152  BP: 136/73  Pulse: 95  Resp: 18  Temp: 97.9 F (36.6 C)  SpO2: 100%   Wt Readings from Last 3 Encounters:  10/21/21 200 lb 2 oz (90.8 kg)  10/04/21 199 lb 6.4 oz (90.4 kg)  09/06/21 199 lb 8 oz (90.5 kg)     GENERAL:alert, no distress and comfortable SKIN: skin color normal, no rashes or significant lesions EYES: normal, Conjunctiva are pink and non-injected, sclera clear  Mouth: NO mouth sores NEURO: alert & oriented x 3 with fluent  speech Abdomen: benign Chest: CTA bilaterally  LABORATORY DATA:  I have reviewed the data as listed CBC Latest Ref Rng & Units 10/21/2021 10/04/2021 09/06/2021  WBC 4.0 - 10.5 K/uL 5.1 6.6 6.4  Hemoglobin 13.0 - 17.0 g/dL 12.4(L) 12.9(L) 12.9(L)  Hematocrit 39.0 - 52.0 % 36.7(L) 39.9 39.6  Platelets 150 - 400 K/uL 227 188 228     CMP Latest Ref Rng & Units 10/04/2021 09/06/2021 07/20/2021  Glucose 70 - 99 mg/dL 203(H) 314(H) 165(H)  BUN 6 - 20 mg/dL 31(H) 30(H) 15  Creatinine 0.61 - 1.24 mg/dL 0.87 1.26(H) 0.71  Sodium 135 - 145 mmol/L 135 136 134(L)  Potassium 3.5 - 5.1 mmol/L 4.8 4.4 4.0  Chloride 98 - 111 mmol/L 104 102 100  CO2 22 - 32 mmol/L _0 Calcium 8.9 - 10.3 mg/dL 9.8 9.2 9.1  Total Protein 6.5 - 8.1 g/dL 7.4 7.1 -  Total Bilirubin 0.3 - 1.2 mg/dL 0.8 0.5 -  Alkaline Phos 38 - 126 U/L 76 89 -  AST 15 - 41 U/L 15 11(L) -  ALT 0 - 44 U/L 19 15 -      RADIOGRAPHIC STUDIES: I have personally reviewed the radiological images as listed and agreed with the findings in the report.  No results found.    Labs reviewed, CBC with mildly low hemoglobin, otherwise unremarkable.  CMP with elevated glucose, otherwise unremarkable.  CEA normal at 2.82.  No orders of the defined types were placed in this encounter.   All questions were answered. The patient knows to call the clinic with any problems, questions or concerns. No barriers to learning was detected. The total time spent in the appointment was 30 minutes.     [name], MD 10/21/2021

## 2021-10-23 ENCOUNTER — Telehealth: Payer: Self-pay | Admitting: *Deleted

## 2021-10-23 NOTE — Telephone Encounter (Addendum)
This RN spoke with pt - which he too noted on My Chart - he states he has already contacted Dr Kenton Kingfisher who is managing his blood sugars. Steven [name] is on a long acting oral medication - Xigduo XR - which he is wondering if due to having an ileostomy he is not absorbing the medication properly for best outcome.  Steven [name] states he will be having labs for further evaluation and discussion of possible need for medication change with Dr Kenton Kingfisher.  This note will be forwarded to his primary MD for review of recommendations per Dr Chryl Heck.   ----- Message from [name], MD sent at 10/23/2021  8:32 AM EST ----- His blood sugars appears to be uncontrolled. Can you talk to him if he can see his PCP about better glycemic control>  Thanks,

## 2021-10-24 ENCOUNTER — Other Ambulatory Visit: Payer: Self-pay | Admitting: *Deleted

## 2021-10-24 MED ORDER — CAPECITABINE 500 MG PO TABS: 1000.0000 mg/m2 | ORAL_TABLET | ORAL | 2 refills | Status: DC

## 2021-10-25 DIAGNOSIS — Z932 Ileostomy status: Secondary | ICD-10-CM | POA: Diagnosis not present

## 2021-10-25 DIAGNOSIS — C20 Malignant neoplasm of rectum: Secondary | ICD-10-CM | POA: Diagnosis not present

## 2021-10-30 ENCOUNTER — Other Ambulatory Visit (HOSPITAL_COMMUNITY): Payer: Self-pay

## 2021-11-07 ENCOUNTER — Telehealth: Payer: Self-pay | Admitting: Hematology and Oncology

## 2021-11-07 NOTE — Telephone Encounter (Signed)
Rescheduled appointment per providers template. Left message with new appointment details.

## 2021-11-13 ENCOUNTER — Encounter: Payer: Self-pay | Admitting: Hematology and Oncology

## 2021-11-13 ENCOUNTER — Other Ambulatory Visit (HOSPITAL_COMMUNITY): Payer: Self-pay

## 2021-11-15 ENCOUNTER — Other Ambulatory Visit: Payer: Self-pay

## 2021-11-15 ENCOUNTER — Inpatient Hospital Stay: Payer: BC Managed Care – PPO | Attending: Hematology and Oncology

## 2021-11-15 ENCOUNTER — Inpatient Hospital Stay: Payer: BC Managed Care – PPO

## 2021-11-15 ENCOUNTER — Encounter: Payer: Self-pay | Admitting: Hematology and Oncology

## 2021-11-15 ENCOUNTER — Inpatient Hospital Stay: Payer: BC Managed Care – PPO | Admitting: Hematology and Oncology

## 2021-11-15 ENCOUNTER — Inpatient Hospital Stay (HOSPITAL_BASED_OUTPATIENT_CLINIC_OR_DEPARTMENT_OTHER): Payer: BC Managed Care – PPO | Admitting: Hematology and Oncology

## 2021-11-15 VITALS — BP 133/73 | HR 105 | Temp 98.1°F | Resp 16 | Ht 70.0 in | Wt 193.8 lb

## 2021-11-15 DIAGNOSIS — C20 Malignant neoplasm of rectum: Secondary | ICD-10-CM

## 2021-11-15 DIAGNOSIS — T451X5A Adverse effect of antineoplastic and immunosuppressive drugs, initial encounter: Secondary | ICD-10-CM

## 2021-11-15 DIAGNOSIS — I2699 Other pulmonary embolism without acute cor pulmonale: Secondary | ICD-10-CM

## 2021-11-15 DIAGNOSIS — R110 Nausea: Secondary | ICD-10-CM

## 2021-11-15 DIAGNOSIS — Z86718 Personal history of other venous thrombosis and embolism: Secondary | ICD-10-CM | POA: Diagnosis not present

## 2021-11-15 DIAGNOSIS — Z9221 Personal history of antineoplastic chemotherapy: Secondary | ICD-10-CM | POA: Insufficient documentation

## 2021-11-15 DIAGNOSIS — Z7901 Long term (current) use of anticoagulants: Secondary | ICD-10-CM | POA: Diagnosis not present

## 2021-11-15 DIAGNOSIS — Z86711 Personal history of pulmonary embolism: Secondary | ICD-10-CM | POA: Insufficient documentation

## 2021-11-15 DIAGNOSIS — Z85048 Personal history of other malignant neoplasm of rectum, rectosigmoid junction, and anus: Secondary | ICD-10-CM | POA: Diagnosis not present

## 2021-11-15 DIAGNOSIS — Z923 Personal history of irradiation: Secondary | ICD-10-CM | POA: Insufficient documentation

## 2021-11-15 LAB — CBC WITH DIFFERENTIAL/PLATELET
Abs Immature Granulocytes: 0.02 10*3/uL (ref 0.00–0.07)
Basophils Absolute: 0 10*3/uL (ref 0.0–0.1)
Basophils Relative: 0 %
Eosinophils Absolute: 0.1 10*3/uL (ref 0.0–0.5)
Eosinophils Relative: 2 %
HCT: 37.6 % — ABNORMAL LOW (ref 39.0–52.0)
Hemoglobin: 12 g/dL — ABNORMAL LOW (ref 13.0–17.0)
Immature Granulocytes: 0 %
Lymphocytes Relative: 24 %
Lymphs Abs: 1.6 10*3/uL (ref 0.7–4.0)
MCH: 26.6 pg (ref 26.0–34.0)
MCHC: 31.9 g/dL (ref 30.0–36.0)
MCV: 83.4 fL (ref 80.0–100.0)
Monocytes Absolute: 0.6 10*3/uL (ref 0.1–1.0)
Monocytes Relative: 9 %
Neutro Abs: 4.3 10*3/uL (ref 1.7–7.7)
Neutrophils Relative %: 65 %
Platelets: 261 10*3/uL (ref 150–400)
RBC: 4.51 MIL/uL (ref 4.22–5.81)
RDW: 18.3 % — ABNORMAL HIGH (ref 11.5–15.5)
WBC: 6.6 10*3/uL (ref 4.0–10.5)
nRBC: 0 % (ref 0.0–0.2)

## 2021-11-15 LAB — COMPREHENSIVE METABOLIC PANEL
ALT: 14 U/L (ref 0–44)
AST: 12 U/L — ABNORMAL LOW (ref 15–41)
Albumin: 4 g/dL (ref 3.5–5.0)
Alkaline Phosphatase: 84 U/L (ref 38–126)
Anion gap: 11 (ref 5–15)
BUN: 22 mg/dL — ABNORMAL HIGH (ref 6–20)
CO2: 27 mmol/L (ref 22–32)
Calcium: 10 mg/dL (ref 8.9–10.3)
Chloride: 101 mmol/L (ref 98–111)
Creatinine, Ser: 0.99 mg/dL (ref 0.61–1.24)
GFR, Estimated: >60 mL/min (ref >60)
Glucose, Bld: 270 mg/dL — ABNORMAL HIGH (ref 70–99)
Potassium: 4.7 mmol/L (ref 3.5–5.1)
Sodium: 139 mmol/L (ref 135–145)
Total Bilirubin: 0.6 mg/dL (ref 0.3–1.2)
Total Protein: 7.4 g/dL (ref 6.5–8.1)

## 2021-11-15 LAB — CEA (IN HOUSE-CHCC): CEA (CHCC-In House): 2.81 ng/mL (ref 0.00–5.00)

## 2021-11-15 NOTE — Progress Notes (Signed)
[place]   Telephone:(336) [phone] Fax:(336) [phone]   Clinic Follow up Note   Patient Care Team: Shirline Frees, MD as PCP - General (Family Medicine) [name], MD as Consulting Physician (Hematology and Oncology) Jonnie Finner, RN (Inactive) as Oncology Nurse Navigator  Date of Service:  11/15/2021  CHIEF COMPLAINT: f/u of rectal cancer  CURRENT THERAPY:  Adjuvant Xeloda  ASSESSMENT & PLAN:   Steven [name] is a 51 y.o. male with   1. Adenocarcinoma of rectum, cT3N0M0, ypT0N0, MSS -He underwent CT AP 11/23/20 to rule out malignancy after PE. This showed asymmetric soft tissue in rectum. -colonoscopy 03/05/21 with Dr. Rush Landmark revealed a palpable rectal mass on digital rectal exam, measuring 5 cm on scope. Pathology confirmed adenocarcinoma. -staging CT CAP 03/14/21 showed questionable tiny perirectal lymph nodes, but negative node on staging pelvic MRI 03/15/21. -he received neoadjuvant concurrent chemoradiation 04/03/21-05/10/21. -definitive resection on 07/17/21 under Dr. Marcello Moores. Pathology showed complete response with no residual carcinoma. Lymph nodes and margins negative. -He saw Dr. Marcello Moores on 08/19/21 for increasing postoperative rectal pain and was found to have some anastomotic dehiscence. Because of the slow healing and lack of improvement from antibiotics, there was delay in starting Xeloda and he was finally able to start Xeloda on 09/09/2021.   Cycle 2-day 1 on 09/30/2021.   Cycle 3 day 1 on 10/21/2021 C4D1 started on 11/11/2021. No concerning review of systems besides fatigue today. Physical examination unremarkable, no major toxicity from capecitabine. Okay to proceed with treatment as planned. He will return to clinic in 3 weeks.  Labs reviewed, no concerns except for stable anemia.   2. Bilateral pulmonary embolism -first episode in 03/2019 and again in 06/2020 -on Xarelto since 07/2020, plan to stop after he completes adjuvant chemo  No  new concerns for DVT/PE  3.  Pain related today since, has been taking Tylenol and ibuprofen as needed. 4.  Chemotherapy-induced nausea, takes Zofran and Compazine as needed, has not needed this medication recently.  PLAN: As mentioned above  SUMMARY OF ONCOLOGIC HISTORY: Oncology History  Adenocarcinoma of rectum ([place])  03/05/2021 Cancer Staging   Staging form: Colon and Rectum, AJCC 8th Edition - Clinical stage from 03/05/2021: Stage IIA (cT3, cN0, cM0) - Signed by Truitt Merle, MD on 07/29/2021 Stage prefix: Initial diagnosis Total positive nodes: 0    03/21/2021 Initial Diagnosis   Adenocarcinoma of rectum ([place])   03/28/2021 -  Chemotherapy    Patient is on Treatment Plan: COLORECTAL CAPECITABINE + XRT       07/17/2021 Cancer Staging   Staging form: Colon and Rectum, AJCC 8th Edition - Pathologic stage from 07/17/2021: ypT0, pN0, cM0 - Signed by Truitt Merle, MD on 07/29/2021 Stage prefix: Post-therapy Total positive nodes: 0 Residual tumor (R): R0 - None     INTERVAL HISTORY:  Steven [name] is here for a follow up of rectal cancer.   He is here for follow up with adjuvant capecitabine.  Since his last visit, he feels a bit more tired with the increased dose but overall is tolerating 2000 mg in p.m. and 1500 mg every morning well.  He also reports that he is not able to eat as much food and he has been losing weight although his appetite is reasonable. No mouth sores, increased nausea or vomiting or output from the ostomy tube. No change in bowel habits or urinary habits. All other systems were reviewed with the patient and are negative.  MEDICAL HISTORY:  Past Medical History:  Diagnosis Date   Anemia 02/2021   iron   Anxiety    Asthma    Cancer ([place]) 02/2021   Depression    Diabetes mellitus without complication ([place])    History of kidney stones    HLD (hyperlipidemia)    Hypertension    Kidney stones    Neuromuscular disorder ([place])    from radiation bi lat    OSA (obstructive sleep apnea)    CPAP   Peripheral vascular disease ([place])    DVT    SURGICAL HISTORY: Past Surgical History:  Procedure Laterality Date   APPENDECTOMY  1989   ag   COLONOSCOPY  03/05/2021   CYST EXCISION  2011   left neck   DIVERTING ILEOSTOMY N/A 07/17/2021   Procedure: DIVERTING ILEOSTOMY;  Surgeon: [name], MD;  Location: [facility];  Service: General;  Laterality: N/A;   WISDOM TOOTH EXTRACTION     XI ROBOTIC ASSISTED LOWER ANTERIOR RESECTION N/A 07/17/2021   Procedure: XI ROBOTIC ASSISTED LOWER ANTERIOR RESECTION;  Surgeon: [name], MD;  Location: [facility];  Service: General;  Laterality: N/A;    I have reviewed the social history and family history with the patient and they are unchanged from previous note.  ALLERGIES:  is allergic to contrast media [iodinated contrast media].  MEDICATIONS:  Current Outpatient Medications  Medication Sig Dispense Refill   acetaminophen (TYLENOL) 500 MG tablet Take 1,000 mg by mouth every 6 (six) hours as needed for mild pain.     albuterol (VENTOLIN HFA) 108 (90 Base) MCG/ACT inhaler Inhale 2 puffs into the lungs every 4 (four) hours as needed for wheezing.     ALPRAZolam (XANAX) 0.25 MG tablet Take 0.25 mg by mouth daily as needed for anxiety or sleep.     blood glucose meter kit and supplies KIT Dispense based on patient and insurance preference. Use up to four times daily as directed. (FOR ICD-9 250.00, 250.01). 1 each 0   capecitabine (XELODA) 500 MG tablet Take 4 tablets (2,000 mg total) by mouth as directed. Take 4 tablets in the morning, 3 in the evening daily x 14 days then 7 days off then repeat cycle 98 tablet 2   Cinnamon 500 MG capsule Take 500 mg by mouth daily with supper.     fenofibrate 160 MG tablet Take 160 mg by mouth daily.     loperamide (IMODIUM) 2 MG capsule Take 2 capsules (4 mg total) by mouth 4 (four) times daily -  before meals and at bedtime. 30 capsule 0   ondansetron (ZOFRAN) 8 MG tablet  Take 1 tablet (8 mg total) by mouth 2 (two) times daily as needed (Nausea or vomiting). 30 tablet 1   polycarbophil (FIBERCON) 625 MG tablet Take 1,250 mg by mouth in the morning and at bedtime.     prochlorperazine (COMPAZINE) 10 MG tablet Take 1 tablet (10 mg total) by mouth every 6 (six) hours as needed (Nausea or vomiting). 30 tablet 1   tetrahydrozoline-zinc (VISINE-AC) 0.05-0.25 % ophthalmic solution Place 2 drops into both eyes 4 (four) times daily as needed (allergy).     traMADol (ULTRAM) 50 MG tablet Take 1-2 tablets (50-100 mg total) by mouth every 6 (six) hours as needed for moderate pain. 30 tablet 0   XARELTO 20 MG TABS tablet Take 20 mg by mouth daily with supper.     XIGDUO XR 01-999 MG TB24 Take 1 tablet by mouth 2 (two) times daily with a meal.  No current facility-administered medications for this visit.    PHYSICAL EXAMINATION: ECOG PERFORMANCE STATUS: 1 - Symptomatic but completely ambulatory  There were no vitals filed for this visit.  Wt Readings from Last 3 Encounters:  10/21/21 200 lb 2 oz (90.8 kg)  10/04/21 199 lb 6.4 oz (90.4 kg)  09/06/21 199 lb 8 oz (90.5 kg)    GENERAL:alert, no distress and comfortable SKIN: skin color normal, no rashes or significant lesions EYES: normal, Conjunctiva are pink and non-injected, sclera clear  Mouth: NO mouth sores NEURO: alert & oriented x 3 with fluent speech Abdomen: benign Chest: CTA bilaterally No change in physical exam since last visit except for some weight loss.  LABORATORY DATA:  I have reviewed the data as listed CBC Latest Ref Rng & Units 10/21/2021 10/04/2021 09/06/2021  WBC 4.0 - 10.5 K/uL 5.1 6.6 6.4  Hemoglobin 13.0 - 17.0 g/dL 12.4(L) 12.9(L) 12.9(L)  Hematocrit 39.0 - 52.0 % 36.7(L) 39.9 39.6  Platelets 150 - 400 K/uL 227 188 228     CMP Latest Ref Rng & Units 10/21/2021 10/04/2021 09/06/2021  Glucose 70 - 99 mg/dL 322(H) 203(H) 314(H)  BUN 6 - 20 mg/dL 28(H) 31(H) 30(H)  Creatinine 0.61 - 1.24  mg/dL 0.98 0.87 1.26(H)  Sodium 135 - 145 mmol/L 136 135 136  Potassium 3.5 - 5.1 mmol/L 4.3 4.8 4.4  Chloride 98 - 111 mmol/L 104 104 102  CO2 22 - 32 mmol/L _0 Calcium 8.9 - 10.3 mg/dL 9.5 9.8 9.2  Total Protein 6.5 - 8.1 g/dL 7.2 7.4 7.1  Total Bilirubin 0.3 - 1.2 mg/dL 0.7 0.8 0.5  Alkaline Phos 38 - 126 U/L 98 76 89  AST 15 - 41 U/L 15 15 11(L)  ALT 0 - 44 U/L _1 RADIOGRAPHIC STUDIES: I have personally reviewed the radiological images as listed and agreed with the findings in the report.  No results found.    Labs reviewed from today.  No concerns.   All questions were answered. The patient knows to call the clinic with any problems, questions or concerns. No barriers to learning was detected. The total time spent in the appointment was 30 minutes.     [name], MD 11/15/2021

## 2021-12-04 ENCOUNTER — Other Ambulatory Visit: Payer: Self-pay | Admitting: *Deleted

## 2021-12-04 DIAGNOSIS — C20 Malignant neoplasm of rectum: Secondary | ICD-10-CM

## 2021-12-05 ENCOUNTER — Inpatient Hospital Stay: Payer: BC Managed Care – PPO | Attending: Hematology and Oncology

## 2021-12-05 ENCOUNTER — Encounter: Payer: Self-pay | Admitting: Adult Health

## 2021-12-05 ENCOUNTER — Other Ambulatory Visit: Payer: Self-pay

## 2021-12-05 ENCOUNTER — Inpatient Hospital Stay (HOSPITAL_BASED_OUTPATIENT_CLINIC_OR_DEPARTMENT_OTHER): Payer: BC Managed Care – PPO | Admitting: Adult Health

## 2021-12-05 VITALS — BP 131/75 | HR 99 | Temp 99.1°F | Resp 15 | Wt 192.2 lb

## 2021-12-05 DIAGNOSIS — Z923 Personal history of irradiation: Secondary | ICD-10-CM | POA: Insufficient documentation

## 2021-12-05 DIAGNOSIS — R5383 Other fatigue: Secondary | ICD-10-CM | POA: Diagnosis not present

## 2021-12-05 DIAGNOSIS — C20 Malignant neoplasm of rectum: Secondary | ICD-10-CM

## 2021-12-05 DIAGNOSIS — Z86718 Personal history of other venous thrombosis and embolism: Secondary | ICD-10-CM | POA: Diagnosis not present

## 2021-12-05 DIAGNOSIS — Z86711 Personal history of pulmonary embolism: Secondary | ICD-10-CM | POA: Diagnosis not present

## 2021-12-05 DIAGNOSIS — Z79899 Other long term (current) drug therapy: Secondary | ICD-10-CM | POA: Diagnosis not present

## 2021-12-05 DIAGNOSIS — Z9221 Personal history of antineoplastic chemotherapy: Secondary | ICD-10-CM | POA: Insufficient documentation

## 2021-12-05 LAB — CBC WITH DIFFERENTIAL (CANCER CENTER ONLY)
Abs Immature Granulocytes: 0.02 10*3/uL (ref 0.00–0.07)
Basophils Absolute: 0 10*3/uL (ref 0.0–0.1)
Basophils Relative: 0 %
Eosinophils Absolute: 0.1 10*3/uL (ref 0.0–0.5)
Eosinophils Relative: 2 %
HCT: 36.8 % — ABNORMAL LOW (ref 39.0–52.0)
Hemoglobin: 11.7 g/dL — ABNORMAL LOW (ref 13.0–17.0)
Immature Granulocytes: 0 %
Lymphocytes Relative: 20 %
Lymphs Abs: 1.3 10*3/uL (ref 0.7–4.0)
MCH: 25.9 pg — ABNORMAL LOW (ref 26.0–34.0)
MCHC: 31.8 g/dL (ref 30.0–36.0)
MCV: 81.4 fL (ref 80.0–100.0)
Monocytes Absolute: 0.5 10*3/uL (ref 0.1–1.0)
Monocytes Relative: 7 %
Neutro Abs: 4.7 10*3/uL (ref 1.7–7.7)
Neutrophils Relative %: 71 %
Platelet Count: 274 10*3/uL (ref 150–400)
RBC: 4.52 MIL/uL (ref 4.22–5.81)
RDW: 17.7 % — ABNORMAL HIGH (ref 11.5–15.5)
WBC Count: 6.6 10*3/uL (ref 4.0–10.5)
nRBC: 0 % (ref 0.0–0.2)

## 2021-12-05 LAB — CMP (CANCER CENTER ONLY)
ALT: 14 U/L (ref 0–44)
AST: 11 U/L — ABNORMAL LOW (ref 15–41)
Albumin: 3.9 g/dL (ref 3.5–5.0)
Alkaline Phosphatase: 92 U/L (ref 38–126)
Anion gap: 8 (ref 5–15)
BUN: 26 mg/dL — ABNORMAL HIGH (ref 6–20)
CO2: 26 mmol/L (ref 22–32)
Calcium: 9.9 mg/dL (ref 8.9–10.3)
Chloride: 101 mmol/L (ref 98–111)
Creatinine: 0.93 mg/dL (ref 0.61–1.24)
GFR, Estimated: >60 mL/min (ref >60)
Glucose, Bld: 244 mg/dL — ABNORMAL HIGH (ref 70–99)
Potassium: 4.9 mmol/L (ref 3.5–5.1)
Sodium: 135 mmol/L (ref 135–145)
Total Bilirubin: 0.5 mg/dL (ref 0.3–1.2)
Total Protein: 7.3 g/dL (ref 6.5–8.1)

## 2021-12-05 LAB — CEA (IN HOUSE-CHCC): CEA (CHCC-In House): 2.56 ng/mL (ref 0.00–5.00)

## 2021-12-05 NOTE — Assessment & Plan Note (Signed)
Steven [name] is a 51 year old man with stage IIa rectal cancer.  He underwent treatment with neoadjuvant chemoradiation, resection, adjuvant Xeloda, ? ?1.  Stage IIa rectal cancer.  He will continue on Xeloda twice daily.  He is tolerating this well. ? ?2.  Fatigue: We talked about methods to ameliorate the level of fatigue he experiences with short walks. ? ?We will see Jovann back for labs, and follow-up with Dr. Chryl Heck in 4 weeks. ?

## 2021-12-05 NOTE — Progress Notes (Signed)
[facility] Follow up:    Steven Frees, MD [address] [place] [address]   DIAGNOSIS:  Cancer Staging  Adenocarcinoma of rectum [facility]) Staging form: Colon and Rectum, AJCC 8th Edition - Clinical stage from 03/05/2021: Stage IIA (cT3, cN0, cM0) - Signed by Truitt Merle, MD on 07/29/2021 Stage prefix: Initial diagnosis Total positive nodes: 0 - Pathologic stage from 07/17/2021: ypT0, pN0, cM0 - Signed by Truitt Merle, MD on 07/29/2021 Stage prefix: Post-therapy Total positive nodes: 0 Residual tumor (R): R0 - None   SUMMARY OF ONCOLOGIC HISTORY: He underwent CT AP 11/23/20 to rule out malignancy after PE. This showed asymmetric soft tissue in rectum. -colonoscopy 03/05/21 with Dr. Rush Landmark revealed a palpable rectal mass on digital rectal exam, measuring 5 cm on scope. Pathology confirmed adenocarcinoma. -staging CT CAP 03/14/21 showed questionable tiny perirectal lymph nodes, but negative node on staging pelvic MRI 03/15/21. -he received neoadjuvant concurrent chemoradiation 04/03/21-05/10/21. -definitive resection on 07/17/21 under Dr. Marcello Moores. Pathology showed complete response with no residual carcinoma. Lymph nodes and margins negative. -He saw Dr. Marcello Moores on 08/19/21 for increasing postoperative rectal pain and was found to have some anastomotic dehiscence. Because of the slow healing and lack of improvement from antibiotics, there was delay in starting Xeloda and he was finally able to start Xeloda on 09/09/2021.  Cycle 2-day 1 on 09/30/2021.  He is here for before cycle 3-day 1.  CURRENT THERAPY: Xeloda  INTERVAL HISTORY: Steven [name] 51 y.o. male returns for evaluation of his history of rectal cancer.  He takes 3 tablets in the morning and 4 in the evening.  He tells me that he is tolerating Xeloda well.  He has no mouth sores, no cough, no shortness of breath, no skin peeling of the hands and feet, or no diarrhea.  His only issue is fatigue.  But he  notes that this is compounded by the fact that his wife broke both of her legs on Christmas Day and has just now started walking.  He is taking Xarelto 20 mg daily and is tolerating this well.  He has no easy bruising or bleeding and notes that his shortness of breath that he experienced with he had the pulmonary embolism has much improved.   Patient Active Problem List   Diagnosis Date Noted   Cancer related pain 04/30/2021   Tenesmus 04/16/2021   Chemotherapy-induced nausea 04/03/2021   Adenocarcinoma of rectum ([place]) 03/21/2021   Colon cancer screening 12/26/2020   Abnormal finding on CT scan 12/26/2020   History of rectal bleeding 12/26/2020   Gastroesophageal reflux disease 12/26/2020   Diabetes mellitus due to underlying condition, uncontrolled, with hyperglycemia, without long-term current use of insulin ([place]) 04/26/2019   Obesity (BMI 30-39.9) 04/26/2019   Asthma    Anxiety    OSA (obstructive sleep apnea)    HLD (hyperlipidemia)    Bilateral pulmonary embolism ([place]) 04/24/2019    is allergic to contrast media [iodinated contrast media].  MEDICAL HISTORY: Past Medical History:  Diagnosis Date   Anemia 02/2021   iron   Anxiety    Asthma    Cancer ([place]) 02/2021   Depression    Diabetes mellitus without complication ([place])    History of kidney stones    HLD (hyperlipidemia)    Hypertension    Kidney stones    Neuromuscular disorder (HCC)    from radiation bi lat   OSA (obstructive sleep apnea)    CPAP   Peripheral vascular  disease [facility])    DVT    SURGICAL HISTORY: Past Surgical History:  Procedure Laterality Date   APPENDECTOMY  1989   ag   COLONOSCOPY  03/05/2021   CYST EXCISION  2011   left neck   DIVERTING ILEOSTOMY N/A 07/17/2021   Procedure: DIVERTING ILEOSTOMY;  Surgeon: [name], MD;  Location: [facility];  Service: General;  Laterality: N/A;   WISDOM TOOTH EXTRACTION     XI ROBOTIC ASSISTED LOWER ANTERIOR RESECTION N/A 07/17/2021   Procedure:  XI ROBOTIC ASSISTED LOWER ANTERIOR RESECTION;  Surgeon: [name], MD;  Location: [facility];  Service: General;  Laterality: N/A;    SOCIAL HISTORY: Social History   Socioeconomic History   Marital status: Married    Spouse name: Not on file   Number of children: 3   Years of education: Not on file   Highest education level: Not on file  Occupational History   Occupation: Gaffer  Tobacco Use   Smoking status: Never   Smokeless tobacco: Never  Vaping Use   Vaping Use: Never used  Substance and Sexual Activity   Alcohol use: Yes    Comment: 1 beer every 3-4 months   Drug use: Never   Sexual activity: Not on file  Other Topics Concern   Not on file  Social History Narrative   Not on file   Social Determinants of Health   Financial Resource Strain: Not on file  Food Insecurity: Not on file  Transportation Needs: Not on file  Physical Activity: Not on file  Stress: Not on file  Social Connections: Not on file  Intimate Partner Violence: Not on file    FAMILY HISTORY: Family History  Problem Relation Age of Onset   Diabetes Mellitus II Mother    Breast cancer Mother 70   Thrombophlebitis Father    Breast cancer Maternal Grandmother 43   Diabetes Maternal Grandmother    Breast cancer Maternal Aunt 60   Diabetes Maternal Aunt    Hypertension Paternal Grandmother    Colon cancer Neg Hx    Esophageal cancer Neg Hx    Inflammatory bowel disease Neg Hx    Liver disease Neg Hx    Pancreatic cancer Neg Hx    Rectal cancer Neg Hx    Stomach cancer Neg Hx     Review of Systems  Constitutional:  Positive for fatigue. Negative for appetite change, chills, fever and unexpected weight change.  HENT:   Negative for hearing loss, lump/mass and trouble swallowing.   Eyes:  Negative for eye problems and icterus.  Respiratory:  Negative for chest tightness, cough and shortness of breath.   Cardiovascular:  Negative for chest pain, leg swelling and palpitations.   Gastrointestinal:  Negative for abdominal distention, abdominal pain, constipation, diarrhea, nausea and vomiting.  Endocrine: Negative for hot flashes.  Genitourinary:  Negative for difficulty urinating.   Musculoskeletal:  Negative for arthralgias.  Skin:  Negative for itching and rash.  Neurological:  Negative for dizziness, extremity weakness, headaches and numbness.  Hematological:  Negative for adenopathy. Does not bruise/bleed easily.  Psychiatric/Behavioral:  Negative for depression. The patient is not nervous/anxious.      PHYSICAL EXAMINATION  ECOG PERFORMANCE STATUS: 1 - Symptomatic but completely ambulatory  Vitals:   12/05/21 1036  BP: 131/75  Pulse: 99  Resp: 15  Temp: 99.1 F (37.3 C)  SpO2: 100%    Physical Exam Constitutional:      General: He is not in acute distress.  Appearance: Normal appearance. He is not toxic-appearing.  HENT:     Head: Normocephalic and atraumatic.  Eyes:     General: No scleral icterus. Cardiovascular:     Rate and Rhythm: Normal rate and regular rhythm.     Pulses: Normal pulses.     Heart sounds: Normal heart sounds.  Pulmonary:     Effort: Pulmonary effort is normal.     Breath sounds: Normal breath sounds.  Abdominal:     General: Abdomen is flat. Bowel sounds are normal. There is no distension.     Palpations: Abdomen is soft.     Tenderness: There is no abdominal tenderness.  Musculoskeletal:        General: No swelling.     Cervical back: Neck supple.  Lymphadenopathy:     Cervical: No cervical adenopathy.  Skin:    General: Skin is warm and dry.     Findings: No rash.  Neurological:     General: No focal deficit present.     Mental Status: He is alert.  Psychiatric:        Mood and Affect: Mood normal.        Behavior: Behavior normal.    LABORATORY DATA:  CBC    Component Value Date/Time   WBC 6.6 12/05/2021 0956   WBC 6.6 11/15/2021 1218   RBC 4.52 12/05/2021 0956   HGB 11.7 (L) 12/05/2021 0956    HCT 36.8 (L) 12/05/2021 0956   PLT 274 12/05/2021 0956   MCV 81.4 12/05/2021 0956   MCH 25.9 (L) 12/05/2021 0956   MCHC 31.8 12/05/2021 0956   RDW 17.7 (H) 12/05/2021 0956   LYMPHSABS 1.3 12/05/2021 0956   MONOABS 0.5 12/05/2021 0956   EOSABS 0.1 12/05/2021 0956   BASOSABS 0.0 12/05/2021 0956    CMP     Component Value Date/Time   NA 135 12/05/2021 0956   K 4.9 12/05/2021 0956   CL 101 12/05/2021 0956   CO2 26 12/05/2021 0956   GLUCOSE 244 (H) 12/05/2021 0956   BUN 26 (H) 12/05/2021 0956   CREATININE 0.93 12/05/2021 0956   CALCIUM 9.9 12/05/2021 0956   PROT 7.3 12/05/2021 0956   ALBUMIN 3.9 12/05/2021 0956   AST 11 (L) 12/05/2021 0956   ALT 14 12/05/2021 0956   ALKPHOS 92 12/05/2021 0956   BILITOT 0.5 12/05/2021 0956   GFRNONAA >60 12/05/2021 0956   GFRAA >60 04/25/2019 0255        ASSESSMENT and THERAPY PLAN:   Adenocarcinoma of rectum (HCC) Steven [name] is a 51 year old man with stage IIa rectal cancer.  He underwent treatment with neoadjuvant chemoradiation, resection, adjuvant Xeloda,  1.  Stage IIa rectal cancer.  He will continue on Xeloda twice daily.  He is tolerating this well.  2.  Fatigue: We talked about methods to ameliorate the level of fatigue he experiences with short walks.  We will see Steven [name] back for labs, and follow-up with Dr. Chryl Heck in 4 weeks.    All questions were answered. The patient knows to call the clinic with any problems, questions or concerns. We can certainly see the patient much sooner if necessary.  Total encounter time: 30 minutes in face-to-face visit time, chart review, lab review, care coordination, order entry, and documentation of the encounter.  Steven Bihari, NP 12/05/21 11:13 AM Medical Oncology and Hematology [facility] [place], [place] [address] Tel. [phone]    Fax. [phone]  *Total Encounter Time as defined by the  Centers for Medicare and Medicaid Services includes,  in addition to the face-to-face time of a patient visit (documented in the note above) non-face-to-face time: obtaining and reviewing outside history, ordering and reviewing medications, tests or procedures, care coordination (communications with other health care professionals or caregivers) and documentation in the medical record.

## 2021-12-09 ENCOUNTER — Other Ambulatory Visit: Payer: Self-pay

## 2021-12-09 MED ORDER — CAPECITABINE 500 MG PO TABS: 1000.0000 mg/m2 | ORAL_TABLET | ORAL | 0 refills | Status: DC

## 2021-12-09 NOTE — Telephone Encounter (Signed)
Pt called and requested refill on Capecitabine. Pt states he will need 129 tabs to get him through this last cycle. Filled per Wilber Bihari, NP. Pt is aware and verbalized thanks.  ?

## 2021-12-10 ENCOUNTER — Telehealth: Payer: Self-pay

## 2021-12-10 ENCOUNTER — Encounter: Payer: Self-pay | Admitting: Hematology and Oncology

## 2021-12-10 NOTE — Telephone Encounter (Signed)
Spoke with pt regarding [employer]. I called [facility] phx to find out why this medication was not filled, asn we show a phx receipt for 3/13 at 1416. [facility] was unable to give explanation as to why this was not fulfilled in time. They will expedite to make sure pt has medication overnight ? ?

## 2021-12-12 DIAGNOSIS — E1165 Type 2 diabetes mellitus with hyperglycemia: Secondary | ICD-10-CM | POA: Diagnosis not present

## 2021-12-12 DIAGNOSIS — E7800 Pure hypercholesterolemia, unspecified: Secondary | ICD-10-CM | POA: Diagnosis not present

## 2021-12-12 DIAGNOSIS — I10 Essential (primary) hypertension: Secondary | ICD-10-CM | POA: Diagnosis not present

## 2021-12-12 DIAGNOSIS — N5202 Corporo-venous occlusive erectile dysfunction: Secondary | ICD-10-CM | POA: Diagnosis not present

## 2022-01-01 ENCOUNTER — Other Ambulatory Visit: Payer: Self-pay | Admitting: *Deleted

## 2022-01-01 MED ORDER — CAPECITABINE 500 MG PO TABS: 1000.0000 mg/m2 | ORAL_TABLET | ORAL | 0 refills | Status: DC

## 2022-01-02 ENCOUNTER — Other Ambulatory Visit: Payer: Self-pay | Admitting: *Deleted

## 2022-01-03 ENCOUNTER — Other Ambulatory Visit: Payer: Self-pay

## 2022-01-03 ENCOUNTER — Inpatient Hospital Stay (HOSPITAL_BASED_OUTPATIENT_CLINIC_OR_DEPARTMENT_OTHER): Payer: BC Managed Care – PPO | Admitting: Hematology and Oncology

## 2022-01-03 ENCOUNTER — Inpatient Hospital Stay: Payer: BC Managed Care – PPO | Attending: Hematology and Oncology

## 2022-01-03 ENCOUNTER — Encounter: Payer: Self-pay | Admitting: Hematology and Oncology

## 2022-01-03 DIAGNOSIS — Z85048 Personal history of other malignant neoplasm of rectum, rectosigmoid junction, and anus: Secondary | ICD-10-CM | POA: Insufficient documentation

## 2022-01-03 DIAGNOSIS — C20 Malignant neoplasm of rectum: Secondary | ICD-10-CM

## 2022-01-03 DIAGNOSIS — Z9221 Personal history of antineoplastic chemotherapy: Secondary | ICD-10-CM | POA: Insufficient documentation

## 2022-01-03 DIAGNOSIS — Z923 Personal history of irradiation: Secondary | ICD-10-CM | POA: Diagnosis not present

## 2022-01-03 LAB — CBC WITH DIFFERENTIAL/PLATELET
Abs Immature Granulocytes: 0.02 10*3/uL (ref 0.00–0.07)
Basophils Absolute: 0 10*3/uL (ref 0.0–0.1)
Basophils Relative: 0 %
Eosinophils Absolute: 0.1 10*3/uL (ref 0.0–0.5)
Eosinophils Relative: 2 %
HCT: 35.3 % — ABNORMAL LOW (ref 39.0–52.0)
Hemoglobin: 10.9 g/dL — ABNORMAL LOW (ref 13.0–17.0)
Immature Granulocytes: 0 %
Lymphocytes Relative: 21 %
Lymphs Abs: 1.4 10*3/uL (ref 0.7–4.0)
MCH: 25.1 pg — ABNORMAL LOW (ref 26.0–34.0)
MCHC: 30.9 g/dL (ref 30.0–36.0)
MCV: 81.1 fL (ref 80.0–100.0)
Monocytes Absolute: 0.6 10*3/uL (ref 0.1–1.0)
Monocytes Relative: 8 %
Neutro Abs: 4.6 10*3/uL (ref 1.7–7.7)
Neutrophils Relative %: 69 %
Platelets: 332 10*3/uL (ref 150–400)
RBC: 4.35 MIL/uL (ref 4.22–5.81)
RDW: 17.3 % — ABNORMAL HIGH (ref 11.5–15.5)
WBC: 6.7 10*3/uL (ref 4.0–10.5)
nRBC: 0 % (ref 0.0–0.2)

## 2022-01-03 LAB — COMPREHENSIVE METABOLIC PANEL
ALT: 9 U/L (ref 0–44)
AST: 9 U/L — ABNORMAL LOW (ref 15–41)
Albumin: 3.7 g/dL (ref 3.5–5.0)
Alkaline Phosphatase: 93 U/L (ref 38–126)
Anion gap: 11 (ref 5–15)
BUN: 18 mg/dL (ref 6–20)
CO2: 23 mmol/L (ref 22–32)
Calcium: 9.6 mg/dL (ref 8.9–10.3)
Chloride: 102 mmol/L (ref 98–111)
Creatinine, Ser: 0.97 mg/dL (ref 0.61–1.24)
GFR, Estimated: >60 mL/min (ref >60)
Glucose, Bld: 247 mg/dL — ABNORMAL HIGH (ref 70–99)
Potassium: 4.4 mmol/L (ref 3.5–5.1)
Sodium: 136 mmol/L (ref 135–145)
Total Bilirubin: 0.5 mg/dL (ref 0.3–1.2)
Total Protein: 7.5 g/dL (ref 6.5–8.1)

## 2022-01-03 NOTE — Progress Notes (Signed)
[facility] Follow up: ?  ? ?Steven Frees, MD ?[address] ?[place] [address] ? ? ?DIAGNOSIS:  Cancer Staging  ?Adenocarcinoma of rectum ([place]) ?Staging form: Colon and Rectum, AJCC 8th Edition ?- Clinical stage from 03/05/2021: Stage IIA (cT3, cN0, cM0) - Signed by Truitt Merle, MD on 07/29/2021 ?Stage prefix: Initial diagnosis ?Total positive nodes: 0 ?- Pathologic stage from 07/17/2021: ypT0, pN0, cM0 - Signed by Truitt Merle, MD on 07/29/2021 ?Stage prefix: Post-therapy ?Total positive nodes: 0 ?Residual tumor (R): R0 - None ? ? ?SUMMARY OF ONCOLOGIC HISTORY: ?He underwent CT AP 11/23/20 to rule out malignancy after PE. This showed asymmetric soft tissue in rectum. ?-colonoscopy 03/05/21 with Dr. Rush Landmark revealed a palpable rectal mass on digital rectal exam, measuring 5 cm on scope. Pathology confirmed adenocarcinoma. ?-staging CT CAP 03/14/21 showed questionable tiny perirectal lymph nodes, but negative node on staging pelvic MRI 03/15/21. ?-he received neoadjuvant concurrent chemoradiation 04/03/21-05/10/21. ?-definitive resection on 07/17/21 under Dr. Marcello Moores. Pathology showed complete response with no residual carcinoma. Lymph nodes and margins negative. ?-He saw Dr. Marcello Moores on 08/19/21 for increasing postoperative rectal pain and was found to have some anastomotic dehiscence. Because of the slow healing and lack of improvement from antibiotics, there was delay in starting Xeloda and he was finally able to start Xeloda on 09/09/2021.   ?He completed 6 cycles of adjuvant xeloda. ? ?CURRENT THERAPY: Xeloda ? ?INTERVAL HISTORY: ?Steven [name] 51 y.o. male returns for evaluation of his history of rectal cancer.   ?He states his last cycle was his worst.  He had some altered sensation in his feet not necessarily pain.  He had some random aches in his legs.  Continues to have discharge from his bottom, he has an area of dehiscence today which is very painful.  He is hoping that his  ostomy can be reversed soon once he finishes chemotherapy.  He denies any nausea or vomiting or diarrhea.  No rash of his hands or his feet. ?He already talked to Dr. Rush Landmark about his follow-up colonoscopy. ?Rest of the pertinent 10 point ROS reviewed and negative. ? ?Patient Active Problem List  ? Diagnosis Date Noted  ? Cancer related pain 04/30/2021  ? Tenesmus 04/16/2021  ? Chemotherapy-induced nausea 04/03/2021  ? Adenocarcinoma of rectum ([place]) 03/21/2021  ? Colon cancer screening 12/26/2020  ? Abnormal finding on CT scan 12/26/2020  ? History of rectal bleeding 12/26/2020  ? Gastroesophageal reflux disease 12/26/2020  ? Diabetes mellitus due to underlying condition, uncontrolled, with hyperglycemia, without long-term current use of insulin ([place]) 04/26/2019  ? Obesity (BMI 30-39.9) 04/26/2019  ? Asthma   ? Anxiety   ? OSA (obstructive sleep apnea)   ? HLD (hyperlipidemia)   ? Bilateral pulmonary embolism ([place]) 04/24/2019  ? ? ?is allergic to contrast media [iodinated contrast media]. ? ?MEDICAL HISTORY: ?Past Medical History:  ?Diagnosis Date  ? Anemia 02/2021  ? iron  ? Anxiety   ? Asthma   ? Cancer ([place]) 02/2021  ? Depression   ? Diabetes mellitus without complication ([place])   ? History of kidney stones   ? HLD (hyperlipidemia)   ? Hypertension   ? Kidney stones   ? Neuromuscular disorder ([place])   ? from radiation bi lat  ? OSA (obstructive sleep apnea)   ? CPAP  ? Peripheral vascular disease ([place])   ? DVT  ? ? ?SURGICAL HISTORY: ?Past Surgical History:  ?Procedure Laterality Date  ? APPENDECTOMY  1989  ?  ag  ? COLONOSCOPY  03/05/2021  ? CYST EXCISION  2011  ? left neck  ? DIVERTING ILEOSTOMY N/A 07/17/2021  ? Procedure: DIVERTING ILEOSTOMY;  Surgeon: [name], MD;  Location: [facility];  Service: General;  Laterality: N/A;  ? WISDOM TOOTH EXTRACTION    ? XI ROBOTIC ASSISTED LOWER ANTERIOR RESECTION N/A 07/17/2021  ? Procedure: XI ROBOTIC ASSISTED LOWER ANTERIOR RESECTION;  Surgeon: [name], MD;   Location: [facility];  Service: General;  Laterality: N/A;  ? ? ?SOCIAL HISTORY: ?Social History  ? ?Socioeconomic History  ? Marital status: Married  ?  Spouse name: Not on file  ? Number of children: 3  ? Years of education: Not on file  ? Highest education level: Not on file  ?Occupational History  ? Occupation: Gaffer  ?Tobacco Use  ? Smoking status: Never  ? Smokeless tobacco: Never  ?Vaping Use  ? Vaping Use: Never used  ?Substance and Sexual Activity  ? Alcohol use: Yes  ?  Comment: 1 beer every 3-4 months  ? Drug use: Never  ? Sexual activity: Not on file  ?Other Topics Concern  ? Not on file  ?Social History Narrative  ? Not on file  ? ?Social Determinants of Health  ? ?Financial Resource Strain: Not on file  ?Food Insecurity: Not on file  ?Transportation Needs: Not on file  ?Physical Activity: Not on file  ?Stress: Not on file  ?Social Connections: Not on file  ?Intimate Partner Violence: Not on file  ? ? ?FAMILY HISTORY: ?Family History  ?Problem Relation Age of Onset  ? Diabetes Mellitus II Mother   ? Breast cancer Mother 34  ? Thrombophlebitis Father   ? Breast cancer Maternal Grandmother 68  ? Diabetes Maternal Grandmother   ? Breast cancer Maternal Aunt 60  ? Diabetes Maternal Aunt   ? Hypertension Paternal Grandmother   ? Colon cancer Neg Hx   ? Esophageal cancer Neg Hx   ? Inflammatory bowel disease Neg Hx   ? Liver disease Neg Hx   ? Pancreatic cancer Neg Hx   ? Rectal cancer Neg Hx   ? Stomach cancer Neg Hx   ? ? ?Review of Systems  ?Constitutional:  Positive for fatigue. Negative for appetite change, chills, fever and unexpected weight change.  ?HENT:   Negative for hearing loss, lump/mass and trouble swallowing.   ?Eyes:  Negative for eye problems and icterus.  ?Respiratory:  Negative for chest tightness, cough and shortness of breath.   ?Cardiovascular:  Negative for chest pain, leg swelling and palpitations.  ?Gastrointestinal:  Negative for abdominal distention, abdominal pain,  constipation, diarrhea, nausea and vomiting.  ?Endocrine: Negative for hot flashes.  ?Genitourinary:  Negative for difficulty urinating.   ?Musculoskeletal:  Negative for arthralgias.  ?Skin:  Negative for itching and rash.  ?Neurological:  Negative for dizziness, extremity weakness, headaches and numbness.  ?Hematological:  Negative for adenopathy. Does not bruise/bleed easily.  ?Psychiatric/Behavioral:  Negative for depression. The patient is not nervous/anxious.    ? ? ?PHYSICAL EXAMINATION ? ?ECOG PERFORMANCE STATUS: 1 - Symptomatic but completely ambulatory ? ?Vitals:  ? 01/03/22 1303  ?BP: 134/71  ?Pulse: 95  ?Resp: 16  ?Temp: 98.1 ?F (36.7 ?C)  ?SpO2: 100%  ? ? ?Physical Exam ?Constitutional:   ?   General: He is not in acute distress. ?   Appearance: Normal appearance. He is not toxic-appearing.  ?HENT:  ?   Head: Normocephalic and atraumatic.  ?Eyes:  ?  General: No scleral icterus. ?Cardiovascular:  ?   Rate and Rhythm: Normal rate and regular rhythm.  ?   Pulses: Normal pulses.  ?   Heart sounds: Normal heart sounds.  ?Pulmonary:  ?   Effort: Pulmonary effort is normal.  ?   Breath sounds: Normal breath sounds.  ?Abdominal:  ?   General: Abdomen is flat. Bowel sounds are normal. There is no distension.  ?   Palpations: Abdomen is soft.  ?   Tenderness: There is no abdominal tenderness.  ?Musculoskeletal:     ?   General: No swelling.  ?   Cervical back: Neck supple.  ?Lymphadenopathy:  ?   Cervical: No cervical adenopathy.  ?Skin: ?   General: Skin is warm and dry.  ?   Findings: No rash.  ?Neurological:  ?   General: No focal deficit present.  ?   Mental Status: He is alert.  ?Psychiatric:     ?   Mood and Affect: Mood normal.     ?   Behavior: Behavior normal.  ? ? ?LABORATORY DATA: ? ?CBC ?   ?Component Value Date/Time  ? WBC 6.7 01/03/2022 1258  ? RBC 4.35 01/03/2022 1258  ? HGB 10.9 (L) 01/03/2022 1258  ? HGB 11.7 (L) 12/05/2021 0956  ? HCT 35.3 (L) 01/03/2022 1258  ? PLT 332 01/03/2022 1258  ? PLT  274 12/05/2021 0956  ? MCV 81.1 01/03/2022 1258  ? MCH 25.1 (L) 01/03/2022 1258  ? MCHC 30.9 01/03/2022 1258  ? RDW 17.3 (H) 01/03/2022 1258  ? LYMPHSABS 1.4 01/03/2022 1258  ? MONOABS 0.6 01/03/2022 1258  ? E

## 2022-01-03 NOTE — Assessment & Plan Note (Signed)
This is a very pleasant 51 year old male patient with newly diagnosed T3BN0 adenocarcinoma of the rectum, MSS with no evidence of distant metastasis following up with medical oncology for consideration of concurrent chemoradiation.  ? ?He has T3N0 or stage IIa adenocarcinoma of the rectum and treatment recommendations from TB suggested concurrent chemotherapy radiation with Xeloda versus 5-fluorouracil followed by consideration for surgery and adjuvant chemotherapy  ? ?Given concern for 4 mm perirectal lymph node which is not entirely diagnostic for involvement, we have also discussed total neoadjuvant approach which patient was reluctant given some issues with his work and disability leave.  ?He wanted to proceed with neoadjuvant chemoradiation followed by surgery and adjuvant chemotherapy because of some logistics with his disability leave.  He started Xeloda on 04/03/2021, last date of treatment 05/10/2021.  Treatment complicated by some mild fatigue and tenesmus otherwise no major complications. ?He had definitive resection by Dr. Marcello Moores on July 17, 2021.  Pathology showed complete response with no residual carcinoma.  He was started on adjuvant Xeloda, completed almost 6 cycles, has few more doses left. ?He has been tolerating chemotherapy very well.  He denies any new health complaints except for fatigue, some neuropathic pain in his lower extremities.  Physical examination today without any complaints. ?Overall he tolerated chemotherapy very well.  He will now proceed with surveillance.  Labs every 3 months for the first 2 years followed by [place] every 6 months for total of 5 years. ?Colonoscopy 8-year 1, 3 and 5, ?CT imaging every 6 months for the first 2 years followed by annually for total of 5 years. ?

## 2022-01-05 ENCOUNTER — Encounter: Payer: Self-pay | Admitting: Hematology and Oncology

## 2022-01-09 ENCOUNTER — Encounter: Payer: Self-pay | Admitting: Hematology and Oncology

## 2022-02-03 DIAGNOSIS — C20 Malignant neoplasm of rectum: Secondary | ICD-10-CM | POA: Diagnosis not present

## 2022-02-10 ENCOUNTER — Other Ambulatory Visit (HOSPITAL_COMMUNITY): Payer: Self-pay | Admitting: General Surgery

## 2022-02-10 ENCOUNTER — Other Ambulatory Visit: Payer: Self-pay | Admitting: General Surgery

## 2022-02-10 DIAGNOSIS — C20 Malignant neoplasm of rectum: Secondary | ICD-10-CM

## 2022-03-06 ENCOUNTER — Ambulatory Visit (HOSPITAL_COMMUNITY)
Admission: RE | Admit: 2022-03-06 | Discharge: 2022-03-06 | Disposition: A | Discharge status: Home / Self Care | Payer: BC Managed Care – PPO | Source: Ambulatory Visit | Attending: General Surgery | Admitting: General Surgery

## 2022-03-06 DIAGNOSIS — C20 Malignant neoplasm of rectum: Secondary | ICD-10-CM | POA: Diagnosis not present

## 2022-03-06 DIAGNOSIS — Z9889 Other specified postprocedural states: Secondary | ICD-10-CM | POA: Diagnosis not present

## 2022-03-06 DIAGNOSIS — K435 Parastomal hernia without obstruction or  gangrene: Secondary | ICD-10-CM | POA: Diagnosis not present

## 2022-03-06 DIAGNOSIS — N200 Calculus of kidney: Secondary | ICD-10-CM | POA: Diagnosis not present

## 2022-03-06 DIAGNOSIS — Z85048 Personal history of other malignant neoplasm of rectum, rectosigmoid junction, and anus: Secondary | ICD-10-CM | POA: Diagnosis not present

## 2022-03-06 DIAGNOSIS — R590 Localized enlarged lymph nodes: Secondary | ICD-10-CM | POA: Diagnosis not present

## 2022-03-06 LAB — POCT I-STAT CREATININE: Creatinine, Ser: 1.1 mg/dL (ref 0.61–1.24)

## 2022-03-06 MED ORDER — SODIUM CHLORIDE (PF) 0.9 % IJ SOLN
INTRAMUSCULAR | Status: AC
  Filled 2022-03-06: qty 50

## 2022-03-06 MED ORDER — IOHEXOL 300 MG/ML  SOLN
100.0000 mL | Freq: Once | INTRAMUSCULAR | Status: AC | PRN
  Administered 2022-03-06: 100 mL via INTRAVENOUS

## 2022-03-10 ENCOUNTER — Other Ambulatory Visit: Payer: Self-pay | Admitting: General Surgery

## 2022-03-11 ENCOUNTER — Telehealth: Payer: Self-pay | Admitting: *Deleted

## 2022-03-11 NOTE — Telephone Encounter (Signed)
Received faxed letter in Dr. Rob Hickman office from [facility]. Plan is to schedule an anal EUA and possible debridement in near future.  [name] is requesting  seeking Hematology Clearance for patient  - in letter, states patient currently on Xarelto.  Per Dr. Rob Hickman OV note on 01/03/22, patient was directed to stop anticoagulant therapy.  Contacted patient to verify that he has stopped Xarelto.  Patient states his last dose of Xarelto was Sunday, January 05, 2022.   Called [name] [phone]. Gave information to [place].

## 2022-03-27 ENCOUNTER — Encounter (HOSPITAL_BASED_OUTPATIENT_CLINIC_OR_DEPARTMENT_OTHER): Payer: Self-pay | Admitting: General Surgery

## 2022-03-27 NOTE — Progress Notes (Signed)
Spoke w/ via phone for pre-op interview--- Steven [name] Lab needs dos---- CBG              Lab results------Current EKG in Epic dated 06/2021 COVID test -----patient states asymptomatic no test needed Arrive at -------0830 NPO after MN NO Solid Food.  Clear liquids from MN until---0730 Med rec completed Medications to take morning of surgery -----Xanax PRN and bring Albuterol inhaler. Diabetic medication ----- None day of surgery. Patient instructed no nail polish to be worn day of surgery Patient instructed to bring photo id and insurance card day of surgery Patient aware to have Driver (ride ) / caregiver  [name]   for 24 hours after surgery  Patient Special Instructions ----- Bring CPAP mask and tubing day of surgery. Pre-Op special Istructions ----- Patient verbalized understanding of instructions that were given at this phone interview. Patient denies shortness of breath, chest pain, fever, cough at this phone interview.

## 2022-04-03 NOTE — Progress Notes (Signed)
Called and talked with pt. To come in at 0700 instead of 0830. Clear liquids until 0600

## 2022-04-04 ENCOUNTER — Other Ambulatory Visit: Payer: Self-pay

## 2022-04-04 ENCOUNTER — Encounter (HOSPITAL_BASED_OUTPATIENT_CLINIC_OR_DEPARTMENT_OTHER): Payer: Self-pay | Admitting: General Surgery

## 2022-04-04 ENCOUNTER — Ambulatory Visit (HOSPITAL_BASED_OUTPATIENT_CLINIC_OR_DEPARTMENT_OTHER): Payer: BC Managed Care – PPO | Admitting: Anesthesiology

## 2022-04-04 ENCOUNTER — Encounter (HOSPITAL_BASED_OUTPATIENT_CLINIC_OR_DEPARTMENT_OTHER)
Admission: RE | Disposition: A | Discharge status: Home / Self Care | Payer: Self-pay | Source: Home / Self Care | Attending: General Surgery

## 2022-04-04 ENCOUNTER — Ambulatory Visit (HOSPITAL_BASED_OUTPATIENT_CLINIC_OR_DEPARTMENT_OTHER)
Admission: RE | Admit: 2022-04-04 | Discharge: 2022-04-04 | Disposition: A | Discharge status: Home / Self Care | Payer: BC Managed Care – PPO | Attending: General Surgery | Admitting: General Surgery

## 2022-04-04 ENCOUNTER — Encounter: Payer: Self-pay | Admitting: *Deleted

## 2022-04-04 DIAGNOSIS — K9189 Other postprocedural complications and disorders of digestive system: Secondary | ICD-10-CM | POA: Insufficient documentation

## 2022-04-04 DIAGNOSIS — X58XXXA Exposure to other specified factors, initial encounter: Secondary | ICD-10-CM | POA: Diagnosis not present

## 2022-04-04 DIAGNOSIS — E0865 Diabetes mellitus due to underlying condition with hyperglycemia: Secondary | ICD-10-CM

## 2022-04-04 DIAGNOSIS — Z980 Intestinal bypass and anastomosis status: Secondary | ICD-10-CM | POA: Insufficient documentation

## 2022-04-04 DIAGNOSIS — E1151 Type 2 diabetes mellitus with diabetic peripheral angiopathy without gangrene: Secondary | ICD-10-CM | POA: Diagnosis not present

## 2022-04-04 DIAGNOSIS — T8132XA Disruption of internal operation (surgical) wound, not elsewhere classified, initial encounter: Secondary | ICD-10-CM | POA: Diagnosis not present

## 2022-04-04 DIAGNOSIS — I10 Essential (primary) hypertension: Secondary | ICD-10-CM | POA: Diagnosis not present

## 2022-04-04 DIAGNOSIS — C20 Malignant neoplasm of rectum: Secondary | ICD-10-CM | POA: Diagnosis not present

## 2022-04-04 DIAGNOSIS — F418 Other specified anxiety disorders: Secondary | ICD-10-CM | POA: Diagnosis not present

## 2022-04-04 HISTORY — PX: FLEXIBLE SIGMOIDOSCOPY: SHX5431

## 2022-04-04 HISTORY — PX: RECTAL EXAM UNDER ANESTHESIA: SHX6399

## 2022-04-04 LAB — GLUCOSE, CAPILLARY
Glucose-Capillary: 223 mg/dL — ABNORMAL HIGH (ref 70–99)
Glucose-Capillary: 236 mg/dL — ABNORMAL HIGH (ref 70–99)

## 2022-04-04 SURGERY — EXAM UNDER ANESTHESIA, RECTUM
Anesthesia: Monitor Anesthesia Care | Site: Rectum

## 2022-04-04 MED ORDER — ONDANSETRON HCL 4 MG/2ML IJ SOLN: 4.0000 mg | Freq: Once | INTRAMUSCULAR | Status: DC | PRN

## 2022-04-04 MED ORDER — BUPIVACAINE-EPINEPHRINE 0.5% -1:200000 IJ SOLN
INTRAMUSCULAR | Status: DC | PRN
  Administered 2022-04-04: 30 mL

## 2022-04-04 MED ORDER — CEFAZOLIN SODIUM-DEXTROSE 2-4 GM/100ML-% IV SOLN
2.0000 g | INTRAVENOUS | Status: AC
  Administered 2022-04-04: 2 g via INTRAVENOUS

## 2022-04-04 MED ORDER — PROPOFOL 500 MG/50ML IV EMUL
INTRAVENOUS | Status: DC | PRN
  Administered 2022-04-04: 200 ug/kg/min via INTRAVENOUS

## 2022-04-04 MED ORDER — ONDANSETRON HCL 4 MG/2ML IJ SOLN
INTRAMUSCULAR | Status: DC | PRN
  Administered 2022-04-04: 4 mg via INTRAVENOUS

## 2022-04-04 MED ORDER — FENTANYL CITRATE (PF) 100 MCG/2ML IJ SOLN
INTRAMUSCULAR | Status: DC | PRN
  Administered 2022-04-04: 50 ug via INTRAVENOUS

## 2022-04-04 MED ORDER — FENTANYL CITRATE (PF) 100 MCG/2ML IJ SOLN
INTRAMUSCULAR | Status: AC
  Filled 2022-04-04: qty 2

## 2022-04-04 MED ORDER — LIDOCAINE HCL (CARDIAC) PF 100 MG/5ML IV SOSY
PREFILLED_SYRINGE | INTRAVENOUS | Status: DC | PRN
  Administered 2022-04-04: 30 mg via INTRAVENOUS

## 2022-04-04 MED ORDER — CEFAZOLIN SODIUM-DEXTROSE 2-4 GM/100ML-% IV SOLN
INTRAVENOUS | Status: AC
  Filled 2022-04-04: qty 100

## 2022-04-04 MED ORDER — ACETAMINOPHEN 500 MG PO TABS
ORAL_TABLET | ORAL | Status: AC
  Filled 2022-04-04: qty 2

## 2022-04-04 MED ORDER — LACTATED RINGERS IV SOLN: INTRAVENOUS | Status: DC

## 2022-04-04 MED ORDER — BUPIVACAINE LIPOSOME 1.3 % IJ SUSP: INTRAMUSCULAR | Status: DC | PRN

## 2022-04-04 MED ORDER — STERILE WATER FOR IRRIGATION IR SOLN
Status: DC | PRN
  Administered 2022-04-04: 500 mL

## 2022-04-04 MED ORDER — OXYCODONE HCL 5 MG PO TABS: 5.0000 mg | ORAL_TABLET | Freq: Four times a day (QID) | ORAL | 0 refills | Status: DC | PRN

## 2022-04-04 MED ORDER — MIDAZOLAM HCL 2 MG/2ML IJ SOLN
INTRAMUSCULAR | Status: AC
  Filled 2022-04-04: qty 2

## 2022-04-04 MED ORDER — OXYCODONE HCL 5 MG PO TABS: 5.0000 mg | ORAL_TABLET | Freq: Once | ORAL | Status: DC | PRN

## 2022-04-04 MED ORDER — 0.9 % SODIUM CHLORIDE (POUR BTL) OPTIME
TOPICAL | Status: DC | PRN
  Administered 2022-04-04: 1000 mL
  Administered 2022-04-04: 500 mL

## 2022-04-04 MED ORDER — ACETAMINOPHEN 500 MG PO TABS
1000.0000 mg | ORAL_TABLET | Freq: Once | ORAL | Status: AC
  Administered 2022-04-04: 1000 mg via ORAL

## 2022-04-04 MED ORDER — FENTANYL CITRATE (PF) 100 MCG/2ML IJ SOLN: 25.0000 ug | INTRAMUSCULAR | Status: DC | PRN

## 2022-04-04 MED ORDER — AMISULPRIDE (ANTIEMETIC) 5 MG/2ML IV SOLN: 10.0000 mg | Freq: Once | INTRAVENOUS | Status: DC | PRN

## 2022-04-04 MED ORDER — OXYCODONE HCL 5 MG/5ML PO SOLN: 5.0000 mg | Freq: Once | ORAL | Status: DC | PRN

## 2022-04-04 MED ORDER — MIDAZOLAM HCL 2 MG/2ML IJ SOLN
INTRAMUSCULAR | Status: DC | PRN
  Administered 2022-04-04: 2 mg via INTRAVENOUS

## 2022-04-04 SURGICAL SUPPLY — 48 items
APL SKNCLS STERI-STRIP NONHPOA (GAUZE/BANDAGES/DRESSINGS) ×1
BENZOIN TINCTURE PRP APPL 2/3 (GAUZE/BANDAGES/DRESSINGS) ×3 IMPLANT
BLADE EXTENDED COATED 6.5IN (ELECTRODE) ×1 IMPLANT
BLADE SURG 15 STRL LF DISP TIS (BLADE) IMPLANT
BLADE SURG 15 STRL SS (BLADE) ×2
COVER BACK TABLE 60X90IN (DRAPES) ×2 IMPLANT
COVER MAYO STAND STRL (DRAPES) ×2 IMPLANT
DECANTER SPIKE VIAL GLASS SM (MISCELLANEOUS) ×2 IMPLANT
DRAPE LAPAROTOMY 100X72 PEDS (DRAPES) ×2 IMPLANT
DRAPE UTILITY XL STRL (DRAPES) ×2 IMPLANT
DRSG PAD ABDOMINAL 8X10 ST (GAUZE/BANDAGES/DRESSINGS) ×2 IMPLANT
ELECT REM PT RETURN 9FT ADLT (ELECTROSURGICAL) ×2
ELECTRODE REM PT RTRN 9FT ADLT (ELECTROSURGICAL) ×1 IMPLANT
GAUZE 4X4 16PLY ~~LOC~~+RFID DBL (SPONGE) ×2 IMPLANT
GAUZE SPONGE 4X4 12PLY STRL (GAUZE/BANDAGES/DRESSINGS) ×2 IMPLANT
GLOVE BIO SURGEON STRL SZ 6 (GLOVE) ×2 IMPLANT
GLOVE BIO SURGEON STRL SZ 6.5 (GLOVE) ×2 IMPLANT
GLOVE BIO SURGEON STRL SZ8 (GLOVE) ×1 IMPLANT
GLOVE BIOGEL PI IND STRL 6 (GLOVE) IMPLANT
GLOVE BIOGEL PI IND STRL 6.5 (GLOVE) IMPLANT
GLOVE BIOGEL PI IND STRL 7.0 (GLOVE) ×1 IMPLANT
GLOVE BIOGEL PI IND STRL 8 (GLOVE) IMPLANT
GLOVE BIOGEL PI INDICATOR 6 (GLOVE) ×2
GLOVE BIOGEL PI INDICATOR 6.5 (GLOVE) ×1
GLOVE BIOGEL PI INDICATOR 7.0 (GLOVE) ×2
GLOVE BIOGEL PI INDICATOR 8 (GLOVE) ×1
GLOVE INDICATOR 6.5 STRL GRN (GLOVE) ×2 IMPLANT
GOWN STRL REUS W/TWL 2XL LVL3 (GOWN DISPOSABLE) ×1 IMPLANT
GOWN STRL REUS W/TWL LRG LVL3 (GOWN DISPOSABLE) ×2 IMPLANT
GOWN STRL REUS W/TWL XL LVL3 (GOWN DISPOSABLE) ×2 IMPLANT
HYDROGEN PEROXIDE 16OZ (MISCELLANEOUS) ×1 IMPLANT
IV CATH 14GX2 1/4 (CATHETERS) ×2 IMPLANT
IV CATH 18G SAFETY (IV SOLUTION) ×2 IMPLANT
KIT TURNOVER CYSTO (KITS) ×2 IMPLANT
NEEDLE HYPO 22GX1.5 SAFETY (NEEDLE) ×2 IMPLANT
NS IRRIG 1000ML POUR BTL (IV SOLUTION) ×1 IMPLANT
NS IRRIG 500ML POUR BTL (IV SOLUTION) ×2 IMPLANT
PACK BASIN DAY SURGERY FS (CUSTOM PROCEDURE TRAY) ×2 IMPLANT
PANTS MESH DISP LRG (UNDERPADS AND DIAPERS) ×2 IMPLANT
PENCIL SMOKE EVACUATOR (MISCELLANEOUS) ×2 IMPLANT
SOL PREP POV-IOD 4OZ 10% (MISCELLANEOUS) ×1 IMPLANT
SYR BULB IRRIG 60ML STRL (SYRINGE) ×1 IMPLANT
SYR CONTROL 10ML LL (SYRINGE) ×2 IMPLANT
TOWEL OR 17X26 10 PK STRL BLUE (TOWEL DISPOSABLE) ×2 IMPLANT
TRAY DSU PREP LF (CUSTOM PROCEDURE TRAY) ×2 IMPLANT
TUBE CONNECTING 12X1/4 (SUCTIONS) ×3 IMPLANT
WATER STERILE IRR 500ML POUR (IV SOLUTION) ×1 IMPLANT
YANKAUER SUCT BULB TIP NO VENT (SUCTIONS) ×2 IMPLANT

## 2022-04-04 NOTE — H&P (Signed)
CC: anastomotic dehiscence   HPI: Steven [name] is an 51 y.o. male who is here for EUA.    Past Medical History:  Diagnosis Date   Anemia 02/2021   iron   Anxiety    Asthma    Cancer ([place]) 02/2021   Depression    Diabetes mellitus without complication ([place])    History of kidney stones    HLD (hyperlipidemia)    Hypertension    Kidney stones    Neuromuscular disorder (HCC)    from radiation bi lat   OSA (obstructive sleep apnea)    CPAP   Peripheral vascular disease ([place])    DVT    Past Surgical History:  Procedure Laterality Date   APPENDECTOMY  1989   ag   COLONOSCOPY  03/05/2021   CYST EXCISION  2011   left neck   DIVERTING ILEOSTOMY N/A 07/17/2021   Procedure: DIVERTING ILEOSTOMY;  Surgeon: [name], MD;  Location: [facility];  Service: General;  Laterality: N/A;   WISDOM TOOTH EXTRACTION     XI ROBOTIC ASSISTED LOWER ANTERIOR RESECTION N/A 07/17/2021   Procedure: XI ROBOTIC ASSISTED LOWER ANTERIOR RESECTION;  Surgeon: [name], MD;  Location: [facility];  Service: General;  Laterality: N/A;    Family History  Problem Relation Age of Onset   Diabetes Mellitus II Mother    Breast cancer Mother 6   Thrombophlebitis Father    Breast cancer Maternal Grandmother 70   Diabetes Maternal Grandmother    Breast cancer Maternal Aunt 60   Diabetes Maternal Aunt    Hypertension Paternal Grandmother    Colon cancer Neg Hx    Esophageal cancer Neg Hx    Inflammatory bowel disease Neg Hx    Liver disease Neg Hx    Pancreatic cancer Neg Hx    Rectal cancer Neg Hx    Stomach cancer Neg Hx     Social:  reports that he has never smoked. He has never used smokeless tobacco. He reports current alcohol use. He reports that he does not use drugs.  Allergies:  Allergies  Allergen Reactions   Contrast Media [Iodinated Contrast Media] Hives    As a 51 year old experienced hives and nausea    Medications: I have reviewed the patient's current medications.  No  results found for this or any previous visit (from the past 48 hour(s)).  No results found.  ROS - all of the below systems have been reviewed with the patient and positives are indicated with bold text General: chills, fever or night sweats Eyes: blurry vision or double vision ENT: epistaxis or sore throat Allergy/Immunology: itchy/watery eyes or nasal congestion Hematologic/Lymphatic: bleeding problems, blood clots or swollen lymph nodes Endocrine: temperature intolerance or unexpected weight changes Breast: new or changing breast lumps or nipple discharge Resp: cough, shortness of breath, or wheezing CV: chest pain or dyspnea on exertion GI: as per HPI GU: dysuria, trouble voiding, or hematuria MSK: joint pain or joint stiffness Neuro: TIA or stroke symptoms Derm: pruritus and skin lesion changes Psych: anxiety and depression  PE Blood pressure 140/80, pulse (!) 101, temperature (!) 97.5 F (36.4 C), temperature source Oral, resp. rate 16, height '5\' 10"'$  (1.778 m), weight 83.6 kg, SpO2 100 %. Constitutional: NAD; conversant; no deformities Eyes: Moist conjunctiva; no lid lag; anicteric; PERRL Neck: Trachea midline; no thyromegaly Lungs: Normal respiratory effort; no tactile fremitus CV: RRR; no palpable thrills; no pitting edema GI: Abd soft; no palpable hepatosplenomegaly MSK: Normal range of  motion of extremities; no clubbing/cyanosis Psychiatric: Appropriate affect; alert and oriented x3 Lymphatic: No palpable cervical or axillary lymphadenopathy    A/P: Steven [name] is a 51 y.o. male with anastomotic breakdown s/p coloanal anastomosis.  He had an anterior dehiscence that has not healed on it's own.  There is now a fluid collection behind this.  I have recommended an EUA with possible debridement.  Risks include bleeding, pain, recurrence and need for additional procedures.    Rosario Adie, MD  Colorectal and [facility]

## 2022-04-04 NOTE — Transfer of Care (Signed)
Immediate Anesthesia Transfer of Care Note  Patient: Branch Pacitti  Procedure(s) Performed: ANAL EXAM UNDER ANESTHESIA (Rectum) FLEXIBLE SIGMOIDOSCOPY WITH POSSIBLE DEBRIDEMENT (Rectum)  Patient Location: PACU  Anesthesia Type:MAC  Level of Consciousness: drowsy  Airway & Oxygen Therapy: Patient Spontanous Breathing and Patient connected to face mask oxygen  Post-op Assessment: Report given to RN and Post -op Vital signs reviewed and stable  Post vital signs: Reviewed and stable  Last Vitals:  Vitals Value Taken Time  BP    Temp    Pulse 100 04/04/22 1146  Resp 14 04/04/22 1146  SpO2 100 % 04/04/22 1146  Vitals shown include unvalidated device data.  Last Pain:  Vitals:   04/04/22 0719  TempSrc: Oral  PainSc: 2       Patients Stated Pain Goal: 2 (05/13/47 1856)  Complications: No notable events documented.

## 2022-04-04 NOTE — Op Note (Signed)
04/04/2022  11:38 AM  PATIENT:  Steven [name]  51 y.o. male  Patient Care Team: Shirline Frees, MD as PCP - General (Family Medicine) [name], MD as Consulting Physician (Hematology and Oncology)  PRE-OPERATIVE DIAGNOSIS:  RECTAL CANCER  POST-OPERATIVE DIAGNOSIS:  RECTAL CANCER  PROCEDURE:  ANAL EXAM UNDER ANESTHESIA FLEXIBLE SIGMOIDOSCOPY WITH DEBRIDEMENT of ANTERIOR STAPLE LINE   Surgeon(s): [name], MD  ASSISTANT: Clerance Lav, MD   ANESTHESIA:   local and MAC  SPECIMEN:  No Specimen  DISPOSITION OF SPECIMEN:  N/A  COUNTS:  YES  PLAN OF CARE: Discharge to home after PACU  PATIENT DISPOSITION:  PACU - hemodynamically stable.  INDICATION: 51 y.o. M with anastomotic dehiscence s/p distal colorectal anastomosis    OR FINDINGS: R and L anterior fistula communicating at midline  DESCRIPTION: the patient was identified in the preoperative holding area and taken to the OR where they were laid on the operating room table.   anesthesia was induced without difficulty. The patient was then positioned in prone jackknife position with buttocks gently taped apart.  The patient was then prepped and draped in usual sterile fashion.  SCDs were noted to be in place prior to the initiation of anesthesia. A surgical timeout was performed indicating the correct patient, procedure, positioning and need for preoperative antibiotics.  A rectal block was performed using Marcaine with epinephrine.    I began with a digital rectal exam.  The patient had a right anterior defect which was approximately 1 cm.  I then placed a flexible sigmoidoscope into the anal canal and evaluated this completely.  There was a smaller 5 mm defect at left anterior, both of these were draining purulence.  The remaining portion of the colon and anal canal appeared well perfused with minimal scarring.  I debrided the entire staple line anteriorly using electrocautery, harmonic scalpel and sharp  dissection.  The entire cavity was opened.  We reintroduced the flexible sigmoidoscope and evaluated this.  It appeared to be completely open and debrided.  Hemostasis was adequate.  The patient was then awakened from anesthesia and sent to the postanesthesia care unit in stable condition.  All counts were correct per operating room staff.I was personally present during the key and critical portions of this procedure and immediately available throughout the entire procedure, as documented in my operative note.   Rosario Adie, MD  Colorectal and [facility]

## 2022-04-04 NOTE — Anesthesia Postprocedure Evaluation (Signed)
Anesthesia Post Note  Patient: Steven [name]  Procedure(s) Performed: ANAL EXAM UNDER ANESTHESIA (Rectum) FLEXIBLE SIGMOIDOSCOPY WITH POSSIBLE DEBRIDEMENT (Rectum)     Patient location during evaluation: PACU Anesthesia Type: MAC Level of consciousness: awake and alert Pain management: pain level controlled Vital Signs Assessment: post-procedure vital signs reviewed and stable Respiratory status: spontaneous breathing, nonlabored ventilation and respiratory function stable Cardiovascular status: blood pressure returned to baseline and stable Postop Assessment: no apparent nausea or vomiting Anesthetic complications: no   No notable events documented.  Last Vitals:  Vitals:   04/04/22 1215 04/04/22 1302  BP: 98/64 112/69  Pulse: 91 94  Resp: (!) 8 12  Temp: 36.4 C 36.5 C  SpO2: 100% 99%    Last Pain:  Vitals:   04/04/22 1302  TempSrc:   PainSc: 0-No pain                 Lidia Collum

## 2022-04-04 NOTE — Discharge Instructions (Addendum)
Beginning the day after surgery: ° °You may sit in a tub of warm water 2-3 times a day to relieve discomfort. ° °Eat a regular diet high in fiber.  Avoid foods that give you constipation or diarrhea.  Avoid foods that are difficult to digest, such as seeds, nuts, corn or popcorn. ° °Do not go any longer than 2 days without a bowel movement.  You may take a dose of Milk of Magnesia if you become constipated.   ° °Drink 6-8 glasses of water daily. ° °Walking is encouraged.  Avoid strenuous activity and heavy lifting for one month after surgery.   ° °Call the office if you have any questions or concerns.  Call immediately if you develop: ° °· Excessive rectal bleeding (more than a cup or passing large clots) °· Increased discomfort °· Fever greater than 100 F °· Difficulty urinating ° ° °Post Anesthesia Home Care Instructions ° °Activity: °Get plenty of rest for the remainder of the day. A responsible individual must stay with you for 24 hours following the procedure.  °For the next 24 hours, DO NOT: °-Drive a car °-Operate machinery °-Drink alcoholic beverages °-Take any medication unless instructed by your physician °-Make any legal decisions or sign important papers. ° °Meals: °Start with liquid foods such as gelatin or soup. Progress to regular foods as tolerated. Avoid greasy, spicy, heavy foods. If nausea and/or vomiting occur, drink only clear liquids until the nausea and/or vomiting subsides. Call your physician if vomiting continues. ° °Special Instructions/Symptoms: °Your throat may feel dry or sore from the anesthesia or the breathing tube placed in your throat during surgery. If this causes discomfort, gargle with warm salt water. The discomfort should disappear within 24 hours. ° ° °

## 2022-04-04 NOTE — Anesthesia Preprocedure Evaluation (Signed)
Anesthesia Evaluation  Patient identified by MRN, date of birth, ID band Patient awake    Reviewed: Allergy & Precautions, NPO status , Patient's Chart, lab work & pertinent test results  History of Anesthesia Complications Negative for: history of anesthetic complications  Airway Mallampati: II  TM Distance: >3 FB Neck ROM: Full    Dental   Pulmonary asthma , sleep apnea and Continuous Positive Airway Pressure Ventilation , PE   Pulmonary exam normal        Cardiovascular hypertension, + Peripheral Vascular Disease  Normal cardiovascular exam     Neuro/Psych Anxiety Depression negative neurological ROS     GI/Hepatic Neg liver ROS, GERD  ,Rectal cancer s/p LAR   Endo/Other  diabetes  Renal/GU negative Renal ROS  negative genitourinary   Musculoskeletal negative musculoskeletal ROS (+)   Abdominal   Peds  Hematology negative hematology ROS (+)   Anesthesia Other Findings   Reproductive/Obstetrics                            Anesthesia Physical Anesthesia Plan  ASA: 3  Anesthesia Plan: MAC   Post-op Pain Management: Tylenol PO (pre-op)* and Toradol IV (intra-op)*   Induction: Intravenous  PONV Risk Score and Plan: 1 and Propofol infusion, TIVA and Treatment may vary due to age or medical condition  Airway Management Planned: Natural Airway, Nasal Cannula and Simple Face Mask  Additional Equipment: None  Intra-op Plan:   Post-operative Plan:   Informed Consent: I have reviewed the patients History and Physical, chart, labs and discussed the procedure including the risks, benefits and alternatives for the proposed anesthesia with the patient or authorized representative who has indicated his/her understanding and acceptance.       Plan Discussed with:   Anesthesia Plan Comments:         Anesthesia Quick Evaluation

## 2022-04-07 ENCOUNTER — Encounter (HOSPITAL_BASED_OUTPATIENT_CLINIC_OR_DEPARTMENT_OTHER): Payer: Self-pay | Admitting: General Surgery

## 2022-04-09 ENCOUNTER — Other Ambulatory Visit: Payer: Self-pay

## 2022-04-09 ENCOUNTER — Inpatient Hospital Stay: Payer: BC Managed Care – PPO

## 2022-04-09 ENCOUNTER — Inpatient Hospital Stay: Payer: BC Managed Care – PPO | Attending: Hematology and Oncology | Admitting: Nurse Practitioner

## 2022-04-09 ENCOUNTER — Encounter: Payer: Self-pay | Admitting: Nurse Practitioner

## 2022-04-09 VITALS — BP 127/73 | HR 103 | Temp 97.5°F | Resp 20 | Wt 182.6 lb

## 2022-04-09 DIAGNOSIS — C20 Malignant neoplasm of rectum: Secondary | ICD-10-CM

## 2022-04-09 DIAGNOSIS — Z923 Personal history of irradiation: Secondary | ICD-10-CM | POA: Insufficient documentation

## 2022-04-09 DIAGNOSIS — Z85048 Personal history of other malignant neoplasm of rectum, rectosigmoid junction, and anus: Secondary | ICD-10-CM | POA: Insufficient documentation

## 2022-04-09 DIAGNOSIS — Z9221 Personal history of antineoplastic chemotherapy: Secondary | ICD-10-CM | POA: Insufficient documentation

## 2022-04-09 LAB — CBC WITH DIFFERENTIAL/PLATELET
Abs Immature Granulocytes: 0.02 10*3/uL (ref 0.00–0.07)
Basophils Absolute: 0 10*3/uL (ref 0.0–0.1)
Basophils Relative: 0 %
Eosinophils Absolute: 0.2 10*3/uL (ref 0.0–0.5)
Eosinophils Relative: 3 %
HCT: 36.9 % — ABNORMAL LOW (ref 39.0–52.0)
Hemoglobin: 11.5 g/dL — ABNORMAL LOW (ref 13.0–17.0)
Immature Granulocytes: 0 %
Lymphocytes Relative: 32 %
Lymphs Abs: 2.1 10*3/uL (ref 0.7–4.0)
MCH: 22.1 pg — ABNORMAL LOW (ref 26.0–34.0)
MCHC: 31.2 g/dL (ref 30.0–36.0)
MCV: 71 fL — ABNORMAL LOW (ref 80.0–100.0)
Monocytes Absolute: 0.5 10*3/uL (ref 0.1–1.0)
Monocytes Relative: 8 %
Neutro Abs: 3.7 10*3/uL (ref 1.7–7.7)
Neutrophils Relative %: 57 %
Platelets: 355 10*3/uL (ref 150–400)
RBC: 5.2 MIL/uL (ref 4.22–5.81)
RDW: 17 % — ABNORMAL HIGH (ref 11.5–15.5)
WBC: 6.5 10*3/uL (ref 4.0–10.5)
nRBC: 0 % (ref 0.0–0.2)

## 2022-04-09 LAB — COMPREHENSIVE METABOLIC PANEL
ALT: 13 U/L (ref 0–44)
AST: 13 U/L — ABNORMAL LOW (ref 15–41)
Albumin: 3.6 g/dL (ref 3.5–5.0)
Alkaline Phosphatase: 96 U/L (ref 38–126)
Anion gap: 12 (ref 5–15)
BUN: 22 mg/dL — ABNORMAL HIGH (ref 6–20)
CO2: 21 mmol/L — ABNORMAL LOW (ref 22–32)
Calcium: 10 mg/dL (ref 8.9–10.3)
Chloride: 104 mmol/L (ref 98–111)
Creatinine, Ser: 1.1 mg/dL (ref 0.61–1.24)
GFR, Estimated: >60 mL/min (ref >60)
Glucose, Bld: 153 mg/dL — ABNORMAL HIGH (ref 70–99)
Potassium: 4.2 mmol/L (ref 3.5–5.1)
Sodium: 137 mmol/L (ref 135–145)
Total Bilirubin: 0.8 mg/dL (ref 0.3–1.2)
Total Protein: 7.8 g/dL (ref 6.5–8.1)

## 2022-04-09 LAB — CEA (IN HOUSE-CHCC): CEA (CHCC-In House): 3.22 ng/mL (ref 0.00–5.00)

## 2022-04-09 NOTE — Progress Notes (Signed)
CLINIC:  Survivorship   Patient Care Team: Shirline Frees, MD as PCP - General (Family Medicine) [name], MD as Consulting Physician (Hematology and Oncology) [name], MD as Consulting Physician (General Surgery) Causey, Charlestine Massed, NP as Nurse Practitioner (Hematology and Oncology) Kyung Rudd, MD as Consulting Physician (Radiation Oncology)   REASON FOR VISIT:  Routine follow-up post-treatment for a recent history of rectal cancer.  BRIEF ONCOLOGIC HISTORY:  Oncology History  Adenocarcinoma of rectum ([place])  03/05/2021 Cancer Staging   Staging form: Colon and Rectum, AJCC 8th Edition - Clinical stage from 03/05/2021: Stage IIA (cT3, cN0, cM0) - Signed by Truitt Merle, MD on 07/29/2021 Stage prefix: Initial diagnosis Total positive nodes: 0   03/21/2021 Initial Diagnosis   Adenocarcinoma of rectum ([place])   04/03/2021 - 05/10/2021 Chemotherapy    Patient is on Treatment Plan: COLORECTAL CAPECITABINE + XRT       04/03/2021 - 05/10/2021 Radiation Therapy   Neoadjuvant chemo RT with Xeloda Site Technique Total Dose (Gy) Dose per Fx (Gy) Completed Fx Beam Energies  Rectum: Rectum 3D 45/45 1.8 25/25 10X, 15X  Rectum: Rectum_Bst 3D 5.4/5.4 1.8 3/3 15X    07/17/2021 Cancer Staging   Staging form: Colon and Rectum, AJCC 8th Edition - Pathologic stage from 07/17/2021: ypT0, pN0, cM0 - Signed by Truitt Merle, MD on 07/29/2021 Stage prefix: Post-therapy Total positive nodes: 0 Residual tumor (R): R0 - None   07/17/2021 Surgery   XI ROBOTIC ASSISTED LOWER ANTERIOR RESECTION DIVERTING ILEOSTOMY INTRAOPERATIVE ASSESSMENT OF PERFUSION By Dr. [name]     07/37/1062 - 01/05/2022 Adjuvant Chemotherapy   Adjuvant Xeloda, completed 6 cycles   04/09/2022 Survivorship   SCP delivered by Cira Rue, NP     INTERVAL HISTORY:  Mr. Grainger presents to the [facility] today for our initial meeting to review his survivorship care plan detailing her treatment course  for rectal cancer, as well as monitoring long-term side effects of that treatment, education regarding health maintenance, screening, and overall wellness and health promotion.     Overall, Mr. Goostree is doing okay.  He recently underwent EUA, flex sigmoidoscopy with debridement.  He is recovering, with residual rectal discharge and pain which are improving.  He takes Tylenol/Aleve during the day and occasionally oxycodone in the evening which is helpful.  He had 1 fever after the procedure but none since.  Stoma putting out watery stool, Imodium helps.  He notes his stools are usually loose but consistency varies.  He has a hernia at the stoma.  He notes he is still tired but out of bed and active, working.   ONCOLOGY TREATMENT TEAM:  1. Surgeon:  Dr. [name] at [facility] 2. Medical Oncologist: Dr. Chryl Heck 3. Radiation Oncologist: Dr. Lisbeth Renshaw    PAST MEDICAL/SURGICAL HISTORY:  Past Medical History:  Diagnosis Date   Anemia 02/2021   iron   Anxiety    Asthma    Cancer ([place]) 02/2021   Depression    Diabetes mellitus without complication ([place])    History of kidney stones    HLD (hyperlipidemia)    Hypertension    Kidney stones    Neuromuscular disorder ([place])    from radiation bi lat   OSA (obstructive sleep apnea)    CPAP   Peripheral vascular disease ([place])    DVT   Past Surgical History:  Procedure Laterality Date   APPENDECTOMY  1989   ag   COLONOSCOPY  03/05/2021   CYST EXCISION  2011  left neck   DIVERTING ILEOSTOMY N/A 07/17/2021   Procedure: DIVERTING ILEOSTOMY;  Surgeon: [name], MD;  [facility]: [facility];  Service: General;  Laterality: N/A;   FLEXIBLE SIGMOIDOSCOPY N/A 04/04/2022   Procedure: FLEXIBLE SIGMOIDOSCOPY WITH POSSIBLE DEBRIDEMENT;  Surgeon: [name], MD;  Location: [facility];  Service: General;  Laterality: N/A;   RECTAL EXAM UNDER ANESTHESIA N/A 04/04/2022   Procedure: ANAL EXAM UNDER ANESTHESIA;  Surgeon: [name], MD;  Location: [facility];  Service: General;  Laterality: N/A;   WISDOM TOOTH EXTRACTION     XI ROBOTIC ASSISTED LOWER ANTERIOR RESECTION N/A 07/17/2021   Procedure: XI ROBOTIC ASSISTED LOWER ANTERIOR RESECTION;  Surgeon: [name], MD;  [facility];  Service: General;  Laterality: N/A;     ALLERGIES:  Allergies  Allergen Reactions   Contrast Media [Iodinated Contrast Media] Hives    As a 51 year old experienced hives and nausea     CURRENT MEDICATIONS:  Outpatient Encounter Medications as of 04/09/2022  Medication Sig   acetaminophen (TYLENOL) 500 MG tablet Take 1,000 mg by mouth every 6 (six) hours as needed for mild pain.   albuterol (PROAIR HFA) 108 (90 Base) MCG/ACT inhaler 2 puffs as needed   ALPRAZolam (XANAX) 0.25 MG tablet Take 0.25 mg by mouth daily as needed for anxiety or sleep.   blood glucose meter kit and supplies KIT Dispense based on patient and insurance preference. Use up to four times daily as directed. (FOR ICD-9 250.00, 250.01).   Cinnamon 500 MG capsule Take 500 mg by mouth daily with supper.   fenofibrate 160 MG tablet Take 160 mg by mouth daily.   loperamide (IMODIUM) 2 MG capsule Take 2 capsules (4 mg total) by mouth 4 (four) times daily -  before meals and at bedtime.   ondansetron (ZOFRAN) 8 MG tablet Take 1 tablet (8 mg total) by mouth 2 (two) times daily as needed (Nausea or vomiting).   oxyCODONE (OXY IR/ROXICODONE) 5 MG immediate release tablet Take 1-2 tablets (5-10 mg total) by mouth every 6 (six) hours as needed for severe pain.   polycarbophil (FIBERCON) 625 MG tablet Take 1,250 mg by mouth in the morning and at bedtime.   prochlorperazine (COMPAZINE) 10 MG tablet Take 1 tablet (10 mg total) by mouth every 6 (six) hours as needed (Nausea or vomiting).   tetrahydrozoline-zinc (VISINE-AC) 0.05-0.25 % ophthalmic solution Place 2 drops into both eyes 4 (four) times daily as needed (allergy).   XIGDUO XR 01-999 MG TB24  Take 1 tablet by mouth 2 (two) times daily with a meal.   No facility-administered encounter medications on file as of 04/09/2022.     ONCOLOGIC FAMILY HISTORY:  Family History  Problem Relation Age of Onset   Diabetes Mellitus II Mother    Breast cancer Mother 64   Thrombophlebitis Father    Breast cancer Maternal Grandmother 61   Diabetes Maternal Grandmother    Breast cancer Maternal Aunt 60   Diabetes Maternal Aunt    Hypertension Paternal Grandmother    Colon cancer Neg Hx    Esophageal cancer Neg Hx    Inflammatory bowel disease Neg Hx    Liver disease Neg Hx    Pancreatic cancer Neg Hx    Rectal cancer Neg Hx    Stomach cancer Neg Hx      GENETIC COUNSELING/TESTING: No  SOCIAL HISTORY:  Rayner Erman is married with 3 children ages 46, 69, and 55.  Mr. Laduca  is currently working.  He drinks a beer every 3-4 months, denies current tobacco or drug use.       PHYSICAL EXAMINATION:  Vital Signs:   Vitals:   04/09/22 1225  BP: 127/73  Pulse: (!) 103  Resp: 20  Temp: (!) 97.5 F (36.4 C)  SpO2: 100%   Filed Weights   04/09/22 1225  Weight: 182 lb 9.6 oz (82.8 kg)   General: well-appearing, no acute distress.   HEENT:  Sclerae anicteric.  Lymph: No cervical, supraclavicular, or infraclavicular lymphadenopathy noted on palpation.  Cardiovascular: Regular rate and rhythm. Respiratory: Clear, breathing non-labored.  GI: Abdomen soft and round; non-tender, non-distended. Bowel sounds normoactive.  Ileostomy noted with peristomal hernia Neuro: No focal deficits. Steady gait.  Psych: Mood and affect normal and appropriate for situation.  Extremities: No edema. MSK: No focal spinal tenderness to palpation.   Skin: Warm and dry. Rectal exam deferred  LABORATORY DATA:     Latest Ref Rng & Units 01/03/2022   12:58 PM 12/05/2021    9:56 AM 11/15/2021   12:18 PM  CBC  WBC 4.0 - 10.5 K/uL 6.7  6.6  6.6   Hemoglobin 13.0 - 17.0 g/dL 10.9  11.7  12.0   Hematocrit  39.0 - 52.0 % 35.3  36.8  37.6   Platelets 150 - 400 K/uL 332  274  261        Latest Ref Rng & Units 03/06/2022   12:36 PM 01/03/2022   12:58 PM 12/05/2021    9:56 AM  CMP  Glucose 70 - 99 mg/dL  247  244   BUN 6 - 20 mg/dL  18  26   Creatinine 0.61 - 1.24 mg/dL 1.10  0.97  0.93   Sodium 135 - 145 mmol/L  136  135   Potassium 3.5 - 5.1 mmol/L  4.4  4.9   Chloride 98 - 111 mmol/L  102  101   CO2 22 - 32 mmol/L  23  26   Calcium 8.9 - 10.3 mg/dL  9.6  9.9   Total Protein 6.5 - 8.1 g/dL  7.5  7.3   Total Bilirubin 0.3 - 1.2 mg/dL  0.5  0.5   Alkaline Phos 38 - 126 U/L  93  92   AST 15 - 41 U/L  9  11   ALT 0 - 44 U/L  9  14      DIAGNOSTIC IMAGING:  None for this visit.      ASSESSMENT AND PLAN:  Mr.. Thammavong is a pleasant 51 y.o. male with Stage II adenocarcinoma of the rectum s/p neoadjuvant chemoradiation with Xeloda followed by definitive surgery and adjuvant Xeloda.  He presents to the Survivorship Clinic for our initial meeting and routine follow-up post-completion of treatment for rectal cancer.    1. Stage II rectal cancer:  Mr. Crookshanks is continuing to recover from definitive treatment for rectal cancer.  We discussed the trajectory for recovery following neoadjuvant chemoradiation, surgery, and adjuvant chemo.  He is recovering from a recent infection at the anastomosis which was debrided by Dr. Marcello Moores 7/7.  He will follow-up with Dr. Marcello Moores in 04/2022 and with medical oncologist, Dr. Chryl Heck in October with history and physical exam per surveillance protocol.  Today, a comprehensive survivorship care plan and treatment summary was reviewed with the patient today detailing his rectal cancer diagnosis, treatment course, potential late/long-term effects of treatment, appropriate follow-up care with recommendations for the future, and patient education resources.  A  copy of this summary, along with a letter will be sent to the patient's primary care provider via mail/fax/In Basket message  after today's visit.    2. Health maintenance and wellness promotion: Mr. Fung was encouraged to consume 5-7 servings of fruits and vegetables per day. He was also encouraged to engage physical exercise once cleared by Dr. Marcello Moores. He was encouraged to continue to limit alcohol and to abstain from tobacco use.     3. Support services/counseling: It is not uncommon for this period of the patient's cancer care trajectory to be one of many emotions and stressors.  He was offered support today through active listening and expressive supportive counseling.  He was given information regarding our available services and encouraged to contact me with any questions or for help enrolling in any of our support group/programs.    Dispo:   -Lab today  -Follow-up with surgery, Dr. Marcello Moores, 04/2022 as scheduled  -Follow-up with medical oncology Dr. Chryl Heck 07/07/2022 as scheduled -Message to Dr. Rush Landmark re: surveillance colonoscopy   He is welcome to return back to the Survivorship Clinic at any time; no additional follow-up needed at this time. Consider referral back to survivorship as a long-term survivor for continued surveillance  A total of (40) minutes of face-to-face time was spent with this patient with greater than 50% of that time in counseling, review of test results, and care-coordination.   Cira Rue, NP Survivorship Program [facility] [phone]   Note: PRIMARY CARE PROVIDER Shirline Frees, [place] [phone]

## 2022-04-10 ENCOUNTER — Encounter: Payer: Self-pay | Admitting: Nurse Practitioner

## 2022-04-14 DIAGNOSIS — Z932 Ileostomy status: Secondary | ICD-10-CM | POA: Diagnosis not present

## 2022-04-14 DIAGNOSIS — C20 Malignant neoplasm of rectum: Secondary | ICD-10-CM | POA: Diagnosis not present

## 2022-04-21 ENCOUNTER — Other Ambulatory Visit: Payer: Self-pay

## 2022-05-01 ENCOUNTER — Emergency Department (HOSPITAL_COMMUNITY)
Admission: EM | Admit: 2022-05-01 | Discharge: 2022-05-01 | Disposition: A | Discharge status: Home / Self Care | Payer: BC Managed Care – PPO | Attending: Emergency Medicine | Admitting: Emergency Medicine

## 2022-05-01 ENCOUNTER — Encounter (HOSPITAL_COMMUNITY): Payer: Self-pay | Admitting: Emergency Medicine

## 2022-05-01 ENCOUNTER — Other Ambulatory Visit: Payer: Self-pay

## 2022-05-01 DIAGNOSIS — M546 Pain in thoracic spine: Secondary | ICD-10-CM | POA: Diagnosis not present

## 2022-05-01 DIAGNOSIS — I10 Essential (primary) hypertension: Secondary | ICD-10-CM | POA: Insufficient documentation

## 2022-05-01 DIAGNOSIS — Y92410 Unspecified street and highway as the place of occurrence of the external cause: Secondary | ICD-10-CM | POA: Insufficient documentation

## 2022-05-01 DIAGNOSIS — M62830 Muscle spasm of back: Secondary | ICD-10-CM | POA: Diagnosis not present

## 2022-05-01 DIAGNOSIS — E119 Type 2 diabetes mellitus without complications: Secondary | ICD-10-CM | POA: Diagnosis not present
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
## 2022-05-01 NOTE — Discharge Instructions (Signed)
The pain you are experiencing is likely due to muscle strain, you may take Ibuprofen and home pain medication as needed for pain management. Do not combine with any pain reliever other than tylenol.  You may also use ice and heat, and over-the-counter remedies such as Biofreeze gel or salon pas lidocaine patches. The muscle soreness should improve over the next week. Follow up with your family doctor in the next week for a recheck if you are still having symptoms. Return to ED if pain is worsening, you develop weakness or numbness of extremities, or new or concerning symptoms develop.

## 2022-05-23 DIAGNOSIS — C20 Malignant neoplasm of rectum: Secondary | ICD-10-CM | POA: Diagnosis not present

## 2022-05-23 DIAGNOSIS — Z932 Ileostomy status: Secondary | ICD-10-CM | POA: Diagnosis not present

## 2022-06-06 DIAGNOSIS — Z932 Ileostomy status: Secondary | ICD-10-CM | POA: Diagnosis not present

## 2022-06-06 DIAGNOSIS — C20 Malignant neoplasm of rectum: Secondary | ICD-10-CM | POA: Diagnosis not present

## 2022-06-16 DIAGNOSIS — K6289 Other specified diseases of anus and rectum: Secondary | ICD-10-CM | POA: Diagnosis not present

## 2022-06-16 DIAGNOSIS — Z9889 Other specified postprocedural states: Secondary | ICD-10-CM | POA: Diagnosis not present

## 2022-06-20 DIAGNOSIS — E1165 Type 2 diabetes mellitus with hyperglycemia: Secondary | ICD-10-CM | POA: Diagnosis not present

## 2022-06-20 DIAGNOSIS — F419 Anxiety disorder, unspecified: Secondary | ICD-10-CM | POA: Diagnosis not present

## 2022-06-20 DIAGNOSIS — I10 Essential (primary) hypertension: Secondary | ICD-10-CM | POA: Diagnosis not present

## 2022-06-20 DIAGNOSIS — E7800 Pure hypercholesterolemia, unspecified: Secondary | ICD-10-CM | POA: Diagnosis not present

## 2022-07-07 ENCOUNTER — Inpatient Hospital Stay: Payer: BC Managed Care – PPO | Attending: Hematology and Oncology | Admitting: Hematology and Oncology

## 2022-07-07 ENCOUNTER — Other Ambulatory Visit: Payer: Self-pay | Admitting: *Deleted

## 2022-07-07 ENCOUNTER — Encounter: Payer: Self-pay | Admitting: Hematology and Oncology

## 2022-07-07 ENCOUNTER — Inpatient Hospital Stay (HOSPITAL_BASED_OUTPATIENT_CLINIC_OR_DEPARTMENT_OTHER): Payer: BC Managed Care – PPO | Admitting: Hematology and Oncology

## 2022-07-07 ENCOUNTER — Telehealth: Payer: Self-pay

## 2022-07-07 VITALS — BP 144/86 | HR 85 | Temp 97.7°F | Resp 16 | Ht 70.0 in | Wt 187.9 lb

## 2022-07-07 DIAGNOSIS — E119 Type 2 diabetes mellitus without complications: Secondary | ICD-10-CM | POA: Diagnosis not present

## 2022-07-07 DIAGNOSIS — C20 Malignant neoplasm of rectum: Secondary | ICD-10-CM

## 2022-07-07 DIAGNOSIS — Z86711 Personal history of pulmonary embolism: Secondary | ICD-10-CM | POA: Diagnosis not present

## 2022-07-07 DIAGNOSIS — Z85048 Personal history of other malignant neoplasm of rectum, rectosigmoid junction, and anus: Secondary | ICD-10-CM | POA: Insufficient documentation

## 2022-07-07 DIAGNOSIS — G620 Drug-induced polyneuropathy: Secondary | ICD-10-CM | POA: Diagnosis not present

## 2022-07-07 DIAGNOSIS — E785 Hyperlipidemia, unspecified: Secondary | ICD-10-CM | POA: Insufficient documentation

## 2022-07-07 DIAGNOSIS — I10 Essential (primary) hypertension: Secondary | ICD-10-CM | POA: Insufficient documentation

## 2022-07-07 DIAGNOSIS — Z86718 Personal history of other venous thrombosis and embolism: Secondary | ICD-10-CM | POA: Insufficient documentation

## 2022-07-07 DIAGNOSIS — T451X5A Adverse effect of antineoplastic and immunosuppressive drugs, initial encounter: Secondary | ICD-10-CM | POA: Diagnosis not present

## 2022-07-07 LAB — CBC WITH DIFFERENTIAL/PLATELET
Abs Immature Granulocytes: 0.04 10*3/uL (ref 0.00–0.07)
Basophils Absolute: 0 10*3/uL (ref 0.0–0.1)
Basophils Relative: 0 %
Eosinophils Absolute: 0.5 10*3/uL (ref 0.0–0.5)
Eosinophils Relative: 7 %
HCT: 42.2 % (ref 39.0–52.0)
Hemoglobin: 13.2 g/dL (ref 13.0–17.0)
Immature Granulocytes: 1 %
Lymphocytes Relative: 26 %
Lymphs Abs: 2 10*3/uL (ref 0.7–4.0)
MCH: 23 pg — ABNORMAL LOW (ref 26.0–34.0)
MCHC: 31.3 g/dL (ref 30.0–36.0)
MCV: 73.5 fL — ABNORMAL LOW (ref 80.0–100.0)
Monocytes Absolute: 0.4 10*3/uL (ref 0.1–1.0)
Monocytes Relative: 5 %
Neutro Abs: 4.5 10*3/uL (ref 1.7–7.7)
Neutrophils Relative %: 61 %
Platelets: 310 10*3/uL (ref 150–400)
RBC: 5.74 MIL/uL (ref 4.22–5.81)
RDW: 18.5 % — ABNORMAL HIGH (ref 11.5–15.5)
WBC: 7.4 10*3/uL (ref 4.0–10.5)
nRBC: 0 % (ref 0.0–0.2)

## 2022-07-07 LAB — COMPREHENSIVE METABOLIC PANEL
ALT: 20 U/L (ref 0–44)
AST: 15 U/L (ref 15–41)
Albumin: 4.4 g/dL (ref 3.5–5.0)
Alkaline Phosphatase: 92 U/L (ref 38–126)
Anion gap: 8 (ref 5–15)
BUN: 28 mg/dL — ABNORMAL HIGH (ref 6–20)
CO2: 25 mmol/L (ref 22–32)
Calcium: 9.8 mg/dL (ref 8.9–10.3)
Chloride: 103 mmol/L (ref 98–111)
Creatinine, Ser: 1.16 mg/dL (ref 0.61–1.24)
GFR, Estimated: >60 mL/min (ref >60)
Glucose, Bld: 260 mg/dL — ABNORMAL HIGH (ref 70–99)
Potassium: 4.5 mmol/L (ref 3.5–5.1)
Sodium: 136 mmol/L (ref 135–145)
Total Bilirubin: 0.5 mg/dL (ref 0.3–1.2)
Total Protein: 7.6 g/dL (ref 6.5–8.1)

## 2022-07-07 LAB — IRON AND IRON BINDING CAPACITY (CC-WL,HP ONLY)
Iron: 49 ug/dL (ref 45–182)
Saturation Ratios: 10 % — ABNORMAL LOW (ref 17.9–39.5)
TIBC: 512 ug/dL — ABNORMAL HIGH (ref 250–450)
UIBC: 463 ug/dL — ABNORMAL HIGH (ref 117–376)

## 2022-07-07 LAB — HEMOGLOBIN A1C
Hgb A1c MFr Bld: 9.2 % — ABNORMAL HIGH (ref 4.8–5.6)
Mean Plasma Glucose: 217.34 mg/dL

## 2022-07-07 LAB — FERRITIN: Ferritin: 92 ng/mL (ref 24–336)

## 2022-07-07 LAB — CEA (ACCESS): CEA (CHCC): 3.11 ng/mL (ref 0.00–5.00)

## 2022-07-07 MED ORDER — PREDNISONE 50 MG PO TABS: ORAL_TABLET | ORAL | 1 refills | Status: DC

## 2022-07-07 MED ORDER — GABAPENTIN 300 MG PO CAPS: 300.0000 mg | ORAL_CAPSULE | Freq: Every day | ORAL | 3 refills | Status: DC

## 2022-07-07 NOTE — Assessment & Plan Note (Signed)
This is a very pleasant 51 year old male patient with newly diagnosed T3BN0 adenocarcinoma of the rectum, MSS with no evidence of distant metastasis following up with medical oncology for consideration of concurrent chemoradiation.   He has T3N0 or stage IIa adenocarcinoma of the rectum and treatment recommendations from TB suggested concurrent chemotherapy radiation with Xeloda versus 5-fluorouracil followed by consideration for surgery and adjuvant chemotherapy   Given concern for 4 mm perirectal lymph node which is not entirely diagnostic for involvement, we have also discussed total neoadjuvant approach which patient was reluctant given some issues with his work and disability leave.  He wanted to proceed with neoadjuvant chemoradiation followed by surgery and adjuvant chemotherapy because of some logistics with his disability leave.  He started Xeloda on 04/03/2021, last date of treatment 05/10/2021.  Treatment complicated by some mild fatigue and tenesmus otherwise no major complications. He had definitive resection by Dr. Marcello Moores on July 17, 2021.  Pathology showed complete response with no residual carcinoma.  He completed 6 cycles of adjuvant Xeloda.  He is here for follow-up.  Since last visit he reports some neuropathy in his feet, he describes the bottom of the feet as spongy or rubbery sensation along with some cramps in his lower extremities.  Apart from this, and he has not noticed any significant change in his bowel habits.  No other concerning review of systems.  Physical examination today quite unremarkable. We will repeat CBC, CMP and CEA.  CT imaging ordered for December.  He is due for another colonoscopy, sent a message to Dr. Rush Landmark, Dr. Rush Landmark and Dr. Marcello Moores will coordinate his next colonoscopy. He will return to clinic in about 3 months or sooner as needed.

## 2022-07-07 NOTE — Telephone Encounter (Signed)
The recall has been entered as ordered  

## 2022-07-07 NOTE — Telephone Encounter (Signed)
-----   Message from Irving Copas., MD sent at 07/07/2022 11:24 AM EDT ----- PI, Thanks for reaching out. Why do not we let AT evaluate the patient in clinic and then let us know in regards to if she feels he is ready to move forward with colonoscopy since she just did the flex sig EUA. I will have him on a recall list and get his procedure done before the end of the year when she gives me the okay. I believe she is out of town until the latter portion of this month but I think that that is okay. Thanks. GM  Ziggy Reveles, Please place a patient recall for colonoscopy in the [place] in November, pending Dr. Marcello Moores seen the patient. Thanks. GM ----- Message ----- From: [name], MD Sent: 07/07/2022   9:36 AM EDT To: Irving Copas., MD  Dr Rush Landmark,  Steven [name] is wondering if he is due for another colonoscopy. His last scope was in July 2022  Thanks,

## 2022-07-07 NOTE — Assessment & Plan Note (Signed)
Peripheral neuropathy secondary to chemotherapy.  We have discussed about considering gabapentin.  If he does not have any significant improvement with gabapentin, we can try Cymbalta for peripheral neuropathy.

## 2022-07-07 NOTE — Progress Notes (Signed)
[place] [facility] Follow up:    Steven Frees, MD [address] [place] [address]   DIAGNOSIS:  Cancer Staging  Adenocarcinoma of rectum [facility]) Staging form: Colon and Rectum, AJCC 8th Edition - Clinical stage from 03/05/2021: Stage IIA (cT3, cN0, cM0) - Signed by Truitt Merle, MD on 07/29/2021 Stage prefix: Initial diagnosis Total positive nodes: 0 - Pathologic stage from 07/17/2021: ypT0, pN0, cM0 - Signed by Truitt Merle, MD on 07/29/2021 Stage prefix: Post-therapy Total positive nodes: 0 Residual tumor (R): R0 - None   SUMMARY OF ONCOLOGIC HISTORY:  He underwent CT AP 11/23/20 to rule out malignancy after PE. This showed asymmetric soft tissue in rectum. -colonoscopy 03/05/21 with Dr. Rush Landmark revealed a palpable rectal mass on digital rectal exam, measuring 5 cm on scope. Pathology confirmed adenocarcinoma. -staging CT CAP 03/14/21 showed questionable tiny perirectal lymph nodes, but negative node on staging pelvic MRI 03/15/21. -he received neoadjuvant concurrent chemoradiation 04/03/21-05/10/21. -definitive resection on 07/17/21 under Dr. Marcello Moores. Pathology showed complete response with no residual carcinoma. Lymph nodes and margins negative. -He saw Dr. Marcello Moores on 08/19/21 for increasing postoperative rectal pain and was found to have some anastomotic dehiscence. Because of the slow healing and lack of improvement from antibiotics, there was delay in starting Xeloda and he was finally able to start Xeloda on 09/09/2021.   He completed 6 cycles of adjuvant xeloda.  CURRENT THERAPY: Xeloda  INTERVAL HISTORY: Steven [name] 51 y.o. male returns for evaluation of his history of rectal cancer.    Patient is here for follow-up.  Since last visit, he continues to improve.  He had a couple episodes of diarrhea in the past week which have spontaneously resolved.  He otherwise has started gaining weight.  No hematochezia or melena.  No new cough, chest pain or  shortness of breath.  He however has started noticing neuropathy in his feet, he feels that the bottom of his feet have this rubbery sensation or feel spongy, he cannot quite feel very well.  Denies any falls.  He was if he can take something for the neuropathy.  He is also some cramps in his toes.  He has not had a colonoscopy since last visit with Bella Kennedy. He has a follow-up with Dr. Marcello Moores next month.    Rest of the pertinent 10 point ROS reviewed and negative.  Patient Active Problem List   Diagnosis Date Noted   Cancer related pain 04/30/2021   Tenesmus 04/16/2021   Chemotherapy-induced nausea 04/03/2021   Adenocarcinoma of rectum ([place]) 03/21/2021   Colon cancer screening 12/26/2020   Abnormal finding on CT scan 12/26/2020   History of rectal bleeding 12/26/2020   Gastroesophageal reflux disease 12/26/2020   Diabetes mellitus due to underlying condition, uncontrolled, with hyperglycemia, without long-term current use of insulin ([place]) 04/26/2019   Obesity (BMI 30-39.9) 04/26/2019   Asthma    Anxiety    OSA (obstructive sleep apnea)    HLD (hyperlipidemia)    Bilateral pulmonary embolism ([place]) 04/24/2019    is allergic to contrast media [iodinated contrast media].  MEDICAL HISTORY: Past Medical History:  Diagnosis Date   Anemia 02/2021   iron   Anxiety    Asthma    Cancer ([place]) 02/2021   Depression    Diabetes mellitus without complication ([place])    History of kidney stones    HLD (hyperlipidemia)    Hypertension    Kidney stones    Neuromuscular disorder ([place])    from  radiation bi lat   OSA (obstructive sleep apnea)    CPAP   Peripheral vascular disease ([place])    DVT    SURGICAL HISTORY: Past Surgical History:  Procedure Laterality Date   APPENDECTOMY  1989   ag   COLONOSCOPY  03/05/2021   CYST EXCISION  2011   left neck   DIVERTING ILEOSTOMY N/A 07/17/2021   Procedure: DIVERTING ILEOSTOMY;  Surgeon: [name], MD;  Location: [facility];  Service: General;   Laterality: N/A;   FLEXIBLE SIGMOIDOSCOPY N/A 04/04/2022   Procedure: FLEXIBLE SIGMOIDOSCOPY WITH POSSIBLE DEBRIDEMENT;  Surgeon: [name], MD;  Location: [facility];  Service: General;  Laterality: N/A;   RECTAL EXAM UNDER ANESTHESIA N/A 04/04/2022   Procedure: ANAL EXAM UNDER ANESTHESIA;  Surgeon: [name], MD;  Location: [facility];  Service: General;  Laterality: N/A;   WISDOM TOOTH EXTRACTION     XI ROBOTIC ASSISTED LOWER ANTERIOR RESECTION N/A 07/17/2021   Procedure: XI ROBOTIC ASSISTED LOWER ANTERIOR RESECTION;  Surgeon: [name], MD;  Location: [facility];  Service: General;  Laterality: N/A;    SOCIAL HISTORY: Social History   Socioeconomic History   Marital status: Married    Spouse name: Not on file   Number of children: 3   Years of education: Not on file   Highest education level: Not on file  Occupational History   Occupation: Gaffer  Tobacco Use   Smoking status: Never   Smokeless tobacco: Never  Vaping Use   Vaping Use: Never used  Substance and Sexual Activity   Alcohol use: Yes    Comment: 1 beer every 3-4 months   Drug use: Never   Sexual activity: Yes  Other Topics Concern   Not on file  Social History Narrative   Not on file   Social Determinants of Health   Financial Resource Strain: Not on file  Food Insecurity: Not on file  Transportation Needs: Not on file  Physical Activity: Not on file  Stress: Not on file  Social Connections: Not on file  Intimate Partner Violence: Not on file    FAMILY HISTORY: Family History  Problem Relation Age of Onset   Diabetes Mellitus II Mother    Breast cancer Mother 33   Thrombophlebitis Father    Breast cancer Maternal Grandmother 20   Diabetes Maternal Grandmother    Breast cancer Maternal Aunt 60   Diabetes Maternal Aunt    Hypertension Paternal Grandmother    Colon cancer Neg Hx    Esophageal cancer Neg Hx    Inflammatory bowel disease Neg Hx     Liver disease Neg Hx    Pancreatic cancer Neg Hx    Rectal cancer Neg Hx    Stomach cancer Neg Hx     Review of Systems  Constitutional:  Negative for appetite change, chills, fatigue, fever and unexpected weight change.  HENT:   Negative for hearing loss, lump/mass and trouble swallowing.   Eyes:  Negative for eye problems and icterus.  Respiratory:  Negative for chest tightness, cough and shortness of breath.   Cardiovascular:  Negative for chest pain, leg swelling and palpitations.  Gastrointestinal:  Negative for abdominal distention, abdominal pain, constipation, diarrhea, nausea and vomiting.  Endocrine: Negative for hot flashes.  Genitourinary:  Negative for difficulty urinating.   Musculoskeletal:  Negative for arthralgias.  Skin:  Negative for itching and rash.  Neurological:  Positive for numbness. Negative for dizziness, extremity weakness and headaches.  Hematological:  Negative for adenopathy. Does not bruise/bleed easily.  Psychiatric/Behavioral:  Negative for depression. The patient is not nervous/anxious.       PHYSICAL EXAMINATION  ECOG PERFORMANCE STATUS: 1 - Symptomatic but completely ambulatory  Vitals:   07/07/22 0910  BP: (!) 144/86  Pulse: 85  Resp: 16  Temp: 97.7 F (36.5 C)  SpO2: 100%    Physical Exam Constitutional:      General: He is not in acute distress.    Appearance: Normal appearance. He is not toxic-appearing.  HENT:     Head: Normocephalic and atraumatic.  Eyes:     General: No scleral icterus. Cardiovascular:     Rate and Rhythm: Normal rate and regular rhythm.     Pulses: Normal pulses.     Heart sounds: Normal heart sounds.  Pulmonary:     Effort: Pulmonary effort is normal.     Breath sounds: Normal breath sounds.  Abdominal:     General: Abdomen is flat. Bowel sounds are normal. There is no distension.     Palpations: Abdomen is soft.     Tenderness: There is no abdominal tenderness.     Comments: Ostomy bag   Musculoskeletal:        General: No swelling.     Cervical back: Neck supple.  Lymphadenopathy:     Cervical: No cervical adenopathy.  Skin:    General: Skin is warm and dry.     Findings: No rash.  Neurological:     General: No focal deficit present.     Mental Status: He is alert.  Psychiatric:        Mood and Affect: Mood normal.        Behavior: Behavior normal.     LABORATORY DATA:  CBC    Component Value Date/Time   WBC 6.5 04/09/2022 1356   RBC 5.20 04/09/2022 1356   HGB 11.5 (L) 04/09/2022 1356   HGB 11.7 (L) 12/05/2021 0956   HCT 36.9 (L) 04/09/2022 1356   PLT 355 04/09/2022 1356   PLT 274 12/05/2021 0956   MCV 71.0 (L) 04/09/2022 1356   MCH 22.1 (L) 04/09/2022 1356   MCHC 31.2 04/09/2022 1356   RDW 17.0 (H) 04/09/2022 1356   LYMPHSABS 2.1 04/09/2022 1356   MONOABS 0.5 04/09/2022 1356   EOSABS 0.2 04/09/2022 1356   BASOSABS 0.0 04/09/2022 1356    CMP     Component Value Date/Time   NA 137 04/09/2022 1356   K 4.2 04/09/2022 1356   CL 104 04/09/2022 1356   CO2 21 (L) 04/09/2022 1356   GLUCOSE 153 (H) 04/09/2022 1356   BUN 22 (H) 04/09/2022 1356   CREATININE 1.10 04/09/2022 1356   CREATININE 0.93 12/05/2021 0956   CALCIUM 10.0 04/09/2022 1356   PROT 7.8 04/09/2022 1356   ALBUMIN 3.6 04/09/2022 1356   AST 13 (L) 04/09/2022 1356   AST 11 (L) 12/05/2021 0956   ALT 13 04/09/2022 1356   ALT 14 12/05/2021 0956   ALKPHOS 96 04/09/2022 1356   BILITOT 0.8 04/09/2022 1356   BILITOT 0.5 12/05/2021 0956   GFRNONAA >60 04/09/2022 1356   GFRNONAA >60 12/05/2021 0956   GFRAA >60 04/25/2019 0255        ASSESSMENT and THERAPY PLAN:   No problem-specific Assessment & Plan notes found for this encounter.  He can discontinue anticoagulation at this time.  All questions were answered. The patient knows to call the clinic with any problems, questions or concerns. We can certainly see the  patient much sooner if necessary.  Total encounter time: 30 minutes  in face-to-face visit time, chart review, lab review, care coordination, order entry, and documentation of the encounter.   *Total Encounter Time as defined by the Centers for Medicare and Medicaid Services includes, in addition to the face-to-face time of a patient visit (documented in the note above) non-face-to-face time: obtaining and reviewing outside history, ordering and reviewing medications, tests or procedures, care coordination (communications with other health care professionals or caregivers) and documentation in the medical record.

## 2022-07-09 NOTE — Progress Notes (Signed)
Lab results faxed to Dr Kenton Kingfisher [phone]. Fax confirmation received.

## 2022-07-13 DIAGNOSIS — Z932 Ileostomy status: Secondary | ICD-10-CM | POA: Diagnosis not present

## 2022-07-13 DIAGNOSIS — C20 Malignant neoplasm of rectum: Secondary | ICD-10-CM | POA: Diagnosis not present

## 2022-07-13 IMAGING — MR MR PELVIS W/O CM
7 series · 46 of 48 positions shown · non-contrast
Comparison: CTs, including 03/14/2021. The report of the
colonoscopy of 03/05/2021 is also reviewed.

CLINICAL DATA: New diagnosis of rectal cancer.

EXAM:
MRI PELVIS WITHOUT CONTRAST
TECHNIQUE: Multiplanar multisequence MR imaging of the pelvis was performed. No
intravenous contrast was administered. Ultrasound gel was
administered per rectum to optimize tumor evaluation.

[Series 2: T2 · sagittal · 3.0mm · 0.74mm/px · 5 of 45 slices shown (1 of 5)]
[im 1/45]
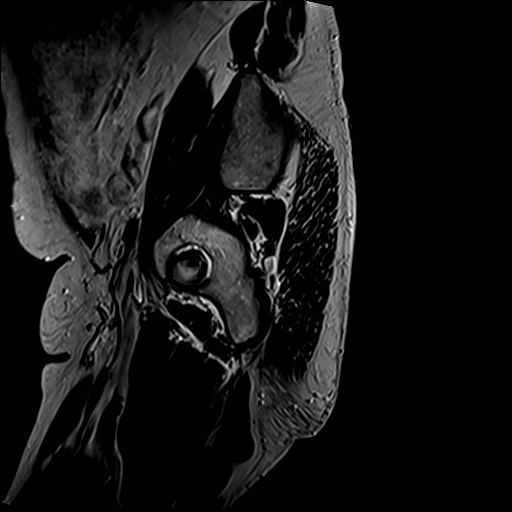
[im 12/45]
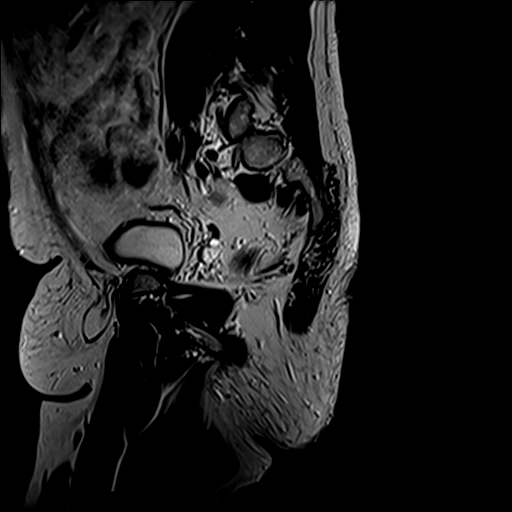
[im 23/45]
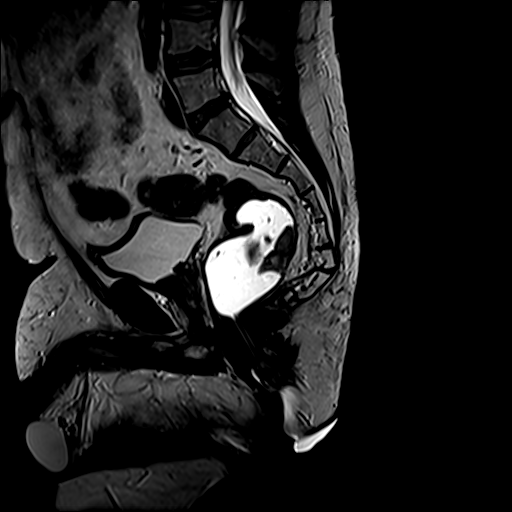
[im 34/45]
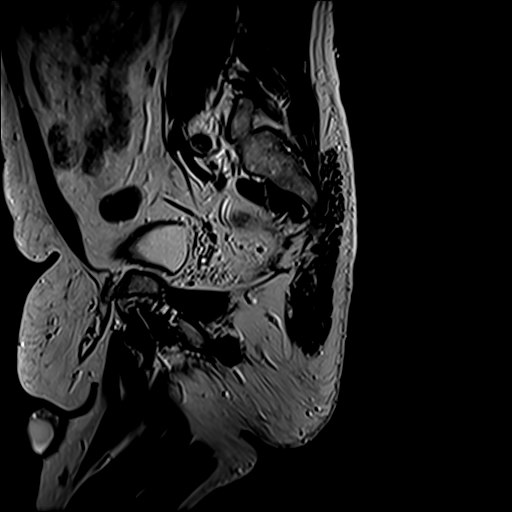
[im 45/45]
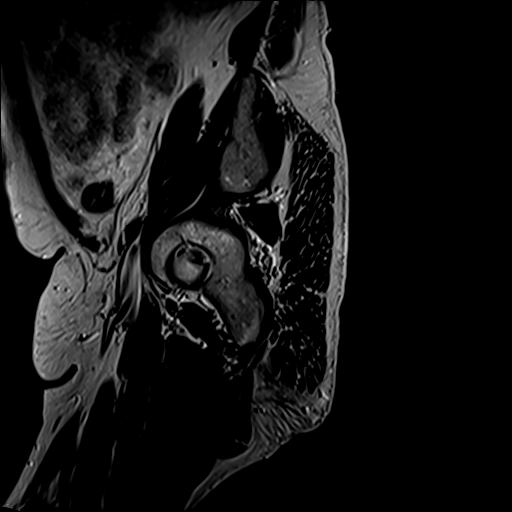

[Series 3: T2 · axial · 5.0mm · 0.99mm/px · z∈[-160,+86]mm · 6 of 42 slices shown (2 of 5)]
[im 1/42]
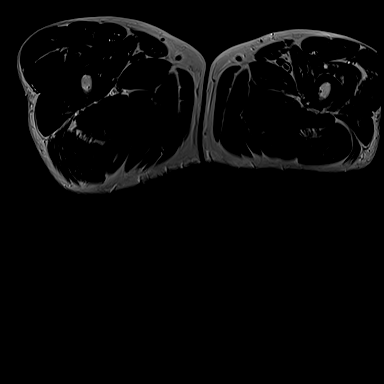
[im 9/42]
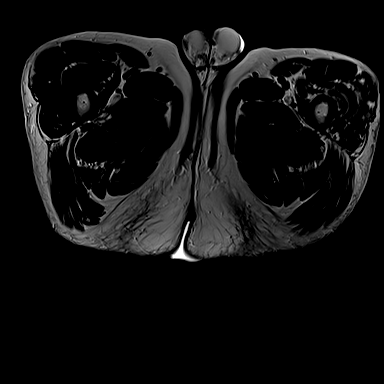
[im 17/42]
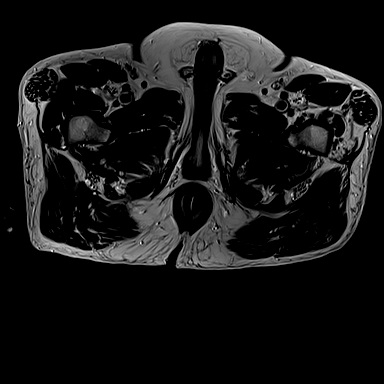
[im 25/42]
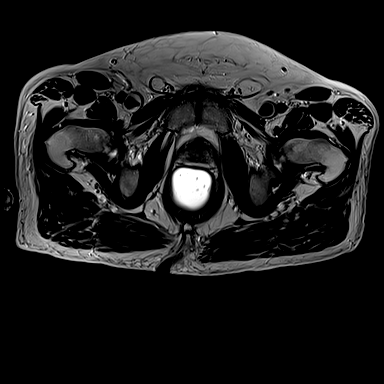
[im 33/42]
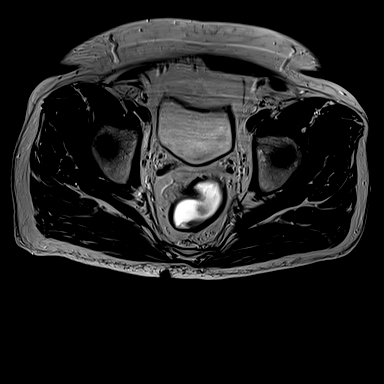
[im 42/42]
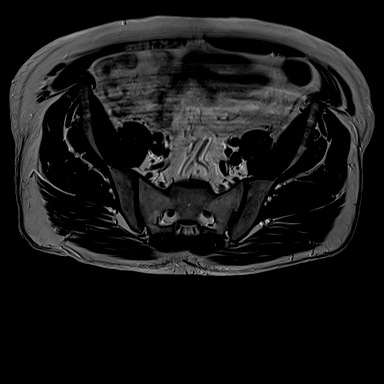

[Series 4: T2 · coronal · 3.0mm · 0.70mm/px · 7 of 50 slices shown (3 of 5)]
[im 1/50]
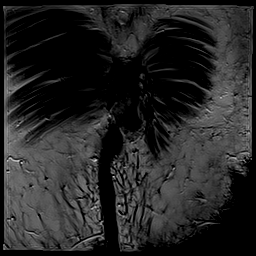
[im 9/50]
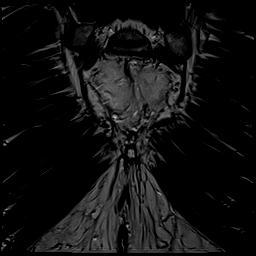
[im 17/50]
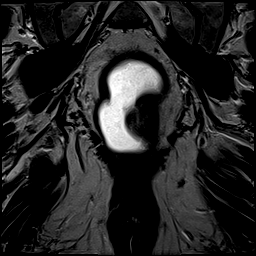
[im 25/50]
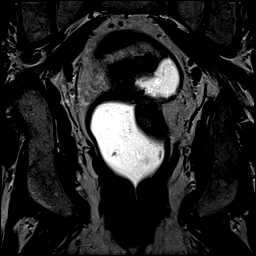
[im 33/50]
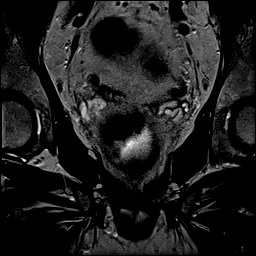
[im 41/50]
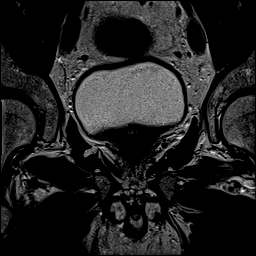
[im 50/50]
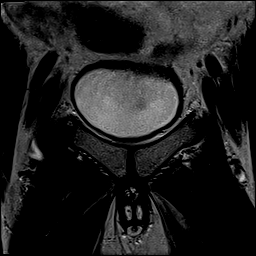

[Series 5: T2 · coronal · 3.0mm · 0.70mm/px · 7 of 52 slices shown (4 of 5)]
[im 1/52]
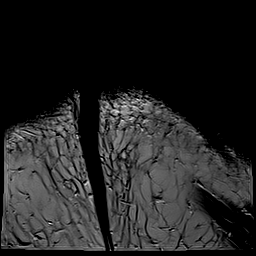
[im 9/52]
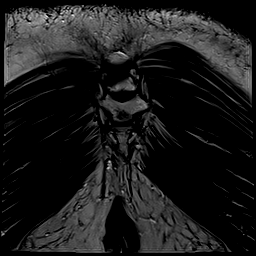
[im 18/52]
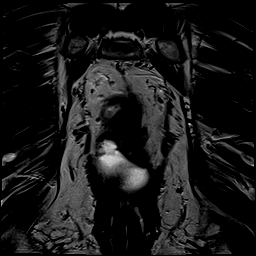
[im 26/52]
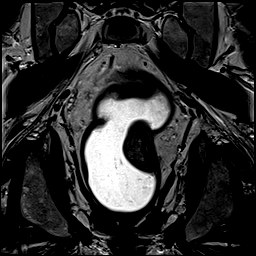
[im 35/52]
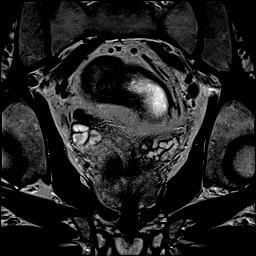
[im 43/52]
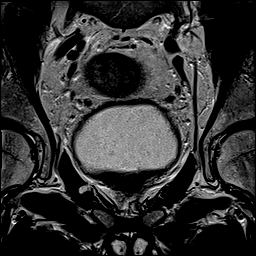
[im 52/52]
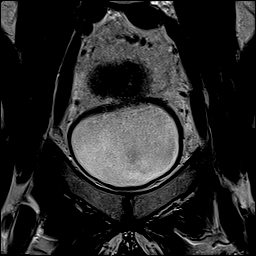

[Series 6: T2 · axial · 3.0mm · 0.70mm/px · z∈[-34,+86]mm · 6 of 42 slices shown (5 of 5)]
[im 1/42]
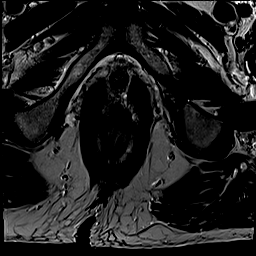
[im 9/42]
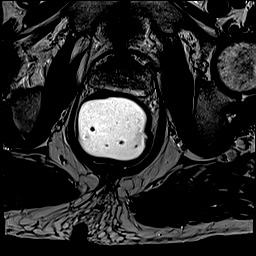
[im 17/42]
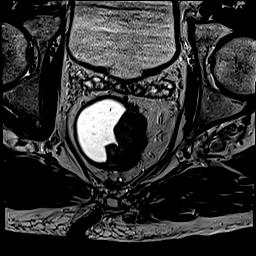
[im 25/42]
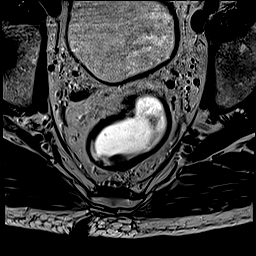
[im 33/42]
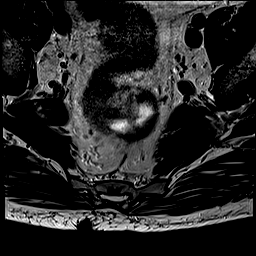
[im 42/42]
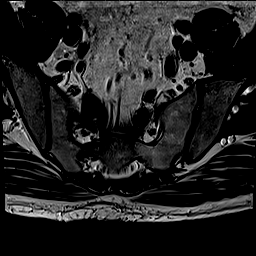

[Series 7: DWI · axial · 5.0mm · 1.48mm/px · z∈[-162,+84]mm · 9 of 84 slices shown (1 of 2)]
[im 1/84]
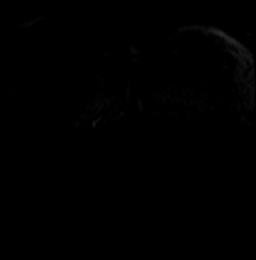
[im 9/84]
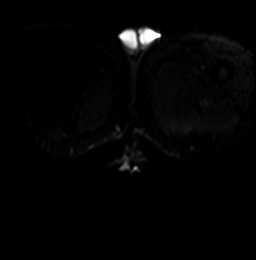
[im 17/84]
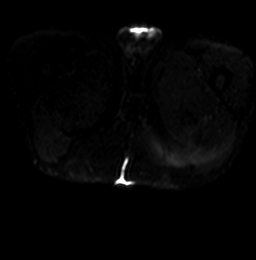
[im 25/84]
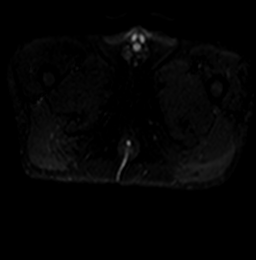
[im 34/84]
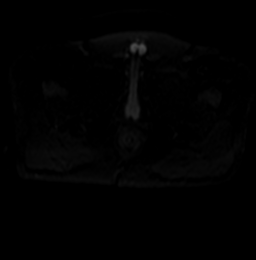
[im 50/84]
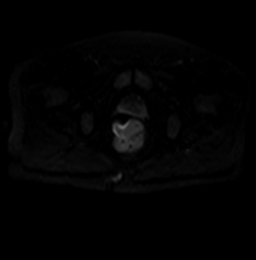
[im 59/84]
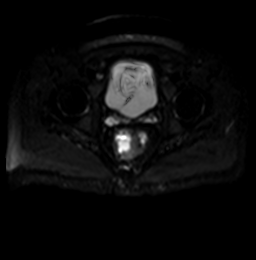
[im 67/84]
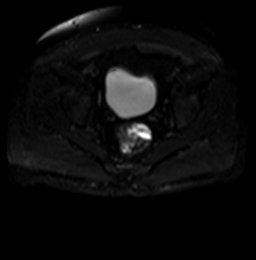
[im 84/84]
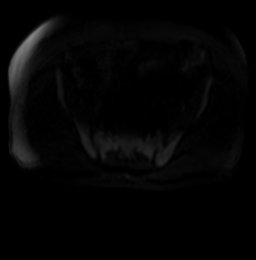

[Series 8: DWI · axial · 5.0mm · 1.48mm/px · z∈[-162,+84]mm · 6 of 42 slices shown (2 of 2)]
[im 1/42]
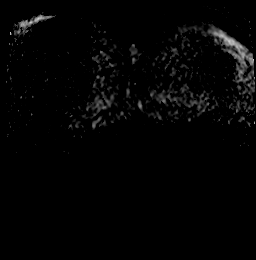
[im 9/42]
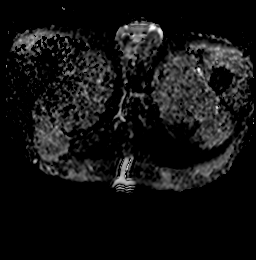
[im 17/42]
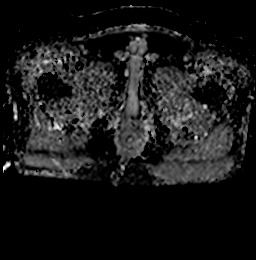
[im 25/42]
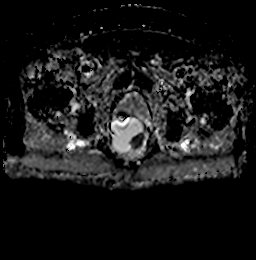
[im 33/42]
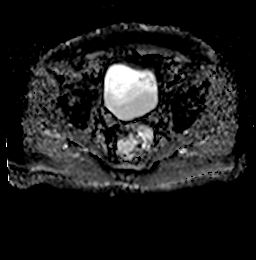
[im 42/42]
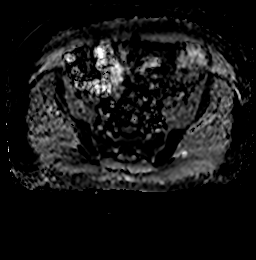

[46 of 48 positions shown; findings below may reference images not displayed]

FINDINGS: TUMOR LOCATION

Tumor distance from Anal Verge/Skin Surface: 13 cm on [DATE].

Tumor distance to Internal Anal Sphincter: 2.2 cm on [DATE]

TUMOR DESCRIPTION

Circumferential Extent: Approximately 180 degrees, centered about
the 4 o'clock position on [DATE]. Measures 4.7 x 2.3 cm on [DATE].

Tumor Length: 3.9 cm on [DATE].

T - CATEGORY

Extension through Muscularis Propria: Present. Measures maximally 4
mm on [DATE]. T3b

Shortest Distance of any tumor/node from Mesorectal Fascia: 8 mm on
[DATE].

Extramural Vascular Invasion/Tumor Thrombus: Absent

Invasion of Anterior Peritoneal Reflection: Absent

Involvement of Adjacent Organs or Pelvic Sidewall: No

Levator Ani Involvement: No

N - CATEGORY

Mesorectal Lymph Nodes >=5mm: None. There is a left-sided node at
the 3 o'clock position of 4 mm on [DATE]. Not pathologic by size
criteria.

Extra-mesorectal Lymphadenopathy: Absent

Other: No significant free fluid. Normal prostate. Tiny bilateral
fat containing inguinal hernias. No bowel obstruction. Normal
urinary bladder.
IMPRESSION: Rectal adenocarcinoma T stage: T3b

Rectal adenocarcinoma N stage:  0

Distance from tumor to the internal anal sphincter is 2.2 cm.

## 2022-07-24 DIAGNOSIS — Z932 Ileostomy status: Secondary | ICD-10-CM | POA: Diagnosis not present

## 2022-07-24 DIAGNOSIS — C20 Malignant neoplasm of rectum: Secondary | ICD-10-CM | POA: Diagnosis not present

## 2022-07-29 DIAGNOSIS — Z9889 Other specified postprocedural states: Secondary | ICD-10-CM | POA: Diagnosis not present

## 2022-07-30 ENCOUNTER — Telehealth: Payer: Self-pay

## 2022-07-30 DIAGNOSIS — K6389 Other specified diseases of intestine: Secondary | ICD-10-CM

## 2022-07-30 NOTE — Telephone Encounter (Signed)
-----   Message from Irving Copas., MD sent at 07/30/2022  9:08 AM EDT ----- AT, Thanks for sending this update and glad to hear that he is doing better. If you need me to try to look at the colon we certainly can make that happen. Just want to make sure my anatomy is correct he has a diversion ileostomy but we should be able to traverse through the ileostomy through the ICV and try to make way down into the descending colon to look at that particular area.  And you need a flex sig to look at the remnant rectum. Thanks. GM ----- Message ----- From: [name], MD Sent: 81/77/1165  12:04 PM EDT To: Timothy Lasso, RN; Irving Copas., MD; #  I saw him today and it does appear that he is finally healing.  I would like to get some visualization of the area.  Gabe, would you want to do an unprepped colonoscopy with him diverted?  If so, I just need some pics and evaluation of the anterior portion of his anastomosis.  There seems to be a flap of rectum there that may need to be resected for this to be somewhat functionable.    Elmo Putt ----- Message ----- From: Irving Copas., MD Sent: 07/07/2022  79:03 AM EDT To: [name], MD; Timothy Lasso, RN; #  PI, Thanks for reaching out. Why do not we let AT evaluate the patient in clinic and then let us know in regards to if she feels he is ready to move forward with colonoscopy since she just did the flex sig EUA. I will have him on a recall list and get his procedure done before the end of the year when she gives me the okay. I believe she is out of town until the latter portion of this month but I think that that is okay. Thanks. GM  Darlin Stenseth, Please place a patient recall for colonoscopy in the [facility] in November, pending Dr. Marcello Moores seen the patient. Thanks. GM ----- Message ----- From: [name], MD Sent: 07/07/2022   9:36 AM EDT To: Irving Copas., MD  Dr Rush Landmark,  Mr Cuevas is wondering if he is  due for another colonoscopy. His last scope was in July 2022  Thanks,

## 2022-07-30 NOTE — Telephone Encounter (Signed)
Mansouraty, Telford Nab., MD  [name], MD; Timothy Lasso, RN; [name], MD AT,  No worries.    Sirus Labrie,  Please reach out to the patient and schedule him for a unprepared colonoscopy for evaluation of his colon and his rectum.  No enemas.  Can be scheduled in a normal 30-minute slot in the [facility].  Please let Dr. Marcello Moores and Dr. Chryl Heck and I know when he has been scheduled.  Thanks.  GM        Previous Messages    ----- Message -----  From: [name], MD  Sent: 97/12/7183  12:15 PM EDT  To: Irving Copas., MD   Yeah, I think it'd probably be difficult for him to get the enema in the right place.     Elmo Putt  ----- Message -----  From: Irving Copas., MD  Sent: 07/30/2022  50:15 AM EDT  To: [name], MD   Understood.  Probably, want to minimize enemas as well to that area.  Correct?  ----- Message -----  From: [name], MD  Sent: 86/04/2573   9:26 AM EDT  To: Irving Copas., MD   Thanks Chester Holstein.  He's completely intact, so you can come from the rectum all the way around.  I just need to see what the distal anastomosis looks like for a functional evaluation.   AT

## 2022-08-04 NOTE — Telephone Encounter (Signed)
Left message on machine to call back  

## 2022-08-05 NOTE — Telephone Encounter (Signed)
The pt has been scheduled for unprepped colon in the [facility] on 12/13.  Referral has been entered I will also send a message to the pt via My Chart

## 2022-08-14 DIAGNOSIS — Z932 Ileostomy status: Secondary | ICD-10-CM | POA: Diagnosis not present

## 2022-08-14 DIAGNOSIS — C20 Malignant neoplasm of rectum: Secondary | ICD-10-CM | POA: Diagnosis not present

## 2022-08-26 DIAGNOSIS — C20 Malignant neoplasm of rectum: Secondary | ICD-10-CM | POA: Diagnosis not present

## 2022-09-01 DIAGNOSIS — C20 Malignant neoplasm of rectum: Secondary | ICD-10-CM | POA: Diagnosis not present

## 2022-09-01 DIAGNOSIS — Z932 Ileostomy status: Secondary | ICD-10-CM | POA: Diagnosis not present

## 2022-09-02 ENCOUNTER — Encounter: Payer: Self-pay | Admitting: Gastroenterology

## 2022-09-08 ENCOUNTER — Encounter: Payer: Self-pay | Admitting: Hematology and Oncology

## 2022-09-08 ENCOUNTER — Other Ambulatory Visit: Payer: Self-pay

## 2022-09-10 ENCOUNTER — Encounter: Payer: Self-pay | Admitting: Gastroenterology

## 2022-09-10 ENCOUNTER — Ambulatory Visit (AMBULATORY_SURGERY_CENTER): Payer: BC Managed Care – PPO | Admitting: Gastroenterology

## 2022-09-10 VITALS — BP 103/62 | HR 86 | Temp 98.6°F | Resp 11 | Ht 69.0 in | Wt 191.2 lb

## 2022-09-10 DIAGNOSIS — Z85048 Personal history of other malignant neoplasm of rectum, rectosigmoid junction, and anus: Secondary | ICD-10-CM

## 2022-09-10 DIAGNOSIS — K6289 Other specified diseases of anus and rectum: Secondary | ICD-10-CM

## 2022-09-10 DIAGNOSIS — Z08 Encounter for follow-up examination after completed treatment for malignant neoplasm: Secondary | ICD-10-CM | POA: Diagnosis not present

## 2022-09-10 DIAGNOSIS — K9189 Other postprocedural complications and disorders of digestive system: Secondary | ICD-10-CM | POA: Diagnosis not present

## 2022-09-10 DIAGNOSIS — K6389 Other specified diseases of intestine: Secondary | ICD-10-CM

## 2022-09-10 MED ORDER — SODIUM CHLORIDE 0.9 % IV SOLN: 500.0000 mL | INTRAVENOUS | Status: DC

## 2022-09-10 NOTE — Op Note (Signed)
[name] Procedure Date: 09/10/2022 10:01 AM MRN: [id number] Endoscopist: Justice Britain , MD, [id number] Age: 51 Referring MD:  Date of Birth: December 27, 1970 Gender: Male Account #: [id number] Procedure:                Colonoscopy Indications:              High risk colon cancer surveillance: Personal                            history of rectal cancer, Incidental - Monitoring                            for anastomotic recurrence of colon malignancy Medicines:                Monitored Anesthesia Care Procedure:                Pre-Anesthesia Assessment:                           - Prior to the procedure, a History and Physical                            was performed, and patient medications and                            allergies were reviewed. The patient's tolerance of                            previous anesthesia was also reviewed. The risks                            and benefits of the procedure and the sedation                            options and risks were discussed with the patient.                            All questions were answered, and informed consent                            was obtained. Prior Anticoagulants: The patient has                            taken no anticoagulant or antiplatelet agents. ASA                            Grade Assessment: II - A patient with mild systemic                            disease. After reviewing the risks and benefits,                            the patient was deemed in satisfactory condition to  undergo the procedure.                           After obtaining informed consent, the colonoscope                            was passed under direct vision. Throughout the                            procedure, the patient's blood pressure, pulse, and                            oxygen saturations were monitored continuously. The                            0441  [id number]-[id number] Slim SB Colonoscope was introduced                            through the anus and advanced to the the cecum,                            identified by appendiceal orifice and ileocecal                            valve. The colonoscopy was performed without                            difficulty. The patient tolerated the procedure.                            The quality of the bowel preparation was                            inadequate. The ileocecal valve, appendiceal                            orifice, and rectum were photographed. Scope In: 10:17:45 AM Scope Out: 10:33:00 AM Scope Withdrawal Time: 0 hours 11 minutes 1 second  Total Procedure Duration: 0 hours 15 minutes 15 seconds  Findings:                 The digital rectal exam findings include palpable                            rectal shelf and hemorrhoids.                           Copious quantities of semi-liquid semi-solid stool                            was found in the entire colon, precluding                            visualization. Lavage of the area was performed  using copious amounts, resulting in incomplete                            clearance with continued poor visualization.                           Normal mucosa was found in the recto-sigmoid colon                            however.                           There was evidence of a prior end-to-side                            low-anterior anastomosis in the distal rectum. This                            is at approximately 6-7 cm. This was patent and was                            characterized by erythema, friable mucosa,                            inflammation and ulceration. The anastomosis was                            traversed. Biopsies were taken with a cold forceps                            for histology. In the posterior portion of the                            rectal cavity, there appears to be 4 deep                             regions/potential previous fistulous connections.                            In the anterior portion of the rectal cavity there                            appears to be 2 regions of previous fistulous                            connections. Overt evidence of deep pelvic                            connection however is not present and I do not see                            evidence of overt dehisence. Complications:            No immediate complications. Estimated Blood Loss:  Estimated blood loss was minimal. Impression:               - Stool in the entire examined colon. Preparation                            of the colon was inadequate.                           - Palpable rectal shelf and hemorrhoids found on                            digital rectal exam.                           - Normal appearing mucosa in the recto-sigmoid                            colon.                           - Patent end-to-side low-anterior anastomosis                            (approximately 6-7 cm from anal os), characterized                            by erythema, friable mucosa, inflammation and                            ulceration. Biopsied. Posteriorly there are 4 areas                            of fistulous appearance but no deep pelvic abscess                            present. Anteriorly there are 2 areas of fistulous                            appearance but again no evidence of deep pelvic                            abscess present. Recommendation:           - The patient will be observed post-procedure,                            until all discharge criteria are met.                           - Discharge patient to home.                           - Patient has a contact number available for                            emergencies. The signs and symptoms of  potential                            delayed complications were discussed with the                             patient. Return to normal activities tomorrow.                            Written discharge instructions were provided to the                            patient.                           - Resume previous diet.                           - Will discuss case and results with Dr. Marcello Moores.                            She will come up with plans moving forward.                           - Colonoscopy for screening purposes cannot count                            today (since he is unprepared as he is in                            discontinuity.                           - The findings and recommendations were discussed                            with the patient.                           - The findings and recommendations were discussed                            with the patient's family. Justice Britain, MD 09/10/2022 10:43:53 AM

## 2022-09-10 NOTE — Progress Notes (Signed)
Called to room to assist during endoscopic procedure.  Patient ID and intended procedure confirmed with present staff. Received instructions for my participation in the procedure from the performing physician.  

## 2022-09-10 NOTE — Progress Notes (Unsigned)
GASTROENTEROLOGY PROCEDURE H&P NOTE   Primary Care Physician: Shirline Frees, MD  HPI: Steven [name] is a 51 y.o. male who presents for Colonoscopy for follow up of rectal cancer s/p treatment with end ileostomy after LAR here to see if possible ability to do redoanastomosis/ostomy.  Past Medical History:  Diagnosis Date   Anemia 02/2021   iron   Anxiety    Asthma    Cancer ([facility]) 02/2021   Depression    Diabetes mellitus without complication ([facility])    History of kidney stones    HLD (hyperlipidemia)    Hypertension    Kidney stones    Neuromuscular disorder ([facility])    from radiation bi lat   OSA (obstructive sleep apnea)    CPAP   Peripheral vascular disease ([facility])    DVT   Sleep apnea    Past Surgical History:  Procedure Laterality Date   APPENDECTOMY  1989   ag   COLONOSCOPY  03/05/2021   CYST EXCISION  2011   left neck   DIVERTING ILEOSTOMY N/A 07/17/2021   Procedure: DIVERTING ILEOSTOMY;  Surgeon: [name], MD;  Location: [facility];  Service: General;  Laterality: N/A;   FLEXIBLE SIGMOIDOSCOPY N/A 04/04/2022   Procedure: FLEXIBLE SIGMOIDOSCOPY WITH POSSIBLE DEBRIDEMENT;  Surgeon: [name], MD;  Location: [facility];  Service: General;  Laterality: N/A;   RECTAL EXAM UNDER ANESTHESIA N/A 04/04/2022   Procedure: ANAL EXAM UNDER ANESTHESIA;  Surgeon: [name], MD;  Location: [facility];  Service: General;  Laterality: N/A;   WISDOM TOOTH EXTRACTION     XI ROBOTIC ASSISTED LOWER ANTERIOR RESECTION N/A 07/17/2021   Procedure: XI ROBOTIC ASSISTED LOWER ANTERIOR RESECTION;  Surgeon: [name], MD;  Location: [facility];  Service: General;  Laterality: N/A;   Current Outpatient Medications  Medication Sig Dispense Refill   acetaminophen (TYLENOL) 500 MG tablet Take 1,000 mg by mouth every 6 (six) hours as needed for mild pain.     fenofibrate 160 MG tablet Take 160 mg by mouth daily.     gabapentin (NEURONTIN) 300 MG capsule  Take 1 capsule (300 mg total) by mouth at bedtime. 30 capsule 3   tetrahydrozoline-zinc (VISINE-AC) 0.05-0.25 % ophthalmic solution Place 2 drops into both eyes 4 (four) times daily as needed (allergy).     XIGDUO XR 01-999 MG TB24 Take 1 tablet by mouth 2 (two) times daily with a meal.     albuterol (PROAIR HFA) 108 (90 Base) MCG/ACT inhaler 2 puffs as needed     ALPRAZolam (XANAX) 0.25 MG tablet Take 0.25 mg by mouth daily as needed for anxiety or sleep.     blood glucose meter kit and supplies KIT Dispense based on patient and insurance preference. Use up to four times daily as directed. (FOR ICD-9 250.00, 250.01). 1 each 0   Cinnamon 500 MG capsule Take 500 mg by mouth daily with supper. (Patient not taking: Reported on 09/10/2022)     loperamide (IMODIUM) 2 MG capsule Take 2 capsules (4 mg total) by mouth 4 (four) times daily -  before meals and at bedtime. 30 capsule 0   ondansetron (ZOFRAN) 8 MG tablet Take 1 tablet (8 mg total) by mouth 2 (two) times daily as needed (Nausea or vomiting). 30 tablet 1   oxyCODONE (OXY IR/ROXICODONE) 5 MG immediate release tablet Take 1-2 tablets (5-10 mg total) by mouth every 6 (six) hours as needed for severe pain. (Patient not taking: Reported on 09/10/2022) 30 tablet  0   polycarbophil (FIBERCON) 625 MG tablet Take 1,250 mg by mouth in the morning and at bedtime.     predniSONE (DELTASONE) 50 MG tablet Take 1 every 6 hours starting 12 hours prior to scan and with last dose take 50 mg benadryl 3 tablet 1   prochlorperazine (COMPAZINE) 10 MG tablet Take 1 tablet (10 mg total) by mouth every 6 (six) hours as needed (Nausea or vomiting). (Patient not taking: Reported on 09/10/2022) 30 tablet 1   Current Facility-Administered Medications  Medication Dose Route Frequency Provider Last Rate Last Admin   0.9 %  sodium chloride infusion  500 mL Intravenous Continuous Mansouraty, Telford Nab., MD        Current Outpatient Medications:    acetaminophen (TYLENOL) 500  MG tablet, Take 1,000 mg by mouth every 6 (six) hours as needed for mild pain., Disp: , Rfl:    fenofibrate 160 MG tablet, Take 160 mg by mouth daily., Disp: , Rfl:    gabapentin (NEURONTIN) 300 MG capsule, Take 1 capsule (300 mg total) by mouth at bedtime., Disp: 30 capsule, Rfl: 3   tetrahydrozoline-zinc (VISINE-AC) 0.05-0.25 % ophthalmic solution, Place 2 drops into both eyes 4 (four) times daily as needed (allergy)., Disp: , Rfl:    XIGDUO XR 01-999 MG TB24, Take 1 tablet by mouth 2 (two) times daily with a meal., Disp: , Rfl:    albuterol (PROAIR HFA) 108 (90 Base) MCG/ACT inhaler, 2 puffs as needed, Disp: , Rfl:    ALPRAZolam (XANAX) 0.25 MG tablet, Take 0.25 mg by mouth daily as needed for anxiety or sleep., Disp: , Rfl:    blood glucose meter kit and supplies KIT, Dispense based on patient and insurance preference. Use up to four times daily as directed. (FOR ICD-9 250.00, 250.01)., Disp: 1 each, Rfl: 0   Cinnamon 500 MG capsule, Take 500 mg by mouth daily with supper. (Patient not taking: Reported on 09/10/2022), Disp: , Rfl:    loperamide (IMODIUM) 2 MG capsule, Take 2 capsules (4 mg total) by mouth 4 (four) times daily -  before meals and at bedtime., Disp: 30 capsule, Rfl: 0   ondansetron (ZOFRAN) 8 MG tablet, Take 1 tablet (8 mg total) by mouth 2 (two) times daily as needed (Nausea or vomiting)., Disp: 30 tablet, Rfl: 1   oxyCODONE (OXY IR/ROXICODONE) 5 MG immediate release tablet, Take 1-2 tablets (5-10 mg total) by mouth every 6 (six) hours as needed for severe pain. (Patient not taking: Reported on 09/10/2022), Disp: 30 tablet, Rfl: 0   polycarbophil (FIBERCON) 625 MG tablet, Take 1,250 mg by mouth in the morning and at bedtime., Disp: , Rfl:    predniSONE (DELTASONE) 50 MG tablet, Take 1 every 6 hours starting 12 hours prior to scan and with last dose take 50 mg benadryl, Disp: 3 tablet, Rfl: 1   prochlorperazine (COMPAZINE) 10 MG tablet, Take 1 tablet (10 mg total) by mouth every 6  (six) hours as needed (Nausea or vomiting). (Patient not taking: Reported on 09/10/2022), Disp: 30 tablet, Rfl: 1  Current Facility-Administered Medications:    0.9 %  sodium chloride infusion, 500 mL, Intravenous, Continuous, Mansouraty, Telford Nab., MD Allergies  Allergen Reactions   Contrast Media [Iodinated Contrast Media] Hives    As a 51 year old experienced hives and nausea   Family History  Problem Relation Age of Onset   Diabetes Mellitus II Mother    Breast cancer Mother 27   Thrombophlebitis Father    Breast cancer  Maternal Grandmother 4   Diabetes Maternal Grandmother    Breast cancer Maternal Aunt 60   Diabetes Maternal Aunt    Hypertension Paternal Grandmother    Colon cancer Neg Hx    Esophageal cancer Neg Hx    Inflammatory bowel disease Neg Hx    Liver disease Neg Hx    Pancreatic cancer Neg Hx    Rectal cancer Neg Hx    Stomach cancer Neg Hx    Social History   Socioeconomic History   Marital status: Married    Spouse name: Not on file   Number of children: 3   Years of education: Not on file   Highest education level: Not on file  Occupational History   Occupation: Gaffer  Tobacco Use   Smoking status: Never   Smokeless tobacco: Never  Vaping Use   Vaping Use: Never used  Substance and Sexual Activity   Alcohol use: Yes    Comment: 1 beer every 3-4 months   Drug use: Never   Sexual activity: Yes  Other Topics Concern   Not on file  Social History Narrative   Not on file   Social Determinants of Health   Financial Resource Strain: Not on file  Food Insecurity: Not on file  Transportation Needs: Not on file  Physical Activity: Not on file  Stress: Not on file  Social Connections: Not on file  Intimate Partner Violence: Not on file    Physical Exam: Today's Vitals   09/10/22 0929 09/10/22 0933  BP: 125/82   Pulse: 98   Temp: 98.6 F (37 C) 98.6 F (37 C)  SpO2: 100%   Weight: 191 lb 3.2 oz (86.7 kg)   Height: _0   (1.753 m)   PainSc:  1    Body mass index is 28.24 kg/m. GEN: NAD EYE: Sclerae anicteric ENT: MMM CV: Non-tachycardic GI: Soft, NT/ND NEURO:  Alert & Oriented x 3  Lab Results: No results for input(s): "WBC", "HGB", "HCT", "PLT" in the last 72 hours. BMET No results for input(s): "NA", "K", "CL", "CO2", "GLUCOSE", "BUN", "CREATININE", "CALCIUM" in the last 72 hours. LFT No results for input(s): "PROT", "ALBUMIN", "AST", "ALT", "ALKPHOS", "BILITOT", "BILIDIR", "IBILI" in the last 72 hours. PT/INR No results for input(s): "LABPROT", "INR" in the last 72 hours.   Impression / Plan: This is a 51 y.o.male who presents for Colonoscopy for follow up of rectal cancer s/p treatment with end ileostomy after LAR here to see if possible ability to do redoanastomosis/ostomy.  The risks and benefits of endoscopic evaluation/treatment were discussed with the patient and/or family; these include but are not limited to the risk of perforation, infection, bleeding, missed lesions, lack of diagnosis, severe illness requiring hospitalization, as well as anesthesia and sedation related illnesses.  The patient's history has been reviewed, patient examined, no change in status, and deemed stable for procedure.  The patient and/or family is agreeable to proceed.    Justice Britain, MD [facility] Office # [phone]

## 2022-09-10 NOTE — Patient Instructions (Signed)
Please read handouts provided. Resume previous diet. Dr. Rush Landmark will discuss case and results with Dr. Marcello Moores.  YOU HAD AN ENDOSCOPIC PROCEDURE TODAY AT [facility]:   Refer to the procedure report that was given to you for any specific questions about what was found during the examination.  If the procedure report does not answer your questions, please call your gastroenterologist to clarify.  If you requested that your care partner not be given the details of your procedure findings, then the procedure report has been included in a sealed envelope for you to review at your convenience later.  YOU SHOULD EXPECT: Some feelings of bloating in the abdomen. Passage of more gas than usual.  Walking can help get rid of the air that was put into your GI tract during the procedure and reduce the bloating. If you had a lower endoscopy (such as a colonoscopy or flexible sigmoidoscopy) you may notice spotting of blood in your stool or on the toilet paper. If you underwent a bowel prep for your procedure, you may not have a normal bowel movement for a few days.  Please Note:  You might notice some irritation and congestion in your nose or some drainage.  This is from the oxygen used during your procedure.  There is no need for concern and it should clear up in a day or so.  SYMPTOMS TO REPORT IMMEDIATELY:  Following lower endoscopy (colonoscopy or flexible sigmoidoscopy):  Excessive amounts of blood in the stool  Significant tenderness or worsening of abdominal pains  Swelling of the abdomen that is new, acute  Fever of 100F or higher   For urgent or emergent issues, a gastroenterologist can be reached at any hour by calling [phone]. Do not use MyChart messaging for urgent concerns.    DIET:  We do recommend a small meal at first, but then you may proceed to your regular diet.  Drink plenty of fluids but you should avoid alcoholic beverages for 24 hours.  ACTIVITY:  You  should plan to take it easy for the rest of today and you should NOT DRIVE or use heavy machinery until tomorrow (because of the sedation medicines used during the test).    FOLLOW UP: Our staff will call the number listed on your records the next business day following your procedure.  We will call around 7:15- 8:00 am to check on you and address any questions or concerns that you may have regarding the information given to you following your procedure. If we do not reach you, we will leave a message.     If any biopsies were taken you will be contacted by phone or by letter within the next 1-3 weeks.  Please call us at [phone] if you have not heard about the biopsies in 3 weeks.    SIGNATURES/CONFIDENTIALITY: You and/or your care partner have signed paperwork which will be entered into your electronic medical record.  These signatures attest to the fact that that the information above on your After Visit Summary has been reviewed and is understood.  Full responsibility of the confidentiality of this discharge information lies with you and/or your care-partner.

## 2022-09-10 NOTE — Progress Notes (Signed)
Report to PACU, RN, vss, BBS= Clear.  

## 2022-09-11 ENCOUNTER — Telehealth: Payer: Self-pay | Admitting: *Deleted

## 2022-09-11 ENCOUNTER — Encounter: Payer: Self-pay | Admitting: Gastroenterology

## 2022-09-11 NOTE — Telephone Encounter (Signed)
  Follow up Call-     09/10/2022    9:33 AM 03/05/2021    7:22 AM  Call back number  Post procedure Call Back phone  # [phone]  Permission to leave phone message Yes Yes   LMOM

## 2022-09-15 DIAGNOSIS — C20 Malignant neoplasm of rectum: Secondary | ICD-10-CM | POA: Diagnosis not present

## 2022-09-24 ENCOUNTER — Ambulatory Visit (HOSPITAL_COMMUNITY)
Admission: RE | Admit: 2022-09-24 | Discharge: 2022-09-24 | Disposition: A | Discharge status: Home / Self Care | Payer: BC Managed Care – PPO | Source: Ambulatory Visit | Attending: Hematology and Oncology | Admitting: Hematology and Oncology

## 2022-09-24 DIAGNOSIS — E0865 Diabetes mellitus due to underlying condition with hyperglycemia: Secondary | ICD-10-CM | POA: Diagnosis not present

## 2022-09-24 DIAGNOSIS — N200 Calculus of kidney: Secondary | ICD-10-CM | POA: Diagnosis not present

## 2022-09-24 DIAGNOSIS — K8689 Other specified diseases of pancreas: Secondary | ICD-10-CM | POA: Diagnosis not present

## 2022-09-24 DIAGNOSIS — C20 Malignant neoplasm of rectum: Secondary | ICD-10-CM | POA: Insufficient documentation

## 2022-09-24 DIAGNOSIS — K3189 Other diseases of stomach and duodenum: Secondary | ICD-10-CM | POA: Diagnosis not present

## 2022-09-24 LAB — POCT I-STAT CREATININE: Creatinine, Ser: 1.5 mg/dL — ABNORMAL HIGH (ref 0.61–1.24)

## 2022-09-24 MED ORDER — BARIUM SULFATE 2 % PO SUSP
900.0000 mL | Freq: Once | ORAL | Status: AC
  Administered 2022-09-24: 900 mL via ORAL

## 2022-09-24 MED ORDER — IOHEXOL 300 MG/ML  SOLN
100.0000 mL | Freq: Once | INTRAMUSCULAR | Status: AC | PRN
  Administered 2022-09-24: 100 mL via INTRAVENOUS

## 2022-09-24 MED ORDER — IOHEXOL 9 MG/ML PO SOLN
ORAL | Status: AC
  Filled 2022-09-24: qty 1000

## 2022-10-06 ENCOUNTER — Telehealth: Payer: Self-pay | Admitting: *Deleted

## 2022-10-06 ENCOUNTER — Other Ambulatory Visit: Payer: Self-pay | Admitting: *Deleted

## 2022-10-06 ENCOUNTER — Inpatient Hospital Stay: Payer: BC Managed Care – PPO | Attending: Hematology and Oncology | Admitting: Hematology and Oncology

## 2022-10-06 ENCOUNTER — Inpatient Hospital Stay: Payer: BC Managed Care – PPO

## 2022-10-06 ENCOUNTER — Encounter: Payer: Self-pay | Admitting: Hematology and Oncology

## 2022-10-06 VITALS — BP 156/78 | HR 98 | Temp 97.9°F | Resp 16 | Wt 198.5 lb

## 2022-10-06 DIAGNOSIS — C20 Malignant neoplasm of rectum: Secondary | ICD-10-CM

## 2022-10-06 DIAGNOSIS — Z923 Personal history of irradiation: Secondary | ICD-10-CM | POA: Insufficient documentation

## 2022-10-06 DIAGNOSIS — Z9221 Personal history of antineoplastic chemotherapy: Secondary | ICD-10-CM | POA: Diagnosis not present

## 2022-10-06 DIAGNOSIS — Z85048 Personal history of other malignant neoplasm of rectum, rectosigmoid junction, and anus: Secondary | ICD-10-CM | POA: Diagnosis not present

## 2022-10-06 DIAGNOSIS — M549 Dorsalgia, unspecified: Secondary | ICD-10-CM | POA: Insufficient documentation

## 2022-10-06 DIAGNOSIS — Z86718 Personal history of other venous thrombosis and embolism: Secondary | ICD-10-CM | POA: Diagnosis not present

## 2022-10-06 LAB — COMPREHENSIVE METABOLIC PANEL
ALT: 15 U/L (ref 0–44)
AST: 12 U/L — ABNORMAL LOW (ref 15–41)
Albumin: 4.3 g/dL (ref 3.5–5.0)
Alkaline Phosphatase: 83 U/L (ref 38–126)
Anion gap: 8 (ref 5–15)
BUN: 29 mg/dL — ABNORMAL HIGH (ref 6–20)
CO2: 24 mmol/L (ref 22–32)
Calcium: 9.9 mg/dL (ref 8.9–10.3)
Chloride: 101 mmol/L (ref 98–111)
Creatinine, Ser: 1.41 mg/dL — ABNORMAL HIGH (ref 0.61–1.24)
GFR, Estimated: >60 mL/min (ref >60)
Glucose, Bld: 211 mg/dL — ABNORMAL HIGH (ref 70–99)
Potassium: 4.5 mmol/L (ref 3.5–5.1)
Sodium: 133 mmol/L — ABNORMAL LOW (ref 135–145)
Total Bilirubin: 0.4 mg/dL (ref 0.3–1.2)
Total Protein: 6.7 g/dL (ref 6.5–8.1)

## 2022-10-06 LAB — CBC WITH DIFFERENTIAL/PLATELET
Abs Immature Granulocytes: 0.02 10*3/uL (ref 0.00–0.07)
Basophils Absolute: 0 10*3/uL (ref 0.0–0.1)
Basophils Relative: 0 %
Eosinophils Absolute: 0.2 10*3/uL (ref 0.0–0.5)
Eosinophils Relative: 3 %
HCT: 39.5 % (ref 39.0–52.0)
Hemoglobin: 13.2 g/dL (ref 13.0–17.0)
Immature Granulocytes: 0 %
Lymphocytes Relative: 26 %
Lymphs Abs: 1.8 10*3/uL (ref 0.7–4.0)
MCH: 25.7 pg — ABNORMAL LOW (ref 26.0–34.0)
MCHC: 33.4 g/dL (ref 30.0–36.0)
MCV: 77 fL — ABNORMAL LOW (ref 80.0–100.0)
Monocytes Absolute: 0.4 10*3/uL (ref 0.1–1.0)
Monocytes Relative: 6 %
Neutro Abs: 4.5 10*3/uL (ref 1.7–7.7)
Neutrophils Relative %: 65 %
Platelets: 215 10*3/uL (ref 150–400)
RBC: 5.13 MIL/uL (ref 4.22–5.81)
RDW: 17 % — ABNORMAL HIGH (ref 11.5–15.5)
WBC: 7 10*3/uL (ref 4.0–10.5)
nRBC: 0 % (ref 0.0–0.2)

## 2022-10-06 LAB — CEA (ACCESS): CEA (CHCC): 4.35 ng/mL (ref 0.00–5.00)

## 2022-10-06 NOTE — Assessment & Plan Note (Signed)
This is a very pleasant 52 year old male patient with newly diagnosed T3BN0 adenocarcinoma of the rectum, MSS with no evidence of distant metastasis following up with medical oncology for consideration of concurrent chemoradiation.   He has T3N0 or stage IIa adenocarcinoma of the rectum and treatment recommendations from TB suggested concurrent chemotherapy radiation with Xeloda versus 5-fluorouracil followed by consideration for surgery and adjuvant chemotherapy   Given concern for 4 mm perirectal lymph node which is not entirely diagnostic for involvement, we have also discussed total neoadjuvant approach which patient was reluctant given some issues with his work and disability leave.  He wanted to proceed with neoadjuvant chemoradiation followed by surgery and adjuvant chemotherapy because of some logistics with his disability leave.  He started Xeloda on 04/03/2021, last date of treatment 05/10/2021.  Treatment complicated by some mild fatigue and tenesmus otherwise no major complications. He had definitive resection by Dr. Marcello Moores on July 17, 2021.  Pathology showed complete response with no residual carcinoma.  He completed 6 cycles of adjuvant Xeloda.  He continues to improve, had a colonoscopy and CT imaging. No concern for recurrence, however imaging suggests? Sacral osteomyelitis. MR pelvis recommended ordered, no evidence of systemic infection Spoke to ID on call, recommended referral, placed. RTC in 2 weeks for televisit to review MR pelvis results CBC, CMP and CEA today He was advised to go to the nearest hospital with any fevers or chills or worsening back pain.  He will return to clinic in about 6 months in person.

## 2022-10-06 NOTE — Progress Notes (Signed)
[facility] Follow up:    Steven Frees, MD [address] [place] [address]   DIAGNOSIS:  Cancer Staging  Adenocarcinoma of rectum [facility]) Staging form: Colon and Rectum, AJCC 8th Edition - Clinical stage from 03/05/2021: Stage IIA (cT3, cN0, cM0) - Signed by Truitt Merle, MD on 07/29/2021 Stage prefix: Initial diagnosis Total positive nodes: 0 - Pathologic stage from 07/17/2021: ypT0, pN0, cM0 - Signed by Truitt Merle, MD on 07/29/2021 Stage prefix: Post-therapy Total positive nodes: 0 Residual tumor (R): R0 - None   SUMMARY OF ONCOLOGIC HISTORY:  He underwent CT AP 11/23/20 to rule out malignancy after PE. This showed asymmetric soft tissue in rectum. -colonoscopy 03/05/21 with Dr. Rush Landmark revealed a palpable rectal mass on digital rectal exam, measuring 5 cm on scope. Pathology confirmed adenocarcinoma. -staging CT CAP 03/14/21 showed questionable tiny perirectal lymph nodes, but negative node on staging pelvic MRI 03/15/21. -he received neoadjuvant concurrent chemoradiation 04/03/21-05/10/21. -definitive resection on 07/17/21 under Dr. Marcello Moores. Pathology showed complete response with no residual carcinoma. Lymph nodes and margins negative. -He saw Dr. Marcello Moores on 08/19/21 for increasing postoperative rectal pain and was found to have some anastomotic dehiscence. Because of the slow healing and lack of improvement from antibiotics, there was delay in starting Xeloda and he was finally able to start Xeloda on 09/09/2021.   He completed 6 cycles of adjuvant xeloda.  CURRENT THERAPY: Xeloda  INTERVAL HISTORY: Steven [name] 52 y.o. male returns for evaluation of his history of rectal cancer.    Patient is here for follow-up.  Since last visit, he continues to improve. He says Dr Marcello Moores didn't recommend ostomy reversal. He also has ongoing back pain but this has improved since debridement in the past 2/3 weeks. NO change in stool habits. No fevers or chills.  No change in breathing or bowel habits or urinary habits.  Rest of the pertinent 10 point ROS reviewed and negative.  Patient Active Problem List   Diagnosis Date Noted   Peripheral neuropathy due to chemotherapy ([place]) 07/07/2022   Cancer related pain 04/30/2021   Tenesmus 04/16/2021   Chemotherapy-induced nausea 04/03/2021   Adenocarcinoma of rectum ([place]) 03/21/2021   Colon cancer screening 12/26/2020   Abnormal finding on CT scan 12/26/2020   History of rectal bleeding 12/26/2020   Gastroesophageal reflux disease 12/26/2020   Diabetes mellitus due to underlying condition, uncontrolled, with hyperglycemia, without long-term current use of insulin ([place]) 04/26/2019   Obesity (BMI 30-39.9) 04/26/2019   Asthma    Anxiety    OSA (obstructive sleep apnea)    HLD (hyperlipidemia)    Bilateral pulmonary embolism ([place]) 04/24/2019    is allergic to contrast media [iodinated contrast media].  MEDICAL HISTORY: Past Medical History:  Diagnosis Date   Anemia 02/2021   iron   Anxiety    Asthma    Cancer ([place]) 02/2021   Depression    Diabetes mellitus without complication ([place])    History of kidney stones    HLD (hyperlipidemia)    Hypertension    Kidney stones    Neuromuscular disorder ([facility])    from radiation bi lat   OSA (obstructive sleep apnea)    CPAP   Peripheral vascular disease ([place])    DVT   Sleep apnea     SURGICAL HISTORY: Past Surgical History:  Procedure Laterality Date   APPENDECTOMY  1989   ag   COLONOSCOPY  03/05/2021   CYST EXCISION  2011   left neck  DIVERTING ILEOSTOMY N/A 07/17/2021   Procedure: DIVERTING ILEOSTOMY;  Surgeon: [name], MD;  Location: [facility];  Service: General;  Laterality: N/A;   FLEXIBLE SIGMOIDOSCOPY N/A 04/04/2022   Procedure: FLEXIBLE SIGMOIDOSCOPY WITH POSSIBLE DEBRIDEMENT;  Surgeon: [name], MD;  Location: [facility];  Service: General;  Laterality: N/A;   RECTAL EXAM UNDER ANESTHESIA N/A 04/04/2022    Procedure: ANAL EXAM UNDER ANESTHESIA;  Surgeon: [name], MD;  Location: [facility];  Service: General;  Laterality: N/A;   WISDOM TOOTH EXTRACTION     XI ROBOTIC ASSISTED LOWER ANTERIOR RESECTION N/A 07/17/2021   Procedure: XI ROBOTIC ASSISTED LOWER ANTERIOR RESECTION;  Surgeon: [name], MD;  Location: [facility];  Service: General;  Laterality: N/A;    SOCIAL HISTORY: Social History   Socioeconomic History   Marital status: Married    Spouse name: Not on file   Number of children: 3   Years of education: Not on file   Highest education level: Not on file  Occupational History   Occupation: Gaffer  Tobacco Use   Smoking status: Never   Smokeless tobacco: Never  Vaping Use   Vaping Use: Never used  Substance and Sexual Activity   Alcohol use: Yes    Comment: 1 beer every 3-4 months   Drug use: Never   Sexual activity: Yes  Other Topics Concern   Not on file  Social History Narrative   Not on file   Social Determinants of Health   Financial Resource Strain: Not on file  Food Insecurity: Not on file  Transportation Needs: Not on file  Physical Activity: Not on file  Stress: Not on file  Social Connections: Not on file  Intimate Partner Violence: Not on file    FAMILY HISTORY: Family History  Problem Relation Age of Onset   Diabetes Mellitus II Mother    Breast cancer Mother 72   Thrombophlebitis Father    Breast cancer Maternal Grandmother 26   Diabetes Maternal Grandmother    Breast cancer Maternal Aunt 60   Diabetes Maternal Aunt    Hypertension Paternal Grandmother    Colon cancer Neg Hx    Esophageal cancer Neg Hx    Inflammatory bowel disease Neg Hx    Liver disease Neg Hx    Pancreatic cancer Neg Hx    Rectal cancer Neg Hx    Stomach cancer Neg Hx     Review of Systems  Constitutional:  Negative for appetite change, chills, fatigue, fever and unexpected weight change.  HENT:   Negative for hearing loss,  lump/mass and trouble swallowing.   Eyes:  Negative for eye problems and icterus.  Respiratory:  Negative for chest tightness, cough and shortness of breath.   Cardiovascular:  Negative for chest pain, leg swelling and palpitations.  Gastrointestinal:  Negative for abdominal distention, abdominal pain, constipation, diarrhea, nausea and vomiting.  Endocrine: Negative for hot flashes.  Genitourinary:  Negative for difficulty urinating.   Musculoskeletal:  Negative for arthralgias.  Skin:  Negative for itching and rash.  Neurological:  Positive for numbness. Negative for dizziness, extremity weakness and headaches.  Hematological:  Negative for adenopathy. Does not bruise/bleed easily.  Psychiatric/Behavioral:  Negative for depression. The patient is not nervous/anxious.       PHYSICAL EXAMINATION  ECOG PERFORMANCE STATUS: 1 - Symptomatic but completely ambulatory  Vitals:   10/06/22 0952  BP: (!) 156/78  Pulse: 98  Resp: 16  Temp: 97.9 F (36.6 C)  SpO2:  100%    Physical Exam Constitutional:      General: He is not in acute distress.    Appearance: Normal appearance. He is not toxic-appearing.  HENT:     Head: Normocephalic and atraumatic.  Eyes:     General: No scleral icterus. Cardiovascular:     Rate and Rhythm: Normal rate and regular rhythm.     Pulses: Normal pulses.     Heart sounds: Normal heart sounds.  Pulmonary:     Effort: Pulmonary effort is normal.     Breath sounds: Normal breath sounds.  Abdominal:     General: Abdomen is flat. Bowel sounds are normal. There is no distension.     Palpations: Abdomen is soft.     Tenderness: There is no abdominal tenderness.     Comments: Ostomy bag  Musculoskeletal:        General: No swelling.     Cervical back: Neck supple.  Lymphadenopathy:     Cervical: No cervical adenopathy.  Skin:    General: Skin is warm and dry.     Findings: No rash.  Neurological:     General: No focal deficit present.     Mental  Status: He is alert.  Psychiatric:        Mood and Affect: Mood normal.        Behavior: Behavior normal.     LABORATORY DATA:  CBC    Component Value Date/Time   WBC 7.0 10/06/2022 1018   RBC 5.13 10/06/2022 1018   HGB 13.2 10/06/2022 1018   HGB 11.7 (L) 12/05/2021 0956   HCT 39.5 10/06/2022 1018   PLT 215 10/06/2022 1018   PLT 274 12/05/2021 0956   MCV 77.0 (L) 10/06/2022 1018   MCH 25.7 (L) 10/06/2022 1018   MCHC 33.4 10/06/2022 1018   RDW 17.0 (H) 10/06/2022 1018   LYMPHSABS 1.8 10/06/2022 1018   MONOABS 0.4 10/06/2022 1018   EOSABS 0.2 10/06/2022 1018   BASOSABS 0.0 10/06/2022 1018    CMP     Component Value Date/Time   NA 133 (L) 10/06/2022 1018   K 4.5 10/06/2022 1018   CL 101 10/06/2022 1018   CO2 24 10/06/2022 1018   GLUCOSE 211 (H) 10/06/2022 1018   BUN 29 (H) 10/06/2022 1018   CREATININE 1.41 (H) 10/06/2022 1018   CREATININE 0.93 12/05/2021 0956   CALCIUM 9.9 10/06/2022 1018   PROT 6.7 10/06/2022 1018   ALBUMIN 4.3 10/06/2022 1018   AST 12 (L) 10/06/2022 1018   AST 11 (L) 12/05/2021 0956   ALT 15 10/06/2022 1018   ALT 14 12/05/2021 0956   ALKPHOS 83 10/06/2022 1018   BILITOT 0.4 10/06/2022 1018   BILITOT 0.5 12/05/2021 0956   GFRNONAA >60 10/06/2022 1018   GFRNONAA >60 12/05/2021 0956   GFRAA >60 04/25/2019 0255        ASSESSMENT and THERAPY PLAN:   Adenocarcinoma of rectum ([place]) This is a very pleasant 52 year old male patient with newly diagnosed T3BN0 adenocarcinoma of the rectum, MSS with no evidence of distant metastasis following up with medical oncology for consideration of concurrent chemoradiation.   He has T3N0 or stage IIa adenocarcinoma of the rectum and treatment recommendations from TB suggested concurrent chemotherapy radiation with Xeloda versus 5-fluorouracil followed by consideration for surgery and adjuvant chemotherapy   Given concern for 4 mm perirectal lymph node which is not entirely diagnostic for involvement, we  have also discussed total neoadjuvant approach which patient was reluctant given some issues  with his work and disability leave.  He wanted to proceed with neoadjuvant chemoradiation followed by surgery and adjuvant chemotherapy because of some logistics with his disability leave.  He started Xeloda on 04/03/2021, last date of treatment 05/10/2021.  Treatment complicated by some mild fatigue and tenesmus otherwise no major complications. He had definitive resection by Dr. Marcello Moores on July 17, 2021.  Pathology showed complete response with no residual carcinoma.  He completed 6 cycles of adjuvant Xeloda.  He continues to improve, had a colonoscopy and CT imaging. No concern for recurrence, however imaging suggests? Sacral osteomyelitis. MR pelvis recommended ordered, no evidence of systemic infection Spoke to ID on call, recommended referral, placed. RTC in 2 weeks for televisit to review MR pelvis results CBC, CMP and CEA today He was advised to go to the nearest hospital with any fevers or chills or worsening back pain.  He will return to clinic in about 6 months in person.   All questions were answered. The patient knows to call the clinic with any problems, questions or concerns. We can certainly see the patient much sooner if necessary.  Total encounter time: 40 minutes in face-to-face visit time, chart review, lab review, care coordination, order entry, and documentation of the encounter.   *Total Encounter Time as defined by the Centers for Medicare and Medicaid Services includes, in addition to the face-to-face time of a patient visit (documented in the note above) non-face-to-face time: obtaining and reviewing outside history, ordering and reviewing medications, tests or procedures, care coordination (communications with other health care professionals or caregivers) and documentation in the medical record.

## 2022-10-06 NOTE — Telephone Encounter (Signed)
Called pt to make aware of MRI appt. Advised npo 4 hours prior to appt as well as arrival at 7:30 am for appt at 8am. Also, advised pt about elevated creatinine levels, per Dr.Iruku, pt needs to f/u with endocrinologist and hydrate. Pt verbalized understanding.

## 2022-10-13 ENCOUNTER — Ambulatory Visit (HOSPITAL_COMMUNITY)
Admission: RE | Admit: 2022-10-13 | Discharge: 2022-10-13 | Disposition: A | Discharge status: Home / Self Care | Payer: BC Managed Care – PPO | Source: Ambulatory Visit | Attending: Hematology and Oncology | Admitting: Hematology and Oncology

## 2022-10-13 DIAGNOSIS — C20 Malignant neoplasm of rectum: Secondary | ICD-10-CM | POA: Insufficient documentation

## 2022-10-13 DIAGNOSIS — N3289 Other specified disorders of bladder: Secondary | ICD-10-CM | POA: Diagnosis not present

## 2022-10-13 DIAGNOSIS — M439 Deforming dorsopathy, unspecified: Secondary | ICD-10-CM | POA: Diagnosis not present

## 2022-10-13 DIAGNOSIS — Z85048 Personal history of other malignant neoplasm of rectum, rectosigmoid junction, and anus: Secondary | ICD-10-CM | POA: Diagnosis not present

## 2022-10-13 DIAGNOSIS — R222 Localized swelling, mass and lump, trunk: Secondary | ICD-10-CM | POA: Diagnosis not present

## 2022-10-13 MED ORDER — GADOBUTROL 1 MMOL/ML IV SOLN
9.0000 mL | Freq: Once | INTRAVENOUS | Status: AC | PRN
  Administered 2022-10-13: 9 mL via INTRAVENOUS

## 2022-10-15 ENCOUNTER — Ambulatory Visit: Payer: BC Managed Care – PPO | Admitting: Internal Medicine

## 2022-10-15 ENCOUNTER — Telehealth: Payer: Self-pay | Admitting: Internal Medicine

## 2022-10-15 NOTE — Telephone Encounter (Signed)
Steven [name] was referred for treatment of osteomyelitis from initial CT scan findings.  Follow up MRI for confirmation is negative for osteomyelitis so appointment canceled and patient and referring physician notified.   Thayer Headings, MD

## 2022-10-16 ENCOUNTER — Telehealth: Payer: Self-pay | Admitting: *Deleted

## 2022-10-16 NOTE — Telephone Encounter (Signed)
-----  Message from [name], MD sent at 10/15/2022  5:10 PM EST ----- Mri shows no infection.  Thanks,

## 2022-10-16 NOTE — Telephone Encounter (Signed)
Per Dr.Iruku, called pt with message below. Pt verbalized understanding and advised to f/u as scheduled

## 2022-10-21 ENCOUNTER — Encounter: Payer: Self-pay | Admitting: Hematology and Oncology

## 2022-10-21 ENCOUNTER — Inpatient Hospital Stay (HOSPITAL_BASED_OUTPATIENT_CLINIC_OR_DEPARTMENT_OTHER): Payer: BC Managed Care – PPO | Admitting: Hematology and Oncology

## 2022-10-21 DIAGNOSIS — C20 Malignant neoplasm of rectum: Secondary | ICD-10-CM | POA: Diagnosis not present

## 2022-10-21 NOTE — Assessment & Plan Note (Signed)
This is a very pleasant 52 year old male patient with newly diagnosed T3BN0 adenocarcinoma of the rectum, MSS with no evidence of distant metastasis following up with medical oncology for consideration of concurrent chemoradiation.   He has T3N0 or stage IIa adenocarcinoma of the rectum and treatment recommendations from TB suggested concurrent chemotherapy radiation with Xeloda versus 5-fluorouracil followed by consideration for surgery and adjuvant chemotherapy   Given concern for 4 mm perirectal lymph node which is not entirely diagnostic for involvement, we have also discussed total neoadjuvant approach which patient was reluctant given some issues with his work and disability leave.  He wanted to proceed with neoadjuvant chemoradiation followed by surgery and adjuvant chemotherapy because of some logistics with his disability leave.  He started Xeloda on 04/03/2021, last date of treatment 05/10/2021.  Treatment complicated by some mild fatigue and tenesmus otherwise no major complications. He had definitive resection by Dr. Marcello Moores on July 17, 2021.  Pathology showed complete response with no residual carcinoma.  He completed 6 cycles of adjuvant Xeloda.  He continues to improve, had a colonoscopy and CT imaging. No concern for recurrence, however imaging suggests? Sacral osteomyelitis. MR pelvis recommended ordered, no evidence of systemic infection MRI with no evidence of osteomyelitis. ID referral cancelled. CBC, CMP and CEA reviewed with patient. He is aware of minimizing the use of NSAID's. RTC in about 3/4 months for repeat labs and imaging in 6 months.  [name] MD

## 2022-10-21 NOTE — Progress Notes (Signed)
[facility] Follow up:    Shirline Frees, MD [address] [place] [address]   DIAGNOSIS:  Cancer Staging  Adenocarcinoma of rectum [facility]) Staging form: Colon and Rectum, AJCC 8th Edition - Clinical stage from 03/05/2021: Stage IIA (cT3, cN0, cM0) - Signed by Truitt Merle, MD on 07/29/2021 Stage prefix: Initial diagnosis Total positive nodes: 0 - Pathologic stage from 07/17/2021: ypT0, pN0, cM0 - Signed by Truitt Merle, MD on 07/29/2021 Stage prefix: Post-therapy Total positive nodes: 0 Residual tumor (R): R0 - None   SUMMARY OF ONCOLOGIC HISTORY:  He underwent CT AP 11/23/20 to rule out malignancy after PE. This showed asymmetric soft tissue in rectum. -colonoscopy 03/05/21 with Dr. Rush Landmark revealed a palpable rectal mass on digital rectal exam, measuring 5 cm on scope. Pathology confirmed adenocarcinoma. -staging CT CAP 03/14/21 showed questionable tiny perirectal lymph nodes, but negative node on staging pelvic MRI 03/15/21. -he received neoadjuvant concurrent chemoradiation 04/03/21-05/10/21. -definitive resection on 07/17/21 under Dr. Marcello Moores. Pathology showed complete response with no residual carcinoma. Lymph nodes and margins negative. -He saw Dr. Marcello Moores on 08/19/21 for increasing postoperative rectal pain and was found to have some anastomotic dehiscence. Because of the slow healing and lack of improvement from antibiotics, there was delay in starting Xeloda and he was finally able to start Xeloda on 09/09/2021.   He completed 6 cycles of adjuvant xeloda.  CURRENT THERAPY: Xeloda  INTERVAL HISTORY: Steven [name] 52 y.o. male returns for evaluation of his history of rectal cancer.    Patient is here for telephone follow-up.  Since last visit, he says the pain is doing better. He was taking Ibuprofen for pain, but cut down on its use since he heard about the AKI. He saw his MRI results on the my chart space. Rest of the pertinent 10 point ROS  reviewed and negative.  Patient Active Problem List   Diagnosis Date Noted   Peripheral neuropathy due to chemotherapy ([place]) 07/07/2022   Cancer related pain 04/30/2021   Tenesmus 04/16/2021   Chemotherapy-induced nausea 04/03/2021   Adenocarcinoma of rectum ([place]) 03/21/2021   Colon cancer screening 12/26/2020   Abnormal finding on CT scan 12/26/2020   History of rectal bleeding 12/26/2020   Gastroesophageal reflux disease 12/26/2020   Diabetes mellitus due to underlying condition, uncontrolled, with hyperglycemia, without long-term current use of insulin ([place]) 04/26/2019   Obesity (BMI 30-39.9) 04/26/2019   Asthma    Anxiety    OSA (obstructive sleep apnea)    HLD (hyperlipidemia)    Bilateral pulmonary embolism ([place]) 04/24/2019    is allergic to contrast media [iodinated contrast media].  MEDICAL HISTORY: Past Medical History:  Diagnosis Date   Anemia 02/2021   iron   Anxiety    Asthma    Cancer ([place]) 02/2021   Depression    Diabetes mellitus without complication ([place])    History of kidney stones    HLD (hyperlipidemia)    Hypertension    Kidney stones    Neuromuscular disorder (H[facility])    from radiation bi lat   OSA (obstructive sleep apnea)    CPAP   Peripheral vascular disease ([place])    DVT   Sleep apnea     SURGICAL HISTORY: Past Surgical History:  Procedure Laterality Date   APPENDECTOMY  1989   ag   COLONOSCOPY  03/05/2021   CYST EXCISION  2011   left neck   DIVERTING ILEOSTOMY N/A 07/17/2021   Procedure: DIVERTING ILEOSTOMY;  Surgeon:  [name], MD;  Location: [facility];  Service: General;  Laterality: N/A;   FLEXIBLE SIGMOIDOSCOPY N/A 04/04/2022   Procedure: FLEXIBLE SIGMOIDOSCOPY WITH POSSIBLE DEBRIDEMENT;  Surgeon: [name], MD;  Location: [facility];  Service: General;  Laterality: N/A;   RECTAL EXAM UNDER ANESTHESIA N/A 04/04/2022   Procedure: ANAL EXAM UNDER ANESTHESIA;  Surgeon: [name], MD;  Location: [facility];  Service: General;  Laterality: N/A;   WISDOM TOOTH EXTRACTION     XI ROBOTIC ASSISTED LOWER ANTERIOR RESECTION N/A 07/17/2021   Procedure: XI ROBOTIC ASSISTED LOWER ANTERIOR RESECTION;  Surgeon: [name], MD;  Location: [facility];  Service: General;  Laterality: N/A;    SOCIAL HISTORY: Social History   Socioeconomic History   Marital status: Married    Spouse name: Not on file   Number of children: 3   Years of education: Not on file   Highest education level: Not on file  Occupational History   Occupation: Gaffer  Tobacco Use   Smoking status: Never   Smokeless tobacco: Never  Vaping Use   Vaping Use: Never used  Substance and Sexual Activity   Alcohol use: Yes    Comment: 1 beer every 3-4 months   Drug use: Never   Sexual activity: Yes  Other Topics Concern   Not on file  Social History Narrative   Not on file   Social Determinants of Health   Financial Resource Strain: Not on file  Food Insecurity: Not on file  Transportation Needs: Not on file  Physical Activity: Not on file  Stress: Not on file  Social Connections: Not on file  Intimate Partner Violence: Not on file    FAMILY HISTORY: Family History  Problem Relation Age of Onset   Diabetes Mellitus II Mother    Breast cancer Mother 75   Thrombophlebitis Father    Breast cancer Maternal Grandmother 71   Diabetes Maternal Grandmother    Breast cancer Maternal Aunt 60   Diabetes Maternal Aunt    Hypertension Paternal Grandmother    Colon cancer Neg Hx    Esophageal cancer Neg Hx    Inflammatory bowel disease Neg Hx    Liver disease Neg Hx    Pancreatic cancer Neg Hx    Rectal cancer Neg Hx    Stomach cancer Neg Hx     Review of Systems  Constitutional:  Negative for appetite change, chills, fatigue, fever and unexpected weight change.  HENT:   Negative for hearing loss, lump/mass and trouble swallowing.   Eyes:  Negative for eye problems and icterus.  Respiratory:   Negative for chest tightness, cough and shortness of breath.   Cardiovascular:  Negative for chest pain, leg swelling and palpitations.  Gastrointestinal:  Negative for abdominal distention, abdominal pain, constipation, diarrhea, nausea and vomiting.  Endocrine: Negative for hot flashes.  Genitourinary:  Negative for difficulty urinating.   Musculoskeletal:  Negative for arthralgias.  Skin:  Negative for itching and rash.  Neurological:  Positive for numbness. Negative for dizziness, extremity weakness and headaches.  Hematological:  Negative for adenopathy. Does not bruise/bleed easily.  Psychiatric/Behavioral:  Negative for depression. The patient is not nervous/anxious.       PHYSICAL EXAMINATION  ECOG PERFORMANCE STATUS: 1 - Symptomatic but completely ambulatory  There were no vitals filed for this visit.  PE not done, telephone visit.  LABORATORY DATA:  CBC    Component Value Date/Time   WBC 7.0 10/06/2022 1018   RBC  5.13 10/06/2022 1018   HGB 13.2 10/06/2022 1018   HGB 11.7 (L) 12/05/2021 0956   HCT 39.5 10/06/2022 1018   PLT 215 10/06/2022 1018   PLT 274 12/05/2021 0956   MCV 77.0 (L) 10/06/2022 1018   MCH 25.7 (L) 10/06/2022 1018   MCHC 33.4 10/06/2022 1018   RDW 17.0 (H) 10/06/2022 1018   LYMPHSABS 1.8 10/06/2022 1018   MONOABS 0.4 10/06/2022 1018   EOSABS 0.2 10/06/2022 1018   BASOSABS 0.0 10/06/2022 1018    CMP     Component Value Date/Time   NA 133 (L) 10/06/2022 1018   K 4.5 10/06/2022 1018   CL 101 10/06/2022 1018   CO2 24 10/06/2022 1018   GLUCOSE 211 (H) 10/06/2022 1018   BUN 29 (H) 10/06/2022 1018   CREATININE 1.41 (H) 10/06/2022 1018   CREATININE 0.93 12/05/2021 0956   CALCIUM 9.9 10/06/2022 1018   PROT 6.7 10/06/2022 1018   ALBUMIN 4.3 10/06/2022 1018   AST 12 (L) 10/06/2022 1018   AST 11 (L) 12/05/2021 0956   ALT 15 10/06/2022 1018   ALT 14 12/05/2021 0956   ALKPHOS 83 10/06/2022 1018   BILITOT 0.4 10/06/2022 1018   BILITOT 0.5  12/05/2021 0956   GFRNONAA >60 10/06/2022 1018   GFRNONAA >60 12/05/2021 0956   GFRAA >60 04/25/2019 0255        ASSESSMENT and THERAPY PLAN:   Adenocarcinoma of rectum ([place]) This is a very pleasant 52 year old male patient with newly diagnosed T3BN0 adenocarcinoma of the rectum, MSS with no evidence of distant metastasis following up with medical oncology for consideration of concurrent chemoradiation.   He has T3N0 or stage IIa adenocarcinoma of the rectum and treatment recommendations from TB suggested concurrent chemotherapy radiation with Xeloda versus 5-fluorouracil followed by consideration for surgery and adjuvant chemotherapy   Given concern for 4 mm perirectal lymph node which is not entirely diagnostic for involvement, we have also discussed total neoadjuvant approach which patient was reluctant given some issues with his work and disability leave.  He wanted to proceed with neoadjuvant chemoradiation followed by surgery and adjuvant chemotherapy because of some logistics with his disability leave.  He started Xeloda on 04/03/2021, last date of treatment 05/10/2021.  Treatment complicated by some mild fatigue and tenesmus otherwise no major complications. He had definitive resection by Dr. Marcello Moores on July 17, 2021.  Pathology showed complete response with no residual carcinoma.  He completed 6 cycles of adjuvant Xeloda.  He continues to improve, had a colonoscopy and CT imaging. No concern for recurrence, however imaging suggests? Sacral osteomyelitis. MR pelvis recommended ordered, no evidence of systemic infection MRI with no evidence of osteomyelitis. ID referral cancelled. CBC, CMP and CEA reviewed with patient. He is aware of minimizing the use of NSAID's. RTC in about 3/4 months for repeat labs and imaging in 6 months.  [name] MD   All questions were answered. The patient knows to call the clinic with any problems, questions or concerns. We can certainly see the  patient much sooner if necessary.  Total encounter time: 12 minutes in face-to-face visit time, chart review, lab review, care coordination, order entry, and documentation of the encounter.  I connected with  Javel Hersh on 10/21/22 by a telephone application and verified that I am speaking with the correct person using two identifiers.   I discussed the limitations of evaluation and management by telemedicine. The patient expressed understanding and agreed to proceed.    *Total Encounter Time as  defined by the Centers for Medicare and Medicaid Services includes, in addition to the face-to-face time of a patient visit (documented in the note above) non-face-to-face time: obtaining and reviewing outside history, ordering and reviewing medications, tests or procedures, care coordination (communications with other health care professionals or caregivers) and documentation in the medical record.

## 2022-11-04 ENCOUNTER — Other Ambulatory Visit: Payer: Self-pay | Admitting: Hematology and Oncology

## 2022-12-11 ENCOUNTER — Ambulatory Visit: Payer: Self-pay | Admitting: General Surgery

## 2022-12-11 NOTE — Progress Notes (Signed)
Surgery orders requested via Epic inbox. °

## 2022-12-16 ENCOUNTER — Telehealth: Payer: Self-pay | Admitting: Hematology and Oncology

## 2022-12-16 NOTE — Patient Instructions (Signed)
DUE TO COVID-19 ONLY TWO VISITORS  (aged 52 and older)  ARE ALLOWED TO COME WITH YOU AND STAY IN THE WAITING ROOM ONLY DURING PRE OP AND PROCEDURE.   **NO VISITORS ARE ALLOWED IN THE SHORT STAY AREA OR RECOVERY ROOM!!**  IF YOU WILL BE ADMITTED INTO THE HOSPITAL YOU ARE ALLOWED ONLY FOUR SUPPORT PEOPLE DURING VISITATION HOURS ONLY (7 AM -8PM)   The support person(s) must pass our screening, gel in and out, and wear a mask at all times, including in the patient's room. Patients must also wear a mask when staff or their support person are in the room. Visitors GUEST BADGE MUST BE WORN VISIBLY  One adult visitor may remain with you overnight and MUST be in the room by 8 P.M.     Your procedure is scheduled on: 12/31/22   Report to [facility] Main Entrance    Report to admitting at  6:15 AM   Call this number if you have problems the morning of surgery [phone]   Do not eat food :After Midnight.   After Midnight you may have the following liquids until _5:30_ AM/  DAY OF SURGERY  Water Black Coffee (sugar ok, NO MILK/CREAM OR CREAMERS)  Tea (sugar ok, NO MILK/CREAM OR CREAMERS) regular and decaf                             Plain Jell-O (NO RED)                                           Fruit ices (not with fruit pulp, NO RED)                                     Popsicles (NO RED)                                                                  Juice: apple, WHITE grape, WHITE cranberry Sports drinks like Gatorade (NO RED)               Drink 2 Ensure/G2 drinks AT 10:00 PM the night before surgery.        The day of surgery:  Drink ONE (1) Pre-Surgery Clear Ensure  at 5:00  AM the morning of surgery. Drink in one sitting. Do not sip.  This drink was given to you during your hospital  pre-op appointment visit. Nothing else to drink after completing the  Pre-Surgery Clear Ensure at 5:30 AM          If you have questions, please contact your surgeon's  office.   FOLLOW BOWEL PREP AND ANY ADDITIONAL PRE OP INSTRUCTIONS YOU RECEIVED FROM YOUR SURGEON'S OFFICE!!!  Drink plenty of fluids to prevent dehydration   Oral Hygiene is also important to reduce your risk of infection.                                    Remember -  BRUSH YOUR TEETH THE MORNING OF SURGERY WITH YOUR REGULAR TOOTHPASTE  DENTURES WILL BE REMOVED PRIOR TO SURGERY PLEASE DO NOT APPLY "Poly grip" OR ADHESIVES!!!   Do NOT smoke after Midnight   Take these medicines the morning of surgery with A SIP OF WATER: Use your inhaler and bring it with you                                                                                                                           Fenofibrate  Hold your Xigduo XR for 3 days prior to day of surgery. Last dose will be 12/27/22 Bring CPAP mask and tubing day of surgery.                              You may not have any metal on your body including  jewelry, and body piercing             Do not wear lotions, powders, cologne, or deodorant                 Men may shave face and neck.   Do not bring valuables to the hospital. [place].   Contacts, glasses, or bridgework may not be worn into surgery.   Bring small overnight bag day of surgery.   DO NOT [place]. PHARMACY WILL DISPENSE MEDICATIONS LISTED ON YOUR MEDICATION LIST TO YOU DURING YOUR ADMISSION [place]!       Special Instructions: Bring a copy of your healthcare power of attorney and living will documents  the day of surgery if you haven't scanned them before.              Please read over the following fact sheets you were given: IF YOU HAVE QUESTIONS ABOUT YOUR PRE-OP INSTRUCTIONS PLEASE CALL [phone]    [facility] - Preparing for Surgery Before surgery, you can play an important role.  Because skin is not sterile, your skin needs to be as free of germs as possible.  You  can reduce the number of germs on your skin by washing with CHG (chlorahexidine gluconate) soap before surgery.  CHG is an antiseptic cleaner which kills germs and bonds with the skin to continue killing germs even after washing. Please DO NOT use if you have an allergy to CHG or antibacterial soaps.  If your skin becomes reddened/irritated stop using the CHG and inform your nurse when you arrive at Short Stay. You may shave your face/neck. Please follow these instructions carefully:  1.  Shower with CHG Soap the night before surgery and the  morning of Surgery.  2.  If you choose to wash your hair, wash your hair first as usual with your  normal  shampoo.  3.  After you  shampoo, rinse your hair and body thoroughly to remove the  shampoo.                            4.  Use CHG as you would any other liquid soap.  You can apply chg directly  to the skin and wash                       Gently with a scrungie or clean washcloth.  5.  Apply the CHG Soap to your body ONLY FROM THE NECK DOWN.   Do not use on face/ open                           Wound or open sores. Avoid contact with eyes, ears mouth and genitals (private parts).                       Wash face,  Genitals (private parts) with your normal soap.             6.  Wash thoroughly, paying special attention to the area where your surgery  will be performed.  7.  Thoroughly rinse your body with warm water from the neck down.  8.  DO NOT shower/wash with your normal soap after using and rinsing off  the CHG Soap.             9.  Pat yourself dry with a clean towel.            10.  Wear clean pajamas.            11.  Place clean sheets on your bed the night of your first shower and do not  sleep with pets. Day of Surgery : Do not apply any lotions/deodorants the morning of surgery.  Please wear clean clothes to the hospital/surgery center.  FAILURE TO FOLLOW THESE INSTRUCTIONS MAY RESULT IN THE CANCELLATION OF YOUR SURGERY  PATIENT  SIGNATURE_________________________________   ________________________________________________________________________  Adam [place]ix  An incentive spirometer is a tool that can help keep your lungs clear and active. This tool measures how well you are filling your lungs with each breath. Taking long deep breaths may help reverse or decrease the chance of developing breathing (pulmonary) problems (especially infection) following: A long period of time when you are unable to move or be active. BEFORE THE PROCEDURE  If the spirometer includes an indicator to show your best effort, your nurse or respiratory therapist will set it to a desired goal. If possible, sit up straight or lean slightly forward. Try not to slouch. Hold the incentive spirometer in an upright position. INSTRUCTIONS FOR USE  Sit on the edge of your bed if possible, or sit up as far as you can in bed or on a chair. Hold the incentive spirometer in an upright position. Breathe out normally. Place the mouthpiece in your mouth and seal your lips tightly around it. Breathe in slowly and as deeply as possible, raising the piston or the ball toward the top of the column. Hold your breath for 3-5 seconds or for as long as possible. Allow the piston or ball to fall to the bottom of the column. Remove the mouthpiece from your mouth and breathe out normally. Rest for a few seconds and repeat Steps 1 through 7 at least 10 times every 1-2 hours when you are  awake. Take your time and take a few normal breaths between deep breaths. The spirometer may include an indicator to show your best effort. Use the indicator as a goal to work toward during each repetition. After each set of 10 deep breaths, practice coughing to be sure your lungs are clear. If you have an incision (the cut made at the time of surgery), support your incision when coughing by placing a pillow or rolled up towels firmly against it. Once you are able to get out of  bed, walk around indoors and cough well. You may stop using the incentive spirometer when instructed by your caregiver.  RISKS AND COMPLICATIONS Take your time so you do not get dizzy or light-headed. If you are in pain, you may need to take or ask for pain medication before doing incentive spirometry. It is harder to take a deep breath if you are having pain. AFTER USE Rest and breathe slowly and easily. It can be helpful to keep track of a log of your progress. Your caregiver can provide you with a simple table to help with this. If you are using the spirometer at home, follow these instructions: [place] IF:  You are having difficultly using the spirometer. You have trouble using the spirometer as often as instructed. Your pain medication is not giving enough relief while using the spirometer. You develop fever of 100.5 F (38.1 C) or higher. SEEK IMMEDIATE MEDICAL CARE IF:  You cough up bloody sputum that had not been present before. You develop fever of 102 F (38.9 C) or greater. You develop worsening pain at or near the incision site. MAKE SURE YOU:  Understand these instructions. Will watch your condition. Will get help right away if you are not doing well or get worse. Document Released: 01/26/2007 Document Revised: 12/08/2011 Document Reviewed: 03/29/2007 [facility] Patient Information 2014 [place], [place].   ________________________________________________________________________

## 2022-12-16 NOTE — Telephone Encounter (Signed)
Per 3/19 IB reached out to patient to reschedule, left voicemail

## 2022-12-16 NOTE — Telephone Encounter (Signed)
Patient called to reschedule appointment

## 2022-12-18 ENCOUNTER — Other Ambulatory Visit: Payer: Self-pay

## 2022-12-18 ENCOUNTER — Encounter (HOSPITAL_COMMUNITY): Payer: Self-pay

## 2022-12-18 ENCOUNTER — Encounter (HOSPITAL_COMMUNITY)
Admission: RE | Admit: 2022-12-18 | Discharge: 2022-12-18 | Disposition: A | Discharge status: Home / Self Care | Payer: BC Managed Care – PPO | Source: Ambulatory Visit | Attending: General Surgery | Admitting: General Surgery

## 2022-12-18 DIAGNOSIS — Z01818 Encounter for other preprocedural examination: Secondary | ICD-10-CM | POA: Diagnosis not present

## 2022-12-18 DIAGNOSIS — E0865 Diabetes mellitus due to underlying condition with hyperglycemia: Secondary | ICD-10-CM | POA: Diagnosis not present

## 2022-12-18 LAB — CBC
HCT: 46.2 % (ref 39.0–52.0)
Hemoglobin: 14.5 g/dL (ref 13.0–17.0)
MCH: 24.7 pg — ABNORMAL LOW (ref 26.0–34.0)
MCHC: 31.4 g/dL (ref 30.0–36.0)
MCV: 78.8 fL — ABNORMAL LOW (ref 80.0–100.0)
Platelets: 247 10*3/uL (ref 150–400)
RBC: 5.86 MIL/uL — ABNORMAL HIGH (ref 4.22–5.81)
RDW: 15.1 % (ref 11.5–15.5)
WBC: 7 10*3/uL (ref 4.0–10.5)
nRBC: 0 % (ref 0.0–0.2)

## 2022-12-18 LAB — BASIC METABOLIC PANEL
Anion gap: 11 (ref 5–15)
BUN: 27 mg/dL — ABNORMAL HIGH (ref 6–20)
CO2: 22 mmol/L (ref 22–32)
Calcium: 9.1 mg/dL (ref 8.9–10.3)
Chloride: 102 mmol/L (ref 98–111)
Creatinine, Ser: 1.04 mg/dL (ref 0.61–1.24)
GFR, Estimated: >60 mL/min (ref >60)
Glucose, Bld: 193 mg/dL — ABNORMAL HIGH (ref 70–99)
Potassium: 4 mmol/L (ref 3.5–5.1)
Sodium: 135 mmol/L (ref 135–145)

## 2022-12-18 LAB — GLUCOSE, CAPILLARY: Glucose-Capillary: 192 mg/dL — ABNORMAL HIGH (ref 70–99)

## 2022-12-18 LAB — TYPE AND SCREEN
ABO/RH(D): A POS
Antibody Screen: NEGATIVE

## 2022-12-18 NOTE — Consult Note (Addendum)
[facility] Nurse ostomy consult note Pt is in presurgical testing.  Requested to perform a pre-op stoma site marking. Pt is familiar to [facility] team and already has an ileostomy to the right abd. He states that the surgeon told him they will use the same location to revise his ostomy and he does not feel that a marking needs to be performed, since the pouch is currently located over the site. The patient is well informed regarding ostomy care and the procedure to be performed.  For these reasons, I did not perform a stoma site marking.  Please re-consult if further assistance is needed.  Thank-you,  Julien Girt MSN, [place], [place], [place], [place]

## 2022-12-18 NOTE — Progress Notes (Signed)
Anesthesia note:  Bowel prep reminder:  yes Pt will verify if he needs one  PCP - Dr. Rogers Blocker Cardiologist -none Other-   Chest x-ray - no EKG - 12/18/22-chart Stress Test - no ECHO - 2021 Cardiac Cath - no CABG-no Pacemaker/ICD device last checked:NA  Sleep Study - yes CPAP - yes   CBG at PAT visit-192. He takes Xigduo Fasting Blood Sugar at [employer] Blood Sugar _once a month____  Blood Thinner:no Blood Thinner Instructions: Aspirin Instructions: Last Dose:  Anesthesia review: No    Patient denies shortness of breath, fever, cough and chest pain at PAT appointment. Pt reports no SOB with activities   Patient verbalized understanding of instructions that were given to them at the PAT appointment. Patient was also instructed that they will need to review over the PAT instructions again at home before surgery.yes

## 2022-12-19 LAB — HEMOGLOBIN A1C
Hgb A1c MFr Bld: 9 % — ABNORMAL HIGH (ref 4.8–5.6)
Mean Plasma Glucose: 212 mg/dL

## 2022-12-30 NOTE — Anesthesia Preprocedure Evaluation (Addendum)
Anesthesia Evaluation  Patient identified by MRN, date of birth, ID band Patient awake    Reviewed: Allergy & Precautions, NPO status , Patient's Chart, lab work & pertinent test results  Airway Mallampati: III  TM Distance: >3 FB Neck ROM: Full    Dental  (+) Teeth Intact, Dental Advisory Given   Pulmonary asthma (uses inhaler very infrequently) , sleep apnea and Continuous Positive Airway Pressure Ventilation    Pulmonary exam normal breath sounds clear to auscultation       Cardiovascular + Peripheral Vascular Disease  Normal cardiovascular exam Rhythm:Regular Rate:Normal  BP 138/76 preop   Neuro/Psych  PSYCHIATRIC DISORDERS Anxiety Depression    negative neurological ROS     GI/Hepatic Neg liver ROS,GERD  Controlled,,CRC s/p LAR 06/2021 now w/ anastomotic leak   Endo/Other  diabetes, Poorly Controlled, Type 2, Oral Hypoglycemic Agents  A1c 9.0 FS 223 preop  Renal/GU negative Renal ROS  negative genitourinary   Musculoskeletal negative musculoskeletal ROS (+)    Abdominal   Peds  Hematology negative hematology ROS (+) Hb 14.5   Anesthesia Other Findings   Reproductive/Obstetrics negative OB ROS                              Anesthesia Physical Anesthesia Plan  ASA: 3  Anesthesia Plan: General   Post-op Pain Management: Ofirmev IV (intra-op)*, Ketamine IV*, Dilaudid IV and Lidocaine infusion*   Induction: Intravenous  PONV Risk Score and Plan: 3 and Ondansetron, Dexamethasone, Midazolam and Treatment may vary due to age or medical condition  Airway Management Planned: Oral ETT  Additional Equipment: None  Intra-op Plan:   Post-operative Plan: Extubation in OR  Informed Consent: I have reviewed the patients History and Physical, chart, labs and discussed the procedure including the risks, benefits and alternatives for the proposed anesthesia with the patient or authorized  representative who has indicated his/her understanding and acceptance.     Dental advisory given  Plan Discussed with: CRNA  Anesthesia Plan Comments: (Last airway for LAR:  Ventilation: Mask ventilation without difficulty and Oral airway inserted - appropriate to patient size Laryngoscope Size: Mac and 3 Grade View: Grade II Tube type: Oral Tube size: 7.5 mm Number of attempts: 1 )         Anesthesia Quick Evaluation

## 2022-12-31 ENCOUNTER — Other Ambulatory Visit: Payer: Self-pay

## 2022-12-31 ENCOUNTER — Inpatient Hospital Stay (HOSPITAL_COMMUNITY)
Admission: RE | Admit: 2022-12-31 | Discharge: 2023-01-03 | DRG: 909 | Disposition: A | Discharge status: Home / Self Care | Payer: BC Managed Care – PPO | Source: Ambulatory Visit | Attending: General Surgery | Admitting: General Surgery

## 2022-12-31 ENCOUNTER — Inpatient Hospital Stay (HOSPITAL_COMMUNITY): Payer: BC Managed Care – PPO | Admitting: Physician Assistant

## 2022-12-31 ENCOUNTER — Inpatient Hospital Stay (HOSPITAL_COMMUNITY): Payer: BC Managed Care – PPO | Admitting: Anesthesiology

## 2022-12-31 ENCOUNTER — Encounter (HOSPITAL_COMMUNITY): Payer: Self-pay | Admitting: General Surgery

## 2022-12-31 ENCOUNTER — Encounter (HOSPITAL_COMMUNITY)
Admission: RE | Disposition: A | Discharge status: Home / Self Care | Payer: Self-pay | Source: Ambulatory Visit | Attending: General Surgery

## 2022-12-31 DIAGNOSIS — E1151 Type 2 diabetes mellitus with diabetic peripheral angiopathy without gangrene: Secondary | ICD-10-CM | POA: Diagnosis present

## 2022-12-31 DIAGNOSIS — K6389 Other specified diseases of intestine: Secondary | ICD-10-CM

## 2022-12-31 DIAGNOSIS — Z7984 Long term (current) use of oral hypoglycemic drugs: Secondary | ICD-10-CM | POA: Diagnosis not present

## 2022-12-31 DIAGNOSIS — T81320A Disruption or dehiscence of gastrointestinal tract anastomosis, repair, or closure, initial encounter: Secondary | ICD-10-CM | POA: Diagnosis present

## 2022-12-31 DIAGNOSIS — T8132XA Disruption of internal operation (surgical) wound, not elsewhere classified, initial encounter: Secondary | ICD-10-CM | POA: Diagnosis not present

## 2022-12-31 DIAGNOSIS — Z8249 Family history of ischemic heart disease and other diseases of the circulatory system: Secondary | ICD-10-CM

## 2022-12-31 DIAGNOSIS — K2950 Unspecified chronic gastritis without bleeding: Secondary | ICD-10-CM | POA: Diagnosis not present

## 2022-12-31 DIAGNOSIS — E785 Hyperlipidemia, unspecified: Secondary | ICD-10-CM | POA: Diagnosis not present

## 2022-12-31 DIAGNOSIS — E1165 Type 2 diabetes mellitus with hyperglycemia: Secondary | ICD-10-CM | POA: Diagnosis not present

## 2022-12-31 DIAGNOSIS — Z833 Family history of diabetes mellitus: Secondary | ICD-10-CM | POA: Diagnosis not present

## 2022-12-31 DIAGNOSIS — Z432 Encounter for attention to ileostomy: Secondary | ICD-10-CM | POA: Diagnosis not present

## 2022-12-31 DIAGNOSIS — I10 Essential (primary) hypertension: Secondary | ICD-10-CM | POA: Diagnosis not present

## 2022-12-31 DIAGNOSIS — K9189 Other postprocedural complications and disorders of digestive system: Secondary | ICD-10-CM | POA: Diagnosis not present

## 2022-12-31 DIAGNOSIS — Z803 Family history of malignant neoplasm of breast: Secondary | ICD-10-CM | POA: Diagnosis not present

## 2022-12-31 DIAGNOSIS — Y832 Surgical operation with anastomosis, bypass or graft as the cause of abnormal reaction of the patient, or of later complication, without mention of misadventure at the time of the procedure: Secondary | ICD-10-CM | POA: Diagnosis not present

## 2022-12-31 DIAGNOSIS — G4733 Obstructive sleep apnea (adult) (pediatric): Secondary | ICD-10-CM | POA: Diagnosis present

## 2022-12-31 DIAGNOSIS — E0865 Diabetes mellitus due to underlying condition with hyperglycemia: Principal | ICD-10-CM

## 2022-12-31 DIAGNOSIS — Z87442 Personal history of urinary calculi: Secondary | ICD-10-CM | POA: Diagnosis not present

## 2022-12-31 HISTORY — DX: Disruption or dehiscence of gastrointestinal tract anastomosis, repair, or closure, initial encounter: T81.320A

## 2022-12-31 HISTORY — PX: XI ROBOTIC ASSISTED LOWER ANTERIOR RESECTION: SHX6558

## 2022-12-31 LAB — GLUCOSE, CAPILLARY
Glucose-Capillary: 223 mg/dL — ABNORMAL HIGH (ref 70–99)
Glucose-Capillary: 210 mg/dL — ABNORMAL HIGH (ref 70–99)
Glucose-Capillary: 220 mg/dL — ABNORMAL HIGH (ref 70–99)
Glucose-Capillary: 238 mg/dL — ABNORMAL HIGH (ref 70–99)
Glucose-Capillary: 207 mg/dL — ABNORMAL HIGH (ref 70–99)
Glucose-Capillary: 236 mg/dL — ABNORMAL HIGH (ref 70–99)

## 2022-12-31 SURGERY — RESECTION, RECTUM, LOW ANTERIOR, ROBOT-ASSISTED
Anesthesia: General

## 2022-12-31 MED ORDER — ONDANSETRON HCL 4 MG/2ML IJ SOLN: 4.0000 mg | Freq: Four times a day (QID) | INTRAMUSCULAR | Status: DC | PRN

## 2022-12-31 MED ORDER — BUPIVACAINE LIPOSOME 1.3 % IJ SUSP
INTRAMUSCULAR | Status: DC | PRN
  Administered 2022-12-31: 20 mL

## 2022-12-31 MED ORDER — IBUPROFEN 400 MG PO TABS
600.0000 mg | ORAL_TABLET | Freq: Four times a day (QID) | ORAL | Status: DC | PRN
  Administered 2023-01-02 – 2023-01-03 (×2): 600 mg via ORAL
  Filled 2022-12-31 (×2): qty 1

## 2022-12-31 MED ORDER — ENSURE SURGERY PO LIQD
237.0000 mL | Freq: Two times a day (BID) | ORAL | Status: DC
  Administered 2022-12-31 – 2023-01-03 (×6): 237 mL via ORAL

## 2022-12-31 MED ORDER — AMISULPRIDE (ANTIEMETIC) 5 MG/2ML IV SOLN: 10.0000 mg | Freq: Once | INTRAVENOUS | Status: DC | PRN

## 2022-12-31 MED ORDER — PROPOFOL 10 MG/ML IV BOLUS
INTRAVENOUS | Status: AC
  Filled 2022-12-31: qty 20

## 2022-12-31 MED ORDER — DEXAMETHASONE SODIUM PHOSPHATE 10 MG/ML IJ SOLN
INTRAMUSCULAR | Status: AC
  Filled 2022-12-31: qty 1

## 2022-12-31 MED ORDER — LIDOCAINE HCL (CARDIAC) PF 100 MG/5ML IV SOSY
PREFILLED_SYRINGE | INTRAVENOUS | Status: DC | PRN
  Administered 2022-12-31: 60 mg via INTRAVENOUS

## 2022-12-31 MED ORDER — DEXAMETHASONE SODIUM PHOSPHATE 10 MG/ML IJ SOLN
INTRAMUSCULAR | Status: DC | PRN
  Administered 2022-12-31: 8 mg via INTRAVENOUS

## 2022-12-31 MED ORDER — BUPIVACAINE HCL (PF) 0.5 % IJ SOLN
INTRAMUSCULAR | Status: DC | PRN
  Administered 2022-12-31: 30 mL

## 2022-12-31 MED ORDER — HYDROMORPHONE HCL 1 MG/ML IJ SOLN: 0.2500 mg | INTRAMUSCULAR | Status: DC | PRN

## 2022-12-31 MED ORDER — INDOCYANINE GREEN 25 MG IV SOLR
INTRAVENOUS | Status: DC | PRN
  Administered 2022-12-31: 7.5 mg via INTRAVENOUS

## 2022-12-31 MED ORDER — ONDANSETRON HCL 4 MG PO TABS: 4.0000 mg | ORAL_TABLET | Freq: Four times a day (QID) | ORAL | Status: DC | PRN

## 2022-12-31 MED ORDER — ROCURONIUM BROMIDE 100 MG/10ML IV SOLN
INTRAVENOUS | Status: DC | PRN
  Administered 2022-12-31: 100 mg via INTRAVENOUS
  Administered 2022-12-31: 20 mg via INTRAVENOUS

## 2022-12-31 MED ORDER — OXYCODONE HCL 5 MG PO TABS
ORAL_TABLET | ORAL | Status: AC
  Filled 2022-12-31: qty 1

## 2022-12-31 MED ORDER — CHLORHEXIDINE GLUCONATE 0.12 % MT SOLN
15.0000 mL | Freq: Once | OROMUCOSAL | Status: AC
  Administered 2022-12-31: 15 mL via OROMUCOSAL

## 2022-12-31 MED ORDER — BISACODYL 5 MG PO TBEC: 20.0000 mg | DELAYED_RELEASE_TABLET | Freq: Once | ORAL | Status: DC

## 2022-12-31 MED ORDER — FENTANYL CITRATE (PF) 250 MCG/5ML IJ SOLN
INTRAMUSCULAR | Status: AC
  Filled 2022-12-31: qty 5

## 2022-12-31 MED ORDER — ALUM & MAG HYDROXIDE-SIMETH 200-200-20 MG/5ML PO SUSP: 30.0000 mL | Freq: Four times a day (QID) | ORAL | Status: DC | PRN

## 2022-12-31 MED ORDER — ONDANSETRON HCL 4 MG/2ML IJ SOLN: 4.0000 mg | Freq: Once | INTRAMUSCULAR | Status: DC | PRN

## 2022-12-31 MED ORDER — BUPIVACAINE LIPOSOME 1.3 % IJ SUSP: 20.0000 mL | Freq: Once | INTRAMUSCULAR | Status: DC

## 2022-12-31 MED ORDER — ALBUTEROL SULFATE (2.5 MG/3ML) 0.083% IN NEBU: 3.0000 mL | INHALATION_SOLUTION | RESPIRATORY_TRACT | Status: DC | PRN

## 2022-12-31 MED ORDER — SODIUM CHLORIDE 0.9 % IV SOLN
2.0000 g | Freq: Two times a day (BID) | INTRAVENOUS | Status: AC
  Administered 2022-12-31: 2 g via INTRAVENOUS
  Filled 2022-12-31: qty 2

## 2022-12-31 MED ORDER — DAPAGLIFLOZIN PRO-METFORMIN ER 5-1000 MG PO TB24: 1.0000 | ORAL_TABLET | Freq: Two times a day (BID) | ORAL | Status: DC

## 2022-12-31 MED ORDER — HYDROMORPHONE HCL 2 MG/ML IJ SOLN
INTRAMUSCULAR | Status: AC
  Filled 2022-12-31: qty 1

## 2022-12-31 MED ORDER — KETAMINE HCL 50 MG/5ML IJ SOSY
PREFILLED_SYRINGE | INTRAMUSCULAR | Status: AC
  Filled 2022-12-31: qty 5

## 2022-12-31 MED ORDER — METFORMIN HCL ER 500 MG PO TB24
1000.0000 mg | ORAL_TABLET | Freq: Every day | ORAL | Status: DC
  Administered 2023-01-01 – 2023-01-03 (×3): 1000 mg via ORAL
  Filled 2022-12-31 (×3): qty 2

## 2022-12-31 MED ORDER — BUPIVACAINE HCL (PF) 0.5 % IJ SOLN
INTRAMUSCULAR | Status: AC
  Filled 2022-12-31: qty 30

## 2022-12-31 MED ORDER — OXYCODONE HCL 5 MG PO TABS
5.0000 mg | ORAL_TABLET | Freq: Once | ORAL | Status: AC | PRN
  Administered 2022-12-31: 5 mg via ORAL

## 2022-12-31 MED ORDER — ENOXAPARIN SODIUM 40 MG/0.4ML IJ SOSY
40.0000 mg | PREFILLED_SYRINGE | INTRAMUSCULAR | Status: DC
  Administered 2023-01-01 – 2023-01-03 (×3): 40 mg via SUBCUTANEOUS
  Filled 2022-12-31 (×3): qty 0.4

## 2022-12-31 MED ORDER — ALVIMOPAN 12 MG PO CAPS
12.0000 mg | ORAL_CAPSULE | Freq: Two times a day (BID) | ORAL | Status: DC
  Administered 2023-01-01 – 2023-01-02 (×4): 12 mg via ORAL
  Filled 2022-12-31 (×4): qty 1

## 2022-12-31 MED ORDER — KETAMINE HCL 10 MG/ML IJ SOLN
INTRAMUSCULAR | Status: DC | PRN
  Administered 2022-12-31: 30 mg via INTRAVENOUS
  Administered 2022-12-31: 20 mg via INTRAVENOUS

## 2022-12-31 MED ORDER — GABAPENTIN 300 MG PO CAPS
300.0000 mg | ORAL_CAPSULE | ORAL | Status: AC
  Administered 2022-12-31: 300 mg via ORAL
  Filled 2022-12-31: qty 1

## 2022-12-31 MED ORDER — KCL IN DEXTROSE-NACL 20-5-0.45 MEQ/L-%-% IV SOLN
INTRAVENOUS | Status: DC
  Filled 2022-12-31: qty 1000

## 2022-12-31 MED ORDER — HYDROMORPHONE HCL 1 MG/ML IJ SOLN
0.5000 mg | INTRAMUSCULAR | Status: DC | PRN
  Administered 2022-12-31 – 2023-01-01 (×4): 0.5 mg via INTRAVENOUS
  Filled 2022-12-31 (×4): qty 0.5

## 2022-12-31 MED ORDER — ENSURE PRE-SURGERY PO LIQD: 296.0000 mL | Freq: Once | ORAL | Status: DC

## 2022-12-31 MED ORDER — ACETAMINOPHEN 500 MG PO TABS
1000.0000 mg | ORAL_TABLET | Freq: Four times a day (QID) | ORAL | Status: DC | PRN
  Administered 2023-01-01 – 2023-01-02 (×3): 1000 mg via ORAL
  Filled 2022-12-31 (×3): qty 2

## 2022-12-31 MED ORDER — LACTATED RINGERS IV SOLN: INTRAVENOUS | Status: DC

## 2022-12-31 MED ORDER — SODIUM CHLORIDE 0.9 % IV SOLN
2.0000 g | INTRAVENOUS | Status: AC
  Administered 2022-12-31: 2 g via INTRAVENOUS
  Filled 2022-12-31: qty 2

## 2022-12-31 MED ORDER — ALPRAZOLAM 0.5 MG PO TABS: 0.5000 mg | ORAL_TABLET | Freq: Every day | ORAL | Status: DC | PRN

## 2022-12-31 MED ORDER — INSULIN ASPART 100 UNIT/ML IJ SOLN
3.0000 [IU] | Freq: Once | INTRAMUSCULAR | Status: AC
  Administered 2022-12-31: 3 [IU] via SUBCUTANEOUS
  Filled 2022-12-31: qty 1

## 2022-12-31 MED ORDER — GABAPENTIN 300 MG PO CAPS
300.0000 mg | ORAL_CAPSULE | Freq: Every day | ORAL | Status: DC
  Administered 2022-12-31 – 2023-01-02 (×3): 300 mg via ORAL
  Filled 2022-12-31 (×2): qty 1
  Filled 2022-12-31: qty 3

## 2022-12-31 MED ORDER — INSULIN ASPART 100 UNIT/ML IJ SOLN
INTRAMUSCULAR | Status: DC | PRN
  Administered 2022-12-31: 3 [IU] via SUBCUTANEOUS

## 2022-12-31 MED ORDER — DIPHENHYDRAMINE HCL 50 MG/ML IJ SOLN: 12.5000 mg | Freq: Four times a day (QID) | INTRAMUSCULAR | Status: DC | PRN

## 2022-12-31 MED ORDER — INSULIN ASPART 100 UNIT/ML IJ SOLN
5.0000 [IU] | Freq: Once | INTRAMUSCULAR | Status: AC
  Administered 2022-12-31: 5 [IU] via SUBCUTANEOUS

## 2022-12-31 MED ORDER — ORAL CARE MOUTH RINSE: 15.0000 mL | Freq: Once | OROMUCOSAL | Status: AC

## 2022-12-31 MED ORDER — LIDOCAINE HCL (PF) 2 % IJ SOLN
INTRAMUSCULAR | Status: AC
  Filled 2022-12-31: qty 15

## 2022-12-31 MED ORDER — ONDANSETRON HCL 4 MG/2ML IJ SOLN
INTRAMUSCULAR | Status: DC | PRN
  Administered 2022-12-31: 4 mg via INTRAVENOUS

## 2022-12-31 MED ORDER — INSULIN ASPART 100 UNIT/ML IJ SOLN
0.0000 [IU] | Freq: Every day | INTRAMUSCULAR | Status: DC
  Administered 2022-12-31 – 2023-01-02 (×2): 2 [IU] via SUBCUTANEOUS

## 2022-12-31 MED ORDER — INSULIN ASPART 100 UNIT/ML IJ SOLN
0.0000 [IU] | Freq: Three times a day (TID) | INTRAMUSCULAR | Status: DC
  Administered 2022-12-31: 7 [IU] via SUBCUTANEOUS
  Administered 2023-01-01: 4 [IU] via SUBCUTANEOUS
  Administered 2023-01-01 – 2023-01-02 (×3): 7 [IU] via SUBCUTANEOUS
  Administered 2023-01-02: 3 [IU] via SUBCUTANEOUS
  Administered 2023-01-02: 7 [IU] via SUBCUTANEOUS
  Administered 2023-01-03: 4 [IU] via SUBCUTANEOUS

## 2022-12-31 MED ORDER — POLYETHYLENE GLYCOL 3350 17 GM/SCOOP PO POWD: 1.0000 | Freq: Once | ORAL | Status: DC

## 2022-12-31 MED ORDER — MIDAZOLAM HCL 2 MG/2ML IJ SOLN
INTRAMUSCULAR | Status: AC
  Filled 2022-12-31: qty 2

## 2022-12-31 MED ORDER — FENTANYL CITRATE (PF) 100 MCG/2ML IJ SOLN
INTRAMUSCULAR | Status: DC | PRN
  Administered 2022-12-31 (×2): 100 ug via INTRAVENOUS
  Administered 2022-12-31: 50 ug via INTRAVENOUS

## 2022-12-31 MED ORDER — DIPHENHYDRAMINE HCL 12.5 MG/5ML PO ELIX: 12.5000 mg | ORAL_SOLUTION | Freq: Four times a day (QID) | ORAL | Status: DC | PRN

## 2022-12-31 MED ORDER — HYDROMORPHONE HCL 1 MG/ML IJ SOLN
INTRAMUSCULAR | Status: DC | PRN
  Administered 2022-12-31 (×2): 1 mg via INTRAVENOUS

## 2022-12-31 MED ORDER — CALCIUM CARBONATE ANTACID 500 MG PO CHEW: 2.0000 | CHEWABLE_TABLET | Freq: Every day | ORAL | Status: DC | PRN

## 2022-12-31 MED ORDER — OXYCODONE HCL 5 MG/5ML PO SOLN: 5.0000 mg | Freq: Once | ORAL | Status: AC | PRN

## 2022-12-31 MED ORDER — TRAMADOL HCL 50 MG PO TABS
50.0000 mg | ORAL_TABLET | Freq: Four times a day (QID) | ORAL | Status: DC | PRN
  Administered 2022-12-31 – 2023-01-02 (×4): 50 mg via ORAL
  Filled 2022-12-31 (×4): qty 1

## 2022-12-31 MED ORDER — SUGAMMADEX SODIUM 500 MG/5ML IV SOLN
INTRAVENOUS | Status: DC | PRN
  Administered 2022-12-31: 400 mg via INTRAVENOUS

## 2022-12-31 MED ORDER — 0.9 % SODIUM CHLORIDE (POUR BTL) OPTIME
TOPICAL | Status: DC | PRN
  Administered 2022-12-31: 2000 mL

## 2022-12-31 MED ORDER — MIDAZOLAM HCL 5 MG/5ML IJ SOLN
INTRAMUSCULAR | Status: DC | PRN
  Administered 2022-12-31: 2 mg via INTRAVENOUS

## 2022-12-31 MED ORDER — PROPOFOL 10 MG/ML IV BOLUS
INTRAVENOUS | Status: DC | PRN
  Administered 2022-12-31: 150 mg via INTRAVENOUS

## 2022-12-31 MED ORDER — DAPAGLIFLOZIN PROPANEDIOL 5 MG PO TABS
5.0000 mg | ORAL_TABLET | Freq: Every day | ORAL | Status: DC
  Administered 2023-01-01 – 2023-01-03 (×3): 5 mg via ORAL
  Filled 2022-12-31 (×3): qty 1

## 2022-12-31 MED ORDER — ALVIMOPAN 12 MG PO CAPS
12.0000 mg | ORAL_CAPSULE | ORAL | Status: AC
  Administered 2022-12-31: 12 mg via ORAL
  Filled 2022-12-31: qty 1

## 2022-12-31 MED ORDER — LIDOCAINE HCL (PF) 2 % IJ SOLN
INTRAMUSCULAR | Status: DC | PRN
  Administered 2022-12-31: 1.5 mg/kg/h via INTRADERMAL

## 2022-12-31 MED ORDER — SACCHAROMYCES BOULARDII 250 MG PO CAPS
250.0000 mg | ORAL_CAPSULE | Freq: Two times a day (BID) | ORAL | Status: DC
  Administered 2022-12-31 – 2023-01-03 (×7): 250 mg via ORAL
  Filled 2022-12-31 (×7): qty 1

## 2022-12-31 MED ORDER — ENSURE PRE-SURGERY PO LIQD: 592.0000 mL | Freq: Once | ORAL | Status: DC

## 2022-12-31 MED ORDER — ACETAMINOPHEN 500 MG PO TABS
1000.0000 mg | ORAL_TABLET | ORAL | Status: AC
  Administered 2022-12-31: 1000 mg via ORAL
  Filled 2022-12-31: qty 2

## 2022-12-31 MED ORDER — SIMETHICONE 80 MG PO CHEW
40.0000 mg | CHEWABLE_TABLET | Freq: Four times a day (QID) | ORAL | Status: DC | PRN
  Administered 2023-01-01: 40 mg via ORAL
  Filled 2022-12-31: qty 1

## 2022-12-31 MED ORDER — ONDANSETRON HCL 4 MG/2ML IJ SOLN
INTRAMUSCULAR | Status: AC
  Filled 2022-12-31: qty 2

## 2022-12-31 MED ORDER — RINGERS IRRIGATION IR SOLN
Status: DC | PRN
  Administered 2022-12-31 (×2): 1000 mL

## 2022-12-31 MED ORDER — HEPARIN SODIUM (PORCINE) 5000 UNIT/ML IJ SOLN
5000.0000 [IU] | Freq: Once | INTRAMUSCULAR | Status: AC
  Administered 2022-12-31: 5000 [IU] via SUBCUTANEOUS
  Filled 2022-12-31: qty 1

## 2022-12-31 MED ORDER — BUPIVACAINE LIPOSOME 1.3 % IJ SUSP
INTRAMUSCULAR | Status: AC
  Filled 2022-12-31: qty 20

## 2022-12-31 MED ORDER — ROCURONIUM BROMIDE 10 MG/ML (PF) SYRINGE
PREFILLED_SYRINGE | INTRAVENOUS | Status: AC
  Filled 2022-12-31: qty 10

## 2022-12-31 MED ORDER — FENOFIBRATE 160 MG PO TABS
160.0000 mg | ORAL_TABLET | Freq: Every day | ORAL | Status: DC
  Administered 2022-12-31 – 2023-01-03 (×4): 160 mg via ORAL
  Filled 2022-12-31 (×4): qty 1

## 2022-12-31 MED ORDER — INSULIN ASPART 100 UNIT/ML IJ SOLN
INTRAMUSCULAR | Status: AC
  Filled 2022-12-31: qty 1

## 2022-12-31 SURGICAL SUPPLY — 101 items
BAG COUNTER SPONGE SURGICOUNT (BAG) ×1 IMPLANT
BAG SPNG CNTER NS LX DISP (BAG)
BLADE EXTENDED COATED 6.5IN (ELECTRODE) IMPLANT
CANNULA REDUC XI 12-8 STAPL (CANNULA)
CANNULA REDUCER 12-8 DVNC XI (CANNULA) IMPLANT
CELLS DAT CNTRL 66122 CELL SVR (MISCELLANEOUS) IMPLANT
COVER SURGICAL LIGHT HANDLE (MISCELLANEOUS) ×2 IMPLANT
COVER TIP SHEARS 8 DVNC (MISCELLANEOUS) ×1 IMPLANT
COVER TIP SHEARS 8MM DA VINCI (MISCELLANEOUS) ×1
DRAIN CHANNEL 19F RND (DRAIN) IMPLANT
DRAPE ARM DVNC X/XI (DISPOSABLE) ×4 IMPLANT
DRAPE COLUMN DVNC XI (DISPOSABLE) ×1 IMPLANT
DRAPE DA VINCI XI ARM (DISPOSABLE) ×4
DRAPE DA VINCI XI COLUMN (DISPOSABLE) ×1
DRAPE INCISE IOBAN 66X45 STRL (DRAPES) IMPLANT
DRAPE SURG IRRIG POUCH 19X23 (DRAPES) ×1 IMPLANT
DRSG OPSITE POSTOP 4X10 (GAUZE/BANDAGES/DRESSINGS) IMPLANT
DRSG OPSITE POSTOP 4X6 (GAUZE/BANDAGES/DRESSINGS) IMPLANT
DRSG OPSITE POSTOP 4X8 (GAUZE/BANDAGES/DRESSINGS) IMPLANT
DRSG TEGADERM 4X4.75 (GAUZE/BANDAGES/DRESSINGS) IMPLANT
ELECT PENCIL ROCKER SW 15FT (MISCELLANEOUS) ×1 IMPLANT
ELECT REM PT RETURN 15FT ADLT (MISCELLANEOUS) ×1 IMPLANT
ENDOLOOP SUT PDS II  0 18 (SUTURE)
ENDOLOOP SUT PDS II 0 18 (SUTURE) IMPLANT
EVACUATOR SILICONE 100CC (DRAIN) IMPLANT
GAUZE SPONGE 2X2 STRL 8-PLY (GAUZE/BANDAGES/DRESSINGS) IMPLANT
GLOVE BIO SURGEON STRL SZ 6.5 (GLOVE) ×3 IMPLANT
GLOVE BIOGEL PI IND STRL 7.0 (GLOVE) ×2 IMPLANT
GLOVE INDICATOR 6.5 STRL GRN (GLOVE) ×1 IMPLANT
GOWN SRG XL LVL 4 BRTHBL STRL (GOWNS) ×1 IMPLANT
GOWN STRL NON-REIN XL LVL4 (GOWNS) ×1
GOWN STRL REUS W/ TWL XL LVL3 (GOWN DISPOSABLE) ×3 IMPLANT
GOWN STRL REUS W/TWL XL LVL3 (GOWN DISPOSABLE) ×3
GRASPER SUT TROCAR 14GX15 (MISCELLANEOUS) IMPLANT
HOLDER FOLEY CATH W/STRAP (MISCELLANEOUS) ×1 IMPLANT
IRRIG SUCT STRYKERFLOW 2 WTIP (MISCELLANEOUS) ×1
IRRIGATION SUCT STRKRFLW 2 WTP (MISCELLANEOUS) ×1 IMPLANT
KIT PROCEDURE DA VINCI SI (MISCELLANEOUS)
KIT PROCEDURE DVNC SI (MISCELLANEOUS) IMPLANT
KIT TURNOVER KIT A (KITS) IMPLANT
NDL INSUFFLATION 14GA 120MM (NEEDLE) ×1 IMPLANT
NEEDLE INSUFFLATION 14GA 120MM (NEEDLE) ×1 IMPLANT
PACK CARDIOVASCULAR III (CUSTOM PROCEDURE TRAY) ×1 IMPLANT
PACK COLON (CUSTOM PROCEDURE TRAY) ×1 IMPLANT
PAD POSITIONING PINK XL (MISCELLANEOUS) ×1 IMPLANT
RELOAD PROXIMATE 75MM BLUE (ENDOMECHANICALS) ×3 IMPLANT
RELOAD STAPLE 60 3.5 BLU DVNC (STAPLE) IMPLANT
RELOAD STAPLE 60 4.3 GRN DVNC (STAPLE) IMPLANT
RELOAD STAPLE 75 3.8 BLU REG (ENDOMECHANICALS) IMPLANT
RELOAD STAPLER 3.5X60 BLU DVNC (STAPLE) IMPLANT
RELOAD STAPLER 4.3X60 GRN DVNC (STAPLE) IMPLANT
RETRACTOR WND ALEXIS 18 MED (MISCELLANEOUS) IMPLANT
RTRCTR WOUND ALEXIS 18CM MED (MISCELLANEOUS)
SCISSORS LAP 5X35 DISP (ENDOMECHANICALS) IMPLANT
SEAL UNIV 5-12 XI (MISCELLANEOUS) ×3 IMPLANT
SEAL XI UNIVERSAL 5-12 (MISCELLANEOUS) ×5
SEALER VESSEL DA VINCI XI (MISCELLANEOUS) ×1
SEALER VESSEL EXT DVNC XI (MISCELLANEOUS) ×1 IMPLANT
SOL ELECTROSURG ANTI STICK (MISCELLANEOUS) ×1
SOLUTION ELECTROSURG ANTI STCK (MISCELLANEOUS) ×1 IMPLANT
SPIKE FLUID TRANSFER (MISCELLANEOUS) IMPLANT
STAPLER 60 DA VINCI SURE FORM (STAPLE)
STAPLER 60 SUREFORM DVNC (STAPLE) IMPLANT
STAPLER 90 3.5 STAND SLIM (STAPLE) ×1
STAPLER 90 3.5 STD SLIM (STAPLE) IMPLANT
STAPLER ECHELON POWER CIR 29 (STAPLE) IMPLANT
STAPLER ECHELON POWER CIR 31 (STAPLE) IMPLANT
STAPLER PROXIMATE 75MM BLUE (STAPLE) IMPLANT
STAPLER RELOAD 3.5X60 BLU DVNC (STAPLE)
STAPLER RELOAD 3.5X60 BLUE (STAPLE)
STAPLER RELOAD 4.3X60 GREEN (STAPLE)
STAPLER RELOAD 4.3X60 GRN DVNC (STAPLE)
STOPCOCK 4 WAY LG BORE MALE ST (IV SETS) ×2 IMPLANT
SUT ETHILON 2 0 PS N (SUTURE) IMPLANT
SUT NOVA NAB GS-21 1 T12 (SUTURE) ×2 IMPLANT
SUT PROLENE 2 0 KS (SUTURE) IMPLANT
SUT SILK 2 0 (SUTURE) ×1
SUT SILK 2 0 SH CR/8 (SUTURE) IMPLANT
SUT SILK 2-0 18XBRD TIE 12 (SUTURE) ×1 IMPLANT
SUT SILK 3 0 (SUTURE)
SUT SILK 3 0 SH CR/8 (SUTURE) ×1 IMPLANT
SUT SILK 3-0 18XBRD TIE 12 (SUTURE) IMPLANT
SUT V-LOC BARB 180 2/0GR6 GS22 (SUTURE) ×1
SUT VIC AB 2-0 SH 18 (SUTURE) IMPLANT
SUT VIC AB 2-0 SH 27 (SUTURE)
SUT VIC AB 2-0 SH 27X BRD (SUTURE) IMPLANT
SUT VIC AB 3-0 SH 18 (SUTURE) IMPLANT
SUT VIC AB 4-0 PS2 27 (SUTURE) ×2 IMPLANT
SUT VICRYL 0 UR6 27IN ABS (SUTURE) ×1 IMPLANT
SUTURE V-LC BRB 180 2/0GR6GS22 (SUTURE) IMPLANT
SYR 20ML ECCENTRIC (SYRINGE) ×1 IMPLANT
SYS LAPSCP GELPORT 120MM (MISCELLANEOUS)
SYS WOUND ALEXIS 18CM MED (MISCELLANEOUS) ×1
SYSTEM LAPSCP GELPORT 120MM (MISCELLANEOUS) IMPLANT
SYSTEM WOUND ALEXIS 18CM MED (MISCELLANEOUS) IMPLANT
TOWEL OR 17X26 10 PK STRL BLUE (TOWEL DISPOSABLE) IMPLANT
TOWEL OR NON WOVEN STRL DISP B (DISPOSABLE) ×1 IMPLANT
TRAY FOLEY MTR SLVR 16FR STAT (SET/KITS/TRAYS/PACK) ×1 IMPLANT
TROCAR ADV FIXATION 5X100MM (TROCAR) ×1 IMPLANT
TUBING CONNECTING 10 (TUBING) ×2 IMPLANT
TUBING INSUFFLATION 10FT LAP (TUBING) ×1 IMPLANT

## 2022-12-31 NOTE — Transfer of Care (Signed)
Immediate Anesthesia Transfer of Care Note  Patient: Steven [name]  Procedure(s) Performed: ROBOTIC ASSISTED LOWER ANTERIOR RESECTION WITH OSTOMY with intraop ICG guided perfusion, takedown of iliostomy  Patient Location: PACU  Anesthesia Type:General  Level of Consciousness: awake and drowsy  Airway & Oxygen Therapy: Patient Spontanous Breathing and Patient connected to face mask oxygen  Post-op Assessment: Report given to RN, Post -op Vital signs reviewed and stable, and Patient moving all extremities X 4  Post vital signs: Reviewed and stable  Last Vitals:  Vitals Value Taken Time  BP 118/71 12/31/22 1230  Temp    Pulse 90 12/31/22 1232  Resp 11 12/31/22 1232  SpO2 93 % 12/31/22 1232  Vitals shown include unvalidated device data.  Last Pain:  Vitals:   12/31/22 0652  TempSrc: Oral  PainSc: 0-No pain         Complications: No notable events documented.

## 2022-12-31 NOTE — Op Note (Addendum)
12/31/2022  11:56 AM  PATIENT:  Steven [name]  52 y.o. male  Patient Care Team: Shirline Frees, MD as PCP - General (Family Medicine) [name], MD as Consulting Physician (Hematology and Oncology) [name], MD as Consulting Physician (General Surgery) Causey, Charlestine Massed, NP as Nurse Practitioner (Hematology and Oncology) Kyung Rudd, MD as Consulting Physician (Radiation Oncology)  PRE-OPERATIVE DIAGNOSIS:  ANASTOMOTIC DISRUPTION  POST-OPERATIVE DIAGNOSIS:  ANASTOMOTIC DISRUPTION  PROCEDURE:   ROBOTIC ASSISTED LOWER ANTERIOR RESECTION WITH COLOSTOMY   intraop ICG assessment of perfusion, takedown of iliostomy    Surgeon(s): [name], MD [name], MD  ASSISTANT: Dr Johney [place]   ANESTHESIA:   local and general  EBL: 135ml  Total I/O In: 1700 [I.V.:1600; IV Piggyback:100] Out: 380 [Urine:280; Blood:100]  Delay start of Pharmacological VTE agent (>24hrs) due to surgical blood loss or risk of bleeding:  no  DRAINS: (4F) Jackson-Pratt drain(s) with closed bulb suction in the pelvis    SPECIMEN:  Distal colon, ileostomy  DISPOSITION OF SPECIMEN:  PATHOLOGY  COUNTS:  YES  PLAN OF CARE: Admit to inpatient   PATIENT DISPOSITION:  PACU - hemodynamically stable.  INDICATION:    52 y.o. M with failure to heal an anorectal anastomosis.  After a long discussion in the office about redo anastomosis versus end colostomy, the patient elected to proceed with removal of the colon and end colostomy.  He is clear that this will be a permanent solution.   The anatomy & physiology of the digestive tract was discussed.  The pathophysiology was discussed.  Natural history risks without surgery was discussed.   I worked to give an overview of the disease and the frequent need to have multispecialty involvement.  I feel the risks of no intervention will lead to serious problems that outweigh the operative risks; therefore, I recommended a partial colectomy to remove  the pathology.  Laparoscopic & open techniques were discussed.   Risks such as bleeding, infection, abscess, leak, reoperation, possible ostomy, hernia, heart attack, death, and other risks were discussed.  I noted a good likelihood this will help address the problem.   Goals of post-operative recovery were discussed as well.    The patient expressed understanding & wished to proceed with surgery.  OR FINDINGS:   Patient had no signs of ischemia or tension at the anastomosis  No obvious metastatic disease on visceral parietal peritoneum or liver.   DESCRIPTION:   Informed consent was confirmed.  The patient underwent general anaesthesia without difficulty.  The patient was positioned appropriately.  VTE prevention in place.  The patient's abdomen was clipped, prepped, & draped in a sterile fashion.  Surgical timeout confirmed our plan.  The patient was positioned in reverse Trendelenburg.  Abdominal entry was gained using a Varies needle.  Entry was clean.  I induced carbon dioxide insufflation.  An 26mm robotic port was placed in the RUQ.  Camera inspection revealed no injury.  Extra ports were carefully placed under direct laparoscopic visualization.  I laparoscopically reflected the greater omentum and the upper abdomen the small bowel in the upper abdomen. The patient was appropriately positioned and the robot was docked to the patient's left side.  Instruments were placed under direct visualization.    I mobilized the colon off of the pelvic sidewall.  I began to circumferentially dissect the adhesions from the pelvic wall by starting on the right lateral side and working my way underneath the colon.  I then came across the anterior pelvic inlet.  I identified the right and left ureters and My dissection above these.  I continued my dissection down using the robotic vessel sealer and to the level of the pelvic floor.  I then used my robotic vessel sealer to dissect the anastomosis out from the  rectum.  This was then divided using the vessel sealer.  I continued this anteriorly and posteriorly.  The rectum appeared intact and viable.  There appeared to be a lot of inflammation posteriorly which was pulling the anastomosis down and possibly accounted for the anterior disruption.  There was good vascular perfusion noted in both structures.  Once the entire anastomosis was separated this was clamped and left in the pelvis.  I mobilized the rectal edges using blunt dissection and then closed the anal canal using a running 2-0 V lock suture.  The pelvis was inspected for hemostasis.  There was no active bleeding.  The pelvis was irrigated with warm normal saline.  I attempted to close the pelvis.  The peritoneum was frozen and unable to mobilize.  I attempted to create an omental pedicle by taking down the left side of the omentum.  Unfortunately this resulted in ischemia of the distal omentum and therefore this had to be resected and removed.  I decided to place a 70 [place] Blake drain in the pelvis instead.  The robot was then undocked.  I turned my attention to the ileostomy.  This was taken down by making an incision around the mucocutaneous junction using electrocautery.  I carried this down through the subcutaneous tissues until the entire ileostomy was exposed and mobile.  I then transected both limbs of the ileostomy and sent this for pathologic examination.  The mesenteric defect was closed using interrupted 2-0 silk sutures.  The anastomosis was created by making a small enterotomy in the proximal and distal limbs and placing a 75 mm GIA stapler.  The common enterotomy was then closed using a 90 mm TA stapler.  Hemostasis was good.  And tied tension suture was placed using 3-0 silk suture.  This was then placed back into the abdomen and the abdomen was irrigated with 2 more liters of warm normal saline. The remaining colon was then brought out through the ileostomy site and stapled using a GIA  stapler.  This was then secured.  The abdomen was insufflated and evaluated for hemostasis.  There is no active bleeding noted.  Irrigant was removed.  The Blake drain was secured using a 2-0 nylon suture.  The instruments were then removed.  The abdomen was desufflated.  The port sites were closed using interrupted 4-0 Vicryl sutures and Dermabond.  The colostomy was then matured in standard Brooke fashion using interrupted 2-0 Vicryl sutures.  A colostomy appliance was placed.  The patient was then awakened from anesthesia and sent to the postanesthesia care unit in stable condition.  All counts were correct per operating room staff.  An MD assistant was necessary for tissue manipulation, retraction and positioning due to the complexity of the case and hospital policies   Rosario Adie, MD  Colorectal and [place] [facility]

## 2022-12-31 NOTE — H&P (Signed)
HPI: Steven [name] is an 52 y.o. male who is here for ostomy creation.  He has a chronic anterior leak that has not healed in over 1 year.  Pt has elected to proceed with takedown and colostomy formation  Past Medical History:  Diagnosis Date   Anemia 02/2021   iron   Anxiety    Asthma    Cancer 02/2021   Depression    Diabetes mellitus without complication    History of kidney stones    HLD (hyperlipidemia)    Hypertension    Kidney stones    Neuromuscular disorder    from radiation bi lat   OSA (obstructive sleep apnea)    CPAP   Peripheral vascular disease    DVT   Sleep apnea     Past Surgical History:  Procedure Laterality Date   APPENDECTOMY  1989   ag   COLONOSCOPY  03/05/2021   CYST EXCISION  2011   left neck   DIVERTING ILEOSTOMY N/A 07/17/2021   Procedure: DIVERTING ILEOSTOMY;  Surgeon: [name], MD;  Location: [facility];  Service: General;  Laterality: N/A;   FLEXIBLE SIGMOIDOSCOPY N/A 04/04/2022   Procedure: FLEXIBLE SIGMOIDOSCOPY WITH POSSIBLE DEBRIDEMENT;  Surgeon: [name], MD;  Location: [place];  Service: General;  Laterality: N/A;   RECTAL EXAM UNDER ANESTHESIA N/A 04/04/2022   Procedure: ANAL EXAM UNDER ANESTHESIA;  Surgeon: [name], MD;  Location: [place];  Service: General;  Laterality: N/A;   WISDOM TOOTH EXTRACTION     age 48   XI ROBOTIC ASSISTED LOWER ANTERIOR RESECTION N/A 07/17/2021   Procedure: XI ROBOTIC ASSISTED LOWER ANTERIOR RESECTION;  Surgeon: [name], MD;  Location: [facility];  Service: General;  Laterality: N/A;    Family History  Problem Relation Age of Onset   Diabetes Mellitus II Mother    Breast cancer Mother 2   Thrombophlebitis Father    Breast cancer Maternal Grandmother 47   Diabetes Maternal Grandmother    Breast cancer Maternal Aunt 60   Diabetes Maternal Aunt    Hypertension Paternal Grandmother    Colon cancer Neg Hx    Esophageal cancer Neg Hx     Inflammatory bowel disease Neg Hx    Liver disease Neg Hx    Pancreatic cancer Neg Hx    Rectal cancer Neg Hx    Stomach cancer Neg Hx     Social:  reports that he has never smoked. He has never used smokeless tobacco. He reports current alcohol use. He reports that he does not use drugs.  Allergies: No Active Allergies  Medications: I have reviewed the patient's current medications.  Results for orders placed or performed during the hospital encounter of 12/31/22 (from the past 48 hour(s))  Glucose, capillary     Status: Abnormal   Collection Time: 12/31/22  7:00 AM  Result Value Ref Range   Glucose-Capillary 223 (H) 70 - 99 mg/dL    Comment: Glucose reference range applies only to samples taken after fasting for at least 8 hours.    No results found.  ROS - all of the below systems have been reviewed with the patient and positives are indicated with bold text General: chills, fever or night sweats Eyes: blurry vision or double vision ENT: epistaxis or sore throat Hematologic/Lymphatic: bleeding problems, blood clots or swollen lymph nodes Endocrine: temperature intolerance or unexpected weight changes Breast: new or changing breast lumps or nipple discharge Resp: cough, shortness  of breath, or wheezing CV: chest pain or dyspnea on exertion GI: as per HPI GU: dysuria, trouble voiding, or hematuria Neuro: TIA or stroke symptoms    PE Blood pressure (!) 141/97, pulse 66, temperature 97.9 F (36.6 C), temperature source Oral, resp. rate 18, height 5\' 10"  (1.778 m), weight 89 kg, SpO2 94 %. Constitutional: NAD; conversant; no deformities Eyes: Moist conjunctiva; no lid lag; anicteric; PERRL Neck: Trachea midline; no thyromegaly Lungs: Normal respiratory effort CV: RRR GI: Abd soft MSK: Normal range of motion of extremities; no clubbing/cyanosis Psychiatric: Appropriate affect; alert and oriented x3  Results for orders placed or performed during the hospital encounter  of 12/31/22 (from the past 48 hour(s))  Glucose, capillary     Status: Abnormal   Collection Time: 12/31/22  7:00 AM  Result Value Ref Range   Glucose-Capillary 223 (H) 70 - 99 mg/dL    Comment: Glucose reference range applies only to samples taken after fasting for at least 8 hours.    No results found.   A/P: Steven [name] is an 52 y.o. male with chronic anastomotic leak after coloanal anastomosis.  We have discussed resection and end colostomy formation.  Significant risk of parastomal hernia discussed in detaill.  The surgery and anatomy were described to the patient as well as the risks of surgery and the possible complications.  These include: Bleeding, deep abdominal infections and possible wound complications such as hernia and infection, damage to adjacent structures, leak of surgical connections, which can lead to other surgeries and possibly an ostomy, possible need for other procedures, such as abscess drains in radiology, possible prolonged hospital stay, possible diarrhea from removal of part of the colon, possible constipation from narcotics, prolonged fatigue/weakness or appetite loss, possible early recurrence of of disease, possible complications of their medical problems such as heart disease or arrhythmias or lung problems, death (less than 1%). I believe the patient understands and wishes to proceed with the surgery.    Rosario Adie, MD  Colorectal and [place] [facility]

## 2022-12-31 NOTE — Anesthesia Procedure Notes (Signed)
Procedure Name: Intubation Date/Time: 12/31/2022 8:27 AM  Performed by: Jonna Munro, CRNAPre-anesthesia Checklist: Patient identified, Emergency Drugs available, Suction available, Patient being monitored and Timeout performed Patient Re-evaluated:Patient Re-evaluated prior to induction Oxygen Delivery Method: Circle system utilized Preoxygenation: Pre-oxygenation with 100% oxygen Induction Type: IV induction Ventilation: Mask ventilation without difficulty Laryngoscope Size: Mac and 3 Grade View: Grade II Tube type: Oral Tube size: 7.5 mm Number of attempts: 1 Airway Equipment and Method: Stylet Placement Confirmation: ETT inserted through vocal cords under direct vision, positive ETCO2, CO2 detector and breath sounds checked- equal and bilateral Secured at: 23 cm Tube secured with: Tape Dental Injury: Teeth and Oropharynx as per pre-operative assessment

## 2022-12-31 NOTE — Anesthesia Postprocedure Evaluation (Signed)
Anesthesia Post Note  Patient: Randee Coody  Procedure(s) Performed: ROBOTIC ASSISTED LOWER ANTERIOR RESECTION WITH OSTOMY with intraop ICG guided perfusion, takedown of iliostomy     Patient location during evaluation: PACU Anesthesia Type: General Level of consciousness: awake and alert, oriented and patient cooperative Pain management: pain level controlled Vital Signs Assessment: post-procedure vital signs reviewed and stable Respiratory status: spontaneous breathing, nonlabored ventilation and respiratory function stable Cardiovascular status: blood pressure returned to baseline and stable Postop Assessment: no apparent nausea or vomiting Anesthetic complications: no   No notable events documented.  Last Vitals:  Vitals:   12/31/22 1345 12/31/22 1410  BP: (!) 144/80 (!) 141/78  Pulse: 85 82  Resp: 10 (!) 9  Temp:  36.4 C  SpO2: 94% 98%    Last Pain:  Vitals:   12/31/22 1410  TempSrc: Oral  PainSc: [place]

## 2023-01-01 ENCOUNTER — Encounter (HOSPITAL_COMMUNITY): Payer: Self-pay | Admitting: General Surgery

## 2023-01-01 LAB — BASIC METABOLIC PANEL
Anion gap: 7 (ref 5–15)
BUN: 16 mg/dL (ref 6–20)
CO2: 23 mmol/L (ref 22–32)
Calcium: 8.5 mg/dL — ABNORMAL LOW (ref 8.9–10.3)
Chloride: 105 mmol/L (ref 98–111)
Creatinine, Ser: 1.03 mg/dL (ref 0.61–1.24)
GFR, Estimated: >60 mL/min (ref >60)
Glucose, Bld: 215 mg/dL — ABNORMAL HIGH (ref 70–99)
Potassium: 4.4 mmol/L (ref 3.5–5.1)
Sodium: 135 mmol/L (ref 135–145)

## 2023-01-01 LAB — GLUCOSE, CAPILLARY
Glucose-Capillary: 205 mg/dL — ABNORMAL HIGH (ref 70–99)
Glucose-Capillary: 221 mg/dL — ABNORMAL HIGH (ref 70–99)
Glucose-Capillary: 180 mg/dL — ABNORMAL HIGH (ref 70–99)
Glucose-Capillary: 190 mg/dL — ABNORMAL HIGH (ref 70–99)

## 2023-01-01 LAB — CBC
HCT: 36.3 % — ABNORMAL LOW (ref 39.0–52.0)
Hemoglobin: 11.7 g/dL — ABNORMAL LOW (ref 13.0–17.0)
MCH: 25.3 pg — ABNORMAL LOW (ref 26.0–34.0)
MCHC: 32.2 g/dL (ref 30.0–36.0)
MCV: 78.4 fL — ABNORMAL LOW (ref 80.0–100.0)
Platelets: 173 10*3/uL (ref 150–400)
RBC: 4.63 MIL/uL (ref 4.22–5.81)
RDW: 15.6 % — ABNORMAL HIGH (ref 11.5–15.5)
WBC: 8.3 10*3/uL (ref 4.0–10.5)
nRBC: 0 % (ref 0.0–0.2)

## 2023-01-01 NOTE — Progress Notes (Signed)
1 Day Post-Op Robotic takedown of LAR and colostomy Subjective: Having some pain around the ostomy site, no flatus, no nausea  Objective: Vital signs in last 24 hours: Temp:  [96.9 F (36.1 C)-98.9 F (37.2 C)] 98.7 F (37.1 C) (04/04 0604) Pulse Rate:  [82-105] 105 (04/04 0604) Resp:  [9-18] 18 (04/04 0604) BP: (106-144)/(71-80) 127/73 (04/04 0604) SpO2:  [93 %-99 %] 93 % (04/04 0604)   Intake/Output from previous day: 04/03 0701 - 04/04 0700 In: 3129.5 [P.O.:960; I.V.:2069.5; IV Piggyback:100] Out: 3230 [Urine:2430; Drains:700; Blood:100] Intake/Output this shift: No intake/output data recorded.   General appearance: alert and cooperative GI: soft, nondistended  Incision: ostomy beefy red  Lab Results:  Recent Labs    01/01/23 0427  WBC 8.3  HGB 11.7*  HCT 36.3*  PLT 173   BMET Recent Labs    01/01/23 0427  NA 135  K 4.4  CL 105  CO2 23  GLUCOSE 215*  BUN 16  CREATININE 1.03  CALCIUM 8.5*   PT/INR No results for input(s): "LABPROT", "INR" in the last 72 hours. ABG No results for input(s): "PHART", "HCO3" in the last 72 hours.  Invalid input(s): "PCO2", "PO2"  MEDS, Scheduled  alvimopan  12 mg Oral BID   dapagliflozin propanediol  5 mg Oral Q breakfast   And   metFORMIN  1,000 mg Oral Q breakfast   enoxaparin (LOVENOX) injection  40 mg Subcutaneous Q24H   feeding supplement  237 mL Oral BID BM   fenofibrate  160 mg Oral Daily   gabapentin  300 mg Oral QHS   insulin aspart  0-20 Units Subcutaneous TID WC   insulin aspart  0-5 Units Subcutaneous QHS   saccharomyces boulardii  250 mg Oral BID    Studies/Results: No results found.  Assessment: s/p Procedure(s): ROBOTIC ASSISTED LOWER ANTERIOR RESECTION WITH OSTOMY with intraop ICG guided perfusion, takedown of iliostomy Patient Active Problem List   Diagnosis Date Noted   Dehiscence of anastomosis of large intestine 12/31/2022   Peripheral neuropathy due to chemotherapy 07/07/2022    Cancer related pain 04/30/2021   Tenesmus 04/16/2021   Chemotherapy-induced nausea 04/03/2021   Adenocarcinoma of rectum 03/21/2021   Colon cancer screening 12/26/2020   Abnormal finding on CT scan 12/26/2020   History of rectal bleeding 12/26/2020   Gastroesophageal reflux disease 12/26/2020   Diabetes mellitus due to underlying condition, uncontrolled, with hyperglycemia, without long-term current use of insulin 04/26/2019   Obesity (BMI 30-39.9) 04/26/2019   Asthma    Anxiety    OSA (obstructive sleep apnea)    HLD (hyperlipidemia)    Bilateral pulmonary embolism 04/24/2019     Plan: Advance diet to fulls SLIVFs Ambulate in hall   LOS: 1 day     .Rosario Adie, MD [facility], [place]    01/01/2023 7:34 AM

## 2023-01-01 NOTE — TOC CM/SW Note (Signed)
Transition of Care [facility]) Screening Note  Patient Details  Name: Steven [name] Date of Birth: [birth year]  Transition of Care [facility]) CM/SW Contact:    Sherie Don, LCSW Phone Number: 01/01/2023, 10:16 AM  Transition of Care Department [facility]) has reviewed patient and no TOC needs have been identified at this time. We will continue to monitor patient advancement through interdisciplinary progression rounds. If new patient transition needs arise, please place a TOC consult.

## 2023-01-02 LAB — CBC
HCT: 37.7 % — ABNORMAL LOW (ref 39.0–52.0)
Hemoglobin: 11.6 g/dL — ABNORMAL LOW (ref 13.0–17.0)
MCH: 24.8 pg — ABNORMAL LOW (ref 26.0–34.0)
MCHC: 30.8 g/dL (ref 30.0–36.0)
MCV: 80.7 fL (ref 80.0–100.0)
Platelets: 162 10*3/uL (ref 150–400)
RBC: 4.67 MIL/uL (ref 4.22–5.81)
RDW: 16 % — ABNORMAL HIGH (ref 11.5–15.5)
WBC: 5.3 10*3/uL (ref 4.0–10.5)
nRBC: 0 % (ref 0.0–0.2)

## 2023-01-02 LAB — BASIC METABOLIC PANEL
Anion gap: 7 (ref 5–15)
BUN: 14 mg/dL (ref 6–20)
CO2: 25 mmol/L (ref 22–32)
Calcium: 8.5 mg/dL — ABNORMAL LOW (ref 8.9–10.3)
Chloride: 105 mmol/L (ref 98–111)
Creatinine, Ser: 1.05 mg/dL (ref 0.61–1.24)
GFR, Estimated: >60 mL/min (ref >60)
Glucose, Bld: 163 mg/dL — ABNORMAL HIGH (ref 70–99)
Potassium: 3.9 mmol/L (ref 3.5–5.1)
Sodium: 137 mmol/L (ref 135–145)

## 2023-01-02 LAB — GLUCOSE, CAPILLARY
Glucose-Capillary: 150 mg/dL — ABNORMAL HIGH (ref 70–99)
Glucose-Capillary: 221 mg/dL — ABNORMAL HIGH (ref 70–99)
Glucose-Capillary: 221 mg/dL — ABNORMAL HIGH (ref 70–99)
Glucose-Capillary: 234 mg/dL — ABNORMAL HIGH (ref 70–99)

## 2023-01-02 LAB — SURGICAL PATHOLOGY

## 2023-01-02 NOTE — Consult Note (Signed)
WOC Nurse ostomy consult note Stoma type/location: RLQ, colostomy Stomal assessment/size:  irregular shape, minimally budded, os at 7 o'clock and skin level Peristomal assessment: puckering of mucocutaneous junction at sutures, dips at 3-9 o'clock Tension blister (unroofed) from tape border at 5 o'clock   Treatment options for stomal/peristomal skin: crusted peristomal skin irritation and blistered area  Output formed green stool, active during pouch change. +flatus Ostomy pouching: 2pc. 2 3/4" with 2" skin barrier ring  Education provided: patient has had previous ostomy x 1.5 years.  Very good understanding of pouching, change frequency, care, peristomal skin care.  I did let him know about the dips in the plane of the abdominal topography and he may have to adjust pouching system or use convexity.  I will have [employer] send him 2pc convexity just in case and a new pair of curved scissors  Reports he has ample supplies at home  Enrolled patient in [employer] DC program: Yes, previously  WOC Nurse will follow along with you for continued support with ostomy teaching and care Iysha Mishkin [facility] MSN, RN, [place], CNS, [place] [phone]

## 2023-01-02 NOTE — Progress Notes (Signed)
2 Days Post-Op Robotic takedown of LAR and colostomy Subjective: Having some pain around the ostomy site and drain, some flatus, no nausea.  Having some abd cramping  Objective: Vital signs in last 24 hours: Temp:  [98.2 F (36.8 C)-98.7 F (37.1 C)] 98.2 F (36.8 C) (04/05 0447) Pulse Rate:  [99-108] 99 (04/05 0447) Resp:  [16-18] 18 (04/05 0447) BP: (113-131)/(71-80) 131/80 (04/05 0447) SpO2:  [95 %-98 %] 98 % (04/05 0447)   Intake/Output from previous day: 04/04 0701 - 04/05 0700 In: 500 [P.O.:500] Out: 3195 [Urine:3050; Drains:140; Stool:5] Intake/Output this shift: No intake/output data recorded.   General appearance: alert and cooperative GI: soft, nondistended  Incision: ostomy beefy red  Lab Results:  Recent Labs    01/01/23 0427 01/02/23 0435  WBC 8.3 5.3  HGB 11.7* 11.6*  HCT 36.3* 37.7*  PLT 173 162    BMET Recent Labs    01/01/23 0427 01/02/23 0435  NA 135 137  K 4.4 3.9  CL 105 105  CO2 23 25  GLUCOSE 215* 163*  BUN 16 14  CREATININE 1.03 1.05  CALCIUM 8.5* 8.5*    PT/INR No results for input(s): "LABPROT", "INR" in the last 72 hours. ABG No results for input(s): "PHART", "HCO3" in the last 72 hours.  Invalid input(s): "PCO2", "PO2"  MEDS, Scheduled  alvimopan  12 mg Oral BID   dapagliflozin propanediol  5 mg Oral Q breakfast   And   metFORMIN  1,000 mg Oral Q breakfast   enoxaparin (LOVENOX) injection  40 mg Subcutaneous Q24H   feeding supplement  237 mL Oral BID BM   fenofibrate  160 mg Oral Daily   gabapentin  300 mg Oral QHS   insulin aspart  0-20 Units Subcutaneous TID WC   insulin aspart  0-5 Units Subcutaneous QHS   saccharomyces boulardii  250 mg Oral BID    Studies/Results: No results found.  Assessment: s/p Procedure(s): ROBOTIC ASSISTED LOWER ANTERIOR RESECTION WITH OSTOMY with intraop ICG guided perfusion, takedown of iliostomy Patient Active Problem List   Diagnosis Date Noted   Dehiscence of anastomosis of  large intestine 12/31/2022   Peripheral neuropathy due to chemotherapy 07/07/2022   Cancer related pain 04/30/2021   Tenesmus 04/16/2021   Chemotherapy-induced nausea 04/03/2021   Adenocarcinoma of rectum 03/21/2021   Colon cancer screening 12/26/2020   Abnormal finding on CT scan 12/26/2020   History of rectal bleeding 12/26/2020   Gastroesophageal reflux disease 12/26/2020   Diabetes mellitus due to underlying condition, uncontrolled, with hyperglycemia, without long-term current use of insulin 04/26/2019   Obesity (BMI 30-39.9) 04/26/2019   Asthma    Anxiety    OSA (obstructive sleep apnea)    HLD (hyperlipidemia)    Bilateral pulmonary embolism 04/24/2019     Plan: Advance diet to reg SLIVFs Ambulate in hall PO pain meds Await return of bowel function   LOS: 2 days     .Vanita Panda, MD [facility], [place]    01/02/2023 8:23 AM

## 2023-01-02 NOTE — Progress Notes (Signed)
Mobility Specialist - Progress Note   01/02/23 0943  Mobility  Activity Ambulated with assistance in hallway  Level of Assistance Standby assist, set-up cues, supervision of patient - no hands on  Assistive Device Front wheel walker  Distance Ambulated (ft) 280 ft  Activity Response Tolerated well  Mobility Referral Yes  $Mobility charge 1 Mobility   Pt received in recliner and agreeable to mobility. C/o soreness around incisions during mobility. Pt to recliner after session with all needs met.    [facility]

## 2023-01-03 LAB — GLUCOSE, CAPILLARY
Glucose-Capillary: 157 mg/dL — ABNORMAL HIGH (ref 70–99)
Glucose-Capillary: 158 mg/dL — ABNORMAL HIGH (ref 70–99)

## 2023-01-03 MED ORDER — TRAMADOL HCL 50 MG PO TABS: 50.0000 mg | ORAL_TABLET | Freq: Four times a day (QID) | ORAL | 0 refills | Status: DC | PRN

## 2023-01-03 MED ORDER — DICYCLOMINE HCL 10 MG PO CAPS: 10.0000 mg | ORAL_CAPSULE | Freq: Three times a day (TID) | ORAL | 0 refills | Status: DC | PRN

## 2023-01-03 NOTE — Plan of Care (Signed)
Problem: Education: Goal: Understanding of discharge needs will improve Outcome: Adequate for Discharge Goal: Verbalization of understanding of the causes of altered bowel function will improve Outcome: Adequate for Discharge   Problem: Activity: Goal: Ability to tolerate increased activity will improve Outcome: Adequate for Discharge   Problem: Bowel/Gastric: Goal: Gastrointestinal status for postoperative course will improve Outcome: Adequate for Discharge   Problem: Health Behavior/Discharge Planning: Goal: Identification of community resources to assist with postoperative recovery needs will improve Outcome: Adequate for Discharge   Problem: Nutritional: Goal: Will attain and maintain optimal nutritional status will improve Outcome: Adequate for Discharge   Problem: Clinical Measurements: Goal: Postoperative complications will be avoided or minimized Outcome: Adequate for Discharge   Problem: Respiratory: Goal: Respiratory status will improve Outcome: Adequate for Discharge   Problem: Skin Integrity: Goal: Will show signs of wound healing Outcome: Adequate for Discharge   Problem: Education: Goal: Ability to describe self-care measures that may prevent or decrease complications (Diabetes Survival Skills Education) will improve Outcome: Adequate for Discharge Goal: Individualized Educational Video(s) Outcome: Adequate for Discharge   Problem: Coping: Goal: Ability to adjust to condition or change in health will improve Outcome: Adequate for Discharge   Problem: Fluid Volume: Goal: Ability to maintain a balanced intake and output will improve Outcome: Adequate for Discharge   Problem: Health Behavior/Discharge Planning: Goal: Ability to identify and utilize available resources and services will improve Outcome: Adequate for Discharge Goal: Ability to manage health-related needs will improve Outcome: Adequate for Discharge   Problem: Metabolic: Goal: Ability  to maintain appropriate glucose levels will improve Outcome: Adequate for Discharge   Problem: Nutritional: Goal: Maintenance of adequate nutrition will improve Outcome: Adequate for Discharge Goal: Progress toward achieving an optimal weight will improve Outcome: Adequate for Discharge   Problem: Skin Integrity: Goal: Risk for impaired skin integrity will decrease Outcome: Adequate for Discharge   Problem: Tissue Perfusion: Goal: Adequacy of tissue perfusion will improve Outcome: Adequate for Discharge   Problem: Education: Goal: Knowledge of General Education information will improve Description: Including pain rating scale, medication(s)/side effects and non-pharmacologic comfort measures Outcome: Adequate for Discharge   Problem: Health Behavior/Discharge Planning: Goal: Ability to manage health-related needs will improve Outcome: Adequate for Discharge   Problem: Clinical Measurements: Goal: Ability to maintain clinical measurements within normal limits will improve Outcome: Adequate for Discharge Goal: Will remain free from infection Outcome: Adequate for Discharge Goal: Diagnostic test results will improve Outcome: Adequate for Discharge Goal: Respiratory complications will improve Outcome: Adequate for Discharge Goal: Cardiovascular complication will be avoided Outcome: Adequate for Discharge   Problem: Activity: Goal: Risk for activity intolerance will decrease Outcome: Adequate for Discharge   Problem: Nutrition: Goal: Adequate nutrition will be maintained Outcome: Adequate for Discharge   Problem: Coping: Goal: Level of anxiety will decrease Outcome: Adequate for Discharge   Problem: Elimination: Goal: Will not experience complications related to bowel motility Outcome: Adequate for Discharge Goal: Will not experience complications related to urinary retention Outcome: Adequate for Discharge   Problem: Pain Managment: Goal: General experience of  comfort will improve Outcome: Adequate for Discharge   Problem: Safety: Goal: Ability to remain free from injury will improve Outcome: Adequate for Discharge   Problem: Skin Integrity: Goal: Risk for impaired skin integrity will decrease Outcome: Adequate for Discharge   Problem: Education: Goal: Knowledge of General Education information will improve Description: Including pain rating scale, medication(s)/side effects and non-pharmacologic comfort measures Outcome: Adequate for Discharge   Problem: Health Behavior/Discharge Planning: Goal: Ability to  manage health-related needs will improve Outcome: Adequate for Discharge   Problem: Clinical Measurements: Goal: Ability to maintain clinical measurements within normal limits will improve Outcome: Adequate for Discharge Goal: Will remain free from infection Outcome: Adequate for Discharge Goal: Diagnostic test results will improve Outcome: Adequate for Discharge Goal: Respiratory complications will improve Outcome: Adequate for Discharge Goal: Cardiovascular complication will be avoided Outcome: Adequate for Discharge   Problem: Activity: Goal: Risk for activity intolerance will decrease Outcome: Adequate for Discharge   Problem: Nutrition: Goal: Adequate nutrition will be maintained Outcome: Adequate for Discharge

## 2023-01-03 NOTE — Discharge Instructions (Signed)
SURGERY: POST OP INSTRUCTIONS (Surgery for small bowel obstruction, colon resection, etc)   ######################################################################  EAT Gradually transition to a high fiber diet with a fiber supplement over the next few days after discharge  WALK Walk an hour a day.  Control your pain to do that.    CONTROL PAIN Control pain so that you can walk, sleep, tolerate sneezing/coughing, go up/down stairs.  HAVE A BOWEL MOVEMENT DAILY Keep your bowels regular to avoid problems.  OK to try a laxative to override constipation.  OK to use an antidairrheal to slow down diarrhea.  Call if not better after 2 tries  CALL IF YOU HAVE PROBLEMS/CONCERNS Call if you are still struggling despite following these instructions. Call if you have concerns not answered by these instructions  ######################################################################   DIET Follow a light diet the first few days at home.  Start with a bland diet such as soups, liquids, starchy foods, low fat foods, etc.  If you feel full, bloated, or constipated, stay on a ful liquid or pureed/blenderized diet for a few days until you feel better and no longer constipated. Be sure to drink plenty of fluids every day to avoid getting dehydrated (feeling dizzy, not urinating, etc.). Gradually add a fiber supplement to your diet over the next week.  Gradually get back to a regular solid diet.  Avoid fast food or heavy meals the first week as you are more likely to get nauseated. It is expected for your digestive tract to need a few months to get back to normal.  It is common for your bowel movements and stools to be irregular.  You will have occasional bloating and cramping that should eventually fade away.  Until you are eating solid food normally, off all pain medications, and back to regular activities; your bowels will not be normal. Focus on eating a low-fat, high fiber diet the rest of your life  (See Getting to Good Bowel Health, below).  CARE of your INCISION or WOUND  It is good for closed incisions and even open wounds to be washed every day.  Shower every day.  Short baths are fine.  Wash the incisions and wounds clean with soap & water.    You may leave closed incisions open to air if it is dry.   You may cover the incision with clean gauze & replace it after your daily shower for comfort.  STAPLES: You have skin staples.  Leave them in place & set up an appointment for them to be removed by a surgery office nurse ~10 days after surgery. = 1st week of January 2024    ACTIVITIES as tolerated Start light daily activities --- self-care, walking, climbing stairs-- beginning the day after surgery.  Gradually increase activities as tolerated.  Control your pain to be active.  Stop when you are tired.  Ideally, walk several times a day, eventually an hour a day.   Most people are back to most day-to-day activities in a few weeks.  It takes 4-8 weeks to get back to unrestricted, intense activity. If you can walk 30 minutes without difficulty, it is safe to try more intense activity such as jogging, treadmill, bicycling, low-impact aerobics, swimming, etc. Save the most intensive and strenuous activity for last (Usually 4-8 weeks after surgery) such as sit-ups, heavy lifting, contact sports, etc.  Refrain from any intense heavy lifting or straining until you are off narcotics for pain control.  You will have off days, but things should improve   week-by-week. DO NOT PUSH THROUGH PAIN.  Let pain be your guide: If it hurts to do something, don't do it.  Pain is your body warning you to avoid that activity for another week until the pain goes down. You may drive when you are no longer taking narcotic prescription pain medication, you can comfortably wear a seatbelt, and you can safely make sudden turns/stops to protect yourself without hesitating due to pain. You may have sexual intercourse when it  is comfortable. If it hurts to do something, stop.  MEDICATIONS Take your usually prescribed home medications unless otherwise directed.   Blood thinners:  Usually you can restart any strong blood thinners after the second postoperative day.  It is OK to take aspirin right away.     If you are on strong blood thinners (warfarin/Coumadin, Plavix, Xerelto, Eliquis, Pradaxa, etc), discuss with your surgeon, medicine PCP, and/or cardiologist for instructions on when to restart the blood thinner & if blood monitoring is needed (PT/INR blood check, etc).     PAIN CONTROL Pain after surgery or related to activity is often due to strain/injury to muscle, tendon, nerves and/or incisions.  This pain is usually short-term and will improve in a few months.  To help speed the process of healing and to get back to regular activity more quickly, DO THE FOLLOWING THINGS TOGETHER: Increase activity gradually.  DO NOT PUSH THROUGH PAIN Use Ice and/or Heat Try Gentle Massage and/or Stretching Take over the counter pain medication Take Narcotic prescription pain medication for more severe pain  Good pain control = faster recovery.  It is better to take more medicine to be more active than to stay in bed all day to avoid medications.  Increase activity gradually Avoid heavy lifting at first, then increase to lifting as tolerated over the next 6 weeks. Do not "push through" the pain.  Listen to your body and avoid positions and maneuvers than reproduce the pain.  Wait a few days before trying something more intense Walking an hour a day is encouraged to help your body recover faster and more safely.  Start slowly and stop when getting sore.  If you can walk 30 minutes without stopping or pain, you can try more intense activity (running, jogging, aerobics, cycling, swimming, treadmill, sex, sports, weightlifting, etc.) Remember: If it hurts to do it, then don't do it! Use Ice and/or Heat You will have swelling and  bruising around the incisions.  This will take several weeks to resolve. Ice packs or heating pads (6-8 times a day, 30-60 minutes at a time) will help sooth soreness & bruising. Some people prefer to use ice alone, heat alone, or alternate between ice & heat.  Experiment and see what works best for you.  Consider trying ice for the first few days to help decrease swelling and bruising; then, switch to heat to help relax sore spots and speed recovery. Shower every day.  Short baths are fine.  It feels good!  Keep the incisions and wounds clean with soap & water.   Try Gentle Massage and/or Stretching Massage at the area of pain many times a day Stop if you feel pain - do not overdo it Take over the counter pain medication This helps the muscle and nerve tissues become less irritable and calm down faster Choose ONE of the following over-the-counter anti-inflammatory medications: Acetaminophen 500mg tabs (Tylenol) 1-2 pills with every meal and just before bedtime (avoid if you have liver problems or if you have   acetaminophen in you narcotic prescription) Naproxen 220mg tabs (ex. Aleve, Naprosyn) 1-2 pills twice a day (avoid if you have kidney, stomach, IBD, or bleeding problems) Ibuprofen 200mg tabs (ex. Advil, Motrin) 3-4 pills with every meal and just before bedtime (avoid if you have kidney, stomach, IBD, or bleeding problems) Take with food/snack several times a day as directed for at least 2 weeks to help keep pain / soreness down & more manageable. Take Narcotic prescription pain medication for more severe pain A prescription for strong pain control is often given to you upon discharge (for example: oxycodone/Percocet, hydrocodone/Norco/Vicodin, or tramadol/Ultram) Take your pain medication as prescribed. Be mindful that most narcotic prescriptions contain Tylenol (acetaminophen) as well - avoid taking too much Tylenol. If you are having problems/concerns with the prescription medicine (does  not control pain, nausea, vomiting, rash, itching, etc.), please call us [phone] to see if we need to switch you to a different pain medicine that will work better for you and/or control your side effects better. If you need a refill on your pain medication, you must call the office before 4 pm and on weekdays only.  By federal law, prescriptions for narcotics cannot be called into a pharmacy.  They must be filled out on paper & picked up from our office by the patient or authorized caretaker.  Prescriptions cannot be filled after 4 pm nor on weekends.    WHEN TO CALL US [phone] Severe uncontrolled or worsening pain  Fever over 101 F (38.5 C) Concerns with the incision: Worsening pain, redness, rash/hives, swelling, bleeding, or drainage Reactions / problems with new medications (itching, rash, hives, nausea, etc.) Nausea and/or vomiting Difficulty urinating Difficulty breathing Worsening fatigue, dizziness, lightheadedness, blurred vision Other concerns If you are not getting better after two weeks or are noticing you are getting worse, contact our office [phone] for further advice.  We may need to adjust your medications, re-evaluate you in the office, send you to the emergency room, or see what other things we can do to help. The clinic staff is available to answer your questions during regular business hours (8:30am-5pm).  Please don't hesitate to call and ask to speak to one of our nurses for clinical concerns.    A surgeon from [facility] is always on call at the hospitals 24 hours/day If you have a medical emergency, go to the nearest emergency room or call 911.  FOLLOW UP in our office One the day of your discharge from the hospital (or the next business weekday), please call [facility] to set up or confirm an appointment to see your surgeon in the office for a follow-up appointment.  Usually it is 2-3 weeks after your surgery.   If you  have skin staples at your incision(s), let the office know so we can set up a time in the office for the nurse to remove them (usually around 10 days after surgery). Make sure that you call for appointments the day of discharge (or the next business weekday) from the hospital to ensure a convenient appointment time. IF YOU HAVE DISABILITY OR FAMILY LEAVE FORMS, BRING THEM TO THE OFFICE FOR PROCESSING.  DO NOT GIVE THEM TO YOUR DOCTOR.  [facility] [address], [address], [place], [place]  [address] ? [phone] - Main [phone] - Toll Free,  [phone] - Fax [email or web address]    GETTING TO GOOD BOWEL HEALTH. It is expected for your digestive tract to   need a few months to get back to normal.  It is common for your bowel movements and stools to be irregular.  You will have occasional bloating and cramping that should eventually fade away.  Until you are eating solid food normally, off all pain medications, and back to regular activities; your bowels will not be normal.   Avoiding constipation The goal: ONE SOFT BOWEL MOVEMENT A DAY!    Drink plenty of fluids.  Choose water first. TAKE A FIBER SUPPLEMENT EVERY DAY THE REST OF YOUR LIFE During your first week back home, gradually add back a fiber supplement every day Experiment which form you can tolerate.   There are many forms such as powders, tablets, wafers, gummies, etc Psyllium bran (Metamucil), methylcellulose (Citrucel), Miralax or Glycolax, Benefiber, Flax Seed.  Adjust the dose week-by-week (1/2 dose/day to 6 doses a day) until you are moving your bowels 1-2 times a day.  Cut back the dose or try a different fiber product if it is giving you problems such as diarrhea or bloating. Sometimes a laxative is needed to help jump-start bowels if constipated until the fiber supplement can help regulate your bowels.  If you are tolerating eating & you are farting, it is okay to try a gentle  laxative such as double dose MiraLax, prune juice, or Milk of Magnesia.  Avoid using laxatives too often. Stool softeners can sometimes help counteract the constipating effects of narcotic pain medicines.  It can also cause diarrhea, so avoid using for too long. If you are still constipated despite taking fiber daily, eating solids, and a few doses of laxatives, call our office. Controlling diarrhea Try drinking liquids and eating bland foods for a few days to avoid stressing your intestines further. Avoid dairy products (especially milk & ice cream) for a short time.  The intestines often can lose the ability to digest lactose when stressed. Avoid foods that cause gassiness or bloating.  Typical foods include beans and other legumes, cabbage, broccoli, and dairy foods.  Avoid greasy, spicy, fast foods.  Every person has some sensitivity to other foods, so listen to your body and avoid those foods that trigger problems for you. Probiotics (such as active yogurt, Align, etc) may help repopulate the intestines and colon with normal bacteria and calm down a sensitive digestive tract Adding a fiber supplement gradually can help thicken stools by absorbing excess fluid and retrain the intestines to act more normally.  Slowly increase the dose over a few weeks.  Too much fiber too soon can backfire and cause cramping & bloating. It is okay to try and slow down diarrhea with a few doses of antidiarrheal medicines.   Bismuth subsalicylate (ex. Kayopectate, Pepto Bismol) for a few doses can help control diarrhea.  Avoid if pregnant.   Loperamide (Imodium) can slow down diarrhea.  Start with one tablet (2mg) first.  Avoid if you are having fevers or severe pain.  ILEOSTOMY PATIENTS WILL HAVE CHRONIC DIARRHEA since their colon is not in use.    Drink plenty of liquids.  You will need to drink even more glasses of water/liquid a day to avoid getting dehydrated. Record output from your ileostomy.  Expect to empty  the bag every 3-4 hours at first.  Most people with a permanent ileostomy empty their bag 4-6 times at the least.   Use antidiarrheal medicine (especially Imodium) several times a day to avoid getting dehydrated.  Start with a dose at bedtime & breakfast.  Adjust up or   down as needed.  Increase antidiarrheal medications as directed to avoid emptying the bag more than 8 times a day (every 3 hours). Work with your wound ostomy nurse to learn care for your ostomy.  See ostomy care instructions. TROUBLESHOOTING IRREGULAR BOWELS 1) Start with a soft & bland diet. No spicy, greasy, or fried foods.  2) Avoid gluten/wheat or dairy products from diet to see if symptoms improve. 3) Miralax 17gm or flax seed mixed in 8oz. water or juice-daily. May use 2-4 times a day as needed. 4) Gas-X, Phazyme, etc. as needed for gas & bloating.  5) Prilosec (omeprazole) over-the-counter as needed 6)  Consider probiotics (Align, Activa, etc) to help calm the bowels down  Call your doctor if you are getting worse or not getting better.  Sometimes further testing (cultures, endoscopy, X-ray studies, CT scans, bloodwork, etc.) may be needed to help diagnose and treat the cause of the diarrhea. [facility] [address], [address], [place], [place]  [address] [phone] - Main.    [phone]  - Toll Free.   [phone] - Fax [email or web address]   ###############################   #######################################################  Ostomy Support Information  You've heard that people get along just fine with only one of their eyes, or one of their lungs, or one of their kidneys. But you also know that you have only one intestine and only one bladder, and that leaves you feeling awfully empty, both physically and emotionally: You think no other people go around without part of their intestine with the ends of their intestines sticking out through their abdominal walls.    YOU ARE NOT ALONE.  There are nearly three quarters of a million people in the [place] who have an ostomy; people who have had surgery to remove all or part of their colons or bladders.   There is even a national association, the [employer] with over 350 local affiliated support groups that are organized by volunteers who provide peer support and counseling. [employer] has a toll free telephone num-ber, 800-826-0826 and an educational, interactive website, www.ostomy.org   An ostomy is an opening in the belly (abdominal wall) made by surgery. Ostomates are people who have had this procedure. The opening (stoma) allows the kidney or bowel to grdischarge waste. An external pouch covers the stoma to collect waste. Pouches are are a simple bag and are odor free. Different companies have disposable or reusable pouches to fit one's lifestyle. An ostomy can either be temporary or permanent.   THERE ARE THREE MAIN TYPES OF OSTOMIES Colostomy. A colostomy is a surgically created opening in the large intestine (colon). Ileostomy. An ileostomy is a surgically created opening in the small intestine. Urostomy. A urostomy is a surgically created opening to divert urine away from the bladder.  OSTOMY Care  The following guidelines will make care of your colostomy easier. Keep this information close by for quick reference.  Helpful DIET hints Eat a well-balanced diet including vegetables and fresh fruits. Eat on a regular schedule.  Drink at least 6 to 8 glasses of fluids daily. Eat slowly in a relaxed atmosphere. Chew your food thoroughly. Avoid chewing gum, smoking, and drinking from a straw. This will help decrease the amount of air you swallow, which may help reduce gas. Eating yogurt or drinking buttermilk may help reduce gas.  To control gas at night, do not eat after 8 p.m. This will give your bowel time to quiet down before you go   to bed.  If gas is a problem, you can purchase  Beano. Sprinkle Beano on the first bite of food before eating to reduce gas. It has no flavor and should not change the taste of your food. You can buy Beano over the counter at your local drugstore.  Foods like fish, onions, garlic, broccoli, asparagus, and cabbage produce odor. Although your pouch is odor-proof, if you eat these foods you may notice a stronger odor when emptying your pouch. If this is a concern, you may want to limit these foods in your diet.  If you have an ileostomy, you will have chronic diarrhea & need to drink more liquids to avoid getting dehydrated.  Consider antidiarrheal medicine like imodium (loperamide) or Lomotil to help slow down bowel movements / diarrhea into your ileostomy bag.  GETTING TO GOOD BOWEL HEALTH WITH AN ILEOSTOMY    With the colon bypassed & not in use, you will have small bowel diarrhea.   It is important to thicken & slow your bowel movements down.   The goal: 4-6 small BOWEL MOVEMENTS A DAY It is important to drink plenty of liquids to avoid getting dehydrated  CONTROLLING ILEOSTOMY DIARRHEA  TAKE A FIBER SUPPLEMENT (FiberCon or Benefiner soluble fiber) twice a day - to thicken stools by absorbing excess fluid and retrain the intestines to act more normally.  Slowly increase the dose over a few weeks.  Too much fiber too soon can backfire and cause cramping & bloating.  TAKE AN IRON SUPPLEMENT twice a day to naturally constipate your bowels.  Usually ferrous sulfate 325mg twice a day)  TAKE ANTI-DIARRHEAL MEDICINES: Loperamide (Imodium) can slow down diarrhea.  Start with two tablets (= 4mg) first and then try one tablet every 6 hours.  Can go up to 2 pills four times day (8 pills of 2mg max) Avoid if you are having fevers or severe pain.  If you are not better or start feeling worse, stop all medicines and call your doctor for advice LoMotil (Diphenoxylate / Atropine) is another medicine that can constipate & slow down bowel moevements Pepto  Bismol (bismuth) can gently thicken bowels as well  If diarrhea is worse,: drink plenty of liquids and try simpler foods for a few days to avoid stressing your intestines further. Avoid dairy products (especially milk & ice cream) for a short time.  The intestines often can lose the ability to digest lactose when stressed. Avoid foods that cause gassiness or bloating.  Typical foods include beans and other legumes, cabbage, broccoli, and dairy foods.  Every person has some sensitivity to other foods, so listen to our body and avoid those foods that trigger problems for you.Call your doctor if you are getting worse or not better.  Sometimes further testing (cultures, endoscopy, X-ray studies, bloodwork, etc) may be needed to help diagnose and treat the cause of the diarrhea. Take extra anti-diarrheal medicines (maximum is 8 pills of 2mg loperamide a day)   Tips for POUCHING an OSTOMY   Changing Your Pouch The best time to change your pouch is in the morning, before eating or drinking anything. Your stoma can function at any time, but it will function more after eating or drinking.   Applying the pouching system  Place all your equipment close at hand before removing your pouch.  Wash your hands.  Stand or sit in front of a mirror. Use the position that works best for you. Remember that you must keep the skin around the stoma   wrinkle-free for a good seal.  Gently remove the used pouch (1-piece system) or the pouch and old wafer (2-piece system). Empty the pouch into the toilet. Save the closure clip to use again.  Wash the stoma itself and the skin around the stoma. Your stoma may bleed a little when being washed. This is normal. Rinse and pat dry. You may use a wash cloth or soft paper towels (like Bounty), mild soap (like Dial, Safeguard, or Ivory), and water. Avoid soaps that contain perfumes or lotions.  For a new pouch (1-piece system) or a new wafer (2-piece system), measure your  stoma using the stoma guide in each box of supplies.  Trace the shape of your stoma onto the back of the new pouch or the back of the new wafer. Cut out the opening. Remove the paper backing and set it aside.  Optional: Apply a skin barrier powder to surrounding skin if it is irritated (bare or weeping), and dust off the excess. Optional: Apply a skin-prep wipe (such as Skin Prep or All-Kare) to the skin around the stoma, and let it dry. Do not apply this solution if the skin is irritated (red, tender, or broken) or if you have shaved around the stoma. Optional: Apply a skin barrier paste (such as Stomahesive, Coloplast, or Premium) around the opening cut in the back of the pouch or wafer. Allow it to dry for 30 to 60 seconds.  Hold the pouch (1-piece system) or wafer (2-piece system) with the sticky side toward your body. Make sure the skin around the stoma is wrinkle-free. Center the opening on the stoma, then press firmly to your abdomen (Fig. 4). Look in the mirror to check if you are placing the pouch, or wafer, in the right position. For a 2-piece system, snap the pouch onto the wafer. Make sure it snaps into place securely.  Place your hand over the stoma and the pouch or wafer for about 30 seconds. The heat from your hand can help the pouch or wafer stick to your skin.  Add deodorant (such as Super Banish or Nullo) to your pouch. Other options include food extracts such as vanilla oil and peppermint extract. Add about 10 drops of the deodorant to the pouch. Then apply the closure clamp. Note: Do not use toxic  chemicals or commercial cleaning agents in your pouch. These substances may harm the stoma.  Optional: For extra seal, apply tape to all 4 sides around the pouch or wafer, as if you were framing a picture. You may use any brand of medical adhesive tape. Change your pouch every 5 to 7 days. Change it immediately if a leak occurs.  Wash your hands afterwards.  If you are wearing a  2-piece system, you may use 2 new pouches per week and alternate them. Rinse the pouch with mild soap and warm water and hang it to dry for the next day. Apply the fresh pouch. Alternate the 2 pouches like this for a week. After a week, change the wafer and begin with 2 new pouches. Place the old pouches in a plastic bag, and put them in the trash.   LIVING WITH AN OSTOMY  Emptying Your Pouch Empty your pouch when it is one-third full (of urine, stool, and/or gas). If you wait until your pouch is fuller than this, it will be more difficult to empty and more noticeable. When you empty your pouch, either put toilet paper in the toilet bowl first, or flush the   toilet while you empty the pouch. This will reduce splashing. You can empty the pouch between your legs or to one side while sitting, or while standing or stooping. If you have a 2-piece system, you can snap off the pouch to empty it. Remember that your stoma may function during this time. If you wish to rinse your pouch after you empty it, a turkey baster can be helpful. When using a baster, squirt water up into the pouch through the opening at the bottom. With a 2-piece system, you can snap off the pouch to rinse it. After rinsing  your pouch, empty it into the toilet. When rinsing your pouch at home, put a few granules of Dreft soap in the rinse water. This helps lubricate and freshen your pouch. The inside of your pouch can be sprayed with non-stick cooking oil (Pam spray). This may help reduce stool sticking to the inside of the pouch.  Bathing You may shower or bathe with your pouch on or off. Remember that your stoma may function during this time.  The materials you use to wash your stoma and the skin around it should be clean, but they do not need to be sterile.  Wearing Your Pouch During hot weather, or if you perspire a lot in general, wear a cover over your pouch. This may prevent a rash on your skin under the pouch. Pouch covers are  sold at ostomy supply stores. Wear the pouch inside your underwear for better support. Watch your weight. Any gain or loss of 10 to 15 pounds or more can change the way your pouch fits.  Going Away From Home A collapsible cup (like those that come in travel kits) or a soft plastic squirt bottle with a pull-up top (like a travel bottle for shampoo) can be used for rinsing your pouch when you are away from home. Tilt the opening of the pouch at an upward angle when using a cup to rinse.  Carry wet wipes or extra tissues to use in public bathrooms.  Carry an extra pouching system with you at all times.  Never keep ostomy supplies in the glove compartment of your car. Extreme heat or cold can damage the skin barriers and adhesive wafers on the pouch.  When you travel, carry your ostomy supplies with you at all times. Keep them within easy reach. Do not pack ostomy supplies in baggage that will be checked or otherwise separated from you, because your baggage might be lost. If you're traveling out of the country, it is helpful to have a letter stating that you are carrying ostomy supplies as a medical necessity.  If you need ostomy supplies while traveling, look in the yellow pages of the telephone book under "Surgical Supplies." Or call the local ostomy organization to find out where supplies are available.  Do not let your ostomy supplies get low. Always order new pouches before you use the last one.  Reducing Odor Limit foods such as broccoli, cabbage, onions, fish, and garlic in your diet to help reduce odor. Each time you empty your pouch, carefully clean the opening of the pouch, both inside and outside, with toilet paper. Rinse your pouch 1 or 2 times daily after you empty it (see directions for emptying your pouch and going away from home). Add deodorant (such as Super Banish or Nullo) to your pouch. Use air deodorizers in your bathroom. Do not add aspirin to your pouch. Even though  aspirin can help prevent odor, it   could cause ulcers on your stoma.  When to call the doctor Call the doctor if you have any of the following symptoms: Purple, black, or white stoma Severe cramps lasting more than 6 hours Severe watery discharge from the stoma lasting more than 6 hours No output from the colostomy for 3 days Excessive bleeding from your stoma Swelling of your stoma to more than 1/2-inch larger than usual Pulling inward of your stoma below skin level Severe skin irritation or deep ulcers Bulging or other changes in your abdomen  When to call your ostomy nurse Call your ostomy/enterostomal therapy (WOCN) nurse if any of the following occurs: Frequent leaking of your pouching system Change in size or appearance of your stoma, causing discomfort or problems with your pouch Skin rash or rawness Weight gain or loss that causes problems with your pouch     FREQUENTLY ASKED QUESTIONS   Why haven't you met any of these folks who have an ostomy?  Well, maybe you have! You just did not recognize them because an ostomy doesn't show. It can be kept secret if you wish. Why, maybe some of your best friends, office associates or neighbors have an ostomy ... you never can tell. People facing ostomy surgery have many quality-of-life questions like: Will you bulge? Smell? Make noises? Will you feel waste leaving your body? Will you be a captive of the toilet? Will you starve? Be a social outcast? Get/stay married? Have babies? Easily bathe, go swimming, bend over?  OK, let's look at what you can expect:   Will you bulge?  Remember, without part of the intestine or bladder, and its contents, you should have a flatter tummy than before. You can expect to wear, with little exception, what you wore before surgery ... and this in-cludes tight clothing and bathing suits.   Will you smell?  Today, thanks to modern odor proof pouching systems, you can walk into an ostomy support group  meeting and not smell anything that is foul or offensive. And, for those with an ileostomy or colostomy who are concerned about odor when emptying their pouch, there are in-pouch deodorants that can be used to eliminate any waste odors that may exist.   Will you make noises?  Everyone produces gas, especially if they are an air-swallower. But intestinal sounds that occur from time to time are no differ-ent than a gurgling tummy, and quite often your clothing will muffle any sounds.   Will you feel the waste discharges?  For those with a colostomy or ileostomy there might be a slight pressure when waste leaves your body, but understand that the intestines have no nerve endings, so there will be no unpleasant sensations. Those with a urostomy will probably be unaware of any kidney drainage.   Will you be a captive of the toilet?  Immediately post-op you will spend more time in the bathroom than you will after your body recovers from surgery. Every person is different, but on average those with an ileostomy or urostomy may empty their pouches 4 to 6 times a day; a little  less if you have a colostomy. The average wear time between pouch system changes is 3 to 5 days and the changing process should take less than 30 minutes.   Will I need to be on a special diet? Most people return to their normal diet when they have recovered from surgery. Be sure to chew your food well, eat a well-balanced diet and drink plenty of fluids. If   you experience problems with a certain food, wait a couple of weeks and try it again.  Will there be odor and noises? Pouching systems are designed to be odor-proof or odor-resistant. There are deodorants that can be used in the pouch. Medications are also available to help reduce odor. Limit gas-producing foods and carbonated beverages. You will experience less gas and fewer noises as you heal from surgery.  How much time will it take to care for my ostomy? At first, you may  spend a lot of time learning about your ostomy and how to take care of it. As you become more comfortable and skilled at changing the pouching system, it will take very little time to care for it.   Will I be able to return to work? People with ostomies can perform most jobs. As soon as you have healed from surgery, you should be able to return to work. Heavy lifting (more than 10 pounds) may be discouraged.   What about intimacy? Sexual relationships and intimacy are important and fulfilling aspects of your life. They should continue after ostomy surgery. Intimacy-related concerns should be discussed openly between you and your partner.   Can I wear regular clothing? You do not need to wear special clothing. Ostomy pouches are fairly flat and barely noticeable. Elastic undergarments will not hurt the stoma or prevent the ostomy from functioning.   Can I participate in sports? An ostomy should not limit your involvement in sports. Many people with ostomies are runners, skiers, swimmers or participate in other active lifestyles. Talk with your caregiver first before doing heavy physical activity.  Will you starve?  Not if you follow doctor's orders at each stage of your post-op adjustment. There is no such thing as an "ostomy diet". Some people with an ostomy will be able to eat and tolerate anything; others may find diffi-culty with some foods. Each person is an individual and must determine, by trial, what is best for them. A good practice for all is to drink plenty of water.   Will you be a social outcast?  Have you met anyone who has an ostomy and is a social outcast? Why should you be the first? Only your attitude and self image will effect how you are treated. No confi-dent person is an outcast.    PROFESSIONAL HELP   Resources are available if you need help or have questions about your ostomy.   Specially trained nurses called Wound, Ostomy Continence Nurses (WOCN) are available for  consultation in most major medical centers.  Consider getting an ostomy consult at an outpatient ostomy clinic.   [facility] has an Ostomy Clinic run by an WOCN ostomy nurse at the  Hospital campus.  [phone]. [facility] can help set up an appointment   The [employer] Ostomy Association (UOA) is a group made up of many local chapters throughout the [place]. These local groups hold meetings and provide support to prospective and existing ostomates. They sponsor educational events and have qualified visitors to make personal or telephone visits. Contact the UOA for the chapter nearest you and for other educational publications.  More detailed information can be found in Colostomy Guide, a publication of the [employer] Ostomy Association (UOA). Contact UOA at 1-800-826-0826 or visit their web site at www.uoaa.org. The website contains links to other sites, suppliers and resources.  [facility] Secure Start Services: Start at the website to enlist for support.  Your Wound Ostomy (WOCN) nurse may have started this   process. https://www.[employer].com/en/securestart Secure Start services are designed to support people as they live their lives with an ostomy or neurogenic bladder. Enrolling is easy and at no cost to the patient. We realize that each person's needs and life journey are different. Through Secure Start services, we want to help people live their life, their way.  #######################################################  

## 2023-01-03 NOTE — Progress Notes (Signed)
Mobility Specialist - Progress Note    01/03/23 1047  Mobility  Activity Ambulated with assistance in hallway  Level of Assistance Standby assist, set-up cues, supervision of patient - no hands on  Assistive Device None  Distance Ambulated (ft) 280 ft  Activity Response Tolerated well   Pt received in bed, agreeable to ambulate. Pt c/o of 3 out of 10 pain in abdomen. At EOS, pt returned to bed and was left with all necessities in reach.   Arliss Journey Mobility Specialist Acute Rehabilitation Services Phone: [phone] 01/03/23, 10:50 AM

## 2023-01-03 NOTE — Plan of Care (Signed)
Problem: Education: Goal: Understanding of discharge needs will improve Outcome: Adequate for Discharge Goal: Verbalization of understanding of the causes of altered bowel function will improve Outcome: Adequate for Discharge   Problem: Activity: Goal: Ability to tolerate increased activity will improve Outcome: Adequate for Discharge   Problem: Bowel/Gastric: Goal: Gastrointestinal status for postoperative course will improve Outcome: Adequate for Discharge   Problem: Health Behavior/Discharge Planning: Goal: Identification of community resources to assist with postoperative recovery needs will improve Outcome: Adequate for Discharge   Problem: Nutritional: Goal: Will attain and maintain optimal nutritional status will improve Outcome: Adequate for Discharge   Problem: Clinical Measurements: Goal: Postoperative complications will be avoided or minimized Outcome: Adequate for Discharge   Problem: Respiratory: Goal: Respiratory status will improve Outcome: Adequate for Discharge   Problem: Skin Integrity: Goal: Will show signs of wound healing Outcome: Adequate for Discharge   Problem: Education: Goal: Ability to describe self-care measures that may prevent or decrease complications (Diabetes Survival Skills Education) will improve Outcome: Adequate for Discharge Goal: Individualized Educational Video(s) Outcome: Adequate for Discharge   Problem: Coping: Goal: Ability to adjust to condition or change in health will improve Outcome: Adequate for Discharge   Problem: Fluid Volume: Goal: Ability to maintain a balanced intake and output will improve Outcome: Adequate for Discharge   Problem: Health Behavior/Discharge Planning: Goal: Ability to identify and utilize available resources and services will improve Outcome: Adequate for Discharge Goal: Ability to manage health-related needs will improve Outcome: Adequate for Discharge   Problem: Metabolic: Goal: Ability  to maintain appropriate glucose levels will improve Outcome: Adequate for Discharge   Problem: Nutritional: Goal: Maintenance of adequate nutrition will improve Outcome: Adequate for Discharge Goal: Progress toward achieving an optimal weight will improve Outcome: Adequate for Discharge   Problem: Skin Integrity: Goal: Risk for impaired skin integrity will decrease Outcome: Adequate for Discharge   Problem: Tissue Perfusion: Goal: Adequacy of tissue perfusion will improve Outcome: Adequate for Discharge   Problem: Education: Goal: Knowledge of General Education information will improve Description: Including pain rating scale, medication(s)/side effects and non-pharmacologic comfort measures Outcome: Adequate for Discharge   Problem: Health Behavior/Discharge Planning: Goal: Ability to manage health-related needs will improve Outcome: Adequate for Discharge   Problem: Clinical Measurements: Goal: Ability to maintain clinical measurements within normal limits will improve Outcome: Adequate for Discharge Goal: Will remain free from infection Outcome: Adequate for Discharge Goal: Diagnostic test results will improve Outcome: Adequate for Discharge Goal: Respiratory complications will improve Outcome: Adequate for Discharge Goal: Cardiovascular complication will be avoided Outcome: Adequate for Discharge   Problem: Activity: Goal: Risk for activity intolerance will decrease Outcome: Adequate for Discharge   Problem: Nutrition: Goal: Adequate nutrition will be maintained Outcome: Adequate for Discharge   Problem: Coping: Goal: Level of anxiety will decrease Outcome: Adequate for Discharge   Problem: Elimination: Goal: Will not experience complications related to bowel motility Outcome: Adequate for Discharge Goal: Will not experience complications related to urinary retention Outcome: Adequate for Discharge   Problem: Pain Managment: Goal: General experience of  comfort will improve Outcome: Adequate for Discharge   Problem: Safety: Goal: Ability to remain free from injury will improve Outcome: Adequate for Discharge   Problem: Skin Integrity: Goal: Risk for impaired skin integrity will decrease Outcome: Adequate for Discharge   Problem: Education: Goal: Knowledge of General Education information will improve Description: Including pain rating scale, medication(s)/side effects and non-pharmacologic comfort measures Outcome: Adequate for Discharge   Problem: Health Behavior/Discharge Planning: Goal: Ability to  manage health-related needs will improve Outcome: Adequate for Discharge   Problem: Clinical Measurements: Goal: Ability to maintain clinical measurements within normal limits will improve Outcome: Adequate for Discharge Goal: Will remain free from infection Outcome: Adequate for Discharge Goal: Diagnostic test results will improve Outcome: Adequate for Discharge Goal: Respiratory complications will improve Outcome: Adequate for Discharge Goal: Cardiovascular complication will be avoided Outcome: Adequate for Discharge   Problem: Activity: Goal: Risk for activity intolerance will decrease Outcome: Adequate for Discharge   Problem: Nutrition: Goal: Adequate nutrition will be maintained Outcome: Adequate for Discharge   Problem: Coping: Goal: Level of anxiety will decrease Outcome: Adequate for Discharge   Problem: Elimination: Goal: Will not experience complications related to bowel motility Outcome: Adequate for Discharge Goal: Will not experience complications related to urinary retention Outcome: Adequate for Discharge   Problem: Pain Managment: Goal: General experience of comfort will improve Outcome: Adequate for Discharge   Problem: Safety: Goal: Ability to remain free from injury will improve Outcome: Adequate for Discharge   Problem: Skin Integrity: Goal: Risk for impaired skin integrity will  decrease Outcome: Adequate for Discharge

## 2023-01-03 NOTE — Discharge Summary (Signed)
Physician Discharge Summary  Patient ID: Steven [name] MRN: [id number] DOB/AGE: 52-May-1972 52 y.o.  Admit date: 12/31/2022 Discharge date: 01/03/2023  Admission Diagnoses: anastomotic dehiscence   Discharge Diagnoses:  Principal Problem:   Dehiscence of anastomosis of large intestine   Discharged Condition: good  Hospital Course: Patient was admitted to the med surg floor after surgery.  Diet was advanced as tolerated.  Patient began to have bowel function on postop day 2.  By postop day 3, he was tolerating a solid diet and pain was controlled with oral medications.  He was urinating without difficulty and ambulating without assistance.  Patient was felt to be in stable condition for discharge to home.   Consults: None  Significant Diagnostic Studies: labs: cbc, bmet  Treatments: IV hydration, analgesia: acetaminophen, and surgery: see op note  Discharge Exam: Blood pressure 123/81, pulse 96, temperature 98.7 F (37.1 C), resp. rate 14, height 5\' 10"  (1.778 m), weight 92.5 kg, SpO2 98 %. General appearance: alert and cooperative GI: soft, ost beefy red Incision/Wound: c/d/i  Disposition: Discharge disposition: 01-Home or Self Care        Allergies as of 01/03/2023       Reactions   Contrast Media [iodinated Contrast Media] Hives, Nausea Only   As a 52 year old experienced hives and nausea        Medication List     STOP taking these medications    loperamide 2 MG capsule Commonly known as: IMODIUM       TAKE these medications    acetaminophen 500 MG tablet Commonly known as: TYLENOL Take 1,000 mg by mouth every 6 (six) hours as needed for mild pain.   ALPRAZolam 0.5 MG tablet Commonly known as: XANAX Take 0.5 mg by mouth daily as needed for anxiety.   blood glucose meter kit and supplies Kit Dispense based on patient and insurance preference. Use up to four times daily as directed. (FOR ICD-9 250.00, 250.01).   calcium carbonate 500 MG chewable  tablet Commonly known as: TUMS - dosed in mg elemental calcium Chew 2 tablets by mouth daily as needed for indigestion or heartburn.   dicyclomine 10 MG capsule Commonly known as: BENTYL Take 1 capsule (10 mg total) by mouth 3 (three) times daily as needed for spasms. Take before meals   fenofibrate 160 MG tablet Take 160 mg by mouth daily.   gabapentin 300 MG capsule Commonly known as: NEURONTIN TAKE 1 CAPSULE(300 MG) BY MOUTH AT BEDTIME   ibuprofen 200 MG tablet Commonly known as: ADVIL Take 600 mg by mouth every 6 (six) hours as needed for moderate pain.   neomycin-bacitracin-polymyxin Oint Commonly known as: NEOSPORIN Apply 1 Application topically as needed for wound care.   ProAir HFA 108 (90 Base) MCG/ACT inhaler Generic drug: albuterol Inhale 2 puffs into the lungs every 4 (four) hours as needed for wheezing or shortness of breath.   tetrahydrozoline-zinc 0.05-0.25 % ophthalmic solution Commonly known as: VISINE-AC Place 2 drops into both eyes 4 (four) times daily as needed (allergy).   traMADol 50 MG tablet Commonly known as: ULTRAM Take 1-2 tablets (50-100 mg total) by mouth every 6 (six) hours as needed for moderate pain.   Xigduo XR 01-999 MG Tb24 Generic drug: Dapagliflozin Pro-metFORMIN ER Take 1 tablet by mouth 2 (two) times daily with a meal.         Signed: Vanita Panda 01/03/2023, 8:37 AM

## 2023-01-03 NOTE — Progress Notes (Signed)
Assessment unchanged. Pt verbalized understanding of dc instructions including medications, follow up care and when to call the doctor, as well as drain care. Discharged via wc to front entrance accompanied by NT.

## 2023-01-03 NOTE — Progress Notes (Signed)
Pharmacy Brief Note - Alvimopan (Entereg)  The standing order set for alvimopan (Entereg) now includes an automatic order to discontinue the drug after the patient has had a bowel movement. The change was approved by the Pharmacy & Therapeutics Committee and the Medical Executive Committee.   This patient has had bowel movements documented by nursing. Therefore, alvimopan has been discontinued. If there are questions, please contact the pharmacy at [phone].   Thank you-  Dorna Leitz, PharmD, BCPS 01/03/2023 8:38 AM

## 2023-01-11 ENCOUNTER — Emergency Department (HOSPITAL_COMMUNITY): Payer: BC Managed Care – PPO

## 2023-01-11 ENCOUNTER — Inpatient Hospital Stay (HOSPITAL_COMMUNITY)
Admission: EM | Admit: 2023-01-11 | Discharge: 2023-01-13 | DRG: 921 | Disposition: A | Discharge status: Home / Self Care | Payer: BC Managed Care – PPO | Attending: General Surgery | Admitting: General Surgery

## 2023-01-11 ENCOUNTER — Encounter (HOSPITAL_COMMUNITY): Payer: Self-pay

## 2023-01-11 ENCOUNTER — Other Ambulatory Visit: Payer: Self-pay

## 2023-01-11 DIAGNOSIS — E1151 Type 2 diabetes mellitus with diabetic peripheral angiopathy without gangrene: Secondary | ICD-10-CM | POA: Diagnosis not present

## 2023-01-11 DIAGNOSIS — T8143XA Infection following a procedure, organ and space surgical site, initial encounter: Secondary | ICD-10-CM | POA: Diagnosis not present

## 2023-01-11 DIAGNOSIS — R109 Unspecified abdominal pain: Secondary | ICD-10-CM | POA: Diagnosis not present

## 2023-01-11 DIAGNOSIS — Z79899 Other long term (current) drug therapy: Secondary | ICD-10-CM

## 2023-01-11 DIAGNOSIS — L89151 Pressure ulcer of sacral region, stage 1: Secondary | ICD-10-CM

## 2023-01-11 DIAGNOSIS — Z91041 Radiographic dye allergy status: Secondary | ICD-10-CM

## 2023-01-11 DIAGNOSIS — E785 Hyperlipidemia, unspecified: Secondary | ICD-10-CM | POA: Diagnosis not present

## 2023-01-11 DIAGNOSIS — Z833 Family history of diabetes mellitus: Secondary | ICD-10-CM | POA: Diagnosis not present

## 2023-01-11 DIAGNOSIS — G4733 Obstructive sleep apnea (adult) (pediatric): Secondary | ICD-10-CM | POA: Diagnosis present

## 2023-01-11 DIAGNOSIS — I10 Essential (primary) hypertension: Secondary | ICD-10-CM | POA: Diagnosis present

## 2023-01-11 DIAGNOSIS — T8140XA Infection following a procedure, unspecified, initial encounter: Principal | ICD-10-CM

## 2023-01-11 DIAGNOSIS — F419 Anxiety disorder, unspecified: Secondary | ICD-10-CM | POA: Diagnosis not present

## 2023-01-11 DIAGNOSIS — Z85048 Personal history of other malignant neoplasm of rectum, rectosigmoid junction, and anus: Secondary | ICD-10-CM

## 2023-01-11 DIAGNOSIS — Z8249 Family history of ischemic heart disease and other diseases of the circulatory system: Secondary | ICD-10-CM

## 2023-01-11 DIAGNOSIS — C20 Malignant neoplasm of rectum: Secondary | ICD-10-CM | POA: Diagnosis not present

## 2023-01-11 DIAGNOSIS — K91870 Postprocedural hematoma of a digestive system organ or structure following a digestive system procedure: Principal | ICD-10-CM | POA: Diagnosis present

## 2023-01-11 DIAGNOSIS — Z933 Colostomy status: Secondary | ICD-10-CM | POA: Diagnosis not present

## 2023-01-11 DIAGNOSIS — Y833 Surgical operation with formation of external stoma as the cause of abnormal reaction of the patient, or of later complication, without mention of misadventure at the time of the procedure: Secondary | ICD-10-CM | POA: Diagnosis not present

## 2023-01-11 DIAGNOSIS — Z0389 Encounter for observation for other suspected diseases and conditions ruled out: Secondary | ICD-10-CM | POA: Diagnosis not present

## 2023-01-11 DIAGNOSIS — J45909 Unspecified asthma, uncomplicated: Secondary | ICD-10-CM | POA: Diagnosis not present

## 2023-01-11 DIAGNOSIS — Z803 Family history of malignant neoplasm of breast: Secondary | ICD-10-CM

## 2023-01-11 DIAGNOSIS — Z7984 Long term (current) use of oral hypoglycemic drugs: Secondary | ICD-10-CM | POA: Diagnosis not present

## 2023-01-11 DIAGNOSIS — Z832 Family history of diseases of the blood and blood-forming organs and certain disorders involving the immune mechanism: Secondary | ICD-10-CM | POA: Diagnosis not present

## 2023-01-11 DIAGNOSIS — N200 Calculus of kidney: Secondary | ICD-10-CM | POA: Diagnosis not present

## 2023-01-11 LAB — CBC WITH DIFFERENTIAL/PLATELET
Abs Immature Granulocytes: 0.04 10*3/uL (ref 0.00–0.07)
Basophils Absolute: 0 10*3/uL (ref 0.0–0.1)
Basophils Relative: 0 %
Eosinophils Absolute: 0 10*3/uL (ref 0.0–0.5)
Eosinophils Relative: 0 %
HCT: 34.1 % — ABNORMAL LOW (ref 39.0–52.0)
Hemoglobin: 10.6 g/dL — ABNORMAL LOW (ref 13.0–17.0)
Immature Granulocytes: 1 %
Lymphocytes Relative: 9 %
Lymphs Abs: 0.8 10*3/uL (ref 0.7–4.0)
MCH: 24.3 pg — ABNORMAL LOW (ref 26.0–34.0)
MCHC: 31.1 g/dL (ref 30.0–36.0)
MCV: 78.2 fL — ABNORMAL LOW (ref 80.0–100.0)
Monocytes Absolute: 0.5 10*3/uL (ref 0.1–1.0)
Monocytes Relative: 6 %
Neutro Abs: 7.5 10*3/uL (ref 1.7–7.7)
Neutrophils Relative %: 84 %
Platelets: 367 10*3/uL (ref 150–400)
RBC: 4.36 MIL/uL (ref 4.22–5.81)
RDW: 15.3 % (ref 11.5–15.5)
WBC: 8.9 10*3/uL (ref 4.0–10.5)
nRBC: 0 % (ref 0.0–0.2)

## 2023-01-11 LAB — COMPREHENSIVE METABOLIC PANEL
ALT: 13 U/L (ref 0–44)
AST: 15 U/L (ref 15–41)
Albumin: 3 g/dL — ABNORMAL LOW (ref 3.5–5.0)
Alkaline Phosphatase: 71 U/L (ref 38–126)
Anion gap: 13 (ref 5–15)
BUN: 18 mg/dL (ref 6–20)
CO2: 18 mmol/L — ABNORMAL LOW (ref 22–32)
Calcium: 8.4 mg/dL — ABNORMAL LOW (ref 8.9–10.3)
Chloride: 104 mmol/L (ref 98–111)
Creatinine, Ser: 1.11 mg/dL (ref 0.61–1.24)
GFR, Estimated: >60 mL/min (ref >60)
Glucose, Bld: 235 mg/dL — ABNORMAL HIGH (ref 70–99)
Potassium: 3.4 mmol/L — ABNORMAL LOW (ref 3.5–5.1)
Sodium: 135 mmol/L (ref 135–145)
Total Bilirubin: 0.6 mg/dL (ref 0.3–1.2)
Total Protein: 6.8 g/dL (ref 6.5–8.1)

## 2023-01-11 LAB — URINALYSIS, ROUTINE W REFLEX MICROSCOPIC
Bilirubin Urine: NEGATIVE
Glucose, UA: >=500 mg/dL — AB
Ketones, ur: NEGATIVE mg/dL
Nitrite: NEGATIVE
Protein, ur: 30 mg/dL — AB
Specific Gravity, Urine: 1.035 — ABNORMAL HIGH (ref 1.005–1.030)
pH: 5 (ref 5.0–8.0)

## 2023-01-11 LAB — LACTIC ACID, PLASMA: Lactic Acid, Venous: 1.7 mmol/L (ref 0.5–1.9)

## 2023-01-11 LAB — PROTIME-INR
INR: 1.1 (ref 0.8–1.2)
Prothrombin Time: 14.5 seconds (ref 11.4–15.2)

## 2023-01-11 LAB — APTT: aPTT: 37 seconds — ABNORMAL HIGH (ref 24–36)

## 2023-01-11 MED ORDER — METHYLPREDNISOLONE SODIUM SUCC 40 MG IJ SOLR
40.0000 mg | Freq: Once | INTRAMUSCULAR | Status: AC
  Administered 2023-01-11: 40 mg via INTRAVENOUS
  Filled 2023-01-11: qty 1

## 2023-01-11 MED ORDER — PIPERACILLIN-TAZOBACTAM 3.375 G IVPB 30 MIN
3.3750 g | Freq: Once | INTRAVENOUS | Status: AC
  Administered 2023-01-11: 3.375 g via INTRAVENOUS
  Filled 2023-01-11 (×2): qty 50

## 2023-01-11 MED ORDER — SODIUM CHLORIDE (PF) 0.9 % IJ SOLN
INTRAMUSCULAR | Status: AC
  Filled 2023-01-11: qty 100

## 2023-01-11 MED ORDER — IOHEXOL 9 MG/ML PO SOLN
ORAL | Status: AC
  Filled 2023-01-11: qty 1000

## 2023-01-11 MED ORDER — DIPHENHYDRAMINE HCL 25 MG PO CAPS: 50.0000 mg | ORAL_CAPSULE | Freq: Once | ORAL | Status: AC

## 2023-01-11 MED ORDER — DIPHENHYDRAMINE HCL 50 MG/ML IJ SOLN
50.0000 mg | Freq: Once | INTRAMUSCULAR | Status: AC
  Administered 2023-01-12: 50 mg via INTRAVENOUS
  Filled 2023-01-11: qty 1

## 2023-01-11 NOTE — ED Provider Notes (Signed)
[facility] Provider Note   CSN: [id number] Arrival date & time: 01/11/23  1950     History  Chief Complaint  Patient presents with   Abdominal Pain    Steven [name] is a 52 y.o. male who presents emergency department with chief complaint of abdominal pain and fever.  He has a's past medical history of adenocarcinoma of the rectum.  He has also chronic diabetes mellitus.  Patient underwent an ileostomy takedown with colostomy creation on 12/31/2022 with Dr. Romie Levee.  He has a JP drain in the right lower part of his abdomen.  Patient states that he was told to expect some cramping pain in his abdomen which she has had however today the pain became fairly severe.  He had onset of hot and cold chills and took his temperature and is 102 F at home.  He called on-call physician and was told to come to the emergency department.  Patient states that over the past 2 days he has not seen much output from his JP drain however upon coming into the emergency department had diffuse discharge from around the JP drain and soaked his underwear.  He is also noticed some fluid coming from his rectum and anus.  He states he had that it first after the surgery and it had resolved but returned over the last day or 2.    Abdominal Pain      Home Medications Prior to Admission medications   Medication Sig Start Date End Date Taking? Authorizing Provider  acetaminophen (TYLENOL) 500 MG tablet Take 1,000 mg by mouth every 6 (six) hours as needed for mild pain.    [provider]  albuterol (PROAIR HFA) 108 (90 Base) MCG/ACT inhaler Inhale 2 puffs into the lungs every 4 (four) hours as needed for wheezing or shortness of breath. 08/30/10   [provider]  ALPRAZolam Prudy Feeler) 0.5 MG tablet Take 0.5 mg by mouth daily as needed for anxiety.    [provider]  blood glucose meter kit and supplies KIT Dispense based on patient and insurance  preference. Use up to four times daily as directed. (FOR ICD-9 250.00, 250.01). 07/08/20   Azucena Fallen, MD  calcium carbonate (TUMS - DOSED IN MG ELEMENTAL CALCIUM) 500 MG chewable tablet Chew 2 tablets by mouth daily as needed for indigestion or heartburn.    [provider]  dicyclomine (BENTYL) 10 MG capsule Take 1 capsule (10 mg total) by mouth 3 (three) times daily as needed for spasms. Take before meals 01/03/23   Romie Levee, MD  fenofibrate 160 MG tablet Take 160 mg by mouth daily. 02/23/19   [provider]  gabapentin (NEURONTIN) 300 MG capsule TAKE 1 CAPSULE(300 MG) BY MOUTH AT BEDTIME Patient not taking: Reported on 12/15/2022 11/04/22   Rachel Moulds, MD  ibuprofen (ADVIL) 200 MG tablet Take 600 mg by mouth every 6 (six) hours as needed for moderate pain.    [provider]  neomycin-bacitracin-polymyxin (NEOSPORIN) OINT Apply 1 Application topically as needed for wound care.    [provider]  tetrahydrozoline-zinc (VISINE-AC) 0.05-0.25 % ophthalmic solution Place 2 drops into both eyes 4 (four) times daily as needed (allergy).    [provider]  traMADol (ULTRAM) 50 MG tablet Take 1-2 tablets (50-100 mg total) by mouth every 6 (six) hours as needed for moderate pain. 01/03/23   Romie Levee, MD  XIGDUO XR 01-999 MG TB24 Take 1 tablet by mouth  2 (two) times daily with a meal. 06/13/20   [provider]      Allergies    Contrast media [iodinated contrast media]    Review of Systems   Review of Systems  Gastrointestinal:  Positive for abdominal pain.    Physical Exam Updated Vital Signs BP 122/72   Pulse 99   Temp 100 F (37.8 C) (Oral)   Resp 15   SpO2 96%  Physical Exam Vitals and nursing note reviewed.  Constitutional:      General: He is not in acute distress.    Appearance: He is well-developed. He is not diaphoretic.  HENT:     Head: Normocephalic and atraumatic.  Eyes:     General: No scleral  icterus.    Conjunctiva/sclera: Conjunctivae normal.  Cardiovascular:     Rate and Rhythm: Normal rate and regular rhythm.     Heart sounds: Normal heart sounds.  Pulmonary:     Effort: Pulmonary effort is normal. No respiratory distress.     Breath sounds: Normal breath sounds.  Abdominal:     Palpations: Abdomen is soft.     Tenderness: There is no abdominal tenderness.       Comments: Multiple well-healing surgical scars on the abdomen.  Tenderness around the abdomen to palpation more so in the lower abdomen.  Genitourinary:    Comments: Patient has stage I-II and places pressure ulcer over the sacral region. No obvious leakage from the rectum or anus however there is some clear fluid around the anus. Musculoskeletal:     Cervical back: Normal range of motion and neck supple.  Skin:    General: Skin is warm and dry.  Neurological:     Mental Status: He is alert.  Psychiatric:        Behavior: Behavior normal.     ED Results / Procedures / Treatments   Labs (all labs ordered are listed, but only abnormal results are displayed) Labs Reviewed  COMPREHENSIVE METABOLIC PANEL - Abnormal; Notable for the following components:      Result Value   Potassium 3.4 (*)    CO2 18 (*)    Glucose, Bld 235 (*)    Calcium 8.4 (*)    Albumin 3.0 (*)    All other components within normal limits  CBC WITH DIFFERENTIAL/PLATELET - Abnormal; Notable for the following components:   Hemoglobin 10.6 (*)    HCT 34.1 (*)    MCV 78.2 (*)    MCH 24.3 (*)    All other components within normal limits  APTT - Abnormal; Notable for the following components:   aPTT 37 (*)    All other components within normal limits  CULTURE, BLOOD (ROUTINE X 2)  CULTURE, BLOOD (ROUTINE X 2)  LACTIC ACID, PLASMA  PROTIME-INR  LACTIC ACID, PLASMA  URINALYSIS, ROUTINE W REFLEX MICROSCOPIC    EKG EKG Interpretation  Date/Time:  Sunday January 11 2023 20:45:30 EDT Ventricular Rate:  103 PR Interval:  181 QRS  Duration: 80 QT Interval:  338 QTC Calculation: 443 R Axis:   -7 Text Interpretation: Sinus tachycardia Low voltage, precordial leads Anteroseptal infarct, old No significant change since last tracing Confirmed by Richardean Canal [id number]) on 01/11/2023 11:06:05 PM  Radiology No results found.  Procedures Procedures    Medications Ordered in ED Medications  methylPREDNISolone sodium succinate (SOLU-MEDROL) 40 mg/mL injection 40 mg (has no administration in time range)  diphenhydrAMINE (BENADRYL) capsule 50 mg (has no administration in time range)  Or  diphenhydrAMINE (BENADRYL) injection 50 mg (has no administration in time range)  piperacillin-tazobactam (ZOSYN) IVPB 3.375 g (0 g Intravenous Stopped 01/11/23 2249)    ED Course/ Medical Decision Making/ A&P Clinical Course as of 01/11/23 2309  Sun Jan 11, 2023  2302 CT ABDOMEN PELVIS WO CONTRAST [AH]    Clinical Course User Index [AH] Arthor Captain, PA-C                             Medical Decision Making 52 year old male who presents emergency department with postop fever and increased abdominal pain with discharge around the Jackson-Pratt drain. I ordered labs including lactic acid which is within normal limits, CMP shows elevated blood glucose of insignificant value, CBC without elevated white blood cell count, hemoglobin stable.    I discussed findings of noncontrast CT and individually interpreted CT findings.   CT is somewhat difficult to read due to lack of contrast however appears to be likely burrowing and fraction in the abdomen.  Case discussed with Dr. Tessie Fass telephone.  Patient will need contrast study.  I have ordered premedication regimen.  Patient receiving fluids, Zosyn.  Case discussed with Dr. Corliss Skains who will admit the patient.   Amount and/or Complexity of Data Reviewed Labs: ordered. Radiology: ordered. Decision-making details documented in ED Course. ECG/medicine tests: ordered.  Risk Prescription  drug management.           Final Clinical Impression(s) / ED Diagnoses Final diagnoses:  None    Rx / DC Orders ED Discharge Orders     None         Arthor Captain, PA-C 01/11/23 2352    [name], MD 01/15/23 1550

## 2023-01-11 NOTE — ED Triage Notes (Signed)
Pt states that he has had increased crampy abdominal pain, has taken bentyl for pain. Started with fever today, new drainage around JP abdominal drain. Pt had a hernia repair/ostomy revision on 4/3.

## 2023-01-11 NOTE — Progress Notes (Signed)
A consult was received from an ED provider for Zosyn per pharmacy dosing.  The patient's profile has been reviewed for ht/wt/allergies/indication/available labs.    A one time order has been placed for Zosyn 3.375g IV over 30 minutes.  Further antibiotics/pharmacy consults should be ordered by admitting physician if indicated.                       Thank you, Jamse Mead 01/11/2023  9:16 PM

## 2023-01-12 ENCOUNTER — Inpatient Hospital Stay (HOSPITAL_COMMUNITY): Payer: BC Managed Care – PPO

## 2023-01-12 LAB — GLUCOSE, CAPILLARY
Glucose-Capillary: 256 mg/dL — ABNORMAL HIGH (ref 70–99)
Glucose-Capillary: 198 mg/dL — ABNORMAL HIGH (ref 70–99)
Glucose-Capillary: 176 mg/dL — ABNORMAL HIGH (ref 70–99)
Glucose-Capillary: 146 mg/dL — ABNORMAL HIGH (ref 70–99)
Glucose-Capillary: 168 mg/dL — ABNORMAL HIGH (ref 70–99)

## 2023-01-12 LAB — CBC
HCT: 31.3 % — ABNORMAL LOW (ref 39.0–52.0)
Hemoglobin: 9.7 g/dL — ABNORMAL LOW (ref 13.0–17.0)
MCH: 24.3 pg — ABNORMAL LOW (ref 26.0–34.0)
MCHC: 31 g/dL (ref 30.0–36.0)
MCV: 78.3 fL — ABNORMAL LOW (ref 80.0–100.0)
Platelets: 357 10*3/uL (ref 150–400)
RBC: 4 MIL/uL — ABNORMAL LOW (ref 4.22–5.81)
RDW: 15.3 % (ref 11.5–15.5)
WBC: 5.9 10*3/uL (ref 4.0–10.5)
nRBC: 0 % (ref 0.0–0.2)

## 2023-01-12 LAB — BASIC METABOLIC PANEL
Anion gap: 13 (ref 5–15)
BUN: 22 mg/dL — ABNORMAL HIGH (ref 6–20)
CO2: 20 mmol/L — ABNORMAL LOW (ref 22–32)
Calcium: 8.3 mg/dL — ABNORMAL LOW (ref 8.9–10.3)
Chloride: 98 mmol/L (ref 98–111)
Creatinine, Ser: 1.12 mg/dL (ref 0.61–1.24)
GFR, Estimated: >60 mL/min (ref >60)
Glucose, Bld: 220 mg/dL — ABNORMAL HIGH (ref 70–99)
Potassium: 4 mmol/L (ref 3.5–5.1)
Sodium: 131 mmol/L — ABNORMAL LOW (ref 135–145)

## 2023-01-12 LAB — HIV ANTIBODY (ROUTINE TESTING W REFLEX): HIV Screen 4th Generation wRfx: NONREACTIVE

## 2023-01-12 LAB — CULTURE, BLOOD (ROUTINE X 2)

## 2023-01-12 MED ORDER — DICYCLOMINE HCL 10 MG PO CAPS: 10.0000 mg | ORAL_CAPSULE | Freq: Three times a day (TID) | ORAL | Status: DC | PRN

## 2023-01-12 MED ORDER — ENOXAPARIN SODIUM 40 MG/0.4ML IJ SOSY
40.0000 mg | PREFILLED_SYRINGE | INTRAMUSCULAR | Status: DC
  Filled 2023-01-12 (×2): qty 0.4

## 2023-01-12 MED ORDER — SODIUM CHLORIDE 0.9 % IV SOLN: INTRAVENOUS | Status: DC

## 2023-01-12 MED ORDER — ALUM & MAG HYDROXIDE-SIMETH 200-200-20 MG/5ML PO SUSP
30.0000 mL | ORAL | Status: AC | PRN
  Administered 2023-01-12: 30 mL via ORAL
  Filled 2023-01-12: qty 30

## 2023-01-12 MED ORDER — IOHEXOL 300 MG/ML  SOLN
100.0000 mL | Freq: Once | INTRAMUSCULAR | Status: AC | PRN
  Administered 2023-01-12: 100 mL via INTRAVENOUS

## 2023-01-12 MED ORDER — ACETAMINOPHEN 650 MG RE SUPP: 650.0000 mg | Freq: Four times a day (QID) | RECTAL | Status: DC | PRN

## 2023-01-12 MED ORDER — ADULT MULTIVITAMIN W/MINERALS CH
1.0000 | ORAL_TABLET | Freq: Every day | ORAL | Status: DC
  Administered 2023-01-12 – 2023-01-13 (×2): 1 via ORAL
  Filled 2023-01-12 (×2): qty 1

## 2023-01-12 MED ORDER — DAPAGLIFLOZIN PROPANEDIOL 5 MG PO TABS
5.0000 mg | ORAL_TABLET | Freq: Two times a day (BID) | ORAL | Status: DC
  Administered 2023-01-12 – 2023-01-13 (×3): 5 mg via ORAL
  Filled 2023-01-12 (×3): qty 1

## 2023-01-12 MED ORDER — GLUCERNA SHAKE PO LIQD
237.0000 mL | Freq: Three times a day (TID) | ORAL | Status: DC
  Administered 2023-01-12 – 2023-01-13 (×2): 237 mL via ORAL
  Filled 2023-01-12 (×4): qty 237

## 2023-01-12 MED ORDER — METFORMIN HCL ER 500 MG PO TB24
1000.0000 mg | ORAL_TABLET | Freq: Two times a day (BID) | ORAL | Status: DC
  Administered 2023-01-12 – 2023-01-13 (×3): 1000 mg via ORAL
  Filled 2023-01-12 (×3): qty 2

## 2023-01-12 MED ORDER — ONDANSETRON 4 MG PO TBDP: 4.0000 mg | ORAL_TABLET | Freq: Four times a day (QID) | ORAL | Status: DC | PRN

## 2023-01-12 MED ORDER — MORPHINE SULFATE (PF) 4 MG/ML IV SOLN: 4.0000 mg | INTRAVENOUS | Status: DC | PRN

## 2023-01-12 MED ORDER — ONDANSETRON HCL 4 MG/2ML IJ SOLN: 4.0000 mg | Freq: Four times a day (QID) | INTRAMUSCULAR | Status: DC | PRN

## 2023-01-12 MED ORDER — DAPAGLIFLOZIN PRO-METFORMIN ER 5-1000 MG PO TB24: 1.0000 | ORAL_TABLET | Freq: Two times a day (BID) | ORAL | Status: DC

## 2023-01-12 MED ORDER — INSULIN ASPART 100 UNIT/ML IJ SOLN
0.0000 [IU] | INTRAMUSCULAR | Status: DC
  Administered 2023-01-12 (×3): 2 [IU] via SUBCUTANEOUS
  Administered 2023-01-12: 5 [IU] via SUBCUTANEOUS
  Administered 2023-01-12: 1 [IU] via SUBCUTANEOUS
  Administered 2023-01-13 (×2): 2 [IU] via SUBCUTANEOUS
  Administered 2023-01-13: 1 [IU] via SUBCUTANEOUS
  Filled 2023-01-12: qty 0.09

## 2023-01-12 MED ORDER — ACETAMINOPHEN 325 MG PO TABS
650.0000 mg | ORAL_TABLET | Freq: Four times a day (QID) | ORAL | Status: DC | PRN
  Administered 2023-01-12: 650 mg via ORAL
  Filled 2023-01-12: qty 2

## 2023-01-12 MED ORDER — ALBUTEROL SULFATE HFA 108 (90 BASE) MCG/ACT IN AERS: 2.0000 | INHALATION_SPRAY | RESPIRATORY_TRACT | Status: DC | PRN

## 2023-01-12 MED ORDER — DIPHENHYDRAMINE HCL 25 MG PO CAPS: 25.0000 mg | ORAL_CAPSULE | Freq: Four times a day (QID) | ORAL | Status: DC | PRN

## 2023-01-12 MED ORDER — DIPHENHYDRAMINE HCL 50 MG/ML IJ SOLN: 25.0000 mg | Freq: Four times a day (QID) | INTRAMUSCULAR | Status: DC | PRN

## 2023-01-12 MED ORDER — ALBUTEROL SULFATE (2.5 MG/3ML) 0.083% IN NEBU: 2.5000 mg | INHALATION_SOLUTION | RESPIRATORY_TRACT | Status: DC | PRN

## 2023-01-12 NOTE — Progress Notes (Signed)
Initial Nutrition Assessment  DOCUMENTATION CODES:   Not applicable  INTERVENTION:  Discussed adequate nutrition  Glucerna Shake po TID, each supplement provides 220 kcal and 10 grams of protein MVI  Secure messaged MD about hyperglycemia   NUTRITION DIAGNOSIS:   Increased nutrient needs related to cancer and cancer related treatments, post-op healing as evidenced by estimated needs.   GOAL:   Patient will meet greater than or equal to 90% of their needs   MONITOR:   Supplement acceptance, PO intake, I & O's, Weight trends, Labs, Skin  REASON FOR ASSESSMENT:   Malnutrition Screening Tool    ASSESSMENT:    52 y.o. male with PMHx including T2DM, HLD, HTN, PVD, anxiety, OSA, and asthma presents with rectal cancer s/p robotic LAR/diverting loop ileostomy complicated by anastomotic leak s/p recent ileostomy takedown, creation of permanent descending colostomy who presents with fever up to 102, worsening abdominal cramping, increased JP drainage, and fluid from anal incision. He is being admitted for premedication and contrast CT scan Recently discharged 01/03/23 for pelvic drain   CT scans unremarkable-- mild wall thickening in colon   Labs:  Na 131, Glu 220, BUN 22, hga1c 9 (12/18/22) Meds: insulin, metformin, NS @100  ml/hr  Wt: 6.4 kg (7%) wt loss x 1 week  PO: no meal intakes documented at this time; patient currently on CL diet  I/O's:  +724 ml   Visited patient at bedside who reports his appetite is returning as he was chewing on some Ritz crackers. Patient's diet was upgraded to Carb modified. He had not ordered any food yet. His wife at bedside reports that he has not been eating much the past couple of days. Patient reports he eats cereal and cup of coffee for breakfast, a complete meal for lunch usually, or a protein shake, and complete meal for dinner along with 1-2 Premier Protein shakes per day   Patient reports good ostomy output. He denies N/V/D/C, trouble  chewing/swallowing.  Patient expressed concern for frequent hyperglycemia. He seems compliant with his diet and reports taking his meds and does not understand why his BG's are normally in the 200s. He reports checking his BG sometimes 7x a day. He understood that stress can exacerbate hyperglycemia and wonders if he needs a stronger dose of medication in order to avoid taking insulin shots.   UBW reported to fluctuate between 190-200#.   NUTRITION - FOCUSED PHYSICAL EXAM:  Flowsheet Row Most Recent Value  Orbital Region No depletion  Upper Arm Region Moderate depletion  Thoracic and Lumbar Region No depletion  Buccal Region No depletion  Temple Region No depletion  Clavicle Bone Region Mild depletion  Clavicle and Acromion Bone Region Moderate depletion  Scapular Bone Region Unable to assess  Dorsal Hand No depletion  Patellar Region No depletion  Anterior Thigh Region No depletion  Posterior Calf Region No depletion  Edema (RD Assessment) None  Hair Reviewed  Eyes Reviewed  Mouth Reviewed  Skin Reviewed  Nails Reviewed       Diet Order:   Diet Order             Diet Carb Modified Fluid consistency: Thin; Room service appropriate? Yes  Diet effective now                   EDUCATION NEEDS:   Education needs have been addressed  Skin:  Skin Assessment: Reviewed RN Assessment  Last BM:  4/15 via ostomy  Height:   Ht Readings from Last 1  Encounters:  01/12/23  (1.778 m)    Weight:   Wt Readings from Last 1 Encounters:  01/12/23 86.1 kg    BMI:  Body mass index is 27.24 kg/m.  Estimated Nutritional Needs:   Kcal:  2000-2500  Protein:  100-130  Fluid:  >/= 2L    Leodis Rains, RDN, LDN  Clinical Nutrition

## 2023-01-12 NOTE — Progress Notes (Signed)
Rectal cancer  Subjective: Feels better.  No further fevers, tolerating clears  Objective: Vital signs in last 24 hours: Temp:  [97.8 F (36.6 C)-101.3 F (38.5 C)] 97.8 F (36.6 C) (04/15 0858) Pulse Rate:  [83-118] 88 (04/15 0858) Resp:  [14-22] 16 (04/15 0858) BP: (114-130)/(72-82) 127/79 (04/15 0858) SpO2:  [96 %-100 %] 100 % (04/15 0858) Weight:  [86.1 kg] 86.1 kg (04/15 0145) Last BM Date : 01/12/23  Intake/Output from previous day: 04/14 0701 - 04/15 0700 In: 50 [IV Piggyback:50] Out: 0  Intake/Output this shift: Total I/O In: 674 [P.O.:674] Out: -   General appearance: alert and cooperative GI: soft, non-distended JP: Dark colored output c/w old hematoma , drain stripped  Lab Results:  Results for orders placed or performed during the hospital encounter of 01/11/23 (from the past 24 hour(s))  Lactic acid, plasma     Status: None   Collection Time: 01/11/23  8:24 PM  Result Value Ref Range   Lactic Acid, Venous 1.7 0.5 - 1.9 mmol/L  Comprehensive metabolic panel     Status: Abnormal   Collection Time: 01/11/23  8:24 PM  Result Value Ref Range   Sodium 135 135 - 145 mmol/L   Potassium 3.4 (L) 3.5 - 5.1 mmol/L   Chloride 104 98 - 111 mmol/L   CO2 18 (L) 22 - 32 mmol/L   Glucose, Bld 235 (H) 70 - 99 mg/dL   BUN 18 6 - 20 mg/dL   Creatinine, Ser 1.61 0.61 - 1.24 mg/dL   Calcium 8.4 (L) 8.9 - 10.3 mg/dL   Total Protein 6.8 6.5 - 8.1 g/dL   Albumin 3.0 (L) 3.5 - 5.0 g/dL   AST 15 15 - 41 U/L   ALT 13 0 - 44 U/L   Alkaline Phosphatase 71 38 - 126 U/L   Total Bilirubin 0.6 0.3 - 1.2 mg/dL   GFR, Estimated >09 >60 mL/min   Anion gap 13 5 - 15  CBC with Differential     Status: Abnormal   Collection Time: 01/11/23  8:24 PM  Result Value Ref Range   WBC 8.9 4.0 - 10.5 K/uL   RBC 4.36 4.22 - 5.81 MIL/uL   Hemoglobin 10.6 (L) 13.0 - 17.0 g/dL   HCT 45.4 (L) 09.8 - 11.9 %   MCV 78.2 (L) 80.0 - 100.0 fL   MCH 24.3 (L) 26.0 - 34.0 pg   MCHC 31.1 30.0 - 36.0  g/dL   RDW 14.7 82.9 - 56.2 %   Platelets 367 150 - 400 K/uL   nRBC 0.0 0.0 - 0.2 %   Neutrophils Relative % 84 %   Neutro Abs 7.5 1.7 - 7.7 K/uL   Lymphocytes Relative 9 %   Lymphs Abs 0.8 0.7 - 4.0 K/uL   Monocytes Relative 6 %   Monocytes Absolute 0.5 0.1 - 1.0 K/uL   Eosinophils Relative 0 %   Eosinophils Absolute 0.0 0.0 - 0.5 K/uL   Basophils Relative 0 %   Basophils Absolute 0.0 0.0 - 0.1 K/uL   Immature Granulocytes 1 %   Abs Immature Granulocytes 0.04 0.00 - 0.07 K/uL  Protime-INR     Status: None   Collection Time: 01/11/23  8:24 PM  Result Value Ref Range   Prothrombin Time 14.5 11.4 - 15.2 seconds   INR 1.1 0.8 - 1.2  APTT     Status: Abnormal   Collection Time: 01/11/23  8:24 PM  Result Value Ref Range   aPTT 37 (H) 24 -  36 seconds  Blood Culture (routine x 2)     Status: None (Preliminary result)   Collection Time: 01/11/23  8:24 PM   Specimen: BLOOD RIGHT FOREARM  Result Value Ref Range   Specimen Description BLOOD RIGHT FOREARM    Special Requests      BOTTLES DRAWN AEROBIC AND ANAEROBIC Blood Culture results may not be optimal due to an inadequate volume of blood received in culture bottles Performed at [facility], [address], [place], [place] [address]    Culture PENDING    Report Status PENDING   Blood Culture (routine x 2)     Status: None (Preliminary result)   Collection Time: 01/11/23  8:47 PM   Specimen: BLOOD  Result Value Ref Range   Specimen Description BLOOD SITE NOT SPECIFIED    Special Requests      BOTTLES DRAWN AEROBIC AND ANAEROBIC Blood Culture adequate volume Performed at [facility], [address] [place], [place] [address]    Culture PENDING    Report Status PENDING   Urinalysis, Routine w reflex microscopic -Urine, Clean Catch     Status: Abnormal   Collection Time: 01/11/23 11:11 PM  Result Value Ref Range   Color, Urine YELLOW YELLOW   APPearance HAZY (A) CLEAR   Specific Gravity, Urine 1.035 (H) 1.005 -  1.030   pH 5.0 5.0 - 8.0   Glucose, UA >=500 (A) NEGATIVE mg/dL   Hgb urine dipstick SMALL (A) NEGATIVE   Bilirubin Urine NEGATIVE NEGATIVE   Ketones, ur NEGATIVE NEGATIVE mg/dL   Protein, ur 30 (A) NEGATIVE mg/dL   Nitrite NEGATIVE NEGATIVE   Leukocytes,Ua TRACE (A) NEGATIVE   RBC / HPF 6-10 0 - 5 RBC/hpf   WBC, UA 21-50 0 - 5 WBC/hpf   Bacteria, UA RARE (A) NONE SEEN   Squamous Epithelial / HPF 0-5 0 - 5 /HPF   WBC Clumps PRESENT    Mucus PRESENT    Sperm, UA PRESENT   Glucose, capillary     Status: Abnormal   Collection Time: 01/12/23  4:35 AM  Result Value Ref Range   Glucose-Capillary 256 (H) 70 - 99 mg/dL  HIV Antibody (routine testing w rflx)     Status: None   Collection Time: 01/12/23  6:08 AM  Result Value Ref Range   HIV Screen 4th Generation wRfx Non Reactive Non Reactive  Basic metabolic panel     Status: Abnormal   Collection Time: 01/12/23  6:08 AM  Result Value Ref Range   Sodium 131 (L) 135 - 145 mmol/L   Potassium 4.0 3.5 - 5.1 mmol/L   Chloride 98 98 - 111 mmol/L   CO2 20 (L) 22 - 32 mmol/L   Glucose, Bld 220 (H) 70 - 99 mg/dL   BUN 22 (H) 6 - 20 mg/dL   Creatinine, Ser 9.41 0.61 - 1.24 mg/dL   Calcium 8.3 (L) 8.9 - 10.3 mg/dL   GFR, Estimated >74 >08 mL/min   Anion gap 13 5 - 15  CBC     Status: Abnormal   Collection Time: 01/12/23  6:08 AM  Result Value Ref Range   WBC 5.9 4.0 - 10.5 K/uL   RBC 4.00 (L) 4.22 - 5.81 MIL/uL   Hemoglobin 9.7 (L) 13.0 - 17.0 g/dL   HCT 14.4 (L) 81.8 - 56.3 %   MCV 78.3 (L) 80.0 - 100.0 fL   MCH 24.3 (L) 26.0 - 34.0 pg   MCHC 31.0 30.0 -  36.0 g/dL   RDW 86.5 78.4 - 69.6 %   Platelets 357 150 - 400 K/uL   nRBC 0.0 0.0 - 0.2 %  Glucose, capillary     Status: Abnormal   Collection Time: 01/12/23  7:37 AM  Result Value Ref Range   Glucose-Capillary 198 (H) 70 - 99 mg/dL  Glucose, capillary     Status: Abnormal   Collection Time: 01/12/23 12:19 PM  Result Value Ref Range   Glucose-Capillary 176 (H) 70 - 99 mg/dL      Studies/Results Radiology     MEDS, Scheduled  dapagliflozin propanediol  5 mg Oral BID WC   And   metFORMIN  1,000 mg Oral BID WC   enoxaparin (LOVENOX) injection  40 mg Subcutaneous Q24H   insulin aspart  0-9 Units Subcutaneous Q4H     Assessment: Rectal cancer Abd fluid collection: appears to be old hematoma.  No signs of infection currently on exam.    Plan: Advance to reg diet Monitor for fevers.  I believe this is resolving hematoma and is slowly being evacuated by his drain.  Given no further fevers, no purulent JP output and normal WBC, I do not feel that he has an abscess.  Pt not getting abx currently and just received one dose in ED prior to all information being obtained.  I do not feel strongly that he needs to continue this unless he develops more fevers.     LOS: 1 day    Vanita Panda, MD [facility], [place]     01/12/2023 1:30 PM

## 2023-01-12 NOTE — H&P (Signed)
Steven [name] is an 52 y.o. male.   Chief Complaint: Fever, increased abdominal pain, increased drainage HPI: This is a 52 year old male with rectal cancer s/p robotic LAR/ diverting loop ileostomy complicated by anastomotic leak s/p recent ileostomy takedown and creation of permanent descending colostomy.  He was discharged 01/03/23 with a drain in place in the pelvis.  Reportedly, he had been doing well with decreased drain output, but earlier on 01/11/23, he began running fever up to 102 with worsening cramping abdominal pain.  He has noticed increased drainage coming around the JP drain.  He has also noticed some fluid coming from the anal closure site.  He presented to the ED for evaluation.  He was febrile and tachycardic.  WBC is normal.  Initial CT scan was without contrast due to contrast allergy and was equivocal for any pelvic abscess.  He is being admitted for premedication and contrast CT scan.    Past Medical History:  Diagnosis Date   Anemia 02/2021   iron   Anxiety    Asthma    Cancer 02/2021   Depression    Diabetes mellitus without complication    History of kidney stones    HLD (hyperlipidemia)    Hypertension    Kidney stones    Neuromuscular disorder    from radiation bi lat   OSA (obstructive sleep apnea)    CPAP   Peripheral vascular disease    DVT   Sleep apnea     Past Surgical History:  Procedure Laterality Date   ABDOMINAL SURGERY     APPENDECTOMY  1989   ag   COLONOSCOPY  03/05/2021   CYST EXCISION  2011   left neck   DIVERTING ILEOSTOMY N/A 07/17/2021   Procedure: DIVERTING ILEOSTOMY;  Surgeon: Romie Levee, MD;  Location: [facility];  Service: General;  Laterality: N/A;   FLEXIBLE SIGMOIDOSCOPY N/A 04/04/2022   Procedure: FLEXIBLE SIGMOIDOSCOPY WITH POSSIBLE DEBRIDEMENT;  Surgeon: Romie Levee, MD;  Location: [facility];  Service: General;  Laterality: N/A;   RECTAL EXAM UNDER ANESTHESIA N/A 04/04/2022   Procedure: ANAL EXAM UNDER  ANESTHESIA;  Surgeon: Romie Levee, MD;  Location: [facility];  Service: General;  Laterality: N/A;   WISDOM TOOTH EXTRACTION     age 11   XI ROBOTIC ASSISTED LOWER ANTERIOR RESECTION N/A 07/17/2021   Procedure: XI ROBOTIC ASSISTED LOWER ANTERIOR RESECTION;  Surgeon: Romie Levee, MD;  Location: [facility];  Service: General;  Laterality: N/A;   XI ROBOTIC ASSISTED LOWER ANTERIOR RESECTION N/A 12/31/2022   Procedure: ROBOTIC ASSISTED LOWER ANTERIOR RESECTION WITH OSTOMY with intraop ICG guided perfusion, takedown of iliostomy;  Surgeon: Romie Levee, MD;  Location: [facility];  Service: General;  Laterality: N/A;    Family History  Problem Relation Age of Onset   Diabetes Mellitus II Mother    Breast cancer Mother 27   Thrombophlebitis Father    Breast cancer Maternal Grandmother 92   Diabetes Maternal Grandmother    Breast cancer Maternal Aunt 60   Diabetes Maternal Aunt    Hypertension Paternal Grandmother    Colon cancer Neg Hx    Esophageal cancer Neg Hx    Inflammatory bowel disease Neg Hx    Liver disease Neg Hx    Pancreatic cancer Neg Hx    Rectal cancer Neg Hx    Stomach cancer Neg Hx    Social History:  reports that he has never smoked. He has never used smokeless tobacco. He  reports current alcohol use. He reports that he does not use drugs.  Allergies:  Allergies  Allergen Reactions   Contrast Media [Iodinated Contrast Media] Hives and Nausea Only    As a 52 year old experienced hives and nausea    Facility-Administered Medications Prior to Admission  Medication Dose Route Frequency Provider Last Rate Last Admin   0.9 %  sodium chloride infusion  500 mL Intravenous Continuous Mansouraty, Netty Starring., MD       Medications Prior to Admission  Medication Sig Dispense Refill   acetaminophen (TYLENOL) 500 MG tablet Take 1,000 mg by mouth every 6 (six) hours as needed for mild pain.     albuterol (PROAIR HFA) 108 (90 Base) MCG/ACT inhaler Inhale 2 puffs  into the lungs every 4 (four) hours as needed for wheezing or shortness of breath.     ALPRAZolam (XANAX) 0.5 MG tablet Take 0.5 mg by mouth daily as needed for anxiety.     calcium carbonate (TUMS - DOSED IN MG ELEMENTAL CALCIUM) 500 MG chewable tablet Chew 2 tablets by mouth daily as needed for indigestion or heartburn.     dicyclomine (BENTYL) 10 MG capsule Take 1 capsule (10 mg total) by mouth 3 (three) times daily as needed for spasms. Take before meals 40 capsule 0   fenofibrate 160 MG tablet Take 160 mg by mouth daily.     ibuprofen (ADVIL) 200 MG tablet Take 600 mg by mouth every 6 (six) hours as needed for moderate pain.     neomycin-bacitracin-polymyxin (NEOSPORIN) OINT Apply 1 Application topically as needed for wound care.     tetrahydrozoline-zinc (VISINE-AC) 0.05-0.25 % ophthalmic solution Place 2 drops into both eyes 4 (four) times daily as needed (allergy).     traMADol (ULTRAM) 50 MG tablet Take 1-2 tablets (50-100 mg total) by mouth every 6 (six) hours as needed for moderate pain. 20 tablet 0   XIGDUO XR 01-999 MG TB24 Take 1 tablet by mouth 2 (two) times daily with a meal.     blood glucose meter kit and supplies KIT Dispense based on patient and insurance preference. Use up to four times daily as directed. (FOR ICD-9 250.00, 250.01). 1 each 0   gabapentin (NEURONTIN) 300 MG capsule TAKE 1 CAPSULE(300 MG) BY MOUTH AT BEDTIME (Patient not taking: Reported on 12/15/2022) 30 capsule 3    Results for orders placed or performed during the hospital encounter of 01/11/23 (from the past 48 hour(s))  Lactic acid, plasma     Status: None   Collection Time: 01/11/23  8:24 PM  Result Value Ref Range   Lactic Acid, Venous 1.7 0.5 - 1.9 mmol/L    Comment: Performed at [facility], [address] [place], [place] [address]  Comprehensive metabolic panel     Status: Abnormal   Collection Time: 01/11/23  8:24 PM  Result Value Ref Range   Sodium 135 135 - 145 mmol/L    Potassium 3.4 (L) 3.5 - 5.1 mmol/L   Chloride 104 98 - 111 mmol/L   CO2 18 (L) 22 - 32 mmol/L   Glucose, Bld 235 (H) 70 - 99 mg/dL    Comment: Glucose reference range applies only to samples taken after fasting for at least 8 hours.   BUN 18 6 - 20 mg/dL   Creatinine, Ser 6.04 0.61 - 1.24 mg/dL   Calcium 8.4 (L) 8.9 - 10.3 mg/dL   Total Protein 6.8 6.5 - 8.1 g/dL   Albumin 3.0 (L) 3.5 -  5.0 g/dL   AST 15 15 - 41 U/L   ALT 13 0 - 44 U/L   Alkaline Phosphatase 71 38 - 126 U/L   Total Bilirubin 0.6 0.3 - 1.2 mg/dL   GFR, Estimated >67 >89 mL/min    Comment: (NOTE) Calculated using the CKD-EPI Creatinine Equation (2021)    Anion gap 13 5 - 15    Comment: Performed at [facility], [address], [place], [place] [address]  CBC with Differential     Status: Abnormal   Collection Time: 01/11/23  8:24 PM  Result Value Ref Range   WBC 8.9 4.0 - 10.5 K/uL   RBC 4.36 4.22 - 5.81 MIL/uL   Hemoglobin 10.6 (L) 13.0 - 17.0 g/dL   HCT 75.1 (L) 02.5 - 85.2 %   MCV 78.2 (L) 80.0 - 100.0 fL   MCH 24.3 (L) 26.0 - 34.0 pg   MCHC 31.1 30.0 - 36.0 g/dL   RDW 77.8 24.2 - 35.3 %   Platelets 367 150 - 400 K/uL   nRBC 0.0 0.0 - 0.2 %   Neutrophils Relative % 84 %   Neutro Abs 7.5 1.7 - 7.7 K/uL   Lymphocytes Relative 9 %   Lymphs Abs 0.8 0.7 - 4.0 K/uL   Monocytes Relative 6 %   Monocytes Absolute 0.5 0.1 - 1.0 K/uL   Eosinophils Relative 0 %   Eosinophils Absolute 0.0 0.0 - 0.5 K/uL   Basophils Relative 0 %   Basophils Absolute 0.0 0.0 - 0.1 K/uL   Immature Granulocytes 1 %   Abs Immature Granulocytes 0.04 0.00 - 0.07 K/uL    Comment: Performed at [facility], [address], [place], [place] [address]  Protime-INR     Status: None   Collection Time: 01/11/23  8:24 PM  Result Value Ref Range   Prothrombin Time 14.5 11.4 - 15.2 seconds   INR 1.1 0.8 - 1.2    Comment: (NOTE) INR goal varies based on device and disease states. Performed at [facility], [address], [place], [place] [address]   APTT     Status: Abnormal   Collection Time: 01/11/23  8:24 PM  Result Value Ref Range   aPTT 37 (H) 24 - 36 seconds    Comment:        IF BASELINE aPTT IS ELEVATED, SUGGEST PATIENT RISK ASSESSMENT BE USED TO DETERMINE APPROPRIATE ANTICOAGULANT THERAPY. Performed at [facility], [address] [place], [place] [address]   Urinalysis, Routine w reflex microscopic -Urine, Clean Catch     Status: Abnormal   Collection Time: 01/11/23 11:11 PM  Result Value Ref Range   Color, Urine YELLOW YELLOW   APPearance HAZY (A) CLEAR   Specific Gravity, Urine 1.035 (H) 1.005 - 1.030   pH 5.0 5.0 - 8.0   Glucose, UA >=500 (A) NEGATIVE mg/dL   Hgb urine dipstick SMALL (A) NEGATIVE   Bilirubin Urine NEGATIVE NEGATIVE   Ketones, ur NEGATIVE NEGATIVE mg/dL   Protein, ur 30 (A) NEGATIVE mg/dL   Nitrite NEGATIVE NEGATIVE   Leukocytes,Ua TRACE (A) NEGATIVE   RBC / HPF 6-10 0 - 5 RBC/hpf   WBC, UA 21-50 0 - 5 WBC/hpf   Bacteria, UA RARE (A) NONE SEEN   Squamous Epithelial / HPF 0-5 0 - 5 /HPF   WBC Clumps PRESENT    Mucus PRESENT    Sperm, UA PRESENT     Comment: Performed at [facility], [address], [place], [place] [address]   CT ABDOMEN PELVIS WO CONTRAST  Result Date: 01/11/2023 CLINICAL DATA:  Abdominal pain, acute, nonlocalized suspected post op infection , s/p ileostomy takedown to Colostomy, + fever, drainage from rectal pouch and around Jp Drain. Most recent pelvic surgery 12/31/2022 EXAM: CT ABDOMEN AND PELVIS WITHOUT CONTRAST TECHNIQUE: Multidetector CT imaging of the abdomen and pelvis was performed following the standard protocol without IV contrast. RADIATION DOSE REDUCTION: This exam was performed according to the departmental dose-optimization program which includes automated exposure control, adjustment of the mA and/or kV according to patient size and/or use of iterative  reconstruction technique. COMPARISON:  CT abdomen pelvis 09/24/2022 FINDINGS: Lower chest: No acute abnormality. Hepatobiliary: No focal liver abnormality. Calcified gallstone noted within the gallbladder lumen. No gallbladder wall thickening or pericholecystic fluid. No biliary dilatation. Pancreas: No focal lesion. Normal pancreatic contour. No surrounding inflammatory changes. No main pancreatic ductal dilatation. Spleen: Normal in size without focal abnormality. Adrenals/Urinary Tract: No adrenal nodule bilaterally. Slight fullness of the left collecting system with no frank hydroureteronephrosis. The distal ureter dose course in the region the pelvic inflammatory changes. Left nephrolithiasis measuring 1 mm. No ureterolithiasis or hydroureter. The urinary bladder is unremarkable. Stomach/Bowel: Surgical changes consistent with a lower anterior resection. Pelvic surgical bed within the expected region of the previous rectum demonstrates soft tissue density mixed with fluid and gas. Surgical drain terminates within this region. Right lower quadrant small bowel resection. Right lower quadrant end colostomy formation. Stomach is within normal limits. No evidence of bowel wall thickening or dilatation. Status post appendectomy. Vascular/Lymphatic: No abdominal aorta or iliac aneurysm. Mild atherosclerotic plaque of the aorta and its branches. No abdominal, pelvic, or inguinal lymphadenopathy. Reproductive: Not visualized, surgically removed. Other: Extensive presacral soft tissue density with associated fluid and gas along the surgical bed. Musculoskeletal: Slight fat containing parastomal hernia along the right end colostomy formation. No cortical erosion or destruction. Similar-appearing vague periosteal reaction along the anterior sacral al a (6:20). No suspicious lytic or blastic osseous lesions. No acute displaced fracture. IMPRESSION: 1. Surgical changes consistent with a lower anterior resection. Pelvic  surgical bed within the expected region of the previous rectum demonstrates soft tissue density mixed with fluid and gas. Surgical drain terminates within this region. Markedly limited evaluation due to noncontrast (no IV and no PO contrast). Finding could represent combination of residual malignancy versus infection versus hemorrhage versus postop surgical changes. Correlate clinically. 2. Slight fullness of the left collecting system with no frank hydroureteronephrosis. The distal ureter dose course in the region the pelvic inflammatory changes. This may reflect developing changes of obstructive uropathy or reflux. 3. Stable vague periosteal reaction of the sacrum with no definite finding of osteomyelitis. 4. Other imaging findings of potential clinical significance: Cholelithiasis no CT finding of acute cholecystitis. Nonobstructive 1 mm left nephrolithiasis. Slight fat containing parastomal hernia along the right end colostomy formation. These results were called by telephone at the time of interpretation on 01/11/2023 at 10:43 pm to provider ABIGAIL HARRIS , who verbally acknowledged these results. Electronically Signed   By: Tish Frederickson M.D.   On: 01/11/2023 22:47   DG Chest Port 1 View  Result Date: 01/11/2023 CLINICAL DATA:  Questionable sepsis - evaluate for abnormality EXAM: PORTABLE CHEST 1 VIEW COMPARISON:  Chest x-ray 07/05/2020, CT chest 09/24/2022 FINDINGS: The heart and mediastinal contours are within normal limits. Similar-appearing prominent hilar vasculature. No focal consolidation. No pulmonary edema. No pleural effusion. No pneumothorax.  No acute osseous abnormality. IMPRESSION: No active disease. Electronically Signed   By: Tish Frederickson M.D.   On: 01/11/2023 21:48    Review of Systems  Constitutional:  Positive for fever.  HENT:  Negative for ear discharge, ear pain, hearing loss and tinnitus.   Eyes:  Negative for photophobia and pain.  Respiratory:  Negative for cough and  shortness of breath.   Cardiovascular:  Negative for chest pain.  Gastrointestinal:  Positive for abdominal pain. Negative for nausea and vomiting.  Genitourinary:  Negative for dysuria, flank pain, frequency and urgency.  Musculoskeletal:  Negative for back pain, myalgias and neck pain.  Neurological:  Negative for dizziness and headaches.  Hematological:  Does not bruise/bleed easily.  Psychiatric/Behavioral:  The patient is not nervous/anxious.     Blood pressure 128/78, pulse 95, temperature 99.4 F (37.4 C), temperature source Oral, resp. rate 16, height  (1.778 m), weight 86.1 kg, SpO2 99 %. Physical Exam  WDWN in NAD Abd - lower abdominal tenderness Healing surgical incisions JP drain with serous drainage coming around the drainage tube Serous drainage at anal closure incision.  Assessment/Plan Admit  CT scan with contrast  Antibiotic coverage will be based on CT findings NPO for now.  Wynona Luna, MD 01/12/2023, 2:27 AM

## 2023-01-13 LAB — GLUCOSE, CAPILLARY
Glucose-Capillary: 145 mg/dL — ABNORMAL HIGH (ref 70–99)
Glucose-Capillary: 163 mg/dL — ABNORMAL HIGH (ref 70–99)
Glucose-Capillary: 177 mg/dL — ABNORMAL HIGH (ref 70–99)

## 2023-01-13 LAB — CULTURE, BLOOD (ROUTINE X 2): Special Requests: ADEQUATE

## 2023-01-13 NOTE — TOC Progression Note (Signed)
Transition of Care [facility]) - Progression Note    Patient Details  Name: Steven [name] MRN: [id number] Date of Birth: 1971/04/10  Transition of Care [facility]) CM/SW Contact  Beckie Busing, RN Phone Number:[phone]  01/13/2023, 10:30 AM  Clinical Narrative:     Transition of Care [facility]) Screening Note   Patient Details  Name: Steven [name] Date of Birth: 1970-10-20   Transition of Care [facility]) CM/SW Contact:    Beckie Busing, RN Phone Number: 01/13/2023, 10:30 AM    Transition of Care Department [facility]) has reviewed patient and no TOC needs have been identified at this time. We will continue to monitor patient advancement through interdisciplinary progression rounds. If new patient transition needs arise, please place a TOC consult.          Expected Discharge Plan and Services         Expected Discharge Date: 01/13/23                                     Social Determinants of Health (SDOH) Interventions SDOH Screenings   Food Insecurity: No Food Insecurity (01/12/2023)  Housing: Low Risk  (01/12/2023)  Transportation Needs: No Transportation Needs (01/12/2023)  Utilities: Not At Risk (01/12/2023)  Tobacco Use: Low Risk  (01/11/2023)    Readmission Risk Interventions     No data to display

## 2023-01-13 NOTE — Discharge Instructions (Signed)
Strip JP tubing daily.

## 2023-01-13 NOTE — Discharge Summary (Signed)
Physician Discharge Summary  Patient ID: Steven [name] MRN: [id number] DOB/AGE: [birth year] 52 y.o.  Admit date: 01/11/2023 Discharge date: 01/13/2023  Admission Diagnoses:  Discharge Diagnoses:  Principal Problem:   Rectal cancer   Discharged Condition: good  Hospital Course: Pt admitted for IV contrast CT allergy protocol and eval for fever.  CT showed post operative changes and exam consistent with evacuating hematoma.  No further fevers after stripping tubing.    Consults: None  Significant Diagnostic Studies: labs: cbc, bmet  Treatments: IV hydration and analgesia: acetaminophen  Discharge Exam: Blood pressure 123/83, pulse 91, temperature 97.9 F (36.6 C), temperature source Oral, resp. rate 18, height  (1.778 m), weight 86.1 kg, SpO2 95 %. General appearance: alert and cooperative GI: normal findings: soft, non-tender JP: dark bloody Disposition: Discharge disposition: 01-Home or Self Care        Allergies as of 01/13/2023       Reactions   Contrast Media [iodinated Contrast Media] Hives, Nausea Only   As a 52 year old experienced hives and nausea        Medication List     TAKE these medications    acetaminophen 500 MG tablet Commonly known as: TYLENOL Take 1,000 mg by mouth every 6 (six) hours as needed for mild pain.   ALPRAZolam 0.5 MG tablet Commonly known as: XANAX Take 0.5 mg by mouth daily as needed for anxiety.   blood glucose meter kit and supplies Kit Dispense based on patient and insurance preference. Use up to four times daily as directed. (FOR ICD-9 250.00, 250.01).   calcium carbonate 500 MG chewable tablet Commonly known as: TUMS - dosed in mg elemental calcium Chew 2 tablets by mouth daily as needed for indigestion or heartburn.   dicyclomine 10 MG capsule Commonly known as: BENTYL Take 1 capsule (10 mg total) by mouth 3 (three) times daily as needed for spasms. Take before meals   fenofibrate 160 MG tablet Take 160 mg  by mouth daily.   gabapentin 300 MG capsule Commonly known as: NEURONTIN TAKE 1 CAPSULE(300 MG) BY MOUTH AT BEDTIME   ibuprofen 200 MG tablet Commonly known as: ADVIL Take 600 mg by mouth every 6 (six) hours as needed for moderate pain.   neomycin-bacitracin-polymyxin Oint Commonly known as: NEOSPORIN Apply 1 Application topically as needed for wound care.   ProAir HFA 108 (90 Base) MCG/ACT inhaler Generic drug: albuterol Inhale 2 puffs into the lungs every 4 (four) hours as needed for wheezing or shortness of breath.   tetrahydrozoline-zinc 0.05-0.25 % ophthalmic solution Commonly known as: VISINE-AC Place 2 drops into both eyes 4 (four) times daily as needed (allergy).   traMADol 50 MG tablet Commonly known as: ULTRAM Take 1-2 tablets (50-100 mg total) by mouth every 6 (six) hours as needed for moderate pain.   Xigduo XR 01-999 MG Tb24 Generic drug: Dapagliflozin Pro-metFORMIN ER Take 1 tablet by mouth 2 (two) times daily with a meal.        Follow-up Information     Romie Levee, MD. Go on 01/26/2023.   Specialties: General Surgery, Colon and Rectal Surgery Why: at 2:50pm Contact information: [address] [place] [address] [phone]                 Signed: Vanita Panda 01/13/2023, 8:58 AM

## 2023-01-15 ENCOUNTER — Ambulatory Visit: Payer: BC Managed Care – PPO | Admitting: Hematology and Oncology

## 2023-01-15 LAB — CULTURE, BLOOD (ROUTINE X 2): Culture: NO GROWTH

## 2023-01-17 DIAGNOSIS — G4733 Obstructive sleep apnea (adult) (pediatric): Secondary | ICD-10-CM | POA: Diagnosis not present

## 2023-01-17 LAB — CULTURE, BLOOD (ROUTINE X 2): Culture: NO GROWTH

## 2023-01-28 ENCOUNTER — Inpatient Hospital Stay: Payer: BC Managed Care – PPO | Attending: Hematology and Oncology | Admitting: Hematology and Oncology

## 2023-01-28 VITALS — BP 129/69 | HR 100 | Temp 97.5°F | Resp 18 | Ht 70.0 in | Wt 198.0 lb

## 2023-01-28 DIAGNOSIS — Z9221 Personal history of antineoplastic chemotherapy: Secondary | ICD-10-CM | POA: Diagnosis not present

## 2023-01-28 DIAGNOSIS — C20 Malignant neoplasm of rectum: Secondary | ICD-10-CM

## 2023-01-28 DIAGNOSIS — Z923 Personal history of irradiation: Secondary | ICD-10-CM | POA: Insufficient documentation

## 2023-01-28 DIAGNOSIS — Z933 Colostomy status: Secondary | ICD-10-CM | POA: Insufficient documentation

## 2023-01-28 DIAGNOSIS — Z803 Family history of malignant neoplasm of breast: Secondary | ICD-10-CM | POA: Insufficient documentation

## 2023-01-28 DIAGNOSIS — Z85048 Personal history of other malignant neoplasm of rectum, rectosigmoid junction, and anus: Secondary | ICD-10-CM | POA: Diagnosis not present

## 2023-01-28 NOTE — Assessment & Plan Note (Addendum)
This is a very pleasant 52 year old male patient with newly diagnosed T3BN0 adenocarcinoma of the rectum, MSS with no evidence of distant metastasis following up with medical oncology for consideration of concurrent chemoradiation.   He has T3N0 or stage IIa adenocarcinoma of the rectum and treatment recommendations from TB suggested concurrent chemotherapy radiation with Xeloda versus 5-fluorouracil followed by consideration for surgery and adjuvant chemotherapy   Given concern for 4 mm perirectal lymph node which is not entirely diagnostic for involvement, we have also discussed total neoadjuvant approach which patient was reluctant given some issues with his work and disability leave.  He wanted to proceed with neoadjuvant chemoradiation followed by surgery and adjuvant chemotherapy because of some logistics with his disability leave.  He started Xeloda on 04/03/2021, last date of treatment 05/10/2021.  Treatment complicated by some mild fatigue and tenesmus otherwise no major complications. He had definitive resection by Dr. Maisie Fus on July 17, 2021.  Pathology showed complete response with no residual carcinoma.  He completed 6 cycles of adjuvant Xeloda.   He is now s.p revision of ileostomy to permanent colostomy Nost recent abdominal imaging with no concern for malignancy We can consider chest/abd/pelvis imaging in July. Labs also will defer by a month or so.   Rachel Moulds MD

## 2023-01-28 NOTE — Progress Notes (Signed)
[facility] Cancer Follow up:    Steven Blamer, MD [address] [place] [address]   DIAGNOSIS:  Cancer Staging  Adenocarcinoma of rectum [facility]) Staging form: Colon and Rectum, AJCC 8th Edition - Clinical stage from 03/05/2021: Stage IIA (cT3, cN0, cM0) - Signed by Malachy Mood, MD on 07/29/2021 Stage prefix: Initial diagnosis Total positive nodes: 0 - Pathologic stage from 07/17/2021: ypT0, pN0, cM0 - Signed by Malachy Mood, MD on 07/29/2021 Stage prefix: Post-therapy Total positive nodes: 0 Residual tumor (R): R0 - None   SUMMARY OF ONCOLOGIC HISTORY:  He underwent CT AP 11/23/20 to rule out malignancy after PE. This showed asymmetric soft tissue in rectum. -colonoscopy 03/05/21 with Dr. Meridee Score revealed a palpable rectal mass on digital rectal exam, measuring 5 cm on scope. Pathology confirmed adenocarcinoma. -staging CT CAP 03/14/21 showed questionable tiny perirectal lymph nodes, but negative node on staging pelvic MRI 03/15/21. -he received neoadjuvant concurrent chemoradiation 04/03/21-05/10/21. -definitive resection on 07/17/21 under Dr. Maisie Fus. Pathology showed complete response with no residual carcinoma. Lymph nodes and margins negative. -He saw Dr. Maisie Fus on 08/19/21 for increasing postoperative rectal pain and was found to have some anastomotic dehiscence. Because of the slow healing and lack of improvement from antibiotics, there was delay in starting Xeloda and he was finally able to start Xeloda on 09/09/2021.   He completed 6 cycles of adjuvant xeloda.  CURRENT THERAPY: Observation  INTERVAL HISTORY:  Steven [name] 52 y.o. male returns for evaluation of his history of rectal cancer.   Since his last visit here, he had hospitalizations for revision of ileostomy to permanent colostomy and had some fevers post op, but no evidence of infection. He had recent imaging of the abdomen pelvis with no evidence of active malignancy Last staging scans  were in December which showed no evidence of malignancy. Last CEA was normal He is recovering well, less pain is today, appetite is good.  Rest of the pertinent 10 point ROS reviewed and negative.  Patient Active Problem List   Diagnosis Date Noted   Rectal cancer (HCC) 01/11/2023   Dehiscence of anastomosis of large intestine 12/31/2022   Peripheral neuropathy due to chemotherapy (HCC) 07/07/2022   Cancer related pain 04/30/2021   Tenesmus 04/16/2021   Chemotherapy-induced nausea 04/03/2021   Adenocarcinoma of rectum (HCC) 03/21/2021   Colon cancer screening 12/26/2020   Abnormal finding on CT scan 12/26/2020   History of rectal bleeding 12/26/2020   Gastroesophageal reflux disease 12/26/2020   Diabetes mellitus due to underlying condition, uncontrolled, with hyperglycemia, without long-term current use of insulin (HCC) 04/26/2019   Obesity (BMI 30-39.9) 04/26/2019   Asthma    Anxiety    OSA (obstructive sleep apnea)    HLD (hyperlipidemia)    Bilateral pulmonary embolism (HCC) 04/24/2019    is allergic to contrast media [iodinated contrast media].  MEDICAL HISTORY: Past Medical History:  Diagnosis Date   Anemia 02/2021   iron   Anxiety    Asthma    Cancer (HCC) 02/2021   Depression    Diabetes mellitus without complication (HCC)    History of kidney stones    HLD (hyperlipidemia)    Hypertension    Kidney stones    Neuromuscular disorder (HCC)    from radiation bi lat   OSA (obstructive sleep apnea)    CPAP   Peripheral vascular disease (HCC)    DVT   Sleep apnea     SURGICAL HISTORY: Past Surgical History:  Procedure  Laterality Date   ABDOMINAL SURGERY     APPENDECTOMY  1989   ag   COLONOSCOPY  03/05/2021   CYST EXCISION  2011   left neck   DIVERTING ILEOSTOMY N/A 07/17/2021   Procedure: DIVERTING ILEOSTOMY;  Surgeon: Romie Levee, MD;  Location: [facility];  Service: General;  Laterality: N/A;   FLEXIBLE SIGMOIDOSCOPY N/A 04/04/2022   Procedure:  FLEXIBLE SIGMOIDOSCOPY WITH POSSIBLE DEBRIDEMENT;  Surgeon: Romie Levee, MD;  Location: [facility];  Service: General;  Laterality: N/A;   RECTAL EXAM UNDER ANESTHESIA N/A 04/04/2022   Procedure: ANAL EXAM UNDER ANESTHESIA;  Surgeon: Romie Levee, MD;  Location: [facility] [place];  Service: General;  Laterality: N/A;   WISDOM TOOTH EXTRACTION     age 73   XI ROBOTIC ASSISTED LOWER ANTERIOR RESECTION N/A 07/17/2021   Procedure: XI ROBOTIC ASSISTED LOWER ANTERIOR RESECTION;  Surgeon: Romie Levee, MD;  Location: [facility];  Service: General;  Laterality: N/A;   XI ROBOTIC ASSISTED LOWER ANTERIOR RESECTION N/A 12/31/2022   Procedure: ROBOTIC ASSISTED LOWER ANTERIOR RESECTION WITH OSTOMY with intraop ICG guided perfusion, takedown of iliostomy;  Surgeon: Romie Levee, MD;  Location: [facility];  Service: General;  Laterality: N/A;    SOCIAL HISTORY: Social History   Socioeconomic History   Marital status: Married    Spouse name: Not on file   Number of children: 3   Years of education: Not on file   Highest education level: Not on file  Occupational History   Occupation: Systems analyst  Tobacco Use   Smoking status: Never   Smokeless tobacco: Never  Vaping Use   Vaping Use: Never used  Substance and Sexual Activity   Alcohol use: Yes    Comment: 1 beer every 3-4 months   Drug use: Never   Sexual activity: Yes  Other Topics Concern   Not on file  Social History Narrative   Not on file   Social Determinants of Health   Financial Resource Strain: Not on file  Food Insecurity: No Food Insecurity (01/12/2023)   Hunger Vital Sign    Worried About Running Out of Food in the Last Year: Never true    Ran Out of Food in the Last Year: Never true  Transportation Needs: No Transportation Needs (01/12/2023)   PRAPARE - Administrator, Civil Service (Medical): No    Lack of Transportation (Non-Medical): No  Physical Activity: Not on file  Stress:  Not on file  Social Connections: Not on file  Intimate Partner Violence: Not At Risk (01/12/2023)   Humiliation, Afraid, Rape, and Kick questionnaire    Fear of Current or Ex-Partner: No    Emotionally Abused: No    Physically Abused: No    Sexually Abused: No    FAMILY HISTORY: Family History  Problem Relation Age of Onset   Diabetes Mellitus II Mother    Breast cancer Mother 38   Thrombophlebitis Father    Breast cancer Maternal Grandmother 42   Diabetes Maternal Grandmother    Breast cancer Maternal Aunt 60   Diabetes Maternal Aunt    Hypertension Paternal Grandmother    Colon cancer Neg Hx    Esophageal cancer Neg Hx    Inflammatory bowel disease Neg Hx    Liver disease Neg Hx    Pancreatic cancer Neg Hx    Rectal cancer Neg Hx    Stomach cancer Neg Hx     Review of Systems  Constitutional:  Negative for appetite  change, chills, fatigue, fever and unexpected weight change.  HENT:   Negative for hearing loss, lump/mass and trouble swallowing.   Eyes:  Negative for eye problems and icterus.  Respiratory:  Negative for chest tightness, cough and shortness of breath.   Cardiovascular:  Negative for chest pain, leg swelling and palpitations.  Gastrointestinal:  Negative for abdominal distention, abdominal pain, constipation, diarrhea, nausea and vomiting.  Endocrine: Negative for hot flashes.  Genitourinary:  Negative for difficulty urinating.   Musculoskeletal:  Negative for arthralgias.  Skin:  Negative for itching and rash.  Neurological:  Positive for numbness. Negative for dizziness, extremity weakness and headaches.  Hematological:  Negative for adenopathy. Does not bruise/bleed easily.  Psychiatric/Behavioral:  Negative for depression. The patient is not nervous/anxious.       PHYSICAL EXAMINATION  ECOG PERFORMANCE STATUS: 1 - Symptomatic but completely ambulatory  Vitals:   01/28/23 1302  BP: 129/69  Pulse: 100  Resp: 18  Temp: (!) 97.5 F (36.4 C)   SpO2: 100%    PE not done, telephone visit.  LABORATORY DATA:  CBC    Component Value Date/Time   WBC 5.9 01/12/2023 0608   RBC 4.00 (L) 01/12/2023 0608   HGB 9.7 (L) 01/12/2023 0608   HGB 11.7 (L) 12/05/2021 0956   HCT 31.3 (L) 01/12/2023 0608   PLT 357 01/12/2023 0608   PLT 274 12/05/2021 0956   MCV 78.3 (L) 01/12/2023 0608   MCH 24.3 (L) 01/12/2023 0608   MCHC 31.0 01/12/2023 0608   RDW 15.3 01/12/2023 0608   LYMPHSABS 0.8 01/11/2023 2024   MONOABS 0.5 01/11/2023 2024   EOSABS 0.0 01/11/2023 2024   BASOSABS 0.0 01/11/2023 2024    CMP     Component Value Date/Time   NA 131 (L) 01/12/2023 0608   K 4.0 01/12/2023 0608   CL 98 01/12/2023 0608   CO2 20 (L) 01/12/2023 0608   GLUCOSE 220 (H) 01/12/2023 0608   BUN 22 (H) 01/12/2023 0608   CREATININE 1.12 01/12/2023 0608   CREATININE 0.93 12/05/2021 0956   CALCIUM 8.3 (L) 01/12/2023 0608   PROT 6.8 01/11/2023 2024   ALBUMIN 3.0 (L) 01/11/2023 2024   AST 15 01/11/2023 2024   AST 11 (L) 12/05/2021 0956   ALT 13 01/11/2023 2024   ALT 14 12/05/2021 0956   ALKPHOS 71 01/11/2023 2024   BILITOT 0.6 01/11/2023 2024   BILITOT 0.5 12/05/2021 0956   GFRNONAA >60 01/12/2023 0608   GFRNONAA >60 12/05/2021 0956   GFRAA >60 04/25/2019 0255        ASSESSMENT and THERAPY PLAN:   Adenocarcinoma of rectum (HCC) This is a very pleasant 52 year old male patient with newly diagnosed T3BN0 adenocarcinoma of the rectum, MSS with no evidence of distant metastasis following up with medical oncology for consideration of concurrent chemoradiation.   He has T3N0 or stage IIa adenocarcinoma of the rectum and treatment recommendations from TB suggested concurrent chemotherapy radiation with Xeloda versus 5-fluorouracil followed by consideration for surgery and adjuvant chemotherapy   Given concern for 4 mm perirectal lymph node which is not entirely diagnostic for involvement, we have also discussed total neoadjuvant approach which  patient was reluctant given some issues with his work and disability leave.  He wanted to proceed with neoadjuvant chemoradiation followed by surgery and adjuvant chemotherapy because of some logistics with his disability leave.  He started Xeloda on 04/03/2021, last date of treatment 05/10/2021.  Treatment complicated by some mild fatigue and tenesmus otherwise no major complications.  He had definitive resection by Dr. Maisie Fus on July 17, 2021.  Pathology showed complete response with no residual carcinoma.  He completed 6 cycles of adjuvant Xeloda.   He is now s.p revision of ileostomy to permanent colostomy Nost recent abdominal imaging with no concern for malignancy We can consider chest/abd/pelvis imaging in July. Labs also will defer by a month or so.   Rachel Moulds MD   All questions were answered. The patient knows to call the clinic with any problems, questions or concerns. We can certainly see the patient much sooner if necessary.   *Total Encounter Time as defined by the Centers for Medicare and Medicaid Services includes, in addition to the face-to-face time of a patient visit (documented in the note above) non-face-to-face time: obtaining and reviewing outside history, ordering and reviewing medications, tests or procedures, care coordination (communications with other health care professionals or caregivers) and documentation in the medical record.

## 2023-02-04 ENCOUNTER — Other Ambulatory Visit (HOSPITAL_BASED_OUTPATIENT_CLINIC_OR_DEPARTMENT_OTHER): Payer: Self-pay | Admitting: General Surgery

## 2023-02-04 DIAGNOSIS — C20 Malignant neoplasm of rectum: Secondary | ICD-10-CM

## 2023-02-06 DIAGNOSIS — C20 Malignant neoplasm of rectum: Secondary | ICD-10-CM | POA: Diagnosis not present

## 2023-02-06 DIAGNOSIS — Z932 Ileostomy status: Secondary | ICD-10-CM | POA: Diagnosis not present

## 2023-02-16 DIAGNOSIS — G4733 Obstructive sleep apnea (adult) (pediatric): Secondary | ICD-10-CM | POA: Diagnosis not present

## 2023-03-04 ENCOUNTER — Other Ambulatory Visit: Payer: Self-pay

## 2023-03-04 ENCOUNTER — Inpatient Hospital Stay: Payer: BC Managed Care – PPO | Attending: Hematology and Oncology

## 2023-03-04 DIAGNOSIS — Z85048 Personal history of other malignant neoplasm of rectum, rectosigmoid junction, and anus: Secondary | ICD-10-CM | POA: Insufficient documentation

## 2023-03-04 DIAGNOSIS — C20 Malignant neoplasm of rectum: Secondary | ICD-10-CM

## 2023-03-04 LAB — COMPREHENSIVE METABOLIC PANEL
ALT: 7 U/L (ref 0–44)
AST: 8 U/L — ABNORMAL LOW (ref 15–41)
Albumin: 3.8 g/dL (ref 3.5–5.0)
Alkaline Phosphatase: 73 U/L (ref 38–126)
Anion gap: 8 (ref 5–15)
BUN: 19 mg/dL (ref 6–20)
CO2: 26 mmol/L (ref 22–32)
Calcium: 9.5 mg/dL (ref 8.9–10.3)
Chloride: 106 mmol/L (ref 98–111)
Creatinine, Ser: 1.06 mg/dL (ref 0.61–1.24)
GFR, Estimated: >60 mL/min (ref >60)
Glucose, Bld: 128 mg/dL — ABNORMAL HIGH (ref 70–99)
Potassium: 4.5 mmol/L (ref 3.5–5.1)
Sodium: 140 mmol/L (ref 135–145)
Total Bilirubin: 0.4 mg/dL (ref 0.3–1.2)
Total Protein: 7.2 g/dL (ref 6.5–8.1)

## 2023-03-04 LAB — CBC WITH DIFFERENTIAL/PLATELET
Abs Immature Granulocytes: 0.02 10*3/uL (ref 0.00–0.07)
Basophils Absolute: 0 10*3/uL (ref 0.0–0.1)
Basophils Relative: 0 %
Eosinophils Absolute: 0.2 10*3/uL (ref 0.0–0.5)
Eosinophils Relative: 2 %
HCT: 30.7 % — ABNORMAL LOW (ref 39.0–52.0)
Hemoglobin: 9.5 g/dL — ABNORMAL LOW (ref 13.0–17.0)
Immature Granulocytes: 0 %
Lymphocytes Relative: 38 %
Lymphs Abs: 2.3 10*3/uL (ref 0.7–4.0)
MCH: 22.3 pg — ABNORMAL LOW (ref 26.0–34.0)
MCHC: 30.9 g/dL (ref 30.0–36.0)
MCV: 72.1 fL — ABNORMAL LOW (ref 80.0–100.0)
Monocytes Absolute: 0.4 10*3/uL (ref 0.1–1.0)
Monocytes Relative: 7 %
Neutro Abs: 3.2 10*3/uL (ref 1.7–7.7)
Neutrophils Relative %: 53 %
Platelets: 385 10*3/uL (ref 150–400)
RBC: 4.26 MIL/uL (ref 4.22–5.81)
RDW: 16.3 % — ABNORMAL HIGH (ref 11.5–15.5)
WBC: 6.2 10*3/uL (ref 4.0–10.5)
nRBC: 0 % (ref 0.0–0.2)

## 2023-03-05 ENCOUNTER — Encounter: Payer: Self-pay | Admitting: *Deleted

## 2023-03-09 ENCOUNTER — Encounter (HOSPITAL_COMMUNITY): Payer: Self-pay

## 2023-03-09 ENCOUNTER — Ambulatory Visit (HOSPITAL_COMMUNITY)
Admission: RE | Admit: 2023-03-09 | Discharge: 2023-03-09 | Disposition: A | Discharge status: Home / Self Care | Payer: BC Managed Care – PPO | Source: Ambulatory Visit | Attending: General Surgery | Admitting: General Surgery

## 2023-03-09 DIAGNOSIS — C20 Malignant neoplasm of rectum: Secondary | ICD-10-CM | POA: Diagnosis not present

## 2023-03-09 DIAGNOSIS — Z85040 Personal history of malignant carcinoid tumor of rectum: Secondary | ICD-10-CM | POA: Diagnosis not present

## 2023-03-09 DIAGNOSIS — R161 Splenomegaly, not elsewhere classified: Secondary | ICD-10-CM | POA: Diagnosis not present

## 2023-03-09 MED ORDER — IOHEXOL 9 MG/ML PO SOLN
ORAL | Status: AC
  Filled 2023-03-09: qty 1000

## 2023-03-09 MED ORDER — IOHEXOL 300 MG/ML  SOLN
100.0000 mL | Freq: Once | INTRAMUSCULAR | Status: AC | PRN
  Administered 2023-03-09: 100 mL via INTRAVENOUS

## 2023-03-09 MED ORDER — SODIUM CHLORIDE (PF) 0.9 % IJ SOLN
INTRAMUSCULAR | Status: AC
  Filled 2023-03-09: qty 50

## 2023-03-13 DIAGNOSIS — C20 Malignant neoplasm of rectum: Secondary | ICD-10-CM | POA: Diagnosis not present

## 2023-03-13 DIAGNOSIS — Z932 Ileostomy status: Secondary | ICD-10-CM | POA: Diagnosis not present

## 2023-03-17 ENCOUNTER — Ambulatory Visit (HOSPITAL_COMMUNITY)
Admission: RE | Admit: 2023-03-17 | Discharge: 2023-03-17 | Disposition: A | Discharge status: Home / Self Care | Payer: BC Managed Care – PPO | Source: Ambulatory Visit | Attending: Hematology and Oncology | Admitting: Hematology and Oncology

## 2023-03-17 ENCOUNTER — Other Ambulatory Visit: Payer: Self-pay | Admitting: Hematology and Oncology

## 2023-03-17 DIAGNOSIS — I10 Essential (primary) hypertension: Secondary | ICD-10-CM | POA: Diagnosis not present

## 2023-03-17 DIAGNOSIS — E7800 Pure hypercholesterolemia, unspecified: Secondary | ICD-10-CM | POA: Diagnosis not present

## 2023-03-17 DIAGNOSIS — M7989 Other specified soft tissue disorders: Secondary | ICD-10-CM | POA: Diagnosis not present

## 2023-03-17 DIAGNOSIS — Z125 Encounter for screening for malignant neoplasm of prostate: Secondary | ICD-10-CM | POA: Diagnosis not present

## 2023-03-17 DIAGNOSIS — N5201 Erectile dysfunction due to arterial insufficiency: Secondary | ICD-10-CM | POA: Diagnosis not present

## 2023-03-17 DIAGNOSIS — E1165 Type 2 diabetes mellitus with hyperglycemia: Secondary | ICD-10-CM | POA: Diagnosis not present

## 2023-03-17 DIAGNOSIS — N529 Male erectile dysfunction, unspecified: Secondary | ICD-10-CM | POA: Diagnosis not present

## 2023-03-17 NOTE — Progress Notes (Signed)
Left lower extremity venous duplex has been completed. Preliminary results can be found in CV Proc through chart review.  Results were given to [facility] at Dr. Remonia Richter office.  03/17/23 12:40 PM Olen Cordial RVT

## 2023-03-17 NOTE — Progress Notes (Signed)
Vasc US ordered for abnormality in left common femoral vein which was thought to be a mixing artifact vs a venous thrombus.  Thanks,

## 2023-03-18 ENCOUNTER — Telehealth: Payer: Self-pay | Admitting: *Deleted

## 2023-03-18 ENCOUNTER — Other Ambulatory Visit: Payer: Self-pay | Admitting: Hematology and Oncology

## 2023-03-18 MED ORDER — RIVAROXABAN (XARELTO) VTE STARTER PACK (15 & 20 MG): ORAL_TABLET | ORAL | 0 refills | Status: DC

## 2023-03-18 NOTE — Progress Notes (Signed)
Given the age indeterminate DVT, we will start him on anticoagulation again. He appears to be clinically asymptomatic.  Malaiyah Achorn

## 2023-03-18 NOTE — Telephone Encounter (Signed)
-----   Message from Rachel Moulds, MD sent at 03/18/2023  8:01 AM EDT ----- [name],   Can u call him and talk to him to see if has any acute symptoms of DVT. I would feel comfortable to start him on blood thinners since its age indeterminate. We can start him on Eliquis starter pack. I can see him next week if needed.

## 2023-03-18 NOTE — Telephone Encounter (Addendum)
-----   Message from Rachel Moulds, MD sent at 03/18/2023  8:01 AM EDT ----- [name],   Can u call him and talk to him to see if has any acute symptoms of DVT. I would feel comfortable to start him on blood thinners since its age indeterminate. We can start him on Eliquis starter pack. I can see him next week if needed.  This RN contacted pt per above- discussed - he states per prior use of blood thinners - his insurance preferred him to use xeralto. Informed MD who sent xeralto starter pak to verified pharmacy.  Of note pt denies any symptoms- he states no swelling or pain - with doppler done because of "shadow on CT"

## 2023-03-19 DIAGNOSIS — G4733 Obstructive sleep apnea (adult) (pediatric): Secondary | ICD-10-CM | POA: Diagnosis not present

## 2023-03-23 ENCOUNTER — Encounter: Payer: Self-pay | Admitting: Hematology and Oncology

## 2023-03-30 ENCOUNTER — Other Ambulatory Visit: Payer: Self-pay | Admitting: *Deleted

## 2023-04-01 NOTE — Progress Notes (Signed)
error 

## 2023-04-08 ENCOUNTER — Other Ambulatory Visit: Payer: Self-pay

## 2023-04-08 ENCOUNTER — Inpatient Hospital Stay: Payer: BC Managed Care – PPO | Attending: Hematology and Oncology | Admitting: Hematology and Oncology

## 2023-04-08 ENCOUNTER — Inpatient Hospital Stay: Payer: BC Managed Care – PPO

## 2023-04-08 VITALS — BP 141/79 | HR 95 | Temp 98.2°F | Resp 18 | Ht 70.0 in | Wt 188.8 lb

## 2023-04-08 DIAGNOSIS — Z7901 Long term (current) use of anticoagulants: Secondary | ICD-10-CM | POA: Insufficient documentation

## 2023-04-08 DIAGNOSIS — Z85048 Personal history of other malignant neoplasm of rectum, rectosigmoid junction, and anus: Secondary | ICD-10-CM | POA: Insufficient documentation

## 2023-04-08 DIAGNOSIS — Z800 Family history of malignant neoplasm of digestive organs: Secondary | ICD-10-CM | POA: Diagnosis not present

## 2023-04-08 DIAGNOSIS — Z923 Personal history of irradiation: Secondary | ICD-10-CM | POA: Insufficient documentation

## 2023-04-08 DIAGNOSIS — Z9221 Personal history of antineoplastic chemotherapy: Secondary | ICD-10-CM | POA: Diagnosis not present

## 2023-04-08 DIAGNOSIS — Z803 Family history of malignant neoplasm of breast: Secondary | ICD-10-CM | POA: Diagnosis not present

## 2023-04-08 DIAGNOSIS — C20 Malignant neoplasm of rectum: Secondary | ICD-10-CM

## 2023-04-08 DIAGNOSIS — Z86718 Personal history of other venous thrombosis and embolism: Secondary | ICD-10-CM | POA: Insufficient documentation

## 2023-04-08 LAB — CMP (CANCER CENTER ONLY)
ALT: 7 U/L (ref 0–44)
AST: 8 U/L — ABNORMAL LOW (ref 15–41)
Albumin: 3.7 g/dL (ref 3.5–5.0)
Alkaline Phosphatase: 64 U/L (ref 38–126)
Anion gap: 10 (ref 5–15)
BUN: 20 mg/dL (ref 6–20)
CO2: 22 mmol/L (ref 22–32)
Calcium: 9.5 mg/dL (ref 8.9–10.3)
Chloride: 106 mmol/L (ref 98–111)
Creatinine: 1.17 mg/dL (ref 0.61–1.24)
GFR, Estimated: >60 mL/min (ref >60)
Glucose, Bld: 156 mg/dL — ABNORMAL HIGH (ref 70–99)
Potassium: 4.1 mmol/L (ref 3.5–5.1)
Sodium: 138 mmol/L (ref 135–145)
Total Bilirubin: 0.4 mg/dL (ref 0.3–1.2)
Total Protein: 7.3 g/dL (ref 6.5–8.1)

## 2023-04-08 LAB — CEA (ACCESS): CEA (CHCC): 1.01 ng/mL (ref 0.00–5.00)

## 2023-04-08 NOTE — Assessment & Plan Note (Addendum)
This is a very pleasant 52 year old male patient with newly diagnosed T3BN0 adenocarcinoma of the rectum, MSS with no evidence of distant metastasis following up with medical oncology for consideration of concurrent chemoradiation.   He has T3N0 or stage IIa adenocarcinoma of the rectum and treatment recommendations from TB suggested concurrent chemotherapy radiation with Xeloda versus 5-fluorouracil followed by consideration for surgery and adjuvant chemotherapy   Given concern for 4 mm perirectal lymph node which is not entirely diagnostic for involvement, we have also discussed total neoadjuvant approach which patient was reluctant given some issues with his work and disability leave.  He wanted to proceed with neoadjuvant chemoradiation followed by surgery and adjuvant chemotherapy because of some logistics with his disability leave.  He started Xeloda on 04/03/2021, last date of treatment 05/10/2021.  Treatment complicated by some mild fatigue and tenesmus otherwise no major complications. He had definitive resection by Dr. Maisie Fus on July 17, 2021.  Pathology showed complete response with no residual carcinoma.  He completed 6 cycles of adjuvant Xeloda.   He is now s.p revision of ileostomy to permanent colostomy Nost recent abdominal imaging with no concern for malignancy however he was found to have an age-indeterminate blood clot in the left lower extremity.  This is his third episode of DVT.  Back in 2020 he had a PE, he had another PE and DVT in 2022 which is what triggered the imaging to look for occult malignancy.  He has no clinical symptoms of DVT or PE today.  Since this is his third episode, have discussed about indefinite anticoagulation although he is hesitant.  We have hence discussed that if he remains cancer free at 5 years from diagnosis, will discuss about prophylactic anticoagulation.  He is fine with this plan.  At this time since he is 2 years out from the diagnosis, he wants to  stretch the imaging to once a year, I have sent a message to Dr. Maisie Fus if she is fine with this plan.  He will otherwise repeat CBC, CMP, CEA as well as antiphospholipid antibody panel today.  His previous hypercoagulable workup was without any evidence of inherited disorders.    Rachel Moulds MD

## 2023-04-08 NOTE — Progress Notes (Signed)
[facility] Cancer Follow up:    Steven Retort, MD [phone] [address] [place] [address]   DIAGNOSIS:  Cancer Staging  Adenocarcinoma of rectum [facility]) Staging form: Colon and Rectum, AJCC 8th Edition - Clinical stage from 03/05/2021: Stage IIA (cT3, cN0, cM0) - Signed by Malachy Mood, MD on 07/29/2021 Stage prefix: Initial diagnosis Total positive nodes: 0 - Pathologic stage from 07/17/2021: ypT0, pN0, cM0 - Signed by Malachy Mood, MD on 07/29/2021 Stage prefix: Post-therapy Total positive nodes: 0 Residual tumor (R): R0 - None   SUMMARY OF ONCOLOGIC HISTORY:  He underwent CT AP 11/23/20 to rule out malignancy after PE. This showed asymmetric soft tissue in rectum. -colonoscopy 03/05/21 with Dr. Meridee Score revealed a palpable rectal mass on digital rectal exam, measuring 5 cm on scope. Pathology confirmed adenocarcinoma. -staging CT CAP 03/14/21 showed questionable tiny perirectal lymph nodes, but negative node on staging pelvic MRI 03/15/21. -he received neoadjuvant concurrent chemoradiation 04/03/21-05/10/21. -definitive resection on 07/17/21 under Dr. Maisie Fus. Pathology showed complete response with no residual carcinoma. Lymph nodes and margins negative. -He saw Dr. Maisie Fus on 08/19/21 for increasing postoperative rectal pain and was found to have some anastomotic dehiscence. Because of the slow healing and lack of improvement from antibiotics, there was delay in starting Xeloda and he was finally able to start Xeloda on 09/09/2021.   He completed 6 cycles of adjuvant xeloda.   CURRENT THERAPY: Observation  INTERVAL HISTORY:  Steven [name] 52 y.o. male returns for evaluation of his history of rectal cancer.   Since his last visit here, he had imaging which showed interval removal of RLQ approach drainage catheter terminating with the presacral fluid collection with resolution of gaseous component. Circumferential soft tissue thickening and edema appear similar.  Similar 13 mm right common iliac LN, likely reactive, splenomegaly. He also was found to have  Central hypoattenuation within the left common femoral vein, which may represent mixing artifact or nonocclusive thrombus. Recommend correlation with lower extremity venous Doppler ultrasound examination. He had vasc US which showed age-indeterminate DVT in the left common femoral vein, popliteal, posterior tibial and peroneal vein.  We restarted him on anticoagulation and is here for follow-up.  He denies any lower extremity swelling, chest pain or shortness of breath.  Continues to deal with the postop issues but overall his pain, tenesmus has been tolerable.  Rest of the pertinent 10 point ROS reviewed and negative.  Patient Active Problem List   Diagnosis Date Noted   Rectal cancer (HCC) 01/11/2023   Dehiscence of anastomosis of large intestine 12/31/2022   Peripheral neuropathy due to chemotherapy (HCC) 07/07/2022   Cancer related pain 04/30/2021   Tenesmus 04/16/2021   Chemotherapy-induced nausea 04/03/2021   Adenocarcinoma of rectum (HCC) 03/21/2021   Colon cancer screening 12/26/2020   Abnormal finding on CT scan 12/26/2020   History of rectal bleeding 12/26/2020   Gastroesophageal reflux disease 12/26/2020   Diabetes mellitus due to underlying condition, uncontrolled, with hyperglycemia, without long-term current use of insulin (HCC) 04/26/2019   Obesity (BMI 30-39.9) 04/26/2019   Asthma    Anxiety    OSA (obstructive sleep apnea)    HLD (hyperlipidemia)    Bilateral pulmonary embolism (HCC) 04/24/2019    is allergic to contrast media [iodinated contrast media].  MEDICAL HISTORY: Past Medical History:  Diagnosis Date   Anemia 02/2021   iron   Anxiety    Asthma    Depression    Diabetes mellitus without complication (HCC)  History of kidney stones    HLD (hyperlipidemia)    Hypertension    Kidney stones    Neuromuscular disorder (HCC)    from radiation bi lat   OSA  (obstructive sleep apnea)    CPAP   Peripheral vascular disease (HCC)    DVT   rectal ca 02/2021   Sleep apnea     SURGICAL HISTORY: Past Surgical History:  Procedure Laterality Date   ABDOMINAL SURGERY     APPENDECTOMY  1989   ag   COLONOSCOPY  03/05/2021   CYST EXCISION  2011   left neck   DIVERTING ILEOSTOMY N/A 07/17/2021   Procedure: DIVERTING ILEOSTOMY;  Surgeon: Romie Levee, MD;  Location: [facility];  Service: General;  Laterality: N/A;   FLEXIBLE SIGMOIDOSCOPY N/A 04/04/2022   Procedure: FLEXIBLE SIGMOIDOSCOPY WITH POSSIBLE DEBRIDEMENT;  Surgeon: Romie Levee, MD;  Location: [facility];  Service: General;  Laterality: N/A;   RECTAL EXAM UNDER ANESTHESIA N/A 04/04/2022   Procedure: ANAL EXAM UNDER ANESTHESIA;  Surgeon: Romie Levee, MD;  Location: [facility] [place];  Service: General;  Laterality: N/A;   WISDOM TOOTH EXTRACTION     age 66   XI ROBOTIC ASSISTED LOWER ANTERIOR RESECTION N/A 07/17/2021   Procedure: XI ROBOTIC ASSISTED LOWER ANTERIOR RESECTION;  Surgeon: Romie Levee, MD;  Location: [facility];  Service: General;  Laterality: N/A;   XI ROBOTIC ASSISTED LOWER ANTERIOR RESECTION N/A 12/31/2022   Procedure: ROBOTIC ASSISTED LOWER ANTERIOR RESECTION WITH OSTOMY with intraop ICG guided perfusion, takedown of iliostomy;  Surgeon: Romie Levee, MD;  Location: [facility];  Service: General;  Laterality: N/A;    SOCIAL HISTORY: Social History   Socioeconomic History   Marital status: Married    Spouse name: Not on file   Number of children: 3   Years of education: Not on file   Highest education level: Not on file  Occupational History   Occupation: Systems analyst  Tobacco Use   Smoking status: Never   Smokeless tobacco: Never  Vaping Use   Vaping Use: Never used  Substance and Sexual Activity   Alcohol use: Yes    Comment: 1 beer every 3-4 months   Drug use: Never   Sexual activity: Yes  Other Topics Concern   Not on file   Social History Narrative   Not on file   Social Determinants of Health   Financial Resource Strain: Not on file  Food Insecurity: No Food Insecurity (01/12/2023)   Hunger Vital Sign    Worried About Running Out of Food in the Last Year: Never true    Ran Out of Food in the Last Year: Never true  Transportation Needs: No Transportation Needs (01/12/2023)   PRAPARE - Administrator, Civil Service (Medical): No    Lack of Transportation (Non-Medical): No  Physical Activity: Not on file  Stress: Not on file  Social Connections: Not on file  Intimate Partner Violence: Not At Risk (01/12/2023)   Humiliation, Afraid, Rape, and Kick questionnaire    Fear of Current or Ex-Partner: No    Emotionally Abused: No    Physically Abused: No    Sexually Abused: No    FAMILY HISTORY: Family History  Problem Relation Age of Onset   Diabetes Mellitus II Mother    Breast cancer Mother 28   Thrombophlebitis Father    Breast cancer Maternal Grandmother 60   Diabetes Maternal Grandmother    Breast cancer Maternal Aunt 60   Diabetes  Maternal Aunt    Hypertension Paternal Grandmother    Colon cancer Neg Hx    Esophageal cancer Neg Hx    Inflammatory bowel disease Neg Hx    Liver disease Neg Hx    Pancreatic cancer Neg Hx    Rectal cancer Neg Hx    Stomach cancer Neg Hx     Review of Systems  Constitutional:  Negative for appetite change, chills, fatigue, fever and unexpected weight change.  HENT:   Negative for hearing loss, lump/mass and trouble swallowing.   Eyes:  Negative for eye problems and icterus.  Respiratory:  Negative for chest tightness, cough and shortness of breath.   Cardiovascular:  Negative for chest pain, leg swelling and palpitations.  Gastrointestinal:  Negative for abdominal distention, abdominal pain, constipation, diarrhea, nausea and vomiting.  Endocrine: Negative for hot flashes.  Genitourinary:  Negative for difficulty urinating.   Musculoskeletal:   Negative for arthralgias.  Skin:  Negative for itching and rash.  Neurological:  Positive for numbness. Negative for dizziness, extremity weakness and headaches.  Hematological:  Negative for adenopathy. Does not bruise/bleed easily.  Psychiatric/Behavioral:  Negative for depression. The patient is not nervous/anxious.       PHYSICAL EXAMINATION  ECOG PERFORMANCE STATUS: 1 - Symptomatic but completely ambulatory  Vitals:   04/08/23 1201  BP: (!) 141/79  Pulse: 95  Resp: 18  Temp: 98.2 F (36.8 C)  SpO2: 100%   Physical Exam Constitutional:      Appearance: Normal appearance.  Cardiovascular:     Rate and Rhythm: Normal rate and regular rhythm.  Pulmonary:     Effort: Pulmonary effort is normal.     Breath sounds: Normal breath sounds.  Abdominal:     General: Abdomen is flat. Bowel sounds are normal.  Musculoskeletal:     Cervical back: Normal range of motion and neck supple. No rigidity.  Lymphadenopathy:     Cervical: No cervical adenopathy.  Skin:    General: Skin is warm and dry.  Neurological:     General: No focal deficit present.     Mental Status: He is alert.  Psychiatric:        Mood and Affect: Mood normal.      LABORATORY DATA:  CBC    Component Value Date/Time   WBC 6.2 03/04/2023 1332   RBC 4.26 03/04/2023 1332   HGB 9.5 (L) 03/04/2023 1332   HGB 11.7 (L) 12/05/2021 0956   HCT 30.7 (L) 03/04/2023 1332   PLT 385 03/04/2023 1332   PLT 274 12/05/2021 0956   MCV 72.1 (L) 03/04/2023 1332   MCH 22.3 (L) 03/04/2023 1332   MCHC 30.9 03/04/2023 1332   RDW 16.3 (H) 03/04/2023 1332   LYMPHSABS 2.3 03/04/2023 1332   MONOABS 0.4 03/04/2023 1332   EOSABS 0.2 03/04/2023 1332   BASOSABS 0.0 03/04/2023 1332    CMP     Component Value Date/Time   NA 140 03/04/2023 1332   K 4.5 03/04/2023 1332   CL 106 03/04/2023 1332   CO2 26 03/04/2023 1332   GLUCOSE 128 (H) 03/04/2023 1332   BUN 19 03/04/2023 1332   CREATININE 1.06 03/04/2023 1332    CREATININE 0.93 12/05/2021 0956   CALCIUM 9.5 03/04/2023 1332   PROT 7.2 03/04/2023 1332   ALBUMIN 3.8 03/04/2023 1332   AST 8 (L) 03/04/2023 1332   AST 11 (L) 12/05/2021 0956   ALT 7 03/04/2023 1332   ALT 14 12/05/2021 0956   ALKPHOS 73 03/04/2023 1332  BILITOT 0.4 03/04/2023 1332   BILITOT 0.5 12/05/2021 0956   GFRNONAA >60 03/04/2023 1332   GFRNONAA >60 12/05/2021 0956   GFRAA >60 04/25/2019 0255        ASSESSMENT and THERAPY PLAN:   Adenocarcinoma of rectum (HCC) This is a very pleasant 52 year old male patient with newly diagnosed T3BN0 adenocarcinoma of the rectum, MSS with no evidence of distant metastasis following up with medical oncology for consideration of concurrent chemoradiation.   He has T3N0 or stage IIa adenocarcinoma of the rectum and treatment recommendations from TB suggested concurrent chemotherapy radiation with Xeloda versus 5-fluorouracil followed by consideration for surgery and adjuvant chemotherapy   Given concern for 4 mm perirectal lymph node which is not entirely diagnostic for involvement, we have also discussed total neoadjuvant approach which patient was reluctant given some issues with his work and disability leave.  He wanted to proceed with neoadjuvant chemoradiation followed by surgery and adjuvant chemotherapy because of some logistics with his disability leave.  He started Xeloda on 04/03/2021, last date of treatment 05/10/2021.  Treatment complicated by some mild fatigue and tenesmus otherwise no major complications. He had definitive resection by Dr. Maisie Fus on July 17, 2021.  Pathology showed complete response with no residual carcinoma.  He completed 6 cycles of adjuvant Xeloda.   He is now s.p revision of ileostomy to permanent colostomy Nost recent abdominal imaging with no concern for malignancy however he was found to have an age-indeterminate blood clot in the left lower extremity.  This is his third episode of DVT.  Back in 2020 he  had a PE, he had another PE and DVT in 2022 which is what triggered the imaging to look for occult malignancy.  He has no clinical symptoms of DVT or PE today.  Since this is his third episode, have discussed about indefinite anticoagulation although he is hesitant.  We have hence discussed that if he remains cancer free at 5 years from diagnosis, will discuss about prophylactic anticoagulation.  He is fine with this plan.  At this time since he is 2 years out from the diagnosis, he wants to stretch the imaging to once a year, I have sent a message to Dr. Maisie Fus if she is fine with this plan.  He will otherwise repeat CBC, CMP, CEA as well as antiphospholipid antibody panel today.  His previous hypercoagulable workup was without any evidence of inherited disorders.    Rachel Moulds MD   All questions were answered. The patient knows to call the clinic with any problems, questions or concerns. We can certainly see the patient much sooner if necessary.   *Total Encounter Time as defined by the Centers for Medicare and Medicaid Services includes, in addition to the face-to-face time of a patient visit (documented in the note above) non-face-to-face time: obtaining and reviewing outside history, ordering and reviewing medications, tests or procedures, care coordination (communications with other health care professionals or caregivers) and documentation in the medical record.

## 2023-04-09 LAB — BETA-2-GLYCOPROTEIN I ABS, IGG/M/A
Beta-2 Glyco I IgG: <9 GPI IgG units (ref 0–20)
Beta-2-Glycoprotein I IgA: <9 GPI IgA units (ref 0–25)
Beta-2-Glycoprotein I IgM: <9 GPI IgM units (ref 0–32)

## 2023-04-09 LAB — CARDIOLIPIN ANTIBODIES, IGG, IGM, IGA
Anticardiolipin IgA: <9 APL U/mL (ref 0–11)
Anticardiolipin IgG: <9 GPL U/mL (ref 0–14)
Anticardiolipin IgM: <9 MPL U/mL (ref 0–12)

## 2023-04-10 DIAGNOSIS — C20 Malignant neoplasm of rectum: Secondary | ICD-10-CM | POA: Diagnosis not present

## 2023-04-10 DIAGNOSIS — Z932 Ileostomy status: Secondary | ICD-10-CM | POA: Diagnosis not present

## 2023-04-12 ENCOUNTER — Encounter: Payer: Self-pay | Admitting: Hematology and Oncology

## 2023-04-13 ENCOUNTER — Other Ambulatory Visit: Payer: Self-pay | Admitting: *Deleted

## 2023-04-13 LAB — LUPUS ANTICOAGULANT PANEL
DRVVT: >180 s — ABNORMAL HIGH (ref 0.0–47.0)
PTT Lupus Anticoagulant: 90.2 s — ABNORMAL HIGH (ref 0.0–43.5)

## 2023-04-13 LAB — PTT-LA MIX: PTT-LA Mix: 80.4 s — ABNORMAL HIGH (ref 0.0–40.5)

## 2023-04-13 LAB — DRVVT MIX: dRVVT Mix: 122.8 s — ABNORMAL HIGH (ref 0.0–40.4)

## 2023-04-13 LAB — DRVVT CONFIRM: dRVVT Confirm: >1.7 ratio — ABNORMAL HIGH (ref 0.8–1.2)

## 2023-04-13 LAB — HEXAGONAL PHASE PHOSPHOLIPID: Hexagonal Phase Phospholipid: 9 s (ref 0–11)

## 2023-04-13 MED ORDER — RIVAROXABAN 20 MG PO TABS: 20.0000 mg | ORAL_TABLET | Freq: Every day | ORAL | 3 refills | Status: DC

## 2023-04-22 DIAGNOSIS — H3560 Retinal hemorrhage, unspecified eye: Secondary | ICD-10-CM | POA: Diagnosis not present

## 2023-05-03 DIAGNOSIS — C20 Malignant neoplasm of rectum: Secondary | ICD-10-CM | POA: Diagnosis not present

## 2023-05-03 DIAGNOSIS — Z932 Ileostomy status: Secondary | ICD-10-CM | POA: Diagnosis not present

## 2023-05-18 DIAGNOSIS — T8189XD Other complications of procedures, not elsewhere classified, subsequent encounter: Secondary | ICD-10-CM | POA: Diagnosis not present

## 2023-05-28 DIAGNOSIS — M9901 Segmental and somatic dysfunction of cervical region: Secondary | ICD-10-CM | POA: Diagnosis not present

## 2023-05-28 DIAGNOSIS — M9903 Segmental and somatic dysfunction of lumbar region: Secondary | ICD-10-CM | POA: Diagnosis not present

## 2023-05-28 DIAGNOSIS — M9902 Segmental and somatic dysfunction of thoracic region: Secondary | ICD-10-CM | POA: Diagnosis not present

## 2023-05-28 DIAGNOSIS — M9904 Segmental and somatic dysfunction of sacral region: Secondary | ICD-10-CM | POA: Diagnosis not present

## 2023-06-03 ENCOUNTER — Inpatient Hospital Stay: Payer: BC Managed Care – PPO

## 2023-06-03 ENCOUNTER — Encounter: Payer: Self-pay | Admitting: Hematology and Oncology

## 2023-06-03 ENCOUNTER — Inpatient Hospital Stay: Payer: BC Managed Care – PPO | Attending: Hematology and Oncology | Admitting: Hematology and Oncology

## 2023-06-03 VITALS — BP 134/68 | HR 91 | Temp 98.1°F | Resp 18 | Ht 70.0 in | Wt 193.9 lb

## 2023-06-03 DIAGNOSIS — Z85048 Personal history of other malignant neoplasm of rectum, rectosigmoid junction, and anus: Secondary | ICD-10-CM | POA: Insufficient documentation

## 2023-06-03 DIAGNOSIS — C20 Malignant neoplasm of rectum: Secondary | ICD-10-CM

## 2023-06-03 DIAGNOSIS — Z86711 Personal history of pulmonary embolism: Secondary | ICD-10-CM | POA: Insufficient documentation

## 2023-06-03 DIAGNOSIS — Z86718 Personal history of other venous thrombosis and embolism: Secondary | ICD-10-CM | POA: Diagnosis not present

## 2023-06-03 DIAGNOSIS — Z7901 Long term (current) use of anticoagulants: Secondary | ICD-10-CM | POA: Diagnosis not present

## 2023-06-03 LAB — COMPREHENSIVE METABOLIC PANEL
ALT: 9 U/L (ref 0–44)
AST: 10 U/L — ABNORMAL LOW (ref 15–41)
Albumin: 4.2 g/dL (ref 3.5–5.0)
Alkaline Phosphatase: 73 U/L (ref 38–126)
Anion gap: 7 (ref 5–15)
BUN: 24 mg/dL — ABNORMAL HIGH (ref 6–20)
CO2: 27 mmol/L (ref 22–32)
Calcium: 9.8 mg/dL (ref 8.9–10.3)
Chloride: 102 mmol/L (ref 98–111)
Creatinine, Ser: 1.4 mg/dL — ABNORMAL HIGH (ref 0.61–1.24)
GFR, Estimated: >60 mL/min (ref >60)
Glucose, Bld: 280 mg/dL — ABNORMAL HIGH (ref 70–99)
Potassium: 4.7 mmol/L (ref 3.5–5.1)
Sodium: 136 mmol/L (ref 135–145)
Total Bilirubin: 0.5 mg/dL (ref 0.3–1.2)
Total Protein: 7.6 g/dL (ref 6.5–8.1)

## 2023-06-03 LAB — CBC WITH DIFFERENTIAL/PLATELET
Abs Immature Granulocytes: 0.02 10*3/uL (ref 0.00–0.07)
Basophils Absolute: 0 10*3/uL (ref 0.0–0.1)
Basophils Relative: 0 %
Eosinophils Absolute: 0.3 10*3/uL (ref 0.0–0.5)
Eosinophils Relative: 4 %
HCT: 36.7 % — ABNORMAL LOW (ref 39.0–52.0)
Hemoglobin: 11.1 g/dL — ABNORMAL LOW (ref 13.0–17.0)
Immature Granulocytes: 0 %
Lymphocytes Relative: 29 %
Lymphs Abs: 2.3 10*3/uL (ref 0.7–4.0)
MCH: 20.7 pg — ABNORMAL LOW (ref 26.0–34.0)
MCHC: 30.2 g/dL (ref 30.0–36.0)
MCV: 68.6 fL — ABNORMAL LOW (ref 80.0–100.0)
Monocytes Absolute: 0.5 10*3/uL (ref 0.1–1.0)
Monocytes Relative: 6 %
Neutro Abs: 4.9 10*3/uL (ref 1.7–7.7)
Neutrophils Relative %: 61 %
Platelets: 303 10*3/uL (ref 150–400)
RBC: 5.35 MIL/uL (ref 4.22–5.81)
RDW: 19.3 % — ABNORMAL HIGH (ref 11.5–15.5)
WBC: 8 10*3/uL (ref 4.0–10.5)
nRBC: 0 % (ref 0.0–0.2)

## 2023-06-03 NOTE — Progress Notes (Signed)
[facility] Cancer Follow up:    Noberto Retort, MD [phone] [address] [place] [address]   DIAGNOSIS:  Cancer Staging  Adenocarcinoma of rectum [facility]) Staging form: Colon and Rectum, AJCC 8th Edition - Clinical stage from 03/05/2021: Stage IIA (cT3, cN0, cM0) - Signed by Malachy Mood, MD on 07/29/2021 Stage prefix: Initial diagnosis Total positive nodes: 0 - Pathologic stage from 07/17/2021: ypT0, pN0, cM0 - Signed by Malachy Mood, MD on 07/29/2021 Stage prefix: Post-therapy Total positive nodes: 0 Residual tumor (R): R0 - None   SUMMARY OF ONCOLOGIC HISTORY:  He underwent CT AP 11/23/20 to rule out malignancy after PE. This showed asymmetric soft tissue in rectum. -colonoscopy 03/05/21 with Dr. Meridee Score revealed a palpable rectal mass on digital rectal exam, measuring 5 cm on scope. Pathology confirmed adenocarcinoma. -staging CT CAP 03/14/21 showed questionable tiny perirectal lymph nodes, but negative node on staging pelvic MRI 03/15/21. -he received neoadjuvant concurrent chemoradiation 04/03/21-05/10/21. -definitive resection on 07/17/21 under Dr. Maisie Fus. Pathology showed complete response with no residual carcinoma. Lymph nodes and margins negative. -He saw Dr. Maisie Fus on 08/19/21 for increasing postoperative rectal pain and was found to have some anastomotic dehiscence. Because of the slow healing and lack of improvement from antibiotics, there was delay in starting Xeloda and he was finally able to start Xeloda on 09/09/2021.   He completed 6 cycles of adjuvant xeloda.   CURRENT THERAPY: Observation  INTERVAL HISTORY:  Hildreth Huntoon 52 y.o. male returns for evaluation of his history of rectal cancer.   Since his last visit here, he had imaging which showed interval removal of RLQ approach drainage catheter terminating with the presacral fluid collection with resolution of gaseous component. Circumferential soft tissue thickening and edema appear similar.  Similar 13 mm right common iliac LN, likely reactive, splenomegaly. He also was found to have  Central hypoattenuation within the left common femoral vein, which may represent mixing artifact or nonocclusive thrombus. Recommend correlation with lower extremity venous Doppler ultrasound examination. He had vasc US which showed age-indeterminate DVT in the left common femoral vein, popliteal, posterior tibial and peroneal vein.  We restarted him on anticoagulation and is here for follow-up.  He denies any lower extremity swelling, chest pain or shortness of breath.   Overall he is doing pretty much the same since his last visit here.  Rest of the pertinent 10 point ROS reviewed and negative.  Patient Active Problem List   Diagnosis Date Noted   Rectal cancer (HCC) 01/11/2023   Dehiscence of anastomosis of large intestine 12/31/2022   Peripheral neuropathy due to chemotherapy (HCC) 07/07/2022   Cancer related pain 04/30/2021   Tenesmus 04/16/2021   Chemotherapy-induced nausea 04/03/2021   Adenocarcinoma of rectum (HCC) 03/21/2021   Colon cancer screening 12/26/2020   Abnormal finding on CT scan 12/26/2020   History of rectal bleeding 12/26/2020   Gastroesophageal reflux disease 12/26/2020   Diabetes mellitus due to underlying condition, uncontrolled, with hyperglycemia, without long-term current use of insulin (HCC) 04/26/2019   Obesity (BMI 30-39.9) 04/26/2019   Asthma    Anxiety    OSA (obstructive sleep apnea)    HLD (hyperlipidemia)    Bilateral pulmonary embolism (HCC) 04/24/2019    is allergic to contrast media [iodinated contrast media].  MEDICAL HISTORY: Past Medical History:  Diagnosis Date   Anemia 02/2021   iron   Anxiety    Asthma    Depression    Diabetes mellitus without complication (HCC)  History of kidney stones    HLD (hyperlipidemia)    Hypertension    Kidney stones    Neuromuscular disorder (HCC)    from radiation bi lat   OSA (obstructive sleep apnea)     CPAP   Peripheral vascular disease (HCC)    DVT   rectal ca 02/2021   Sleep apnea     SURGICAL HISTORY: Past Surgical History:  Procedure Laterality Date   ABDOMINAL SURGERY     APPENDECTOMY  1989   ag   COLONOSCOPY  03/05/2021   CYST EXCISION  2011   left neck   DIVERTING ILEOSTOMY N/A 07/17/2021   Procedure: DIVERTING ILEOSTOMY;  Surgeon: Romie Levee, MD;  Location: [facility];  Service: General;  Laterality: N/A;   FLEXIBLE SIGMOIDOSCOPY N/A 04/04/2022   Procedure: FLEXIBLE SIGMOIDOSCOPY WITH POSSIBLE DEBRIDEMENT;  Surgeon: Romie Levee, MD;  Location: [facility] [place];  Service: General;  Laterality: N/A;   RECTAL EXAM UNDER ANESTHESIA N/A 04/04/2022   Procedure: ANAL EXAM UNDER ANESTHESIA;  Surgeon: Romie Levee, MD;  Location: [facility] [place];  Service: General;  Laterality: N/A;   WISDOM TOOTH EXTRACTION     age 69   XI ROBOTIC ASSISTED LOWER ANTERIOR RESECTION N/A 07/17/2021   Procedure: XI ROBOTIC ASSISTED LOWER ANTERIOR RESECTION;  Surgeon: Romie Levee, MD;  Location: [facility];  Service: General;  Laterality: N/A;   XI ROBOTIC ASSISTED LOWER ANTERIOR RESECTION N/A 12/31/2022   Procedure: ROBOTIC ASSISTED LOWER ANTERIOR RESECTION WITH OSTOMY with intraop ICG guided perfusion, takedown of iliostomy;  Surgeon: Romie Levee, MD;  Location: [facility];  Service: General;  Laterality: N/A;    SOCIAL HISTORY: Social History   Socioeconomic History   Marital status: Married    Spouse name: Not on file   Number of children: 3   Years of education: Not on file   Highest education level: Not on file  Occupational History   Occupation: Systems analyst  Tobacco Use   Smoking status: Never   Smokeless tobacco: Never  Vaping Use   Vaping status: Never Used  Substance and Sexual Activity   Alcohol use: Yes    Comment: 1 beer every 3-4 months   Drug use: Never   Sexual activity: Yes  Other Topics Concern   Not on file  Social History  Narrative   Not on file   Social Determinants of Health   Financial Resource Strain: Not on file  Food Insecurity: No Food Insecurity (01/12/2023)   Hunger Vital Sign    Worried About Running Out of Food in the Last Year: Never true    Ran Out of Food in the Last Year: Never true  Transportation Needs: No Transportation Needs (01/12/2023)   PRAPARE - Administrator, Civil Service (Medical): No    Lack of Transportation (Non-Medical): No  Physical Activity: Not on file  Stress: Not on file  Social Connections: Not on file  Intimate Partner Violence: Not At Risk (01/12/2023)   Humiliation, Afraid, Rape, and Kick questionnaire    Fear of Current or Ex-Partner: No    Emotionally Abused: No    Physically Abused: No    Sexually Abused: No    FAMILY HISTORY: Family History  Problem Relation Age of Onset   Diabetes Mellitus II Mother    Breast cancer Mother 63   Thrombophlebitis Father    Breast cancer Maternal Grandmother 14   Diabetes Maternal Grandmother    Breast cancer Maternal Aunt 60   Diabetes  Maternal Aunt    Hypertension Paternal Grandmother    Colon cancer Neg Hx    Esophageal cancer Neg Hx    Inflammatory bowel disease Neg Hx    Liver disease Neg Hx    Pancreatic cancer Neg Hx    Rectal cancer Neg Hx    Stomach cancer Neg Hx     Review of Systems  Constitutional:  Negative for appetite change, chills, fatigue, fever and unexpected weight change.  HENT:   Negative for hearing loss, lump/mass and trouble swallowing.   Eyes:  Negative for eye problems and icterus.  Respiratory:  Negative for chest tightness, cough and shortness of breath.   Cardiovascular:  Negative for chest pain, leg swelling and palpitations.  Gastrointestinal:  Negative for abdominal distention, abdominal pain, constipation, diarrhea, nausea and vomiting.  Endocrine: Negative for hot flashes.  Genitourinary:  Negative for difficulty urinating.   Musculoskeletal:  Negative for  arthralgias.  Skin:  Negative for itching and rash.  Neurological:  Positive for numbness. Negative for dizziness, extremity weakness and headaches.  Hematological:  Negative for adenopathy. Does not bruise/bleed easily.  Psychiatric/Behavioral:  Negative for depression. The patient is not nervous/anxious.       PHYSICAL EXAMINATION  ECOG PERFORMANCE STATUS: 1 - Symptomatic but completely ambulatory  Vitals:   06/03/23 1300  BP: 134/68  Pulse: 91  Resp: 18  Temp: 98.1 F (36.7 C)  SpO2: 100%   Physical Exam Constitutional:      Appearance: Normal appearance.  Musculoskeletal:        General: No swelling.  Skin:    General: Skin is warm and dry.  Neurological:     Mental Status: He is alert.      LABORATORY DATA:  CBC    Component Value Date/Time   WBC 8.0 06/03/2023 1235   RBC 5.35 06/03/2023 1235   HGB 11.1 (L) 06/03/2023 1235   HGB 11.7 (L) 12/05/2021 0956   HCT 36.7 (L) 06/03/2023 1235   PLT 303 06/03/2023 1235   PLT 274 12/05/2021 0956   MCV 68.6 (L) 06/03/2023 1235   MCH 20.7 (L) 06/03/2023 1235   MCHC 30.2 06/03/2023 1235   RDW 19.3 (H) 06/03/2023 1235   LYMPHSABS 2.3 06/03/2023 1235   MONOABS 0.5 06/03/2023 1235   EOSABS 0.3 06/03/2023 1235   BASOSABS 0.0 06/03/2023 1235    CMP     Component Value Date/Time   NA 138 04/08/2023 1234   K 4.1 04/08/2023 1234   CL 106 04/08/2023 1234   CO2 22 04/08/2023 1234   GLUCOSE 156 (H) 04/08/2023 1234   BUN 20 04/08/2023 1234   CREATININE 1.17 04/08/2023 1234   CALCIUM 9.5 04/08/2023 1234   PROT 7.3 04/08/2023 1234   ALBUMIN 3.7 04/08/2023 1234   AST 8 (L) 04/08/2023 1234   ALT 7 04/08/2023 1234   ALKPHOS 64 04/08/2023 1234   BILITOT 0.4 04/08/2023 1234   GFRNONAA >60 04/08/2023 1234   GFRAA >60 04/25/2019 0255        ASSESSMENT and THERAPY PLAN:   Adenocarcinoma of rectum (HCC) This is a very pleasant 52 year old male patient with newly diagnosed T3BN0 adenocarcinoma of the rectum, MSS  with no evidence of distant metastasis following up with medical oncology for consideration of concurrent chemoradiation. He has T3N0 or stage IIa adenocarcinoma of the rectum and treatment recommendations from TB suggested concurrent chemotherapy radiation with Xeloda versus 5-fluorouracil followed by consideration for surgery and adjuvant chemotherapy   Given concern for 4 mm  perirectal lymph node which is not entirely diagnostic for involvement, we have also discussed total neoadjuvant approach which patient was reluctant given some issues with his work and disability leave.  He wanted to proceed with neoadjuvant chemoradiation followed by surgery and adjuvant chemotherapy because of some logistics with his disability leave.  He started Xeloda on 04/03/2021, last date of treatment 05/10/2021.  Treatment complicated by some mild fatigue and tenesmus otherwise no major complications. He had definitive resection by Dr. Maisie Fus on July 17, 2021.  Pathology showed complete response with no residual carcinoma.  He completed 6 cycles of adjuvant Xeloda.   He is now s.p revision of ileostomy to permanent colostomy Nost recent abdominal imaging with no concern for malignancy however he was found to have an age-indeterminate blood clot in the left lower extremity.  This is his third episode of DVT.  Back in 2020 he had a PE, he had another PE and DVT in 2022 which is what triggered the imaging to look for occult malignancy.  He has no clinical symptoms of DVT or PE today.  Since this is his third episode, have discussed about indefinite anticoagulation although he is hesitant.  We have hence discussed that if he remains cancer free at 5 years from diagnosis, will discuss about prophylactic anticoagulation.  He is fine with this plan.  At this time since he is 2 years out from the diagnosis, he wants to stretch the imaging to once a year,  repeat imaging in June 2025. Most recent labs with normal CEA, LA is abnormal  but he is on xarelto. Rest of the APL work up neg. We can continue anticoagulation till end of October, take a week break and repeat LA testing. He is fine with this plan. IF persistent LA, we might suggest switching to coumadin. I will call him end of October. With regards to anemia, this has improved. He will continue on oral iron once a day   Lovada Barwick MD  Total time: 20 min All questions were answered. The patient knows to call the clinic with any problems, questions or concerns. We can certainly see the patient much sooner if necessary.   *Total Encounter Time as defined by the Centers for Medicare and Medicaid Services includes, in addition to the face-to-face time of a patient visit (documented in the note above) non-face-to-face time: obtaining and reviewing outside history, ordering and reviewing medications, tests or procedures, care coordination (communications with other health care professionals or caregivers) and documentation in the medical record.

## 2023-06-03 NOTE — Assessment & Plan Note (Addendum)
This is a very pleasant 52 year old male patient with newly diagnosed T3BN0 adenocarcinoma of the rectum, MSS with no evidence of distant metastasis following up with medical oncology for consideration of concurrent chemoradiation. He has T3N0 or stage IIa adenocarcinoma of the rectum and treatment recommendations from TB suggested concurrent chemotherapy radiation with Xeloda versus 5-fluorouracil followed by consideration for surgery and adjuvant chemotherapy   Given concern for 4 mm perirectal lymph node which is not entirely diagnostic for involvement, we have also discussed total neoadjuvant approach which patient was reluctant given some issues with his work and disability leave.  He wanted to proceed with neoadjuvant chemoradiation followed by surgery and adjuvant chemotherapy because of some logistics with his disability leave.  He started Xeloda on 04/03/2021, last date of treatment 05/10/2021.  Treatment complicated by some mild fatigue and tenesmus otherwise no major complications. He had definitive resection by Dr. Maisie Fus on July 17, 2021.  Pathology showed complete response with no residual carcinoma.  He completed 6 cycles of adjuvant Xeloda.   He is now s.p revision of ileostomy to permanent colostomy Most recent abdominal imaging with no concern for malignancy however he was found to have an age-indeterminate blood clot in the left lower extremity.  This is his third episode of DVT.  Back in 2020 he had a PE, he had another PE and DVT in 2022 which is what triggered the imaging to look for occult malignancy.  He has no clinical symptoms of DVT or PE today.  Since this is his third episode, have discussed about indefinite anticoagulation although he is hesitant.  We have hence discussed that if he remains cancer free at 5 years from diagnosis, will discuss about prophylactic anticoagulation.  He is fine with this plan.  At this time since he is 2 years out from the diagnosis, he wants to  stretch the imaging to once a year,  repeat imaging in June 2025. Most recent labs with normal CEA, LA is abnormal but he is on xarelto. Rest of the APL work up neg. We can continue anticoagulation till end of October, take a week break and repeat LA testing. He is fine with this plan. IF persistent LA, we might suggest switching to coumadin. I will call him end of October. With regards to anemia, this has improved. He will continue on oral iron once a day   Rachel Moulds MD

## 2023-06-05 ENCOUNTER — Telehealth: Payer: Self-pay | Admitting: Medical Oncology

## 2023-06-05 NOTE — Telephone Encounter (Signed)
[facility] 18007: RANDOMIZED [place] CONTROLLED TRIAL OF BUPROPION FOR CANCER RELATED FATIGUE   Outgoing Call: [phone]  LVMOM with patient introducing myself and study. Provided patient with call back number.  Patient thanked and encouraged to call with questions.   Rexene Edison, RN, BSN, [facility] Clinical Research Nurse Lead 06/05/2023 3:05 PM

## 2023-06-08 DIAGNOSIS — Z932 Ileostomy status: Secondary | ICD-10-CM | POA: Diagnosis not present

## 2023-06-08 DIAGNOSIS — C20 Malignant neoplasm of rectum: Secondary | ICD-10-CM | POA: Diagnosis not present

## 2023-06-11 DIAGNOSIS — M9904 Segmental and somatic dysfunction of sacral region: Secondary | ICD-10-CM | POA: Diagnosis not present

## 2023-06-11 DIAGNOSIS — M9901 Segmental and somatic dysfunction of cervical region: Secondary | ICD-10-CM | POA: Diagnosis not present

## 2023-06-11 DIAGNOSIS — M9903 Segmental and somatic dysfunction of lumbar region: Secondary | ICD-10-CM | POA: Diagnosis not present

## 2023-06-11 DIAGNOSIS — M9902 Segmental and somatic dysfunction of thoracic region: Secondary | ICD-10-CM | POA: Diagnosis not present

## 2023-06-12 ENCOUNTER — Telehealth: Payer: Self-pay | Admitting: Medical Oncology

## 2023-06-12 NOTE — Telephone Encounter (Signed)
[facility] 18007: RANDOMIZED [place] CONTROLLED TRIAL OF BUPROPION FOR CANCER RELATED FATIGUE   Outgoing call: 11:42  Spoke with patient, regarding study and interest. Patient expressed interest in study and knowing more. I provided patient with a brief explanation of what the study is about and reviewed with him study fatigue screening measure. Patient reports a "4-5" on a scale of 0 (not at all fatigued) to a 10 (as fatigued as I could be). I confirmed patient's email address to send him study consent, to review for information purposes and so that he can make an informed decision.  All patient's questions answered to his satisfaction, patient thanked and encouraged to call with questions.   Rexene Edison, RN, BSN, [facility] Clinical Research Nurse Lead 06/12/2023 12:07 PM

## 2023-06-16 DIAGNOSIS — E291 Testicular hypofunction: Secondary | ICD-10-CM | POA: Diagnosis not present

## 2023-06-18 ENCOUNTER — Telehealth: Payer: Self-pay | Admitting: Medical Oncology

## 2023-06-18 NOTE — Telephone Encounter (Signed)
[facility] 18007: RANDOMIZED [place]ACE[place] CONTROLLED TRIAL OF BUPROPION FOR CANCER RELATED FATIGUE   Outgoing call: 12:40   LVMOM with patient regarding study. Following up with patient to see if he had any questions regarding the study information emailed to him last week. Patient encouraged to call back. Call back number provided.   Patient thanked. Rexene Edison, RN, BSN, [facility] Clinical Research Nurse Lead 06/18/2023 12:43 PM

## 2023-06-24 ENCOUNTER — Telehealth: Payer: Self-pay | Admitting: Medical Oncology

## 2023-06-24 NOTE — Telephone Encounter (Signed)
[facility] 18007: RANDOMIZED [place]ACE[place] CONTROLLED TRIAL OF BUPROPION FOR CANCER RELATED FATIGUE   Incoming email  Received email from patient informing me that he did not wish to participate in study due to the possibility of being randomized to the placebo arm of this study. Patient wrote that he will follow-up with his PCP to obtain a prescription for Wellbutrin.   Rexene Edison, RN, BSN, [facility] Clinical Research Nurse Lead 06/24/2023 2:59 PM

## 2023-06-25 DIAGNOSIS — M9903 Segmental and somatic dysfunction of lumbar region: Secondary | ICD-10-CM | POA: Diagnosis not present

## 2023-06-25 DIAGNOSIS — M9904 Segmental and somatic dysfunction of sacral region: Secondary | ICD-10-CM | POA: Diagnosis not present

## 2023-06-25 DIAGNOSIS — M9902 Segmental and somatic dysfunction of thoracic region: Secondary | ICD-10-CM | POA: Diagnosis not present

## 2023-06-25 DIAGNOSIS — M9901 Segmental and somatic dysfunction of cervical region: Secondary | ICD-10-CM | POA: Diagnosis not present

## 2023-07-13 ENCOUNTER — Telehealth: Payer: Self-pay | Admitting: Medical Oncology

## 2023-07-13 NOTE — Telephone Encounter (Signed)
DCP-001: Use of a Clinical Trial Screening Tool to Address Cancer Health Disparities in the [facility] Oncology Research Program [place])   Outgoing call: [phone]  [facility] with patient regarding study. Patient was approached for [id number] study and decline. Provided patient a short description of this study and informed him that I had sent him an email regarding this study. Patient informed it is a one time demographic collection and that study is voluntary and we can consent over the phone. Asked patient to call or reply to email with decision. Patient thanked and call back number provided.   Rexene Edison, RN, BSN, [facility] Clinical Research Nurse Lead 07/13/2023 3:46 PM

## 2023-07-15 DIAGNOSIS — Z932 Ileostomy status: Secondary | ICD-10-CM | POA: Diagnosis not present

## 2023-07-15 DIAGNOSIS — C20 Malignant neoplasm of rectum: Secondary | ICD-10-CM | POA: Diagnosis not present

## 2023-07-21 ENCOUNTER — Telehealth: Payer: Self-pay | Admitting: Medical Oncology

## 2023-07-21 NOTE — Telephone Encounter (Signed)
DCP-001: Use of a Clinical Trial Screening Tool to Address Cancer Health Disparities in the [facility] Research [facility] [place])   Outgoing email  Follow-up to patient regarding the above study. Sent patient an email to follow-up interest in study and if he had any questions. Informed patient have till tomorrow to enroll, should he be interested. Encouraged patient to call.  Patient thanked.   Rexene Edison, RN, BSN, [facility] Clinical Research Nurse Lead 07/21/2023 2:49 PM

## 2023-07-23 DIAGNOSIS — M9901 Segmental and somatic dysfunction of cervical region: Secondary | ICD-10-CM | POA: Diagnosis not present

## 2023-07-23 DIAGNOSIS — M9904 Segmental and somatic dysfunction of sacral region: Secondary | ICD-10-CM | POA: Diagnosis not present

## 2023-07-23 DIAGNOSIS — M9902 Segmental and somatic dysfunction of thoracic region: Secondary | ICD-10-CM | POA: Diagnosis not present

## 2023-07-23 DIAGNOSIS — M9903 Segmental and somatic dysfunction of lumbar region: Secondary | ICD-10-CM | POA: Diagnosis not present

## 2023-07-27 ENCOUNTER — Inpatient Hospital Stay: Payer: BC Managed Care – PPO | Attending: Hematology and Oncology

## 2023-07-27 DIAGNOSIS — C20 Malignant neoplasm of rectum: Secondary | ICD-10-CM

## 2023-07-27 DIAGNOSIS — Z85048 Personal history of other malignant neoplasm of rectum, rectosigmoid junction, and anus: Secondary | ICD-10-CM | POA: Diagnosis not present

## 2023-07-27 LAB — CBC WITH DIFFERENTIAL/PLATELET
Abs Immature Granulocytes: 0.02 10*3/uL (ref 0.00–0.07)
Basophils Absolute: 0 10*3/uL (ref 0.0–0.1)
Basophils Relative: 1 %
Eosinophils Absolute: 0.1 10*3/uL (ref 0.0–0.5)
Eosinophils Relative: 2 %
HCT: 35.3 % — ABNORMAL LOW (ref 39.0–52.0)
Hemoglobin: 11 g/dL — ABNORMAL LOW (ref 13.0–17.0)
Immature Granulocytes: 0 %
Lymphocytes Relative: 33 %
Lymphs Abs: 1.9 10*3/uL (ref 0.7–4.0)
MCH: 22 pg — ABNORMAL LOW (ref 26.0–34.0)
MCHC: 31.2 g/dL (ref 30.0–36.0)
MCV: 70.6 fL — ABNORMAL LOW (ref 80.0–100.0)
Monocytes Absolute: 0.3 10*3/uL (ref 0.1–1.0)
Monocytes Relative: 5 %
Neutro Abs: 3.4 10*3/uL (ref 1.7–7.7)
Neutrophils Relative %: 59 %
Platelets: 317 10*3/uL (ref 150–400)
RBC: 5 MIL/uL (ref 4.22–5.81)
RDW: 18.8 % — ABNORMAL HIGH (ref 11.5–15.5)
WBC: 5.7 10*3/uL (ref 4.0–10.5)
nRBC: 0 % (ref 0.0–0.2)

## 2023-07-27 LAB — COMPREHENSIVE METABOLIC PANEL
ALT: 12 U/L (ref 0–44)
AST: 12 U/L — ABNORMAL LOW (ref 15–41)
Albumin: 4 g/dL (ref 3.5–5.0)
Alkaline Phosphatase: 65 U/L (ref 38–126)
Anion gap: 7 (ref 5–15)
BUN: 18 mg/dL (ref 6–20)
CO2: 24 mmol/L (ref 22–32)
Calcium: 9.4 mg/dL (ref 8.9–10.3)
Chloride: 103 mmol/L (ref 98–111)
Creatinine, Ser: 1.1 mg/dL (ref 0.61–1.24)
GFR, Estimated: >60 mL/min (ref >60)
Glucose, Bld: 345 mg/dL — ABNORMAL HIGH (ref 70–99)
Potassium: 4.1 mmol/L (ref 3.5–5.1)
Sodium: 134 mmol/L — ABNORMAL LOW (ref 135–145)
Total Bilirubin: 0.4 mg/dL (ref 0.3–1.2)
Total Protein: 7 g/dL (ref 6.5–8.1)

## 2023-07-31 ENCOUNTER — Other Ambulatory Visit: Payer: Self-pay | Admitting: *Deleted

## 2023-07-31 ENCOUNTER — Telehealth: Payer: Self-pay | Admitting: *Deleted

## 2023-07-31 NOTE — Telephone Encounter (Signed)
-----   Message from Nurse Vikki Ports D sent at 07/28/2023 12:01 PM EDT -----  ----- Message ----- From: Billey Co, RN Sent: 07/28/2023  12:00 PM EDT To: Chanetta Marshall Bc 2   ----- Message ----- From: Stefan Church, RN Sent: 07/27/2023   5:10 PM EDT To: Billey Co, RN   ----- Message ----- From: Rachel Moulds, MD Sent: 07/27/2023   2:29 PM EDT To: Stefan Church, RN  Blood sugar appears to be poorly controlled

## 2023-07-31 NOTE — Telephone Encounter (Signed)
This RN spoke with pt per MD request and noted elevated glucose - with Steven [name] stating he is working with his primary care on this issue.  He asked about " are the coagulation studies back ?" He states per prior visit in July discussed rechecking after holding the xeralto to see if he needed to stay on anti coag medications.  He has not restarted the xeralto.  Noted no coag orders in chart- pt had standing orders for cbc and cmet.  Discussed above with pt labs not done with apology - and scheduled pt to come in Monday at 2 pm for redraw.  This note will be sent to MD for review and to enter appropriate orders.  This RN also called lab to request appt charges for 10/28 to be "credited" - will need to call back next week when supervisor in office to request above.

## 2023-08-03 ENCOUNTER — Other Ambulatory Visit: Payer: Self-pay | Admitting: Hematology and Oncology

## 2023-08-03 ENCOUNTER — Inpatient Hospital Stay: Payer: BC Managed Care – PPO | Attending: Hematology and Oncology

## 2023-08-03 DIAGNOSIS — Z86718 Personal history of other venous thrombosis and embolism: Secondary | ICD-10-CM | POA: Diagnosis not present

## 2023-08-03 DIAGNOSIS — Z86711 Personal history of pulmonary embolism: Secondary | ICD-10-CM | POA: Diagnosis not present

## 2023-08-03 DIAGNOSIS — I2699 Other pulmonary embolism without acute cor pulmonale: Secondary | ICD-10-CM

## 2023-08-03 DIAGNOSIS — Z85048 Personal history of other malignant neoplasm of rectum, rectosigmoid junction, and anus: Secondary | ICD-10-CM | POA: Diagnosis not present

## 2023-08-03 DIAGNOSIS — Z7901 Long term (current) use of anticoagulants: Secondary | ICD-10-CM | POA: Diagnosis not present

## 2023-08-03 DIAGNOSIS — C20 Malignant neoplasm of rectum: Secondary | ICD-10-CM

## 2023-08-03 LAB — CBC WITH DIFFERENTIAL/PLATELET
Abs Immature Granulocytes: 0.01 10*3/uL (ref 0.00–0.07)
Basophils Absolute: 0 10*3/uL (ref 0.0–0.1)
Basophils Relative: 0 %
Eosinophils Absolute: 0.1 10*3/uL (ref 0.0–0.5)
Eosinophils Relative: 2 %
HCT: 37.2 % — ABNORMAL LOW (ref 39.0–52.0)
Hemoglobin: 11.3 g/dL — ABNORMAL LOW (ref 13.0–17.0)
Immature Granulocytes: 0 %
Lymphocytes Relative: 31 %
Lymphs Abs: 1.7 10*3/uL (ref 0.7–4.0)
MCH: 21.7 pg — ABNORMAL LOW (ref 26.0–34.0)
MCHC: 30.4 g/dL (ref 30.0–36.0)
MCV: 71.4 fL — ABNORMAL LOW (ref 80.0–100.0)
Monocytes Absolute: 0.4 10*3/uL (ref 0.1–1.0)
Monocytes Relative: 6 %
Neutro Abs: 3.5 10*3/uL (ref 1.7–7.7)
Neutrophils Relative %: 61 %
Platelets: 322 10*3/uL (ref 150–400)
RBC: 5.21 MIL/uL (ref 4.22–5.81)
RDW: 17.9 % — ABNORMAL HIGH (ref 11.5–15.5)
WBC: 5.6 10*3/uL (ref 4.0–10.5)
nRBC: 0 % (ref 0.0–0.2)

## 2023-08-03 LAB — COMPREHENSIVE METABOLIC PANEL
ALT: 9 U/L (ref 0–44)
AST: 9 U/L — ABNORMAL LOW (ref 15–41)
Albumin: 4.2 g/dL (ref 3.5–5.0)
Alkaline Phosphatase: 66 U/L (ref 38–126)
Anion gap: 8 (ref 5–15)
BUN: 17 mg/dL (ref 6–20)
CO2: 26 mmol/L (ref 22–32)
Calcium: 9.8 mg/dL (ref 8.9–10.3)
Chloride: 101 mmol/L (ref 98–111)
Creatinine, Ser: 1.15 mg/dL (ref 0.61–1.24)
GFR, Estimated: >60 mL/min (ref >60)
Glucose, Bld: 241 mg/dL — ABNORMAL HIGH (ref 70–99)
Potassium: 4.1 mmol/L (ref 3.5–5.1)
Sodium: 135 mmol/L (ref 135–145)
Total Bilirubin: 0.5 mg/dL (ref <1.2)
Total Protein: 7.9 g/dL (ref 6.5–8.1)

## 2023-08-03 NOTE — Progress Notes (Signed)
Coag labs ordered for today.

## 2023-08-04 LAB — LUPUS ANTICOAGULANT PANEL
DRVVT: 40.4 s (ref 0.0–47.0)
PTT Lupus Anticoagulant: 44.9 s — ABNORMAL HIGH (ref 0.0–43.5)

## 2023-08-04 LAB — BETA-2-GLYCOPROTEIN I ABS, IGG/M/A
Beta-2 Glyco I IgG: <9 GPI IgG units (ref 0–20)
Beta-2-Glycoprotein I IgA: <9 GPI IgA units (ref 0–25)
Beta-2-Glycoprotein I IgM: <9 GPI IgM units (ref 0–32)

## 2023-08-04 LAB — CARDIOLIPIN ANTIBODIES, IGG, IGM, IGA
Anticardiolipin IgA: <9 U/mL (ref 0–11)
Anticardiolipin IgG: <9 [GPL'U]/mL (ref 0–14)
Anticardiolipin IgM: <9 [MPL'U]/mL (ref 0–12)

## 2023-08-04 LAB — PTT-LA MIX: PTT-LA Mix: 44.1 s — ABNORMAL HIGH (ref 0.0–40.5)

## 2023-08-04 LAB — HEXAGONAL PHASE PHOSPHOLIPID: Hexagonal Phase Phospholipid: 6 s (ref 0–11)

## 2023-08-06 ENCOUNTER — Telehealth: Payer: Self-pay | Admitting: *Deleted

## 2023-08-07 NOTE — Telephone Encounter (Signed)
This RN spoke with pt per MD review of coag labs and informed pt no need to change to warfarin but continue on the xeralto.  Pt verbalized understanding as well as tele appt canceled per this conversation- pt is scheduled for his routine followup in Jan 2025.

## 2023-08-10 ENCOUNTER — Telehealth: Payer: BC Managed Care – PPO | Admitting: Hematology and Oncology

## 2023-08-11 ENCOUNTER — Inpatient Hospital Stay: Payer: BC Managed Care – PPO | Admitting: Hematology and Oncology

## 2023-08-11 DIAGNOSIS — M9902 Segmental and somatic dysfunction of thoracic region: Secondary | ICD-10-CM | POA: Diagnosis not present

## 2023-08-11 DIAGNOSIS — M9903 Segmental and somatic dysfunction of lumbar region: Secondary | ICD-10-CM | POA: Diagnosis not present

## 2023-08-11 DIAGNOSIS — M9901 Segmental and somatic dysfunction of cervical region: Secondary | ICD-10-CM | POA: Diagnosis not present

## 2023-08-11 DIAGNOSIS — M9904 Segmental and somatic dysfunction of sacral region: Secondary | ICD-10-CM | POA: Diagnosis not present

## 2023-08-14 DIAGNOSIS — Z932 Ileostomy status: Secondary | ICD-10-CM | POA: Diagnosis not present

## 2023-08-14 DIAGNOSIS — C20 Malignant neoplasm of rectum: Secondary | ICD-10-CM | POA: Diagnosis not present

## 2023-08-18 DIAGNOSIS — T8189XD Other complications of procedures, not elsewhere classified, subsequent encounter: Secondary | ICD-10-CM | POA: Diagnosis not present

## 2023-08-25 DIAGNOSIS — M9901 Segmental and somatic dysfunction of cervical region: Secondary | ICD-10-CM | POA: Diagnosis not present

## 2023-08-25 DIAGNOSIS — M9902 Segmental and somatic dysfunction of thoracic region: Secondary | ICD-10-CM | POA: Diagnosis not present

## 2023-08-25 DIAGNOSIS — M9904 Segmental and somatic dysfunction of sacral region: Secondary | ICD-10-CM | POA: Diagnosis not present

## 2023-08-25 DIAGNOSIS — M9903 Segmental and somatic dysfunction of lumbar region: Secondary | ICD-10-CM | POA: Diagnosis not present

## 2023-09-08 DIAGNOSIS — M9901 Segmental and somatic dysfunction of cervical region: Secondary | ICD-10-CM | POA: Diagnosis not present

## 2023-09-08 DIAGNOSIS — M9903 Segmental and somatic dysfunction of lumbar region: Secondary | ICD-10-CM | POA: Diagnosis not present

## 2023-09-08 DIAGNOSIS — M9902 Segmental and somatic dysfunction of thoracic region: Secondary | ICD-10-CM | POA: Diagnosis not present

## 2023-09-08 DIAGNOSIS — M9904 Segmental and somatic dysfunction of sacral region: Secondary | ICD-10-CM | POA: Diagnosis not present

## 2023-09-11 DIAGNOSIS — C20 Malignant neoplasm of rectum: Secondary | ICD-10-CM | POA: Diagnosis not present

## 2023-09-11 DIAGNOSIS — Z932 Ileostomy status: Secondary | ICD-10-CM | POA: Diagnosis not present

## 2023-09-17 DIAGNOSIS — M9904 Segmental and somatic dysfunction of sacral region: Secondary | ICD-10-CM | POA: Diagnosis not present

## 2023-09-17 DIAGNOSIS — M9903 Segmental and somatic dysfunction of lumbar region: Secondary | ICD-10-CM | POA: Diagnosis not present

## 2023-09-17 DIAGNOSIS — M9901 Segmental and somatic dysfunction of cervical region: Secondary | ICD-10-CM | POA: Diagnosis not present

## 2023-09-17 DIAGNOSIS — M9902 Segmental and somatic dysfunction of thoracic region: Secondary | ICD-10-CM | POA: Diagnosis not present

## 2023-10-08 ENCOUNTER — Encounter: Payer: Self-pay | Admitting: Hematology and Oncology

## 2023-10-09 ENCOUNTER — Inpatient Hospital Stay: Payer: BC Managed Care – PPO | Attending: Hematology and Oncology | Admitting: Hematology and Oncology

## 2023-10-09 DIAGNOSIS — Z9221 Personal history of antineoplastic chemotherapy: Secondary | ICD-10-CM | POA: Insufficient documentation

## 2023-10-09 DIAGNOSIS — Z923 Personal history of irradiation: Secondary | ICD-10-CM | POA: Insufficient documentation

## 2023-10-09 DIAGNOSIS — Z7901 Long term (current) use of anticoagulants: Secondary | ICD-10-CM | POA: Insufficient documentation

## 2023-10-09 DIAGNOSIS — Z86718 Personal history of other venous thrombosis and embolism: Secondary | ICD-10-CM | POA: Insufficient documentation

## 2023-10-09 DIAGNOSIS — Z85048 Personal history of other malignant neoplasm of rectum, rectosigmoid junction, and anus: Secondary | ICD-10-CM | POA: Insufficient documentation

## 2023-10-09 DIAGNOSIS — Z86711 Personal history of pulmonary embolism: Secondary | ICD-10-CM | POA: Insufficient documentation

## 2023-10-15 DIAGNOSIS — E7800 Pure hypercholesterolemia, unspecified: Secondary | ICD-10-CM | POA: Diagnosis not present

## 2023-10-15 DIAGNOSIS — E1165 Type 2 diabetes mellitus with hyperglycemia: Secondary | ICD-10-CM | POA: Diagnosis not present

## 2023-10-15 DIAGNOSIS — G4733 Obstructive sleep apnea (adult) (pediatric): Secondary | ICD-10-CM | POA: Diagnosis not present

## 2023-10-15 DIAGNOSIS — I10 Essential (primary) hypertension: Secondary | ICD-10-CM | POA: Diagnosis not present

## 2023-10-27 ENCOUNTER — Inpatient Hospital Stay: Payer: BC Managed Care – PPO

## 2023-10-27 ENCOUNTER — Inpatient Hospital Stay: Payer: BC Managed Care – PPO | Admitting: Hematology and Oncology

## 2023-10-27 ENCOUNTER — Other Ambulatory Visit: Payer: Self-pay | Admitting: *Deleted

## 2023-10-27 DIAGNOSIS — Z9221 Personal history of antineoplastic chemotherapy: Secondary | ICD-10-CM | POA: Diagnosis not present

## 2023-10-27 DIAGNOSIS — C20 Malignant neoplasm of rectum: Secondary | ICD-10-CM

## 2023-10-27 DIAGNOSIS — Z86711 Personal history of pulmonary embolism: Secondary | ICD-10-CM | POA: Diagnosis not present

## 2023-10-27 DIAGNOSIS — E0865 Diabetes mellitus due to underlying condition with hyperglycemia: Secondary | ICD-10-CM

## 2023-10-27 DIAGNOSIS — Z85048 Personal history of other malignant neoplasm of rectum, rectosigmoid junction, and anus: Secondary | ICD-10-CM | POA: Diagnosis not present

## 2023-10-27 DIAGNOSIS — Z923 Personal history of irradiation: Secondary | ICD-10-CM | POA: Diagnosis not present

## 2023-10-27 DIAGNOSIS — Z86718 Personal history of other venous thrombosis and embolism: Secondary | ICD-10-CM | POA: Diagnosis not present

## 2023-10-27 DIAGNOSIS — Z7901 Long term (current) use of anticoagulants: Secondary | ICD-10-CM | POA: Diagnosis not present

## 2023-10-27 LAB — CMP (CANCER CENTER ONLY)
ALT: 11 U/L (ref 0–44)
AST: 11 U/L — ABNORMAL LOW (ref 15–41)
Albumin: 4.1 g/dL (ref 3.5–5.0)
Alkaline Phosphatase: 62 U/L (ref 38–126)
Anion gap: 6 (ref 5–15)
BUN: 19 mg/dL (ref 6–20)
CO2: 25 mmol/L (ref 22–32)
Calcium: 9.3 mg/dL (ref 8.9–10.3)
Chloride: 106 mmol/L (ref 98–111)
Creatinine: 1.09 mg/dL (ref 0.61–1.24)
GFR, Estimated: >60 mL/min (ref >60)
Glucose, Bld: 174 mg/dL — ABNORMAL HIGH (ref 70–99)
Potassium: 4.6 mmol/L (ref 3.5–5.1)
Sodium: 137 mmol/L (ref 135–145)
Total Bilirubin: 0.5 mg/dL (ref 0.0–1.2)
Total Protein: 7.4 g/dL (ref 6.5–8.1)

## 2023-10-27 LAB — CBC WITH DIFFERENTIAL/PLATELET
Abs Immature Granulocytes: 0.01 10*3/uL (ref 0.00–0.07)
Basophils Absolute: 0 10*3/uL (ref 0.0–0.1)
Basophils Relative: 0 %
Eosinophils Absolute: 0.2 10*3/uL (ref 0.0–0.5)
Eosinophils Relative: 3 %
HCT: 37.7 % — ABNORMAL LOW (ref 39.0–52.0)
Hemoglobin: 11.2 g/dL — ABNORMAL LOW (ref 13.0–17.0)
Immature Granulocytes: 0 %
Lymphocytes Relative: 29 %
Lymphs Abs: 2 10*3/uL (ref 0.7–4.0)
MCH: 20.5 pg — ABNORMAL LOW (ref 26.0–34.0)
MCHC: 29.7 g/dL — ABNORMAL LOW (ref 30.0–36.0)
MCV: 68.9 fL — ABNORMAL LOW (ref 80.0–100.0)
Monocytes Absolute: 0.5 10*3/uL (ref 0.1–1.0)
Monocytes Relative: 7 %
Neutro Abs: 4.2 10*3/uL (ref 1.7–7.7)
Neutrophils Relative %: 61 %
Platelets: 293 10*3/uL (ref 150–400)
RBC: 5.47 MIL/uL (ref 4.22–5.81)
RDW: 17.4 % — ABNORMAL HIGH (ref 11.5–15.5)
WBC: 6.9 10*3/uL (ref 4.0–10.5)
nRBC: 0 % (ref 0.0–0.2)

## 2023-10-27 LAB — CEA (ACCESS): CEA (CHCC): 1.56 ng/mL (ref 0.00–5.00)

## 2023-10-27 NOTE — Progress Notes (Signed)
[facility] Cancer Follow up:    Noberto Retort, MD [phone] [address] [place] [address]   DIAGNOSIS:  Cancer Staging  Adenocarcinoma of rectum [facility]) Staging form: Colon and Rectum, AJCC 8th Edition - Clinical stage from 03/05/2021: Stage IIA (cT3, cN0, cM0) - Signed by Malachy Mood, MD on 07/29/2021 Stage prefix: Initial diagnosis Total positive nodes: 0 - Pathologic stage from 07/17/2021: ypT0, pN0, cM0 - Signed by Malachy Mood, MD on 07/29/2021 Stage prefix: Post-therapy Total positive nodes: 0 Residual tumor (R): R0 - None   SUMMARY OF ONCOLOGIC HISTORY:  He underwent CT AP 11/23/20 to rule out malignancy after PE. This showed asymmetric soft tissue in rectum. -colonoscopy 03/05/21 with Dr. Meridee Score revealed a palpable rectal mass on digital rectal exam, measuring 5 cm on scope. Pathology confirmed adenocarcinoma. -staging CT CAP 03/14/21 showed questionable tiny perirectal lymph nodes, but negative node on staging pelvic MRI 03/15/21. -he received neoadjuvant concurrent chemoradiation 04/03/21-05/10/21. -definitive resection on 07/17/21 under Dr. Maisie Fus. Pathology showed complete response with no residual carcinoma. Lymph nodes and margins negative. -He saw Dr. Maisie Fus on 08/19/21 for increasing postoperative rectal pain and was found to have some anastomotic dehiscence. Because of the slow healing and lack of improvement from antibiotics, there was delay in starting Xeloda and he was finally able to start Xeloda on 09/09/2021.   He completed 6 cycles of adjuvant xeloda.   CURRENT THERAPY: Observation  INTERVAL HISTORY:  Steven [name] 53 y.o. male returns for evaluation of his history of rectal cancer.    Discussed the use of AI scribe software for clinical note transcription with the patient, who gave verbal consent to proceed.  History of Present Illness    Since his last visit here, no major change in health. He is compliant with Xarelto. No changes in  breathing, new cough, headaches, double vision, falls, or swelling.  He experiences recurrent abscesses approximately every two to three years at various locations on his body, including the left side of his navel, ear, and groin. The most recent abscess occurred around Christmas time on the left side of his navel, which drained spontaneously two days after Christmas. No known history of MRSA or staph infections.  He experiences intermittent pruritus on his back and ribs, which he describes as 'itching like crazy' without a visible rash. He has been using Benadryl topical and Gold Bond cream, which provide temporary relief.   He mentions a history of contrast allergies, requiring premedication with Benadryl and prednisone before scans. He is due for a scan in June.  Rest of the pertinent 10 point ROS reviewed and negative.  Patient Active Problem List   Diagnosis Date Noted   Rectal cancer (HCC) 01/11/2023   Dehiscence of anastomosis of large intestine 12/31/2022   Peripheral neuropathy due to chemotherapy (HCC) 07/07/2022   Cancer related pain 04/30/2021   Tenesmus 04/16/2021   Chemotherapy-induced nausea 04/03/2021   Adenocarcinoma of rectum (HCC) 03/21/2021   Colon cancer screening 12/26/2020   Abnormal finding on CT scan 12/26/2020   History of rectal bleeding 12/26/2020   Gastroesophageal reflux disease 12/26/2020   Diabetes mellitus due to underlying condition, uncontrolled, with hyperglycemia, without long-term current use of insulin (HCC) 04/26/2019   Obesity (BMI 30-39.9) 04/26/2019   Asthma    Anxiety    OSA (obstructive sleep apnea)    HLD (hyperlipidemia)    Bilateral pulmonary embolism (HCC) 04/24/2019    is allergic to contrast media [iodinated contrast media].  MEDICAL HISTORY: Past Medical History:  Diagnosis Date   Anemia 02/2021   iron   Anxiety    Asthma    Depression    Diabetes mellitus without complication (HCC)    History of kidney stones    HLD  (hyperlipidemia)    Hypertension    Kidney stones    Neuromuscular disorder (HCC)    from radiation bi lat   OSA (obstructive sleep apnea)    CPAP   Peripheral vascular disease (HCC)    DVT   rectal ca 02/2021   Sleep apnea     SURGICAL HISTORY: Past Surgical History:  Procedure Laterality Date   ABDOMINAL SURGERY     APPENDECTOMY  1989   ag   COLONOSCOPY  03/05/2021   CYST EXCISION  2011   left neck   DIVERTING ILEOSTOMY N/A 07/17/2021   Procedure: DIVERTING ILEOSTOMY;  Surgeon: Romie Levee, MD;  Location: [facility];  Service: General;  Laterality: N/A;   FLEXIBLE SIGMOIDOSCOPY N/A 04/04/2022   Procedure: FLEXIBLE SIGMOIDOSCOPY WITH POSSIBLE DEBRIDEMENT;  Surgeon: Romie Levee, MD;  Location: [facility];  Service: General;  Laterality: N/A;   RECTAL EXAM UNDER ANESTHESIA N/A 04/04/2022   Procedure: ANAL EXAM UNDER ANESTHESIA;  Surgeon: Romie Levee, MD;  Location: [facility];  Service: General;  Laterality: N/A;   WISDOM TOOTH EXTRACTION     age 22   XI ROBOTIC ASSISTED LOWER ANTERIOR RESECTION N/A 07/17/2021   Procedure: XI ROBOTIC ASSISTED LOWER ANTERIOR RESECTION;  Surgeon: Romie Levee, MD;  Location: [facility];  Service: General;  Laterality: N/A;   XI ROBOTIC ASSISTED LOWER ANTERIOR RESECTION N/A 12/31/2022   Procedure: ROBOTIC ASSISTED LOWER ANTERIOR RESECTION WITH OSTOMY with intraop ICG guided perfusion, takedown of iliostomy;  Surgeon: Romie Levee, MD;  Location: [facility];  Service: General;  Laterality: N/A;    SOCIAL HISTORY: Social History   Socioeconomic History   Marital status: Married    Spouse name: Not on file   Number of children: 3   Years of education: Not on file   Highest education level: Not on file  Occupational History   Occupation: Systems analyst  Tobacco Use   Smoking status: Never   Smokeless tobacco: Never  Vaping Use   Vaping status: Never Used  Substance and Sexual Activity   Alcohol use:  Yes    Comment: 1 beer every 3-4 months   Drug use: Never   Sexual activity: Yes  Other Topics Concern   Not on file  Social History Narrative   Not on file   Social Drivers of Health   Financial Resource Strain: Not on file  Food Insecurity: No Food Insecurity (01/12/2023)   Hunger Vital Sign    Worried About Running Out of Food in the Last Year: Never true    Ran Out of Food in the Last Year: Never true  Transportation Needs: No Transportation Needs (01/12/2023)   PRAPARE - Administrator, Civil Service (Medical): No    Lack of Transportation (Non-Medical): No  Physical Activity: Not on file  Stress: Not on file  Social Connections: Not on file  Intimate Partner Violence: Not At Risk (01/12/2023)   Humiliation, Afraid, Rape, and Kick questionnaire    Fear of Current or Ex-Partner: No    Emotionally Abused: No    Physically Abused: No    Sexually Abused: No    FAMILY HISTORY: Family History  Problem Relation Age of Onset   Diabetes Mellitus  II Mother    Breast cancer Mother 4   Thrombophlebitis Father    Breast cancer Maternal Grandmother 52   Diabetes Maternal Grandmother    Breast cancer Maternal Aunt 60   Diabetes Maternal Aunt    Hypertension Paternal Grandmother    Colon cancer Neg Hx    Esophageal cancer Neg Hx    Inflammatory bowel disease Neg Hx    Liver disease Neg Hx    Pancreatic cancer Neg Hx    Rectal cancer Neg Hx    Stomach cancer Neg Hx     Review of Systems  Constitutional:  Negative for appetite change, chills, fatigue, fever and unexpected weight change.  HENT:   Negative for hearing loss, lump/mass and trouble swallowing.   Eyes:  Negative for eye problems and icterus.  Respiratory:  Negative for chest tightness, cough and shortness of breath.   Cardiovascular:  Negative for chest pain, leg swelling and palpitations.  Gastrointestinal:  Negative for abdominal distention, abdominal pain, constipation, diarrhea, nausea and vomiting.   Endocrine: Negative for hot flashes.  Genitourinary:  Negative for difficulty urinating.   Musculoskeletal:  Negative for arthralgias.  Skin:  Negative for itching and rash.  Neurological:  Positive for numbness. Negative for dizziness, extremity weakness and headaches.  Hematological:  Negative for adenopathy. Does not bruise/bleed easily.  Psychiatric/Behavioral:  Negative for depression. The patient is not nervous/anxious.       PHYSICAL EXAMINATION  ECOG PERFORMANCE STATUS: 1 - Symptomatic but completely ambulatory  Vitals:   10/27/23 0942  BP: 132/80  Pulse: 90  Resp: 18  Temp: 98 F (36.7 C)  SpO2: 99%   Physical Exam Constitutional:      Appearance: Normal appearance.  Cardiovascular:     Rate and Rhythm: Normal rate and regular rhythm.     Pulses: Normal pulses.     Heart sounds: Normal heart sounds.  Pulmonary:     Effort: Pulmonary effort is normal.     Breath sounds: Normal breath sounds.  Musculoskeletal:        General: No swelling.     Cervical back: Normal range of motion and neck supple. No rigidity.  Lymphadenopathy:     Cervical: No cervical adenopathy.  Skin:    General: Skin is warm and dry.  Neurological:     General: No focal deficit present.     Mental Status: He is alert.  Psychiatric:        Mood and Affect: Mood normal.      LABORATORY DATA:  CBC    Component Value Date/Time   WBC 6.9 10/27/2023 1014   RBC 5.47 10/27/2023 1014   HGB 11.2 (L) 10/27/2023 1014   HGB 11.7 (L) 12/05/2021 0956   HCT 37.7 (L) 10/27/2023 1014   PLT 293 10/27/2023 1014   PLT 274 12/05/2021 0956   MCV 68.9 (L) 10/27/2023 1014   MCH 20.5 (L) 10/27/2023 1014   MCHC 29.7 (L) 10/27/2023 1014   RDW 17.4 (H) 10/27/2023 1014   LYMPHSABS 2.0 10/27/2023 1014   MONOABS 0.5 10/27/2023 1014   EOSABS 0.2 10/27/2023 1014   BASOSABS 0.0 10/27/2023 1014    CMP     Component Value Date/Time   NA 137 10/27/2023 1014   K 4.6 10/27/2023 1014   CL 106  10/27/2023 1014   CO2 25 10/27/2023 1014   GLUCOSE 174 (H) 10/27/2023 1014   BUN 19 10/27/2023 1014   CREATININE 1.09 10/27/2023 1014   CALCIUM 9.3 10/27/2023 1014  PROT 7.4 10/27/2023 1014   ALBUMIN 4.1 10/27/2023 1014   AST 11 (L) 10/27/2023 1014   ALT 11 10/27/2023 1014   ALKPHOS 62 10/27/2023 1014   BILITOT 0.5 10/27/2023 1014   GFRNONAA >60 10/27/2023 1014   GFRAA >60 04/25/2019 0255        ASSESSMENT and THERAPY PLAN:   Adenocarcinoma of rectum (HCC) This is a very pleasant 53 year old male patient with newly diagnosed T3BN0 adenocarcinoma of the rectum, MSS with no evidence of distant metastasis s/p neoadjuvant chemoradiation followed by surgery and adjuvant chemotherapy because of some logistics with his disability leave.   He had definitive resection by Dr. Maisie Fus on July 17, 2021.  Pathology showed complete response with no residual carcinoma.  He completed 6 cycles of adjuvant Xeloda. He now has permanent colostomy.  Colorectal Cancer Stable with no new symptoms. Follow-up scans scheduled for June 2025. -Continue current management. -Order tumor markers with today's labs.  Venous Thromboembolism No new symptoms. Currently on Xarelto. -Continue Xarelto. -He had 2 def episodes of VTE, questionable third one, hence long term anticoagulation is recommended.  Abdominal Hernia No pain or changes in size. Discussed with surgeon and decision made to monitor unless symptoms develop. -Continue current management.  Skin Rash Itching and mild rash on the back. Likely due to dry skin. -Continue with topical Benadryl and Gold Bond cream.  Colonoscopy Last colonoscopy was incomplete per patient.  -Follow up with Dr. Meridee Score after November 05, 2023 to schedule colonoscopy.   Rachel Moulds MD  Total time: 30 min All questions were answered. The patient knows to call the clinic with any problems, questions or concerns. We can certainly see the patient much sooner  if necessary.   *Total Encounter Time as defined by the Centers for Medicare and Medicaid Services includes, in addition to the face-to-face time of a patient visit (documented in the note above) non-face-to-face time: obtaining and reviewing outside history, ordering and reviewing medications, tests or procedures, care coordination (communications with other health care professionals or caregivers) and documentation in the medical record.

## 2023-10-27 NOTE — Assessment & Plan Note (Addendum)
This is a very pleasant 53 year old male patient with newly diagnosed T3BN0 adenocarcinoma of the rectum, MSS with no evidence of distant metastasis s/p neoadjuvant chemoradiation followed by surgery and adjuvant chemotherapy because of some logistics with his disability leave.   He had definitive resection by Dr. Maisie Fus on July 17, 2021.  Pathology showed complete response with no residual carcinoma.  He completed 6 cycles of adjuvant Xeloda. He now has permanent colostomy.  Colorectal Cancer Stable with no new symptoms. Follow-up scans scheduled for June 2025. -Continue current management. -Order tumor markers with today's labs.  Venous Thromboembolism No new symptoms. Currently on Xarelto. -Continue Xarelto. -He had 2 def episodes of VTE, questionable third one, hence long term anticoagulation is recommended.  Abdominal Hernia No pain or changes in size. Discussed with surgeon and decision made to monitor unless symptoms develop. -Continue current management.  Skin Rash Itching and mild rash on the back. Likely due to dry skin. -Continue with topical Benadryl and Gold Bond cream.  Colonoscopy Last colonoscopy was incomplete per patient.  -Follow up with Dr. Meridee Score after November 05, 2023 to schedule colonoscopy.   Rachel Moulds MD

## 2023-10-28 ENCOUNTER — Encounter: Payer: Self-pay | Admitting: *Deleted

## 2023-11-17 DIAGNOSIS — E11319 Type 2 diabetes mellitus with unspecified diabetic retinopathy without macular edema: Secondary | ICD-10-CM | POA: Diagnosis not present

## 2023-11-17 DIAGNOSIS — G4733 Obstructive sleep apnea (adult) (pediatric): Secondary | ICD-10-CM | POA: Diagnosis not present

## 2023-11-17 DIAGNOSIS — I10 Essential (primary) hypertension: Secondary | ICD-10-CM | POA: Diagnosis not present

## 2023-11-17 DIAGNOSIS — G2581 Restless legs syndrome: Secondary | ICD-10-CM | POA: Diagnosis not present

## 2023-11-21 ENCOUNTER — Encounter: Payer: Self-pay | Admitting: Hematology and Oncology

## 2023-11-25 ENCOUNTER — Other Ambulatory Visit: Payer: Self-pay | Admitting: Hematology and Oncology

## 2023-11-25 ENCOUNTER — Encounter: Payer: Self-pay | Admitting: *Deleted

## 2023-11-25 ENCOUNTER — Other Ambulatory Visit: Payer: Self-pay | Admitting: *Deleted

## 2023-11-25 DIAGNOSIS — C20 Malignant neoplasm of rectum: Secondary | ICD-10-CM

## 2023-11-25 MED ORDER — PREDNISONE 50 MG PO TABS: ORAL_TABLET | ORAL | 1 refills | Status: DC

## 2023-11-26 ENCOUNTER — Telehealth: Payer: Self-pay

## 2023-11-26 NOTE — Telephone Encounter (Signed)
 AT, Thanks. Will go ahead and get him set up for colonoscopy through ostomy. Having the imaging completed before his procedure will also be helpful just to make sure we are doing the right thing and safest thing for him.   Xyla Leisner, Please let the patient know that we want to go ahead and move forward with colonoscopy. Schedule it in the hospital-based outpatient setting. Normal preparation is fine. Thanks. GM

## 2023-11-29 ENCOUNTER — Encounter (HOSPITAL_COMMUNITY): Payer: Self-pay

## 2023-11-29 ENCOUNTER — Emergency Department (HOSPITAL_COMMUNITY)

## 2023-11-29 ENCOUNTER — Inpatient Hospital Stay (HOSPITAL_COMMUNITY)
Admission: EM | Admit: 2023-11-29 | Discharge: 2023-12-09 | DRG: 907 | Disposition: A | Discharge status: Home Health | Attending: Family Medicine | Admitting: Family Medicine

## 2023-11-29 ENCOUNTER — Other Ambulatory Visit: Payer: Self-pay

## 2023-11-29 DIAGNOSIS — C20 Malignant neoplasm of rectum: Secondary | ICD-10-CM | POA: Diagnosis not present

## 2023-11-29 DIAGNOSIS — Y832 Surgical operation with anastomosis, bypass or graft as the cause of abnormal reaction of the patient, or of later complication, without mention of misadventure at the time of the procedure: Secondary | ICD-10-CM | POA: Diagnosis present

## 2023-11-29 DIAGNOSIS — Z7901 Long term (current) use of anticoagulants: Secondary | ICD-10-CM | POA: Diagnosis not present

## 2023-11-29 DIAGNOSIS — E669 Obesity, unspecified: Secondary | ICD-10-CM | POA: Diagnosis present

## 2023-11-29 DIAGNOSIS — Z933 Colostomy status: Secondary | ICD-10-CM | POA: Diagnosis not present

## 2023-11-29 DIAGNOSIS — E1165 Type 2 diabetes mellitus with hyperglycemia: Secondary | ICD-10-CM | POA: Diagnosis not present

## 2023-11-29 DIAGNOSIS — G8929 Other chronic pain: Secondary | ICD-10-CM | POA: Diagnosis present

## 2023-11-29 DIAGNOSIS — N200 Calculus of kidney: Secondary | ICD-10-CM | POA: Diagnosis not present

## 2023-11-29 DIAGNOSIS — Z833 Family history of diabetes mellitus: Secondary | ICD-10-CM

## 2023-11-29 DIAGNOSIS — K6389 Other specified diseases of intestine: Secondary | ICD-10-CM

## 2023-11-29 DIAGNOSIS — E876 Hypokalemia: Secondary | ICD-10-CM | POA: Diagnosis present

## 2023-11-29 DIAGNOSIS — Z87442 Personal history of urinary calculi: Secondary | ICD-10-CM

## 2023-11-29 DIAGNOSIS — E860 Dehydration: Secondary | ICD-10-CM | POA: Diagnosis present

## 2023-11-29 DIAGNOSIS — R000 Tachycardia, unspecified: Secondary | ICD-10-CM | POA: Diagnosis not present

## 2023-11-29 DIAGNOSIS — F32A Depression, unspecified: Secondary | ICD-10-CM | POA: Diagnosis present

## 2023-11-29 DIAGNOSIS — K611 Rectal abscess: Secondary | ICD-10-CM | POA: Diagnosis not present

## 2023-11-29 DIAGNOSIS — Z9221 Personal history of antineoplastic chemotherapy: Secondary | ICD-10-CM

## 2023-11-29 DIAGNOSIS — R140 Abdominal distension (gaseous): Secondary | ICD-10-CM | POA: Diagnosis not present

## 2023-11-29 DIAGNOSIS — R8271 Bacteriuria: Secondary | ICD-10-CM | POA: Diagnosis present

## 2023-11-29 DIAGNOSIS — K651 Peritoneal abscess: Secondary | ICD-10-CM | POA: Diagnosis not present

## 2023-11-29 DIAGNOSIS — J45909 Unspecified asthma, uncomplicated: Secondary | ICD-10-CM | POA: Diagnosis not present

## 2023-11-29 DIAGNOSIS — Z803 Family history of malignant neoplasm of breast: Secondary | ICD-10-CM

## 2023-11-29 DIAGNOSIS — R109 Unspecified abdominal pain: Secondary | ICD-10-CM | POA: Diagnosis not present

## 2023-11-29 DIAGNOSIS — F419 Anxiety disorder, unspecified: Secondary | ICD-10-CM | POA: Diagnosis present

## 2023-11-29 DIAGNOSIS — K567 Ileus, unspecified: Secondary | ICD-10-CM | POA: Diagnosis not present

## 2023-11-29 DIAGNOSIS — H903 Sensorineural hearing loss, bilateral: Secondary | ICD-10-CM | POA: Diagnosis present

## 2023-11-29 DIAGNOSIS — Z91041 Radiographic dye allergy status: Secondary | ICD-10-CM

## 2023-11-29 DIAGNOSIS — E871 Hypo-osmolality and hyponatremia: Secondary | ICD-10-CM | POA: Diagnosis not present

## 2023-11-29 DIAGNOSIS — N179 Acute kidney failure, unspecified: Secondary | ICD-10-CM | POA: Diagnosis not present

## 2023-11-29 DIAGNOSIS — I10 Essential (primary) hypertension: Secondary | ICD-10-CM | POA: Diagnosis present

## 2023-11-29 DIAGNOSIS — K219 Gastro-esophageal reflux disease without esophagitis: Secondary | ICD-10-CM | POA: Diagnosis present

## 2023-11-29 DIAGNOSIS — R1084 Generalized abdominal pain: Secondary | ICD-10-CM | POA: Diagnosis not present

## 2023-11-29 DIAGNOSIS — K631 Perforation of intestine (nontraumatic): Secondary | ICD-10-CM | POA: Diagnosis not present

## 2023-11-29 DIAGNOSIS — R188 Other ascites: Secondary | ICD-10-CM | POA: Diagnosis not present

## 2023-11-29 DIAGNOSIS — E1151 Type 2 diabetes mellitus with diabetic peripheral angiopathy without gangrene: Secondary | ICD-10-CM | POA: Diagnosis not present

## 2023-11-29 DIAGNOSIS — G4733 Obstructive sleep apnea (adult) (pediatric): Secondary | ICD-10-CM | POA: Diagnosis present

## 2023-11-29 DIAGNOSIS — E785 Hyperlipidemia, unspecified: Secondary | ICD-10-CM | POA: Diagnosis present

## 2023-11-29 DIAGNOSIS — Z923 Personal history of irradiation: Secondary | ICD-10-CM

## 2023-11-29 DIAGNOSIS — E119 Type 2 diabetes mellitus without complications: Secondary | ICD-10-CM | POA: Diagnosis not present

## 2023-11-29 DIAGNOSIS — R1030 Lower abdominal pain, unspecified: Secondary | ICD-10-CM | POA: Diagnosis not present

## 2023-11-29 DIAGNOSIS — R339 Retention of urine, unspecified: Secondary | ICD-10-CM | POA: Diagnosis not present

## 2023-11-29 DIAGNOSIS — D649 Anemia, unspecified: Secondary | ICD-10-CM | POA: Diagnosis not present

## 2023-11-29 DIAGNOSIS — Z86711 Personal history of pulmonary embolism: Secondary | ICD-10-CM

## 2023-11-29 DIAGNOSIS — T81320A Disruption or dehiscence of gastrointestinal tract anastomosis, repair, or closure, initial encounter: Secondary | ICD-10-CM | POA: Diagnosis not present

## 2023-11-29 DIAGNOSIS — R112 Nausea with vomiting, unspecified: Secondary | ICD-10-CM | POA: Diagnosis not present

## 2023-11-29 DIAGNOSIS — Z7984 Long term (current) use of oral hypoglycemic drugs: Secondary | ICD-10-CM

## 2023-11-29 DIAGNOSIS — E872 Acidosis, unspecified: Secondary | ICD-10-CM | POA: Diagnosis present

## 2023-11-29 DIAGNOSIS — R933 Abnormal findings on diagnostic imaging of other parts of digestive tract: Secondary | ICD-10-CM | POA: Diagnosis not present

## 2023-11-29 DIAGNOSIS — G620 Drug-induced polyneuropathy: Secondary | ICD-10-CM | POA: Diagnosis present

## 2023-11-29 DIAGNOSIS — Z79899 Other long term (current) drug therapy: Secondary | ICD-10-CM

## 2023-11-29 DIAGNOSIS — Z8249 Family history of ischemic heart disease and other diseases of the circulatory system: Secondary | ICD-10-CM

## 2023-11-29 DIAGNOSIS — Z85048 Personal history of other malignant neoplasm of rectum, rectosigmoid junction, and anus: Secondary | ICD-10-CM

## 2023-11-29 DIAGNOSIS — K435 Parastomal hernia without obstruction or  gangrene: Secondary | ICD-10-CM | POA: Diagnosis present

## 2023-11-29 LAB — COMPREHENSIVE METABOLIC PANEL
ALT: 13 U/L (ref 0–44)
AST: 14 U/L — ABNORMAL LOW (ref 15–41)
Albumin: 3.8 g/dL (ref 3.5–5.0)
Alkaline Phosphatase: 59 U/L (ref 38–126)
Anion gap: 14 (ref 5–15)
BUN: 23 mg/dL — ABNORMAL HIGH (ref 6–20)
CO2: 20 mmol/L — ABNORMAL LOW (ref 22–32)
Calcium: 9.3 mg/dL (ref 8.9–10.3)
Chloride: 105 mmol/L (ref 98–111)
Creatinine, Ser: 1.1 mg/dL (ref 0.61–1.24)
GFR, Estimated: >60 mL/min (ref >60)
Glucose, Bld: 298 mg/dL — ABNORMAL HIGH (ref 70–99)
Potassium: 4.2 mmol/L (ref 3.5–5.1)
Sodium: 139 mmol/L (ref 135–145)
Total Bilirubin: 1 mg/dL (ref 0.0–1.2)
Total Protein: 7.7 g/dL (ref 6.5–8.1)

## 2023-11-29 LAB — URINALYSIS, ROUTINE W REFLEX MICROSCOPIC
Bilirubin Urine: NEGATIVE
Glucose, UA: >=500 mg/dL — AB
Ketones, ur: 5 mg/dL — AB
Leukocytes,Ua: NEGATIVE
Nitrite: NEGATIVE
Protein, ur: NEGATIVE mg/dL
Specific Gravity, Urine: 1.033 — ABNORMAL HIGH (ref 1.005–1.030)
pH: 5 (ref 5.0–8.0)

## 2023-11-29 LAB — CBC
HCT: 42.4 % (ref 39.0–52.0)
Hemoglobin: 12.9 g/dL — ABNORMAL LOW (ref 13.0–17.0)
MCH: 21.4 pg — ABNORMAL LOW (ref 26.0–34.0)
MCHC: 30.4 g/dL (ref 30.0–36.0)
MCV: 70.2 fL — ABNORMAL LOW (ref 80.0–100.0)
Platelets: 358 10*3/uL (ref 150–400)
RBC: 6.04 MIL/uL — ABNORMAL HIGH (ref 4.22–5.81)
RDW: 19.8 % — ABNORMAL HIGH (ref 11.5–15.5)
WBC: 14.4 10*3/uL — ABNORMAL HIGH (ref 4.0–10.5)
nRBC: 0 % (ref 0.0–0.2)

## 2023-11-29 LAB — LIPASE, BLOOD: Lipase: 38 U/L (ref 11–51)

## 2023-11-29 LAB — CBG MONITORING, ED: Glucose-Capillary: 240 mg/dL — ABNORMAL HIGH (ref 70–99)

## 2023-11-29 MED ORDER — LACTATED RINGERS IV BOLUS: 1000.0000 mL | Freq: Three times a day (TID) | INTRAVENOUS | Status: AC | PRN

## 2023-11-29 MED ORDER — METOPROLOL TARTRATE 5 MG/5ML IV SOLN: 5.0000 mg | Freq: Four times a day (QID) | INTRAVENOUS | Status: DC | PRN

## 2023-11-29 MED ORDER — METRONIDAZOLE 500 MG/100ML IV SOLN
500.0000 mg | Freq: Once | INTRAVENOUS | Status: DC
  Filled 2023-11-29: qty 100

## 2023-11-29 MED ORDER — HEPARIN (PORCINE) 25000 UT/250ML-% IV SOLN
2700.0000 [IU]/h | INTRAVENOUS | Status: AC
  Administered 2023-11-30: 1200 [IU]/h via INTRAVENOUS
  Administered 2023-11-30: 1400 [IU]/h via INTRAVENOUS
  Administered 2023-12-01: 1850 [IU]/h via INTRAVENOUS
  Administered 2023-12-01: 2100 [IU]/h via INTRAVENOUS
  Administered 2023-12-02: 2500 [IU]/h via INTRAVENOUS
  Filled 2023-11-29 (×5): qty 250

## 2023-11-29 MED ORDER — KETOROLAC TROMETHAMINE 15 MG/ML IJ SOLN
15.0000 mg | Freq: Four times a day (QID) | INTRAMUSCULAR | Status: DC | PRN
  Administered 2023-11-29: 15 mg via INTRAVENOUS
  Filled 2023-11-29: qty 1

## 2023-11-29 MED ORDER — NAPHAZOLINE-GLYCERIN 0.012-0.25 % OP SOLN: 1.0000 [drp] | Freq: Four times a day (QID) | OPHTHALMIC | Status: DC | PRN

## 2023-11-29 MED ORDER — SIMETHICONE 40 MG/0.6ML PO SUSP
80.0000 mg | Freq: Four times a day (QID) | ORAL | Status: DC | PRN
Start: 2023-11-29 — End: 2023-12-09
  Administered 2023-12-07: 80 mg via ORAL
  Filled 2023-11-29 (×3): qty 1.2

## 2023-11-29 MED ORDER — MENTHOL 3 MG MT LOZG: 1.0000 | LOZENGE | OROMUCOSAL | Status: DC | PRN

## 2023-11-29 MED ORDER — LORAZEPAM 2 MG/ML IJ SOLN: 0.5000 mg | Freq: Three times a day (TID) | INTRAMUSCULAR | Status: DC | PRN

## 2023-11-29 MED ORDER — ALBUTEROL SULFATE HFA 108 (90 BASE) MCG/ACT IN AERS: 2.0000 | INHALATION_SPRAY | RESPIRATORY_TRACT | Status: DC | PRN

## 2023-11-29 MED ORDER — ONDANSETRON HCL 4 MG/2ML IJ SOLN
4.0000 mg | Freq: Once | INTRAMUSCULAR | Status: AC
  Administered 2023-11-29: 4 mg via INTRAVENOUS
  Filled 2023-11-29: qty 2

## 2023-11-29 MED ORDER — IOHEXOL 300 MG/ML  SOLN
100.0000 mL | Freq: Once | INTRAMUSCULAR | Status: AC | PRN
  Administered 2023-11-29: 100 mL via INTRAVENOUS

## 2023-11-29 MED ORDER — DIPHENHYDRAMINE HCL 25 MG PO CAPS: 50.0000 mg | ORAL_CAPSULE | Freq: Once | ORAL | Status: AC

## 2023-11-29 MED ORDER — MAGIC MOUTHWASH: 15.0000 mL | Freq: Four times a day (QID) | ORAL | Status: DC | PRN

## 2023-11-29 MED ORDER — PHENOL 1.4 % MT LIQD: 2.0000 | OROMUCOSAL | Status: DC | PRN

## 2023-11-29 MED ORDER — METHYLPREDNISOLONE SODIUM SUCC 125 MG IJ SOLR
125.0000 mg | Freq: Once | INTRAMUSCULAR | Status: AC
  Administered 2023-11-29: 125 mg via INTRAVENOUS
  Filled 2023-11-29: qty 2

## 2023-11-29 MED ORDER — METHOCARBAMOL 1000 MG/10ML IJ SOLN
1000.0000 mg | Freq: Four times a day (QID) | INTRAMUSCULAR | Status: DC | PRN
Start: 2023-11-29 — End: 2023-12-03
  Administered 2023-11-30 – 2023-12-03 (×3): 1000 mg via INTRAVENOUS
  Filled 2023-11-29 (×3): qty 10

## 2023-11-29 MED ORDER — SODIUM CHLORIDE 0.9 % IV SOLN: 2.0000 g | INTRAVENOUS | Status: DC

## 2023-11-29 MED ORDER — SALINE SPRAY 0.65 % NA SOLN: 1.0000 | Freq: Four times a day (QID) | NASAL | Status: DC | PRN

## 2023-11-29 MED ORDER — PIPERACILLIN-TAZOBACTAM 3.375 G IVPB
3.3750 g | Freq: Three times a day (TID) | INTRAVENOUS | Status: AC
  Administered 2023-11-29 – 2023-12-04 (×15): 3.375 g via INTRAVENOUS
  Filled 2023-11-29 (×14): qty 50

## 2023-11-29 MED ORDER — MORPHINE SULFATE (PF) 4 MG/ML IV SOLN
4.0000 mg | Freq: Once | INTRAVENOUS | Status: AC
  Administered 2023-11-29: 4 mg via INTRAVENOUS
  Filled 2023-11-29: qty 1

## 2023-11-29 MED ORDER — DIPHENHYDRAMINE HCL 50 MG/ML IJ SOLN
50.0000 mg | Freq: Once | INTRAMUSCULAR | Status: AC
  Administered 2023-11-29: 50 mg via INTRAVENOUS
  Filled 2023-11-29: qty 1

## 2023-11-29 MED ORDER — INSULIN ASPART 100 UNIT/ML IJ SOLN
0.0000 [IU] | INTRAMUSCULAR | Status: DC
  Administered 2023-11-29: 5 [IU] via SUBCUTANEOUS
  Administered 2023-11-30: 2 [IU] via SUBCUTANEOUS
  Administered 2023-11-30: 3 [IU] via SUBCUTANEOUS
  Administered 2023-11-30: 5 [IU] via SUBCUTANEOUS
  Administered 2023-11-30 – 2023-12-02 (×7): 2 [IU] via SUBCUTANEOUS
  Administered 2023-12-02: 8 [IU] via SUBCUTANEOUS
  Administered 2023-12-03: 5 [IU] via SUBCUTANEOUS
  Administered 2023-12-03 (×2): 3 [IU] via SUBCUTANEOUS
  Administered 2023-12-03 (×2): 5 [IU] via SUBCUTANEOUS
  Filled 2023-11-29: qty 0.15

## 2023-11-29 MED ORDER — ALUM & MAG HYDROXIDE-SIMETH 200-200-20 MG/5ML PO SUSP
30.0000 mL | Freq: Four times a day (QID) | ORAL | Status: DC | PRN
  Administered 2023-12-03 – 2023-12-08 (×6): 30 mL via ORAL
  Filled 2023-11-29 (×7): qty 30

## 2023-11-29 MED ORDER — HYDROMORPHONE HCL 1 MG/ML IJ SOLN
0.5000 mg | INTRAMUSCULAR | Status: DC | PRN
  Administered 2023-12-03 (×2): 2 mg via INTRAVENOUS
  Administered 2023-12-03: 1 mg via INTRAVENOUS
  Administered 2023-12-03: 2 mg via INTRAVENOUS
  Administered 2023-12-03: 1 mg via INTRAVENOUS
  Administered 2023-12-04 – 2023-12-06 (×6): 2 mg via INTRAVENOUS
  Filled 2023-11-29 (×5): qty 2
  Filled 2023-11-29: qty 1
  Filled 2023-11-29: qty 2
  Filled 2023-11-29: qty 1
  Filled 2023-11-29 (×3): qty 2

## 2023-11-29 MED ORDER — PROCHLORPERAZINE EDISYLATE 10 MG/2ML IJ SOLN: 5.0000 mg | INTRAMUSCULAR | Status: DC | PRN

## 2023-11-29 MED ORDER — SODIUM CHLORIDE 0.9 % IV BOLUS
1000.0000 mL | Freq: Once | INTRAVENOUS | Status: AC
  Administered 2023-11-29: 1000 mL via INTRAVENOUS

## 2023-11-29 MED ORDER — ONDANSETRON HCL 4 MG/2ML IJ SOLN
4.0000 mg | Freq: Four times a day (QID) | INTRAMUSCULAR | Status: DC | PRN
  Administered 2023-12-02 – 2023-12-05 (×2): 4 mg via INTRAVENOUS
  Filled 2023-11-29: qty 2

## 2023-11-29 MED ORDER — DIPHENHYDRAMINE HCL 50 MG/ML IJ SOLN
12.5000 mg | Freq: Four times a day (QID) | INTRAMUSCULAR | Status: DC | PRN
  Administered 2023-12-07: 25 mg via INTRAVENOUS
  Filled 2023-11-29: qty 1

## 2023-11-29 MED ORDER — LACTATED RINGERS IV BOLUS
1000.0000 mL | Freq: Once | INTRAVENOUS | Status: AC
  Administered 2023-11-29: 1000 mL via INTRAVENOUS

## 2023-11-29 NOTE — ED Notes (Addendum)
 Benadryl given. CT made aware.

## 2023-11-29 NOTE — Consult Note (Signed)
 Steven [name]  1971-07-22 [id number]  CARE TEAM:  PCP: Steven Retort, MD  Outpatient Care Team: Patient Care Team: Steven Retort, MD as PCP - General (Family Medicine) Steven Moulds, MD as Consulting Physician (Hematology and Oncology) Steven Levee, MD as Consulting Physician (General Surgery) [name], Steven [name] as Nurse Practitioner (Hematology and Oncology) Steven Puffer, MD as Consulting Physician (Radiation Oncology) Steven [name], Steven [name]., MD as Consulting Physician (Gastroenterology)  Inpatient Treatment Team: Treatment Team:  Steven Pander, MD [name], NT [name], [name], [place] Steven Sago D, RN [name], [name], NT [name], Md, MD Steven Levee, MD   This patient is a 53 y.o.male who presents today for surgical evaluation at the request of Dr Steven [name], [name].   Chief complaint / Reason for evaluation:  [name] and fluid in pelvis suspicious for abscess and/or perforation  53 year old male who presented with pulmonary emboli in 2022.  Workup noted mass in rectum on CAT scan and was confirmed to have rectal cancer. June 2022  T3 on initial diagnosis so neoadjuvant chemotherapy proceeded.  Patient underwent robotic low anterior resection with diverting loop ileostomy November 2022.  Rather low anastomosis.  Rather hostile concrete pelvis.  Omental pedicle flap created that was nonviable.  Patient developed postoperative anterior anastomotic leak.  Controlled with diverting loop ileostomy already in place with drains.  Clinically stabilized and controlled by Dr. Al [name] with medical oncology.  Given post adjuvant Xeloda x 6 cycles.    He had a persistent anastomotic chronic leak.  He underwent anorectal examination under anesthesia July 2023 with debridement and unroofing of the chronically cavity by Dr. Maisie [name].  The leak remained nonhealing and therefore was taken the operating room for robotic takedown of low colorectal anastomosis and converted to end  colostomy April 2024.  He developed rectal stump dehiscence not healing but appeared to be draining transanally with chronic abscess cavity.  Followed closely by Dr. Maisie [name] with colorectal surgery and Dr. Arvil [name] with medical oncology.  Discussion about trying to do some transanal irrigation enemas to help wash out the pelvis gradually.  Last seen by Dr. Maisie [name] November 2020 for.  4-month follow-up next month plans to do screening colonoscopy by Gastroenterology later this month.  He has not been on chemotherapy for the past year.  Patient called this morning with concerns of nausea vomiting and abdominal [name].  Claims he is passing [name] and has a colostomy but output has gone down.  Given concerns recommendation made to go to the ER.  Patient not in shock but CAT scan raises concerns of persistent and possibly worsening [name] and pelvic fluid collection in the pelvis near small bowel.  Suspicious for possible small bowel perforation versus worsening abscess.  Glucoses in the high 200s.  Remains fully anticoagulated on Xarelto.   Assessment  Steven [name]  53 y.o. male       Problem List:  Active Problems:   Adenocarcinoma of rectum (HCC)   Anxiety   OSA (obstructive sleep apnea)   Uncontrolled diabetes mellitus with hyperglycemia (HCC)   Obesity (BMI 30-39.9)   Gastroesophageal reflux disease   Peripheral neuropathy due to chemotherapy Steven [name])   Chronic pelvic abscess due to rectal stump disruption   Parastomal hernia without obstruction or gangrene   Colostomy in place Steven [name])   History of pulmonary embolus (PE)   Chronic anticoagulation   Nausea vomiting Steven [name] and fluid pelvis.  Known chronic rectal stump dehiscence with chronic abscess.  Possible  contained bowel perforation  Plan:  Admit  IV antibiotics.  Would start with Zosyn for standard complicated intra-abdominal infection coverage.  Rule out MRSA given admissions and numerous hospitalizations since last negative  check 4 years ago.  IV fluids.  Would bolus for dehydration.  No severe evidence of AKI so far  Nausea and [name] control.  Bowel rest.  I would do NG tube to more aggressively control this.  Normally would consider small bowel protocol with Gastrografin.  Patient does have allergy to iodinated contrast.  I wonder if that is just IV only.  Will hold off and they can reevaluate/discussed benefits of doing that with follow-up CT of the pelvis to rule out true small bowel leak.  Moderate size parastomal hernia around right lower quadrant colostomy containing nonobstructed small bowel loops.  Nothing urgently need to be done surgically.  Chronic pelvic abscess cavity sitting on bladder may explain abnormal urinalysis.  Doubt that is the etiology.  Consider urine culture just in case.  Hyperglycemia in the setting of diabetes on numerous medications.  Sliding scale insulin.  Check hemoglobin A1c.  Medical management.  History of pulmonary embolisms which was the pathognomonic sign of his rectal cancer.  On chronic Xarelto anticoagulation.  Need to hold that for now if possible in case drainage needed.  No easy options to treat the chronic pelvic abscess and possible contained perforation.  Given prior history of radiation chemotherapy and chronic anastomotic nonhealing leak care, it is not surprising he has a chronic [name] and pelvic fluid collection down there but this seems a little more than expected.  Hard to say since it has not been a follow-up CT scan in 11 months.  Patient would have extremely hostile abdomen with concrete adhesions of the pelvis making a surgical intervention high risk and rather morbid.  There is no hard indications for emergency surgery tonight.  It may come to that but I would hold off.  Would be nice to get a drain into that region but it may be hard to get a good transgluteal window.  Input from his colorectal surgeon, Dr.  Maisie [name], will be helpful.  Oncoming surgeon Dr. Cliffton Asters  has special training and colorectal care as well and can offer some insights with planning    I reviewed nursing notes, ED provider notes, last 24 h vitals and [name] scores, last 48 h intake and output, last 24 h labs and trends, and last 24 h imaging results. I have reviewed this patient's available data, including medical history, events of note, test results, etc as part of my evaluation.  A significant portion of that time was spent in counseling.  Care during the described time interval was provided by me.  This care required high  level of medical decision making.  11/29/2023  Ardeth Sportsman, MD, FACS, MASCRS Esophageal, Gastrointestinal & Colorectal Surgery Robotic and Minimally Invasive Surgery  [name] [address] [place], [place] [address] [phone] Fax [phone] Main  CONTACT INFORMATION: Weekday (9AM-5PM): Call [name] main office at [phone] Weeknight (5PM-9AM) or Weekend/Holiday: Check EPIC "Web Links" tab & use "AMION" (password " [email or web address]") for General Surgery [name] coverage  Please, DO NOT use SecureChat  (it is not reliable communication to reach operating surgeons & will lead to a delay in care).   Epic staff messaging available for outptient concerns needing 1-2 business day response.      11/29/2023  Past Medical History:  Diagnosis Date   Anemia 02/2021   iron   Anxiety    Asthma    Bilateral pulmonary embolism (HCC) 04/24/2019   Dehiscence of anastomosis of large intestine 12/31/2022   Depression    Diabetes mellitus without complication (HCC)    History of kidney stones    HLD (hyperlipidemia)    Hypertension    Kidney stones    Neuromuscular disorder (HCC)    from radiation bi lat   OSA (obstructive sleep apnea)    CPAP   Peripheral vascular disease (HCC)    DVT   rectal ca 02/2021   Sleep apnea     Past Surgical History:  Procedure Laterality Date   ABDOMINAL  SURGERY     APPENDECTOMY  1989   ag   COLONOSCOPY  03/05/2021   CYST EXCISION  2011   left neck   DIVERTING ILEOSTOMY N/A 07/17/2021   Procedure: DIVERTING ILEOSTOMY;  Surgeon: Steven Levee, MD;  Location: [facility];  Service: General;  Laterality: N/A;   FLEXIBLE SIGMOIDOSCOPY N/A 04/04/2022   Procedure: FLEXIBLE SIGMOIDOSCOPY WITH POSSIBLE DEBRIDEMENT;  Surgeon: Steven Levee, MD;  Location[name];  Service: General;  Laterality: N/A;   RECTAL EXAM UNDER ANESTHESIA N/A 04/04/2022   Procedure: ANAL EXAM UNDER ANESTHESIA;  Surgeon: Steven Levee, MD;  Location: [name];  Service: General;  Laterality: N/A;   WISDOM TOOTH EXTRACTION     age 36   XI ROBOTIC ASSISTED LOWER ANTERIOR RESECTION N/A 07/17/2021   Procedure: XI ROBOTIC ASSISTED LOWER ANTERIOR RESECTION;  Surgeon: Steven Levee, MD;  Location: [facility];  Service: General;  Laterality: N/A;   XI ROBOTIC ASSISTED LOWER ANTERIOR RESECTION N/A 12/31/2022   Procedure: ROBOTIC ASSISTED LOWER ANTERIOR RESECTION WITH OSTOMY with intraop ICG guided perfusion, takedown of iliostomy;  Surgeon: Steven Levee, MD;  Location: [facility];  Service: General;  Laterality: N/A;    Social History   Socioeconomic History   Marital status: Married    Spouse name: Not on file   Number of children: 3   Years of education: Not on file   Highest education level: Not on file  Occupational History   Occupation: Systems analyst  Tobacco Use   Smoking status: Never   Smokeless tobacco: Never  Vaping Use   Vaping status: Never Used  Substance and Sexual Activity   Alcohol use: Yes    Comment: 1 beer every 3-4 months   Drug use: Never   Sexual activity: Yes  Other Topics Concern   Not on file  Social History Narrative   Not on file   Social Drivers of Health   Financial Resource Strain: Not on file  Food Insecurity: No Food Insecurity (01/12/2023)   Hunger Vital Sign    Worried About Running Out of  Food in the Last Year: Never true    Ran Out of Food in the Last Year: Never true  Transportation Needs: No Transportation Needs (01/12/2023)   PRAPARE - Administrator, Civil Service (Medical): No    Lack of Transportation (Non-Medical): No  Physical Activity: Not on file  Stress: Not on file  Social Connections: Not on file  Intimate Partner Violence: Not At Risk (01/12/2023)   Humiliation, Afraid, Rape, and Kick questionnaire    Fear of Current or Ex-Partner: No    Emotionally Abused: No    Physically Abused: No    Sexually Abused: No    Family History  Problem  Relation Age of Onset   Diabetes Mellitus II Mother    Breast cancer Mother 57   Thrombophlebitis Father    Breast cancer Maternal Grandmother 30   Diabetes Maternal Grandmother    Breast cancer Maternal Aunt 60   Diabetes Maternal Aunt    Hypertension Paternal Grandmother    Colon cancer Neg Hx    Esophageal cancer Neg Hx    Inflammatory bowel disease Neg Hx    Liver disease Neg Hx    Pancreatic cancer Neg Hx    Rectal cancer Neg Hx    Stomach cancer Neg Hx     Current Facility-Administered Medications  Medication Dose Route Frequency Provider Last Rate Last Admin   0.9 %  sodium chloride infusion  500 mL Intravenous Continuous Steven [name], Steven [name]., MD       albuterol (VENTOLIN HFA) 108 (90 Base) MCG/ACT inhaler 2 puff  2 puff Inhalation Q4H PRN Karie Soda, MD       alum & mag hydroxide-simeth (MAALOX/MYLANTA) 200-200-20 MG/5ML suspension 30 mL  30 mL Oral Q6H PRN Karie Soda, MD       diphenhydrAMINE (BENADRYL) injection 12.5-25 mg  12.5-25 mg Intravenous Q6H PRN Karie Soda, MD       HYDROmorphone (DILAUDID) injection 0.5-2 mg  0.5-2 mg Intravenous Q2H PRN Karie Soda, MD       insulin aspart (novoLOG) injection 0-15 Units  0-15 Units Subcutaneous Q4H Karie Soda, MD       ketorolac (TORADOL) 15 MG/ML injection 15-30 mg  15-30 mg Intravenous Q6H PRN Karie Soda, MD       lactated  ringers bolus 1,000 mL  1,000 mL Intravenous Q8H PRN Benson Porcaro, Viviann Spare, MD       LORazepam (ATIVAN) injection 0.5-1 mg  0.5-1 mg Intravenous Q8H PRN Karie Soda, MD       magic mouthwash  15 mL Oral QID PRN Karie Soda, MD       menthol-cetylpyridinium (CEPACOL) lozenge 3 mg  1 lozenge Oral PRN Karie Soda, MD       methocarbamol (ROBAXIN) injection 1,000 mg  1,000 mg Intravenous Q6H PRN Karie Soda, MD       metoprolol tartrate (LOPRESSOR) injection 5 mg  5 mg Intravenous Q6H PRN Karie Soda, MD       naphazoline-glycerin (CLEAR EYES REDNESS) ophth solution 1-2 drop  1-2 drop Both Eyes QID PRN Karie Soda, MD       ondansetron (ZOFRAN) injection 4 mg  4 mg Intravenous Q6H PRN Karie Soda, MD       phenol (CHLORASEPTIC) mouth spray 2 spray  2 spray Mouth/Throat PRN Karie Soda, MD       piperacillin-tazobactam (ZOSYN) IVPB 3.375 g  3.375 g Intravenous Trixie Deis, MD       prochlorperazine (COMPAZINE) injection 5-10 mg  5-10 mg Intravenous Q4H PRN Karie Soda, MD       simethicone (MYLICON) 40 MG/0.6ML suspension 80 mg  80 mg Oral QID PRN Karie Soda, MD       sodium chloride (OCEAN) 0.65 % nasal spray 1-2 spray  1-2 spray Each Nare Q6H PRN Karie Soda, MD       Current Outpatient Medications  Medication Sig Dispense Refill   acetaminophen (TYLENOL) 500 MG tablet Take 1,000 mg by mouth every 6 (six) hours as needed for mild [name].     albuterol (PROAIR HFA) 108 (90 Base) MCG/ACT inhaler Inhale 2 puffs into the lungs every 4 (four) hours as needed for wheezing or shortness  of breath.     ALPRAZolam (XANAX) 0.5 MG tablet Take 0.5 mg by mouth daily as needed for anxiety.     blood glucose meter kit and supplies KIT Dispense based on patient and insurance preference. Use up to four times daily as directed. (FOR ICD-9 250.00, 250.01). 1 each 0   calcium carbonate (TUMS - DOSED IN MG ELEMENTAL CALCIUM) 500 MG chewable tablet Chew 2 tablets by mouth daily as needed for indigestion  or heartburn.     Dapagliflozin Pro-metFORMIN ER (XIGDUO XR) 06-999 MG TB24 Take by mouth.     fenofibrate 160 MG tablet Take 160 mg by mouth daily.     ibuprofen (ADVIL) 200 MG tablet Take 600 mg by mouth every 6 (six) hours as needed for moderate [name].     neomycin-bacitracin-polymyxin (NEOSPORIN) OINT Apply 1 Application topically as needed for wound care.     predniSONE (DELTASONE) 50 MG tablet Take 1 tablet every 6 hours starting 13 hours before scan and with last dose take 50 mg benadryl 3 tablet 1   rivaroxaban (XARELTO) 20 MG TABS tablet Take 1 tablet (20 mg total) by mouth daily with supper. 90 tablet 3   tetrahydrozoline-zinc (VISINE-AC) 0.05-0.25 % ophthalmic solution Place 2 drops into both eyes 4 (four) times daily as needed (allergy).     traMADol (ULTRAM) 50 MG tablet Take 1-2 tablets (50-100 mg total) by mouth every 6 (six) hours as needed for moderate [name]. 20 tablet 0     Allergies  Allergen Reactions   Contrast Media [Iodinated Contrast Media] Hives and Nausea Only    As a 53 year old experienced hives and nausea     BP 131/75   Pulse 100   Temp 98.2 F (36.8 C) (Oral)   Resp 18   Ht 5\' 10"  (1.778 m)   Wt 88.5 kg   SpO2 97%   BMI 27.98 kg/m     Results:   Labs: Results for orders placed or performed during the [name] encounter of 11/29/23 (from the past 48 hours)  Lipase, blood     Status: None   Collection Time: 11/29/23  4:11 PM  Result Value Ref Range   Lipase 38 11 - 51 U/L    Comment: Performed at [name], [address], [place], [place] [address]  Comprehensive metabolic panel     Status: Abnormal   Collection Time: 11/29/23  4:11 PM  Result Value Ref Range   Sodium 139 135 - 145 mmol/L   Potassium 4.2 3.5 - 5.1 mmol/L   Chloride 105 98 - 111 mmol/L   CO2 20 (L) 22 - 32 mmol/L   Glucose, Bld 298 (H) 70 - 99 mg/dL    Comment: Glucose reference range applies only to samples taken after fasting for at least 8 hours.    BUN 23 (H) 6 - 20 mg/dL   Creatinine, Ser 6.04 0.61 - 1.24 mg/dL   Calcium 9.3 8.9 - 54.0 mg/dL   Total Protein 7.7 6.5 - 8.1 g/dL   Albumin 3.8 3.5 - 5.0 g/dL   AST 14 (L) 15 - 41 U/L   ALT 13 0 - 44 U/L   Alkaline Phosphatase 59 38 - 126 U/L   Total Bilirubin 1.0 0.0 - 1.2 mg/dL   GFR, Estimated >98 >11 mL/min    Comment: (NOTE) Calculated using the CKD-EPI Creatinine Equation (2021)    Anion gap 14 5 - 15    Comment: Performed at [name], [address], [place], [place] [address]  CBC     Status: Abnormal   Collection Time: 11/29/23  4:11 PM  Result Value Ref Range   WBC 14.4 (H) 4.0 - 10.5 K/uL   RBC 6.04 (H) 4.22 - 5.81 MIL/uL   Hemoglobin 12.9 (L) 13.0 - 17.0 g/dL   HCT 40.9 81.1 - 91.4 %   MCV 70.2 (L) 80.0 - 100.0 fL   MCH 21.4 (L) 26.0 - 34.0 pg   MCHC 30.4 30.0 - 36.0 g/dL   RDW 78.2 (H) 95.6 - 21.3 %   Platelets 358 150 - 400 K/uL   nRBC 0.0 0.0 - 0.2 %    Comment: Performed at [name], [address], [place], [place] [address]  Urinalysis, Routine w reflex microscopic -Urine, Clean Catch     Status: Abnormal   Collection Time: 11/29/23  8:37 PM  Result Value Ref Range   Color, Urine YELLOW YELLOW   APPearance HAZY (A) CLEAR   Specific Gravity, Urine 1.033 (H) 1.005 - 1.030   pH 5.0 5.0 - 8.0   Glucose, UA >=500 (A) NEGATIVE mg/dL   Hgb urine dipstick SMALL (A) NEGATIVE   Bilirubin Urine NEGATIVE NEGATIVE   Ketones, ur 5 (A) NEGATIVE mg/dL   Protein, ur NEGATIVE NEGATIVE mg/dL   Nitrite NEGATIVE NEGATIVE   Leukocytes,Ua NEGATIVE NEGATIVE   RBC / HPF 0-5 0 - 5 RBC/hpf   WBC, UA 11-20 0 - 5 WBC/hpf   Bacteria, UA MANY (A) NONE SEEN   Squamous Epithelial / HPF 0-5 0 - 5 /HPF   Mucus PRESENT     Comment: Performed at [facility], [address], [place], [place] [address]    Imaging / Studies: CT CHEST ABDOMEN PELVIS W CONTRAST Result Date: 11/29/2023 CLINICAL DATA:  Nausea and vomiting  with lower abdominal [name], history of rectal carcinoma EXAM: CT CHEST, ABDOMEN, AND PELVIS WITH CONTRAST TECHNIQUE: Multidetector CT imaging of the chest, abdomen and pelvis was performed following the standard protocol during bolus administration of intravenous contrast. RADIATION DOSE REDUCTION: This exam was performed according to the departmental dose-optimization program which includes automated exposure control, adjustment of the mA and/or kV according to patient size and/or use of iterative reconstruction technique. CONTRAST:  OMNIPAQUE IOHEXOL 300 MG/ML SOLN. Premedication was given for known contrast allergy. COMPARISON:  03/09/2023 FINDINGS: CT CHEST FINDINGS Cardiovascular: Thoracic aorta is within normal limits. No cardiac enlargement is seen. Pulmonary artery as visualized is within normal limits. Mediastinum/Nodes: Thoracic inlet is unremarkable. No hilar or mediastinal adenopathy is noted. The esophagus as visualized is within normal limits. Lungs/Pleura: The lungs are well aerated bilaterally. No focal infiltrate or sizable effusion is seen. No parenchymal nodules are noted. Musculoskeletal: No acute bony abnormality is noted. CT ABDOMEN PELVIS FINDINGS Hepatobiliary: No focal liver abnormality is seen. No gallstones, gallbladder wall thickening, or biliary dilatation. Pancreas: Unremarkable. No pancreatic ductal dilatation or surrounding inflammatory changes. Spleen: Normal in size without focal abnormality. Adrenals/Urinary Tract: Adrenal glands are within normal limits. Kidneys demonstrate a normal enhancement pattern bilaterally. Tiny nonobstructing left upper pole renal stone is seen unchanged from the prior study. No obstructive changes are seen. The bladder is within normal limits. Stomach/Bowel: There are changes consistent with a right lower quadrant colostomy. The colon proximal to this is well visualized without inflammatory change. The appendix has been surgically removed. Stomach  is within normal limits. Herniation of multiple small bowel loops adjacent to the colostomy are  seen. Multiple inflamed loops of small bowel are identified matted within the lower pelvis distal to the herniated bowel loops at the level of the colostomy. Considerable wall thickening is noted and there is inflammatory change and multiple extraluminal foci of air consistent with perforation. Considerable mesenteric inflammatory changes are noted as well. There is suggestion of an early extraluminal air fluid collection which measures approximately 4.2 x 3.1 cm. This is best seen on image number 106 of series 2. Postsurgical changes are noted with an ileal to ileal anastomosis which appears widely patent. Fecalized bowel content is noted in this region. This is distal to the matted inflamed loops of small bowel in the pelvis. The matted loops within the pelvis lie adjacent to considerable presacral soft tissue density similar to that seen on the prior exam as well as a an air-fluid collection in the presacral region. This air-fluid collection previously was noted to be bland fluid and the air within now suggests extension of the inflammatory change into this collection. Vascular/Lymphatic: No significant vascular findings are present. No enlarged abdominal or pelvic lymph nodes. Reproductive: Prostate is unremarkable. Other: Scattered free air throughout the abdomen consistent with the known perforation and inflammatory change within the pelvis. Musculoskeletal: No acute or significant osseous findings. IMPRESSION: Significantly inflamed loops of distal jejunum/proximal ileum matted within the lower pelvis adjacent to a previously seen presacral fluid collection. Air is now noted in that presacral fluid collection consistent with extension of local infection and there is considerable extraluminal air and fluid identified adjacent to these loops consistent with developing abscess and perforation. Surgical consultation is  recommended. Multiple herniated loops of small bowel adjacent to the end colostomy in the right lower quadrant. These occur proximal to the areas of inflammation within the more distal small bowel. Tiny nonobstructing left renal stone. No acute abnormality in the chest. Electronically Signed   By: Alcide Clever M.[name].   On: 11/29/2023 21:00    Medications / Allergies: per chart  Antibiotics: Anti-infectives (From admission, onward)    Start     Dose/Rate Route Frequency Ordered Stop   11/29/23 2200  piperacillin-tazobactam (ZOSYN) IVPB 3.375 g        3.375 g 12.5 mL/hr over 240 Minutes Intravenous Every 8 hours 11/29/23 2147 12/04/23 2159   11/29/23 2130  metroNIDAZOLE (FLAGYL) IVPB 500 mg  Status:  Discontinued        500 mg 100 mL/hr over 60 Minutes Intravenous  Once 11/29/23 2119 11/29/23 2147   11/29/23 2130  ceFEPIme (MAXIPIME) 2 g in sodium chloride 0.9 % 100 mL IVPB  Status:  Discontinued        2 g 200 mL/hr over 30 Minutes Intravenous Every 24 hours 11/29/23 2126 11/29/23 2147         Note: Portions of this report may have been transcribed using voice recognition software. Every effort was made to ensure accuracy; however, inadvertent computerized transcription errors may be present.   Any transcriptional errors that result from this process are unintentional.    Ardeth Sportsman, MD, FACS, MASCRS Esophageal, Gastrointestinal & Colorectal Surgery Robotic and Minimally Invasive Surgery  [facility] [address] [place], [place] [address] [phone] Fax [phone] Main  CONTACT INFORMATION: Weekday (9AM-5PM): Call [name] main office at [phone] Weeknight (5PM-9AM) or Weekend/Holiday: Check EPIC "Web Links" tab & use "AMION" (password " [email or web address]") for General Surgery [name] coverage  Please, DO NOT use SecureChat  (it is  not reliable communication to reach operating surgeons & will lead to a delay in  care).   Epic staff messaging available for outptient concerns needing 1-2 business day response.       11/29/2023  10:00 PM

## 2023-11-29 NOTE — ED Provider Notes (Signed)
 [place] EMERGENCY DEPARTMENT AT [facility] Provider Note   CSN: [id number] Arrival date & time: 11/29/23  1457     History  Chief Complaint  Patient presents with   Abdominal Pain    Steven [name] is a 53 y.o. male history of rectal cancer status post colostomy, here presenting with vomiting.  Patient states that his rectal cancer is in remission currently.  Patient states that earlier today, he suddenly had vomiting and severe abdominal cramps.  He is still passing gas.  He denies any history of small bowel obstruction.  Denies eating any bad food or recent travels.  Patient had a CT scan about a year ago that showed that his cancer is in remission.  Patient has no urinary symptoms or fever.  The history is provided by the patient.       Home Medications Prior to Admission medications   Medication Sig Start Date End Date Taking? Authorizing Provider  acetaminophen (TYLENOL) 500 MG tablet Take 1,000 mg by mouth every 6 (six) hours as needed for mild pain.    [provider]  albuterol (PROAIR HFA) 108 (90 Base) MCG/ACT inhaler Inhale 2 puffs into the lungs every 4 (four) hours as needed for wheezing or shortness of breath. 08/30/10   [provider]  ALPRAZolam Prudy Feeler) 0.5 MG tablet Take 0.5 mg by mouth daily as needed for anxiety.    [provider]  blood glucose meter kit and supplies KIT Dispense based on patient and insurance preference. Use up to four times daily as directed. (FOR ICD-9 250.00, 250.01). 07/08/20   Azucena Fallen, MD  calcium carbonate (TUMS - DOSED IN MG ELEMENTAL CALCIUM) 500 MG chewable tablet Chew 2 tablets by mouth daily as needed for indigestion or heartburn.    [provider]  Dapagliflozin Pro-metFORMIN ER (XIGDUO XR) 06-999 MG TB24 Take by mouth.    [provider]  fenofibrate 160 MG tablet Take 160 mg by mouth daily. 02/23/19   [provider]  ibuprofen (ADVIL) 200 MG tablet  Take 600 mg by mouth every 6 (six) hours as needed for moderate pain.    [provider]  neomycin-bacitracin-polymyxin (NEOSPORIN) OINT Apply 1 Application topically as needed for wound care.    [provider]  predniSONE (DELTASONE) 50 MG tablet Take 1 tablet every 6 hours starting 13 hours before scan and with last dose take 50 mg benadryl 11/25/23   Rachel Moulds, MD  rivaroxaban (XARELTO) 20 MG TABS tablet Take 1 tablet (20 mg total) by mouth daily with supper. 04/13/23   Rachel Moulds, MD  tetrahydrozoline-zinc (VISINE-AC) 0.05-0.25 % ophthalmic solution Place 2 drops into both eyes 4 (four) times daily as needed (allergy).    [provider]  traMADol (ULTRAM) 50 MG tablet Take 1-2 tablets (50-100 mg total) by mouth every 6 (six) hours as needed for moderate pain. 01/03/23   Romie Levee, MD      Allergies    Contrast media [iodinated contrast media]    Review of Systems   Review of Systems  Gastrointestinal:  Positive for abdominal pain and vomiting.  All other systems reviewed and are negative.   Physical Exam Updated Vital Signs BP 131/75   Pulse 100   Temp 98.2 F (36.8 C) (Oral)   Resp 18   Ht 5\' 10"  (1.778 m)   Wt 88.5 kg   SpO2 97%   BMI 27.98 kg/m  Physical Exam Vitals and nursing note reviewed.  Constitutional:  Comments: Vomiting   HENT:     Head: Normocephalic.     Mouth/Throat:     Pharynx: Oropharynx is clear.  Eyes:     Extraocular Movements: Extraocular movements intact.     Pupils: Pupils are equal, round, and reactive to light.  Cardiovascular:     Rate and Rhythm: Normal rate and regular rhythm.  Pulmonary:     Effort: Pulmonary effort is normal.     Breath sounds: Normal breath sounds.  Abdominal:     Comments: Mild diffuse tenderness, colostomy with brown stool   Skin:    General: Skin is warm.     Capillary Refill: Capillary refill takes less than 2 seconds.  Neurological:     General: No focal deficit  present.     Mental Status: He is alert and oriented to person, place, and time.  Psychiatric:        Mood and Affect: Mood normal.        Behavior: Behavior normal.     ED Results / Procedures / Treatments   Labs (all labs ordered are listed, but only abnormal results are displayed) Labs Reviewed  COMPREHENSIVE METABOLIC PANEL - Abnormal; Notable for the following components:      Result Value   CO2 20 (*)    Glucose, Bld 298 (*)    BUN 23 (*)    AST 14 (*)    All other components within normal limits  CBC - Abnormal; Notable for the following components:   WBC 14.4 (*)    RBC 6.04 (*)    Hemoglobin 12.9 (*)    MCV 70.2 (*)    MCH 21.4 (*)    RDW 19.8 (*)    All other components within normal limits  URINALYSIS, ROUTINE W REFLEX MICROSCOPIC - Abnormal; Notable for the following components:   APPearance HAZY (*)    Specific Gravity, Urine 1.033 (*)    Glucose, UA >=500 (*)    Hgb urine dipstick SMALL (*)    Ketones, ur 5 (*)    Bacteria, UA MANY (*)    All other components within normal limits  CULTURE, BLOOD (ROUTINE X 2)  CULTURE, BLOOD (ROUTINE X 2)  LIPASE, BLOOD  CBC  MAGNESIUM  PHOSPHORUS  POTASSIUM  POTASSIUM  CREATININE, SERUM  HEMOGLOBIN A1C    EKG None  Radiology CT CHEST ABDOMEN PELVIS W CONTRAST Result Date: 11/29/2023 CLINICAL DATA:  Nausea and vomiting with lower abdominal pain, history of rectal carcinoma EXAM: CT CHEST, ABDOMEN, AND PELVIS WITH CONTRAST TECHNIQUE: Multidetector CT imaging of the chest, abdomen and pelvis was performed following the standard protocol during bolus administration of intravenous contrast. RADIATION DOSE REDUCTION: This exam was performed according to the departmental dose-optimization program which includes automated exposure control, adjustment of the mA and/or kV according to patient size and/or use of iterative reconstruction technique. CONTRAST:  OMNIPAQUE IOHEXOL 300 MG/ML SOLN. Premedication was given for  known contrast allergy. COMPARISON:  03/09/2023 FINDINGS: CT CHEST FINDINGS Cardiovascular: Thoracic aorta is within normal limits. No cardiac enlargement is seen. Pulmonary artery as visualized is within normal limits. Mediastinum/Nodes: Thoracic inlet is unremarkable. No hilar or mediastinal adenopathy is noted. The esophagus as visualized is within normal limits. Lungs/Pleura: The lungs are well aerated bilaterally. No focal infiltrate or sizable effusion is seen. No parenchymal nodules are noted. Musculoskeletal: No acute bony abnormality is noted. CT ABDOMEN PELVIS FINDINGS Hepatobiliary: No focal liver abnormality is seen. No gallstones, gallbladder wall thickening, or biliary dilatation. Pancreas:  Unremarkable. No pancreatic ductal dilatation or surrounding inflammatory changes. Spleen: Normal in size without focal abnormality. Adrenals/Urinary Tract: Adrenal glands are within normal limits. Kidneys demonstrate a normal enhancement pattern bilaterally. Tiny nonobstructing left upper pole renal stone is seen unchanged from the prior study. No obstructive changes are seen. The bladder is within normal limits. Stomach/Bowel: There are changes consistent with a right lower quadrant colostomy. The colon proximal to this is well visualized without inflammatory change. The appendix has been surgically removed. Stomach is within normal limits. Herniation of multiple small bowel loops adjacent to the colostomy are seen. Multiple inflamed loops of small bowel are identified matted within the lower pelvis distal to the herniated bowel loops at the level of the colostomy. Considerable wall thickening is noted and there is inflammatory change and multiple extraluminal foci of air consistent with perforation. Considerable mesenteric inflammatory changes are noted as well. There is suggestion of an early extraluminal air fluid collection which measures approximately 4.2 x 3.1 cm. This is best seen on image number 106 of  series 2. Postsurgical changes are noted with an ileal to ileal anastomosis which appears widely patent. Fecalized bowel content is noted in this region. This is distal to the matted inflamed loops of small bowel in the pelvis. The matted loops within the pelvis lie adjacent to considerable presacral soft tissue density similar to that seen on the prior exam as well as a an air-fluid collection in the presacral region. This air-fluid collection previously was noted to be bland fluid and the air within now suggests extension of the inflammatory change into this collection. Vascular/Lymphatic: No significant vascular findings are present. No enlarged abdominal or pelvic lymph nodes. Reproductive: Prostate is unremarkable. Other: Scattered free air throughout the abdomen consistent with the known perforation and inflammatory change within the pelvis. Musculoskeletal: No acute or significant osseous findings. IMPRESSION: Significantly inflamed loops of distal jejunum/proximal ileum matted within the lower pelvis adjacent to a previously seen presacral fluid collection. Air is now noted in that presacral fluid collection consistent with extension of local infection and there is considerable extraluminal air and fluid identified adjacent to these loops consistent with developing abscess and perforation. Surgical consultation is recommended. Multiple herniated loops of small bowel adjacent to the end colostomy in the right lower quadrant. These occur proximal to the areas of inflammation within the more distal small bowel. Tiny nonobstructing left renal stone. No acute abnormality in the chest. Electronically Signed   By: Alcide Clever M.D.   On: 11/29/2023 21:00    Procedures Procedures    CRITICAL CARE Performed by: Richardean Canal   Total critical care time: 30 minutes  Critical care time was exclusive of separately billable procedures and treating other patients.  Critical care was necessary to treat or  prevent imminent or life-threatening deterioration.  Critical care was time spent personally by me on the following activities: development of treatment plan with patient and/or surrogate as well as nursing, discussions with consultants, evaluation of patient's response to treatment, examination of patient, obtaining history from patient or surrogate, ordering and performing treatments and interventions, ordering and review of laboratory studies, ordering and review of radiographic studies, pulse oximetry and re-evaluation of patient's condition.   Medications Ordered in ED Medications  lactated ringers bolus 1,000 mL (has no administration in time range)  lactated ringers bolus 1,000 mL (has no administration in time range)  piperacillin-tazobactam (ZOSYN) IVPB 3.375 g (has no administration in time range)  HYDROmorphone (DILAUDID) injection 0.5-2 mg (  has no administration in time range)  methocarbamol (ROBAXIN) injection 1,000 mg (has no administration in time range)  ketorolac (TORADOL) 15 MG/ML injection 15-30 mg (has no administration in time range)  ondansetron (ZOFRAN) injection 4 mg (has no administration in time range)  prochlorperazine (COMPAZINE) injection 5-10 mg (has no administration in time range)  phenol (CHLORASEPTIC) mouth spray 2 spray (has no administration in time range)  menthol-cetylpyridinium (CEPACOL) lozenge 3 mg (has no administration in time range)  magic mouthwash (has no administration in time range)  alum & mag hydroxide-simeth (MAALOX/MYLANTA) 200-200-20 MG/5ML suspension 30 mL (has no administration in time range)  simethicone (MYLICON) 40 MG/0.6ML suspension 80 mg (has no administration in time range)  naphazoline-glycerin (CLEAR EYES REDNESS) ophth solution 1-2 drop (has no administration in time range)  sodium chloride (OCEAN) 0.65 % nasal spray 1-2 spray (has no administration in time range)  diphenhydrAMINE (BENADRYL) injection 12.5-25 mg (has no  administration in time range)  metoprolol tartrate (LOPRESSOR) injection 5 mg (has no administration in time range)  LORazepam (ATIVAN) injection 0.5-1 mg (has no administration in time range)  insulin aspart (novoLOG) injection 0-15 Units (has no administration in time range)  albuterol (VENTOLIN HFA) 108 (90 Base) MCG/ACT inhaler 2 puff (has no administration in time range)  sodium chloride 0.9 % bolus 1,000 mL (0 mLs Intravenous Stopped 11/29/23 2138)  morphine (PF) 4 MG/ML injection 4 mg (4 mg Intravenous Given 11/29/23 1602)  ondansetron (ZOFRAN) injection 4 mg (4 mg Intravenous Given 11/29/23 1604)  methylPREDNISolone sodium succinate (SOLU-MEDROL) 125 mg/2 mL injection 125 mg (125 mg Intravenous Given 11/29/23 1606)  diphenhydrAMINE (BENADRYL) capsule 50 mg ( Oral See Alternative 11/29/23 1932)    Or  diphenhydrAMINE (BENADRYL) injection 50 mg (50 mg Intravenous Given 11/29/23 1932)  iohexol (OMNIPAQUE) 300 MG/ML solution 100 mL (100 mLs Intravenous Contrast Given 11/29/23 2038)    ED Course/ Medical Decision Making/ A&P                                 Medical Decision Making Dink Creps is a 53 y.o. male here with abdominal pain.  Patient has a history of rectal cancer and has a colostomy.  Concern for possible recurrent cancer versus small bowel obstruction versus gastroenteritis.  Patient has a CT chest abdomen pelvis scheduled by his oncologist.  Unfortunately he does need premedication to get a CT.  At this point we will premedicate to get CT chest abdomen pelvis.  Will hydrate and give pain medicine and reassess  9:32 PM White blood cell count is 14.  CT showed inflamed loops of jejunum and ileum with air in the presacral fluid collection.  There is concern for possible abscess and perforation.  Discussed with Dr. Michaell Cowing from general surgery.  He reviewed imaging as well as compared to previous.  He felt that patient has a chronic fluid collection.  He states that this is not significantly  worse than previous.  Patient had multiple surgeries already.  He did not think that patient is a good surgical candidate and he did take Xarelto.  He reckon IV antibiotics and NG tube and surgery to follow with the patient and medicine to admit.  9:55 PM Hospitalist to admit.   Problems Addressed: Bowel perforation (HCC): acute illness or injury  Amount and/or Complexity of Data Reviewed Labs: ordered. Decision-making details documented in ED Course. Radiology: ordered and independent interpretation performed. Decision-making details documented  in ED Course.  Risk Prescription drug management. Decision regarding hospitalization.   Final Clinical Impression(s) / ED Diagnoses Final diagnoses:  Bowel perforation [facility])    Rx / DC Orders ED Discharge Orders     None         [name], MD 11/29/23 2155

## 2023-11-29 NOTE — Progress Notes (Signed)
 PHARMACY - ANTICOAGULATION CONSULT NOTE  Pharmacy Consult for Heparin Indication: hx of pulmonary embolus  Allergies  Allergen Reactions   Contrast Media [Iodinated Contrast Media] Hives and Nausea Only    As a 53 year old experienced hives and nausea    Patient Measurements: Height: 5\' 10"  (177.8 cm) Weight: 88.5 kg (195 lb) IBW/kg (Calculated) : 73 Heparin Dosing Weight: TBW  Vital Signs: Temp: 98.2 F (36.8 C) (03/02 1903) Temp Source: Oral (03/02 1903) BP: 131/75 (03/02 2137) Pulse Rate: 100 (03/02 2137)  Labs: Recent Labs    11/29/23 1611  HGB 12.9*  HCT 42.4  PLT 358  CREATININE 1.10    Estimated Creatinine Clearance: 88 mL/min (by C-G formula based on SCr of 1.1 mg/dL).   Medical History: Past Medical History:  Diagnosis Date   Anemia 02/2021   iron   Anxiety    Asthma    Bilateral pulmonary embolism (HCC) 04/24/2019   Dehiscence of anastomosis of large intestine 12/31/2022   Depression    Diabetes mellitus without complication (HCC)    History of kidney stones    HLD (hyperlipidemia)    Hypertension    Kidney stones    Neuromuscular disorder (HCC)    from radiation bi lat   OSA (obstructive sleep apnea)    CPAP   Peripheral vascular disease (HCC)    DVT   rectal ca 02/2021   Sleep apnea     Medications:  -Xarelto 20mg  po daily - LD 3/1 @ 2345  Assessment: 37 yoM admitted with chronic pelvic abscess due to rectal stump disruption with perforation/intraperitoneal abscess currently being treated with antibiotics.  He is on chronic Xarelto for hx recurrent VTE which is being held just in case he ends up requiring surgical intervention.  Baseline labs: Hg slightly low but appears to be pt's baseline, pltc WNL, Scr WNL. Coag labs pending.  Anticipate heparin level will be elevated due to recent Xarelto administration.  Monitor heparin using aPTT until correlates with heparin level.   Goal of Therapy:  Heparin level 0.3-0.7 units/ml aPTT  66-102 seconds Monitor platelets by anticoagulation protocol: Yes   Plan:  Start IV heparin 1200 units/hr.  No bolus.  Check 8h aPTT Daily heparin level/aPTT and CBC while on heparin  Junita Push PharmD 11/29/2023,10:53 PM

## 2023-11-29 NOTE — ED Notes (Addendum)
 Pt. Wants to wait on NG tube placement. MD aware.

## 2023-11-29 NOTE — ED Notes (Addendum)
 Benadryl to be given at 1906. CT to take pt at 2006

## 2023-11-29 NOTE — Progress Notes (Signed)
 ED Pharmacy Antibiotic Sign Off An antibiotic consult was received from an ED provider for Cefepime per pharmacy dosing for Bacteremia. A chart review was completed to assess appropriateness.   The following one time order(s) were placed:  Cefepime 2g IV x 1  Further antibiotic and/or antibiotic pharmacy consults should be ordered by the admitting provider if indicated.   Thank you for allowing pharmacy to be a part of this patient's care.   Misty Stanley [place], [facility]  Clinical Pharmacist 11/29/23 9:26 PM

## 2023-11-29 NOTE — H&P (Addendum)
 History and Physical:    Steven [name]   GMW:[id number] DOB: May 22, 1971 DOA: 11/29/2023  Referring MD/provider: Dr. Silverio Lay PCP: Noberto Retort, MD   Patient coming from: Home  Chief Complaint: Abdominal pain  History of Present Illness:   Steven [name] is an 53 y.o. male with a PMH of diabetes, VTE, sleep apnea, and stage IIa adenocarcinoma status post neoadjuvant concurrent chemoradiation 04/03/2021 - 05/10/2021 followed by definitive resection on 07/17/2021.  Pathology at that time showed complete response with no residual carcinoma and he had negative lymph nodes and margins.  Postoperative course was complicated by anatomic dehiscence.  Because of the slow healing and lack of improvement from antibiotics, he had a delay in starting postoperative adjuvant chemotherapy with Xeloda, which was ultimately started 09/09/2021.  He completed 6 cycles of this.   Due to a persistent anastomotic chronic leak, he underwent anorectal examination under anesthesia in July 2023 with debridement and unroofing of the chronic cavity by Dr. Maisie Fus.  The leak has remained nonhealing and he subsequently was taken to the OR for robotic takedown of low colorectal anastomosis and converted to end colostomy April 2024.  He has had rectal stump dehiscence and ongoing lack of healing.  He reports that he had initial improvement in anal drainage, but over the past 2 days, he has noticed increased drainage that is foul-smelling and a burnt orange color.  He was last seen by his oncologist on 10/27/2023.  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  It was noted that he was having issues with recurrent abscesses, but no known history of MRSA or staph infections.  Follow-up scans are scheduled for June, 2025 but otherwise the oncologist recommended continuing current management.  He presented to the ED with abdominal pain associated  with vomiting.  He reports that he was in his usual health until this morning when he developed the sudden onset of abdominal cramping and vomiting.  He has had 3 semiformed/soft bowel evacuations from his colostomy that have been normal for him up until 9 AM this morning with no subsequent output from the colostomy.  ED Course:  Orazio was evaluated by Dr. Silverio Lay who prescribed a 1 L bolus of fluid, 4 mg of morphine, 4 mg of Zofran, and 125 mg methylprednisolone.  Further diagnostic evaluation revealed a WBC of 14.4, hemoglobin 12.9 and platelet count of 258.  Chemistry showed marked elevation of his blood glucose to 298.  BUN was 23 and creatinine 1.10.  CT scan showed significantly inflamed loops of distal jejunum/proximal ileal matted intestine within the lower pelvis adjacent to a previously seen presacral fluid collection.  Air was noted in the presacral fluid collection consistent with extension of local infection with associated extraluminal air and fluid consistent with a developing abscess/perforation.  Dr. Michaell Cowing of general surgery was subsequently consulted and saw the patient recommended hospitalist admission given the patient's significant comorbidities.  He is not felt to be a good surgical candidate.  He has been initiated on Zosyn.  ROS:   Review of Systems  Constitutional:  Positive for chills and malaise/fatigue.  HENT: Negative.    Eyes: Negative.   Respiratory: Negative.    Cardiovascular: Negative.   Gastrointestinal:  Positive for abdominal pain, nausea and vomiting.  Genitourinary: Negative.   Musculoskeletal: Negative.   Skin:  Positive for itching.  Neurological:  Positive for weakness.  Endo/Heme/Allergies: Negative.   Psychiatric/Behavioral: Negative.  Past Medical History:   Past Medical History:  Diagnosis Date   Anemia 02/2021   iron   Anxiety    Asthma    Bilateral pulmonary embolism (HCC) 04/24/2019   Dehiscence of anastomosis of large intestine  12/31/2022   Depression    Diabetes mellitus without complication (HCC)    History of kidney stones    HLD (hyperlipidemia)    Hypertension    Kidney stones    Neuromuscular disorder (HCC)    from radiation bi lat   OSA (obstructive sleep apnea)    CPAP   Peripheral vascular disease (HCC)    DVT   rectal ca 02/2021   Sleep apnea     Past Surgical History:   Past Surgical History:  Procedure Laterality Date   APPENDECTOMY  1989   open appy   COLONOSCOPY  03/05/2021   CYST EXCISION  2011   left neck   DIVERTING ILEOSTOMY N/A 07/17/2021   Procedure: DIVERTING ILEOSTOMY;  Surgeon: Romie Levee, MD;  Location: [facility];  Service: General;  Laterality: N/A;   FLEXIBLE SIGMOIDOSCOPY N/A 04/04/2022   Procedure: FLEXIBLE SIGMOIDOSCOPY WITH POSSIBLE DEBRIDEMENT;  Surgeon: Romie Levee, MD;  Location: [facility];  Service: General;  Laterality: N/A;   RECTAL EXAM UNDER ANESTHESIA N/A 04/04/2022   Procedure: ANAL EXAM UNDER ANESTHESIA;  Surgeon: Romie Levee, MD;  Location: [facility];  Service: General;  Laterality: N/A;   WISDOM TOOTH EXTRACTION     age 37   XI ROBOTIC ASSISTED LOWER ANTERIOR RESECTION N/A 07/17/2021   Procedure: XI ROBOTIC ASSISTED LOWER ANTERIOR RESECTION;  Surgeon: Romie Levee, MD;  Location: [facility];  Service: General;  Laterality: N/A;   XI ROBOTIC ASSISTED LOWER ANTERIOR RESECTION N/A 12/31/2022   Procedure: ROBOTIC ASSISTED LOWER ANTERIOR RESECTION WITH OSTOMY with intraop ICG guided perfusion, takedown of iliostomy;  Surgeon: Romie Levee, MD;  Location: [facility];  Service: General;  Laterality: N/A;    Social History:   Social History   Socioeconomic History   Marital status: Married    Spouse name: Not on file   Number of children: 3   Years of education: Not on file   Highest education level: Not on file  Occupational History   Occupation: Systems analyst  Tobacco Use   Smoking status: Never   Smokeless  tobacco: Never  Vaping Use   Vaping status: Never Used  Substance and Sexual Activity   Alcohol use: Yes    Comment: 1 beer every 3-4 months   Drug use: Never   Sexual activity: Yes  Other Topics Concern   Not on file  Social History Narrative   Not on file   Social Drivers of Health   Financial Resource Strain: Not on file  Food Insecurity: No Food Insecurity (01/12/2023)   Hunger Vital Sign    Worried About Running Out of Food in the Last Year: Never true    Ran Out of Food in the Last Year: Never true  Transportation Needs: No Transportation Needs (01/12/2023)   PRAPARE - Administrator, Civil Service (Medical): No    Lack of Transportation (Non-Medical): No  Physical Activity: Not on file  Stress: Not on file  Social Connections: Not on file  Intimate Partner Violence: Not At Risk (01/12/2023)   Humiliation, Afraid, Rape, and Kick questionnaire    Fear of Current or Ex-Partner: No    Emotionally Abused: No    Physically Abused: No    Sexually  Abused: No    Allergies   Contrast media [iodinated contrast media]  Family history:   Family History  Problem Relation Age of Onset   Diabetes Mellitus II Mother    Breast cancer Mother 13   Thrombophlebitis Father    Breast cancer Maternal Grandmother 58   Diabetes Maternal Grandmother    Breast cancer Maternal Aunt 60   Diabetes Maternal Aunt    Hypertension Paternal Grandmother    Colon cancer Neg Hx    Esophageal cancer Neg Hx    Inflammatory bowel disease Neg Hx    Liver disease Neg Hx    Pancreatic cancer Neg Hx    Rectal cancer Neg Hx    Stomach cancer Neg Hx     Current Medications:   Prior to Admission medications   Medication Sig Start Date End Date Taking? Authorizing Provider  acetaminophen (TYLENOL) 500 MG tablet Take 1,000 mg by mouth every 6 (six) hours as needed for mild pain.    [provider]  albuterol (PROAIR HFA) 108 (90 Base) MCG/ACT inhaler Inhale 2 puffs into the  lungs every 4 (four) hours as needed for wheezing or shortness of breath. 08/30/10   [provider]  ALPRAZolam Prudy Feeler) 0.5 MG tablet Take 0.5 mg by mouth daily as needed for anxiety.    [provider]  blood glucose meter kit and supplies KIT Dispense based on patient and insurance preference. Use up to four times daily as directed. (FOR ICD-9 250.00, 250.01). 07/08/20   Azucena Fallen, MD  calcium carbonate (TUMS - DOSED IN MG ELEMENTAL CALCIUM) 500 MG chewable tablet Chew 2 tablets by mouth daily as needed for indigestion or heartburn.    [provider]  Dapagliflozin Pro-metFORMIN ER (XIGDUO XR) 06-999 MG TB24 Take by mouth.    [provider]  fenofibrate 160 MG tablet Take 160 mg by mouth daily. 02/23/19   [provider]  ibuprofen (ADVIL) 200 MG tablet Take 600 mg by mouth every 6 (six) hours as needed for moderate pain.    [provider]  neomycin-bacitracin-polymyxin (NEOSPORIN) OINT Apply 1 Application topically as needed for wound care.    [provider]  predniSONE (DELTASONE) 50 MG tablet Take 1 tablet every 6 hours starting 13 hours before scan and with last dose take 50 mg benadryl 11/25/23   Rachel Moulds, MD  rivaroxaban (XARELTO) 20 MG TABS tablet Take 1 tablet (20 mg total) by mouth daily with supper. 04/13/23   Rachel Moulds, MD  tetrahydrozoline-zinc (VISINE-AC) 0.05-0.25 % ophthalmic solution Place 2 drops into both eyes 4 (four) times daily as needed (allergy).    [provider]  traMADol (ULTRAM) 50 MG tablet Take 1-2 tablets (50-100 mg total) by mouth every 6 (six) hours as needed for moderate pain. 01/03/23   Romie Levee, MD    Physical Exam:   Vitals:   11/29/23 1502 11/29/23 1830 11/29/23 1903 11/29/23 2137  BP: 124/80 121/61  131/75  Pulse: (!) 101 95  100  Resp: 16 16  18   Temp: 98.2 F (36.8 C)  98.2 F (36.8 C)   TempSrc: Oral  Oral   SpO2: 100% 94%  97%  Weight: 88.5 kg      Height: 5\' 10"  (1.778 m)        Physical Exam: Blood pressure 131/75, pulse 100, temperature 98.2 F (36.8 C), temperature source Oral, resp. rate 18, height 5\' 10"  (1.778 m), weight 88.5 kg, SpO2 97%. Gen: Uncomfortable appearing with  spasms of abdominal cramping. Head: Normocephalic, atraumatic. Eyes: Pupils equal, round and reactive to light.  Mouth: Oropharynx reveals moist mucous membranes. Chest: Lungs are clear to auscultation with good air movement. No rales, rhonchi or wheezes. CV: Heart sounds are regular with an S1, S2. No murmurs, rubs, clicks, or gallops.  Abdomen: Softly distended.  Colostomy present right lower quadrant.  Moderate parastomal hernia present.  Tender right upper and right lower quadrant to palpation. Extremities: Extremities are without clubbing, or cyanosis. No edema. Pedal pulses 2+.  Skin: Warm and dry. No rashes, lesions or wounds. Neuro: Alert and oriented times 3; grossly nonfocal.  Psych: Insight is good and judgment is appropriate. Mood and affect normal.   Data Review:    Labs: Basic Metabolic Panel: Recent Labs  Lab 11/29/23 1611  NA 139  K 4.2  CL 105  CO2 20*  GLUCOSE 298*  BUN 23*  CREATININE 1.10  CALCIUM 9.3   Liver Function Tests: Recent Labs  Lab 11/29/23 1611  AST 14*  ALT 13  ALKPHOS 59  BILITOT 1.0  PROT 7.7  ALBUMIN 3.8   Recent Labs  Lab 11/29/23 1611  LIPASE 38   CBC: Recent Labs  Lab 11/29/23 1611  WBC 14.4*  HGB 12.9*  HCT 42.4  MCV 70.2*  PLT 358     Urinalysis    Component Value Date/Time   COLORURINE YELLOW 11/29/2023 2037   APPEARANCEUR HAZY (A) 11/29/2023 2037   LABSPEC 1.033 (H) 11/29/2023 2037   PHURINE 5.0 11/29/2023 2037   GLUCOSEU >=500 (A) 11/29/2023 2037   HGBUR SMALL (A) 11/29/2023 2037   BILIRUBINUR NEGATIVE 11/29/2023 2037   KETONESUR 5 (A) 11/29/2023 2037   PROTEINUR NEGATIVE 11/29/2023 2037   NITRITE NEGATIVE 11/29/2023 2037   LEUKOCYTESUR NEGATIVE 11/29/2023 2037       Radiographic Studies: CT CHEST ABDOMEN PELVIS W CONTRAST Result Date: 11/29/2023 CLINICAL DATA:  Nausea and vomiting with lower abdominal pain, history of rectal carcinoma EXAM: CT CHEST, ABDOMEN, AND PELVIS WITH CONTRAST TECHNIQUE: Multidetector CT imaging of the chest, abdomen and pelvis was performed following the standard protocol during bolus administration of intravenous contrast. RADIATION DOSE REDUCTION: This exam was performed according to the departmental dose-optimization program which includes automated exposure control, adjustment of the mA and/or kV according to patient size and/or use of iterative reconstruction technique. CONTRAST:  OMNIPAQUE IOHEXOL 300 MG/ML SOLN. Premedication was given for known contrast allergy. COMPARISON:  03/09/2023 FINDINGS: CT CHEST FINDINGS Cardiovascular: Thoracic aorta is within normal limits. No cardiac enlargement is seen. Pulmonary artery as visualized is within normal limits. Mediastinum/Nodes: Thoracic inlet is unremarkable. No hilar or mediastinal adenopathy is noted. The esophagus as visualized is within normal limits. Lungs/Pleura: The lungs are well aerated bilaterally. No focal infiltrate or sizable effusion is seen. No parenchymal nodules are noted. Musculoskeletal: No acute bony abnormality is noted. CT ABDOMEN PELVIS FINDINGS Hepatobiliary: No focal liver abnormality is seen. No gallstones, gallbladder wall thickening, or biliary dilatation. Pancreas: Unremarkable. No pancreatic ductal dilatation or surrounding inflammatory changes. Spleen: Normal in size without focal abnormality. Adrenals/Urinary Tract: Adrenal glands are within normal limits. Kidneys demonstrate a normal enhancement pattern bilaterally. Tiny nonobstructing left upper pole renal stone is seen unchanged from the prior study. No obstructive changes are seen. The bladder is within normal limits. Stomach/Bowel: There are changes consistent with a right lower quadrant  colostomy. The colon proximal to this is well visualized without inflammatory change. The appendix has been surgically removed. Stomach is within normal  limits. Herniation of multiple small bowel loops adjacent to the colostomy are seen. Multiple inflamed loops of small bowel are identified matted within the lower pelvis distal to the herniated bowel loops at the level of the colostomy. Considerable wall thickening is noted and there is inflammatory change and multiple extraluminal foci of air consistent with perforation. Considerable mesenteric inflammatory changes are noted as well. There is suggestion of an early extraluminal air fluid collection which measures approximately 4.2 x 3.1 cm. This is best seen on image number 106 of series 2. Postsurgical changes are noted with an ileal to ileal anastomosis which appears widely patent. Fecalized bowel content is noted in this region. This is distal to the matted inflamed loops of small bowel in the pelvis. The matted loops within the pelvis lie adjacent to considerable presacral soft tissue density similar to that seen on the prior exam as well as a an air-fluid collection in the presacral region. This air-fluid collection previously was noted to be bland fluid and the air within now suggests extension of the inflammatory change into this collection. Vascular/Lymphatic: No significant vascular findings are present. No enlarged abdominal or pelvic lymph nodes. Reproductive: Prostate is unremarkable. Other: Scattered free air throughout the abdomen consistent with the known perforation and inflammatory change within the pelvis. Musculoskeletal: No acute or significant osseous findings. IMPRESSION: Significantly inflamed loops of distal jejunum/proximal ileum matted within the lower pelvis adjacent to a previously seen presacral fluid collection. Air is now noted in that presacral fluid collection consistent with extension of local infection and there is considerable  extraluminal air and fluid identified adjacent to these loops consistent with developing abscess and perforation. Surgical consultation is recommended. Multiple herniated loops of small bowel adjacent to the end colostomy in the right lower quadrant. These occur proximal to the areas of inflammation within the more distal small bowel. Tiny nonobstructing left renal stone. No acute abnormality in the chest. Electronically Signed   By: Alcide Clever M.D.   On: 11/29/2023 21:00     Assessment/Plan:   Principal Problem:   Chronic pelvic abscess due to rectal stump disruption with perforation/intraperitoneal abscess Patient has a complex history with recurrent/nonhealing abscess of the rectal area, chronic rectal drainage, now with recurrent intraperitoneal abscess with evidence of perforation on CT.  He has been seen by Dr. Michaell Cowing of surgery who recommends treatment with Zosyn.  He is not a good surgical candidate due to his complex surgical history and the fact that he is on chronic blood thinner medications.  Though he has never tested positive for MRSA, this does remain a high index of suspicion and a nasal swab has been ordered to rule this out.  NG tube with gastric decompression has been ordered by Dr. Michaell Cowing.  Surgery will follow up in the morning.  Active Problems:   Anxiety Ativan ordered as needed.    Asthma Continue Ventolin as needed.    OSA (obstructive sleep apnea) CPAP ordered.    Uncontrolled diabetes mellitus with hyperglycemia (HCC) SSI every 4 hours ordered.  Check hemoglobin A1c.  History of adenocarcinoma of rectum (HCC) Reportedly in full remission.    Parastomal hernia without obstruction or gangrene No immediate indication for emergency surgery or evidence of incarcerated bowel.    Colostomy in place [facility]) Will have ostomy nurse follow.    History of pulmonary embolus (PE)/Chronic anticoagulation Dr. Michaell Cowing recommends holding anticoagulation, would start IV heparin as  this can be stopped fairly quickly if surgery is needed.  Other information:   DVT prophylaxis: IV heparin per pharmacy. Code Status: Full code. Family Communication: Wife at bedside. Disposition Plan: Home when stable.  Consults called: Surgery (seen by Dr. Michaell Cowing) Admission status: Inpatient.  The medical decision making on this patient was of high complexity and the patient is at high risk for clinical deterioration, therefore this is a level 3 visit.   Trula Ore Blessing Ozga [facility] Pager [phone]   How to contact the [facility] Attending or Consulting provider 7A - 7P or covering provider during after hours 7P -7A, for this patient?   Check the care team in [facility] and look for a) attending/consulting [facility] provider listed and b) the [facility] team listed Log into [email or web address] and use [place]'s universal password to access. If you do not have the password, please contact the hospital operator. Locate the [facility] provider you are looking for under [facility] and page to a number that you can be directly reached. If you still have difficulty reaching the provider, please page the [facility] (Director on Call) for the Hospitalists listed on amion for assistance.  11/29/2023, 10:36 PM

## 2023-11-29 NOTE — ED Triage Notes (Signed)
 Vomiting and lower abdominal pain that started this AM. Pt has colostomy and states output is normal, no changes.

## 2023-11-30 ENCOUNTER — Inpatient Hospital Stay (HOSPITAL_COMMUNITY)

## 2023-11-30 DIAGNOSIS — K651 Peritoneal abscess: Secondary | ICD-10-CM | POA: Diagnosis not present

## 2023-11-30 DIAGNOSIS — N179 Acute kidney failure, unspecified: Secondary | ICD-10-CM | POA: Insufficient documentation

## 2023-11-30 LAB — PROTIME-INR
INR: 1.5 — ABNORMAL HIGH (ref 0.8–1.2)
Prothrombin Time: 18.2 s — ABNORMAL HIGH (ref 11.4–15.2)

## 2023-11-30 LAB — APTT
aPTT: 37 s — ABNORMAL HIGH (ref 24–36)
aPTT: 43 s — ABNORMAL HIGH (ref 24–36)
aPTT: 41 s — ABNORMAL HIGH (ref 24–36)

## 2023-11-30 LAB — CBC
HCT: 40.4 % (ref 39.0–52.0)
Hemoglobin: 11.7 g/dL — ABNORMAL LOW (ref 13.0–17.0)
MCH: 21.1 pg — ABNORMAL LOW (ref 26.0–34.0)
MCHC: 29 g/dL — ABNORMAL LOW (ref 30.0–36.0)
MCV: 72.8 fL — ABNORMAL LOW (ref 80.0–100.0)
Platelets: 344 10*3/uL (ref 150–400)
RBC: 5.55 MIL/uL (ref 4.22–5.81)
RDW: 19.9 % — ABNORMAL HIGH (ref 11.5–15.5)
WBC: 10.3 10*3/uL (ref 4.0–10.5)
nRBC: 0 % (ref 0.0–0.2)

## 2023-11-30 LAB — CREATININE, SERUM
Creatinine, Ser: 1.56 mg/dL — ABNORMAL HIGH (ref 0.61–1.24)
GFR, Estimated: 53 mL/min — ABNORMAL LOW (ref >60)

## 2023-11-30 LAB — GLUCOSE, CAPILLARY
Glucose-Capillary: 108 mg/dL — ABNORMAL HIGH (ref 70–99)
Glucose-Capillary: 130 mg/dL — ABNORMAL HIGH (ref 70–99)
Glucose-Capillary: 139 mg/dL — ABNORMAL HIGH (ref 70–99)
Glucose-Capillary: 147 mg/dL — ABNORMAL HIGH (ref 70–99)
Glucose-Capillary: 207 mg/dL — ABNORMAL HIGH (ref 70–99)

## 2023-11-30 LAB — HEMOGLOBIN A1C
Hgb A1c MFr Bld: 10.3 % — ABNORMAL HIGH (ref 4.8–5.6)
Mean Plasma Glucose: 248.91 mg/dL

## 2023-11-30 LAB — MRSA NEXT GEN BY PCR, NASAL: MRSA by PCR Next Gen: NOT DETECTED

## 2023-11-30 LAB — PHOSPHORUS: Phosphorus: 4.3 mg/dL (ref 2.5–4.6)

## 2023-11-30 LAB — POTASSIUM
Potassium: 4.2 mmol/L (ref 3.5–5.1)
Potassium: 4.4 mmol/L (ref 3.5–5.1)

## 2023-11-30 LAB — MAGNESIUM: Magnesium: 2.1 mg/dL (ref 1.7–2.4)

## 2023-11-30 LAB — HEPARIN LEVEL (UNFRACTIONATED): Heparin Unfractionated: >1.1 [IU]/mL — ABNORMAL HIGH (ref 0.30–0.70)

## 2023-11-30 MED ORDER — ALPRAZOLAM 0.5 MG PO TABS
0.5000 mg | ORAL_TABLET | Freq: Two times a day (BID) | ORAL | Status: DC | PRN
Start: 2023-11-30 — End: 2023-12-09
  Administered 2023-12-03: 0.5 mg via ORAL
  Filled 2023-11-30: qty 1

## 2023-11-30 MED ORDER — BARIUM SULFATE 2 % PO SUSP
450.0000 mL | ORAL | Status: AC
  Administered 2023-11-30 (×2): 450 mL via ORAL

## 2023-11-30 MED ORDER — ORAL CARE MOUTH RINSE: 15.0000 mL | OROMUCOSAL | Status: DC | PRN

## 2023-11-30 MED ORDER — ORAL CARE MOUTH RINSE
15.0000 mL | OROMUCOSAL | Status: DC
  Administered 2023-11-30 – 2023-12-09 (×19): 15 mL via OROMUCOSAL

## 2023-11-30 MED ORDER — HEPARIN BOLUS VIA INFUSION
2500.0000 [IU] | Freq: Once | INTRAVENOUS | Status: AC
  Administered 2023-11-30: 2500 [IU] via INTRAVENOUS
  Filled 2023-11-30: qty 2500

## 2023-11-30 MED ORDER — SODIUM CHLORIDE 0.9 % IV SOLN: INTRAVENOUS | Status: DC

## 2023-11-30 MED ORDER — OXYCODONE HCL 5 MG PO TABS
5.0000 mg | ORAL_TABLET | ORAL | Status: DC | PRN
  Administered 2023-11-30 – 2023-12-03 (×6): 5 mg via ORAL
  Filled 2023-11-30 (×6): qty 1

## 2023-11-30 NOTE — Assessment & Plan Note (Signed)
 Cr up to 1.56 on 3/3.   - Continue IV fluids

## 2023-11-30 NOTE — Progress Notes (Signed)
   11/30/23 2300  BiPAP/CPAP/SIPAP  BiPAP/CPAP/SIPAP Pt Type Adult  BiPAP/CPAP/SIPAP Resmed  Mask Type Full face mask  Mask Size Large  FiO2 (%) 21 %  Patient Home Equipment No  Auto Titrate Yes  BiPAP/CPAP /SiPAP Vitals  Pulse Rate (!) 109  Resp 20  BP 132/71  SpO2 100 %  MEWS Score/Color  MEWS Score 1  MEWS Score Color Green

## 2023-11-30 NOTE — Assessment & Plan Note (Signed)
-   Hold home Xarelto - Continue IV heparin until surgical plan clear

## 2023-11-30 NOTE — Assessment & Plan Note (Signed)
 Continue home Xanax PRN.

## 2023-11-30 NOTE — Progress Notes (Signed)
   11/30/23 0123  BiPAP/CPAP/SIPAP  $ Non-Invasive Home Ventilator  Initial  $ Face Mask Large  Yes  BiPAP/CPAP/SIPAP Pt Type Adult  BiPAP/CPAP/SIPAP Resmed  Mask Type Full face mask  Mask Size Large  FiO2 (%) 21 %  Patient Home Equipment No  Auto Titrate Yes (4-18 cmH2O)

## 2023-11-30 NOTE — Assessment & Plan Note (Signed)
 -  Consult WOC

## 2023-11-30 NOTE — Progress Notes (Signed)
 PHARMACY - ANTICOAGULATION CONSULT NOTE  Pharmacy Consult for Heparin Indication: hx of pulmonary embolus  Allergies  Allergen Reactions   Contrast Media [Iodinated Contrast Media] Hives and Nausea Only    As a 53 year old experienced hives and nausea    Patient Measurements: Height: 5\' 10"  (177.8 cm) Weight: 88.5 kg (195 lb) IBW/kg (Calculated) : 73 Heparin Dosing Weight: TBW  Vital Signs: Temp: 98.4 F (36.9 C) (03/03 1946) Temp Source: Oral (03/03 1209) BP: 132/71 (03/03 1946) Pulse Rate: 109 (03/03 1946)  Labs: Recent Labs    11/29/23 1611 11/29/23 2300 11/30/23 0441 11/30/23 0915 11/30/23 1653  HGB 12.9*  --  11.7*  --   --   HCT 42.4  --  40.4  --   --   PLT 358  --  344  --   --   APTT  --  37*  --  43* 41*  LABPROT  --  18.2*  --   --   --   INR  --  1.5*  --   --   --   HEPARINUNFRC  --  >1.10*  --   --   --   CREATININE 1.10  --  1.56*  --   --     Estimated Creatinine Clearance: 62.1 mL/min (A) (by C-G formula based on SCr of 1.56 mg/dL (H)).  Medications:  Xarelto 20mg  po daily - LD 3/1 @ 2345  Assessment: 76 yoM admitted with chronic pelvic abscess due to rectal stump disruption with perforation/intraperitoneal abscess currently being treated with antibiotics. He is on chronic Xarelto for hx recurrent VTE which is being held just in case he ends up requiring surgical intervention.  Baseline labs: Hg slightly low but appears to be pt's baseline, pltc WNL, Scr WNL. Coag labs pending.  Today, 11/30/23 aPTT remains subtherapeutic and essentially unchanged, despite increasing heparin rate to 1400 units/hr (~17% increase) Hgb 11.7 plt 344 No line or bleeding issues per RN    Goal of Therapy:  Heparin level 0.3-0.7 units/ml aPTT 66-102 seconds Monitor platelets by anticoagulation protocol: Yes   Plan:  Repeat heparin 2500 unit bolus followed by increasing IV heparin to 1650 units/hr.   Check 8h aPTT after rate change  Daily heparin level/aPTT and  CBC while on heparin Monitor heparin using aPTT until correlates with heparin level.  Bernadene Person, PharmD, BCPS [phone] 11/30/2023, 7:56 PM

## 2023-11-30 NOTE — Assessment & Plan Note (Signed)
 Glucoses improving - Continue sliding scale corrections - Hold home [place]

## 2023-11-30 NOTE — Plan of Care (Signed)

## 2023-11-30 NOTE — Hospital Course (Signed)
 53 y.o. M with hx rectal CA s/p LAR in 2022 w/ diverting ileostomy c/b post-op hematoma that eventually got infected and developed a chronic fluid collection in the pelvis, also hx PE on Xarelto who presented with increased pelvic pain.  CT showed inflamed small bowel, air in the presacral fluid collection.  General Surgery were consulted and he was admitted on fluids and antibiotics.

## 2023-11-30 NOTE — Progress Notes (Signed)
 PHARMACY - ANTICOAGULATION CONSULT NOTE  Pharmacy Consult for Heparin Indication: hx of pulmonary embolus  Allergies  Allergen Reactions   Contrast Media [Iodinated Contrast Media] Hives and Nausea Only    As a 53 year old experienced hives and nausea    Patient Measurements: Height: 5\' 10"  (177.8 cm) Weight: 88.5 kg (195 lb) IBW/kg (Calculated) : 73 Heparin Dosing Weight: TBW  Vital Signs: Temp: 98.2 F (36.8 C) (03/03 0823) Temp Source: Oral (03/03 0823) BP: 119/72 (03/03 0823) Pulse Rate: 105 (03/03 0823)  Labs: Recent Labs    11/29/23 1611 11/29/23 2300 11/30/23 0441 11/30/23 0915  HGB 12.9*  --  11.7*  --   HCT 42.4  --  40.4  --   PLT 358  --  344  --   APTT  --  37*  --  43*  LABPROT  --  18.2*  --   --   INR  --  1.5*  --   --   HEPARINUNFRC  --  >1.10*  --   --   CREATININE 1.10  --  1.56*  --     Estimated Creatinine Clearance: 62.1 mL/min (A) (by C-G formula based on SCr of 1.56 mg/dL (H)).   Medical History: Past Medical History:  Diagnosis Date   Anemia 02/2021   iron   Anxiety    Asthma    Bilateral pulmonary embolism (HCC) 04/24/2019   Dehiscence of anastomosis of large intestine 12/31/2022   Depression    Diabetes mellitus without complication (HCC)    History of kidney stones    HLD (hyperlipidemia)    Hypertension    Kidney stones    Neuromuscular disorder (HCC)    from radiation bi lat   OSA (obstructive sleep apnea)    CPAP   Peripheral vascular disease (HCC)    DVT   rectal ca 02/2021   Sleep apnea     Medications:  -Xarelto 20mg  po daily - LD 3/1 @ 2345  Assessment: 37 yoM admitted with chronic pelvic abscess due to rectal stump disruption with perforation/intraperitoneal abscess currently being treated with antibiotics.  He is on chronic Xarelto for hx recurrent VTE which is being held just in case he ends up requiring surgical intervention.  Baseline labs: Hg slightly low but appears to be pt's baseline, pltc WNL, Scr  WNL. Coag labs pending.  Today, 11/30/23 aPTT is 43 seconds this morning, subtherapeutic  Hgb 11.7 plt 344 Monitor heparin using aPTT until correlates with heparin level. No line or bleeding issues per RN    Goal of Therapy:  Heparin level 0.3-0.7 units/ml aPTT 66-102 seconds Monitor platelets by anticoagulation protocol: Yes   Plan:  Heparin 2500 unit bolus followed by increasing  IV heparin to 1400 units/hr.   Check 8h aPTT after rate change  Daily heparin level/aPTT and CBC while on heparin   Adalberto Cole, PharmD, BCPS 11/30/2023 10:15 AM

## 2023-11-30 NOTE — Assessment & Plan Note (Signed)
 Abdominal pain, nausea and vomiting He has a complex history of pelvic fluid collection after his colectomy in 2022.  At the moment, we are uncertain if this is a partial SBO, abscess, perforation, or fistulization.  CT to delineate this question is pending; my understanding is that steroid pre-treatment is warranted before oral contrast (but I defer to Radiology department protocol if they have more extensive experience with this, I do not). - Defer management to Surgery - Continue Zosyn - No need for vancomycin - IV fluids - Bowel rest - Follow CT results

## 2023-11-30 NOTE — Progress Notes (Signed)
 [facility] Progress Note     Subjective: CC:  Reports onset of cramping abdominal pain at 0900 on Sunday 3/2. Had 2 episodes of vomiting. Denies gas/stool output from ostomy from 0900 Sunday until this morning, so about 24 hours. This morning states his abdominal pain is improved but he is having some muscular pain of his back and chest wall.  Objective: Vital signs in last 24 hours: Temp:  [98.2 F (36.8 C)-99 F (37.2 C)] 98.5 F (36.9 C) (03/03 1209) Pulse Rate:  [94-107] 94 (03/03 1209) Resp:  [16-19] 19 (03/03 1209) BP: (115-131)/(61-80) 115/70 (03/03 1209) SpO2:  [94 %-100 %] 96 % (03/03 1209) FiO2 (%):  [21 %] 21 % (03/03 0123) Weight:  [88.5 kg] 88.5 kg (03/02 1502) Last BM Date : 11/29/23  Intake/Output from previous day: 03/02 0701 - 03/03 0700 In: 55.3 [I.V.:26.3; IV Piggyback:29] Out: 400 [Urine:400] Intake/Output this shift: Total I/O In: 93 [P.O.:30; I.V.:63] Out: 400 [Urine:400]  PE: Gen:  Alert, NAD, pleasant Card:  Regular rate and rhythm Pulm:  Normal effort ORA Abd: Soft, non-tender, non-distended, RLQ ostomy with soft parastomal hernia. Ostomy pouch with gas and small amt liquid stool present. Skin: warm and dry, no rashes  Psych: A&Ox3   Lab Results:  Recent Labs    11/29/23 1611 11/30/23 0441  WBC 14.4* 10.3  HGB 12.9* 11.7*  HCT 42.4 40.4  PLT 358 344   BMET Recent Labs    11/29/23 1611 11/29/23 2300 11/30/23 0441  NA 139  --   --   K 4.2 4.2 4.4  CL 105  --   --   CO2 20*  --   --   GLUCOSE 298*  --   --   BUN 23*  --   --   CREATININE 1.10  --  1.56*  CALCIUM 9.3  --   --    PT/INR Recent Labs    11/29/23 2300  LABPROT 18.2*  INR 1.5*   CMP     Component Value Date/Time   NA 139 11/29/2023 1611   K 4.4 11/30/2023 0441   CL 105 11/29/2023 1611   CO2 20 (L) 11/29/2023 1611   GLUCOSE 298 (H) 11/29/2023 1611   BUN 23 (H) 11/29/2023 1611   CREATININE 1.56 (H) 11/30/2023 0441   CREATININE 1.09 10/27/2023  1014   CALCIUM 9.3 11/29/2023 1611   PROT 7.7 11/29/2023 1611   ALBUMIN 3.8 11/29/2023 1611   AST 14 (L) 11/29/2023 1611   AST 11 (L) 10/27/2023 1014   ALT 13 11/29/2023 1611   ALT 11 10/27/2023 1014   ALKPHOS 59 11/29/2023 1611   BILITOT 1.0 11/29/2023 1611   BILITOT 0.5 10/27/2023 1014   GFRNONAA 53 (L) 11/30/2023 0441   GFRNONAA >60 10/27/2023 1014   GFRAA >60 04/25/2019 0255   Lipase     Component Value Date/Time   LIPASE 38 11/29/2023 1611       Studies/Results: CT CHEST ABDOMEN PELVIS W CONTRAST Result Date: 11/29/2023 CLINICAL DATA:  Nausea and vomiting with lower abdominal pain, history of rectal carcinoma EXAM: CT CHEST, ABDOMEN, AND PELVIS WITH CONTRAST TECHNIQUE: Multidetector CT imaging of the chest, abdomen and pelvis was performed following the standard protocol during bolus administration of intravenous contrast. RADIATION DOSE REDUCTION: This exam was performed according to the departmental dose-optimization program which includes automated exposure control, adjustment of the mA and/or kV according to patient size and/or use of iterative reconstruction technique. CONTRAST:  OMNIPAQUE IOHEXOL 300 MG/ML  SOLN. Premedication was given for known contrast allergy. COMPARISON:  03/09/2023 FINDINGS: CT CHEST FINDINGS Cardiovascular: Thoracic aorta is within normal limits. No cardiac enlargement is seen. Pulmonary artery as visualized is within normal limits. Mediastinum/Nodes: Thoracic inlet is unremarkable. No hilar or mediastinal adenopathy is noted. The esophagus as visualized is within normal limits. Lungs/Pleura: The lungs are well aerated bilaterally. No focal infiltrate or sizable effusion is seen. No parenchymal nodules are noted. Musculoskeletal: No acute bony abnormality is noted. CT ABDOMEN PELVIS FINDINGS Hepatobiliary: No focal liver abnormality is seen. No gallstones, gallbladder wall thickening, or biliary dilatation. Pancreas: Unremarkable. No pancreatic ductal  dilatation or surrounding inflammatory changes. Spleen: Normal in size without focal abnormality. Adrenals/Urinary Tract: Adrenal glands are within normal limits. Kidneys demonstrate a normal enhancement pattern bilaterally. Tiny nonobstructing left upper pole renal stone is seen unchanged from the prior study. No obstructive changes are seen. The bladder is within normal limits. Stomach/Bowel: There are changes consistent with a right lower quadrant colostomy. The colon proximal to this is well visualized without inflammatory change. The appendix has been surgically removed. Stomach is within normal limits. Herniation of multiple small bowel loops adjacent to the colostomy are seen. Multiple inflamed loops of small bowel are identified matted within the lower pelvis distal to the herniated bowel loops at the level of the colostomy. Considerable wall thickening is noted and there is inflammatory change and multiple extraluminal foci of air consistent with perforation. Considerable mesenteric inflammatory changes are noted as well. There is suggestion of an early extraluminal air fluid collection which measures approximately 4.2 x 3.1 cm. This is best seen on image number 106 of series 2. Postsurgical changes are noted with an ileal to ileal anastomosis which appears widely patent. Fecalized bowel content is noted in this region. This is distal to the matted inflamed loops of small bowel in the pelvis. The matted loops within the pelvis lie adjacent to considerable presacral soft tissue density similar to that seen on the prior exam as well as a an air-fluid collection in the presacral region. This air-fluid collection previously was noted to be bland fluid and the air within now suggests extension of the inflammatory change into this collection. Vascular/Lymphatic: No significant vascular findings are present. No enlarged abdominal or pelvic lymph nodes. Reproductive: Prostate is unremarkable. Other: Scattered free  air throughout the abdomen consistent with the known perforation and inflammatory change within the pelvis. Musculoskeletal: No acute or significant osseous findings. IMPRESSION: Significantly inflamed loops of distal jejunum/proximal ileum matted within the lower pelvis adjacent to a previously seen presacral fluid collection. Air is now noted in that presacral fluid collection consistent with extension of local infection and there is considerable extraluminal air and fluid identified adjacent to these loops consistent with developing abscess and perforation. Surgical consultation is recommended. Multiple herniated loops of small bowel adjacent to the end colostomy in the right lower quadrant. These occur proximal to the areas of inflammation within the more distal small bowel. Tiny nonobstructing left renal stone. No acute abnormality in the chest. Electronically Signed   By: Alcide Clever M.D.   On: 11/29/2023 21:00    Anti-infectives: Anti-infectives (From admission, onward)    Start     Dose/Rate Route Frequency Ordered Stop   11/29/23 2200  piperacillin-tazobactam (ZOSYN) IVPB 3.375 g        3.375 g 12.5 mL/hr over 240 Minutes Intravenous Every 8 hours 11/29/23 2147 12/04/23 2159   11/29/23 2130  metroNIDAZOLE (FLAGYL) IVPB 500 mg  Status:  Discontinued        500 mg 100 mL/hr over 60 Minutes Intravenous  Once 11/29/23 2119 11/29/23 2147   11/29/23 2130  ceFEPIme (MAXIPIME) 2 g in sodium chloride 0.9 % 100 mL IVPB  Status:  Discontinued        2 g 200 mL/hr over 30 Minutes Intravenous Every 24 hours 11/29/23 2126 11/29/23 2147        Assessment/Plan 53 y/o M w/ [id number] rectal adenocarcinoma diagnosed 2022 S/p chemoradiation 7-04/2021 S/p Robotic LAR, diverting ileostomy 07/17/2021 Dr. Maisie Fus, complicated by anterior midline anastomotic dehiscence - managed with ileostomy already in place, abx S/p chemotherapy with Xeloda S/p EUA, debridement anterior staple line/sigmoidoscopy 03/2022  (findings: R and L anterior fistula communicating at midline) S/p LAR with end colostomy, takedown of ileostomy 12/2022 due to persistent fistula. Has a rectal stump leak and chronic presacral collection   Now presenting with acute onset abdominal pain, nausea, vomiting  - afebrile, WBC 10 from 14 yesterday - CT abdomen pelvis on admission w/ inflammatory changes of the distal jejum/proximal ileum in the RLQ/pelvis near chronic pre-sacral fluid collection. The presacral collection does contain air now. There are small foci of free air near the inflamed small bowel, concerning for perforation. - clinically the patient is having less abdominal pain today, he is hemodynamically stable, abd exam is benign, and he is having flatus via colostomy. No emergent role for surgery. Recommend CT with PO contrast to better identify possible pSBO with perforation and exclude small bowel perforation or fistulization to the pre-sacral collection.   FEN: NPO, IVF per primary ID: Zosyn VTE: SCD's. Hep gtt for hx PE Foley: none Dispo: TR[facility] service, floor level of care   PE 2022 - this prompted his malignany workup, on xarelto at baseline OSA DM   LOS: 1 day   I reviewed nursing notes, ED provider notes, hospitalist notes, last 24 h vitals and pain scores, last 48 h intake and output, last 24 h labs and trends, and last 24 h imaging results.  This care required moderate level of medical decision making.   Hosie Spangle, PA-C [facility] Please see Amion for pager number during day hours 7:00am-4:30pm

## 2023-11-30 NOTE — Progress Notes (Addendum)
  Progress Note   Patient: Steven [name] TFT:[id number] DOB: [birth year] DOA: 11/29/2023     1 DOS: the patient was seen and examined on 11/30/2023 at 11:40AM      Brief hospital course: 53 y.o. M with hx rectal CA s/p LAR in 2022 w/ diverting ileostomy c/b post-op hematoma that eventually got infected and developed a chronic fluid collection in the pelvis, also hx PE on Xarelto who presented with increased pelvic pain.  CT showed inflamed small bowel, air in the presacral fluid collection.  General Surgery were consulted and he was admitted on fluids and antibiotics.     Assessment and Plan: * Chronic pelvic abscess due to rectal stump disruption Abdominal pain, nausea and vomiting He has a complex history of pelvic fluid collection after his colectomy in 2022.  At the moment, we are uncertain if this is a partial SBO, abscess, perforation, or fistulization.  CT to delineate this question is pending  - Defer management to Surgery - Continue Zosyn - No need for vancomycin - IV fluids - Bowel rest - Follow CT results    History of pulmonary embolus (PE) - Hold home Xarelto - Continue IV heparin until surgical plan clear  Colostomy in place [facility]) - Consult WOC  Uncontrolled diabetes mellitus with hyperglycemia (HCC) Glucoses improving - Continue sliding scale corrections - Hold home Farxiga  OSA (obstructive sleep apnea) - CPAP noct  Anxiety - Continue home Xanax PRN  Asthma No evidence of flare - Continue PRN home albuterol  AKI Baseline creatinine 0.9, up to 1.56 this morning. - Start IV fluids - Trend creatinine  History of IV contrast reaction Review by Radiology shows that the patient has tolerated scans with IV contrast in the past without steroid prep.  Current plan is for oral contrast only, with diphenhydramine as needed      Subjective: Patient with cramps from vomiting repeatedly.  Still with pain with movement.  No fever, no  confusion.     Physical Exam: BP 115/70 (BP Location: Right Arm)   Pulse 94   Temp 98.5 F (36.9 C) (Oral)   Resp 19   Ht 5\' 10"  (1.778 m)   Wt 88.5 kg   SpO2 96%   BMI 27.98 kg/m   Adult male, sitting up in bed, interactive and appropriate RRR, no murmurs, no peripheral edema Respiratory normal, lungs clear without rales or wheezes Abdomen with tenderness in the lower aspects, no significant guarding, no rigidity or rebound, colostomy in place Attention normal, affect appropriate, judgment and insight appear normal    Data Reviewed: Case discussed with general surgery Basic metabolic panel shows creatinine up to 1.56, potassium normal White count is improved Hemoglobin is 11.7 and microcytic INR 1.5 Hemoglobin A1c 10.3% Glucose elevated    Family Communication: Wife at the bedside    Disposition: Status is: Inpatient The patient was admitted for abdominal pain and vomiting.  He has a chronic fluid collection in his pelvis, and general surgery at the moment are uncertain about if this is an abscess, perforation, fistula, or what.  CT will better delineate this, in the meantime he is on IV antibiotics and bowel rest        Author: Alberteen Sam, MD 11/30/2023 2:33 PM  For on call review [email or web address].

## 2023-11-30 NOTE — Progress Notes (Signed)
 Orders in place to flush NG tube. No NG tube is in place. Per notes. Patient would like to wait regarding NG tube placement. The writer reached out to physician this AM for orders for ICE chips. N.O. put in place by physician, with GI Surgical team confirmation.  Ok to have ice chips and/or sips of water with medication.. Tolerated well.

## 2023-11-30 NOTE — Assessment & Plan Note (Signed)
 No evidence of flare - Continue PRN home albuterol

## 2023-11-30 NOTE — Assessment & Plan Note (Signed)
-   CPAP noct

## 2023-12-01 ENCOUNTER — Telehealth: Payer: Self-pay

## 2023-12-01 ENCOUNTER — Other Ambulatory Visit: Payer: Self-pay

## 2023-12-01 DIAGNOSIS — K651 Peritoneal abscess: Secondary | ICD-10-CM | POA: Diagnosis not present

## 2023-12-01 DIAGNOSIS — K6389 Other specified diseases of intestine: Secondary | ICD-10-CM

## 2023-12-01 LAB — CBC
HCT: 36.6 % — ABNORMAL LOW (ref 39.0–52.0)
Hemoglobin: 10.4 g/dL — ABNORMAL LOW (ref 13.0–17.0)
MCH: 21 pg — ABNORMAL LOW (ref 26.0–34.0)
MCHC: 28.4 g/dL — ABNORMAL LOW (ref 30.0–36.0)
MCV: 73.8 fL — ABNORMAL LOW (ref 80.0–100.0)
Platelets: 301 10*3/uL (ref 150–400)
RBC: 4.96 MIL/uL (ref 4.22–5.81)
RDW: 19.4 % — ABNORMAL HIGH (ref 11.5–15.5)
WBC: 10 10*3/uL (ref 4.0–10.5)
nRBC: 0 % (ref 0.0–0.2)

## 2023-12-01 LAB — BASIC METABOLIC PANEL
Anion gap: 16 — ABNORMAL HIGH (ref 5–15)
BUN: 31 mg/dL — ABNORMAL HIGH (ref 6–20)
CO2: 18 mmol/L — ABNORMAL LOW (ref 22–32)
Calcium: 8.1 mg/dL — ABNORMAL LOW (ref 8.9–10.3)
Chloride: 103 mmol/L (ref 98–111)
Creatinine, Ser: 1.4 mg/dL — ABNORMAL HIGH (ref 0.61–1.24)
GFR, Estimated: >60 mL/min (ref >60)
Glucose, Bld: 125 mg/dL — ABNORMAL HIGH (ref 70–99)
Potassium: 3.9 mmol/L (ref 3.5–5.1)
Sodium: 137 mmol/L (ref 135–145)

## 2023-12-01 LAB — GLUCOSE, CAPILLARY
Glucose-Capillary: 126 mg/dL — ABNORMAL HIGH (ref 70–99)
Glucose-Capillary: 98 mg/dL (ref 70–99)
Glucose-Capillary: 102 mg/dL — ABNORMAL HIGH (ref 70–99)
Glucose-Capillary: 128 mg/dL — ABNORMAL HIGH (ref 70–99)
Glucose-Capillary: 143 mg/dL — ABNORMAL HIGH (ref 70–99)

## 2023-12-01 LAB — HEPARIN LEVEL (UNFRACTIONATED)
Heparin Unfractionated: 0.13 [IU]/mL — ABNORMAL LOW (ref 0.30–0.70)
Heparin Unfractionated: 0.13 [IU]/mL — ABNORMAL LOW (ref 0.30–0.70)
Heparin Unfractionated: <0.1 [IU]/mL — ABNORMAL LOW (ref 0.30–0.70)

## 2023-12-01 LAB — APTT
aPTT: 58 s — ABNORMAL HIGH (ref 24–36)
aPTT: 48 s — ABNORMAL HIGH (ref 24–36)

## 2023-12-01 MED ORDER — HEPARIN BOLUS VIA INFUSION
2500.0000 [IU] | Freq: Once | INTRAVENOUS | Status: AC
  Administered 2023-12-01: 2500 [IU] via INTRAVENOUS
  Filled 2023-12-01: qty 2500

## 2023-12-01 MED ORDER — SUFLAVE 178.7 G PO SOLR: 1.0000 | Freq: Once | ORAL | 0 refills | Status: DC

## 2023-12-01 MED ORDER — SODIUM CHLORIDE 0.9 % IV SOLN: INTRAVENOUS | Status: DC

## 2023-12-01 NOTE — Plan of Care (Signed)
 Problem: Education: Goal: Ability to describe self-care measures that may prevent or decrease complications (Diabetes Survival Skills Education) will improve 12/01/2023 1910 by Arcelia Jew, RN Outcome: Progressing 12/01/2023 1910 by Arcelia Jew, RN Outcome: Progressing Goal: Individualized Educational Video(s) 12/01/2023 1910 by Arcelia Jew, RN Outcome: Progressing 12/01/2023 1910 by Arcelia Jew, RN Outcome: Adequate for Discharge   Problem: Coping: Goal: Ability to adjust to condition or change in health will improve 12/01/2023 1910 by Arcelia Jew, RN Outcome: Progressing 12/01/2023 1910 by Arcelia Jew, RN Outcome: Adequate for Discharge   Problem: Fluid Volume: Goal: Ability to maintain a balanced intake and output will improve 12/01/2023 1910 by Arcelia Jew, RN Outcome: Progressing 12/01/2023 1910 by Arcelia Jew, RN Outcome: Adequate for Discharge   Problem: Health Behavior/Discharge Planning: Goal: Ability to identify and utilize available resources and services will improve 12/01/2023 1910 by Arcelia Jew, RN Outcome: Progressing 12/01/2023 1910 by Arcelia Jew, RN Outcome: Adequate for Discharge Goal: Ability to manage health-related needs will improve 12/01/2023 1910 by Arcelia Jew, RN Outcome: Progressing 12/01/2023 1910 by Arcelia Jew, RN Outcome: Adequate for Discharge   Problem: Metabolic: Goal: Ability to maintain appropriate glucose levels will improve 12/01/2023 1910 by Arcelia Jew, RN Outcome: Progressing 12/01/2023 1910 by Arcelia Jew, RN Outcome: Adequate for Discharge   Problem: Nutritional: Goal: Maintenance of adequate nutrition will improve 12/01/2023 1910 by Arcelia Jew, RN Outcome: Progressing 12/01/2023 1910 by Arcelia Jew, RN Outcome: Adequate for Discharge Goal: Progress toward achieving an optimal weight will improve 12/01/2023 1910 by Arcelia Jew, RN Outcome: Progressing 12/01/2023 1910 by Arcelia Jew, RN Outcome: Adequate for Discharge   Problem: Skin Integrity: Goal: Risk for impaired skin integrity will decrease 12/01/2023 1910 by Arcelia Jew, RN Outcome: Progressing 12/01/2023 1910 by Arcelia Jew, RN Outcome: Adequate for Discharge   Problem: Tissue Perfusion: Goal: Adequacy of tissue perfusion will improve 12/01/2023 1910 by Arcelia Jew, RN Outcome: Progressing 12/01/2023 1910 by Arcelia Jew, RN Outcome: Adequate for Discharge   Problem: Education: Goal: Knowledge of General Education information will improve Description: Including pain rating scale, medication(s)/side effects and non-pharmacologic comfort measures 12/01/2023 1910 by Arcelia Jew, RN Outcome: Progressing 12/01/2023 1910 by Arcelia Jew, RN Outcome: Adequate for Discharge   Problem: Health Behavior/Discharge Planning: Goal: Ability to manage health-related needs will improve 12/01/2023 1910 by Arcelia Jew, RN Outcome: Progressing 12/01/2023 1910 by Arcelia Jew, RN Outcome: Adequate for Discharge   Problem: Clinical Measurements: Goal: Ability to maintain clinical measurements within normal limits will improve 12/01/2023 1910 by Arcelia Jew, RN Outcome: Progressing 12/01/2023 1910 by Arcelia Jew, RN Outcome: Adequate for Discharge Goal: Will remain free from infection 12/01/2023 1910 by Arcelia Jew, RN Outcome: Progressing 12/01/2023 1910 by Arcelia Jew, RN Outcome: Adequate for Discharge Goal: Diagnostic test results will improve 12/01/2023 1910 by Arcelia Jew, RN Outcome: Progressing 12/01/2023 1910 by Arcelia Jew, RN Outcome: Adequate for Discharge Goal: Respiratory complications will improve 12/01/2023 1910 by Arcelia Jew, RN Outcome: Progressing 12/01/2023 1910 by Arcelia Jew, RN Outcome: Adequate for Discharge Goal: Cardiovascular complication will be avoided 12/01/2023 1910 by Arcelia Jew, RN Outcome: Progressing 12/01/2023 1910 by Arcelia Jew,  RN Outcome: Adequate for Discharge   Problem: Activity: Goal: Risk for activity intolerance will decrease 12/01/2023 1910 by Arcelia Jew, RN Outcome: Progressing 12/01/2023 1910 by Chancy Hurter,  Andy Gauss, RN Outcome: Adequate for Discharge   Problem: Nutrition: Goal: Adequate nutrition will be maintained 12/01/2023 1910 by Arcelia Jew, RN Outcome: Progressing 12/01/2023 1910 by Arcelia Jew, RN Outcome: Adequate for Discharge   Problem: Coping: Goal: Level of anxiety will decrease 12/01/2023 1910 by Arcelia Jew, RN Outcome: Progressing 12/01/2023 1910 by Arcelia Jew, RN Outcome: Adequate for Discharge   Problem: Elimination: Goal: Will not experience complications related to bowel motility 12/01/2023 1910 by Arcelia Jew, RN Outcome: Progressing 12/01/2023 1910 by Arcelia Jew, RN Outcome: Adequate for Discharge Goal: Will not experience complications related to urinary retention 12/01/2023 1910 by Arcelia Jew, RN Outcome: Progressing 12/01/2023 1910 by Arcelia Jew, RN Outcome: Adequate for Discharge   Problem: Pain Managment: Goal: General experience of comfort will improve and/or be controlled 12/01/2023 1910 by Arcelia Jew, RN Outcome: Progressing 12/01/2023 1910 by Arcelia Jew, RN Outcome: Adequate for Discharge   Problem: Safety: Goal: Ability to remain free from injury will improve 12/01/2023 1910 by Arcelia Jew, RN Outcome: Progressing 12/01/2023 1910 by Arcelia Jew, RN Outcome: Adequate for Discharge   Problem: Skin Integrity: Goal: Risk for impaired skin integrity will decrease 12/01/2023 1910 by Arcelia Jew, RN Outcome: Progressing 12/01/2023 1910 by Arcelia Jew, RN Outcome: Adequate for Discharge

## 2023-12-01 NOTE — Progress Notes (Signed)
 PHARMACY - ANTICOAGULATION CONSULT NOTE  Pharmacy Consult for Heparin Indication: hx of pulmonary embolus  Allergies  Allergen Reactions   Contrast Media [Iodinated Contrast Media] Hives and Nausea Only    As a 53 year old experienced hives and nausea    Patient Measurements: Height: 5\' 10"  (177.8 cm) Weight: 88.5 kg (195 lb) IBW/kg (Calculated) : 73 Heparin Dosing Weight: TBW  Vital Signs: Temp: 98.4 F (36.9 C) (03/04 1301) Temp Source: Oral (03/04 1301) BP: 118/74 (03/04 1301) Pulse Rate: 99 (03/04 1301)  Labs: Recent Labs    11/29/23 1611 11/29/23 2300 11/30/23 0441 11/30/23 0915 11/30/23 1653 12/01/23 0501 12/01/23 1240  HGB 12.9*  --  11.7*  --   --  10.4*  --   HCT 42.4  --  40.4  --   --  36.6*  --   PLT 358  --  344  --   --  301  --   APTT  --  37*  --    < > 41* 58* 48*  LABPROT  --  18.2*  --   --   --   --   --   INR  --  1.5*  --   --   --   --   --   HEPARINUNFRC  --  >1.10*  --   --   --  0.13* 0.13*  CREATININE 1.10  --  1.56*  --   --  1.40*  --    < > = values in this interval not displayed.    Estimated Creatinine Clearance: 69.1 mL/min (A) (by C-G formula based on SCr of 1.4 mg/dL (H)).  Medications:  Xarelto 20mg  po daily - LD 3/1 @ 2345  Assessment: 52 yoM admitted with chronic pelvic abscess due to rectal stump disruption with perforation/intraperitoneal abscess currently being treated with antibiotics. He is on chronic Xarelto for hx recurrent VTE which is being held just in case he ends up requiring surgical intervention.  Baseline labs: Hg slightly low but appears to be pt's baseline, pltc WNL, Scr WNL. Coag labs pending.  Today, 12/01/23 aPTT at 48 sec  and HL of 0.13 are both subtherapeutic Hgb 10.4, plts 301 No line or bleeding issues noted per RN    Goal of Therapy:  Heparin level 0.3-0.7 units/ml aPTT 66-102 seconds Monitor platelets by anticoagulation protocol: Yes   Plan:  Repeat heparin 2500 unit bolus followed by  increasing IV heparin to 2100 units/hr.   Check 6h HL after rate change  Daily heparin level and CBC while on heparin Will monitor heparin using HL as aPTT and HL are correlating    Adalberto Cole, PharmD, BCPS 12/01/2023 1:40 PM

## 2023-12-01 NOTE — Progress Notes (Signed)
 [facility] Progress Note     Subjective: CC:  Overall feels better. Reports less drainage from his rectum, orange in color. Having BMs.  Objective: Vital signs in last 24 hours: Temp:  [98.4 F (36.9 C)-99.2 F (37.3 C)] 98.9 F (37.2 C) (03/04 0407) Pulse Rate:  [94-109] 105 (03/04 0407) Resp:  [18-20] 19 (03/04 0407) BP: (115-132)/(68-74) 123/68 (03/04 0407) SpO2:  [96 %-100 %] 97 % (03/04 0407) FiO2 (%):  [21 %] 21 % (03/03 2300) Last BM Date : 11/30/23  Intake/Output from previous day: 03/03 0701 - 03/04 0700 In: 1981.2 [P.O.:90; I.V.:1837.2; IV Piggyback:54] Out: 400 [Urine:400] Intake/Output this shift: No intake/output data recorded.  PE: Gen:  Alert, NAD, pleasant Card:  Regular rate and rhythm Pulm:  Normal effort Abd: Soft, non-tender, non-distended, bowel sounds present in all 4 quadrants, parastomal hernia is soft. Gas/stool in pouch.  Skin: warm and dry, no rashes  Psych: A&Ox3   Lab Results:  Recent Labs    11/30/23 0441 12/01/23 0501  WBC 10.3 10.0  HGB 11.7* 10.4*  HCT 40.4 36.6*  PLT 344 301   BMET Recent Labs    11/29/23 1611 11/29/23 2300 11/30/23 0441 12/01/23 0501  NA 139  --   --  137  K 4.2   < > 4.4 3.9  CL 105  --   --  103  CO2 20*  --   --  18*  GLUCOSE 298*  --   --  125*  BUN 23*  --   --  31*  CREATININE 1.10  --  1.56* 1.40*  CALCIUM 9.3  --   --  8.1*   < > = values in this interval not displayed.   PT/INR Recent Labs    11/29/23 2300  LABPROT 18.2*  INR 1.5*   CMP     Component Value Date/Time   NA 137 12/01/2023 0501   K 3.9 12/01/2023 0501   CL 103 12/01/2023 0501   CO2 18 (L) 12/01/2023 0501   GLUCOSE 125 (H) 12/01/2023 0501   BUN 31 (H) 12/01/2023 0501   CREATININE 1.40 (H) 12/01/2023 0501   CREATININE 1.09 10/27/2023 1014   CALCIUM 8.1 (L) 12/01/2023 0501   PROT 7.7 11/29/2023 1611   ALBUMIN 3.8 11/29/2023 1611   AST 14 (L) 11/29/2023 1611   AST 11 (L) 10/27/2023 1014   ALT 13  11/29/2023 1611   ALT 11 10/27/2023 1014   ALKPHOS 59 11/29/2023 1611   BILITOT 1.0 11/29/2023 1611   BILITOT 0.5 10/27/2023 1014   GFRNONAA >60 12/01/2023 0501   GFRNONAA >60 10/27/2023 1014   GFRAA >60 04/25/2019 0255   Lipase     Component Value Date/Time   LIPASE 38 11/29/2023 1611       Studies/Results: CT ABDOMEN PELVIS WO CONTRAST Result Date: 12/01/2023 CLINICAL DATA:  Postop assess for obstruction or perforation EXAM: CT ABDOMEN AND PELVIS WITHOUT CONTRAST TECHNIQUE: Multidetector CT imaging of the abdomen and pelvis was performed following the standard protocol without IV contrast. RADIATION DOSE REDUCTION: This exam was performed according to the departmental dose-optimization program which includes automated exposure control, adjustment of the mA and/or kV according to patient size and/or use of iterative reconstruction technique. COMPARISON:  CT 11/29/2023, 03/09/2023, 01/12/2023 and exams dating back to 11/23/2020 FINDINGS: Lower chest: Lung bases demonstrate dependent atelectasis. Normal cardiac size. Hepatobiliary: Hyperdense sludge versus vicarious contrast in the gallbladder. No biliary dilatation. Pancreas: Unremarkable. No pancreatic ductal dilatation or surrounding inflammatory changes. Spleen:  Normal in size without focal abnormality. Adrenals/Urinary Tract: Adrenal glands are normal. Kidneys show no hydronephrosis. Small nonobstructing left kidney stone. Hyperdense material in the bladder probably dilute contrast. Diffuse bladder wall thickening. Stomach/Bowel: Stomach is nonenlarged. No dilated small bowel. Enteral contrast within the colon and distal small bowel. Status post low anterior resection with right abdominal colostomy containing enteral contrast. Parastomal hernia containing fat and small bowel. No obstruction. Contrast containing thickened small bowel in the low pelvis, inseparable from chronic presacral soft tissue abnormality. Extraluminal gas collection  adjacent to the small bowel on series 2, image 79, tracks superiorly and is contiguous with gas and fluid collection that has increased compared with the exam yesterday. This collection measures 7.7 x 3.6 cm on series 2, image 68. Additional gas and fluid collection tracking into the left pelvis on series 2, image 78. Chronic gas and fluid collection within the presacral soft tissue thickening which is better delineated with intravenous contrast. Vascular/Lymphatic: Nonaneurysmal aorta.  No enlarging lymph node. Reproductive: State negative prostate Other: Multiple locules of free intraperitoneal gas as seen on the recent CT. Stranding and fluid within the pelvic mesentery. Musculoskeletal: Mild erosive and sclerotic changes along the anterior sacrum which are unchanged. IMPRESSION: 1. Since the examination performed yesterday, increased extraluminal gas and fluid collection within the central pelvis, communicating with irregular gas collections tracking towards thickened small bowel in the low pelvis which cannot be separated from the chronic presacral soft tissue abnormality. Gas and fluid tracks to the left pelvis as well. Findings could be secondary to viscus perforation versus fistula. No obvious extravasated or extraluminal contrast in the pelvis. Elsewhere scattered foci of free intraperitoneal gas, grossly unchanged compared with CT performed yesterday 2. Right abdominal colostomy with parastomal hernia containing fat and small bowel but no obstructive features. Electronically Signed   By: Jasmine Pang M.D.   On: 12/01/2023 00:08   CT CHEST ABDOMEN PELVIS W CONTRAST Result Date: 11/29/2023 CLINICAL DATA:  Nausea and vomiting with lower abdominal pain, history of rectal carcinoma EXAM: CT CHEST, ABDOMEN, AND PELVIS WITH CONTRAST TECHNIQUE: Multidetector CT imaging of the chest, abdomen and pelvis was performed following the standard protocol during bolus administration of intravenous contrast. RADIATION  DOSE REDUCTION: This exam was performed according to the departmental dose-optimization program which includes automated exposure control, adjustment of the mA and/or kV according to patient size and/or use of iterative reconstruction technique. CONTRAST:  OMNIPAQUE IOHEXOL 300 MG/ML SOLN. Premedication was given for known contrast allergy. COMPARISON:  03/09/2023 FINDINGS: CT CHEST FINDINGS Cardiovascular: Thoracic aorta is within normal limits. No cardiac enlargement is seen. Pulmonary artery as visualized is within normal limits. Mediastinum/Nodes: Thoracic inlet is unremarkable. No hilar or mediastinal adenopathy is noted. The esophagus as visualized is within normal limits. Lungs/Pleura: The lungs are well aerated bilaterally. No focal infiltrate or sizable effusion is seen. No parenchymal nodules are noted. Musculoskeletal: No acute bony abnormality is noted. CT ABDOMEN PELVIS FINDINGS Hepatobiliary: No focal liver abnormality is seen. No gallstones, gallbladder wall thickening, or biliary dilatation. Pancreas: Unremarkable. No pancreatic ductal dilatation or surrounding inflammatory changes. Spleen: Normal in size without focal abnormality. Adrenals/Urinary Tract: Adrenal glands are within normal limits. Kidneys demonstrate a normal enhancement pattern bilaterally. Tiny nonobstructing left upper pole renal stone is seen unchanged from the prior study. No obstructive changes are seen. The bladder is within normal limits. Stomach/Bowel: There are changes consistent with a right lower quadrant colostomy. The colon proximal to this is well visualized without inflammatory change.  The appendix has been surgically removed. Stomach is within normal limits. Herniation of multiple small bowel loops adjacent to the colostomy are seen. Multiple inflamed loops of small bowel are identified matted within the lower pelvis distal to the herniated bowel loops at the level of the colostomy. Considerable wall thickening  is noted and there is inflammatory change and multiple extraluminal foci of air consistent with perforation. Considerable mesenteric inflammatory changes are noted as well. There is suggestion of an early extraluminal air fluid collection which measures approximately 4.2 x 3.1 cm. This is best seen on image number 106 of series 2. Postsurgical changes are noted with an ileal to ileal anastomosis which appears widely patent. Fecalized bowel content is noted in this region. This is distal to the matted inflamed loops of small bowel in the pelvis. The matted loops within the pelvis lie adjacent to considerable presacral soft tissue density similar to that seen on the prior exam as well as a an air-fluid collection in the presacral region. This air-fluid collection previously was noted to be bland fluid and the air within now suggests extension of the inflammatory change into this collection. Vascular/Lymphatic: No significant vascular findings are present. No enlarged abdominal or pelvic lymph nodes. Reproductive: Prostate is unremarkable. Other: Scattered free air throughout the abdomen consistent with the known perforation and inflammatory change within the pelvis. Musculoskeletal: No acute or significant osseous findings. IMPRESSION: Significantly inflamed loops of distal jejunum/proximal ileum matted within the lower pelvis adjacent to a previously seen presacral fluid collection. Air is now noted in that presacral fluid collection consistent with extension of local infection and there is considerable extraluminal air and fluid identified adjacent to these loops consistent with developing abscess and perforation. Surgical consultation is recommended. Multiple herniated loops of small bowel adjacent to the end colostomy in the right lower quadrant. These occur proximal to the areas of inflammation within the more distal small bowel. Tiny nonobstructing left renal stone. No acute abnormality in the chest.  Electronically Signed   By: Alcide Clever M.D.   On: 11/29/2023 21:00    Anti-infectives: Anti-infectives (From admission, onward)    Start     Dose/Rate Route Frequency Ordered Stop   11/29/23 2200  piperacillin-tazobactam (ZOSYN) IVPB 3.375 g        3.375 g 12.5 mL/hr over 240 Minutes Intravenous Every 8 hours 11/29/23 2147 12/04/23 2159   11/29/23 2130  metroNIDAZOLE (FLAGYL) IVPB 500 mg  Status:  Discontinued        500 mg 100 mL/hr over 60 Minutes Intravenous  Once 11/29/23 2119 11/29/23 2147   11/29/23 2130  ceFEPIme (MAXIPIME) 2 g in sodium chloride 0.9 % 100 mL IVPB  Status:  Discontinued        2 g 200 mL/hr over 30 Minutes Intravenous Every 24 hours 11/29/23 2126 11/29/23 2147        Assessment/Plan 53 y/o M w/ [id number] rectal adenocarcinoma diagnosed 2022 S/p chemoradiation 7-04/2021 S/p Robotic LAR, diverting ileostomy 07/17/2021 Dr. Maisie Fus, complicated by anterior midline anastomotic dehiscence - managed with ileostomy already in place, abx S/p chemotherapy with Xeloda S/p EUA, debridement anterior staple line/sigmoidoscopy 03/2022 (findings: R and L anterior fistula communicating at midline) S/p LAR with end colostomy, takedown of ileostomy 12/2022 due to persistent fistula. Has a rectal stump leak and chronic presacral collection    Now presenting with acute onset abdominal pain, nausea, vomiting  - afebrile, WBC 10 from 14 yesterday - CT abdomen pelvis on admission w/ inflammatory changes of  the distal jejum/proximal ileum in the RLQ/pelvis near chronic pre-sacral fluid collection. The presacral collection does contain air now. There are small foci of free air near the inflamed small bowel, concerning for perforation. - CT w/ PO contrast performed yesterday w/out extrav but with increased gas and fluid in the pelvis.  - clinically the patient is having less abdominal pain today, he is hemodynamically stable, abd exam is benign, and he is having flatus/stool vis colostomy.  Scan suggests that his chronic presacral collection is no longer draining effectively from his anus. Will discuss his care with Dr. Maisie Fus and make plans for surgical vs IR drainage.   FEN: NPO, IVF per primary ID: Zosyn VTE: SCD's. Hep gtt for hx PE Foley: none Dispo: [facility] service, floor level of care    PE 2022 - this prompted his malignany workup, on xarelto at baseline OSA DM   LOS: 2 days   I reviewed nursing notes, hospitalist notes, last 24 h vitals and pain scores, last 48 h intake and output, last 24 h labs and trends, and last 24 h imaging results.  This care required moderate level of medical decision making.   Hosie Spangle, PA-C [facility] Please see Amion for pager number during day hours 7:00am-4:30pm

## 2023-12-01 NOTE — Telephone Encounter (Signed)
 Steven Moulds, MD  Mansouraty, Netty Starring., MD; Romie Levee, MD; Lucky Rathke, CMA Ok to hold anticoagulation for the procedure Dr Meridee Score. Thanks

## 2023-12-01 NOTE — Progress Notes (Signed)
 PHARMACY - ANTICOAGULATION CONSULT NOTE  Pharmacy Consult for Heparin Indication: hx of pulmonary embolus  Allergies  Allergen Reactions   Contrast Media [Iodinated Contrast Media] Hives and Nausea Only    As a 53 year old experienced hives and nausea    Patient Measurements: Height: 5\' 10"  (177.8 cm) Weight: 88.5 kg (195 lb) IBW/kg (Calculated) : 73 Heparin Dosing Weight: TBW  Vital Signs: Temp: 98.9 F (37.2 C) (03/04 0407) Temp Source: Oral (03/04 0043) BP: 123/68 (03/04 0407) Pulse Rate: 105 (03/04 0407)  Labs: Recent Labs    11/29/23 1611 11/29/23 1611 11/29/23 2300 11/30/23 0441 11/30/23 0915 11/30/23 1653 12/01/23 0501  HGB 12.9*  --   --  11.7*  --   --  10.4*  HCT 42.4  --   --  40.4  --   --  36.6*  PLT 358  --   --  344  --   --  301  APTT  --    < > 37*  --  43* 41* 58*  LABPROT  --   --  18.2*  --   --   --   --   INR  --   --  1.5*  --   --   --   --   HEPARINUNFRC  --   --  >1.10*  --   --   --  0.13*  CREATININE 1.10  --   --  1.56*  --   --   --    < > = values in this interval not displayed.    Estimated Creatinine Clearance: 62.1 mL/min (A) (by C-G formula based on SCr of 1.56 mg/dL (H)).  Medications:  Xarelto 20mg  po daily - LD 3/1 @ 2345  Assessment: 53 yoM admitted with chronic pelvic abscess due to rectal stump disruption with perforation/intraperitoneal abscess currently being treated with antibiotics. He is on chronic Xarelto for hx recurrent VTE which is being held just in case he ends up requiring surgical intervention.  Baseline labs: Hg slightly low but appears to be pt's baseline, pltc WNL, Scr WNL. Coag labs pending.  Today, 12/01/23 aPTT and HL are both subtherapeutic Hgb 10.4, plts 301 No line or bleeding issues noted   Goal of Therapy:  Heparin level 0.3-0.7 units/ml aPTT 66-102 seconds Monitor platelets by anticoagulation protocol: Yes   Plan:  Repeat heparin 2500 unit bolus followed by increasing IV heparin to 1850  units/hr.   Check 6h aPTT/HL after rate change  Daily heparin level/aPTT and CBC while on heparin Monitor heparin using aPTT until correlates with heparin level.  Arley Phenix RPh 12/01/2023, 5:29 AM

## 2023-12-01 NOTE — Telephone Encounter (Signed)
-----   Message from Seward Iruku sent at 08/26/2023  7:39 AM EST ----- Regarding: RE: Repeat Colonoscopy Ok to hold anticoagulation for the procedure Dr Meridee Score. Thanks ----- Message ----- From: Lemar Lofty., MD Sent: 08/26/2023   6:08 AM EST To: Romie Levee, MD; Lucky Rathke, CMA; # Subject: Repeat Colonoscopy                             PI and AT, [facility] you are both well. This patient's colonoscopy recall came into the system. I see that he has had permanent colostomy at this point. I want to find out when you all feel comfortable about potential colonoscopy as well since he is on anticoagulation now, when he could come off of that if needed. Appreciate you guys thoughts and follow-up. I placed my medical assistant on here as well so that we can follow-up final recommendations for colonoscopy being performed. Thanks.  GM

## 2023-12-01 NOTE — Progress Notes (Signed)
  Progress Note   Patient: Steven [name] ZOX:[id number] DOB: [birth year] DOA: 11/29/2023     2 DOS: the patient was seen and examined on 12/01/2023 at 11:44AM      Brief hospital course: 53 y.o. M with hx rectal CA s/p LAR in 2022 w/ diverting ileostomy c/b post-op hematoma that eventually got infected and developed a chronic fluid collection in the pelvis, also hx PE on Xarelto who presented with increased pelvic pain.  CT showed inflamed small bowel, air in the presacral fluid collection.  General Surgery were consulted and he was admitted on fluids and antibiotics.     Assessment and Plan: * Chronic pelvic abscess due to rectal stump disruption Abdominal pain, nausea and vomiting CT yesterday showed no leaking of enteric contrast.  This would seem to a perforation or fistulization.  Regarding the nature of the abscess and the best management, I defer to general surgery - Bowel rest - Continue IV fluids - Continue Zosyn - Defer management to general surgery      AKI (acute kidney injury) (HCC) Baseline creatinine 0.9, up to 1.56 here on admission, down to 1.4 today with fluids - Continue IV fluids  History of pulmonary embolus (PE) - Hold home Xarelto - Continue IV heparin until surgical plan clear  Colostomy in place [facility]) - Consult WOC  Uncontrolled diabetes mellitus with hyperglycemia (HCC) Glucose controlled - Continue sliding scale corrections - Hold home Farxiga  OSA (obstructive sleep apnea) - CPAP at night  Anxiety - Continue home Xanax PRN  Asthma No evidence of flare - Continue PRN home albuterol  Bacteriuria Unclear the significance of this - Continue Zosyn, follow urine culture        Subjective: No change, still with abdominal pain.  No vomiting anymore.  Muscle still sore from vomiting several days ago.     Physical Exam: BP 118/74 (BP Location: Right Arm)   Pulse 99   Temp 98.4 F (36.9 C) (Oral)   Resp 18   Ht 5\' 10"  (1.778 m)    Wt 88.5 kg   SpO2 97%   BMI 27.98 kg/m   Thin adult male, sitting up in bed, interactive and appropriate RRR, no murmurs, no peripheral edema Respiratory rate normal, lungs clear without rales or wheezes Abdomen with diffuse tenderness, colostomy in place, Thank symmetric, speech fluent, moves all extremities with normal strength and coordination    Data Reviewed: Creatinine down to 1.4 CBC shows white blood cell count 10, hemoglobin 10.4 Urine culture growing gram-negative rods Discussed with general surgery team  Family Communication: Wife at the bedside    Disposition: Status is: Inpatient         Author: Alberteen Sam, MD 12/01/2023 3:00 PM  For on call review [email or web address].

## 2023-12-01 NOTE — Telephone Encounter (Signed)
 Appt made for follow up on 01/26/24 at 930 am with GM  Letter mailed to the pt with appt info

## 2023-12-01 NOTE — Progress Notes (Signed)
 PHARMACY - ANTICOAGULATION CONSULT NOTE  Pharmacy Consult for Heparin Indication: hx of pulmonary embolus  Allergies  Allergen Reactions   Contrast Media [Iodinated Contrast Media] Hives and Nausea Only    As a 53 year old experienced hives and nausea    Patient Measurements: Height: 5\' 10"  (177.8 cm) Weight: 88.5 kg (195 lb) IBW/kg (Calculated) : 73 Heparin Dosing Weight: TBW  Vital Signs: Temp: 99.9 F (37.7 C) (03/04 2003) Temp Source: Oral (03/04 2003) BP: 129/76 (03/04 2003) Pulse Rate: 99 (03/04 2003)  Labs: Recent Labs    11/29/23 1611 11/29/23 1611 11/29/23 2300 11/30/23 0441 11/30/23 0915 11/30/23 1653 12/01/23 0501 12/01/23 1240 12/01/23 2052  HGB 12.9*  --   --  11.7*  --   --  10.4*  --   --   HCT 42.4  --   --  40.4  --   --  36.6*  --   --   PLT 358  --   --  344  --   --  301  --   --   APTT  --   --  37*  --    < > 41* 58* 48*  --   LABPROT  --   --  18.2*  --   --   --   --   --   --   INR  --   --  1.5*  --   --   --   --   --   --   HEPARINUNFRC  --    < > >1.10*  --   --   --  0.13* 0.13* <0.10*  CREATININE 1.10  --   --  1.56*  --   --  1.40*  --   --    < > = values in this interval not displayed.    Estimated Creatinine Clearance: 69.1 mL/min (A) (by C-G formula based on SCr of 1.4 mg/dL (H)).  Medications:  Xarelto 20mg  po daily - LD 3/1 @ 2345  Assessment: 18 yoM admitted with chronic pelvic abscess due to rectal stump disruption with perforation/intraperitoneal abscess currently being treated with antibiotics. He is on chronic Xarelto for hx recurrent VTE which is being held just in case he ends up requiring surgical intervention.  Baseline labs: Hg slightly low but appears to be pt's baseline, pltc WNL, Scr WNL. Coag labs pending.  Today, 12/01/23 Heparin level now undetectable despite rate increase to 2100 units/hr (24 units/kg/hr Tbili normal (suppresses heparin level) Hgb 10.4, plts 301 No line or bleeding issues noted per RN     Goal of Therapy:  Heparin level 0.3-0.7 units/ml aPTT 66-102 seconds Monitor platelets by anticoagulation protocol: Yes   Plan:  Repeat heparin 2500 unit bolus followed by increasing IV heparin to 2500 units/hr (28 units/kg/hr)   Check 6h HL after rate change  Daily heparin level and CBC while on heparin Will monitor heparin using HL as aPTT and HL are correlating    Bernadene Person, PharmD, BCPS [phone] 12/01/2023, 9:47 PM

## 2023-12-01 NOTE — Inpatient Diabetes Management (Signed)
 Inpatient Diabetes Program Recommendations  AACE/ADA: New Consensus Statement on Inpatient Glycemic Control (2015)  Target Ranges:  Prepandial:   less than 140 mg/dL      Peak postprandial:   less than 180 mg/dL (1-2 hours)      Critically ill patients:  140 - 180 mg/dL   Lab Results  Component Value Date   GLUCAP 98 12/01/2023   HGBA1C 10.3 (H) 11/29/2023    Review of Glycemic Control  Diabetes history: DM2 Outpatient Diabetes medications: Xigduo XR 06-999 Q24H,  Current orders for Inpatient glycemic control: Novolog 0-15 Q4H  HgbA1C - 10.3% CBGs 126, 98 mg/dL today Steroids discontinued  Inpatient Diabetes Program Recommendations:    Spoke with pt on 3/3 afternoon regarding his diabetes control and HgbA1C of 10.3%. Pt states he was on Xigduo BID instead of Q24H and insurance decided not to cover the BID dosing. Pt states PCP may increase dose of the Xigduo (once/day) and see if this is acceptable to insurance co. Pt very knowledgeable about his diabetes and had his HgbA1C down in the 8% level. Monitors blood sugars daily and follows up with PCP on a regular basis. Pt states he may need to work on diet a little bit, and we discussed healthy eating with lower carbs, high fiber and variety of fresh fruits, vegetables and whole grains. Pt appreciative of visit.  Continue to follow  Thank you. Ailene Ards, RD, LDN, CDCES Inpatient Diabetes Coordinator [phone]

## 2023-12-01 NOTE — Telephone Encounter (Signed)
See alternate phone note  

## 2023-12-01 NOTE — Telephone Encounter (Signed)
 Dr Meridee Score the pt is currently admitted for bowel perf does he still need colon set up?

## 2023-12-01 NOTE — Progress Notes (Signed)
   12/01/23 2300  BiPAP/CPAP/SIPAP  BiPAP/CPAP/SIPAP Pt Type Adult  BiPAP/CPAP/SIPAP Resmed  Mask Type Full face mask  Dentures removed? Not applicable  Mask Size Large  FiO2 (%) 21 %  Patient Home Equipment No  Auto Titrate Yes (4-18)

## 2023-12-02 ENCOUNTER — Other Ambulatory Visit: Payer: Self-pay

## 2023-12-02 ENCOUNTER — Encounter (HOSPITAL_COMMUNITY)
Admission: EM | Disposition: A | Discharge status: Home Health | Payer: Self-pay | Source: Home / Self Care | Attending: Family Medicine

## 2023-12-02 ENCOUNTER — Encounter (HOSPITAL_COMMUNITY): Payer: Self-pay | Admitting: Internal Medicine

## 2023-12-02 ENCOUNTER — Inpatient Hospital Stay (HOSPITAL_COMMUNITY): Admitting: Certified Registered Nurse Anesthetist

## 2023-12-02 DIAGNOSIS — K651 Peritoneal abscess: Secondary | ICD-10-CM | POA: Diagnosis not present

## 2023-12-02 HISTORY — PX: RECTOPEXY: SHX5700

## 2023-12-02 LAB — GLUCOSE, CAPILLARY
Glucose-Capillary: 108 mg/dL — ABNORMAL HIGH (ref 70–99)
Glucose-Capillary: 117 mg/dL — ABNORMAL HIGH (ref 70–99)
Glucose-Capillary: 118 mg/dL — ABNORMAL HIGH (ref 70–99)
Glucose-Capillary: 128 mg/dL — ABNORMAL HIGH (ref 70–99)
Glucose-Capillary: 137 mg/dL — ABNORMAL HIGH (ref 70–99)
Glucose-Capillary: 145 mg/dL — ABNORMAL HIGH (ref 70–99)
Glucose-Capillary: 152 mg/dL — ABNORMAL HIGH (ref 70–99)
Glucose-Capillary: 248 mg/dL — ABNORMAL HIGH (ref 70–99)
Glucose-Capillary: 265 mg/dL — ABNORMAL HIGH (ref 70–99)

## 2023-12-02 LAB — CBC
HCT: 34.3 % — ABNORMAL LOW (ref 39.0–52.0)
Hemoglobin: 9.7 g/dL — ABNORMAL LOW (ref 13.0–17.0)
MCH: 21.5 pg — ABNORMAL LOW (ref 26.0–34.0)
MCHC: 28.3 g/dL — ABNORMAL LOW (ref 30.0–36.0)
MCV: 75.9 fL — ABNORMAL LOW (ref 80.0–100.0)
Platelets: 255 10*3/uL (ref 150–400)
RBC: 4.52 MIL/uL (ref 4.22–5.81)
RDW: 19 % — ABNORMAL HIGH (ref 11.5–15.5)
WBC: 7.7 10*3/uL (ref 4.0–10.5)
nRBC: 0 % (ref 0.0–0.2)

## 2023-12-02 LAB — URINE CULTURE: Culture: >=100000 — AB

## 2023-12-02 LAB — BASIC METABOLIC PANEL
Anion gap: 9 (ref 5–15)
BUN: 22 mg/dL — ABNORMAL HIGH (ref 6–20)
CO2: 16 mmol/L — ABNORMAL LOW (ref 22–32)
Calcium: 7.9 mg/dL — ABNORMAL LOW (ref 8.9–10.3)
Chloride: 108 mmol/L (ref 98–111)
Creatinine, Ser: 1.02 mg/dL (ref 0.61–1.24)
GFR, Estimated: >60 mL/min (ref >60)
Glucose, Bld: 137 mg/dL — ABNORMAL HIGH (ref 70–99)
Potassium: 3.6 mmol/L (ref 3.5–5.1)
Sodium: 133 mmol/L — ABNORMAL LOW (ref 135–145)

## 2023-12-02 LAB — HEPARIN LEVEL (UNFRACTIONATED): Heparin Unfractionated: 0.18 [IU]/mL — ABNORMAL LOW (ref 0.30–0.70)

## 2023-12-02 SURGERY — RECTOPEXY
Anesthesia: General

## 2023-12-02 MED ORDER — SUGAMMADEX SODIUM 200 MG/2ML IV SOLN
INTRAVENOUS | Status: AC
  Filled 2023-12-02: qty 2

## 2023-12-02 MED ORDER — HYDROMORPHONE HCL 2 MG/ML IJ SOLN
INTRAMUSCULAR | Status: AC
Start: 2023-12-02 — End: ?
  Filled 2023-12-02: qty 1

## 2023-12-02 MED ORDER — BUPIVACAINE-EPINEPHRINE 0.25% -1:200000 IJ SOLN
INTRAMUSCULAR | Status: DC | PRN
  Administered 2023-12-02: 30 mL

## 2023-12-02 MED ORDER — DEXAMETHASONE SODIUM PHOSPHATE 10 MG/ML IJ SOLN
INTRAMUSCULAR | Status: DC | PRN
  Administered 2023-12-02: 8 mg via INTRAVENOUS

## 2023-12-02 MED ORDER — ROCURONIUM BROMIDE 10 MG/ML (PF) SYRINGE
PREFILLED_SYRINGE | INTRAVENOUS | Status: DC | PRN
  Administered 2023-12-02: 60 mg via INTRAVENOUS
  Administered 2023-12-02: 20 mg via INTRAVENOUS

## 2023-12-02 MED ORDER — DEXAMETHASONE SODIUM PHOSPHATE 10 MG/ML IJ SOLN
INTRAMUSCULAR | Status: AC
  Filled 2023-12-02: qty 1

## 2023-12-02 MED ORDER — HYDROMORPHONE HCL 1 MG/ML IJ SOLN: 0.2500 mg | INTRAMUSCULAR | Status: DC | PRN

## 2023-12-02 MED ORDER — BUPIVACAINE-EPINEPHRINE (PF) 0.25% -1:200000 IJ SOLN
INTRAMUSCULAR | Status: AC
  Filled 2023-12-02: qty 30

## 2023-12-02 MED ORDER — PROPOFOL 10 MG/ML IV BOLUS
INTRAVENOUS | Status: DC | PRN
  Administered 2023-12-02: 180 mg via INTRAVENOUS

## 2023-12-02 MED ORDER — DEXMEDETOMIDINE HCL IN NACL 80 MCG/20ML IV SOLN
INTRAVENOUS | Status: AC
  Filled 2023-12-02: qty 20

## 2023-12-02 MED ORDER — ONDANSETRON HCL 4 MG/2ML IJ SOLN
INTRAMUSCULAR | Status: AC
  Filled 2023-12-02: qty 2

## 2023-12-02 MED ORDER — OXYCODONE HCL 5 MG/5ML PO SOLN: 5.0000 mg | Freq: Once | ORAL | Status: DC | PRN

## 2023-12-02 MED ORDER — 0.9 % SODIUM CHLORIDE (POUR BTL) OPTIME
TOPICAL | Status: DC | PRN
  Administered 2023-12-02: 1000 mL

## 2023-12-02 MED ORDER — SODIUM CHLORIDE (PF) 0.9 % IJ SOLN
INTRAMUSCULAR | Status: AC
  Filled 2023-12-02: qty 10

## 2023-12-02 MED ORDER — PROPOFOL 10 MG/ML IV BOLUS
INTRAVENOUS | Status: AC
  Filled 2023-12-02: qty 20

## 2023-12-02 MED ORDER — CHLORHEXIDINE GLUCONATE 0.12 % MT SOLN
15.0000 mL | Freq: Once | OROMUCOSAL | Status: AC
  Administered 2023-12-02: 15 mL via OROMUCOSAL

## 2023-12-02 MED ORDER — ALBUMIN HUMAN 5 % IV SOLN: INTRAVENOUS | Status: DC | PRN

## 2023-12-02 MED ORDER — ALBUMIN HUMAN 5 % IV SOLN
INTRAVENOUS | Status: AC
  Filled 2023-12-02: qty 250

## 2023-12-02 MED ORDER — ONDANSETRON HCL 4 MG/2ML IJ SOLN: 4.0000 mg | Freq: Once | INTRAMUSCULAR | Status: DC | PRN

## 2023-12-02 MED ORDER — BUPIVACAINE LIPOSOME 1.3 % IJ SUSP
INTRAMUSCULAR | Status: DC | PRN
  Administered 2023-12-02: 20 mL

## 2023-12-02 MED ORDER — MIDAZOLAM HCL 5 MG/5ML IJ SOLN
INTRAMUSCULAR | Status: DC | PRN
  Administered 2023-12-02: 2 mg via INTRAVENOUS

## 2023-12-02 MED ORDER — HYDROMORPHONE HCL 1 MG/ML IJ SOLN
INTRAMUSCULAR | Status: DC | PRN
  Administered 2023-12-02: .4 mg via INTRAVENOUS

## 2023-12-02 MED ORDER — HEMOSTATIC AGENTS (NO CHARGE) OPTIME
TOPICAL | Status: DC | PRN
  Administered 2023-12-02: 1

## 2023-12-02 MED ORDER — SUGAMMADEX SODIUM 200 MG/2ML IV SOLN
INTRAVENOUS | Status: DC | PRN
  Administered 2023-12-02: 100 mg via INTRAVENOUS
  Administered 2023-12-02: 200 mg via INTRAVENOUS

## 2023-12-02 MED ORDER — LACTATED RINGERS IV SOLN: INTRAVENOUS | Status: DC

## 2023-12-02 MED ORDER — MIDAZOLAM HCL 2 MG/2ML IJ SOLN
INTRAMUSCULAR | Status: AC
  Filled 2023-12-02: qty 2

## 2023-12-02 MED ORDER — OXYCODONE HCL 5 MG PO TABS: 5.0000 mg | ORAL_TABLET | Freq: Once | ORAL | Status: DC | PRN

## 2023-12-02 MED ORDER — DEXMEDETOMIDINE HCL IN NACL 80 MCG/20ML IV SOLN
INTRAVENOUS | Status: DC | PRN
  Administered 2023-12-02: 4 ug via INTRAVENOUS
  Administered 2023-12-02: 8 ug via INTRAVENOUS
  Administered 2023-12-02: 4 ug via INTRAVENOUS

## 2023-12-02 MED ORDER — INSULIN ASPART 100 UNIT/ML IJ SOLN: 0.0000 [IU] | INTRAMUSCULAR | Status: DC | PRN

## 2023-12-02 MED ORDER — BUPIVACAINE LIPOSOME 1.3 % IJ SUSP
INTRAMUSCULAR | Status: AC
Start: 2023-12-02 — End: ?
  Filled 2023-12-02: qty 20

## 2023-12-02 MED ORDER — FENTANYL CITRATE (PF) 100 MCG/2ML IJ SOLN
INTRAMUSCULAR | Status: AC
Start: 2023-12-02 — End: ?
  Filled 2023-12-02: qty 2

## 2023-12-02 MED ORDER — FENTANYL CITRATE (PF) 100 MCG/2ML IJ SOLN
INTRAMUSCULAR | Status: DC | PRN
  Administered 2023-12-02 (×2): 50 ug via INTRAVENOUS

## 2023-12-02 MED ORDER — LIDOCAINE HCL (PF) 2 % IJ SOLN
INTRAMUSCULAR | Status: DC | PRN
  Administered 2023-12-02: 100 mg via INTRADERMAL

## 2023-12-02 SURGICAL SUPPLY — 42 items
BAG COUNTER SPONGE SURGICOUNT (BAG) IMPLANT
BAG URO CATCHER STRL LF (MISCELLANEOUS) IMPLANT
BLADE EXTENDED COATED 6.5IN (ELECTRODE) IMPLANT
BNDG GAUZE DERMACEA FLUFF 4 (GAUZE/BANDAGES/DRESSINGS) IMPLANT
BRIEF MESH DISP LRG (UNDERPADS AND DIAPERS) IMPLANT
CABLE HIGH FREQUENCY MONO STRZ (ELECTRODE) IMPLANT
DRAPE LAPAROTOMY T 102X78X121 (DRAPES) ×1 IMPLANT
DRAPE SURG IRRIG POUCH 19X23 (DRAPES) ×1 IMPLANT
ELECT REM PT RETURN 15FT ADLT (MISCELLANEOUS) ×1 IMPLANT
GAUZE 4X4 16PLY ~~LOC~~+RFID DBL (SPONGE) IMPLANT
GAUZE PAD ABD 8X10 STRL (GAUZE/BANDAGES/DRESSINGS) IMPLANT
GAUZE SPONGE 4X4 12PLY STRL (GAUZE/BANDAGES/DRESSINGS) IMPLANT
GLOVE BIO SURGEON STRL SZ 6.5 (GLOVE) ×1 IMPLANT
GLOVE INDICATOR 6.5 STRL GRN (GLOVE) ×2 IMPLANT
GOWN STRL REUS W/ TWL XL LVL3 (GOWN DISPOSABLE) ×2 IMPLANT
HEMOSTAT ARISTA ABSORB 3G PWDR (HEMOSTASIS) IMPLANT
IRRIG SUCT STRYKERFLOW 2 WTIP (MISCELLANEOUS) ×1 IMPLANT
IRRIGATION SUCT STRKRFLW 2 WTP (MISCELLANEOUS) ×1 IMPLANT
KIT BASIN OR (CUSTOM PROCEDURE TRAY) ×1 IMPLANT
KIT TURNOVER KIT A (KITS) IMPLANT
LEGGING LITHOTOMY PAIR STRL (DRAPES) ×1 IMPLANT
MANIFOLD NEPTUNE II (INSTRUMENTS) ×1 IMPLANT
NDL HYPO 22X1.5 SAFETY MO (MISCELLANEOUS) IMPLANT
NEEDLE HYPO 22X1.5 SAFETY MO (MISCELLANEOUS) ×1 IMPLANT
NS IRRIG 1000ML POUR BTL (IV SOLUTION) ×1 IMPLANT
PENCIL SMOKE EVACUATOR (MISCELLANEOUS) IMPLANT
PLATFORM TRANSANAL ACCESS 4X5 (MISCELLANEOUS) IMPLANT
PLATFORM TRANSANAL ACCESS 4X5. (MISCELLANEOUS) IMPLANT
RETRACTOR LONE STAR DISPOSABLE (INSTRUMENTS) IMPLANT
RETRACTOR STAY HOOK 5MM (MISCELLANEOUS) IMPLANT
SET TUBE SMOKE EVAC HIGH FLOW (TUBING) ×1 IMPLANT
SHEARS HARMONIC 36 ACE (MISCELLANEOUS) ×1 IMPLANT
SURGILUBE 2OZ TUBE FLIPTOP (MISCELLANEOUS) ×1 IMPLANT
SUT PDS AB 2-0 CT2 27 (SUTURE) IMPLANT
SUT STRATA PDS 2-0 15 CT-2.5 (SUTURE) IMPLANT
SUT VIC AB 2-0 SH 27X BRD (SUTURE) IMPLANT
SUTURE STRAT PDS 2-0 15 CT-2.5 (SUTURE) IMPLANT
SYR 20ML LL LF (SYRINGE) IMPLANT
TOWEL OR 17X26 10 PK STRL BLUE (TOWEL DISPOSABLE) ×1 IMPLANT
TRAY FOLEY MTR SLVR 16FR STAT (SET/KITS/TRAYS/PACK) ×1 IMPLANT
TRAY LAPAROSCOPIC (CUSTOM PROCEDURE TRAY) ×1 IMPLANT
TUBING CONNECTING 10 (TUBING) ×1 IMPLANT

## 2023-12-02 NOTE — Anesthesia Postprocedure Evaluation (Signed)
 Anesthesia Post Note  Patient: Steven [name]  Procedure(s) Performed: Perineal Proctatectomy     Patient location during evaluation: PACU Anesthesia Type: General Level of consciousness: awake and alert and oriented Pain management: pain level controlled Vital Signs Assessment: post-procedure vital signs reviewed and stable Respiratory status: spontaneous breathing, nonlabored ventilation and respiratory function stable Cardiovascular status: blood pressure returned to baseline and stable Postop Assessment: no apparent nausea or vomiting Anesthetic complications: no   No notable events documented.  Last Vitals:  Vitals:   12/02/23 1715 12/02/23 1747  BP: 117/72 110/65  Pulse: 88 85  Resp: 14   Temp: 36.5 C 37.2 C  SpO2: 92% 95%    Last Pain:  Vitals:   12/02/23 1747  TempSrc: Oral  PainSc:                  Adonias Demore A.

## 2023-12-02 NOTE — Progress Notes (Signed)
 PHARMACY - ANTICOAGULATION CONSULT NOTE  Pharmacy Consult for Heparin Indication: hx of pulmonary embolus  Allergies  Allergen Reactions   Contrast Media [Iodinated Contrast Media] Hives and Nausea Only    As a 53 year old experienced hives and nausea    Patient Measurements: Height: 5\' 10"  (177.8 cm) Weight: 88.5 kg (195 lb) IBW/kg (Calculated) : 73 Heparin Dosing Weight: TBW  Vital Signs: Temp: 98.9 F (37.2 C) (03/05 0339) Temp Source: Oral (03/05 0339) BP: 118/73 (03/05 0339) Pulse Rate: 96 (03/05 0339)  Labs: Recent Labs    11/29/23 1611 11/29/23 1611 11/29/23 2300 11/30/23 0441 11/30/23 0915 11/30/23 1653 12/01/23 0501 12/01/23 1240 12/01/23 2052 12/02/23 0438  HGB 12.9*  --   --  11.7*  --   --  10.4*  --   --  9.7*  HCT 42.4  --   --  40.4  --   --  36.6*  --   --  34.3*  PLT 358  --   --  344  --   --  301  --   --  255  APTT  --   --  37*  --    < > 41* 58* 48*  --   --   LABPROT  --   --  18.2*  --   --   --   --   --   --   --   INR  --   --  1.5*  --   --   --   --   --   --   --   HEPARINUNFRC  --    < > >1.10*  --   --   --  0.13* 0.13* <0.10* 0.18*  CREATININE 1.10  --   --  1.56*  --   --  1.40*  --   --   --    < > = values in this interval not displayed.    Estimated Creatinine Clearance: 69.1 mL/min (A) (by C-G formula based on SCr of 1.4 mg/dL (H)).  Medications:  Xarelto 20mg  po daily - LD 3/1 @ 2345  Assessment: 34 yoM admitted with chronic pelvic abscess due to rectal stump disruption with perforation/intraperitoneal abscess currently being treated with antibiotics. He is on chronic Xarelto for hx recurrent VTE which is being held just in case he ends up requiring surgical intervention.  Baseline labs: Hg slightly low but appears to be pt's baseline, pltc WNL, Scr WNL. Coag labs pending.  Today, 12/02/23 Heparin level 0.18 subtherapeutic on 2500 units/hr Tbili normal (suppresses heparin level) Hgb 9.7, plts 255 No line or bleeding  issues noted    Goal of Therapy:  Heparin level 0.3-0.7 units/ml aPTT 66-102 seconds Monitor platelets by anticoagulation protocol: Yes   Plan:  Increase heparin drip to 2700 units/hr (30 units/kg/hr) Check 6h HL after rate change  Daily heparin level and CBC while on heparin  Arley Phenix RPh 12/02/2023, 5:35 AM

## 2023-12-02 NOTE — Transfer of Care (Signed)
 Immediate Anesthesia Transfer of Care Note  Patient: Steven [name]  Procedure(s) Performed: Perineal Proctatectomy  Patient Location: PACU  Anesthesia Type:General  Level of Consciousness: awake, alert , and oriented  Airway & Oxygen Therapy: Patient Spontanous Breathing and Patient connected to face mask oxygen  Post-op Assessment: Report given to RN and Post -op Vital signs reviewed and stable  Post vital signs: Reviewed and stable  Last Vitals:  Vitals Value Taken Time  BP 95/68 12/02/23 1647  Temp    Pulse 96 12/02/23 1653  Resp 18 12/02/23 1653  SpO2 94 % 12/02/23 1653  Vitals shown include unfiled device data.  Last Pain:  Vitals:   12/02/23 1647  TempSrc:   PainSc: Asleep      Patients Stated Pain Goal: 2 (12/01/23 1558)  Complications: No notable events documented.

## 2023-12-02 NOTE — Op Note (Signed)
 12/02/2023  4:27 PM  PATIENT:  Steven [name]  53 y.o. male  Patient Care Team: Noberto Retort, MD as PCP - General (Family Medicine) Rachel Moulds, MD as Consulting Physician (Hematology and Oncology) Romie Levee, MD as Consulting Physician (General Surgery) Axel Filler, Larna Daughters, NP as Nurse Practitioner (Hematology and Oncology) Dorothy Puffer, MD as Consulting Physician (Radiation Oncology) Mansouraty, Netty Starring., MD as Consulting Physician (Gastroenterology)  PRE-OPERATIVE DIAGNOSIS:  Pelvic abscess  POST-OPERATIVE DIAGNOSIS:  Pelvic abscess  PROCEDURE:  Perineal Proctatectomy   Surgeon(s): Romie Levee, MD Andria Meuse, MD  ASSISTANT: Dr Cliffton Asters   ANESTHESIA:   local and general  SPECIMEN:  Source of Specimen:  anal canal and rectum  DISPOSITION OF SPECIMEN:  PATHOLOGY  [name]:  YES  PLAN OF CARE:  Patient already admitted  PATIENT DISPOSITION:  PACU - hemodynamically stable.  INDICATION: 53 year old male with a history of distal rectal cancer status post Chemo RT and low anterior resection.  He developed an anastomotic leak and distal rectal stricture and did not tolerate his reversal.  He was converted to a permanent colostomy and his anorectal anastomosis was divided and the anus was closed.  Unfortunately developed a pelvic abscess and the anal canal opened up and he has had a chronic pelvic abscess since then.  This is becoming difficult to control and I recommended proceeding with a perineal proctectomy to drain his abscess and heal his posterior pelvic cavity.   OR FINDINGS: 5x5cm pelvic abscess cavity  DESCRIPTION: the patient was identified in the preoperative holding area and taken to the OR where they were laid on the operating room table.  Gerneral anesthesia was induced without difficulty. The patient was then positioned in prone jackknife position with buttocks gently taped apart.  The patient was then prepped and draped in usual sterile  fashion.  SCDs were noted to be in place prior to the initiation of anesthesia. A surgical timeout was performed indicating the correct patient, procedure, positioning and need for preoperative antibiotics.  A rectal block was performed using Marcaine with epinephrine mixed with Experel.    I began with a digital rectal exam.  Patient had approximately 6 cm of anorectal mucosa.  This was opened proximally to the pelvic abscess cavity.  Patient was used to drain the abscess cavity.  Approximately 100 cc of purulent material was evacuated.  I began by making an incision in the skin using electrocautery around the anal canal circumferentially.  I began my dissection posteriorly dividing through the posterior attachments using electrocautery.  Once down to the level of the coccyx I was able to enter into the pelvic abscess space.  I then worked my way around laterally using a [place] device for retraction.  The anterior portion was done last being careful to avoid the anterior pelvic structures.  Once this was complete the specimen was sent to pathology and the cavity was packed.  Hemostasis was achieved using direct pressure, electrocautery, suture ligation and Arista powder placement.  Once this was complete, the anterior portion of the wound was closed using interrupted PDS sutures for the deep portions of the cavity and interrupted 2-0 Vicryl sutures for the shallow subcutaneous tissue and dermal layer.  I left a 3 x 3 cm opening in the skin for packing.  The cavity was noted to be approximately 8 cm deep.  This was packed with moist Kerlix sponge.  A dressing was applied.  The patient was then awakened from anesthesia and sent to the  postanesthesia care unit in stable condition.  All [name] were correct per operating room staff.  Vanita Panda, MD  Colorectal and General Surgery [facility]

## 2023-12-02 NOTE — Anesthesia Preprocedure Evaluation (Signed)
 Anesthesia Evaluation  Patient identified by MRN, date of birth, ID band Patient awake    Reviewed: Allergy & Precautions, NPO status , Patient's Chart, lab work & pertinent test results, reviewed documented beta blocker date and time   Airway Mallampati: III       Dental no notable dental hx. (+) Teeth Intact, Dental Advisory Given   Pulmonary asthma , sleep apnea and Continuous Positive Airway Pressure Ventilation , PE   Pulmonary exam normal breath sounds clear to auscultation       Cardiovascular hypertension, Pt. on medications + Peripheral Vascular Disease and + DVT  Normal cardiovascular exam Rhythm:Regular Rate:Normal     Neuro/Psych  PSYCHIATRIC DISORDERS Anxiety Depression    Peripheral neuropathy- Chemotherapy induced  Neuromuscular disease    GI/Hepatic Neg liver ROS,GERD  Medicated,,Hx/o rectal adenoCa S/P robotic Low anterior resection complicated by pelvic abscess   Endo/Other  diabetes, Well Controlled, Type 2, Oral Hypoglycemic Agents    Renal/GU Renal diseaseHx/o renal calculi  negative genitourinary   Musculoskeletal   Abdominal   Peds  Hematology  (+) Blood dyscrasia, anemia Xarelto therapy - last dose   Anesthesia Other Findings   Reproductive/Obstetrics                              Anesthesia Physical Anesthesia Plan  ASA: 3  Anesthesia Plan: General   Post-op Pain Management: Minimal or no pain anticipated and Dilaudid IV   Induction: Intravenous  PONV Risk Score and Plan: 4 or greater and Treatment may vary due to age or medical condition, Midazolam, Ondansetron and Dexamethasone  Airway Management Planned: Oral ETT  Additional Equipment: None  Intra-op Plan:   Post-operative Plan: Extubation in OR  Informed Consent: I have reviewed the patients History and Physical, chart, labs and discussed the procedure including the risks, benefits and alternatives  for the proposed anesthesia with the patient or authorized representative who has indicated his/her understanding and acceptance.     Dental advisory given  Plan Discussed with: Anesthesiologist and CRNA  Anesthesia Plan Comments:          Anesthesia Quick Evaluation

## 2023-12-02 NOTE — Progress Notes (Signed)
 Pelvic abscess in male [facility])  Subjective: Pt feeling well this am.    Objective: Vital signs in last 24 hours: Temp:  [98.4 F (36.9 C)-99.9 F (37.7 C)] 98.9 F (37.2 C) (03/05 0339) Pulse Rate:  [96-99] 96 (03/05 0339) Resp:  [18-20] 20 (03/05 0339) BP: (118-129)/(73-76) 118/73 (03/05 0339) SpO2:  [95 %-98 %] 95 % (03/05 0339) FiO2 (%):  [21 %] 21 % (03/04 2300) Last BM Date : 12/01/23  Intake/Output from previous day: 03/04 0701 - 03/05 0700 In: 2945.7 [I.V.:2801.4; IV Piggyback:144.3] Out: -  Intake/Output this shift: Total I/O In: 279.8 [I.V.:228.6; IV Piggyback:51.2] Out: -   General appearance: alert and cooperative  Lab Results:  Results for orders placed or performed during the hospital encounter of 11/29/23 (from the past 24 hours)  Glucose, capillary     Status: Abnormal   Collection Time: 12/01/23 11:51 AM  Result Value Ref Range   Glucose-Capillary 102 (H) 70 - 99 mg/dL  APTT     Status: Abnormal   Collection Time: 12/01/23 12:40 PM  Result Value Ref Range   aPTT 48 (H) 24 - 36 seconds  Heparin level (unfractionated)     Status: Abnormal   Collection Time: 12/01/23 12:40 PM  Result Value Ref Range   Heparin Unfractionated 0.13 (L) 0.30 - 0.70 IU/mL  Glucose, capillary     Status: Abnormal   Collection Time: 12/01/23  4:25 PM  Result Value Ref Range   Glucose-Capillary 128 (H) 70 - 99 mg/dL  Glucose, capillary     Status: Abnormal   Collection Time: 12/01/23  8:00 PM  Result Value Ref Range   Glucose-Capillary 143 (H) 70 - 99 mg/dL   Comment 1 Notify RN   Heparin level (unfractionated)     Status: Abnormal   Collection Time: 12/01/23  8:52 PM  Result Value Ref Range   Heparin Unfractionated <0.10 (L) 0.30 - 0.70 IU/mL  Glucose, capillary     Status: Abnormal   Collection Time: 12/02/23  2:44 AM  Result Value Ref Range   Glucose-Capillary 145 (H) 70 - 99 mg/dL  CBC     Status: Abnormal   Collection Time: 12/02/23  4:38 AM  Result Value Ref  Range   WBC 7.7 4.0 - 10.5 K/uL   RBC 4.52 4.22 - 5.81 MIL/uL   Hemoglobin 9.7 (L) 13.0 - 17.0 g/dL   HCT 16.1 (L) 09.6 - 04.5 %   MCV 75.9 (L) 80.0 - 100.0 fL   MCH 21.5 (L) 26.0 - 34.0 pg   MCHC 28.3 (L) 30.0 - 36.0 g/dL   RDW 40.9 (H) 81.1 - 91.4 %   Platelets 255 150 - 400 K/uL   nRBC 0.0 0.0 - 0.2 %  Basic metabolic panel     Status: Abnormal   Collection Time: 12/02/23  4:38 AM  Result Value Ref Range   Sodium 133 (L) 135 - 145 mmol/L   Potassium 3.6 3.5 - 5.1 mmol/L   Chloride 108 98 - 111 mmol/L   CO2 16 (L) 22 - 32 mmol/L   Glucose, Bld 137 (H) 70 - 99 mg/dL   BUN 22 (H) 6 - 20 mg/dL   Creatinine, Ser 7.82 0.61 - 1.24 mg/dL   Calcium 7.9 (L) 8.9 - 10.3 mg/dL   GFR, Estimated >95 >62 mL/min   Anion gap 9 5 - 15  Heparin level (unfractionated)     Status: Abnormal   Collection Time: 12/02/23  4:38 AM  Result Value Ref Range  Heparin Unfractionated 0.18 (L) 0.30 - 0.70 IU/mL  Glucose, capillary     Status: Abnormal   Collection Time: 12/02/23  5:05 AM  Result Value Ref Range   Glucose-Capillary 128 (H) 70 - 99 mg/dL   Comment 1 Notify RN   Glucose, capillary     Status: Abnormal   Collection Time: 12/02/23  7:46 AM  Result Value Ref Range   Glucose-Capillary 117 (H) 70 - 99 mg/dL   Comment 1 Notify RN      Studies/Results Radiology     MEDS, Scheduled  insulin aspart  0-15 Units Subcutaneous Q4H   mouth rinse  15 mL Mouth Rinse 4 times per day     Assessment: Pelvic abscess in male [facility])   Plan: OR today for perineal proctectomy.  Will leave wound open and packed.  All questions answered.      LOS: 3 days    Vanita Panda, MD [facility], [place]  Patient's medical decision making was straightforward    12/02/2023 10:03 AM

## 2023-12-02 NOTE — Progress Notes (Signed)
  Progress Note   Patient: Steven [name] [id number]:[id number] DOB: Oct 11, 1970 DOA: 11/29/2023     3 DOS: the patient was seen and examined on 12/02/2023        Brief hospital course: 53 y.o. M with hx rectal CA s/p LAR in 2022 w/ diverting ileostomy c/b post-op hematoma that eventually got infected and developed a chronic fluid collection in the pelvis, also hx PE on Xarelto who presented with increased pelvic pain, pelvic fluid collection.       Assessment and Plan: * Chronic pelvic abscess due to rectal stump disruption - Continue antibiotics - Hold heparin post op until cleared by surgery - Diet per surgery   AKI (acute kidney injury) (HCC) Cr normalized  History of pulmonary embolus (PE) - Hold home Xarelto - Hold heparin until cleared by surgery  Uncontrolled diabetes mellitus with hyperglycemia (HCC) Glucose controlled - Continue sliding scale corrections - Hold home Farxiga  OSA (obstructive sleep apnea) - CPAP noct  Anxiety - Continue home Xanax PRN  Asthma No evidence of flare - Continue PRN home albuterol  Non anion gap metabolic acidosis - Hold fluids       Subjective: Doing well post op.  Hungry. No fever.     Physical Exam: BP 110/65 (BP Location: Right Arm)   Pulse 85   Temp 99 F (37.2 C) (Oral)   Resp 14   Ht 5\' 10"  (1.778 m)   Wt 88.5 kg   SpO2 95%   BMI 27.98 kg/m   Adult male, lying in bed, no acute distress RRR no murmurs, no LE edema RR normal, lungs clear Abdomen tender but better than preop, colostomy in place Attention normal, face symmetric, speech fluent, moves all extremities with equal coordination  Data Reviewed: BMP shows mild hyponatremia, mild NAGMA CBC shows mild anemia  Family Communication: Wife    Disposition: Status is: Inpatient         Author: Alberteen Sam, MD 12/02/2023 7:04 PM  For on call review [email or web address].

## 2023-12-02 NOTE — Plan of Care (Signed)

## 2023-12-02 NOTE — Anesthesia Procedure Notes (Signed)
 Procedure Name: Intubation Date/Time: 12/02/2023 12:48 PM  Performed by: Floydene Flock, CRNAPre-anesthesia Checklist: Patient identified, Emergency Drugs available, Suction available and Patient being monitored Patient Re-evaluated:Patient Re-evaluated prior to induction Oxygen Delivery Method: Circle system utilized Preoxygenation: Pre-oxygenation with 100% oxygen Induction Type: IV induction Ventilation: Mask ventilation without difficulty Laryngoscope Size: Mac and 3 Grade View: Grade I Tube type: Oral Tube size: 7.5 mm Number of attempts: 1 Airway Equipment and Method: Stylet Placement Confirmation: ETT inserted through vocal cords under direct vision, positive ETCO2 and breath sounds checked- equal and bilateral Secured at: 22 cm Tube secured with: Tape Dental Injury: Teeth and Oropharynx as per pre-operative assessment

## 2023-12-02 NOTE — Progress Notes (Signed)
 CHG Wipes completed by the patient at this time.

## 2023-12-03 ENCOUNTER — Encounter (HOSPITAL_COMMUNITY): Payer: Self-pay | Admitting: General Surgery

## 2023-12-03 ENCOUNTER — Inpatient Hospital Stay (HOSPITAL_COMMUNITY)

## 2023-12-03 DIAGNOSIS — K651 Peritoneal abscess: Secondary | ICD-10-CM | POA: Diagnosis not present

## 2023-12-03 LAB — BASIC METABOLIC PANEL
Anion gap: 9 (ref 5–15)
BUN: 28 mg/dL — ABNORMAL HIGH (ref 6–20)
CO2: 21 mmol/L — ABNORMAL LOW (ref 22–32)
Calcium: 8.4 mg/dL — ABNORMAL LOW (ref 8.9–10.3)
Chloride: 106 mmol/L (ref 98–111)
Creatinine, Ser: 1.01 mg/dL (ref 0.61–1.24)
GFR, Estimated: >60 mL/min (ref >60)
Glucose, Bld: 215 mg/dL — ABNORMAL HIGH (ref 70–99)
Potassium: 4.5 mmol/L (ref 3.5–5.1)
Sodium: 136 mmol/L (ref 135–145)

## 2023-12-03 LAB — GLUCOSE, CAPILLARY
Glucose-Capillary: 171 mg/dL — ABNORMAL HIGH (ref 70–99)
Glucose-Capillary: 213 mg/dL — ABNORMAL HIGH (ref 70–99)
Glucose-Capillary: 178 mg/dL — ABNORMAL HIGH (ref 70–99)
Glucose-Capillary: 184 mg/dL — ABNORMAL HIGH (ref 70–99)
Glucose-Capillary: 243 mg/dL — ABNORMAL HIGH (ref 70–99)

## 2023-12-03 LAB — CBC
HCT: 31.3 % — ABNORMAL LOW (ref 39.0–52.0)
Hemoglobin: 9.3 g/dL — ABNORMAL LOW (ref 13.0–17.0)
MCH: 21.4 pg — ABNORMAL LOW (ref 26.0–34.0)
MCHC: 29.7 g/dL — ABNORMAL LOW (ref 30.0–36.0)
MCV: 72.1 fL — ABNORMAL LOW (ref 80.0–100.0)
Platelets: 251 10*3/uL (ref 150–400)
RBC: 4.34 MIL/uL (ref 4.22–5.81)
RDW: 18.5 % — ABNORMAL HIGH (ref 11.5–15.5)
WBC: 6.2 10*3/uL (ref 4.0–10.5)
nRBC: 0 % (ref 0.0–0.2)

## 2023-12-03 MED ORDER — HEPARIN (PORCINE) 25000 UT/250ML-% IV SOLN: 2700.0000 [IU]/h | INTRAVENOUS | Status: DC

## 2023-12-03 MED ORDER — OXYCODONE HCL 5 MG PO TABS
5.0000 mg | ORAL_TABLET | ORAL | Status: DC | PRN
  Administered 2023-12-03: 10 mg via ORAL
  Administered 2023-12-04: 5 mg via ORAL
  Administered 2023-12-04 – 2023-12-05 (×2): 10 mg via ORAL
  Filled 2023-12-03 (×4): qty 2

## 2023-12-03 MED ORDER — SODIUM CHLORIDE 0.9% FLUSH: 3.0000 mL | INTRAVENOUS | Status: DC | PRN

## 2023-12-03 MED ORDER — DOCUSATE SODIUM 100 MG PO CAPS
100.0000 mg | ORAL_CAPSULE | Freq: Two times a day (BID) | ORAL | Status: DC
  Administered 2023-12-03 – 2023-12-06 (×8): 100 mg via ORAL
  Filled 2023-12-03 (×9): qty 1

## 2023-12-03 MED ORDER — SODIUM CHLORIDE 0.9% FLUSH
3.0000 mL | Freq: Two times a day (BID) | INTRAVENOUS | Status: DC
  Administered 2023-12-03 – 2023-12-04 (×3): 3 mL via INTRAVENOUS
  Administered 2023-12-05 – 2023-12-08 (×6): 10 mL via INTRAVENOUS
  Administered 2023-12-08 – 2023-12-09 (×2): 3 mL via INTRAVENOUS

## 2023-12-03 MED ORDER — INSULIN ASPART 100 UNIT/ML IJ SOLN
0.0000 [IU] | Freq: Three times a day (TID) | INTRAMUSCULAR | Status: DC
  Administered 2023-12-04 (×3): 8 [IU] via SUBCUTANEOUS

## 2023-12-03 MED ORDER — HYDROMORPHONE HCL 1 MG/ML IJ SOLN: 0.5000 mg | Freq: Once | INTRAMUSCULAR | Status: AC

## 2023-12-03 MED ORDER — METHOCARBAMOL 1000 MG/10ML IJ SOLN
1000.0000 mg | Freq: Four times a day (QID) | INTRAMUSCULAR | Status: DC
  Administered 2023-12-03 – 2023-12-06 (×13): 1000 mg via INTRAVENOUS
  Filled 2023-12-03 (×14): qty 10

## 2023-12-03 MED ORDER — DOCUSATE SODIUM 100 MG PO CAPS: 100.0000 mg | ORAL_CAPSULE | Freq: Two times a day (BID) | ORAL | Status: DC | PRN

## 2023-12-03 MED ORDER — RIVAROXABAN 20 MG PO TABS
20.0000 mg | ORAL_TABLET | Freq: Every day | ORAL | Status: DC
  Administered 2023-12-03 – 2023-12-08 (×6): 20 mg via ORAL
  Filled 2023-12-03 (×6): qty 1

## 2023-12-03 MED ORDER — HEPARIN (PORCINE) 25000 UT/250ML-% IV SOLN
2200.0000 [IU]/h | INTRAVENOUS | Status: AC
  Administered 2023-12-03: 2200 [IU]/h via INTRAVENOUS
  Filled 2023-12-03: qty 250

## 2023-12-03 MED ORDER — HYDROMORPHONE HCL 1 MG/ML IJ SOLN
1.0000 mg | Freq: Once | INTRAMUSCULAR | Status: AC
  Administered 2023-12-03: 1 mg via INTRAVENOUS
  Filled 2023-12-03: qty 1

## 2023-12-03 NOTE — Progress Notes (Signed)
  Progress Note   Patient: Steven [name] WUJ:[id number] DOB: Jun 14, 1971 DOA: 11/29/2023     4 DOS: the patient was seen and examined on 12/03/2023 at 9:10AM and 11:30AM      Brief hospital course: 53 y.o. M with hx rectal CA s/p LAR in 2022 w/ diverting ileostomy c/b post-op hematoma that eventually got infected and developed a chronic fluid collection in the pelvis, also hx PE on Xarelto who presented with worsening pelvic abscess     Assessment and Plan: *Acute on chronic pelvic abscess Having a lot of pain today.  See below re: Ileus.  WBC normal, hemoglobin slightly down to 9.3.  Tolerated dinner well last night.  Too much pain this morning for dressing change.  No vomiting - Continue Zosyn - Dilaudid and Robaxin for pain - Post-op care per surgery    AKI (acute kidney injury) (HCC) Creatinine 1.5 on admission, resolved to normal with IV fluids - Trend BMP-hold Farxiga  History of pulmonary embolus (PE) -Heparin infusion this afternoon, then resume Xarelto tonight  Colostomy in place [facility]) - Consult WOC  Uncontrolled diabetes mellitus with hyperglycemia (HCC) Slightly elevated but overall okay - Changed to carb modified diet - Continue sliding scale corrections - Hold home Farxiga and metformin  OSA (obstructive sleep apnea) - CPAP at night  Anxiety - Continue home Xanax PRN  Asthma No evidence of flare - Continue PRN home albuterol          Subjective: Patient having a lot of left-sided left upper quadrant abdominal pain today.  No vomiting.     Physical Exam: BP 134/69   Pulse (!) 114   Temp 98.5 F (36.9 C) (Oral)   Resp (!) 22   Ht 5\' 10"  (1.778 m)   Wt 88.5 kg   SpO2 93%   BMI 27.98 kg/m   Adult male, lying in bed, appears extremely uncomfortable Tachycardic, no murmurs, no lower extremity edema Respiratory rate normal, lungs clear without rales or wheezes Abdomen with severe tenderness in the left upper quadrant, moderate  distention Attention diminished by pain, affect blunted, judgment and insight appear normal, generalized weakness but symmetric strength, face symmetric, speech fluent, oriented to person, place, time   Data Reviewed: Discussed with general surgery team Glucoses low 200s to high 100s Basic metabolic panel shows creatinine, potassium, and sodium normal CBC shows hemoglobin down to 9.3, white blood cell count normal  Family Communication: Wife at the bedside    Disposition: Status is: Inpatient         Author: Alberteen Sam, MD 12/03/2023 3:08 PM  For on call review [email or web address].

## 2023-12-03 NOTE — Progress Notes (Signed)
 1 Day Post-Op perineal proctectomy Subjective: Having some urinary retention.  Pain controlled  Objective: Vital signs in last 24 hours: Temp:  [97.7 F (36.5 C)-99 F (37.2 C)] 97.9 F (36.6 C) (03/06 0524) Pulse Rate:  [80-99] 80 (03/06 0524) Resp:  [13-18] 16 (03/06 0524) BP: (95-125)/(65-74) 117/74 (03/06 0524) SpO2:  [92 %-99 %] 98 % (03/06 0524) Weight:  [88.5 kg] 88.5 kg (03/05 1336)   Intake/Output from previous day: 03/05 0701 - 03/06 0700 In: 2315.6 [P.O.:240; I.V.:1524.5; IV Piggyback:551.2] Out: 200 [Blood:200] Intake/Output this shift: No intake/output data recorded.   General appearance: alert and cooperative  Incision: no significant drainage  Lab Results:  Recent Labs    12/02/23 0438 12/03/23 0436  WBC 7.7 6.2  HGB 9.7* 9.3*  HCT 34.3* 31.3*  PLT 255 251   BMET Recent Labs    12/02/23 0438 12/03/23 0436  NA 133* 136  K 3.6 4.5  CL 108 106  CO2 16* 21*  GLUCOSE 137* 215*  BUN 22* 28*  CREATININE 1.02 1.01  CALCIUM 7.9* 8.4*   PT/INR No results for input(s): "LABPROT", "INR" in the last 72 hours. ABG No results for input(s): "PHART", "HCO3" in the last 72 hours.  Invalid input(s): "PCO2", "PO2"  MEDS, Scheduled  insulin aspart  0-15 Units Subcutaneous Q4H   mouth rinse  15 mL Mouth Rinse 4 times per day   sodium chloride flush  3-10 mL Intravenous Q12H    Studies/Results: No results found.  Assessment: s/p Procedure(s): Perineal Proctatectomy Patient Active Problem List   Diagnosis Date Noted   AKI (acute kidney injury) (HCC) 11/30/2023   Chronic pelvic abscess due to rectal stump disruption 11/29/2023   Parastomal hernia without obstruction or gangrene 11/29/2023   Colostomy in place [facility]) 11/29/2023   History of pulmonary embolus (PE) 11/29/2023   Chronic anticoagulation 11/29/2023   Intraperitoneal abscess (HCC) 11/29/2023   Peripheral neuropathy due to chemotherapy (HCC) 07/07/2022   Cancer related pain 04/30/2021    Tenesmus 04/16/2021   Chemotherapy-induced nausea 04/03/2021   Adenocarcinoma of rectum (HCC) 03/21/2021   Sensorineural hearing loss (SNHL) of both ears 01/21/2021   Gastroesophageal reflux disease 12/26/2020   Subjective hearing loss 12/18/2020   Uncontrolled diabetes mellitus with hyperglycemia (HCC) 04/26/2019   Obesity (BMI 30-39.9) 04/26/2019   Asthma    Anxiety    OSA (obstructive sleep apnea)    HLD (hyperlipidemia)      Plan: D/c purewick and use I&O cath for urinary retention Ok for reg diet Ok to restart hep gtt Can restart PO anticoagulation this evening Wound packing BID, WOC RN consulted TOC consulted    LOS: 4 days     .Vanita Panda, MD [facility], [place]    12/03/2023 7:56 AM

## 2023-12-03 NOTE — Progress Notes (Signed)
 PHARMACY - ANTICOAGULATION CONSULT NOTE  Pharmacy Consult for Heparin Indication: hx of pulmonary embolus  Allergies  Allergen Reactions   Contrast Media [Iodinated Contrast Media] Hives and Nausea Only    As a 53 year old experienced hives and nausea    Patient Measurements: Height: 5\' 10"  (177.8 cm) Weight: 88.5 kg (195 lb) IBW/kg (Calculated) : 73 Heparin Dosing Weight: TBW  Vital Signs: Temp: 97.9 F (36.6 C) (03/06 0524) Temp Source: Oral (03/06 0524) BP: 117/74 (03/06 0524) Pulse Rate: 80 (03/06 0524)  Labs: Recent Labs    11/30/23 1653 12/01/23 0501 12/01/23 0501 12/01/23 1240 12/01/23 2052 12/02/23 0438 12/03/23 0436  HGB  --  10.4*   < >  --   --  9.7* 9.3*  HCT  --  36.6*  --   --   --  34.3* 31.3*  PLT  --  301  --   --   --  255 251  APTT 41* 58*  --  48*  --   --   --   HEPARINUNFRC  --  0.13*   < > 0.13* <0.10* 0.18*  --   CREATININE  --  1.40*  --   --   --  1.02 1.01   < > = values in this interval not displayed.    Estimated Creatinine Clearance: 95.8 mL/min (by C-G formula based on SCr of 1.01 mg/dL).  Medications:  Xarelto 20mg  po daily - LD 3/1 @ 2345  Assessment: 82 yoM admitted with chronic pelvic abscess due to rectal stump disruption with perforation/intraperitoneal abscess currently being treated with antibiotics. He is on chronic Xarelto for hx recurrent VTE which is being held just in case he ends up requiring surgical intervention.  Baseline labs: Hg slightly low but appears to be pt's baseline, pltc WNL, Scr WNL. Coag labs pending.  Today, 12/03/23 Surgery note indicated ok to resume heparin this Am and resume oral anticoagulation this PM.  Messaged Dr. Maryfrances Bunnell, who agreed with above plan  Hgb 9.3 stable after post-op , plts 251    Goal of Therapy:  Heparin level 0.3-0.7 units/ml aPTT 66-102 seconds Monitor platelets by anticoagulation protocol: Yes   Plan:  Resume heparin at  2200 units/hr until 1700 Then stop heparin drip  and resume Xarelto 20 mg PO daily with food  Monitor Scr, CBC  Monitor for signs and symptoms of bleeding    Adalberto Cole, PharmD, BCPS 12/03/2023 9:42 AM

## 2023-12-03 NOTE — Plan of Care (Signed)

## 2023-12-03 NOTE — Consult Note (Signed)
 WOC consulted for dressing, deep packing. Bedside nursing should be able to teach family, we are not always available when family are on site as well.  Routine packing, kerlix.  3x3x8cm, updated orders for nursing staff.  Requested clarification on shower vs. Flushing wound, ? Sitz bath could be considered as well.   Patient is known to [facility] nursing team however from his previous needs for ostomy teaching and care, he has had stoma since 2022. Provided staff with [employer] for the ostomy products we have inpatient for use should the patient need to change pouch while admitted.  Notified bedside nursing staff ok for them to teach patient and CG based on CG availability and plans for DC   Patient has private insurance and should be able to get wound care supplies from the same DME company that he orders his ostomy supplies from. Notified staff of same  Discussed POC with bedside nurse.  Re consult if needed, will not follow at this time. Thanks  Shavontae Gibeault [employer], RN,CWOCN, CNS, CWON-AP [phone])

## 2023-12-03 NOTE — Progress Notes (Signed)
   12/03/23 2113  BiPAP/CPAP/SIPAP  BiPAP/CPAP/SIPAP Pt Type Adult  Reason BIPAP/CPAP not in use Non-compliant (Eqipment remains at bedside should he change his mind.)

## 2023-12-03 NOTE — Progress Notes (Addendum)
 Was contacted about ongoing LUQ pain and went to the bedside between 2-3 pm  Patient reports ongoing left upper abdominal discomfort, worse with deep inspiration. He denies nausea. Per wife he has been belching. Patient unable to tell me what his colostomy has been doing but there is gas and minimal liquid stool in pouch.   On exam his abdomen is slightly more distended compared to this morning. There is tympany of the upper abdomen. There is no guarding and no peritonitis. Stoma pink and viable, parastomal hernia remains soft.   KUB w/ mild distention of the stomach and small bowel loops, particularly in the left abdomen.   No emergent surgical needs based on exam, vitals, x-ray. Keep an eye on HR and fluid status. He may have a mild post-operative ileus. If his distention worsens then he may need an NG Tube but I would try to hold off for now.   Remove external cath if possible.  Ok to defer packing removal/dressing change to tomorrow AM 3/7.    Hosie Spangle, PA-C [facility] Please see Amion for pager number during day hours 7:00am-4:30pm

## 2023-12-03 NOTE — Progress Notes (Addendum)
 Re: Wound care to Perineum.  Per Dr. Maisie Fus (Surgeon), the patient can shower with dressing off. Need someone who is very familiar with wound packing to do his first dressing changes as there is unprotected small bowel at the bottom of the wound.   Melody RN Wound Care Nurse attempted to do the initial dressing change with the writer at the bedside for teaching and education of wound maintenance. However, the patient refused to have his dressing changed because he is having pain 7/10 to left side of abdomen. Attending , Dr. Maryfrances Bunnell is aware of refusal of dressing change and c/o' pain.   Per [facility], the pain could be just be mild reactive ileus. monitor today and worry about ileus vs constipation in a day or two.   X-Ray ordered; Per Lanora Manis PA showed nothing emergent on his x-ray. However he does have some bowel dilation consistent with ileus, reactive to surgery. and some old stool in his right colon. I backed his diet off to clears.   Dr. Maisie Fus, [place] PA, and Dr. Maryfrances Bunnell are aware of refusal of dressing change.

## 2023-12-03 NOTE — Plan of Care (Signed)
  Problem: Education: Goal: Ability to describe self-care measures that may prevent or decrease complications (Diabetes Survival Skills Education) will improve Outcome: Progressing   Problem: Coping: Goal: Ability to adjust to condition or change in health will improve Outcome: Progressing

## 2023-12-03 NOTE — Progress Notes (Signed)
   12/03/23 1419  TOC Brief Assessment  Insurance and Status Reviewed  Patient has primary care physician Yes Tiburcio Pea, Tawni Pummel, MD)  Home environment has been reviewed yes from home  Prior level of function: Independent  Prior/Current Home Services No current home services  Social Drivers of Health Review SDOH reviewed no interventions necessary  Transition of care needs no transition of care needs at this time

## 2023-12-04 DIAGNOSIS — K651 Peritoneal abscess: Secondary | ICD-10-CM | POA: Diagnosis not present

## 2023-12-04 LAB — COMPREHENSIVE METABOLIC PANEL
ALT: 15 U/L (ref 0–44)
AST: 24 U/L (ref 15–41)
Albumin: 2.5 g/dL — ABNORMAL LOW (ref 3.5–5.0)
Alkaline Phosphatase: 49 U/L (ref 38–126)
Anion gap: 18 — ABNORMAL HIGH (ref 5–15)
BUN: 41 mg/dL — ABNORMAL HIGH (ref 6–20)
CO2: 19 mmol/L — ABNORMAL LOW (ref 22–32)
Calcium: 8.2 mg/dL — ABNORMAL LOW (ref 8.9–10.3)
Chloride: 102 mmol/L (ref 98–111)
Creatinine, Ser: 2.12 mg/dL — ABNORMAL HIGH (ref 0.61–1.24)
GFR, Estimated: 37 mL/min — ABNORMAL LOW (ref >60)
Glucose, Bld: 260 mg/dL — ABNORMAL HIGH (ref 70–99)
Potassium: 4.8 mmol/L (ref 3.5–5.1)
Sodium: 139 mmol/L (ref 135–145)
Total Bilirubin: 1.3 mg/dL — ABNORMAL HIGH (ref 0.0–1.2)
Total Protein: 6.4 g/dL — ABNORMAL LOW (ref 6.5–8.1)

## 2023-12-04 LAB — CBC
HCT: 46.3 % (ref 39.0–52.0)
Hemoglobin: 13 g/dL (ref 13.0–17.0)
MCH: 20.8 pg — ABNORMAL LOW (ref 26.0–34.0)
MCHC: 28.1 g/dL — ABNORMAL LOW (ref 30.0–36.0)
MCV: 74.2 fL — ABNORMAL LOW (ref 80.0–100.0)
Platelets: 454 10*3/uL — ABNORMAL HIGH (ref 150–400)
RBC: 6.24 MIL/uL — ABNORMAL HIGH (ref 4.22–5.81)
RDW: 20.1 % — ABNORMAL HIGH (ref 11.5–15.5)
WBC: 11.2 10*3/uL — ABNORMAL HIGH (ref 4.0–10.5)
nRBC: 0 % (ref 0.0–0.2)

## 2023-12-04 LAB — GLUCOSE, CAPILLARY
Glucose-Capillary: 217 mg/dL — ABNORMAL HIGH (ref 70–99)
Glucose-Capillary: 250 mg/dL — ABNORMAL HIGH (ref 70–99)
Glucose-Capillary: 254 mg/dL — ABNORMAL HIGH (ref 70–99)
Glucose-Capillary: 263 mg/dL — ABNORMAL HIGH (ref 70–99)
Glucose-Capillary: 265 mg/dL — ABNORMAL HIGH (ref 70–99)
Glucose-Capillary: 272 mg/dL — ABNORMAL HIGH (ref 70–99)

## 2023-12-04 LAB — SURGICAL PATHOLOGY

## 2023-12-04 MED ORDER — ACETAMINOPHEN 325 MG PO TABS
650.0000 mg | ORAL_TABLET | Freq: Four times a day (QID) | ORAL | Status: AC | PRN
Start: 2023-12-04 — End: 2023-12-04
  Administered 2023-12-04 (×2): 650 mg via ORAL
  Filled 2023-12-04 (×2): qty 2

## 2023-12-04 MED ORDER — INSULIN ASPART 100 UNIT/ML IJ SOLN
0.0000 [IU] | Freq: Every day | INTRAMUSCULAR | Status: DC
Start: 2023-12-04 — End: 2023-12-09
  Administered 2023-12-04: 2 [IU] via SUBCUTANEOUS

## 2023-12-04 MED ORDER — SODIUM CHLORIDE 0.9 % IV SOLN: INTRAVENOUS | Status: AC

## 2023-12-04 MED ORDER — INSULIN ASPART 100 UNIT/ML IJ SOLN: 0.0000 [IU] | Freq: Three times a day (TID) | INTRAMUSCULAR | Status: DC

## 2023-12-04 MED ORDER — INSULIN ASPART 100 UNIT/ML IJ SOLN
0.0000 [IU] | Freq: Three times a day (TID) | INTRAMUSCULAR | Status: DC
  Administered 2023-12-05: 4 [IU] via SUBCUTANEOUS
  Administered 2023-12-05 (×2): 7 [IU] via SUBCUTANEOUS
  Administered 2023-12-06 – 2023-12-07 (×4): 4 [IU] via SUBCUTANEOUS
  Administered 2023-12-07: 7 [IU] via SUBCUTANEOUS
  Administered 2023-12-07 – 2023-12-08 (×3): 4 [IU] via SUBCUTANEOUS
  Administered 2023-12-08: 7 [IU] via SUBCUTANEOUS
  Administered 2023-12-09 (×2): 4 [IU] via SUBCUTANEOUS

## 2023-12-04 NOTE — Plan of Care (Signed)

## 2023-12-04 NOTE — Consult Note (Signed)
 WOC Nurse Consult Note: Reason for Consult: teach patient's wife and nursing staff to perform dressing change  Wound type: surgical  Pressure Injury POA: NA Measurement: 3cm x 3cm x 8cm  Wound bed: unable to visualize base, but otherwise appears clean Drainage (amount, consistency, odor) serosanguinous  Periwound: intact, erythematous, purple area to the right of the wound edge.  Dressing procedure/placement/frequency: Removed packing, replaced packing with 2.5 inch kerlix gauze moistened with saline. Wife observed dressing change. Cover with 4x4 gauze and peripad. Using mesh underwear to hold dressings in place.   Requested nurse to go to room to see the packing I removed to get an idea of the amount needed for packing. Packing left on wash cloth in the room, explained rationale to patient's wife and alerted nurse as well.   Will need dressing supplies for home.   2.5" kerlix rolls (BID dressing) 4x4 gauze topper ABD pads or peripads.   2 pair of mesh underwear in the room already  Requested ostomy supplies for patient yesterday, per [place] they were delivered, they are not in the room today.  Requested supplies be taken to patient room  Nursing staff should be able to pack this wound, wife also has been instructed and verbalized understanding.   Discussed POC with patient and bedside nurse.  Re consult if needed, will not follow at this time. Thanks  Anyjah Roundtree [employer], RN,CWOCN, CNS, CWON-AP [phone])

## 2023-12-04 NOTE — Progress Notes (Signed)
   12/04/23 2007  BiPAP/CPAP/SIPAP  BiPAP/CPAP/SIPAP Pt Type Adult (Prefers self placement)  BiPAP/CPAP/SIPAP Resmed  Mask Type Full face mask  Dentures removed? Not applicable  Mask Size Large  FiO2 (%) 21 %  Patient Home Equipment No  Auto Titrate Yes (4-18)  CPAP/SIPAP surface wiped down Yes  BiPAP/CPAP /SiPAP Vitals  Pulse Rate 98  Resp 17  SpO2 96 %  Bilateral Breath Sounds Clear;Diminished  MEWS Score/Color  MEWS Score 0  MEWS Score Color Green

## 2023-12-04 NOTE — Progress Notes (Signed)
  Progress Note   Patient: Steven [name] [id number] DOB: 01/10/1971 DOA: 11/29/2023     5 DOS: the patient was seen and examined on 12/04/2023 at 11:20AM      Brief hospital course: 53 year old history of rectal cancer, LAR, c/b chronic abscess, presenting with acute on chronic abscess, now status post abscess drainage.  Postop course complicated by mild ileus.     Assessment and Plan: * Chronic pelvic abscess due to rectal stump disruption X-ray yesterday showed mild ileus, this seems to be better today.  He is tolerating clears. - Continue Zosyn - Postop care per surgery      AKI (acute kidney injury) (HCC) Creatinine doubled from 1 to 2.12 mg/dL.  Making urine. - Resume IV fluids -Avoid nephrotoxins and hypotension - Trend creatinine  History of pulmonary embolus (PE) -Continue Xarelto - Trend CBC  Colostomy in place [facility]) - Consult WOC  Uncontrolled diabetes mellitus with hyperglycemia (HCC) Glucoses high 200s all day - Continue sliding scale corrections, increase dose - Hold Farxiga  OSA (obstructive sleep apnea) - CPAP    Anxiety - Continue home Xanax PRN  Asthma No evidence of flare - Continue PRN home albuterol          Subjective: Patient's pain is somewhat better today.  He is still in a lot of discomfort.  Slight BM in bag.  No fever.     Physical Exam: BP 119/66   Pulse (!) 109   Temp (!) 97.1 F (36.2 C)   Resp 18   Ht 5\' 10"  (1.778 m)   Wt 88.5 kg   SpO2 94%   BMI 27.98 kg/m   Adult male, lying in bed, interactive and appropriate but very tired and seems uncomfortable, sitting very still Tachycardic, regular, no JVD, no peripheral edema Respiratory rate normal, lungs clear without rales or wheeze Abdomen tender throughout, guarding noted Attention normal, affect normal, judgment and insight appear normal    Data Reviewed: Basic metabolic panel shows creatinine up to 2.12, potassium normal Hemoglobin stable, white  count up slightly up  Family Communication: wife At the bedside    Disposition: Status is: Inpatient         Author: Alberteen Sam, MD 12/04/2023 5:48 PM  For on call review [email or web address].

## 2023-12-04 NOTE — Progress Notes (Signed)
 Patient refused wound care, awaiting WOC to perform  first dressing change and teaching with spouse.Marland Kitchen

## 2023-12-04 NOTE — Plan of Care (Signed)
  Problem: Coping: Goal: Ability to adjust to condition or change in health will improve Outcome: Progressing   Problem: Clinical Measurements: Goal: Ability to maintain clinical measurements within normal limits will improve Outcome: Progressing   Problem: Pain Managment: Goal: General experience of comfort will improve and/or be controlled Outcome: Progressing   Problem: Safety: Goal: Ability to remain free from injury will improve Outcome: Progressing

## 2023-12-04 NOTE — Plan of Care (Signed)
   Problem: Education: Goal: Ability to describe self-care measures that may prevent or decrease complications (Diabetes Survival Skills Education) will improve Outcome: Progressing Goal: Individualized Educational Video(s) Outcome: Progressing   Problem: Coping: Goal: Ability to adjust to condition or change in health will improve Outcome: Progressing

## 2023-12-04 NOTE — Progress Notes (Signed)
 2 Days Post-Op perineal proctectomy Subjective: Had an episode of acute pain.  Most likely related to pSBO.  Pain controlled this AM  Objective: Vital signs in last 24 hours: Temp:  [97.8 F (36.6 C)-99.2 F (37.3 C)] 97.8 F (36.6 C) (03/07 0457) Pulse Rate:  [104-116] 110 (03/06 2028) Resp:  [18-22] 18 (03/07 0457) BP: (110-143)/(69-82) 110/70 (03/07 0457) SpO2:  [93 %-98 %] 95 % (03/07 0457)   Intake/Output from previous day: 03/06 0701 - 03/07 0700 In: 657.2 [P.O.:340; I.V.:91.2; IV Piggyback:226.1] Out: 1800 [Urine:1800] Intake/Output this shift: No intake/output data recorded.   General appearance: alert and cooperative  Incision:some drainage present  Lab Results:  Recent Labs    12/03/23 0436 12/04/23 0409  WBC 6.2 11.2*  HGB 9.3* 13.0  HCT 31.3* 46.3  PLT 251 454*   BMET Recent Labs    12/03/23 0436 12/04/23 0409  NA 136 139  K 4.5 4.8  CL 106 102  CO2 21* 19*  GLUCOSE 215* 260*  BUN 28* 41*  CREATININE 1.01 2.12*  CALCIUM 8.4* 8.2*   PT/INR No results for input(s): "LABPROT", "INR" in the last 72 hours. ABG No results for input(s): "PHART", "HCO3" in the last 72 hours.  Invalid input(s): "PCO2", "PO2"  MEDS, Scheduled  docusate sodium  100 mg Oral BID   insulin aspart  0-15 Units Subcutaneous TID WC   methocarbamol (ROBAXIN) injection  1,000 mg Intravenous Q6H   mouth rinse  15 mL Mouth Rinse 4 times per day   rivaroxaban  20 mg Oral Q supper   sodium chloride flush  3-10 mL Intravenous Q12H    Studies/Results: DG Abd Portable 1V Result Date: 12/03/2023 CLINICAL DATA:  Postoperative abdominal pain. EXAM: PORTABLE ABDOMEN - 1 VIEW COMPARISON:  None Available. FINDINGS: Mildly dilated small bowel loops are noted without definite colonic dilatation. IMPRESSION: Mildly dilated small bowel loops are noted most likely representing postoperative ileus, although distal small bowel obstruction cannot be excluded. Electronically Signed   By: Lupita Raider M.D.   On: 12/03/2023 12:11    Assessment: s/p Procedure(s): Perineal Proctatectomy Patient Active Problem List   Diagnosis Date Noted   AKI (acute kidney injury) (HCC) 11/30/2023   Chronic pelvic abscess due to rectal stump disruption 11/29/2023   Parastomal hernia without obstruction or gangrene 11/29/2023   Colostomy in place [facility]) 11/29/2023   History of pulmonary embolus (PE) 11/29/2023   Chronic anticoagulation 11/29/2023   Intraperitoneal abscess (HCC) 11/29/2023   Peripheral neuropathy due to chemotherapy (HCC) 07/07/2022   Cancer related pain 04/30/2021   Tenesmus 04/16/2021   Chemotherapy-induced nausea 04/03/2021   Adenocarcinoma of rectum (HCC) 03/21/2021   Sensorineural hearing loss (SNHL) of both ears 01/21/2021   Gastroesophageal reflux disease 12/26/2020   Subjective hearing loss 12/18/2020   Uncontrolled diabetes mellitus with hyperglycemia (HCC) 04/26/2019   Obesity (BMI 30-39.9) 04/26/2019   Asthma    Anxiety    OSA (obstructive sleep apnea)    HLD (hyperlipidemia)      Plan: Ambulate in hall [facility] for clears Ok to restart hep gtt Wound packing BID, WOC RN consulted TOC consulted    LOS: 5 days     .Vanita Panda, MD [facility], [place]    12/04/2023 7:37 AM

## 2023-12-05 ENCOUNTER — Inpatient Hospital Stay (HOSPITAL_COMMUNITY)

## 2023-12-05 DIAGNOSIS — K651 Peritoneal abscess: Secondary | ICD-10-CM | POA: Diagnosis not present

## 2023-12-05 LAB — CULTURE, BLOOD (ROUTINE X 2)
Culture: NO GROWTH
Culture: NO GROWTH

## 2023-12-05 LAB — BASIC METABOLIC PANEL
Anion gap: 12 (ref 5–15)
BUN: 47 mg/dL — ABNORMAL HIGH (ref 6–20)
CO2: 18 mmol/L — ABNORMAL LOW (ref 22–32)
Calcium: 8.3 mg/dL — ABNORMAL LOW (ref 8.9–10.3)
Chloride: 102 mmol/L (ref 98–111)
Creatinine, Ser: 1.56 mg/dL — ABNORMAL HIGH (ref 0.61–1.24)
GFR, Estimated: 53 mL/min — ABNORMAL LOW (ref >60)
Glucose, Bld: 237 mg/dL — ABNORMAL HIGH (ref 70–99)
Potassium: 4.1 mmol/L (ref 3.5–5.1)
Sodium: 132 mmol/L — ABNORMAL LOW (ref 135–145)

## 2023-12-05 LAB — CBC
HCT: 36.1 % — ABNORMAL LOW (ref 39.0–52.0)
Hemoglobin: 10.5 g/dL — ABNORMAL LOW (ref 13.0–17.0)
MCH: 20.7 pg — ABNORMAL LOW (ref 26.0–34.0)
MCHC: 29.1 g/dL — ABNORMAL LOW (ref 30.0–36.0)
MCV: 71.2 fL — ABNORMAL LOW (ref 80.0–100.0)
Platelets: 332 10*3/uL (ref 150–400)
RBC: 5.07 MIL/uL (ref 4.22–5.81)
RDW: 18.9 % — ABNORMAL HIGH (ref 11.5–15.5)
WBC: 8.5 10*3/uL (ref 4.0–10.5)
nRBC: 0 % (ref 0.0–0.2)

## 2023-12-05 LAB — GLUCOSE, CAPILLARY
Glucose-Capillary: 205 mg/dL — ABNORMAL HIGH (ref 70–99)
Glucose-Capillary: 210 mg/dL — ABNORMAL HIGH (ref 70–99)
Glucose-Capillary: 166 mg/dL — ABNORMAL HIGH (ref 70–99)
Glucose-Capillary: 150 mg/dL — ABNORMAL HIGH (ref 70–99)

## 2023-12-05 MED ORDER — PROMETHAZINE (PHENERGAN) 6.25MG IN NS 50ML IVPB
6.2500 mg | Freq: Once | INTRAVENOUS | Status: AC
  Administered 2023-12-05: 6.25 mg via INTRAVENOUS
  Filled 2023-12-05: qty 6.25

## 2023-12-05 MED ORDER — SODIUM CHLORIDE 0.9 % IV SOLN: INTRAVENOUS | Status: DC

## 2023-12-05 MED ORDER — LORAZEPAM 2 MG/ML IJ SOLN
1.0000 mg | Freq: Once | INTRAMUSCULAR | Status: AC | PRN
  Administered 2023-12-05: 1 mg via INTRAVENOUS
  Filled 2023-12-05: qty 1

## 2023-12-05 MED ORDER — ACETAMINOPHEN 325 MG PO TABS
650.0000 mg | ORAL_TABLET | Freq: Four times a day (QID) | ORAL | Status: DC | PRN
  Administered 2023-12-05 – 2023-12-09 (×10): 650 mg via ORAL
  Filled 2023-12-05 (×11): qty 2

## 2023-12-05 NOTE — Progress Notes (Signed)
 Pt noted to be vomiting at 1304. of tea colored liquid. Zofran 4mg  given as ordered. At 1345 message left for [facility] [place] [facility] MD to call to inform. At 1405 Dr.Cornett returned call informed of pt vomiting. Verbal order given to place NGT to low intermediate suction.1545 myself and the charge nurse attempted to place x2 without success. First attempt was met with resistance. Second attempt NGT was advanced to 40cm them pt began to vomit and vomited up the tube and small amount of blood despite having Ativan and phenergan on board. At 1605 Dr. Carolynne Edouard notified of unsuccessful attempts to place NGT.

## 2023-12-05 NOTE — Progress Notes (Signed)
   12/05/23 1816  BiPAP/CPAP/SIPAP  Reason BIPAP/CPAP not in use Other(comment) (pt states he cant tolerate)  BiPAP/CPAP /SiPAP Vitals  Temp 97.6 F (36.4 C)  Pulse Rate (!) 101  Resp 19  BP 128/77  SpO2 91 %  MEWS Score/Color  MEWS Score 1  MEWS Score Color Green

## 2023-12-05 NOTE — Progress Notes (Signed)
 3 Days Post-Op   Subjective/Chief Complaint: Patient complains of pain in the buttock.  Dressing is being changed by nursing staff.  Sitting up in bed.   Objective: Vital signs in last 24 hours: Temp:  [97.1 F (36.2 C)-97.9 F (36.6 C)] 97.9 F (36.6 C) (03/08 0527) Pulse Rate:  [98-109] 102 (03/08 0527) Resp:  [17-20] 18 (03/08 0527) BP: (119-123)/(66-80) 119/80 (03/08 0527) SpO2:  [94 %-96 %] 95 % (03/08 0527) FiO2 (%):  [21 %] 21 % (03/07 2007) Last BM Date : 12/04/23  Intake/Output from previous day: 03/07 0701 - 03/08 0700 In: 2240 [P.O.:240; I.V.:2000] Out: 1900 [Urine:1900] Intake/Output this shift: No intake/output data recorded.  General appearance: alert and cooperative Incision:some drainage present  Lab Results:  Recent Labs    12/04/23 0409 12/05/23 0516  WBC 11.2* 8.5  HGB 13.0 10.5*  HCT 46.3 36.1*  PLT 454* 332   BMET Recent Labs    12/04/23 0409 12/05/23 0516  NA 139 132*  K 4.8 4.1  CL 102 102  CO2 19* 18*  GLUCOSE 260* 237*  BUN 41* 47*  CREATININE 2.12* 1.56*  CALCIUM 8.2* 8.3*   PT/INR No results for input(s): "LABPROT", "INR" in the last 72 hours. ABG No results for input(s): "PHART", "HCO3" in the last 72 hours.  Invalid input(s): "PCO2", "PO2"  Studies/Results: DG Abd Portable 1V Result Date: 12/03/2023 CLINICAL DATA:  Postoperative abdominal pain. EXAM: PORTABLE ABDOMEN - 1 VIEW COMPARISON:  None Available. FINDINGS: Mildly dilated small bowel loops are noted without definite colonic dilatation. IMPRESSION: Mildly dilated small bowel loops are noted most likely representing postoperative ileus, although distal small bowel obstruction cannot be excluded. Electronically Signed   By: Lupita Raider M.D.   On: 12/03/2023 12:11    Anti-infectives: Anti-infectives (From admission, onward)    Start     Dose/Rate Route Frequency Ordered Stop   11/29/23 2200  piperacillin-tazobactam (ZOSYN) IVPB 3.375 g        3.375 g 12.5 mL/hr  over 240 Minutes Intravenous Every 8 hours 11/29/23 2147 12/04/23 1810   11/29/23 2130  metroNIDAZOLE (FLAGYL) IVPB 500 mg  Status:  Discontinued        500 mg 100 mL/hr over 60 Minutes Intravenous  Once 11/29/23 2119 11/29/23 2147   11/29/23 2130  ceFEPIme (MAXIPIME) 2 g in sodium chloride 0.9 % 100 mL IVPB  Status:  Discontinued        2 g 200 mL/hr over 30 Minutes Intravenous Every 24 hours 11/29/23 2126 11/29/23 2147       Assessment/Plan: s/p Procedure(s) with comments: Perineal Proctatectomy (N/A) - Completion Proctatectomy Question ileus versus PSBO-no vomiting.  Check KUB  Tolerating clear liquids for now.  Make n.p.o. if he develops nausea or vomiting.  Continue wound care  Recommend ambulation.  PT has been ordered for the patient.  LOS: 6 days    Steven Fus A Raini Tiley md  12/05/2023

## 2023-12-05 NOTE — Progress Notes (Signed)
   12/05/23 2218  BiPAP/CPAP/SIPAP  Reason BIPAP/CPAP not in use Non-compliant

## 2023-12-05 NOTE — Progress Notes (Signed)
  Progress Note   Patient: Steven [name] [id number]:[id number] DOB: [birth year] DOA: 11/29/2023     6 DOS: the patient was seen and examined on 12/05/2023 at 1:20PM      Brief hospital course: Hx rectal cancer and chronic abscess, presented with acute on chronic abscess, s/p surgery, now with ileus.     Assessment and Plan: * Chronic pelvic abscess due to rectal stump disruption S/p abscess drainage in the OR Ileus - Continue Zosyn - NG tube - IV fluids   AKI (acute kidney injury) (HCC) Cr improved to 1.5 today - Continue IVF - Trend BMP  History of pulmonary embolus (PE) - Continue Xarelto unless NG placed, then start heparin gtt   Colostomy in place [facility]) - Consult WOC  Uncontrolled diabetes mellitus with hyperglycemia (HCC) Glucoses better on higher SSI scale  - Continue sliding scale corrections - Hold home Farxiga  OSA (obstructive sleep apnea) - CPAP at night  Anxiety - Continue home Xanax PRN  Asthma No evidence of flare - Continue PRN home albuterol          Subjective: Abdomen is distended.  He vomited this morning.  He said no fever, no other focal symptoms.     Physical Exam: BP 119/80 (BP Location: Left Arm)   Pulse (!) 102   Temp 97.9 F (36.6 C) (Oral)   Resp 18   Ht 5\' 10"  (1.778 m)   Wt 88.5 kg   SpO2 95%   BMI 27.98 kg/m   Male, interactive and appropriate but tired, uncomfortable, lying in bed Heart rate regular but tachycardic, no JVD, no peripheral edema Respiratory normal, lungs clear, no rales or wheezes Abdomen somewhat distended, guarding, tender throughout Attention normal, affect guarded, judgment and insight appear normal, upper extremity strength appears normal and symmetric    Data Reviewed: Discussed with general surgery Basic metabolic panel shows mild hyponatremia Creatinine is down to 1.56 CBC shows normal white count, hemoglobin 10.5 X-ray personally reviewed, shows further dilation of small bowel  Family  Communication: Wife at the bedside    Disposition: Status is: Inpatient         Author: Alberteen Sam, MD 12/05/2023 5:40 PM  For on call review [email or web address].

## 2023-12-05 NOTE — Plan of Care (Signed)
  Problem: Health Behavior/Discharge Planning: Goal: Ability to identify and utilize available resources and services will improve Outcome: Progressing   Problem: Metabolic: Goal: Ability to maintain appropriate glucose levels will improve Outcome: Progressing   Problem: Clinical Measurements: Goal: Will remain free from infection Outcome: Progressing Goal: Respiratory complications will improve Outcome: Progressing Goal: Cardiovascular complication will be avoided Outcome: Progressing   Problem: Elimination: Goal: Will not experience complications related to urinary retention Outcome: Progressing   Problem: Pain Managment: Goal: General experience of comfort will improve and/or be controlled Outcome: Progressing   Problem: Safety: Goal: Ability to remain free from injury will improve Outcome: Progressing   Problem: Skin Integrity: Goal: Risk for impaired skin integrity will decrease Outcome: Progressing

## 2023-12-06 DIAGNOSIS — K651 Peritoneal abscess: Secondary | ICD-10-CM | POA: Diagnosis not present

## 2023-12-06 LAB — CBC
HCT: 35.4 % — ABNORMAL LOW (ref 39.0–52.0)
Hemoglobin: 10.1 g/dL — ABNORMAL LOW (ref 13.0–17.0)
MCH: 20.7 pg — ABNORMAL LOW (ref 26.0–34.0)
MCHC: 28.5 g/dL — ABNORMAL LOW (ref 30.0–36.0)
MCV: 72.7 fL — ABNORMAL LOW (ref 80.0–100.0)
Platelets: 332 10*3/uL (ref 150–400)
RBC: 4.87 MIL/uL (ref 4.22–5.81)
RDW: 18.8 % — ABNORMAL HIGH (ref 11.5–15.5)
WBC: 6.4 10*3/uL (ref 4.0–10.5)
nRBC: 0 % (ref 0.0–0.2)

## 2023-12-06 LAB — GLUCOSE, CAPILLARY
Glucose-Capillary: 189 mg/dL — ABNORMAL HIGH (ref 70–99)
Glucose-Capillary: 191 mg/dL — ABNORMAL HIGH (ref 70–99)
Glucose-Capillary: 156 mg/dL — ABNORMAL HIGH (ref 70–99)
Glucose-Capillary: 125 mg/dL — ABNORMAL HIGH (ref 70–99)

## 2023-12-06 LAB — BASIC METABOLIC PANEL
Anion gap: 14 (ref 5–15)
BUN: 32 mg/dL — ABNORMAL HIGH (ref 6–20)
CO2: 19 mmol/L — ABNORMAL LOW (ref 22–32)
Calcium: 8 mg/dL — ABNORMAL LOW (ref 8.9–10.3)
Chloride: 101 mmol/L (ref 98–111)
Creatinine, Ser: 1.03 mg/dL (ref 0.61–1.24)
GFR, Estimated: >60 mL/min (ref >60)
Glucose, Bld: 214 mg/dL — ABNORMAL HIGH (ref 70–99)
Potassium: 3.7 mmol/L (ref 3.5–5.1)
Sodium: 134 mmol/L — ABNORMAL LOW (ref 135–145)

## 2023-12-06 NOTE — Progress Notes (Signed)
 Patient colostomy 800cc output last night.  Requesting advanced diet orders.  Currently tolerating clear liquids.  Dr. Luisa Hart paged requesting change to diet orders.

## 2023-12-06 NOTE — Progress Notes (Signed)
   12/06/23 2153  BiPAP/CPAP/SIPAP  BiPAP/CPAP/SIPAP Pt Type Adult (Prefer self placement, machine all ready to be worn)  BiPAP/CPAP/SIPAP Resmed  Mask Type Full face mask  Dentures removed? Not applicable  Mask Size Large  FiO2 (%) 21 %  Patient Home Equipment No  Auto Titrate Yes (18/4)  CPAP/SIPAP surface wiped down Yes  BiPAP/CPAP /SiPAP Vitals  Pulse Rate 100  Resp 20  SpO2 94 %  Bilateral Breath Sounds Clear;Diminished  MEWS Score/Color  MEWS Score 0  MEWS Score Color Green

## 2023-12-06 NOTE — Progress Notes (Signed)
 4 Days Post-Op   Subjective/Chief Complaint: Feels better today. Ostomy started putting out a lot overnight   Objective: Vital signs in last 24 hours: Temp:  [97.6 F (36.4 C)-98 F (36.7 C)] 98 F (36.7 C) (03/09 0746) Pulse Rate:  [94-101] 94 (03/09 0746) Resp:  [18-19] 18 (03/09 0746) BP: (124-128)/(67-77) 124/67 (03/09 0746) SpO2:  [91 %-97 %] 97 % (03/09 0746) Last BM Date : 12/04/23  Intake/Output from previous day: 03/08 0701 - 03/09 0700 In: 1409.2 [P.O.:360; I.V.:1049.2] Out: 3750 [Urine:2950; Stool:800] Intake/Output this shift: No intake/output data recorded.  General appearance: alert and cooperative Resp: clear to auscultation bilaterally Cardio: regular rate and rhythm GI: soft, mild tenderness. Ostomy pink and productive  Lab Results:  Recent Labs    12/05/23 0516 12/06/23 0504  WBC 8.5 6.4  HGB 10.5* 10.1*  HCT 36.1* 35.4*  PLT 332 332   BMET Recent Labs    12/05/23 0516 12/06/23 0504  NA 132* 134*  K 4.1 3.7  CL 102 101  CO2 18* 19*  GLUCOSE 237* 214*  BUN 47* 32*  CREATININE 1.56* 1.03  CALCIUM 8.3* 8.0*   PT/INR No results for input(s): "LABPROT", "INR" in the last 72 hours. ABG No results for input(s): "PHART", "HCO3" in the last 72 hours.  Invalid input(s): "PCO2", "PO2"  Studies/Results: DG Abd 1 View Result Date: 12/05/2023 CLINICAL DATA:  [id number] Post-op pain [id number] EXAM: ABDOMEN - 1 VIEW COMPARISON:  December 03, 2023 FINDINGS: Revisualization of diffuse gaseous dilation of loops of small bowel. Extent of dilation appears increased since most recent prior. No air is visualized in the rectum; mottled lucency overlying the area of the anus likely reflecting known abscess. Dilated loop of small bowel measures approximately 4.5 cm in diameter. Increased gaseous distension of the stomach. IMPRESSION: Increased gaseous dilation of loops of small bowel. Findings are concerning for small bowel obstruction. Electronically Signed   By: Meda Klinefelter M.D.   On: 12/05/2023 12:28    Anti-infectives: Anti-infectives (From admission, onward)    Start     Dose/Rate Route Frequency Ordered Stop   11/29/23 2200  piperacillin-tazobactam (ZOSYN) IVPB 3.375 g        3.375 g 12.5 mL/hr over 240 Minutes Intravenous Every 8 hours 11/29/23 2147 12/04/23 1810   11/29/23 2130  metroNIDAZOLE (FLAGYL) IVPB 500 mg  Status:  Discontinued        500 mg 100 mL/hr over 60 Minutes Intravenous  Once 11/29/23 2119 11/29/23 2147   11/29/23 2130  ceFEPIme (MAXIPIME) 2 g in sodium chloride 0.9 % 100 mL IVPB  Status:  Discontinued        2 g 200 mL/hr over 30 Minutes Intravenous Every 24 hours 11/29/23 2126 11/29/23 2147       Assessment/Plan: s/p Procedure(s) with comments: Perineal Proctatectomy (N/A) - Completion Proctatectomy Advance diet as tolerated Wound care Ambulate PT Ileus seems to be resolving POD 4  LOS: 7 days    Chevis Pretty III 12/06/2023

## 2023-12-06 NOTE — Progress Notes (Signed)
  Progress Note   Patient: Steven [name] ZOX:[id number] DOB: 03/01/1971 DOA: 11/29/2023     7 DOS: the patient was seen and examined on 12/06/2023 at 1:20PM      Brief hospital course: Hx rectal cancer and chronic abscess, presented with acute on chronic abscess, s/p surgery, now with ileus.     Assessment and Plan: * Chronic pelvic abscess due to rectal stump disruption S/p abscess drainage in the OR Ileus Ileus better, good output in ostomy today, tolerated FLD - Continue Zosyn - IV fluids   AKI (acute kidney injury) (HCC) AKI resolved - Stop fluids  History of pulmonary embolus (PE) - Continue Xarelto   Uncontrolled diabetes mellitus with hyperglycemia (HCC) Glucose controlled - Continue sliding scale corrections  OSA (obstructive sleep apnea) - CPAP at night  Anxiety - Continue home Xanax PRN  Asthma No evidence of flare - Continue PRN home albuterol          Subjective: Ileus improved, walking.  Never got NG yestreday.     Physical Exam: BP 120/69 (BP Location: Left Arm)   Pulse 92   Temp 97.9 F (36.6 C) (Axillary)   Resp 18   Ht 5\' 10"  (1.778 m)   Wt 88.5 kg   SpO2 99%   BMI 27.98 kg/m   Male, interactive and appropriate but tired, uncomfortable, lying in bed Heart rate regular but tachycardic, no JVD, no peripheral edema Respiratory normal, lungs clear, no rales or wheezes Abdomen somewhat distended, guarding, tender throughout Attention normal, affect guarded, judgment and insight appear normal, upper extremity strength appears normal and symmetric    Data Reviewed: Cr normal, mild hyponatermia CBC with mild anemia only  Family Communication: Btoehr and sister at bedside    Disposition: Status is: Inpatient         Author: Alberteen Sam, MD 12/06/2023 6:01 PM  For on call review [email or web address].

## 2023-12-06 NOTE — Evaluation (Signed)
 Physical Therapy Evaluation Patient Details Name: Maria Gallicchio MRN: [id number] DOB: 1971/02/09 Today's Date: 12/06/2023  History of Present Illness  Pt is a 53 y.o. male presenting with vomiting, Chronic pelvic abscess due to rectal stump disruption. PMH of diabetes, VTE, sleep apnea, and stage IIa adenocarcinoma status post neoadjuvant concurrent chemoradiation 04/03/2021 - 05/10/2021 followed by definitive resection on 07/17/2021, B PE, and PVD .   Clinical Impression  Pt is a 53 y.o. male with above HPI resulting in the deficits listed below (see PT Problem List). Pt is typically independent at baseline without use of AD and lives with wife and 3 daughters, one of whom pt is caregiver for. Pt performed sit to stand transfers with CGA for safety. Pt ambulated total of ~125ft with RW and CGA.  x1 standing rest break and periods of supervision level- pt limited by pain. Pt will benefit from skilled PT to maximize functional mobility to increase independence. Recommend continuation of skilled PT services upon d/c with HHPT and intermittent assist from family.          If plan is discharge home, recommend the following: A little help with walking and/or transfers;A little help with bathing/dressing/bathroom;Help with stairs or ramp for entrance;Assist for transportation;Assistance with cooking/housework   Can travel by private vehicle        Equipment Recommendations None recommended by PT (pt owns RW)  Recommendations for Other Services       Functional Status Assessment Patient has had a recent decline in their functional status and demonstrates the ability to make significant improvements in function in a reasonable and predictable amount of time.     Precautions / Restrictions Precautions Precautions: Fall Precaution/Restrictions Comments: colostomy (h/o rectal cancer) Restrictions Weight Bearing Restrictions Per Provider Order: No      Mobility  Bed Mobility Overal bed mobility:  Modified Independent             General bed mobility comments: pt in recliner upon entry, MOD I with sit to supine at end of session    Transfers Overall transfer level: Needs assistance Equipment used: Rolling walker (2 wheels) Transfers: Sit to/from Stand Sit to Stand: Contact guard assist           General transfer comment: Pt requesting use of RW due to increased pain levels today. Pt reported when getting oOB to chair with nursing he did have mild L knee buckle, but was able to "catch" himself and prevent fall.    Ambulation/Gait Ambulation/Gait assistance: Contact guard assist Gait Distance (Feet): 100 Feet Assistive device: Rolling walker (2 wheels) Gait Pattern/deviations: Step-through pattern, Decreased stride length, Antalgic, Trunk flexed Gait velocity: decr     General Gait Details: periods of supervision, no overt LOB observed. X1 standing rest break during distance.  Stairs            Wheelchair Mobility     Tilt Bed    Modified Rankin (Stroke Patients Only)       Balance Overall balance assessment: Mild deficits observed, not formally tested                                           Pertinent Vitals/Pain Pain Assessment Pain Assessment: Faces Faces Pain Scale: Hurts even more Pain Location: B lower ribs and pelvic region Pain Descriptors / Indicators: Aching, Discomfort, Sore Pain Intervention(s): Limited activity within patient's tolerance, Monitored during  session, Repositioned    Home Living Family/patient expects to be discharged to:: Private residence Living Arrangements: Spouse/significant other;Children (has 3 daughters) Available Help at Discharge: Family;Neighbor Type of Home: House Home Access: Stairs to enter Entrance Stairs-Rails: Can reach both Entrance Stairs-Number of Steps: 3 Alternate Level Stairs-Number of Steps: 10-12 Home Layout: Two level;Able to live on main level with  bedroom/bathroom Home Equipment: Rolling Walker (2 wheels);Wheelchair - manual Additional Comments: 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 States that he can work from downstairs, but it would be inconvenient. Has an 53 y.o. daugter that him and family are caregiver for. Has 2 dogs.    Prior Function Prior Level of Function : Independent/Modified Independent             Mobility Comments: no AD use at baseline       Extremity/Trunk Assessment   Upper Extremity Assessment Upper Extremity Assessment: Overall WFL for tasks assessed    Lower Extremity Assessment Lower Extremity Assessment: LLE deficits/detail;RLE deficits/detail RLE Deficits / Details: generalized weakness RLE Sensation: history of peripheral neuropathy LLE Deficits / Details: generalized weakness. Noted mild swelling L knee LLE Sensation: history of peripheral neuropathy    Cervical / Trunk Assessment Cervical / Trunk Assessment: Normal  Communication   Communication Communication: No apparent difficulties    Cognition Arousal: Alert Behavior During Therapy: WFL for tasks assessed/performed   PT - Cognitive impairments: No apparent impairments                         Following commands: Intact       Cueing       General Comments      Exercises     Assessment/Plan    PT Assessment Patient needs continued PT services  PT Problem List Decreased strength;Decreased activity tolerance;Decreased balance;Decreased mobility;Pain;Impaired sensation       PT Treatment Interventions DME instruction;Gait training;Stair training;Functional mobility training;Therapeutic activities;Therapeutic exercise;Balance training;Patient/family education    PT Goals (Current goals can be found in the Care Plan section)  Acute Rehab PT Goals Patient Stated Goal: Have less pain and be able to go back home and get to PLOF PT Goal Formulation: With patient Time For Goal Achievement: 12/20/23 Potential to Achieve  Goals: Good    Frequency Min 3X/week     Co-evaluation               AM-PAC PT "6 Clicks" Mobility  Outcome Measure Help needed turning from your back to your side while in a flat bed without using bedrails?: A Little Help needed moving from lying on your back to sitting on the side of a flat bed without using bedrails?: A Little Help needed moving to and from a bed to a chair (including a wheelchair)?: A Little Help needed standing up from a chair using your arms (e.g., wheelchair or bedside chair)?: A Little Help needed to walk in hospital room?: A Little Help needed climbing 3-5 steps with a railing? : A Lot 6 Click Score: 17    End of Session Equipment Utilized During Treatment: Gait belt Activity Tolerance: Patient limited by pain Patient left: in bed;with call bell/phone within reach;with family/visitor present Nurse Communication: Mobility status PT Visit Diagnosis: Pain;Difficulty in walking, not elsewhere classified (R26.2) Pain - Right/Left:  (bilateral) Pain - part of body:  (pelvic region and rib area)    Time: 4098-1191 PT Time Calculation (min) (ACUTE ONLY): 25 min   Charges:   PT Evaluation $PT Eval Low  Complexity: 1 Low PT Treatments $Therapeutic Activity: 8-22 mins PT General Charges $$ ACUTE PT VISIT: 1 Visit         Lyman Speller PT, DPT  Acute Rehabilitation Services  Office [phone]  12/06/2023, 3:24 PM

## 2023-12-07 DIAGNOSIS — K651 Peritoneal abscess: Secondary | ICD-10-CM | POA: Diagnosis not present

## 2023-12-07 LAB — CBC
HCT: 33 % — ABNORMAL LOW (ref 39.0–52.0)
Hemoglobin: 9.6 g/dL — ABNORMAL LOW (ref 13.0–17.0)
MCH: 20.9 pg — ABNORMAL LOW (ref 26.0–34.0)
MCHC: 29.1 g/dL — ABNORMAL LOW (ref 30.0–36.0)
MCV: 71.7 fL — ABNORMAL LOW (ref 80.0–100.0)
Platelets: 324 10*3/uL (ref 150–400)
RBC: 4.6 MIL/uL (ref 4.22–5.81)
RDW: 18.7 % — ABNORMAL HIGH (ref 11.5–15.5)
WBC: 6.4 10*3/uL (ref 4.0–10.5)
nRBC: 0 % (ref 0.0–0.2)

## 2023-12-07 LAB — BASIC METABOLIC PANEL
Anion gap: 10 (ref 5–15)
BUN: 24 mg/dL — ABNORMAL HIGH (ref 6–20)
CO2: 19 mmol/L — ABNORMAL LOW (ref 22–32)
Calcium: 7.6 mg/dL — ABNORMAL LOW (ref 8.9–10.3)
Chloride: 104 mmol/L (ref 98–111)
Creatinine, Ser: 0.82 mg/dL (ref 0.61–1.24)
GFR, Estimated: >60 mL/min (ref >60)
Glucose, Bld: 186 mg/dL — ABNORMAL HIGH (ref 70–99)
Potassium: 3.4 mmol/L — ABNORMAL LOW (ref 3.5–5.1)
Sodium: 133 mmol/L — ABNORMAL LOW (ref 135–145)

## 2023-12-07 LAB — GLUCOSE, CAPILLARY
Glucose-Capillary: 167 mg/dL — ABNORMAL HIGH (ref 70–99)
Glucose-Capillary: 245 mg/dL — ABNORMAL HIGH (ref 70–99)
Glucose-Capillary: 182 mg/dL — ABNORMAL HIGH (ref 70–99)
Glucose-Capillary: 169 mg/dL — ABNORMAL HIGH (ref 70–99)

## 2023-12-07 MED ORDER — NAPROXEN 250 MG PO TABS
550.0000 mg | ORAL_TABLET | Freq: Two times a day (BID) | ORAL | Status: DC | PRN
Start: 2023-12-07 — End: 2023-12-09
  Administered 2023-12-07 – 2023-12-09 (×3): 500 mg via ORAL
  Filled 2023-12-07 (×3): qty 2

## 2023-12-07 MED ORDER — IBUPROFEN 200 MG PO TABS
600.0000 mg | ORAL_TABLET | Freq: Four times a day (QID) | ORAL | Status: DC | PRN
  Administered 2023-12-07: 600 mg via ORAL
  Filled 2023-12-07: qty 3

## 2023-12-07 MED ORDER — FAMOTIDINE 20 MG PO TABS
20.0000 mg | ORAL_TABLET | Freq: Two times a day (BID) | ORAL | Status: DC | PRN
  Administered 2023-12-07 – 2023-12-08 (×2): 20 mg via ORAL
  Filled 2023-12-07 (×2): qty 1

## 2023-12-07 NOTE — Plan of Care (Signed)

## 2023-12-07 NOTE — TOC Progression Note (Signed)
 Transition of Care [facility]) - Progression Note    Patient Details  Name: Steven [name] MRN: [id number] Date of Birth: 1971-09-17  Transition of Care [facility]) CM/SW Contact  Diona Browner, LCSW Phone Number: 12/07/2023, 2:23 PM  Clinical Narrative:    [facility] set up for HHPT.      Barriers to Discharge: No Barriers Identified, Continued Medical Work up  Expected Discharge Plan and Services                                               Social Determinants of Health (SDOH) Interventions SDOH Screenings   Food Insecurity: No Food Insecurity (11/30/2023)  Housing: Low Risk  (11/30/2023)  Transportation Needs: No Transportation Needs (11/30/2023)  Utilities: Not At Risk (11/30/2023)  Social Connections: Socially Integrated (11/30/2023)  Tobacco Use: Low Risk  (12/02/2023)    Readmission Risk Interventions    12/07/2023    2:23 PM  Readmission Risk Prevention Plan  Transportation Screening Complete  PCP or Specialist Appt within 5-7 Days Complete  Home Care Screening Complete  Medication Review (RN CM) Complete

## 2023-12-07 NOTE — Progress Notes (Signed)
 5 Days Post-Op   Subjective/Chief Complaint: Having a bit of gas pain but feels like things are moving through.  Ostomy continues to be productive.  Tolerating p.o.   Objective: Vital signs in last 24 hours: Temp:  [97.9 F (36.6 C)-98.2 F (36.8 C)] 98.1 F (36.7 C) (03/10 0851) Pulse Rate:  [92-100] 100 (03/10 0851) Resp:  [18-20] 20 (03/10 0851) BP: (115-138)/(66-74) 138/66 (03/10 0851) SpO2:  [94 %-99 %] 94 % (03/10 0851) FiO2 (%):  [21 %] 21 % (03/09 2153) Last BM Date : 12/06/23  Intake/Output from previous day: 03/09 0701 - 03/10 0700 In: 1512.5 [I.V.:1512.5] Out: 2850 [Urine:1700; Stool:1150] Intake/Output this shift: No intake/output data recorded.  General appearance: alert and cooperative Resp: clear to auscultation bilaterally Cardio: regular rate and rhythm GI: soft, mild tenderness. Ostomy pink, slightly prolapsed and productive  Lab Results:  Recent Labs    12/06/23 0504 12/07/23 0418  WBC 6.4 6.4  HGB 10.1* 9.6*  HCT 35.4* 33.0*  PLT 332 324   BMET Recent Labs    12/06/23 0504 12/07/23 0418  NA 134* 133*  K 3.7 3.4*  CL 101 104  CO2 19* 19*  GLUCOSE 214* 186*  BUN 32* 24*  CREATININE 1.03 0.82  CALCIUM 8.0* 7.6*   PT/INR No results for input(s): "LABPROT", "INR" in the last 72 hours. ABG No results for input(s): "PHART", "HCO3" in the last 72 hours.  Invalid input(s): "PCO2", "PO2"  Studies/Results: No results found.   Anti-infectives: Anti-infectives (From admission, onward)    Start     Dose/Rate Route Frequency Ordered Stop   11/29/23 2200  piperacillin-tazobactam (ZOSYN) IVPB 3.375 g        3.375 g 12.5 mL/hr over 240 Minutes Intravenous Every 8 hours 11/29/23 2147 12/04/23 1810   11/29/23 2130  metroNIDAZOLE (FLAGYL) IVPB 500 mg  Status:  Discontinued        500 mg 100 mL/hr over 60 Minutes Intravenous  Once 11/29/23 2119 11/29/23 2147   11/29/23 2130  ceFEPIme (MAXIPIME) 2 g in sodium chloride 0.9 % 100 mL IVPB  Status:   Discontinued        2 g 200 mL/hr over 30 Minutes Intravenous Every 24 hours 11/29/23 2126 11/29/23 2147       Assessment/Plan: s/p Procedure(s) with comments: Perineal Proctatectomy (N/A) - Completion Proctatectomy Advance diet as tolerated Wound care-will try to coordinate with nursing so we can check his perineal wound with the afternoon dressing change Ambulate PT Ileus seems to be resolving POD 5  LOS: 8 days    Berna Bue 12/07/2023

## 2023-12-07 NOTE — Progress Notes (Signed)
 RT offered pt CPAP. Pt states he will call when he is ready to go on.

## 2023-12-07 NOTE — Progress Notes (Signed)
  Progress Note   Patient: Steven [name] EAV:[id number] DOB: [birth year] DOA: 11/29/2023     8 DOS: the patient was seen and examined on 12/07/2023 at 11:10AM      Brief hospital course: History colon cancer status post LAR complicated by chronic abscess, presented with acute on chronic abscess, now status post washout     Assessment and Plan: *Abscess Status post abscess drainage in the OR Ileus Completed 5 days of Zosyn, white count normalized, no fever.  Ileus seems to have resolved, ostomy output is good, tolerating diet without vomiting.     AKI (acute kidney injury) (HCC) AKI resolved.  Off fluids and tolerating p.o.  History of pulmonary embolus (PE) -Continue Xarelto  Colostomy in place [facility]) - Consult WOC  Uncontrolled diabetes mellitus with hyperglycemia (HCC) Close normalized - Continue sliding scale corrections - Hold home Farxiga  OSA (obstructive sleep apnea) -CPAP at night  Anxiety - Continue home Xanax PRN  Asthma No evidence of flare - Continue PRN home albuterol          Subjective: Patient still having chronic pain, still on liquid diet.  However he is hopefully improving, started to have normal bowel function, decreased ileus, and improved pain and be able to discharge, later in the week     Physical Exam: BP 128/76 (BP Location: Left Arm)   Pulse 91   Temp 98.2 F (36.8 C) (Oral)   Resp 20   Ht 5\' 10"  (1.778 m)   Wt 88.5 kg   SpO2 97%   BMI 27.98 kg/m   Adult male, lying in bed, interactive and appropriate Still slightly tachycardic but better than yesterday, no murmurs, no peripheral edema Respiratory rate normal, lungs clear without rales or wheezes Abdomen distended, bowel sounds active, about 300 cc of watery brown liquid stool in the bag Attention normal, affect appropriate, judgment and insight appear normal    Data Reviewed: CBC shows mild hyponatremia, mild hypokalemia, mild hypocalcemia Renal function  normal Bicarb 19, anion gap normal CBC shows mild anemia, hemoglobin 9.6  Family Communication: Wife at the bedside    Disposition: Status is: Inpatient         Author: Alberteen Sam, MD 12/07/2023 5:33 PM  For on call review [email or web address].

## 2023-12-08 DIAGNOSIS — K651 Peritoneal abscess: Secondary | ICD-10-CM | POA: Diagnosis not present

## 2023-12-08 LAB — CBC
HCT: 32.4 % — ABNORMAL LOW (ref 39.0–52.0)
Hemoglobin: 9.5 g/dL — ABNORMAL LOW (ref 13.0–17.0)
MCH: 21.1 pg — ABNORMAL LOW (ref 26.0–34.0)
MCHC: 29.3 g/dL — ABNORMAL LOW (ref 30.0–36.0)
MCV: 71.8 fL — ABNORMAL LOW (ref 80.0–100.0)
Platelets: 347 10*3/uL (ref 150–400)
RBC: 4.51 MIL/uL (ref 4.22–5.81)
RDW: 18.7 % — ABNORMAL HIGH (ref 11.5–15.5)
WBC: 7 10*3/uL (ref 4.0–10.5)
nRBC: 0 % (ref 0.0–0.2)

## 2023-12-08 LAB — GLUCOSE, CAPILLARY
Glucose-Capillary: 173 mg/dL — ABNORMAL HIGH (ref 70–99)
Glucose-Capillary: 180 mg/dL — ABNORMAL HIGH (ref 70–99)
Glucose-Capillary: 207 mg/dL — ABNORMAL HIGH (ref 70–99)
Glucose-Capillary: 184 mg/dL — ABNORMAL HIGH (ref 70–99)

## 2023-12-08 LAB — BASIC METABOLIC PANEL
Anion gap: 8 (ref 5–15)
BUN: 18 mg/dL (ref 6–20)
CO2: 23 mmol/L (ref 22–32)
Calcium: 7.7 mg/dL — ABNORMAL LOW (ref 8.9–10.3)
Chloride: 103 mmol/L (ref 98–111)
Creatinine, Ser: 0.77 mg/dL (ref 0.61–1.24)
GFR, Estimated: >60 mL/min (ref >60)
Glucose, Bld: 205 mg/dL — ABNORMAL HIGH (ref 70–99)
Potassium: 3.7 mmol/L (ref 3.5–5.1)
Sodium: 134 mmol/L — ABNORMAL LOW (ref 135–145)

## 2023-12-08 MED ORDER — METHOCARBAMOL 1000 MG/10ML IJ SOLN: 1000.0000 mg | Freq: Four times a day (QID) | INTRAMUSCULAR | Status: DC | PRN

## 2023-12-08 MED ORDER — HYDROMORPHONE HCL 1 MG/ML IJ SOLN: 0.5000 mg | INTRAMUSCULAR | Status: DC | PRN

## 2023-12-08 MED ORDER — OXYCODONE HCL 5 MG PO TABS: 5.0000 mg | ORAL_TABLET | Freq: Four times a day (QID) | ORAL | Status: DC | PRN

## 2023-12-08 NOTE — Progress Notes (Signed)
 PT Note  Patient Details Name: Steven [name] MRN: [id number] DOB: [birth year]   Cancelled Treatment:    Reason Eval/Treat Not Completed: Other (comment). Pt reported feeling fatigued. Pt stated dressing change and assist with ADLs scheduled today between 1500 and 1600 and would like to defer PT until after that time. Pt states has been able to get up OOB and amb to and from the bathroom today. PT to return if schedule allows and to continue to follow acutely.   Johnny Bridge, PT Acute Rehab   Jacqualyn Posey 12/08/2023, 2:03 PM

## 2023-12-08 NOTE — Progress Notes (Signed)
 6 Days Post-Op   Subjective/Chief Complaint: Feels significantly better today and wants to eat more.    Objective: Vital signs in last 24 hours: Temp:  [97.9 F (36.6 C)-98.2 F (36.8 C)] 98.2 F (36.8 C) (03/11 0451) Pulse Rate:  [88-102] 88 (03/11 0451) Resp:  [16-20] 16 (03/11 0451) BP: (117-138)/(61-78) 117/68 (03/11 0451) SpO2:  [94 %-98 %] 96 % (03/11 0451) Last BM Date : 12/07/23  Intake/Output from previous day: 03/10 0701 - 03/11 0700 In: -  Out: 325 [Stool:325] Intake/Output this shift: No intake/output data recorded.  General appearance: alert and cooperative Resp: clear to auscultation bilaterally Cardio: regular rate and rhythm GI: soft, minimal tenderness, minimal distention. Ostomy pink, slightly prolapsed and productive  Lab Results:  Recent Labs    12/07/23 0418 12/08/23 0436  WBC 6.4 7.0  HGB 9.6* 9.5*  HCT 33.0* 32.4*  PLT 324 347   BMET Recent Labs    12/07/23 0418 12/08/23 0436  NA 133* 134*  K 3.4* 3.7  CL 104 103  CO2 19* 23  GLUCOSE 186* 205*  BUN 24* 18  CREATININE 0.82 0.77  CALCIUM 7.6* 7.7*   PT/INR No results for input(s): "LABPROT", "INR" in the last 72 hours. ABG No results for input(s): "PHART", "HCO3" in the last 72 hours.  Invalid input(s): "PCO2", "PO2"  Studies/Results: No results found.   Anti-infectives: Anti-infectives (From admission, onward)    Start     Dose/Rate Route Frequency Ordered Stop   11/29/23 2200  piperacillin-tazobactam (ZOSYN) IVPB 3.375 g        3.375 g 12.5 mL/hr over 240 Minutes Intravenous Every 8 hours 11/29/23 2147 12/04/23 1810   11/29/23 2130  metroNIDAZOLE (FLAGYL) IVPB 500 mg  Status:  Discontinued        500 mg 100 mL/hr over 60 Minutes Intravenous  Once 11/29/23 2119 11/29/23 2147   11/29/23 2130  ceFEPIme (MAXIPIME) 2 g in sodium chloride 0.9 % 100 mL IVPB  Status:  Discontinued        2 g 200 mL/hr over 30 Minutes Intravenous Every 24 hours 11/29/23 2126 11/29/23 2147        Assessment/Plan: s/p Procedure(s) with comments: Perineal Proctatectomy (N/A) - Completion Proctatectomy Regular diet today Wound care-continue BID damp to dry dressings. See photo from yesterday. P Ambulate PT POD 6  Anticipate discharge in the next day or 2.   LOS: 9 days    Berna Bue 12/08/2023

## 2023-12-08 NOTE — Progress Notes (Signed)
 Physical Therapy Treatment Patient Details Name: Steven [name] MRN: [id number] DOB: 14-May-1971 Today's Date: 12/08/2023   History of Present Illness Pt is a 53 y.o. male presenting with vomiting, Chronic pelvic abscess due to rectal stump disruption. PMH of diabetes, VTE, sleep apnea, and stage IIa adenocarcinoma status post neoadjuvant concurrent chemoradiation 04/03/2021 - 05/10/2021 followed by definitive resection on 07/17/2021, B PE, and PVD .    PT Comments   Pt admitted with above diagnosis.  Pt currently with functional limitations due to the deficits listed below (see PT Problem List). Pt agreeable to therapy intervention this evening. Pt is mod I for bed mobility and transfer tasks, mod I for short amb bouts in room no AD no overt LOB, use of RW for prolonged distances for energy conservation and stability 200 feet in hallway. Pt left seated in recliner all needs in place. Pt reports anticipates d/c home tomorrow, [name] services remain appropriate. Pt will benefit from acute skilled PT to increase their independence and safety with mobility to allow discharge.      If plan is discharge home, recommend the following: A little help with walking and/or transfers;A little help with bathing/dressing/bathroom;Help with stairs or ramp for entrance;Assist for transportation;Assistance with cooking/housework   Can travel by private vehicle        Equipment Recommendations  None recommended by PT (pt owns RW)    Recommendations for Other Services       Precautions / Restrictions Precautions Precautions: Fall Precaution/Restrictions Comments: colostomy (h/o rectal cancer) Restrictions Weight Bearing Restrictions Per Provider Order: No     Mobility  Bed Mobility Overal bed mobility: Modified Independent                  Transfers Overall transfer level: Modified independent                      Ambulation/Gait Ambulation/Gait assistance: Supervision Gait Distance  (Feet): 200 Feet Assistive device: Rolling walker (2 wheels) Gait Pattern/deviations: Step-through pattern, Trunk flexed Gait velocity: decreased     General Gait Details: slight trunk flexion and min cues for safety and proper use of AD, pt reports amb short distances in room no AD bed <> bathroom and able to demonstrate with PT and no overt LOB with pt declining RW at time of eval even for energy conservation   Stairs             Wheelchair Mobility     Tilt Bed    Modified Rankin (Stroke Patients Only)       Balance Overall balance assessment: Mild deficits observed, not formally tested, No apparent balance deficits (not formally assessed)                                          Communication Communication Communication: No apparent difficulties  Cognition Arousal: Alert Behavior During Therapy: WFL for tasks assessed/performed   PT - Cognitive impairments: No apparent impairments                         Following commands: Intact      Cueing    Exercises      General Comments        Pertinent Vitals/Pain Pain Assessment Pain Assessment: Faces Faces Pain Scale: Hurts little more Pain Location: peri area Pain Descriptors / Indicators: Aching, Discomfort, Sore,  Constant Pain Intervention(s): Limited activity within patient's tolerance, Monitored during session, Premedicated before session, Repositioned    Home Living                          Prior Function            PT Goals (current goals can now be found in the care plan section) Acute Rehab PT Goals Patient Stated Goal: Have less pain and be able to go back home and get to PLOF PT Goal Formulation: With patient Time For Goal Achievement: 12/20/23 Potential to Achieve Goals: Good Progress towards PT goals: Progressing toward goals    Frequency    Min 3X/week      PT Plan      Co-evaluation              AM-PAC PT "6 Clicks" Mobility    Outcome Measure  Help needed turning from your back to your side while in a flat bed without using bedrails?: None Help needed moving from lying on your back to sitting on the side of a flat bed without using bedrails?: None Help needed moving to and from a bed to a chair (including a wheelchair)?: None Help needed standing up from a chair using your arms (e.g., wheelchair or bedside chair)?: None Help needed to walk in hospital room?: A Little Help needed climbing 3-5 steps with a railing? : A Lot 6 Click Score: 21    End of Session Equipment Utilized During Treatment: Gait belt Activity Tolerance: Patient limited by fatigue Patient left: with call bell/phone within reach;in chair Nurse Communication: Mobility status PT Visit Diagnosis: Pain;Difficulty in walking, not elsewhere classified (R26.2) Pain - Right/Left:  (bilateral) Pain - part of body:  (pelvic region, peri area  and rib area)     Time: 1610-9604 PT Time Calculation (min) (ACUTE ONLY): 16 min  Charges:    $Gait Training: 8-22 mins PT General Charges $$ ACUTE PT VISIT: 1 Visit                     Steven [name], PT Acute Rehab    Steven [name] 12/08/2023, 7:10 PM

## 2023-12-08 NOTE — Plan of Care (Signed)

## 2023-12-08 NOTE — Progress Notes (Signed)
   12/08/23 2233  BiPAP/CPAP/SIPAP  BiPAP/CPAP/SIPAP Pt Type Adult (Patient prefers self placement when ready for bed.)  BiPAP/CPAP/SIPAP Resmed  Mask Type Full face mask  Dentures removed? Not applicable  Mask Size Large  FiO2 (%) 21 %  Patient Home Equipment No  Auto Titrate Yes (V-auto:  4-18 cm H2O)  CPAP/SIPAP surface wiped down Yes

## 2023-12-08 NOTE — Progress Notes (Signed)
  Progress Note   Patient: Steven [name] ZOX:[id number] DOB: 09-08-1971 DOA: 11/29/2023     9 DOS: the patient was seen and examined on 12/08/2023        Brief hospital course: 53 y.o. M with hx rectal CA s/p LAR in 2022 w/ diverting ileostomy c/b post-op hematoma that eventually got infected and developed a chronic fluid collection in the pelvis, also hx PE on Xarelto who presented with increased pelvic pain.  CT showed inflamed small bowel, air in the presacral fluid collection.  General Surgery were consulted and he was admitted on fluids and antibiotics.     Assessment and Plan: * Chronic pelvic abscess due to rectal stump disruption Status post abscess drainage in the OR He has a complex history of pelvic fluid collection after his colectomy in 2022.   Here, he was admitted on antibiotics, Surgery consulted who recommended drainage.  Post-op complicated by few days ileus, which seems improving.  Output from ostomy good today.  Pain better controlled  Completed 5 days of Zosyn, white count normalized, no fever. Ileus seems to have resolved, ostomy output is good  Surgery will advance diet today, if okay still tomorrow, pain controlled, likely home - Post-op care per Surgery    AKI (acute kidney injury) (HCC) Cr up to 2.0 post-op.  Resolved with fluids. - Trend Cr  History of pulmonary embolus (PE) - Continue Xarelto  Colostomy in place [facility]) - Consult WOC  Uncontrolled diabetes mellitus with hyperglycemia (HCC) Glucoes normal - Continue sliding scale corrections - Hold home Farxiga  OSA (obstructive sleep apnea) - CPAP at night  Anxiety - Continue home Xanax PRN  Asthma No evidence of flare - Continue PRN home albuterol          Subjective: Doing better.  No vomiting.     Physical Exam: BP 115/65 (BP Location: Right Arm)   Pulse 93   Temp 98.2 F (36.8 C) (Oral)   Resp 17   Ht 5\' 10"  (1.778 m)   Wt 88.5 kg   SpO2 99%   BMI 27.98 kg/m    Adult male, lying in bed, interactive and appropriate Still slightly tachycardic but better than yesterday, no murmurs, no peripheral edema Respiratory rate normal, lungs clear without rales or wheezes Abdomen distended, bowel sounds active, about 300 cc of watery brown liquid stool in the bag Attention normal, affect appropriate, judgment and insight appear normal  Data Reviewed: Discussed with surgery BMP unremarkable CBC without leukocytosis  Family Communication: Wife by phone    Disposition: Status is: Inpatient Doing well post op from abscess repair  Likely home with [facility] tomorrow if tolerating diet, labs okay and pain controlled        Author: Alberteen Sam, MD 12/08/2023 8:43 PM  For on call review [email or web address].

## 2023-12-09 DIAGNOSIS — K651 Peritoneal abscess: Secondary | ICD-10-CM | POA: Diagnosis not present

## 2023-12-09 LAB — CBC
HCT: 31.1 % — ABNORMAL LOW (ref 39.0–52.0)
Hemoglobin: 9.2 g/dL — ABNORMAL LOW (ref 13.0–17.0)
MCH: 20.8 pg — ABNORMAL LOW (ref 26.0–34.0)
MCHC: 29.6 g/dL — ABNORMAL LOW (ref 30.0–36.0)
MCV: 70.4 fL — ABNORMAL LOW (ref 80.0–100.0)
Platelets: 372 10*3/uL (ref 150–400)
RBC: 4.42 MIL/uL (ref 4.22–5.81)
RDW: 18.8 % — ABNORMAL HIGH (ref 11.5–15.5)
WBC: 8.3 10*3/uL (ref 4.0–10.5)
nRBC: 0 % (ref 0.0–0.2)

## 2023-12-09 LAB — BASIC METABOLIC PANEL
Anion gap: 10 (ref 5–15)
BUN: 16 mg/dL (ref 6–20)
CO2: 23 mmol/L (ref 22–32)
Calcium: 7.6 mg/dL — ABNORMAL LOW (ref 8.9–10.3)
Chloride: 101 mmol/L (ref 98–111)
Creatinine, Ser: 0.87 mg/dL (ref 0.61–1.24)
GFR, Estimated: >60 mL/min (ref >60)
Glucose, Bld: 204 mg/dL — ABNORMAL HIGH (ref 70–99)
Potassium: 2.8 mmol/L — ABNORMAL LOW (ref 3.5–5.1)
Sodium: 134 mmol/L — ABNORMAL LOW (ref 135–145)

## 2023-12-09 LAB — GLUCOSE, CAPILLARY
Glucose-Capillary: 177 mg/dL — ABNORMAL HIGH (ref 70–99)
Glucose-Capillary: 161 mg/dL — ABNORMAL HIGH (ref 70–99)

## 2023-12-09 MED ORDER — POTASSIUM CHLORIDE 20 MEQ PO PACK
40.0000 meq | PACK | Freq: Once | ORAL | Status: AC
  Administered 2023-12-09: 40 meq via ORAL
  Filled 2023-12-09: qty 2

## 2023-12-09 NOTE — TOC CM/SW Note (Signed)

## 2023-12-09 NOTE — TOC Progression Note (Signed)
 Transition of Care [facility]) - Progression Note    Patient Details  Name: Steven [name] MRN: [id number] Date of Birth: 09-10-1971  Transition of Care [facility]) CM/SW Contact  Diona Browner, LCSW Phone Number: 12/09/2023, 10:46 AM  Clinical Narrative:    [facility] for wound care added to [facility] services. [facility] notified and confirmed they are able to accommodate.     Barriers to Discharge: No Barriers Identified, Continued Medical Work up  Expected Discharge Plan and Services                                               Social Determinants of Health (SDOH) Interventions SDOH Screenings   Food Insecurity: No Food Insecurity (11/30/2023)  Housing: Low Risk  (11/30/2023)  Transportation Needs: No Transportation Needs (11/30/2023)  Utilities: Not At Risk (11/30/2023)  Social Connections: Socially Integrated (11/30/2023)  Tobacco Use: Low Risk  (12/02/2023)    Readmission Risk Interventions    12/07/2023    2:23 PM  Readmission Risk Prevention Plan  Transportation Screening Complete  PCP or Specialist Appt within 5-7 Days Complete  Home Care Screening Complete  Medication Review (RN CM) Complete

## 2023-12-09 NOTE — TOC Transition Note (Signed)
 Transition of Care [facility]) - Discharge Note   Patient Details  Name: Steven [name] MRN: [id number] Date of Birth: [birth year]  Transition of Care [facility]) CM/SW Contact:  Diona Browner, LCSW Phone Number: 12/09/2023, 2:27 PM   Clinical Narrative:    Pt medically ready to d/c home w/ HHPT/RN services through [facility]. No DME needing, pt has RW at home. No further TOC needs.    Final next level of care: Home w Home Health Services Barriers to Discharge: No Barriers Identified   Patient Goals and CMS Choice Patient states their goals for this hospitalization and ongoing recovery are:: return home CMS Medicare.gov Compare Post Acute Care list provided to:: Patient Choice offered to / list presented to : Patient [place] ownership interest in [facility].provided to:: Patient    Discharge Placement                       Discharge Plan and Services Additional resources added to the After Visit Summary for                  DME Arranged: N/A DME Agency: NA       HH Arranged: PT HH Agency: [facility] Date HH Agency Contacted: 12/09/23 Time HH Agency Contacted: 1200 Representative spoke with at [facility] Agency: Adele Dan  Social Drivers of Health (SDOH) Interventions SDOH Screenings   Food Insecurity: No Food Insecurity (11/30/2023)  Housing: Low Risk  (11/30/2023)  Transportation Needs: No Transportation Needs (11/30/2023)  Utilities: Not At Risk (11/30/2023)  Social Connections: Socially Integrated (11/30/2023)  Tobacco Use: Low Risk  (12/02/2023)     Readmission Risk Interventions    12/07/2023    2:23 PM  Readmission Risk Prevention Plan  Transportation Screening Complete  PCP or Specialist Appt within 5-7 Days Complete  Home Care Screening Complete  Medication Review (RN CM) Complete

## 2023-12-09 NOTE — Progress Notes (Signed)
 7 Days Post-Op   Subjective/Chief Complaint: States he had a rough night. Reports acid reflux, belching, and serous drainage from his wound. Having ostomy output. Denies nausea/vomiting. Reports his acid reflux is worse than normal. He does have acid reflux and usually takes tums and alka seltzer with relief.    Objective: Vital signs in last 24 hours: Temp:  [98 F (36.7 C)-98.4 F (36.9 C)] 98 F (36.7 C) (03/12 0443) Pulse Rate:  [87-93] 87 (03/12 0443) Resp:  [16-17] 16 (03/12 0443) BP: (115-125)/(65-71) 119/71 (03/12 0443) SpO2:  [96 %-99 %] 98 % (03/12 0443) FiO2 (%):  [21 %] 21 % (03/11 2233) Last BM Date : 12/08/23  Intake/Output from previous day: No intake/output data recorded. Intake/Output this shift: Total I/O In: 120 [P.O.:120] Out: -   General appearance: alert and cooperative Resp: clear to auscultation bilaterally Cardio: regular rate and rhythm GI: soft, minimal tenderness, some distention. Ostomy pink, slightly prolapsed and productive of gas a soft non-bloody stool, parastomal hernia a little firm, question some mild erythema of the skin superior and lateral to the stoma. patient reports it feels the same as usual to him.   Lab Results:  Recent Labs    12/08/23 0436 12/09/23 0449  WBC 7.0 8.3  HGB 9.5* 9.2*  HCT 32.4* 31.1*  PLT 347 372   BMET Recent Labs    12/08/23 0436 12/09/23 0449  NA 134* 134*  K 3.7 2.8*  CL 103 101  CO2 23 23  GLUCOSE 205* 204*  BUN 18 16  CREATININE 0.77 0.87  CALCIUM 7.7* 7.6*   PT/INR No results for input(s): "LABPROT", "INR" in the last 72 hours. ABG No results for input(s): "PHART", "HCO3" in the last 72 hours.  Invalid input(s): "PCO2", "PO2"  Studies/Results: No results found.   Anti-infectives: Anti-infectives (From admission, onward)    Start     Dose/Rate Route Frequency Ordered Stop   11/29/23 2200  piperacillin-tazobactam (ZOSYN) IVPB 3.375 g        3.375 g 12.5 mL/hr over 240 Minutes  Intravenous Every 8 hours 11/29/23 2147 12/04/23 1810   11/29/23 2130  metroNIDAZOLE (FLAGYL) IVPB 500 mg  Status:  Discontinued        500 mg 100 mL/hr over 60 Minutes Intravenous  Once 11/29/23 2119 11/29/23 2147   11/29/23 2130  ceFEPIme (MAXIPIME) 2 g in sodium chloride 0.9 % 100 mL IVPB  Status:  Discontinued        2 g 200 mL/hr over 30 Minutes Intravenous Every 24 hours 11/29/23 2126 11/29/23 2147       Assessment/Plan: s/p Procedure(s) with comments: Perineal Proctatectomy (N/A) - Completion Proctatectomy POD 7 AFVSS, WBC 8, hypokalemia 2.8 Regular diet  Wound care-continue BID damp to dry dressings. Wife performing wound care today around 12:30  Signs of resolving pSBO vs ileus. Parastomal hernia feels more full compared to my exam last week but this is likely due to known dilated loops of small bowel. He is clinically not obstructed - flatus/stool in ostomy bag. Will discuss with Dr. Fredricka Bonine who has examined him the last 2 days, if his abdominal exam is stable to improved then likely ok for him to discharge home this afternoon. HH RN arranged. Follow up with Dr. Maisie Fus arranged.     LOS: 10 days    Adam Phenix 12/09/2023

## 2023-12-09 NOTE — Discharge Instructions (Signed)
Advised to follow-up with primary care physician in 1 week. Advised to follow-up with general surgery as scheduled.

## 2023-12-09 NOTE — Discharge Summary (Signed)
 Physician Discharge Summary  Steven [name] WUJ:[id number] DOB: 21-Oct-1970 DOA: 11/29/2023  PCP: Noberto Retort, MD  Admit date: 11/29/2023  Discharge date: 12/09/2023  Admitted From: Home  Disposition:  Home Health  Recommendations for Outpatient Follow-up:  Follow up with PCP in 1-2 weeks. Please obtain BMP/CBC in one week Advised to follow-up with general surgery as scheduled.  Home Health: Home Health RN Equipment/Devices: None  Discharge Condition: Stable CODE STATUS:Full code Diet recommendation: Heart Healthy   Brief [facility] Course: This 53 y.o. M with hx rectal CA s/p LAR in 2022 w/ diverting ileostomy c/b post-op hematoma that eventually got infected and developed a chronic fluid collection in the pelvis, also hx PE on Xarelto who presented with increased pelvic pain. CT showed inflamed small bowel, air in the presacral fluid collection.  General Surgery were consulted and he was admitted for further  evaluation, He was started on fluids and antibiotics.  Patient completed the course of antibiotic during hospital course.  Patient underwent abscess fluid drainage, He tolerated procedure well.  Postoperative course complicated by ileus which subsequently resolved with bowel rest with IV hydration.  Patient started on liquid diet , advance to soft diet.  Tolerated well.  General surgery signed off.  Patient wants to be discharged Home, home health services arranged.   Discharge Diagnoses:  Principal Problem:   Chronic pelvic abscess due to rectal stump disruption Active Problems:   Asthma   Anxiety   OSA (obstructive sleep apnea)   Uncontrolled diabetes mellitus with hyperglycemia (HCC)   Obesity (BMI 30-39.9)   Gastroesophageal reflux disease   Adenocarcinoma of rectum (HCC)   Peripheral neuropathy due to chemotherapy (HCC)   Sensorineural hearing loss (SNHL) of both ears   Parastomal hernia without obstruction or gangrene   Colostomy in place [facility])   History  of pulmonary embolus (PE)   Chronic anticoagulation   AKI (acute kidney injury) (HCC)  Chronic pelvic abscess due to rectal stump disruption Status post abscess drainage in the OR He has a complex history of pelvic fluid collection after his colectomy in 2022.  Here, he was admitted on antibiotics, Surgery consulted who recommended drainage. Post-op complicated by few days ileus, which seems improving.  Output from ostomy good today.  Pain better controlled Completed 5 days of Zosyn, white count normalized, no fever. Ileus seems to have resolved, ostomy output is good Surgery advanced diet today, pain controlled, patient wants to be discharged. Completed course of antibiotics.  Patient being discharged home   AKI (acute kidney injury) (HCC) Cr up to 2.0 post-op.  Resolved with fluids. Trend Cr   History of pulmonary embolus (PE) Continue Xarelto   Colostomy in place [facility]) Consult WOC   Uncontrolled diabetes mellitus with hyperglycemia (HCC) Glucoes normal  Continue sliding scale corrections Hold home Farxiga   OSA (obstructive sleep apnea) CPAP at night   Anxiety Continue home Xanax PRN   Asthma No evidence of flare Continue PRN home albuterol      Discharge Instructions  Discharge Instructions     Call MD for:  difficulty breathing, headache or visual disturbances   Complete by: As directed    Call MD for:  persistant dizziness or light-headedness   Complete by: As directed    Call MD for:  persistant nausea and vomiting   Complete by: As directed    Diet - low sodium heart healthy   Complete by: As directed    Diet Carb Modified   Complete by: As directed  Discharge instructions   Complete by: As directed    Advised to follow-up with primary care physician in 1 week. Advised to follow-up with general surgery as scheduled.   Discharge wound care:   Complete by: As directed    Follow up wound care   Increase activity slowly   Complete by: As directed        Allergies as of 12/09/2023       Reactions   Contrast Media [iodinated Contrast Media] Hives, Nausea Only   As a 53 year old experienced hives and nausea        Medication List     STOP taking these medications    predniSONE 50 MG tablet Commonly known as: DELTASONE       TAKE these medications    acetaminophen 500 MG tablet Commonly known as: TYLENOL Take 1,000 mg by mouth every 6 (six) hours as needed for mild pain.   ALPRAZolam 0.5 MG tablet Commonly known as: XANAX Take 0.5 mg by mouth daily as needed for anxiety.   blood glucose meter kit and supplies Kit Dispense based on patient and insurance preference. Use up to four times daily as directed. (FOR ICD-9 250.00, 250.01).   calcium carbonate 500 MG chewable tablet Commonly known as: TUMS - dosed in mg elemental calcium Chew 2 tablets by mouth daily as needed for indigestion or heartburn.   fenofibrate 160 MG tablet Take 160 mg by mouth daily.   ibuprofen 200 MG tablet Commonly known as: ADVIL Take 600 mg by mouth every 6 (six) hours as needed for moderate pain.   IRON PO Take 1 tablet by mouth every evening.   naproxen sodium 220 MG tablet Commonly known as: ALEVE Take 220 mg by mouth daily as needed.   neomycin-bacitracin-polymyxin Oint Commonly known as: NEOSPORIN Apply 1 Application topically as needed for wound care.   ProAir HFA 108 (90 Base) MCG/ACT inhaler Generic drug: albuterol Inhale 2 puffs into the lungs every 4 (four) hours as needed for wheezing or shortness of breath.   rivaroxaban 20 MG Tabs tablet Commonly known as: XARELTO Take 1 tablet (20 mg total) by mouth daily with supper.   tetrahydrozoline-zinc 0.05-0.25 % ophthalmic solution Commonly known as: VISINE-AC Place 2 drops into both eyes 4 (four) times daily as needed (allergy).   traMADol 50 MG tablet Commonly known as: ULTRAM Take 1-2 tablets (50-100 mg total) by mouth every 6 (six) hours as needed for moderate  pain.   Xigduo XR 06-999 MG Tb24 Generic drug: Dapagliflozin Pro-metFORMIN ER Take 1 tablet by mouth daily.               Discharge Care Instructions  (From admission, onward)           Start     Ordered   12/09/23 0000  Discharge wound care:       Comments: Follow up wound care   12/09/23 1416            Follow-up Information     Romie Levee, MD. Go on 12/15/2023.   Specialties: General Surgery, Colon and Rectal Surgery Why: 9:30 AM, please arrive 30 min prior to appointment time. Contact information: [address] [place] [address] [phone]         [facility] Follow up.   Why: [facility] will be providing your physical therapy home health needs. Contact information: [address] [place] [address] [phone]  Noberto Retort, MD Follow up in 1 week(s).   Specialty: Family Medicine Contact information: [phone] [address] [place] [address] [phone]                Allergies  Allergen Reactions   Contrast Media [Iodinated Contrast Media] Hives and Nausea Only    As a 53 year old experienced hives and nausea    Consultations: General surgery   Procedures/Studies: DG Abd 1 View Result Date: 12/05/2023 CLINICAL DATA:  [id number] Post-op pain [id number] EXAM: ABDOMEN - 1 VIEW COMPARISON:  December 03, 2023 FINDINGS: Revisualization of diffuse gaseous dilation of loops of small bowel. Extent of dilation appears increased since most recent prior. No air is visualized in the rectum; mottled lucency overlying the area of the anus likely reflecting known abscess. Dilated loop of small bowel measures approximately 4.5 cm in diameter. Increased gaseous distension of the stomach. IMPRESSION: Increased gaseous dilation of loops of small bowel. Findings are concerning for small bowel obstruction. Electronically Signed   By: Meda Klinefelter M.D.   On:  12/05/2023 12:28   DG Abd Portable 1V Result Date: 12/03/2023 CLINICAL DATA:  Postoperative abdominal pain. EXAM: PORTABLE ABDOMEN - 1 VIEW COMPARISON:  None Available. FINDINGS: Mildly dilated small bowel loops are noted without definite colonic dilatation. IMPRESSION: Mildly dilated small bowel loops are noted most likely representing postoperative ileus, although distal small bowel obstruction cannot be excluded. Electronically Signed   By: Lupita Raider M.D.   On: 12/03/2023 12:11   CT ABDOMEN PELVIS WO CONTRAST Result Date: 12/01/2023 CLINICAL DATA:  Postop assess for obstruction or perforation EXAM: CT ABDOMEN AND PELVIS WITHOUT CONTRAST TECHNIQUE: Multidetector CT imaging of the abdomen and pelvis was performed following the standard protocol without IV contrast. RADIATION DOSE REDUCTION: This exam was performed according to the departmental dose-optimization program which includes automated exposure control, adjustment of the mA and/or kV according to patient size and/or use of iterative reconstruction technique. COMPARISON:  CT 11/29/2023, 03/09/2023, 01/12/2023 and exams dating back to 11/23/2020 FINDINGS: Lower chest: Lung bases demonstrate dependent atelectasis. Normal cardiac size. Hepatobiliary: Hyperdense sludge versus vicarious contrast in the gallbladder. No biliary dilatation. Pancreas: Unremarkable. No pancreatic ductal dilatation or surrounding inflammatory changes. Spleen: Normal in size without focal abnormality. Adrenals/Urinary Tract: Adrenal glands are normal. Kidneys show no hydronephrosis. Small nonobstructing left kidney stone. Hyperdense material in the bladder probably dilute contrast. Diffuse bladder wall thickening. Stomach/Bowel: Stomach is nonenlarged. No dilated small bowel. Enteral contrast within the colon and distal small bowel. Status post low anterior resection with right abdominal colostomy containing enteral contrast. Parastomal hernia containing fat and small bowel.  No obstruction. Contrast containing thickened small bowel in the low pelvis, inseparable from chronic presacral soft tissue abnormality. Extraluminal gas collection adjacent to the small bowel on series 2, image 79, tracks superiorly and is contiguous with gas and fluid collection that has increased compared with the exam yesterday. This collection measures 7.7 x 3.6 cm on series 2, image 68. Additional gas and fluid collection tracking into the left pelvis on series 2, image 78. Chronic gas and fluid collection within the presacral soft tissue thickening which is better delineated with intravenous contrast. Vascular/Lymphatic: Nonaneurysmal aorta.  No enlarging lymph node. Reproductive: State negative prostate Other: Multiple locules of free intraperitoneal gas as seen on the recent CT. Stranding and fluid within the pelvic mesentery. Musculoskeletal: Mild erosive and sclerotic changes along the anterior sacrum which are unchanged. IMPRESSION: 1. Since the examination performed  yesterday, increased extraluminal gas and fluid collection within the central pelvis, communicating with irregular gas collections tracking towards thickened small bowel in the low pelvis which cannot be separated from the chronic presacral soft tissue abnormality. Gas and fluid tracks to the left pelvis as well. Findings could be secondary to viscus perforation versus fistula. No obvious extravasated or extraluminal contrast in the pelvis. Elsewhere scattered foci of free intraperitoneal gas, grossly unchanged compared with CT performed yesterday 2. Right abdominal colostomy with parastomal hernia containing fat and small bowel but no obstructive features. Electronically Signed   By: Jasmine Pang M.D.   On: 12/01/2023 00:08   CT CHEST ABDOMEN PELVIS W CONTRAST Result Date: 11/29/2023 CLINICAL DATA:  Nausea and vomiting with lower abdominal pain, history of rectal carcinoma EXAM: CT CHEST, ABDOMEN, AND PELVIS WITH CONTRAST TECHNIQUE:  Multidetector CT imaging of the chest, abdomen and pelvis was performed following the standard protocol during bolus administration of intravenous contrast. RADIATION DOSE REDUCTION: This exam was performed according to the departmental dose-optimization program which includes automated exposure control, adjustment of the mA and/or kV according to patient size and/or use of iterative reconstruction technique. CONTRAST:  OMNIPAQUE IOHEXOL 300 MG/ML SOLN. Premedication was given for known contrast allergy. COMPARISON:  03/09/2023 FINDINGS: CT CHEST FINDINGS Cardiovascular: Thoracic aorta is within normal limits. No cardiac enlargement is seen. Pulmonary artery as visualized is within normal limits. Mediastinum/Nodes: Thoracic inlet is unremarkable. No hilar or mediastinal adenopathy is noted. The esophagus as visualized is within normal limits. Lungs/Pleura: The lungs are well aerated bilaterally. No focal infiltrate or sizable effusion is seen. No parenchymal nodules are noted. Musculoskeletal: No acute bony abnormality is noted. CT ABDOMEN PELVIS FINDINGS Hepatobiliary: No focal liver abnormality is seen. No gallstones, gallbladder wall thickening, or biliary dilatation. Pancreas: Unremarkable. No pancreatic ductal dilatation or surrounding inflammatory changes. Spleen: Normal in size without focal abnormality. Adrenals/Urinary Tract: Adrenal glands are within normal limits. Kidneys demonstrate a normal enhancement pattern bilaterally. Tiny nonobstructing left upper pole renal stone is seen unchanged from the prior study. No obstructive changes are seen. The bladder is within normal limits. Stomach/Bowel: There are changes consistent with a right lower quadrant colostomy. The colon proximal to this is well visualized without inflammatory change. The appendix has been surgically removed. Stomach is within normal limits. Herniation of multiple small bowel loops adjacent to the colostomy are seen. Multiple  inflamed loops of small bowel are identified matted within the lower pelvis distal to the herniated bowel loops at the level of the colostomy. Considerable wall thickening is noted and there is inflammatory change and multiple extraluminal foci of air consistent with perforation. Considerable mesenteric inflammatory changes are noted as well. There is suggestion of an early extraluminal air fluid collection which measures approximately 4.2 x 3.1 cm. This is best seen on image number 106 of series 2. Postsurgical changes are noted with an ileal to ileal anastomosis which appears widely patent. Fecalized bowel content is noted in this region. This is distal to the matted inflamed loops of small bowel in the pelvis. The matted loops within the pelvis lie adjacent to considerable presacral soft tissue density similar to that seen on the prior exam as well as a an air-fluid collection in the presacral region. This air-fluid collection previously was noted to be bland fluid and the air within now suggests extension of the inflammatory change into this collection. Vascular/Lymphatic: No significant vascular findings are present. No enlarged abdominal or pelvic lymph nodes. Reproductive: Prostate is unremarkable.  Other: Scattered free air throughout the abdomen consistent with the known perforation and inflammatory change within the pelvis. Musculoskeletal: No acute or significant osseous findings. IMPRESSION: Significantly inflamed loops of distal jejunum/proximal ileum matted within the lower pelvis adjacent to a previously seen presacral fluid collection. Air is now noted in that presacral fluid collection consistent with extension of local infection and there is considerable extraluminal air and fluid identified adjacent to these loops consistent with developing abscess and perforation. Surgical consultation is recommended. Multiple herniated loops of small bowel adjacent to the end colostomy in the right lower  quadrant. These occur proximal to the areas of inflammation within the more distal small bowel. Tiny nonobstructing left renal stone. No acute abnormality in the chest. Electronically Signed   By: Alcide Clever M.D.   On: 11/29/2023 21:00     Subjective: Patient was seen and examined at bedside.  Overnight events noted.   Reports abdominal pain has resolved.  Patient feels better and wants to be discharged.  Discharge Exam: Vitals:   12/09/23 0443 12/09/23 1146  BP: 119/71 123/72  Pulse: 87 (!) 101  Resp: 16 18  Temp: 98 F (36.7 C) 97.7 F (36.5 C)  SpO2: 98% 100%   Vitals:   12/08/23 1329 12/08/23 2045 12/09/23 0443 12/09/23 1146  BP: 115/65 125/65 119/71 123/72  Pulse: 93 93 87 (!) 101  Resp: 17 17 16 18   Temp: 98.2 F (36.8 C) 98.4 F (36.9 C) 98 F (36.7 C) 97.7 F (36.5 C)  TempSrc: Oral Oral Oral Oral  SpO2: 99% 96% 98% 100%  Weight:      Height:        General: Pt is alert, awake, not in acute distress Cardiovascular: RRR, S1/S2 +, no rubs, no gallops Respiratory: CTA bilaterally, no wheezing, no rhonchi Abdominal: Soft, NT, ND, bowel sounds + Extremities: no edema, no cyanosis    The results of significant diagnostics from this hospitalization (including imaging, microbiology, ancillary and laboratory) are listed below for reference.     Microbiology: Recent Results (from the past 240 hours)  Blood culture (routine x 2)     Status: None   Collection Time: 11/29/23  9:17 PM   Specimen: BLOOD LEFT ARM  Result Value Ref Range Status   Specimen Description   Final    BLOOD LEFT ARM Performed at [facility], [address], [place], [place] [address]    Special Requests   Final    BOTTLES DRAWN AEROBIC AND ANAEROBIC Blood Culture results may not be optimal due to an inadequate volume of blood received in culture bottles Performed at [facility], [address] [place], [place] [address]    Culture   Final    NO GROWTH 5  DAYS Performed at [facility], [address], [place], [place] [address]    Report Status 12/05/2023 FINAL  Final  Blood culture (routine x 2)     Status: None   Collection Time: 11/29/23 10:02 PM   Specimen: BLOOD RIGHT FOREARM  Result Value Ref Range Status   Specimen Description   Final    BLOOD RIGHT FOREARM Performed at [facility], [address], [place], [place] [address]    Special Requests   Final    BOTTLES DRAWN AEROBIC AND ANAEROBIC Blood Culture results may not be optimal due to an inadequate volume of blood received in culture bottles Performed at [facility], [address] [place],  [place] [address]    Culture   Final    NO GROWTH 5 DAYS Performed at [facility], [address] [place], [place] [address]    Report Status 12/05/2023 FINAL  Final  Urine Culture (for pregnant, neutropenic or urologic patients or patients with an indwelling urinary catheter)     Status: Abnormal   Collection Time: 11/29/23 10:51 PM   Specimen: Urine, Clean Catch  Result Value Ref Range Status   Specimen Description   Final    URINE, CLEAN CATCH Performed at [facility], [address], [place], [place] [address]    Special Requests   Final    NONE Performed at [facility], [address], [place], [place] [address]    Culture >=100,000 COLONIES/mL ESCHERICHIA COLI (A)  Final   Report Status 12/02/2023 FINAL  Final   Organism ID, Bacteria ESCHERICHIA COLI (A)  Final      Susceptibility   Escherichia coli - MIC*    AMPICILLIN 4 SENSITIVE Sensitive     CEFAZOLIN <=4 SENSITIVE Sensitive     CEFEPIME <=0.12 SENSITIVE Sensitive     CEFTRIAXONE <=0.25 SENSITIVE Sensitive     CIPROFLOXACIN <=0.25 SENSITIVE Sensitive     GENTAMICIN <=1 SENSITIVE Sensitive     IMIPENEM <=0.25 SENSITIVE Sensitive     NITROFURANTOIN <=16 SENSITIVE Sensitive     TRIMETH/SULFA <=20 SENSITIVE Sensitive      AMPICILLIN/SULBACTAM <=2 SENSITIVE Sensitive     PIP/TAZO <=4 SENSITIVE Sensitive ug/mL    * >=100,000 COLONIES/mL ESCHERICHIA COLI  MRSA Next Gen by PCR, Nasal     Status: None   Collection Time: 11/29/23 11:00 PM   Specimen: Nasal Mucosa; Nasal Swab  Result Value Ref Range Status   MRSA by PCR Next Gen NOT DETECTED NOT DETECTED Final    Comment: (NOTE) The GeneXpert MRSA Assay (FDA approved for NASAL specimens only), is one component of a comprehensive MRSA colonization surveillance program. It is not intended to diagnose MRSA infection nor to guide or monitor treatment for MRSA infections. Test performance is not FDA approved in patients less than 21 years old. Performed at [facility], [address], [place], [place] [address]      Labs: BNP (last 3 results) No results for input(s): "BNP" in the last 8760 hours. Basic Metabolic Panel: Recent Labs  Lab 12/05/23 0516 12/06/23 0504 12/07/23 0418 12/08/23 0436 12/09/23 0449  NA 132* 134* 133* 134* 134*  K 4.1 3.7 3.4* 3.7 2.8*  CL 102 101 104 103 101  CO2 18* 19* 19* 23 23  GLUCOSE 237* 214* 186* 205* 204*  BUN 47* 32* 24* 18 16  CREATININE 1.56* 1.03 0.82 0.77 0.87  CALCIUM 8.3* 8.0* 7.6* 7.7* 7.6*   Liver Function Tests: Recent Labs  Lab 12/04/23 0409  AST 24  ALT 15  ALKPHOS 49  BILITOT 1.3*  PROT 6.4*  ALBUMIN 2.5*   No results for input(s): "LIPASE", "AMYLASE" in the last 168 hours. No results for input(s): "AMMONIA" in the last 168 hours. CBC: Recent Labs  Lab 12/05/23 0516 12/06/23 0504 12/07/23 0418 12/08/23 0436 12/09/23 0449  WBC 8.5 6.4 6.4 7.0 8.3  HGB 10.5* 10.1* 9.6* 9.5* 9.2*  HCT 36.1* 35.4* 33.0* 32.4* 31.1*  MCV 71.2* 72.7* 71.7* 71.8* 70.4*  PLT 332 332 324 347 372   Cardiac Enzymes: No results for input(s): "CKTOTAL", "CKMB", "CKMBINDEX", "TROPONINI" in the last 168 hours. BNP: Invalid input(s): "POCBNP" CBG: Recent Labs  Lab 12/08/23 1129 12/08/23 1622  12/08/23 2138 12/09/23 0749 12/09/23 1145  GLUCAP 180* 207* 184* 177* 161*   D-Dimer No results for input(s): "DDIMER" in the last 72 hours. Hgb A1c No results for input(s): "HGBA1C" in the last 72 hours. Lipid Profile No results for input(s): "CHOL", "HDL", "LDLCALC", "TRIG", "CHOLHDL", "LDLDIRECT" in the last 72 hours. Thyroid function studies No results for input(s): "TSH", "T4TOTAL", "T3FREE", "THYROIDAB" in the last 72 hours.  Invalid input(s): "FREET3" Anemia work up No results for input(s): "VITAMINB12", "FOLATE", "FERRITIN", "TIBC", "IRON", "RETICCTPCT" in the last 72 hours. Urinalysis    Component Value Date/Time   COLORURINE YELLOW 11/29/2023 2037   APPEARANCEUR HAZY (A) 11/29/2023 2037   LABSPEC 1.033 (H) 11/29/2023 2037   PHURINE 5.0 11/29/2023 2037   GLUCOSEU >=500 (A) 11/29/2023 2037   HGBUR SMALL (A) 11/29/2023 2037   BILIRUBINUR NEGATIVE 11/29/2023 2037   KETONESUR 5 (A) 11/29/2023 2037   PROTEINUR NEGATIVE 11/29/2023 2037   NITRITE NEGATIVE 11/29/2023 2037   LEUKOCYTESUR NEGATIVE 11/29/2023 2037   Sepsis Labs Recent Labs  Lab 12/06/23 0504 12/07/23 0418 12/08/23 0436 12/09/23 0449  WBC 6.4 6.4 7.0 8.3   Microbiology Recent Results (from the past 240 hours)  Blood culture (routine x 2)     Status: None   Collection Time: 11/29/23  9:17 PM   Specimen: BLOOD LEFT ARM  Result Value Ref Range Status   Specimen Description   Final    BLOOD LEFT ARM Performed at [facility], [address], [place], [place] [address]    Special Requests   Final    BOTTLES DRAWN AEROBIC AND ANAEROBIC Blood Culture results may not be optimal due to an inadequate volume of blood received in culture bottles Performed at [facility], [address], [place], [place] [address]    Culture   Final    NO GROWTH 5 DAYS Performed at [facility], [address] [place], [place] [address]    Report Status 12/05/2023 FINAL  Final  Blood  culture (routine x 2)     Status: None   Collection Time: 11/29/23 10:02 PM   Specimen: BLOOD RIGHT FOREARM  Result Value Ref Range Status   Specimen Description   Final    BLOOD RIGHT FOREARM Performed at [facility], [address], [place], [place] [address]    Special Requests   Final    BOTTLES DRAWN AEROBIC AND ANAEROBIC Blood Culture results may not be optimal due to an inadequate volume of blood received in culture bottles Performed at [facility], [address] [place], [place] [address]    Culture   Final    NO GROWTH 5 DAYS Performed at [facility], [address], [place], [place] [address]    Report Status 12/05/2023 FINAL  Final  Urine Culture (for pregnant, neutropenic or urologic patients or patients with an indwelling urinary catheter)     Status: Abnormal   Collection Time: 11/29/23 10:51 PM   Specimen: Urine, Clean Catch  Result Value Ref Range Status   Specimen Description   Final    URINE, CLEAN CATCH Performed at [facility], [address] [place], [place] [address]    Special Requests   Final    NONE Performed at [facility], [address], [place], [place] [address]    Culture >=100,000 COLONIES/mL ESCHERICHIA COLI (A)  Final   Report Status 12/02/2023 FINAL  Final   Organism ID, Bacteria ESCHERICHIA COLI (A)  Final      Susceptibility   Escherichia coli - MIC*    AMPICILLIN 4 SENSITIVE Sensitive     CEFAZOLIN <=4 SENSITIVE Sensitive     CEFEPIME <=0.12 SENSITIVE Sensitive     CEFTRIAXONE <=0.25 SENSITIVE Sensitive     CIPROFLOXACIN <=0.25 SENSITIVE Sensitive     GENTAMICIN <=1 SENSITIVE Sensitive     IMIPENEM <=0.25 SENSITIVE Sensitive     NITROFURANTOIN <=16 SENSITIVE Sensitive     TRIMETH/SULFA <=20 SENSITIVE Sensitive     AMPICILLIN/SULBACTAM <=2 SENSITIVE Sensitive     PIP/TAZO <=4 SENSITIVE Sensitive ug/mL    * >=100,000 COLONIES/mL ESCHERICHIA COLI  MRSA Next Gen  by PCR, Nasal     Status: None   Collection Time: 11/29/23 11:00 PM   Specimen: Nasal Mucosa; Nasal Swab  Result Value Ref Range Status   MRSA by PCR Next Gen NOT DETECTED NOT DETECTED Final    Comment: (NOTE) The GeneXpert MRSA Assay (FDA approved for NASAL specimens only), is one component of a comprehensive MRSA colonization surveillance program. It is not intended to diagnose MRSA infection nor to guide or monitor treatment for MRSA infections. Test performance is not FDA approved in patients less than 59 years old. Performed at [facility], [address], [place], [place] [address]      Time coordinating discharge: Over 30 minutes  SIGNED:   Willeen Niece, MD  [facility] 12/09/2023, 3:34 PM Pager   If 7PM-7AM, please contact night-coverage

## 2023-12-10 DIAGNOSIS — C20 Malignant neoplasm of rectum: Secondary | ICD-10-CM | POA: Diagnosis not present

## 2023-12-10 DIAGNOSIS — K9189 Other postprocedural complications and disorders of digestive system: Secondary | ICD-10-CM | POA: Diagnosis not present

## 2023-12-10 DIAGNOSIS — E1165 Type 2 diabetes mellitus with hyperglycemia: Secondary | ICD-10-CM | POA: Diagnosis not present

## 2023-12-10 DIAGNOSIS — K651 Peritoneal abscess: Secondary | ICD-10-CM | POA: Diagnosis not present

## 2023-12-15 ENCOUNTER — Encounter (HOSPITAL_COMMUNITY): Payer: Self-pay

## 2023-12-15 ENCOUNTER — Other Ambulatory Visit: Payer: Self-pay

## 2023-12-15 ENCOUNTER — Other Ambulatory Visit: Payer: Self-pay | Admitting: General Surgery

## 2023-12-15 ENCOUNTER — Inpatient Hospital Stay (HOSPITAL_COMMUNITY)
Admission: EM | Admit: 2023-12-15 | Discharge: 2023-12-17 | DRG: 909 | Disposition: A | Discharge status: Home / Self Care | Attending: General Surgery | Admitting: General Surgery

## 2023-12-15 DIAGNOSIS — T8189XA Other complications of procedures, not elsewhere classified, initial encounter: Secondary | ICD-10-CM | POA: Diagnosis not present

## 2023-12-15 DIAGNOSIS — Y831 Surgical operation with implant of artificial internal device as the cause of abnormal reaction of the patient, or of later complication, without mention of misadventure at the time of the procedure: Secondary | ICD-10-CM | POA: Diagnosis present

## 2023-12-15 DIAGNOSIS — T8131XA Disruption of external operation (surgical) wound, not elsewhere classified, initial encounter: Secondary | ICD-10-CM | POA: Diagnosis not present

## 2023-12-15 DIAGNOSIS — L03317 Cellulitis of buttock: Secondary | ICD-10-CM

## 2023-12-15 DIAGNOSIS — G4733 Obstructive sleep apnea (adult) (pediatric): Secondary | ICD-10-CM | POA: Diagnosis not present

## 2023-12-15 DIAGNOSIS — Z91041 Radiographic dye allergy status: Secondary | ICD-10-CM

## 2023-12-15 DIAGNOSIS — I9789 Other postprocedural complications and disorders of the circulatory system, not elsewhere classified: Secondary | ICD-10-CM | POA: Diagnosis present

## 2023-12-15 DIAGNOSIS — I96 Gangrene, not elsewhere classified: Secondary | ICD-10-CM | POA: Diagnosis not present

## 2023-12-15 DIAGNOSIS — L98429 Non-pressure chronic ulcer of back with unspecified severity: Secondary | ICD-10-CM | POA: Diagnosis not present

## 2023-12-15 DIAGNOSIS — L7682 Other postprocedural complications of skin and subcutaneous tissue: Secondary | ICD-10-CM | POA: Diagnosis not present

## 2023-12-15 DIAGNOSIS — I10 Essential (primary) hypertension: Secondary | ICD-10-CM | POA: Diagnosis not present

## 2023-12-15 DIAGNOSIS — J45909 Unspecified asthma, uncomplicated: Secondary | ICD-10-CM | POA: Diagnosis not present

## 2023-12-15 LAB — CREATININE, SERUM
Creatinine, Ser: 0.84 mg/dL (ref 0.61–1.24)
GFR, Estimated: >60 mL/min (ref >60)

## 2023-12-15 MED ORDER — CALCIUM CARBONATE ANTACID 500 MG PO CHEW: 2.0000 | CHEWABLE_TABLET | Freq: Every day | ORAL | Status: DC | PRN

## 2023-12-15 MED ORDER — IBUPROFEN 400 MG PO TABS: 600.0000 mg | ORAL_TABLET | Freq: Four times a day (QID) | ORAL | Status: DC | PRN

## 2023-12-15 MED ORDER — MORPHINE SULFATE (PF) 2 MG/ML IV SOLN: 2.0000 mg | INTRAVENOUS | Status: DC | PRN

## 2023-12-15 MED ORDER — ONDANSETRON HCL 4 MG/2ML IJ SOLN: 4.0000 mg | Freq: Four times a day (QID) | INTRAMUSCULAR | Status: DC | PRN

## 2023-12-15 MED ORDER — NAPHAZOLINE-GLYCERIN 0.012-0.25 % OP SOLN: 2.0000 [drp] | Freq: Four times a day (QID) | OPHTHALMIC | Status: DC | PRN

## 2023-12-15 MED ORDER — DAPAGLIFLOZIN PRO-METFORMIN ER 10-1000 MG PO TB24: 1.0000 | ORAL_TABLET | Freq: Every day | ORAL | Status: DC

## 2023-12-15 MED ORDER — FERROUS SULFATE 325 (65 FE) MG PO TABS
325.0000 mg | ORAL_TABLET | Freq: Every day | ORAL | Status: DC
Start: 2023-12-15 — End: 2023-12-17
  Administered 2023-12-15 – 2023-12-16 (×2): 325 mg via ORAL
  Filled 2023-12-15 (×2): qty 1

## 2023-12-15 MED ORDER — ALBUTEROL SULFATE (2.5 MG/3ML) 0.083% IN NEBU: 2.5000 mg | INHALATION_SOLUTION | RESPIRATORY_TRACT | Status: DC | PRN

## 2023-12-15 MED ORDER — HYDROCODONE-ACETAMINOPHEN 5-325 MG PO TABS
1.0000 | ORAL_TABLET | ORAL | Status: DC | PRN
  Administered 2023-12-15 – 2023-12-17 (×8): 2 via ORAL
  Filled 2023-12-15 (×8): qty 2

## 2023-12-15 MED ORDER — ENOXAPARIN SODIUM 40 MG/0.4ML IJ SOSY
40.0000 mg | PREFILLED_SYRINGE | INTRAMUSCULAR | Status: DC
Start: 2023-12-15 — End: 2023-12-15
  Administered 2023-12-15: 40 mg via SUBCUTANEOUS
  Filled 2023-12-15: qty 0.4

## 2023-12-15 MED ORDER — ONDANSETRON 4 MG PO TBDP: 4.0000 mg | ORAL_TABLET | Freq: Four times a day (QID) | ORAL | Status: DC | PRN

## 2023-12-15 MED ORDER — IRON 90 (18 FE) MG PO TABS
ORAL_TABLET | Freq: Every evening | ORAL | Status: DC
Start: 2023-12-15 — End: 2023-12-15

## 2023-12-15 MED ORDER — ALBUTEROL SULFATE HFA 108 (90 BASE) MCG/ACT IN AERS: 2.0000 | INHALATION_SPRAY | RESPIRATORY_TRACT | Status: DC | PRN

## 2023-12-15 MED ORDER — KCL IN DEXTROSE-NACL 20-5-0.45 MEQ/L-%-% IV SOLN
INTRAVENOUS | Status: AC
  Filled 2023-12-15 (×2): qty 1000

## 2023-12-15 MED ORDER — DIPHENHYDRAMINE HCL 50 MG/ML IJ SOLN: 25.0000 mg | Freq: Four times a day (QID) | INTRAMUSCULAR | Status: DC | PRN

## 2023-12-15 MED ORDER — SODIUM CHLORIDE 0.9 % IV SOLN
3.0000 g | Freq: Four times a day (QID) | INTRAVENOUS | Status: DC
  Administered 2023-12-15 – 2023-12-17 (×7): 3 g via INTRAVENOUS
  Filled 2023-12-15 (×8): qty 8

## 2023-12-15 MED ORDER — DAPAGLIFLOZIN PROPANEDIOL 10 MG PO TABS
10.0000 mg | ORAL_TABLET | Freq: Every day | ORAL | Status: DC
  Filled 2023-12-15: qty 1

## 2023-12-15 MED ORDER — FENOFIBRATE 160 MG PO TABS
160.0000 mg | ORAL_TABLET | Freq: Every day | ORAL | Status: DC
  Administered 2023-12-17: 160 mg via ORAL
  Filled 2023-12-15: qty 1

## 2023-12-15 MED ORDER — RIVAROXABAN 10 MG PO TABS
20.0000 mg | ORAL_TABLET | Freq: Every day | ORAL | Status: DC
  Administered 2023-12-16: 20 mg via ORAL
  Filled 2023-12-15: qty 2

## 2023-12-15 MED ORDER — DIPHENHYDRAMINE HCL 25 MG PO CAPS: 25.0000 mg | ORAL_CAPSULE | Freq: Four times a day (QID) | ORAL | Status: DC | PRN

## 2023-12-15 MED ORDER — METFORMIN HCL ER 500 MG PO TB24: 1000.0000 mg | ORAL_TABLET | Freq: Every day | ORAL | Status: DC

## 2023-12-15 MED ORDER — DAPAGLIFLOZIN PROPANEDIOL 10 MG PO TABS
10.0000 mg | ORAL_TABLET | Freq: Every day | ORAL | Status: DC
  Administered 2023-12-17: 10 mg via ORAL
  Filled 2023-12-15 (×2): qty 1

## 2023-12-15 MED ORDER — SENNA 8.6 MG PO TABS
1.0000 | ORAL_TABLET | Freq: Two times a day (BID) | ORAL | Status: DC
  Administered 2023-12-16 – 2023-12-17 (×2): 8.6 mg via ORAL
  Filled 2023-12-15 (×3): qty 1

## 2023-12-15 MED ORDER — ALPRAZOLAM 0.5 MG PO TABS: 0.5000 mg | ORAL_TABLET | Freq: Every day | ORAL | Status: DC | PRN

## 2023-12-15 MED ORDER — METFORMIN HCL ER 500 MG PO TB24
1000.0000 mg | ORAL_TABLET | Freq: Every day | ORAL | Status: DC
  Administered 2023-12-17: 1000 mg via ORAL
  Filled 2023-12-15: qty 2

## 2023-12-15 NOTE — H&P (Signed)
   PROVIDER:  Elenora Gamma, MD  MRN: [id number] DOB: [birth year] DATE OF ENCOUNTER: 12/15/2023 Interval History:   Pt with chronic pelvic abscess s/p completion proctectomy ~2 weeks ago.  He has developed some wound necrosis around the edges and is here for evaluation  Physical Examination:   Gen: NAD  Incision: necrosis of skin and fat ~2-3 cm circumferentially around the wound, debrided partially today  Assessment and Plan:   Steven [name] is a 53 y.o. male who underwent completion proctectomy in early March 2025.  He has some necrosis around his wound that is preventing it from healing.  I would like to proceed with debridement.  Will admit to floor for IV antibiotics.       The plan was discussed in detail with the patient today, who expressed understanding.  The patient has my contact information, and understands to call me with any additional questions or concerns in the interval.  I would be happy to see the patient back sooner if the need arises.   Vanita Panda, MD Colon and Rectal Surgery [facility]

## 2023-12-15 NOTE — H&P (Deleted)
   The note originally documented on this encounter has been moved the the encounter in which it belongs.

## 2023-12-15 NOTE — Plan of Care (Signed)
   Problem: Education: Goal: Knowledge of General Education information will improve Description: Including pain rating scale, medication(s)/side effects and non-pharmacologic comfort measures Outcome: Progressing   Problem: Coping: Goal: Level of anxiety will decrease Outcome: Progressing

## 2023-12-16 ENCOUNTER — Observation Stay (HOSPITAL_COMMUNITY): Admitting: Anesthesiology

## 2023-12-16 ENCOUNTER — Other Ambulatory Visit: Payer: Self-pay

## 2023-12-16 ENCOUNTER — Encounter (HOSPITAL_COMMUNITY): Payer: Self-pay | Admitting: General Surgery

## 2023-12-16 ENCOUNTER — Encounter (HOSPITAL_COMMUNITY)
Admission: EM | Disposition: A | Discharge status: Home / Self Care | Payer: Self-pay | Source: Home / Self Care | Attending: General Surgery

## 2023-12-16 DIAGNOSIS — L7682 Other postprocedural complications of skin and subcutaneous tissue: Secondary | ICD-10-CM | POA: Diagnosis present

## 2023-12-16 DIAGNOSIS — Z91041 Radiographic dye allergy status: Secondary | ICD-10-CM | POA: Diagnosis not present

## 2023-12-16 DIAGNOSIS — T8189XA Other complications of procedures, not elsewhere classified, initial encounter: Secondary | ICD-10-CM | POA: Diagnosis present

## 2023-12-16 DIAGNOSIS — I96 Gangrene, not elsewhere classified: Secondary | ICD-10-CM | POA: Diagnosis present

## 2023-12-16 DIAGNOSIS — Y831 Surgical operation with implant of artificial internal device as the cause of abnormal reaction of the patient, or of later complication, without mention of misadventure at the time of the procedure: Secondary | ICD-10-CM | POA: Diagnosis present

## 2023-12-16 HISTORY — PX: INCISION AND DRAINAGE PERIRECTAL ABSCESS: SHX1804

## 2023-12-16 LAB — BASIC METABOLIC PANEL
Anion gap: 11 (ref 5–15)
BUN: 14 mg/dL (ref 6–20)
CO2: 20 mmol/L — ABNORMAL LOW (ref 22–32)
Calcium: 8.1 mg/dL — ABNORMAL LOW (ref 8.9–10.3)
Chloride: 102 mmol/L (ref 98–111)
Creatinine, Ser: 0.78 mg/dL (ref 0.61–1.24)
GFR, Estimated: >60 mL/min (ref >60)
Glucose, Bld: 212 mg/dL — ABNORMAL HIGH (ref 70–99)
Potassium: 4 mmol/L (ref 3.5–5.1)
Sodium: 133 mmol/L — ABNORMAL LOW (ref 135–145)

## 2023-12-16 LAB — MRSA NEXT GEN BY PCR, NASAL: MRSA by PCR Next Gen: NOT DETECTED

## 2023-12-16 LAB — CBC
HCT: 25.8 % — ABNORMAL LOW (ref 39.0–52.0)
Hemoglobin: 7.6 g/dL — ABNORMAL LOW (ref 13.0–17.0)
MCH: 20.7 pg — ABNORMAL LOW (ref 26.0–34.0)
MCHC: 29.5 g/dL — ABNORMAL LOW (ref 30.0–36.0)
MCV: 70.3 fL — ABNORMAL LOW (ref 80.0–100.0)
Platelets: 649 10*3/uL — ABNORMAL HIGH (ref 150–400)
RBC: 3.67 MIL/uL — ABNORMAL LOW (ref 4.22–5.81)
RDW: 18.5 % — ABNORMAL HIGH (ref 11.5–15.5)
WBC: 10.7 10*3/uL — ABNORMAL HIGH (ref 4.0–10.5)
nRBC: 0 % (ref 0.0–0.2)

## 2023-12-16 LAB — GLUCOSE, CAPILLARY
Glucose-Capillary: 176 mg/dL — ABNORMAL HIGH (ref 70–99)
Glucose-Capillary: 159 mg/dL — ABNORMAL HIGH (ref 70–99)
Glucose-Capillary: 144 mg/dL — ABNORMAL HIGH (ref 70–99)
Glucose-Capillary: 229 mg/dL — ABNORMAL HIGH (ref 70–99)

## 2023-12-16 SURGERY — INCISION AND DRAINAGE, ABSCESS, PERIRECTAL
Anesthesia: General

## 2023-12-16 MED ORDER — SODIUM CHLORIDE 0.9 % IV SOLN
3.0000 g | Freq: Once | INTRAVENOUS | Status: AC
  Administered 2023-12-16: 3 g via INTRAVENOUS
  Filled 2023-12-16: qty 3

## 2023-12-16 MED ORDER — ENSURE ENLIVE PO LIQD
237.0000 mL | Freq: Two times a day (BID) | ORAL | Status: DC
  Administered 2023-12-16 – 2023-12-17 (×2): 237 mL via ORAL

## 2023-12-16 MED ORDER — SIMETHICONE 80 MG PO CHEW
80.0000 mg | CHEWABLE_TABLET | Freq: Four times a day (QID) | ORAL | Status: DC | PRN
  Administered 2023-12-16: 80 mg via ORAL
  Filled 2023-12-16: qty 1

## 2023-12-16 MED ORDER — BUPIVACAINE-EPINEPHRINE 0.5% -1:200000 IJ SOLN
INTRAMUSCULAR | Status: DC | PRN
  Administered 2023-12-16: 30 mL

## 2023-12-16 MED ORDER — PHENYLEPHRINE 80 MCG/ML (10ML) SYRINGE FOR IV PUSH (FOR BLOOD PRESSURE SUPPORT)
PREFILLED_SYRINGE | INTRAVENOUS | Status: AC
Start: 2023-12-16 — End: ?
  Filled 2023-12-16: qty 10

## 2023-12-16 MED ORDER — BUPIVACAINE-EPINEPHRINE (PF) 0.5% -1:200000 IJ SOLN
INTRAMUSCULAR | Status: AC
  Filled 2023-12-16: qty 30

## 2023-12-16 MED ORDER — FENTANYL CITRATE PF 50 MCG/ML IJ SOSY
50.0000 ug | PREFILLED_SYRINGE | INTRAMUSCULAR | Status: DC
  Administered 2023-12-16: 50 ug via INTRAVENOUS
  Filled 2023-12-16: qty 2

## 2023-12-16 MED ORDER — PROPOFOL 10 MG/ML IV BOLUS
INTRAVENOUS | Status: AC
Start: 2023-12-16 — End: ?
  Filled 2023-12-16: qty 20

## 2023-12-16 MED ORDER — FENTANYL CITRATE (PF) 100 MCG/2ML IJ SOLN
INTRAMUSCULAR | Status: DC | PRN
  Administered 2023-12-16: 25 ug via INTRAVENOUS
  Administered 2023-12-16: 50 ug via INTRAVENOUS
  Administered 2023-12-16: 25 ug via INTRAVENOUS

## 2023-12-16 MED ORDER — LIDOCAINE HCL (PF) 2 % IJ SOLN
INTRAMUSCULAR | Status: AC
  Filled 2023-12-16: qty 5

## 2023-12-16 MED ORDER — INSULIN ASPART 100 UNIT/ML IJ SOLN
0.0000 [IU] | Freq: Three times a day (TID) | INTRAMUSCULAR | Status: DC
  Administered 2023-12-16: 3 [IU] via SUBCUTANEOUS
  Administered 2023-12-17: 11 [IU] via SUBCUTANEOUS
  Administered 2023-12-17: 4 [IU] via SUBCUTANEOUS

## 2023-12-16 MED ORDER — PROPOFOL 10 MG/ML IV BOLUS
INTRAVENOUS | Status: DC | PRN
Start: 2023-12-16 — End: 2023-12-16
  Administered 2023-12-16: 100 mg via INTRAVENOUS

## 2023-12-16 MED ORDER — ACETAMINOPHEN 500 MG PO TABS
1000.0000 mg | ORAL_TABLET | Freq: Once | ORAL | Status: AC
  Administered 2023-12-16: 1000 mg via ORAL
  Filled 2023-12-16: qty 2

## 2023-12-16 MED ORDER — HYDROMORPHONE HCL 1 MG/ML IJ SOLN
INTRAMUSCULAR | Status: DC | PRN
  Administered 2023-12-16 (×2): .25 mg via INTRAVENOUS

## 2023-12-16 MED ORDER — MIDAZOLAM HCL 2 MG/2ML IJ SOLN
INTRAMUSCULAR | Status: AC
  Filled 2023-12-16: qty 2

## 2023-12-16 MED ORDER — LACTATED RINGERS IV SOLN: INTRAVENOUS | Status: DC

## 2023-12-16 MED ORDER — FENTANYL CITRATE (PF) 100 MCG/2ML IJ SOLN
INTRAMUSCULAR | Status: AC
  Filled 2023-12-16: qty 2

## 2023-12-16 MED ORDER — SUCCINYLCHOLINE CHLORIDE 200 MG/10ML IV SOSY
PREFILLED_SYRINGE | INTRAVENOUS | Status: DC | PRN
  Administered 2023-12-16: 140 mg via INTRAVENOUS

## 2023-12-16 MED ORDER — PHENYLEPHRINE 80 MCG/ML (10ML) SYRINGE FOR IV PUSH (FOR BLOOD PRESSURE SUPPORT)
PREFILLED_SYRINGE | INTRAVENOUS | Status: DC | PRN
  Administered 2023-12-16: 160 ug via INTRAVENOUS

## 2023-12-16 MED ORDER — LACTATED RINGERS IV SOLN: INTRAVENOUS | Status: DC | PRN

## 2023-12-16 MED ORDER — 0.9 % SODIUM CHLORIDE (POUR BTL) OPTIME
TOPICAL | Status: DC | PRN
Start: 2023-12-16 — End: 2023-12-16
  Administered 2023-12-16: 1000 mL

## 2023-12-16 MED ORDER — CHLORHEXIDINE GLUCONATE 0.12 % MT SOLN
15.0000 mL | Freq: Once | OROMUCOSAL | Status: AC
  Administered 2023-12-16: 15 mL via OROMUCOSAL

## 2023-12-16 MED ORDER — INSULIN ASPART 100 UNIT/ML IJ SOLN
0.0000 [IU] | Freq: Every day | INTRAMUSCULAR | Status: DC
  Administered 2023-12-16: 2 [IU] via SUBCUTANEOUS

## 2023-12-16 MED ORDER — HYDROMORPHONE HCL 2 MG/ML IJ SOLN
INTRAMUSCULAR | Status: AC
  Filled 2023-12-16: qty 1

## 2023-12-16 MED ORDER — LIDOCAINE HCL (CARDIAC) PF 100 MG/5ML IV SOSY
PREFILLED_SYRINGE | INTRAVENOUS | Status: DC | PRN
  Administered 2023-12-16: 60 mg via INTRAVENOUS

## 2023-12-16 MED ORDER — MIDAZOLAM HCL 5 MG/5ML IJ SOLN
INTRAMUSCULAR | Status: DC | PRN
  Administered 2023-12-16: 2 mg via INTRAVENOUS

## 2023-12-16 MED ORDER — INSULIN ASPART 100 UNIT/ML IJ SOLN
0.0000 [IU] | INTRAMUSCULAR | Status: DC | PRN
  Administered 2023-12-16: 2 [IU] via SUBCUTANEOUS
  Filled 2023-12-16: qty 1

## 2023-12-16 MED ORDER — ONDANSETRON HCL 4 MG/2ML IJ SOLN
INTRAMUSCULAR | Status: DC | PRN
  Administered 2023-12-16: 4 mg via INTRAVENOUS

## 2023-12-16 MED ORDER — ROCURONIUM BROMIDE 10 MG/ML (PF) SYRINGE
PREFILLED_SYRINGE | INTRAVENOUS | Status: AC
  Filled 2023-12-16: qty 10

## 2023-12-16 SURGICAL SUPPLY — 17 items
BAG COUNTER SPONGE SURGICOUNT (BAG) IMPLANT
BLADE SURG 15 STRL LF DISP TIS (BLADE) ×1 IMPLANT
BNDG GAUZE DERMACEA FLUFF 4 (GAUZE/BANDAGES/DRESSINGS) IMPLANT
DRAIN PENROSE 0.25X18 (DRAIN) IMPLANT
GAUZE 4X4 16PLY ~~LOC~~+RFID DBL (SPONGE) ×1 IMPLANT
GAUZE PAD ABD 8X10 STRL (GAUZE/BANDAGES/DRESSINGS) IMPLANT
GAUZE SPONGE 4X4 12PLY STRL (GAUZE/BANDAGES/DRESSINGS) IMPLANT
GLOVE BIO SURGEON STRL SZ 6.5 (GLOVE) ×1 IMPLANT
GLOVE INDICATOR 6.5 STRL GRN (GLOVE) ×2 IMPLANT
GOWN STRL REUS W/ TWL XL LVL3 (GOWN DISPOSABLE) ×2 IMPLANT
PACK LITHOTOMY IV (CUSTOM PROCEDURE TRAY) ×1 IMPLANT
PENCIL SMOKE EVACUATOR (MISCELLANEOUS) IMPLANT
SURGILUBE 2OZ TUBE FLIPTOP (MISCELLANEOUS) ×1 IMPLANT
SUT SILK 2 0 SH (SUTURE) IMPLANT
SUT SILK 2-0 18XBRD TIE 12 (SUTURE) IMPLANT
SUT SILK 3 0 SH 30 (SUTURE) IMPLANT
TOWEL OR 17X26 10 PK STRL BLUE (TOWEL DISPOSABLE) ×1 IMPLANT

## 2023-12-16 NOTE — Progress Notes (Signed)
 Necrosis of surgical wound (HCC)  Subjective: No acute events overnight, having some bloating this AM and last night  Objective: Vital signs in last 24 hours: Temp:  [97.8 F (36.6 C)-98.7 F (37.1 C)] 97.9 F (36.6 C) (03/19 0551) Pulse Rate:  [94-109] 105 (03/19 0551) Resp:  [16-18] 18 (03/19 0551) BP: (120-143)/(63-74) 139/69 (03/19 0551) SpO2:  [97 %-100 %] 99 % (03/19 0551) Last BM Date : 12/15/23  Intake/Output from previous day: 03/18 0701 - 03/19 0700 In: 1299.8 [P.O.:240; I.V.:674.1; IV Piggyback:385.7] Out: 1600 [Urine:1600] Intake/Output this shift: No intake/output data recorded.  General appearance: alert and cooperative GI: soft  Lab Results:  Results for orders placed or performed during the hospital encounter of 12/15/23 (from the past 24 hours)  Creatinine, serum     Status: None   Collection Time: 12/15/23 11:13 AM  Result Value Ref Range   Creatinine, Ser 0.84 0.61 - 1.24 mg/dL   GFR, Estimated >81 >19 mL/min  Basic metabolic panel     Status: Abnormal   Collection Time: 12/16/23  5:06 AM  Result Value Ref Range   Sodium 133 (L) 135 - 145 mmol/L   Potassium 4.0 3.5 - 5.1 mmol/L   Chloride 102 98 - 111 mmol/L   CO2 20 (L) 22 - 32 mmol/L   Glucose, Bld 212 (H) 70 - 99 mg/dL   BUN 14 6 - 20 mg/dL   Creatinine, Ser 1.47 0.61 - 1.24 mg/dL   Calcium 8.1 (L) 8.9 - 10.3 mg/dL   GFR, Estimated >82 >95 mL/min   Anion gap 11 5 - 15  CBC     Status: Abnormal   Collection Time: 12/16/23  5:06 AM  Result Value Ref Range   WBC 10.7 (H) 4.0 - 10.5 K/uL   RBC 3.67 (L) 4.22 - 5.81 MIL/uL   Hemoglobin 7.6 (L) 13.0 - 17.0 g/dL   HCT 62.1 (L) 30.8 - 65.7 %   MCV 70.3 (L) 80.0 - 100.0 fL   MCH 20.7 (L) 26.0 - 34.0 pg   MCHC 29.5 (L) 30.0 - 36.0 g/dL   RDW 84.6 (H) 96.2 - 95.2 %   Platelets 649 (H) 150 - 400 K/uL   nRBC 0.0 0.0 - 0.2 %     Studies/Results Radiology     MEDS, Scheduled  dapagliflozin propanediol  10 mg Oral Daily   And   metFORMIN   1,000 mg Oral Q breakfast   fenofibrate  160 mg Oral Daily   ferrous sulfate  325 mg Oral QHS   rivaroxaban  20 mg Oral Q supper   senna  1 tablet Oral BID     Assessment: Necrosis of surgical wound (HCC)   Plan: OR today for debridement    LOS: 1 day    Vanita Panda, MD [facility], [place]     12/16/2023 8:32 AM

## 2023-12-16 NOTE — Transfer of Care (Signed)
 Immediate Anesthesia Transfer of Care Note  Patient: Steven [name]  Procedure(s) Performed: DEBRIDEMENT OF POST OP WOUND  Patient Location: PACU  Anesthesia Type:General  Level of Consciousness: awake, alert , and oriented  Airway & Oxygen Therapy: Patient Spontanous Breathing and Patient connected to face mask oxygen  Post-op Assessment: Report given to RN and Post -op Vital signs reviewed and stable  Post vital signs: stable  Last Vitals:  Vitals Value Taken Time  BP 135/70 12/16/23 1320  Temp    Pulse 106 12/16/23 1324  Resp    SpO2 100 % 12/16/23 1324  Vitals shown include unfiled device data.  Last Pain:  Vitals:   12/16/23 1134  TempSrc:   PainSc: 6       Patients Stated Pain Goal: 2 (12/15/23 1658)  Complications: No notable events documented.

## 2023-12-16 NOTE — Op Note (Signed)
 12/16/2023  12:57 PM  PATIENT:  Steven [name]  54 y.o. male  Patient Care Team: Noberto Retort, MD as PCP - General (Family Medicine) Rachel Moulds, MD as Consulting Physician (Hematology and Oncology) Romie Levee, MD as Consulting Physician (General Surgery) Causey, Larna Daughters, NP as Nurse Practitioner (Hematology and Oncology) Dorothy Puffer, MD as Consulting Physician (Radiation Oncology) Mansouraty, Netty Starring., MD as Consulting Physician (Gastroenterology)  PRE-OPERATIVE DIAGNOSIS:  Necrotic postoperative wound  POST-OPERATIVE DIAGNOSIS:  Necrotic postoperative wound  PROCEDURE:  EXCISIONAL DEBRIDEMENT OF POST OP WOUND    Surgeon(s): Romie Levee, MD  ASSISTANT: none   ANESTHESIA:   local and general  EBL: 10ml  SPECIMEN:  No Specimen  DISPOSITION OF SPECIMEN:  N/A  [name]:  YES  PLAN OF CARE:  Pt already admitted  PATIENT DISPOSITION:  PACU - hemodynamically stable.  INDICATION: 54 y.o. M with h/o pelvic irradiation who is ~2 wks s/p completion proctectomy for chronic pelvic abscess  Frequency of debridement: once Area of body debrided: perineum Presence and extent of infected tissue: minimal Presence and extent of non viable tissue: extensive around wound  OR FINDINGS: Pt with wound necrosis of his open perineal wound extending posteriorly to the level of the sacrum  DESCRIPTION: The patient was identified in the pre op holding area and taken to the OR, where they were laid supine on the OR table.  General anesthesia was induced.  The operative area was prepped and draped in the usual sterile fashion.  A surgical time out was performed, indicating the correct patient, procedure, positioning and pre-operative antibiotics.   After this was completed, the wound was measured to be 6x6cm.  The total area of devitalized tissue was 30 square cm.  A sharp instrument was used to debride the wound.  Debridement was carried down to the level of bone  posteriorly.  Devitalized skin, fat and muscle were removed.  There was necrotic material in the wound that would inhibit healing and or promote adjacent tissue breakdown.  At the end of the procedure the debrided area measured 15 x15cm.  The wound was then packed with a Kerlix roll sponge and covered with a sterile dressing.  The patient was awakened from anesthesia and sent to the PACU in stable condition.  All [name] were correct per OR staff.  Vanita Panda, MD  Colorectal and General Surgery [facility]

## 2023-12-16 NOTE — Anesthesia Preprocedure Evaluation (Addendum)
 Anesthesia Evaluation  Patient identified by MRN, date of birth, ID band Patient awake    Reviewed: Allergy & Precautions, NPO status , Patient's Chart, lab work & pertinent test results  Airway Mallampati: II  TM Distance: >3 FB Neck ROM: Full    Dental no notable dental hx. (+) Teeth Intact, Dental Advisory Given   Pulmonary asthma , sleep apnea , PE   Pulmonary exam normal breath sounds clear to auscultation       Cardiovascular hypertension, + Peripheral Vascular Disease and + DVT  Normal cardiovascular exam Rhythm:Regular Rate:Normal  TTE 2021 1. Left ventricular ejection fraction, by estimation, is 55 to 60%. The  left ventricle has normal function. The left ventricle has no regional  wall motion abnormalities. Left ventricular diastolic parameters are  consistent with Grade I diastolic  dysfunction (impaired relaxation).   2. Right ventricular systolic function is mildly reduced. The right  ventricular size is mildly enlarged. There is mildly elevated pulmonary  artery systolic pressure.   3. The mitral valve is normal in structure. No evidence of mitral valve  regurgitation. No evidence of mitral stenosis.   4. The aortic valve is grossly normal. Aortic valve regurgitation is not  visualized. No aortic stenosis is present.   5. Aortic dilatation noted. There is borderline dilatation of the  ascending aorta, measuring 38 mm.   6. The inferior vena cava is dilated in size with <50% respiratory  variability, suggesting right atrial pressure of 15 mmHg.     Neuro/Psych  PSYCHIATRIC DISORDERS Anxiety Depression    negative neurological ROS     GI/Hepatic Neg liver ROS,GERD  ,,  Endo/Other  diabetes, Type 2, Oral Hypoglycemic Agents    Renal/GU negative Renal ROS  negative genitourinary   Musculoskeletal negative musculoskeletal ROS (+)    Abdominal   Peds  Hematology  (+) Blood dyscrasia (xarelto), anemia  Lab Results      Component                Value               Date                      WBC                      10.7 (H)            12/16/2023                HGB                      7.6 (L)             12/16/2023                HCT                      25.8 (L)            12/16/2023                MCV                      70.3 (L)            12/16/2023                PLT  649 (H)             12/16/2023              Anesthesia Other Findings Rectal CA  Reproductive/Obstetrics                             Anesthesia Physical Anesthesia Plan  ASA: 3  Anesthesia Plan: General   Post-op Pain Management: Ketamine IV* and Tylenol PO (pre-op)*   Induction: Intravenous  PONV Risk Score and Plan: 3 and Midazolam, Dexamethasone and Ondansetron  Airway Management Planned: Oral ETT  Additional Equipment:   Intra-op Plan:   Post-operative Plan: Extubation in OR  Informed Consent: I have reviewed the patients History and Physical, chart, labs and discussed the procedure including the risks, benefits and alternatives for the proposed anesthesia with the patient or authorized representative who has indicated his/her understanding and acceptance.     Dental advisory given  Plan Discussed with: CRNA  Anesthesia Plan Comments:        Anesthesia Quick Evaluation

## 2023-12-16 NOTE — Anesthesia Procedure Notes (Signed)
 Procedure Name: Intubation Date/Time: 12/16/2023 12:02 PM  Performed by: Micki Riley, CRNAPre-anesthesia Checklist: Patient identified, Emergency Drugs available, Suction available and Patient being monitored Patient Re-evaluated:Patient Re-evaluated prior to induction Oxygen Delivery Method: Circle System Utilized Preoxygenation: Pre-oxygenation with 100% oxygen Induction Type: IV induction Ventilation: Mask ventilation without difficulty Grade View: Grade II Tube type: Oral Tube size: 7.5 mm Number of attempts: 1 Airway Equipment and Method: Stylet and Oral airway Placement Confirmation: ETT inserted through vocal cords under direct vision, positive ETCO2 and breath sounds checked- equal and bilateral Secured at: 23 cm Tube secured with: Tape Dental Injury: Teeth and Oropharynx as per pre-operative assessment

## 2023-12-17 ENCOUNTER — Encounter (HOSPITAL_COMMUNITY): Payer: Self-pay | Admitting: General Surgery

## 2023-12-17 ENCOUNTER — Other Ambulatory Visit (HOSPITAL_COMMUNITY): Payer: Self-pay

## 2023-12-17 LAB — GLUCOSE, CAPILLARY
Glucose-Capillary: 253 mg/dL — ABNORMAL HIGH (ref 70–99)
Glucose-Capillary: 184 mg/dL — ABNORMAL HIGH (ref 70–99)

## 2023-12-17 MED ORDER — METHOCARBAMOL 500 MG PO TABS
1000.0000 mg | ORAL_TABLET | Freq: Four times a day (QID) | ORAL | Status: DC | PRN
  Administered 2023-12-17: 1000 mg via ORAL
  Filled 2023-12-17: qty 2

## 2023-12-17 MED ORDER — COMBINE ABD 5"X9" PADS
1.0000 [IU] | MEDICATED_PAD | Freq: Every day | 3 refills | Status: AC
  Filled 2023-12-17: qty 30, fill #0

## 2023-12-17 MED ORDER — AMOXICILLIN-POT CLAVULANATE 875-125 MG PO TABS
1.0000 | ORAL_TABLET | Freq: Two times a day (BID) | ORAL | Status: DC
  Administered 2023-12-17: 1 via ORAL
  Filled 2023-12-17: qty 1

## 2023-12-17 MED ORDER — HYDROCODONE-ACETAMINOPHEN 5-325 MG PO TABS
1.0000 | ORAL_TABLET | Freq: Four times a day (QID) | ORAL | 0 refills | Status: DC | PRN
Start: 2023-12-17 — End: 2024-02-11
  Filled 2023-12-17: qty 30, 4d supply, fill #0

## 2023-12-17 MED ORDER — KERLIX GAUZE ROLL LARGE MISC
1.0000 [IU] | Freq: Every day | 3 refills | Status: AC
  Filled 2023-12-17: qty 30, fill #0

## 2023-12-17 MED ORDER — METHOCARBAMOL 500 MG PO TABS
1000.0000 mg | ORAL_TABLET | Freq: Three times a day (TID) | ORAL | 0 refills | Status: DC | PRN
  Filled 2023-12-17: qty 60, 10d supply, fill #0

## 2023-12-17 NOTE — Discharge Summary (Signed)
 Physician Discharge Summary  Patient ID: Steven [name] MRN: [id number] DOB/AGE: [birth year] 53 y.o.  Admit date: 12/15/2023 Discharge date: 12/17/2023  Admission Diagnoses: Wound necrosis Discharge Diagnoses:  Principal Problem:   Necrosis of surgical wound [facility])   Discharged Condition: good  Hospital Course: Pt admitted for wound necrosis of his open surgical wound.  He was taken to the OR and debrided.  Discharged in stable condition the following day.  Consults: None  Significant Diagnostic Studies: labs: cbc, bmet  Treatments: IV hydration, antibiotics: Unasyn, and surgery: debridement  Discharge Exam: Blood pressure 126/61, pulse (!) 110, temperature 99.1 F (37.3 C), temperature source Oral, resp. rate 18, height 5\' 10"  (1.778 m), weight 88 kg, SpO2 95%. General appearance: alert and cooperative GI: soft, non-distended  Disposition: Discharge disposition: 01-Home or Self Care        Allergies as of 12/17/2023       Reactions   Contrast Media [iodinated Contrast Media] Hives, Nausea Only   As a 53 year old experienced hives and nausea        Medication List     STOP taking these medications    traMADol 50 MG tablet Commonly known as: ULTRAM       TAKE these medications    acetaminophen 500 MG tablet Commonly known as: TYLENOL Take 1,000 mg by mouth every 6 (six) hours as needed for mild pain.   ALPRAZolam 0.5 MG tablet Commonly known as: XANAX Take 0.5 mg by mouth daily as needed for anxiety.   amoxicillin-clavulanate 875-125 MG tablet Commonly known as: AUGMENTIN Take 1 tablet by mouth 2 (two) times daily.   calcium carbonate 500 MG chewable tablet Commonly known as: TUMS - dosed in mg elemental calcium Chew 2 tablets by mouth daily as needed for indigestion or heartburn.   fenofibrate 160 MG tablet Take 160 mg by mouth daily.   HYDROcodone-acetaminophen 5-325 MG tablet Commonly known as: NORCO/VICODIN Take 1-2 tablets by mouth every  6 (six) hours as needed for moderate pain (pain score 4-6).   ibuprofen 200 MG tablet Commonly known as: ADVIL Take 600 mg by mouth every 6 (six) hours as needed for moderate pain.   IRON PO Take 1 tablet by mouth every evening.   Kerlix Gauze Roll Large Misc 1 Units by Does not apply route daily.   Combine ABD 5"X9" Pads 1 Units by Does not apply route daily.   Methocarbamol 1000 MG Tabs Take 1,000 mg by mouth every 8 (eight) hours as needed for muscle spasms.   naproxen sodium 220 MG tablet Commonly known as: ALEVE Take 220 mg by mouth 2 (two) times daily as needed (pain).   ProAir HFA 108 (90 Base) MCG/ACT inhaler Generic drug: albuterol Inhale 2 puffs into the lungs every 4 (four) hours as needed for wheezing or shortness of breath.   rivaroxaban 20 MG Tabs tablet Commonly known as: XARELTO Take 1 tablet (20 mg total) by mouth daily with supper.   tetrahydrozoline-zinc 0.05-0.25 % ophthalmic solution Commonly known as: VISINE-AC Place 2 drops into both eyes 4 (four) times daily as needed (allergy).   Xigduo XR 06-999 MG Tb24 Generic drug: Dapagliflozin Pro-metFORMIN ER Take 1 tablet by mouth daily.        Follow-up Information     Romie Levee, MD. Schedule an appointment as soon as possible for a visit in 2 week(s).   Specialties: General Surgery, Colon and Rectal Surgery Why: our office should call you with time and date Contact information: [address] [place] [address] [phone]                 Signed: Vanita Panda 12/17/2023, 11:25 AM

## 2023-12-17 NOTE — Discharge Instructions (Signed)
 Remove packing daily. Wash with soap and water.  Ok to shower with open wound.  Pack wound with moistened gauze daily. Cover with a dry dressing.    Avoid sitting as much as possible for the next 2 weeks.

## 2023-12-17 NOTE — Anesthesia Postprocedure Evaluation (Signed)
 Anesthesia Post Note  Patient: Landin Tallon  Procedure(s) Performed: DEBRIDEMENT OF POST OP WOUND     Patient location during evaluation: PACU Anesthesia Type: General Level of consciousness: awake and alert Pain management: pain level controlled Vital Signs Assessment: post-procedure vital signs reviewed and stable Respiratory status: spontaneous breathing, nonlabored ventilation, respiratory function stable and patient connected to nasal cannula oxygen Cardiovascular status: blood pressure returned to baseline and stable Postop Assessment: no apparent nausea or vomiting Anesthetic complications: no  No notable events documented.  Last Vitals:  Vitals:   12/16/23 2130 12/17/23 0157  BP: 122/62 126/61  Pulse: (!) 110 (!) 110  Resp: 18 18  Temp: 37.2 C 37.3 C  SpO2: 98% 95%    Last Pain:  Vitals:   12/17/23 1054  TempSrc:   PainSc: 3                  Ascher Schroepfer L Natika Geyer

## 2023-12-17 NOTE — TOC Initial Note (Signed)
 Transition of Care [facility]) - Initial/Assessment Note    Patient Details  Name: Steven [name] MRN: [id number] Date of Birth: [birth year]  Transition of [facility] CM/SW Contact:    Steven Prows, RN Phone Number: 12/17/2023, 2:20 PM  Clinical Narrative:                 Sherron Monday w/ pt and [name] in room; pt says he lives at home; he plans to return at d/c; his wife can be contacted at [phone]; he verified insurance/PCP; pt denied SDOH risks; his wife will provide transportation; pt says he does not have DME; he has HHRN from [facility]; pt says he does not have home oxygen; Artavia at [facility] notified that pt will d/c today; agency contact info placed in follow up provider section of d/c instructions; per Steven Bath, RN pt's family has been educated on wound care; no TOC needs.  Expected Discharge Plan: Home w Home Health Services Barriers to Discharge: No Barriers Identified   Patient Goals and CMS Choice Patient states their goals for this hospitalization and ongoing recovery are:: home CMS Medicare.gov Compare Post Acute Care list provided to:: Patient        Expected Discharge Plan and Services   Discharge Planning Services: CM Consult Post Acute Care Choice: Resumption of Svcs/PTA Provider Living arrangements for the past 2 months: Single Family Home Expected Discharge Date: 12/17/23               DME Arranged: N/A DME Agency: NA       HH Arranged: RN HH Agency: [facility] Date HH Agency Contacted: 12/17/23 Time HH Agency Contacted: 1416 Representative spoke with at [facility] Agency: Adele Dan  Prior Living Arrangements/Services Living arrangements for the past 2 months: Single Family Home Lives with:: Spouse Patient language and need for interpreter reviewed:: Yes Do you feel safe going back to the place where you live?: Yes      Need for Family Participation in Patient Care: Yes (Comment) Care giver support system in place?: Yes  (comment) Current home services: Home RN Criminal Activity/Legal Involvement Pertinent to Current Situation/Hospitalization: No - Comment as needed  Activities of Daily Living   ADL Screening (condition at time of admission) Independently performs ADLs?: Yes (appropriate for developmental age) Is the patient deaf or have difficulty hearing?: No Does the patient have difficulty seeing, even when wearing glasses/contacts?: No Does the patient have difficulty concentrating, remembering, or making decisions?: No  Permission Sought/Granted Permission sought to share information with : Case Manager Permission granted to share information with : Yes, Verbal Permission Granted  Share Information with NAME: Case Manager     Permission granted to share info w Relationship: Steven [name] (spouse) [phone]     Emotional Assessment Appearance:: Appears stated age Attitude/Demeanor/Rapport: Gracious Affect (typically observed): Accepting Orientation: : Oriented to Self, Oriented to Place, Oriented to  Time, Oriented to Situation Alcohol / Substance Use: Not Applicable Psych Involvement: No (comment)  Admission diagnosis:  Necrosis of surgical wound (HCC) [I97.89, I96] Patient Active Problem List   Diagnosis Date Noted   Necrosis of surgical wound (HCC) 12/15/2023   AKI (acute kidney injury) (HCC) 11/30/2023   Chronic pelvic abscess due to rectal stump disruption 11/29/2023   Parastomal hernia without obstruction or gangrene 11/29/2023   Colostomy in place [facility]) 11/29/2023   History of pulmonary embolus (PE) 11/29/2023   Chronic anticoagulation 11/29/2023   Intraperitoneal abscess (HCC) 11/29/2023   Peripheral neuropathy due to chemotherapy (HCC) 07/07/2022  Cancer related pain 04/30/2021   Tenesmus 04/16/2021   Chemotherapy-induced nausea 04/03/2021   Adenocarcinoma of rectum (HCC) 03/21/2021   Sensorineural hearing loss (SNHL) of both ears 01/21/2021   Gastroesophageal reflux  disease 12/26/2020   Subjective hearing loss 12/18/2020   Uncontrolled diabetes mellitus with hyperglycemia (HCC) 04/26/2019   Obesity (BMI 30-39.9) 04/26/2019   Asthma    Anxiety    OSA (obstructive sleep apnea)    HLD (hyperlipidemia)    PCP:  Noberto Retort, MD Pharmacy:   [facility] [phone] [place], [place] - [address] AT [facility] OF [address] [place] [address] Phone: [phone] Fax: [phone]  [facility] - [place], [place] - [phone] [place] [address] [place] [address] Phone: [phone] Fax: [phone]  [facility] [place], [place] - [place] [address] [place] [address] Phone: [phone] Fax: [phone]     Social Drivers of Health (SDOH) Social History: SDOH Screenings   Food Insecurity: No Food Insecurity (12/17/2023)  Housing: Low Risk  (12/17/2023)  Transportation Needs: No Transportation Needs (12/17/2023)  Utilities: Not At Risk (12/17/2023)  Social Connections: Socially Integrated (11/30/2023)  Tobacco Use: Low Risk  (12/16/2023)   SDOH Interventions: Food Insecurity Interventions: Intervention Not Indicated, Inpatient TOC Housing Interventions: Intervention Not Indicated, Inpatient TOC Transportation Interventions: Intervention Not Indicated, Inpatient TOC Utilities Interventions: Intervention Not Indicated, Inpatient TOC   Readmission Risk Interventions    12/07/2023    2:23 PM  Readmission Risk Prevention Plan  Transportation Screening Complete  PCP or Specialist Appt within 5-7 Days Complete  Home Care Screening Complete  Medication Review (RN CM) Complete

## 2023-12-17 NOTE — Progress Notes (Signed)
 1 Day Post-Op  Subjective: Did well overnight, c/o pain in side near ribs  Objective: Vital signs in last 24 hours: Temp:  [97.5 F (36.4 C)-99.1 F (37.3 C)] 99.1 F (37.3 C) (03/20 0157) Pulse Rate:  [88-110] 110 (03/20 0157) Resp:  [17-18] 18 (03/20 0157) BP: (118-147)/(61-78) 126/61 (03/20 0157) SpO2:  [92 %-100 %] 95 % (03/20 0157) Weight:  [88 kg] 88 kg (03/19 1100)   Intake/Output from previous day: 03/19 0701 - 03/20 0700 In: 2148.6 [P.O.:1080; I.V.:768.6; IV Piggyback:300] Out: 2500 [Urine:2500] Intake/Output this shift: No intake/output data recorded.   General appearance: alert and cooperative  Incision: packed  Lab Results:  Recent Labs    12/16/23 0506  WBC 10.7*  HGB 7.6*  HCT 25.8*  PLT 649*   BMET Recent Labs    12/15/23 1113 12/16/23 0506  NA  --  133*  K  --  4.0  CL  --  102  CO2  --  20*  GLUCOSE  --  212*  BUN  --  14  CREATININE 0.84 0.78  CALCIUM  --  8.1*   PT/INR No results for input(s): "LABPROT", "INR" in the last 72 hours. ABG No results for input(s): "PHART", "HCO3" in the last 72 hours.  Invalid input(s): "PCO2", "PO2"  MEDS, Scheduled  amoxicillin-clavulanate  1 tablet Oral Q12H   dapagliflozin propanediol  10 mg Oral Daily   And   metFORMIN  1,000 mg Oral Q breakfast   feeding supplement  237 mL Oral BID BM   fenofibrate  160 mg Oral Daily   ferrous sulfate  325 mg Oral QHS   insulin aspart  0-20 Units Subcutaneous TID WC   insulin aspart  0-5 Units Subcutaneous QHS   rivaroxaban  20 mg Oral Q supper   senna  1 tablet Oral BID    Studies/Results: No results found.  Assessment: s/p Procedure(s): DEBRIDEMENT OF POST OP WOUND Patient Active Problem List   Diagnosis Date Noted   Necrosis of surgical wound (HCC) 12/15/2023   AKI (acute kidney injury) (HCC) 11/30/2023   Chronic pelvic abscess due to rectal stump disruption 11/29/2023   Parastomal hernia without obstruction or gangrene 11/29/2023   Colostomy  in place [facility]) 11/29/2023   History of pulmonary embolus (PE) 11/29/2023   Chronic anticoagulation 11/29/2023   Intraperitoneal abscess (HCC) 11/29/2023   Peripheral neuropathy due to chemotherapy (HCC) 07/07/2022   Cancer related pain 04/30/2021   Tenesmus 04/16/2021   Chemotherapy-induced nausea 04/03/2021   Adenocarcinoma of rectum (HCC) 03/21/2021   Sensorineural hearing loss (SNHL) of both ears 01/21/2021   Gastroesophageal reflux disease 12/26/2020   Subjective hearing loss 12/18/2020   Uncontrolled diabetes mellitus with hyperglycemia (HCC) 04/26/2019   Obesity (BMI 30-39.9) 04/26/2019   Asthma    Anxiety    OSA (obstructive sleep apnea)    HLD (hyperlipidemia)     Expected post op course  Plan: Dressing change BID Possible d/c today or tomorrow Robaxin for muscle spasms Ambulate  LOS: 2 days     .Vanita Panda, MD [facility], [place]    12/17/2023 8:16 AM

## 2023-12-19 DIAGNOSIS — K9189 Other postprocedural complications and disorders of digestive system: Secondary | ICD-10-CM | POA: Diagnosis not present

## 2023-12-19 DIAGNOSIS — E1165 Type 2 diabetes mellitus with hyperglycemia: Secondary | ICD-10-CM | POA: Diagnosis not present

## 2023-12-19 DIAGNOSIS — K219 Gastro-esophageal reflux disease without esophagitis: Secondary | ICD-10-CM | POA: Diagnosis not present

## 2023-12-19 DIAGNOSIS — C20 Malignant neoplasm of rectum: Secondary | ICD-10-CM | POA: Diagnosis not present

## 2023-12-19 DIAGNOSIS — Z7984 Long term (current) use of oral hypoglycemic drugs: Secondary | ICD-10-CM | POA: Diagnosis not present

## 2023-12-19 DIAGNOSIS — K635 Polyp of colon: Secondary | ICD-10-CM | POA: Diagnosis not present

## 2023-12-19 DIAGNOSIS — N179 Acute kidney failure, unspecified: Secondary | ICD-10-CM | POA: Diagnosis not present

## 2023-12-19 DIAGNOSIS — Z933 Colostomy status: Secondary | ICD-10-CM | POA: Diagnosis not present

## 2023-12-19 DIAGNOSIS — E669 Obesity, unspecified: Secondary | ICD-10-CM | POA: Diagnosis not present

## 2023-12-19 DIAGNOSIS — F419 Anxiety disorder, unspecified: Secondary | ICD-10-CM | POA: Diagnosis not present

## 2023-12-19 DIAGNOSIS — G4733 Obstructive sleep apnea (adult) (pediatric): Secondary | ICD-10-CM | POA: Diagnosis not present

## 2023-12-19 DIAGNOSIS — K651 Peritoneal abscess: Secondary | ICD-10-CM | POA: Diagnosis not present

## 2023-12-19 DIAGNOSIS — Z6827 Body mass index (BMI) 27.0-27.9, adult: Secondary | ICD-10-CM | POA: Diagnosis not present

## 2023-12-19 DIAGNOSIS — E785 Hyperlipidemia, unspecified: Secondary | ICD-10-CM | POA: Diagnosis not present

## 2023-12-19 DIAGNOSIS — H903 Sensorineural hearing loss, bilateral: Secondary | ICD-10-CM | POA: Diagnosis not present

## 2023-12-19 DIAGNOSIS — J45909 Unspecified asthma, uncomplicated: Secondary | ICD-10-CM | POA: Diagnosis not present

## 2023-12-25 ENCOUNTER — Other Ambulatory Visit: Payer: Self-pay

## 2023-12-25 ENCOUNTER — Inpatient Hospital Stay (HOSPITAL_COMMUNITY)
Admission: EM | Admit: 2023-12-25 | Discharge: 2024-01-13 | DRG: 856 | Disposition: A | Discharge status: Skilled Nursing Facility | Attending: General Surgery | Admitting: General Surgery

## 2023-12-25 ENCOUNTER — Inpatient Hospital Stay (HOSPITAL_COMMUNITY): Admitting: Anesthesiology

## 2023-12-25 ENCOUNTER — Encounter (HOSPITAL_COMMUNITY): Admission: EM | Disposition: A | Discharge status: Skilled Nursing Facility | Payer: Self-pay | Source: Home / Self Care

## 2023-12-25 ENCOUNTER — Encounter (HOSPITAL_COMMUNITY): Payer: Self-pay

## 2023-12-25 DIAGNOSIS — L598 Other specified disorders of the skin and subcutaneous tissue related to radiation: Secondary | ICD-10-CM | POA: Diagnosis present

## 2023-12-25 DIAGNOSIS — K6811 Postprocedural retroperitoneal abscess: Secondary | ICD-10-CM | POA: Diagnosis not present

## 2023-12-25 DIAGNOSIS — Z8546 Personal history of malignant neoplasm of prostate: Secondary | ICD-10-CM | POA: Diagnosis not present

## 2023-12-25 DIAGNOSIS — Z79899 Other long term (current) drug therapy: Secondary | ICD-10-CM

## 2023-12-25 DIAGNOSIS — S3723XA Laceration of bladder, initial encounter: Secondary | ICD-10-CM | POA: Diagnosis not present

## 2023-12-25 DIAGNOSIS — T8130XA Disruption of wound, unspecified, initial encounter: Secondary | ICD-10-CM | POA: Diagnosis not present

## 2023-12-25 DIAGNOSIS — Z7984 Long term (current) use of oral hypoglycemic drugs: Secondary | ICD-10-CM

## 2023-12-25 DIAGNOSIS — Z803 Family history of malignant neoplasm of breast: Secondary | ICD-10-CM

## 2023-12-25 DIAGNOSIS — L02215 Cutaneous abscess of perineum: Secondary | ICD-10-CM | POA: Diagnosis not present

## 2023-12-25 DIAGNOSIS — L7682 Other postprocedural complications of skin and subcutaneous tissue: Secondary | ICD-10-CM | POA: Diagnosis not present

## 2023-12-25 DIAGNOSIS — E1151 Type 2 diabetes mellitus with diabetic peripheral angiopathy without gangrene: Secondary | ICD-10-CM | POA: Diagnosis not present

## 2023-12-25 DIAGNOSIS — R710 Precipitous drop in hematocrit: Principal | ICD-10-CM

## 2023-12-25 DIAGNOSIS — E1165 Type 2 diabetes mellitus with hyperglycemia: Secondary | ICD-10-CM | POA: Diagnosis not present

## 2023-12-25 DIAGNOSIS — Z85048 Personal history of other malignant neoplasm of rectum, rectosigmoid junction, and anus: Secondary | ICD-10-CM | POA: Diagnosis not present

## 2023-12-25 DIAGNOSIS — E785 Hyperlipidemia, unspecified: Secondary | ICD-10-CM | POA: Diagnosis present

## 2023-12-25 DIAGNOSIS — G4733 Obstructive sleep apnea (adult) (pediatric): Secondary | ICD-10-CM | POA: Diagnosis present

## 2023-12-25 DIAGNOSIS — Z9079 Acquired absence of other genital organ(s): Secondary | ICD-10-CM

## 2023-12-25 DIAGNOSIS — Z7189 Other specified counseling: Secondary | ICD-10-CM | POA: Diagnosis not present

## 2023-12-25 DIAGNOSIS — K9412 Enterostomy infection: Secondary | ICD-10-CM | POA: Diagnosis not present

## 2023-12-25 DIAGNOSIS — J45909 Unspecified asthma, uncomplicated: Secondary | ICD-10-CM | POA: Diagnosis present

## 2023-12-25 DIAGNOSIS — Y838 Other surgical procedures as the cause of abnormal reaction of the patient, or of later complication, without mention of misadventure at the time of the procedure: Secondary | ICD-10-CM | POA: Diagnosis not present

## 2023-12-25 DIAGNOSIS — N200 Calculus of kidney: Secondary | ICD-10-CM | POA: Diagnosis not present

## 2023-12-25 DIAGNOSIS — L03311 Cellulitis of abdominal wall: Secondary | ICD-10-CM | POA: Diagnosis not present

## 2023-12-25 DIAGNOSIS — K91840 Postprocedural hemorrhage and hematoma of a digestive system organ or structure following a digestive system procedure: Secondary | ICD-10-CM | POA: Diagnosis not present

## 2023-12-25 DIAGNOSIS — G8929 Other chronic pain: Secondary | ICD-10-CM | POA: Diagnosis not present

## 2023-12-25 DIAGNOSIS — R4589 Other symptoms and signs involving emotional state: Secondary | ICD-10-CM

## 2023-12-25 DIAGNOSIS — B3789 Other sites of candidiasis: Secondary | ICD-10-CM | POA: Diagnosis not present

## 2023-12-25 DIAGNOSIS — Y842 Radiological procedure and radiotherapy as the cause of abnormal reaction of the patient, or of later complication, without mention of misadventure at the time of the procedure: Secondary | ICD-10-CM | POA: Diagnosis present

## 2023-12-25 DIAGNOSIS — Z923 Personal history of irradiation: Secondary | ICD-10-CM

## 2023-12-25 DIAGNOSIS — Z87442 Personal history of urinary calculi: Secondary | ICD-10-CM

## 2023-12-25 DIAGNOSIS — Z6824 Body mass index (BMI) 24.0-24.9, adult: Secondary | ICD-10-CM | POA: Diagnosis not present

## 2023-12-25 DIAGNOSIS — T8142XA Infection following a procedure, deep incisional surgical site, initial encounter: Principal | ICD-10-CM | POA: Diagnosis present

## 2023-12-25 DIAGNOSIS — N9989 Other postprocedural complications and disorders of genitourinary system: Secondary | ICD-10-CM | POA: Diagnosis not present

## 2023-12-25 DIAGNOSIS — M4628 Osteomyelitis of vertebra, sacral and sacrococcygeal region: Secondary | ICD-10-CM | POA: Diagnosis not present

## 2023-12-25 DIAGNOSIS — R52 Pain, unspecified: Secondary | ICD-10-CM

## 2023-12-25 DIAGNOSIS — D62 Acute posthemorrhagic anemia: Secondary | ICD-10-CM

## 2023-12-25 DIAGNOSIS — K219 Gastro-esophageal reflux disease without esophagitis: Secondary | ICD-10-CM | POA: Diagnosis present

## 2023-12-25 DIAGNOSIS — Z833 Family history of diabetes mellitus: Secondary | ICD-10-CM

## 2023-12-25 DIAGNOSIS — R58 Hemorrhage, not elsewhere classified: Secondary | ICD-10-CM | POA: Diagnosis not present

## 2023-12-25 DIAGNOSIS — L98498 Non-pressure chronic ulcer of skin of other sites with other specified severity: Secondary | ICD-10-CM | POA: Diagnosis not present

## 2023-12-25 DIAGNOSIS — K8001 Calculus of gallbladder with acute cholecystitis with obstruction: Secondary | ICD-10-CM | POA: Diagnosis not present

## 2023-12-25 DIAGNOSIS — Z86718 Personal history of other venous thrombosis and embolism: Secondary | ICD-10-CM

## 2023-12-25 DIAGNOSIS — Z515 Encounter for palliative care: Secondary | ICD-10-CM | POA: Diagnosis not present

## 2023-12-25 DIAGNOSIS — D638 Anemia in other chronic diseases classified elsewhere: Secondary | ICD-10-CM | POA: Diagnosis present

## 2023-12-25 DIAGNOSIS — R578 Other shock: Secondary | ICD-10-CM | POA: Diagnosis not present

## 2023-12-25 DIAGNOSIS — I10 Essential (primary) hypertension: Secondary | ICD-10-CM | POA: Diagnosis not present

## 2023-12-25 DIAGNOSIS — F32A Depression, unspecified: Secondary | ICD-10-CM | POA: Diagnosis not present

## 2023-12-25 DIAGNOSIS — Z7901 Long term (current) use of anticoagulants: Secondary | ICD-10-CM | POA: Diagnosis not present

## 2023-12-25 DIAGNOSIS — S31501A Unspecified open wound of unspecified external genital organs, male, initial encounter: Secondary | ICD-10-CM | POA: Diagnosis not present

## 2023-12-25 DIAGNOSIS — E119 Type 2 diabetes mellitus without complications: Secondary | ICD-10-CM | POA: Diagnosis not present

## 2023-12-25 DIAGNOSIS — Z8249 Family history of ischemic heart disease and other diseases of the circulatory system: Secondary | ICD-10-CM

## 2023-12-25 DIAGNOSIS — K82A2 Perforation of gallbladder in cholecystitis: Secondary | ICD-10-CM | POA: Diagnosis not present

## 2023-12-25 DIAGNOSIS — F419 Anxiety disorder, unspecified: Secondary | ICD-10-CM | POA: Diagnosis not present

## 2023-12-25 DIAGNOSIS — E559 Vitamin D deficiency, unspecified: Secondary | ICD-10-CM | POA: Diagnosis not present

## 2023-12-25 DIAGNOSIS — E43 Unspecified severe protein-calorie malnutrition: Secondary | ICD-10-CM | POA: Diagnosis not present

## 2023-12-25 DIAGNOSIS — N321 Vesicointestinal fistula: Secondary | ICD-10-CM | POA: Diagnosis not present

## 2023-12-25 DIAGNOSIS — Z5309 Procedure and treatment not carried out because of other contraindication: Secondary | ICD-10-CM | POA: Diagnosis not present

## 2023-12-25 DIAGNOSIS — K828 Other specified diseases of gallbladder: Secondary | ICD-10-CM | POA: Diagnosis not present

## 2023-12-25 DIAGNOSIS — N322 Vesical fistula, not elsewhere classified: Secondary | ICD-10-CM | POA: Diagnosis present

## 2023-12-25 DIAGNOSIS — T8141XA Infection following a procedure, superficial incisional surgical site, initial encounter: Secondary | ICD-10-CM | POA: Diagnosis not present

## 2023-12-25 DIAGNOSIS — Z9221 Personal history of antineoplastic chemotherapy: Secondary | ICD-10-CM

## 2023-12-25 DIAGNOSIS — N9971 Accidental puncture and laceration of a genitourinary system organ or structure during a genitourinary system procedure: Secondary | ICD-10-CM | POA: Diagnosis not present

## 2023-12-25 DIAGNOSIS — T8183XA Persistent postprocedural fistula, initial encounter: Secondary | ICD-10-CM | POA: Diagnosis present

## 2023-12-25 DIAGNOSIS — K625 Hemorrhage of anus and rectum: Secondary | ICD-10-CM | POA: Diagnosis not present

## 2023-12-25 DIAGNOSIS — I9762 Postprocedural hemorrhage of a circulatory system organ or structure following other procedure: Secondary | ICD-10-CM | POA: Diagnosis not present

## 2023-12-25 DIAGNOSIS — S31000A Unspecified open wound of lower back and pelvis without penetration into retroperitoneum, initial encounter: Secondary | ICD-10-CM | POA: Diagnosis not present

## 2023-12-25 DIAGNOSIS — K632 Fistula of intestine: Secondary | ICD-10-CM | POA: Diagnosis not present

## 2023-12-25 DIAGNOSIS — C20 Malignant neoplasm of rectum: Secondary | ICD-10-CM | POA: Diagnosis present

## 2023-12-25 DIAGNOSIS — K651 Peritoneal abscess: Secondary | ICD-10-CM | POA: Diagnosis not present

## 2023-12-25 DIAGNOSIS — R000 Tachycardia, unspecified: Secondary | ICD-10-CM | POA: Diagnosis not present

## 2023-12-25 DIAGNOSIS — Z91041 Radiographic dye allergy status: Secondary | ICD-10-CM

## 2023-12-25 DIAGNOSIS — R1310 Dysphagia, unspecified: Secondary | ICD-10-CM | POA: Diagnosis present

## 2023-12-25 DIAGNOSIS — Z95828 Presence of other vascular implants and grafts: Secondary | ICD-10-CM | POA: Diagnosis not present

## 2023-12-25 DIAGNOSIS — I96 Gangrene, not elsewhere classified: Secondary | ICD-10-CM | POA: Diagnosis not present

## 2023-12-25 DIAGNOSIS — T85628A Displacement of other specified internal prosthetic devices, implants and grafts, initial encounter: Secondary | ICD-10-CM | POA: Diagnosis not present

## 2023-12-25 DIAGNOSIS — N360 Urethral fistula: Secondary | ICD-10-CM | POA: Diagnosis not present

## 2023-12-25 DIAGNOSIS — Z86711 Personal history of pulmonary embolism: Secondary | ICD-10-CM

## 2023-12-25 DIAGNOSIS — R55 Syncope and collapse: Secondary | ICD-10-CM | POA: Diagnosis present

## 2023-12-25 DIAGNOSIS — L988 Other specified disorders of the skin and subcutaneous tissue: Secondary | ICD-10-CM

## 2023-12-25 DIAGNOSIS — R21 Rash and other nonspecific skin eruption: Secondary | ICD-10-CM | POA: Diagnosis not present

## 2023-12-25 DIAGNOSIS — Z9889 Other specified postprocedural states: Principal | ICD-10-CM

## 2023-12-25 DIAGNOSIS — G4709 Other insomnia: Secondary | ICD-10-CM | POA: Diagnosis not present

## 2023-12-25 DIAGNOSIS — L02211 Cutaneous abscess of abdominal wall: Secondary | ICD-10-CM | POA: Diagnosis not present

## 2023-12-25 DIAGNOSIS — Z9049 Acquired absence of other specified parts of digestive tract: Secondary | ICD-10-CM

## 2023-12-25 DIAGNOSIS — K8020 Calculus of gallbladder without cholecystitis without obstruction: Secondary | ICD-10-CM | POA: Diagnosis not present

## 2023-12-25 DIAGNOSIS — Z79891 Long term (current) use of opiate analgesic: Secondary | ICD-10-CM | POA: Diagnosis not present

## 2023-12-25 HISTORY — PX: RECTAL EXAM UNDER ANESTHESIA: SHX6399

## 2023-12-25 LAB — PROTIME-INR
INR: 2.8 — ABNORMAL HIGH (ref 0.8–1.2)
Prothrombin Time: 29.6 s — ABNORMAL HIGH (ref 11.4–15.2)

## 2023-12-25 LAB — CBC WITH DIFFERENTIAL/PLATELET
Abs Immature Granulocytes: 0.21 10*3/uL — ABNORMAL HIGH (ref 0.00–0.07)
Basophils Absolute: 0 10*3/uL (ref 0.0–0.1)
Basophils Relative: 0 %
Eosinophils Absolute: 0.1 10*3/uL (ref 0.0–0.5)
Eosinophils Relative: 0 %
HCT: 20.5 % — ABNORMAL LOW (ref 39.0–52.0)
Hemoglobin: 5.8 g/dL — CL (ref 13.0–17.0)
Immature Granulocytes: 1 %
Lymphocytes Relative: 10 %
Lymphs Abs: 1.8 10*3/uL (ref 0.7–4.0)
MCH: 19.8 pg — ABNORMAL LOW (ref 26.0–34.0)
MCHC: 28.3 g/dL — ABNORMAL LOW (ref 30.0–36.0)
MCV: 70 fL — ABNORMAL LOW (ref 80.0–100.0)
Monocytes Absolute: 1.5 10*3/uL — ABNORMAL HIGH (ref 0.1–1.0)
Monocytes Relative: 8 %
Neutro Abs: 14.5 10*3/uL — ABNORMAL HIGH (ref 1.7–7.7)
Neutrophils Relative %: 81 %
Platelets: 664 10*3/uL — ABNORMAL HIGH (ref 150–400)
RBC: 2.93 MIL/uL — ABNORMAL LOW (ref 4.22–5.81)
RDW: 18.6 % — ABNORMAL HIGH (ref 11.5–15.5)
Smear Review: NORMAL
WBC: 18.1 10*3/uL — ABNORMAL HIGH (ref 4.0–10.5)
nRBC: 0.2 % (ref 0.0–0.2)

## 2023-12-25 LAB — COMPREHENSIVE METABOLIC PANEL WITH GFR
ALT: 10 U/L (ref 0–44)
AST: 12 U/L — ABNORMAL LOW (ref 15–41)
Albumin: 1.7 g/dL — ABNORMAL LOW (ref 3.5–5.0)
Alkaline Phosphatase: 88 U/L (ref 38–126)
Anion gap: 8 (ref 5–15)
BUN: 27 mg/dL — ABNORMAL HIGH (ref 6–20)
CO2: 21 mmol/L — ABNORMAL LOW (ref 22–32)
Calcium: 8.2 mg/dL — ABNORMAL LOW (ref 8.9–10.3)
Chloride: 106 mmol/L (ref 98–111)
Creatinine, Ser: 0.97 mg/dL (ref 0.61–1.24)
GFR, Estimated: >60 mL/min (ref >60)
Glucose, Bld: 328 mg/dL — ABNORMAL HIGH (ref 70–99)
Potassium: 4.6 mmol/L (ref 3.5–5.1)
Sodium: 135 mmol/L (ref 135–145)
Total Bilirubin: 0.2 mg/dL (ref 0.0–1.2)
Total Protein: 5.9 g/dL — ABNORMAL LOW (ref 6.5–8.1)

## 2023-12-25 LAB — GLUCOSE, CAPILLARY
Glucose-Capillary: 240 mg/dL — ABNORMAL HIGH (ref 70–99)
Glucose-Capillary: 189 mg/dL — ABNORMAL HIGH (ref 70–99)
Glucose-Capillary: 150 mg/dL — ABNORMAL HIGH (ref 70–99)
Glucose-Capillary: 137 mg/dL — ABNORMAL HIGH (ref 70–99)

## 2023-12-25 LAB — HEMOGLOBIN AND HEMATOCRIT, BLOOD
HCT: 20.8 % — ABNORMAL LOW (ref 39.0–52.0)
Hemoglobin: 6.3 g/dL — CL (ref 13.0–17.0)

## 2023-12-25 LAB — CBG MONITORING, ED: Glucose-Capillary: 260 mg/dL — ABNORMAL HIGH (ref 70–99)

## 2023-12-25 LAB — MRSA NEXT GEN BY PCR, NASAL: MRSA by PCR Next Gen: NOT DETECTED

## 2023-12-25 LAB — PREPARE RBC (CROSSMATCH)

## 2023-12-25 LAB — APTT: aPTT: 57 s — ABNORMAL HIGH (ref 24–36)

## 2023-12-25 SURGERY — EXAM UNDER ANESTHESIA, RECTUM
Anesthesia: General

## 2023-12-25 MED ORDER — ALBUMIN HUMAN 5 % IV SOLN: INTRAVENOUS | Status: DC | PRN

## 2023-12-25 MED ORDER — ORAL CARE MOUTH RINSE: 15.0000 mL | OROMUCOSAL | Status: DC | PRN

## 2023-12-25 MED ORDER — MIDAZOLAM HCL 2 MG/2ML IJ SOLN
INTRAMUSCULAR | Status: AC
Start: 2023-12-25 — End: ?
  Filled 2023-12-25: qty 2

## 2023-12-25 MED ORDER — HYDROMORPHONE HCL 1 MG/ML IJ SOLN
0.2500 mg | INTRAMUSCULAR | Status: DC | PRN
  Administered 2023-12-25: 0.5 mg via INTRAVENOUS

## 2023-12-25 MED ORDER — BUPIVACAINE LIPOSOME 1.3 % IJ SUSP
INTRAMUSCULAR | Status: AC
  Filled 2023-12-25: qty 20

## 2023-12-25 MED ORDER — ONDANSETRON HCL 4 MG/2ML IJ SOLN: 4.0000 mg | Freq: Once | INTRAMUSCULAR | Status: DC | PRN

## 2023-12-25 MED ORDER — ONDANSETRON HCL 4 MG/2ML IJ SOLN
INTRAMUSCULAR | Status: DC | PRN
Start: 2023-12-25 — End: 2023-12-25
  Administered 2023-12-25: 4 mg via INTRAVENOUS

## 2023-12-25 MED ORDER — PROPOFOL 10 MG/ML IV BOLUS
INTRAVENOUS | Status: DC | PRN
  Administered 2023-12-25: 160 mg via INTRAVENOUS

## 2023-12-25 MED ORDER — ORAL CARE MOUTH RINSE: 15.0000 mL | Freq: Once | OROMUCOSAL | Status: AC

## 2023-12-25 MED ORDER — NAPHAZOLINE-GLYCERIN 0.012-0.25 % OP SOLN: 1.0000 [drp] | Freq: Four times a day (QID) | OPHTHALMIC | Status: DC | PRN

## 2023-12-25 MED ORDER — INSULIN ASPART 100 UNIT/ML IJ SOLN
0.0000 [IU] | INTRAMUSCULAR | Status: DC | PRN
  Administered 2023-12-25: 6 [IU] via SUBCUTANEOUS
  Filled 2023-12-25: qty 1

## 2023-12-25 MED ORDER — SODIUM CHLORIDE 0.9 % IV BOLUS
1000.0000 mL | Freq: Once | INTRAVENOUS | Status: AC
  Administered 2023-12-25: 1000 mL via INTRAVENOUS

## 2023-12-25 MED ORDER — SODIUM CHLORIDE 0.9% IV SOLUTION: Freq: Once | INTRAVENOUS | Status: DC

## 2023-12-25 MED ORDER — ALBUTEROL SULFATE (2.5 MG/3ML) 0.083% IN NEBU
2.5000 mg | INHALATION_SOLUTION | Freq: Four times a day (QID) | RESPIRATORY_TRACT | Status: DC | PRN
  Filled 2023-12-25: qty 3

## 2023-12-25 MED ORDER — DIPHENHYDRAMINE HCL 25 MG PO CAPS
25.0000 mg | ORAL_CAPSULE | Freq: Four times a day (QID) | ORAL | Status: DC | PRN
  Administered 2023-12-29 – 2024-01-10 (×4): 25 mg via ORAL
  Filled 2023-12-25 (×5): qty 1

## 2023-12-25 MED ORDER — PHENYLEPHRINE HCL-NACL 20-0.9 MG/250ML-% IV SOLN
INTRAVENOUS | Status: AC
  Filled 2023-12-25: qty 250

## 2023-12-25 MED ORDER — INSULIN ASPART 100 UNIT/ML IJ SOLN
0.0000 [IU] | Freq: Three times a day (TID) | INTRAMUSCULAR | Status: DC
  Administered 2023-12-25: 2 [IU] via SUBCUTANEOUS
  Administered 2023-12-25: 8 [IU] via SUBCUTANEOUS
  Administered 2023-12-26: 3 [IU] via SUBCUTANEOUS
  Administered 2023-12-26: 8 [IU] via SUBCUTANEOUS
  Administered 2023-12-26 – 2023-12-27 (×2): 5 [IU] via SUBCUTANEOUS
  Administered 2023-12-27: 2 [IU] via SUBCUTANEOUS
  Administered 2023-12-27: 3 [IU] via SUBCUTANEOUS
  Administered 2023-12-28 (×3): 5 [IU] via SUBCUTANEOUS
  Administered 2023-12-29: 3 [IU] via SUBCUTANEOUS
  Administered 2023-12-29: 5 [IU] via SUBCUTANEOUS
  Administered 2023-12-29: 8 [IU] via SUBCUTANEOUS
  Administered 2023-12-30: 5 [IU] via SUBCUTANEOUS
  Filled 2023-12-25: qty 0.15

## 2023-12-25 MED ORDER — LIDOCAINE HCL (PF) 2 % IJ SOLN
INTRAMUSCULAR | Status: AC
  Filled 2023-12-25: qty 5

## 2023-12-25 MED ORDER — CHLORHEXIDINE GLUCONATE 0.12 % MT SOLN
15.0000 mL | Freq: Once | OROMUCOSAL | Status: AC
  Administered 2023-12-25: 15 mL via OROMUCOSAL
  Filled 2023-12-25: qty 15

## 2023-12-25 MED ORDER — ACETAMINOPHEN 10 MG/ML IV SOLN
INTRAVENOUS | Status: AC
Start: 2023-12-25 — End: 2023-12-25
  Filled 2023-12-25: qty 100

## 2023-12-25 MED ORDER — FENTANYL CITRATE (PF) 100 MCG/2ML IJ SOLN
INTRAMUSCULAR | Status: DC | PRN
  Administered 2023-12-25: 50 ug via INTRAVENOUS
  Administered 2023-12-25 (×3): 25 ug via INTRAVENOUS
  Administered 2023-12-25: 50 ug via INTRAVENOUS
  Administered 2023-12-25: 25 ug via INTRAVENOUS

## 2023-12-25 MED ORDER — FENTANYL CITRATE (PF) 100 MCG/2ML IJ SOLN
INTRAMUSCULAR | Status: AC
  Filled 2023-12-25: qty 2

## 2023-12-25 MED ORDER — PROTHROMBIN COMPLEX CONC HUMAN 500 UNITS IV KIT
50.0000 [IU]/kg | PACK | Status: DC
  Filled 2023-12-25: qty 4265

## 2023-12-25 MED ORDER — HYDROMORPHONE HCL 1 MG/ML IJ SOLN
INTRAMUSCULAR | Status: AC
  Filled 2023-12-25: qty 1

## 2023-12-25 MED ORDER — LACTATED RINGERS IV SOLN: INTRAVENOUS | Status: DC | PRN

## 2023-12-25 MED ORDER — INSULIN ASPART 100 UNIT/ML IJ SOLN
0.0000 [IU] | Freq: Every day | INTRAMUSCULAR | Status: DC
  Administered 2023-12-26: 2 [IU] via SUBCUTANEOUS
  Administered 2023-12-27: 3 [IU] via SUBCUTANEOUS
  Administered 2023-12-28: 2 [IU] via SUBCUTANEOUS
  Administered 2023-12-29: 4 [IU] via SUBCUTANEOUS
  Filled 2023-12-25: qty 0.05

## 2023-12-25 MED ORDER — HYDROMORPHONE HCL 1 MG/ML IJ SOLN
0.5000 mg | INTRAMUSCULAR | Status: DC | PRN
  Administered 2023-12-26 (×2): 0.5 mg via INTRAVENOUS
  Filled 2023-12-25 (×2): qty 1

## 2023-12-25 MED ORDER — ALPRAZOLAM 0.5 MG PO TABS
0.5000 mg | ORAL_TABLET | Freq: Every day | ORAL | Status: DC | PRN
  Administered 2023-12-28 – 2024-01-04 (×8): 0.5 mg via ORAL
  Filled 2023-12-25 (×8): qty 1

## 2023-12-25 MED ORDER — ONDANSETRON HCL 4 MG/2ML IJ SOLN
INTRAMUSCULAR | Status: AC
  Filled 2023-12-25: qty 2

## 2023-12-25 MED ORDER — CALCIUM CARBONATE ANTACID 500 MG PO CHEW
2.0000 | CHEWABLE_TABLET | Freq: Three times a day (TID) | ORAL | Status: DC | PRN
  Administered 2024-01-07 – 2024-01-13 (×5): 400 mg via ORAL
  Filled 2023-12-25 (×5): qty 2

## 2023-12-25 MED ORDER — ONDANSETRON 4 MG PO TBDP: 4.0000 mg | ORAL_TABLET | Freq: Four times a day (QID) | ORAL | Status: DC | PRN

## 2023-12-25 MED ORDER — DOCUSATE SODIUM 100 MG PO CAPS
100.0000 mg | ORAL_CAPSULE | Freq: Two times a day (BID) | ORAL | Status: DC
  Administered 2023-12-25 – 2023-12-28 (×7): 100 mg via ORAL
  Filled 2023-12-25 (×9): qty 1

## 2023-12-25 MED ORDER — BUPIVACAINE-EPINEPHRINE (PF) 0.25% -1:200000 IJ SOLN
INTRAMUSCULAR | Status: AC
  Filled 2023-12-25: qty 30

## 2023-12-25 MED ORDER — PROTHROMBIN COMPLEX CONC HUMAN 500 UNITS IV KIT
4231.0000 [IU] | PACK | Status: AC
  Administered 2023-12-25: 4231 [IU] via INTRAVENOUS
  Filled 2023-12-25: qty 4231

## 2023-12-25 MED ORDER — HYDROMORPHONE HCL 2 MG/ML IJ SOLN
INTRAMUSCULAR | Status: AC
  Filled 2023-12-25: qty 1

## 2023-12-25 MED ORDER — ALBUMIN HUMAN 5 % IV SOLN
INTRAVENOUS | Status: AC
Start: 2023-12-25 — End: ?
  Filled 2023-12-25: qty 250

## 2023-12-25 MED ORDER — PIPERACILLIN-TAZOBACTAM 3.375 G IVPB
3.3750 g | Freq: Three times a day (TID) | INTRAVENOUS | Status: DC
  Administered 2023-12-25 – 2024-01-04 (×29): 3.375 g via INTRAVENOUS
  Filled 2023-12-25 (×29): qty 50

## 2023-12-25 MED ORDER — EMPTY CONTAINERS FLEXIBLE MISC: 900.0000 mg | Freq: Once | Status: DC

## 2023-12-25 MED ORDER — AMISULPRIDE (ANTIEMETIC) 5 MG/2ML IV SOLN: 10.0000 mg | Freq: Once | INTRAVENOUS | Status: DC | PRN

## 2023-12-25 MED ORDER — DIPHENHYDRAMINE HCL 50 MG/ML IJ SOLN: 25.0000 mg | Freq: Four times a day (QID) | INTRAMUSCULAR | Status: DC | PRN

## 2023-12-25 MED ORDER — CHLORHEXIDINE GLUCONATE CLOTH 2 % EX PADS
6.0000 | MEDICATED_PAD | Freq: Every day | CUTANEOUS | Status: DC
  Administered 2023-12-25 – 2023-12-28 (×4): 6 via TOPICAL

## 2023-12-25 MED ORDER — 0.9 % SODIUM CHLORIDE (POUR BTL) OPTIME
TOPICAL | Status: DC | PRN
Start: 2023-12-25 — End: 2023-12-25
  Administered 2023-12-25: 1000 mL

## 2023-12-25 MED ORDER — ONDANSETRON HCL 4 MG/2ML IJ SOLN
4.0000 mg | Freq: Four times a day (QID) | INTRAMUSCULAR | Status: DC | PRN
  Administered 2023-12-30 – 2024-01-07 (×3): 4 mg via INTRAVENOUS
  Filled 2023-12-25: qty 2

## 2023-12-25 MED ORDER — MIDAZOLAM HCL 2 MG/2ML IJ SOLN
INTRAMUSCULAR | Status: DC | PRN
  Administered 2023-12-25: 2 mg via INTRAVENOUS

## 2023-12-25 MED ORDER — PHENYLEPHRINE 80 MCG/ML (10ML) SYRINGE FOR IV PUSH (FOR BLOOD PRESSURE SUPPORT)
PREFILLED_SYRINGE | INTRAVENOUS | Status: AC
  Filled 2023-12-25: qty 10

## 2023-12-25 MED ORDER — LACTATED RINGERS IV SOLN: INTRAVENOUS | Status: DC

## 2023-12-25 MED ORDER — PROPOFOL 10 MG/ML IV BOLUS
INTRAVENOUS | Status: AC
Start: 2023-12-25 — End: ?
  Filled 2023-12-25: qty 20

## 2023-12-25 MED ORDER — HYDROMORPHONE HCL 1 MG/ML IJ SOLN
INTRAMUSCULAR | Status: DC | PRN
  Administered 2023-12-25: .5 mg via INTRAVENOUS

## 2023-12-25 MED ORDER — LIDOCAINE HCL (PF) 2 % IJ SOLN
INTRAMUSCULAR | Status: DC | PRN
  Administered 2023-12-25: 100 mg via INTRADERMAL

## 2023-12-25 MED ORDER — ACETAMINOPHEN 500 MG PO TABS
1000.0000 mg | ORAL_TABLET | Freq: Four times a day (QID) | ORAL | Status: DC | PRN
  Administered 2024-01-04: 1000 mg via ORAL
  Filled 2023-12-25: qty 2

## 2023-12-25 MED ORDER — PHENYLEPHRINE HCL (PRESSORS) 10 MG/ML IV SOLN
INTRAVENOUS | Status: DC | PRN
  Administered 2023-12-25: 80 ug via INTRAVENOUS
  Administered 2023-12-25: 160 ug via INTRAVENOUS

## 2023-12-25 MED ORDER — ACETAMINOPHEN 10 MG/ML IV SOLN
INTRAVENOUS | Status: DC | PRN
  Administered 2023-12-25: 1000 mg via INTRAVENOUS

## 2023-12-25 MED ORDER — LACTATED RINGERS IV SOLN
INTRAVENOUS | Status: AC
Start: 2023-12-25 — End: 2023-12-26

## 2023-12-25 MED ORDER — VASOPRESSIN 20 UNIT/ML IV SOLN
INTRAVENOUS | Status: AC
Start: 2023-12-25 — End: ?
  Filled 2023-12-25: qty 1

## 2023-12-25 MED ORDER — HYDROCODONE-ACETAMINOPHEN 5-325 MG PO TABS
1.0000 | ORAL_TABLET | ORAL | Status: DC | PRN
  Administered 2023-12-25: 1 via ORAL
  Administered 2023-12-26 – 2023-12-27 (×6): 2 via ORAL
  Administered 2023-12-28 (×4): 1 via ORAL
  Administered 2023-12-28 – 2023-12-31 (×14): 2 via ORAL
  Administered 2023-12-31: 1 via ORAL
  Administered 2024-01-01 – 2024-01-03 (×5): 2 via ORAL
  Administered 2024-01-03 (×2): 1 via ORAL
  Administered 2024-01-04 – 2024-01-05 (×4): 2 via ORAL
  Filled 2023-12-25 (×3): qty 2
  Filled 2023-12-25: qty 1
  Filled 2023-12-25: qty 2
  Filled 2023-12-25: qty 1
  Filled 2023-12-25 (×7): qty 2
  Filled 2023-12-25: qty 1
  Filled 2023-12-25 (×4): qty 2
  Filled 2023-12-25: qty 1
  Filled 2023-12-25 (×5): qty 2
  Filled 2023-12-25: qty 1
  Filled 2023-12-25 (×5): qty 2
  Filled 2023-12-25: qty 1
  Filled 2023-12-25 (×6): qty 2
  Filled 2023-12-25 (×2): qty 1

## 2023-12-25 SURGICAL SUPPLY — 33 items
APPLICATOR APC MALEABLE L100 (MISCELLANEOUS) IMPLANT
BAG COUNTER SPONGE SURGICOUNT (BAG) IMPLANT
BLADE HEX COATED 2.75 (ELECTRODE) ×1 IMPLANT
BLADE SURG 15 STRL LF DISP TIS (BLADE) ×1 IMPLANT
BNDG GAUZE DERMACEA FLUFF 4 (GAUZE/BANDAGES/DRESSINGS) IMPLANT
DRAPE SHEET LG 3/4 BI-LAMINATE (DRAPES) ×1 IMPLANT
ELECT REM PT RETURN 15FT ADLT (MISCELLANEOUS) ×1 IMPLANT
GAUZE 4X4 16PLY ~~LOC~~+RFID DBL (SPONGE) ×1 IMPLANT
GAUZE PAD ABD 8X10 STRL (GAUZE/BANDAGES/DRESSINGS) IMPLANT
GAUZE SPONGE 4X4 12PLY STRL (GAUZE/BANDAGES/DRESSINGS) IMPLANT
GLOVE BIOGEL PI IND STRL 7.0 (GLOVE) ×1 IMPLANT
GLOVE INDICATOR 8.0 STRL GRN (GLOVE) ×2 IMPLANT
GLOVE SS BIOGEL STRL SZ 7.5 (GLOVE) ×1 IMPLANT
GOWN STRL REUS W/ TWL XL LVL3 (GOWN DISPOSABLE) ×2 IMPLANT
HEMOSTAT SNOW SURGICEL 2X4 (HEMOSTASIS) IMPLANT
KIT BASIN OR (CUSTOM PROCEDURE TRAY) ×1 IMPLANT
KIT TURNOVER KIT A (KITS) IMPLANT
NDL HYPO 25X1 1.5 SAFETY (NEEDLE) ×1 IMPLANT
NDL SAFETY ECLIPSE 18X1.5 (NEEDLE) IMPLANT
NEEDLE HYPO 25X1 1.5 SAFETY (NEEDLE) ×1 IMPLANT
NS IRRIG 1000ML POUR BTL (IV SOLUTION) ×1 IMPLANT
PACK LITHOTOMY IV (CUSTOM PROCEDURE TRAY) ×1 IMPLANT
PENCIL SMOKE EVACUATOR (MISCELLANEOUS) IMPLANT
SPIKE FLUID TRANSFER (MISCELLANEOUS) ×1 IMPLANT
SPONGE SURGIFOAM ABS GEL 12-7 (HEMOSTASIS) ×3 IMPLANT
SURGIFLO W/THROMBIN 8M KIT (HEMOSTASIS) IMPLANT
SURGILUBE 2OZ TUBE FLIPTOP (MISCELLANEOUS) ×1 IMPLANT
SUT CHROMIC 2 0 SH (SUTURE) IMPLANT
SUT CHROMIC 3 0 SH 27 (SUTURE) IMPLANT
SUT MON AB 3-0 SH27 (SUTURE) IMPLANT
SUT VIC AB 4-0 P2 18 (SUTURE) ×1 IMPLANT
SYR CONTROL 10ML LL (SYRINGE) ×1 IMPLANT
TOWEL OR 17X26 10 PK STRL BLUE (TOWEL DISPOSABLE) ×1 IMPLANT

## 2023-12-25 NOTE — ED Triage Notes (Signed)
 Pt BIB EMS coming from home. Patient had his rectum removed last week. Woke up today with bleeding. States not feeling dizzy or nop LOC. On Eliquis. Having pain to left buttock 5/10. Upload arrival patient bleed through sheets and undergarment. Provider at bedside.

## 2023-12-25 NOTE — ED Notes (Signed)
Pt transported to the OR. 

## 2023-12-25 NOTE — Anesthesia Postprocedure Evaluation (Signed)
 Anesthesia Post Note  Patient: Steven [name]  Procedure(s) Performed: EXAM UNDER ANESTHESIA, RECTUM     Patient location during evaluation: PACU Anesthesia Type: General Level of consciousness: awake Pain management: pain level controlled Vital Signs Assessment: post-procedure vital signs reviewed and stable Respiratory status: spontaneous breathing, nonlabored ventilation and respiratory function stable Cardiovascular status: blood pressure returned to baseline and stable Postop Assessment: no apparent nausea or vomiting Anesthetic complications: no   No notable events documented.  Last Vitals:  Vitals:   12/25/23 1500 12/25/23 1517  BP: (!) 103/54 (!) 85/40  Pulse: 98 99  Resp: 14 12  Temp:    SpO2: 97% 100%    Last Pain:  Vitals:   12/25/23 1517  TempSrc:   PainSc: Asleep                 Linton Rump

## 2023-12-25 NOTE — Anesthesia Preprocedure Evaluation (Addendum)
 Anesthesia Evaluation  Patient identified by MRN, date of birth, ID band Patient awake    Reviewed: Allergy & Precautions, NPO status , Patient's Chart, lab work & pertinent test results  History of Anesthesia Complications Negative for: history of anesthetic complications  Airway Mallampati: III  TM Distance: >3 FB Neck ROM: Full   Comment: Previous grade I view with MAC 3, easy mask Dental  (+) Dental Advisory Given   Pulmonary neg shortness of breath, asthma , sleep apnea , neg COPD, neg recent URI, PE   Pulmonary exam normal breath sounds clear to auscultation       Cardiovascular hypertension, (-) angina + Peripheral Vascular Disease  (-) Past MI, (-) Cardiac Stents and (-) CABG (-) dysrhythmias  Rhythm:Regular Rate:Normal  HLD  TTE 07/06/2020: IMPRESSIONS    1. Left ventricular ejection fraction, by estimation, is 55 to 60%. The  left ventricle has normal function. The left ventricle has no regional  wall motion abnormalities. Left ventricular diastolic parameters are  consistent with Grade I diastolic  dysfunction (impaired relaxation).   2. Right ventricular systolic function is mildly reduced. The right  ventricular size is mildly enlarged. There is mildly elevated pulmonary  artery systolic pressure.   3. The mitral valve is normal in structure. No evidence of mitral valve  regurgitation. No evidence of mitral stenosis.   4. The aortic valve is grossly normal. Aortic valve regurgitation is not  visualized. No aortic stenosis is present.   5. Aortic dilatation noted. There is borderline dilatation of the  ascending aorta, measuring 38 mm.   6. The inferior vena cava is dilated in size with <50% respiratory  variability, suggesting right atrial pressure of 15 mmHg.     Neuro/Psych neg Seizures PSYCHIATRIC DISORDERS Anxiety Depression     Neuromuscular disease (peripheral neuropathy)    GI/Hepatic Neg liver  ROS,GERD  ,,Rectal cancer, diverting ileostomy   Endo/Other  diabetes (Hgb A1c 10.3), Poorly Controlled, Type 2, Oral Hypoglycemic Agents    Renal/GU Renal disease     Musculoskeletal   Abdominal   Peds  Hematology  (+) Blood dyscrasia, anemia Lab Results      Component                Value               Date                      WBC                      18.1 (H)            12/25/2023                HGB                      5.8 (LL)            12/25/2023                HCT                      20.5 (L)            12/25/2023                MCV                      70.0 (  L)            12/25/2023                PLT                      664 (H)             12/25/2023              Anesthesia Other Findings 53 y.o. male with medical history significant for diabetes, VTE on Xarelto, stage II adenocarcinoma status post neoadjuvant concurrent chemoradiation 2022 followed by low anterior resection and complicated postoperative course with chronic pelvic abscess now being admitted to the hospital by the surgical service due to acute blood loss anemia from perirectal wound.    Last Xarelto:  Last Farxiga:  Reproductive/Obstetrics                             Anesthesia Physical Anesthesia Plan  ASA: 4  Anesthesia Plan: General   Post-op Pain Management: Ofirmev IV (intra-op)*   Induction: Intravenous  PONV Risk Score and Plan: 2 and Ondansetron, Dexamethasone and Treatment may vary due to age or medical condition  Airway Management Planned: LMA  Additional Equipment:   Intra-op Plan:   Post-operative Plan: Extubation in OR  Informed Consent: I have reviewed the patients History and Physical, chart, labs and discussed the procedure including the risks, benefits and alternatives for the proposed anesthesia with the patient or authorized representative who has indicated his/her understanding and acceptance.     Dental advisory given  Plan Discussed  with: Anesthesiologist and CRNA  Anesthesia Plan Comments: (Risks of general anesthesia discussed including, but not limited to, sore throat, hoarse voice, chipped/damaged teeth, injury to vocal cords, nausea and vomiting, allergic reactions, lung infection, heart attack, stroke, and death. All questions answered. )        Anesthesia Quick Evaluation

## 2023-12-25 NOTE — Consult Note (Signed)
 [facility] Initial Consultation Note  Steven [name] WUJ:[id number] DOB: [birth year] DOA: 12/25/2023  PCP: Noberto Retort, MD   Requesting Physician: Hosie Spangle, PA   Reason for Consultation: Medical Management  HPI: Steven [name] is a 53 y.o. male with medical history significant for diabetes, VTE on Xarelto, stage II adenocarcinoma status post neoadjuvant concurrent chemoradiation 2022 followed by low anterior resection and complicated postoperative course with chronic pelvic abscess now being admitted to the hospital by the surgical service due to acute blood loss anemia from perirectal wound.  Hospitalist consultation has been requested to assist with medical management, primarily of his diabetes.  He was recently admitted overnight by the surgical service and discharged home on 12/17/2023 after surgical debridement of his abdominal complete prostatectomy wound.  He has continued to take Xarelto for his history of VTE, last dose last evening 3/27.  This morning, he started having bright red blood from his rectal wound.  Workup as detailed below shows severe acute blood loss anemia.  Wife is at the bedside and denies any fevers, chills, chest pain, syncope or other complaints.  Patient has been admitted to the hospital by surgical service, 2 units of blood are being transfused currently and hospitalist consultation was requested for medical management.   Review of Systems: Please see HPI for pertinent positives and negatives. A complete 10 system review of systems are otherwise negative.  Past Medical History:  Diagnosis Date   Anemia 02/2021   iron   Anxiety    Asthma    Bilateral pulmonary embolism (HCC) 04/24/2019   Dehiscence of anastomosis of large intestine 12/31/2022   Depression    Diabetes mellitus without complication (HCC)    History of kidney stones    HLD (hyperlipidemia)    Hypertension    Kidney stones    Neuromuscular disorder (HCC)    from radiation  bi lat   OSA (obstructive sleep apnea)    CPAP   Peripheral vascular disease (HCC)    DVT   rectal ca 02/2021   Sleep apnea    Past Surgical History:  Procedure Laterality Date   APPENDECTOMY  1989   open appy   COLONOSCOPY  03/05/2021   CYST EXCISION  2011   left neck   DIVERTING ILEOSTOMY N/A 07/17/2021   Procedure: DIVERTING ILEOSTOMY;  Surgeon: Romie Levee, MD;  Location: [facility];  Service: General;  Laterality: N/A;   FLEXIBLE SIGMOIDOSCOPY N/A 04/04/2022   Procedure: FLEXIBLE SIGMOIDOSCOPY WITH POSSIBLE DEBRIDEMENT;  Surgeon: Romie Levee, MD;  Location: [facility];  Service: General;  Laterality: N/A;   INCISION AND DRAINAGE PERIRECTAL ABSCESS N/A 12/16/2023   Procedure: DEBRIDEMENT OF POST OP WOUND;  Surgeon: Romie Levee, MD;  Location: [facility];  Service: General;  Laterality: N/A;  I & D PERIONEAL WOUND   RECTAL EXAM UNDER ANESTHESIA N/A 04/04/2022   Procedure: ANAL EXAM UNDER ANESTHESIA;  Surgeon: Romie Levee, MD;  Location: [facility];  Service: General;  Laterality: N/A;   RECTOPEXY N/A 12/02/2023   Procedure: Perineal Proctatectomy;  Surgeon: Romie Levee, MD;  Location: [facility];  Service: General;  Laterality: N/A;  Completion Proctatectomy   WISDOM TOOTH EXTRACTION     age 4   XI ROBOTIC ASSISTED LOWER ANTERIOR RESECTION N/A 07/17/2021   Procedure: XI ROBOTIC ASSISTED LOWER ANTERIOR RESECTION;  Surgeon: Romie Levee, MD;  Location: [facility];  Service: General;  Laterality: N/A;   XI ROBOTIC ASSISTED LOWER ANTERIOR RESECTION N/A 12/31/2022   Procedure:  ROBOTIC ASSISTED LOWER ANTERIOR RESECTION WITH OSTOMY with intraop ICG guided perfusion, takedown of iliostomy;  Surgeon: Romie Levee, MD;  Location: [facility];  Service: General;  Laterality: N/A;    Social History:  reports that he has never smoked. He has never used smokeless tobacco. He reports current alcohol use. He reports that he does not use drugs.  Allergies  Allergen  Reactions   Contrast Media [Iodinated Contrast Media] Hives and Nausea Only    As a 53 year old experienced hives and nausea    Family History  Problem Relation Age of Onset   Diabetes Mellitus II Mother    Breast cancer Mother 86   Thrombophlebitis Father    Breast cancer Maternal Grandmother 46   Diabetes Maternal Grandmother    Breast cancer Maternal Aunt 60   Diabetes Maternal Aunt    Hypertension Paternal Grandmother    Colon cancer Neg Hx    Esophageal cancer Neg Hx    Inflammatory bowel disease Neg Hx    Liver disease Neg Hx    Pancreatic cancer Neg Hx    Rectal cancer Neg Hx    Stomach cancer Neg Hx      Prior to Admission medications   Medication Sig Start Date End Date Taking? Authorizing Provider  acetaminophen (TYLENOL) 500 MG tablet Take 1,000 mg by mouth every 6 (six) hours as needed for mild pain.    [provider]  albuterol (PROAIR HFA) 108 (90 Base) MCG/ACT inhaler Inhale 2 puffs into the lungs every 4 (four) hours as needed for wheezing or shortness of breath. 08/30/10   [provider]  ALPRAZolam Prudy Feeler) 0.5 MG tablet Take 0.5 mg by mouth daily as needed for anxiety.    [provider]  calcium carbonate (TUMS - DOSED IN MG ELEMENTAL CALCIUM) 500 MG chewable tablet Chew 2 tablets by mouth daily as needed for indigestion or heartburn.    [provider]  Dapagliflozin Pro-metFORMIN ER (XIGDUO XR) 06-999 MG TB24 Take 1 tablet by mouth daily.    [provider]  fenofibrate 160 MG tablet Take 160 mg by mouth daily. 02/23/19   [provider]  Ferrous Sulfate (IRON PO) Take 1 tablet by mouth every evening. Patient not taking: Reported on 12/15/2023    [provider]  Gauze Pads & Dressings (COMBINE ABD) 5"X9" PADS 1 Units by Does not apply route daily. 12/17/23   Romie Levee, MD  Gauze Pads & Dressings [facility] GAUZE ROLL LARGE) MISC 1 Units by Does not apply route daily. 12/17/23   Romie Levee, MD   HYDROcodone-acetaminophen (NORCO/VICODIN) 5-325 MG tablet Take 1-2 tablets by mouth every 6 (six) hours as needed for moderate pain (pain score 4-6). 12/17/23   Romie Levee, MD  ibuprofen (ADVIL) 200 MG tablet Take 600 mg by mouth every 6 (six) hours as needed for moderate pain.    [provider]  methocarbamol (ROBAXIN) 500 MG tablet Take 2 tablets (1,000 mg total) by mouth every 8 (eight) hours as needed for muscle spasms. 12/17/23   Romie Levee, MD  naproxen sodium (ALEVE) 220 MG tablet Take 220 mg by mouth 2 (two) times daily as needed (pain).    [provider]  rivaroxaban (XARELTO) 20 MG TABS tablet Take 1 tablet (20 mg total) by mouth daily with supper. 04/13/23   Rachel Moulds, MD  tetrahydrozoline-zinc (VISINE-AC) 0.05-0.25 % ophthalmic solution Place 2 drops into both eyes 4 (four) times daily as needed (allergy).    [provider]    Physical Exam: BP (!) 93/44   Pulse (!) 107   Temp 98.1 F (36.7 C) (Oral)   Resp (!) 22   Ht 5\' 10"  (1.778 m)   Wt 85.3 kg   SpO2 95%   BMI 26.98 kg/m  General:  Alert, oriented, calm, looks very pale Cardiovascular: RRR, no murmurs or rubs, no peripheral edema  Respiratory: clear to auscultation bilaterally, no wheezes, no crackles  Skin: dry, no rashes  Musculoskeletal: no joint effusions, normal range of motion  Psychiatric: appropriate affect, normal speech           Recent Labs and Imaging Reviewed:  Basic Metabolic Panel: No results for input(s): "NA", "K", "CL", "CO2", "GLUCOSE", "BUN", "CREATININE", "CALCIUM", "MG", "PHOS" in the last 168 hours. Liver Function Tests: No results for input(s): "AST", "ALT", "ALKPHOS", "BILITOT", "PROT", "ALBUMIN" in the last 168 hours. No results for input(s): "LIPASE", "AMYLASE" in the last 168 hours. No results for input(s): "AMMONIA" in the last 168 hours. CBC: Recent Labs  Lab 12/25/23 0758  WBC 18.1*  NEUTROABS 14.5*  HGB 5.8*  HCT 20.5*  MCV 70.0*   PLT 664*   Cardiac Enzymes: No results for input(s): "CKTOTAL", "CKMB", "CKMBINDEX", "TROPONINI" in the last 168 hours.  BNP (last 3 results) No results for input(s): "BNP" in the last 8760 hours.  ProBNP (last 3 results) No results for input(s): "PROBNP" in the last 8760 hours.  CBG: Recent Labs  Lab 12/25/23 0935  GLUCAP 260*    Radiological Exams on Admission: No results found.  Summary and Recommendations: Steven [name] is a 53 y.o. male with medical history significant for diabetes, VTE on Xarelto, stage II adenocarcinoma status post neoadjuvant concurrent chemoradiation 2022 followed by low anterior resection and complicated postoperative course with chronic pelvic abscess now being admitted to the hospital by the surgical service due to acute blood loss anemia from perirectal wound.    Acute blood loss anemia-hold Xarelto, agree with transfusion of 2 unit PRBC, which is already been initiated.  Type 2 diabetes-currently on clear liquid diet, recommend carb modified diet as it is advanced.  In the meantime, he has been placed on moderate dose sliding scale  History of VTE-hold Xarelto due to acute bleed  Leukocytosis-in the absence of infectious symptoms, I suspect this is reactive given his recent surgical debridement and acute bleeding.  Will defer to surgical team regarding need for empiric antibiotics.  Thank you for involving [place] in the care of your patient.  [facility] will continue to follow along with you.  Time spent: 55 minutes  Adreyan Carbajal Sharlette Dense MD [facility] Pager [phone]  If 7PM-7AM, please contact night-coverage [email or web address] Password [facility]  12/25/2023, 9:47 AM

## 2023-12-25 NOTE — Anesthesia Procedure Notes (Addendum)
 Procedure Name: LMA Insertion Date/Time: 12/25/2023 1:10 PM  Performed by: Oletha Cruel, CRNAPre-anesthesia Checklist: Patient identified, Emergency Drugs available, Suction available and Patient being monitored Patient Re-evaluated:Patient Re-evaluated prior to induction Oxygen Delivery Method: Circle system utilized Preoxygenation: Pre-oxygenation with 100% oxygen Induction Type: IV induction LMA: LMA inserted LMA Size: 4.0 Number of attempts: 1 Tube secured with: Tape Dental Injury: Teeth and Oropharynx as per pre-operative assessment  Comments: Atraumatic insertion of LMA size 4. Lips and teeth remain in preoperative condition.

## 2023-12-25 NOTE — H&P (Signed)
 Steven [name] Feb 27, 1971  [id number].    Requesting MD: Elayne Snare, MD Chief Complaint/Reason for Consult: bleeding from surgical wound   HPI:  53 y/o M with PMH [id number] rectal adenocarcinoma diagnosed 2022 s/p chemoradiation followed by Robotic LAR, ileostomy 07/17/2021 bu Dr. Maisie Fus. Post-op course  complicated by anterior midline anastomotic dehiscence managed with abx since he was already diverted with ileostomy. He ultimately underwent LAR with ileostomy takedown and end colostomy due to persistent fistula/failure to heal his anorectal anastomosis. After this he had ongoing chronic pre-sacral collection that required completion prostatectomy on 12/02/2023 followed by debridement of his perineal wound on 12/16/23 due to wound necrosis. Today he presents to the ER due to bleeding from his wound. He tells me that he was doing well at home, even felt better yesterday, but then had a spot of dark blood in his underwear early this morning followed by a large volume of bleeding from the wound aound 0600. When he got out of bed to walk to the bathroom he had a near-syncopal event and was helped to the ground by his wife. He denies LOC. EMS was called and he was transported emergently to the ED. His wife confirms that the patient has been getting BID moist-to-dry dressing changes and the drainage on his bandage has been brown and become more foul smelling in the last couple of days.   Other PMH significant for bilateral PE/DVT on Xarelto, poolry controlled diabetes (A1c 10.3% on 11/29/23), OSA  ROS: Review of Systems  All other systems reviewed and are negative.   Family History  Problem Relation Age of Onset   Diabetes Mellitus II Mother    Breast cancer Mother 55   Thrombophlebitis Father    Breast cancer Maternal Grandmother 14   Diabetes Maternal Grandmother    Breast cancer Maternal Aunt 60   Diabetes Maternal Aunt    Hypertension Paternal Grandmother    Colon cancer Neg Hx    Esophageal  cancer Neg Hx    Inflammatory bowel disease Neg Hx    Liver disease Neg Hx    Pancreatic cancer Neg Hx    Rectal cancer Neg Hx    Stomach cancer Neg Hx     Past Medical History:  Diagnosis Date   Anemia 02/2021   iron   Anxiety    Asthma    Bilateral pulmonary embolism (HCC) 04/24/2019   Dehiscence of anastomosis of large intestine 12/31/2022   Depression    Diabetes mellitus without complication (HCC)    History of kidney stones    HLD (hyperlipidemia)    Hypertension    Kidney stones    Neuromuscular disorder (HCC)    from radiation bi lat   OSA (obstructive sleep apnea)    CPAP   Peripheral vascular disease (HCC)    DVT   rectal ca 02/2021   Sleep apnea     Past Surgical History:  Procedure Laterality Date   APPENDECTOMY  1989   open appy   COLONOSCOPY  03/05/2021   CYST EXCISION  2011   left neck   DIVERTING ILEOSTOMY N/A 07/17/2021   Procedure: DIVERTING ILEOSTOMY;  Surgeon: Romie Levee, MD;  Location: [facility];  Service: General;  Laterality: N/A;   FLEXIBLE SIGMOIDOSCOPY N/A 04/04/2022   Procedure: FLEXIBLE SIGMOIDOSCOPY WITH POSSIBLE DEBRIDEMENT;  Surgeon: Romie Levee, MD;  Location: [facility];  Service: General;  Laterality: N/A;   INCISION AND DRAINAGE PERIRECTAL ABSCESS N/A 12/16/2023   Procedure: DEBRIDEMENT OF POST  OP WOUND;  Surgeon: Romie Levee, MD;  Location: [facility];  Service: General;  Laterality: N/A;  I & D PERIONEAL WOUND   RECTAL EXAM UNDER ANESTHESIA N/A 04/04/2022   Procedure: ANAL EXAM UNDER ANESTHESIA;  Surgeon: Romie Levee, MD;  Location: [facility];  Service: General;  Laterality: N/A;   RECTOPEXY N/A 12/02/2023   Procedure: Perineal Proctatectomy;  Surgeon: Romie Levee, MD;  Location: [facility];  Service: General;  Laterality: N/A;  Completion Proctatectomy   WISDOM TOOTH EXTRACTION     age 3   XI ROBOTIC ASSISTED LOWER ANTERIOR RESECTION N/A 07/17/2021   Procedure: XI ROBOTIC ASSISTED LOWER  ANTERIOR RESECTION;  Surgeon: Romie Levee, MD;  Location: [facility];  Service: General;  Laterality: N/A;   XI ROBOTIC ASSISTED LOWER ANTERIOR RESECTION N/A 12/31/2022   Procedure: ROBOTIC ASSISTED LOWER ANTERIOR RESECTION WITH OSTOMY with intraop ICG guided perfusion, takedown of iliostomy;  Surgeon: Romie Levee, MD;  Location: [facility];  Service: General;  Laterality: N/A;    Social History:  reports that he has never smoked. He has never used smokeless tobacco. He reports current alcohol use. He reports that he does not use drugs.  Allergies:  Allergies  Allergen Reactions   Contrast Media [Iodinated Contrast Media] Hives and Nausea Only    As a 53 year old experienced hives and nausea    (Not in a hospital admission)    Physical Exam: Blood pressure (!) 91/46, pulse (!) 116, temperature (!) 97.5 F (36.4 C), temperature source Oral, resp. rate 15, height 5\' 10"  (1.778 m), weight 85.3 kg, SpO2 98%. General: ill appearing white male - pale, HR 120's, muscle wasting of the extremities HEENT: head -normocephalic, atraumatic; Eyes: PERRLA, no conjunctival injection;  Neck- Trachea is midline, no thyromegaly or JVD appreciated.  CV- sinus tachycardia, no extremity edema Pulm- breathing is non-labored. CTABL, Abd- soft, NT/ND, ostomy RUQ with stoma viable GU- perineal wound with some central venous oozing - packed with surgicel and surgicel snow.  MSK- UE/LE symmetrical, no cyanosis, clubbing, or edema. Neuro- CN II-XII grossly in tact, no paresthesias. Psych- Alert and Oriented x3 with appropriate affect Skin: warm and dry, no rashes or lesions   Results for orders placed or performed during the hospital encounter of 12/25/23 (from the past 48 hours)  CBC with Differential     Status: Abnormal   Collection Time: 12/25/23  7:58 AM  Result Value Ref Range   WBC 18.1 (H) 4.0 - 10.5 K/uL   RBC 2.93 (L) 4.22 - 5.81 MIL/uL   Hemoglobin 5.8 (LL) 13.0 - 17.0 g/dL    Comment:  Reticulocyte Hemoglobin testing may be clinically indicated, consider ordering this additional test [id number] THIS CRITICAL RESULT HAS VERIFIED AND BEEN CALLED TO ESTRADA, G. RN BY JOLANDE MECIAL ON 03 28 2025 AT 0900, AND HAS BEEN READ BACK. REPEATED TO VERIFY    HCT 20.5 (L) 39.0 - 52.0 %   MCV 70.0 (L) 80.0 - 100.0 fL   MCH 19.8 (L) 26.0 - 34.0 pg   MCHC 28.3 (L) 30.0 - 36.0 g/dL   RDW 54.0 (H) 98.1 - 19.1 %   Platelets 664 (H) 150 - 400 K/uL   nRBC 0.2 0.0 - 0.2 %   Neutrophils Relative % 81 %   Neutro Abs 14.5 (H) 1.7 - 7.7 K/uL   Lymphocytes Relative 10 %   Lymphs Abs 1.8 0.7 - 4.0 K/uL   Monocytes Relative 8 %   Monocytes Absolute 1.5 (H) 0.1 -  1.0 K/uL   Eosinophils Relative 0 %   Eosinophils Absolute 0.1 0.0 - 0.5 K/uL   Basophils Relative 0 %   Basophils Absolute 0.0 0.0 - 0.1 K/uL   WBC Morphology DOHLE BODIES    Smear Review Normal platelet morphology    Immature Granulocytes 1 %   Abs Immature Granulocytes 0.21 (H) 0.00 - 0.07 K/uL   Reactive, Benign Lymphocytes PRESENT    Polychromasia PRESENT     Comment: Performed at [facility], [address], [place], [place] [address]  Type and screen [facility]     Status: None (Preliminary result)   Collection Time: 12/25/23  7:58 AM  Result Value Ref Range   ABO/RH(D) PENDING    Antibody Screen PENDING    Sample Expiration      12/28/2023,2359 Performed at [facility], [address] [place], [place] [address]   APTT     Status: Abnormal   Collection Time: 12/25/23  7:58 AM  Result Value Ref Range   aPTT 57 (H) 24 - 36 seconds    Comment:        IF BASELINE aPTT IS ELEVATED, SUGGEST PATIENT RISK ASSESSMENT BE USED TO DETERMINE APPROPRIATE ANTICOAGULANT THERAPY. Performed at [facility], [address] [place], [place] [address]   Protime-INR     Status: Abnormal   Collection Time: 12/25/23  7:58 AM  Result Value Ref Range    Prothrombin Time 29.6 (H) 11.4 - 15.2 seconds   INR 2.8 (H) 0.8 - 1.2    Comment: (NOTE) INR goal varies based on device and disease states. Performed at [facility], [address] [place], [place] [address]    No results found.    Assessment/Plan 53 y/o M s/p completion prostatectomy 3 weeks ago and wound debridement 1 week ago who presents with bleeding from his perineal wound. He has symptomatic anemia with tachycardia and systolic BP 90-100. Hgb 5.8 and 2 u PRBC were ordered. Will discuss Kcentra administration with MD. May need wound exploration in the OR and CTA pelvis.      I reviewed nursing notes, hospitalist notes, last 24 h vitals and pain scores, last 48 h intake and output, last 24 h labs and trends, and last 24 h imaging results.  Adam Phenix, [facility] 12/25/2023, 9:10 AM Please see Amion for pager number during day hours 7:00am-4:30pm or 7:00am -11:30am on weekends

## 2023-12-25 NOTE — Transfer of Care (Addendum)
 Immediate Anesthesia Transfer of Care Note  Patient: Steven [name]  Procedure(s) Performed: EXAM UNDER ANESTHESIA, RECTUM  Patient Location: PACU  Anesthesia Type:General  Level of Consciousness: drowsy and patient cooperative  Airway & Oxygen Therapy: Patient Spontanous Breathing and Patient connected to face mask oxygen  Post-op Assessment: Report given to RN and Post -op Vital signs reviewed and stable  Post vital signs: Reviewed and stable  Last Vitals:  Vitals Value Taken Time  BP 101/55 12/25/23   1412  Temp 97.8 12/25/23   1415  Pulse 71 12/25/23 1413  Resp 23 12/25/23 1413  SpO2 100 % 12/25/23 1413  Vitals shown include unfiled device data.  Last Pain:  Vitals:   12/25/23 1241  TempSrc: Oral  PainSc:          Complications: No notable events documented.

## 2023-12-25 NOTE — Progress Notes (Signed)
 Verified with Dr Isaias Cowman that she wants pre-op diabetic protocol placed for patient

## 2023-12-25 NOTE — Interval H&P Note (Signed)
 History and Physical Interval Note:  12/25/2023 12:07 PM  Steven [name]  has presented today for surgery, with the diagnosis of RECTAL BLEEDING.  The various methods of treatment have been discussed with the patient and family. After consideration of risks, benefits and other options for treatment, the patient has consented to  Procedure(s): EXAM UNDER ANESTHESIA, RECTUM (N/A) as a surgical intervention.  The patient's history has been reviewed, patient examined, no change in status, stable for surgery.  I have reviewed the patient's chart and labs.  Questions were answered to the patient's satisfaction.   The procedure has been discussed with the patient.  Alternative therapies have been discussed with the patient.  Operative risks include bleeding,  Infection,  Organ injury,  Nerve injury,  Blood vessel injury,  DVT,  Pulmonary embolism,  Death,  And possible reoperation.  Medical management risks include worsening of present situation.  The success of the procedure is 50 -90 % at treating patients symptoms.  The patient understands and agrees to proceed.   Caryl Manas A Izaiah Tabb

## 2023-12-25 NOTE — Op Note (Signed)
 Preoperative diagnosis: Bleeding from APR wound  Postoperative diagnosis: Same  Procedure: Exam under anesthesia  Surgeon: Harriette Bouillon MD   Assistants: Angelena Form MD,  Bailey Mech [facility]  Anesthesia: LMA  EBL: 200 cc  Drains: None  Indications for procedure: The patient is a 53 year old male with a complex medical history of previous colon cancer and subsequent APR.  He had adjuvant therapy as well consisting of chemotherapy radiation therapy.  His postop course been complicated by perineal wound issues and poor wound healing.  He presents today for 3 weeks after discharge of perineal portion of his procedure with bleeding from the wound.  The wound was packed in the ED and Surgicel snow placed but he continued of significant heavy bleeding.  He was also anticoagulated.  After examination due to the brisk bleeding, I recommended operative exploration initially for control.  We also discussed reversal of his anticoagulation as part of this and potential angiogram depending on intraoperative findings.  I explained this in great length with patient and family.  Complications of further bleeding, rectal current to surgery, death, DVT, exacerbation of underlying medical problems and the risk of stopping his anticoagulation.   Description of procedure: The patient was met in the the ED and transferred to the holding area and taken back to the OR.  He is placed upon the OR table where LMA anesthesia was initiated.  He was placed in lithotomy and padded appropriately.  The perineum was prepped and draped in sterile fashion timeout performed.  Packing removed.  There is brisk oozing from the wound.  We removed previous Surgicel snow.  There is significant oozing anteriorly and a small venous bleed was noted anteriorly at about 11:00.  I controlled this with cautery.  There is still some oozing from the large granulation surface.  The argon beam coagulator and and cautery used to control the surface  bleeding with good result.  Once this was performed the bleeding was much less.  A large amount of Surgicel snow was placed in the wound base.  Topical coagulants were applied using Hemoseal.  The wound was packed with saline soaked gauze at this point after ensuring hemostasis.  Dry dressings applied mesh covering.  All counts were found to be correct.  The patient was taken out of lithotomy extubated taken recovery in satisfactory condition.

## 2023-12-25 NOTE — ED Notes (Signed)
 General surgery at bedside.

## 2023-12-25 NOTE — ED Provider Notes (Signed)
 [place] EMERGENCY DEPARTMENT AT [facility] Provider Note   CSN: [id number] Arrival date & time: 12/25/23  4098     History  Chief Complaint  Patient presents with   Post-op Problem   Rectal Bleeding    Steven [name] is a 53 y.o. male.  With a history of adenocarcinoma of rectum, intraperitoneal abscess, status post colostomy and status post proctectomy who presents to the ED for postoperative bleeding.  Patient underwent perineal proctectomy with Dr. Maisie Fus on March 5.  His postoperative course was complicated by necrotic postoperative wound and he underwent excisional debridement of this wound by Dr. Maisie Fus on March 19.  Today he presents with increased bleeding from this postoperative site.  Minimal bleeding yesterday but awoke this morning with blood soaked sheets and had an episode of near syncope when he attempted to walk to the bathroom.  EMS was called for transport to the ED given persistent and copious bleeding.  Denies pain and shortness of breath.  No other complaints at this time.  Last meal was 2200 last night   Vaginal Bleeding      Home Medications Prior to Admission medications   Medication Sig Start Date End Date Taking? Authorizing Provider  acetaminophen (TYLENOL) 500 MG tablet Take 1,000 mg by mouth every 6 (six) hours as needed for mild pain.    [provider]  albuterol (PROAIR HFA) 108 (90 Base) MCG/ACT inhaler Inhale 2 puffs into the lungs every 4 (four) hours as needed for wheezing or shortness of breath. 08/30/10   [provider]  ALPRAZolam Prudy Feeler) 0.5 MG tablet Take 0.5 mg by mouth daily as needed for anxiety.    [provider]  calcium carbonate (TUMS - DOSED IN MG ELEMENTAL CALCIUM) 500 MG chewable tablet Chew 2 tablets by mouth daily as needed for indigestion or heartburn.    [provider]  Dapagliflozin Pro-metFORMIN ER (XIGDUO XR) 06-999 MG TB24 Take 1 tablet by mouth daily.    [provider]  fenofibrate 160 MG tablet Take 160 mg by mouth daily. 02/23/19   [provider]  Ferrous Sulfate (IRON PO) Take 1 tablet by mouth every evening. Patient not taking: Reported on 12/15/2023    [provider]  Gauze Pads & Dressings (COMBINE ABD) 5"X9" PADS 1 Units by Does not apply route daily. 12/17/23   Romie Levee, MD  Gauze Pads & Dressings [facility] GAUZE ROLL LARGE) MISC 1 Units by Does not apply route daily. 12/17/23   Romie Levee, MD  HYDROcodone-acetaminophen (NORCO/VICODIN) 5-325 MG tablet Take 1-2 tablets by mouth every 6 (six) hours as needed for moderate pain (pain score 4-6). 12/17/23   Romie Levee, MD  ibuprofen (ADVIL) 200 MG tablet Take 600 mg by mouth every 6 (six) hours as needed for moderate pain.    [provider]  methocarbamol (ROBAXIN) 500 MG tablet Take 2 tablets (1,000 mg total) by mouth every 8 (eight) hours as needed for muscle spasms. 12/17/23   Romie Levee, MD  naproxen sodium (ALEVE) 220 MG tablet Take 220 mg by mouth 2 (two) times daily as needed (pain).    [provider]  rivaroxaban (XARELTO) 20 MG TABS tablet Take 1 tablet (20 mg total) by mouth daily with supper. 04/13/23   Rachel Moulds, MD  tetrahydrozoline-zinc (VISINE-AC) 0.05-0.25 % ophthalmic solution Place 2 drops into both eyes 4 (four) times daily as needed (allergy).    [provider]      Allergies  Contrast media [iodinated contrast media]    Review of Systems   Review of Systems  Genitourinary:  Positive for vaginal bleeding.    Physical Exam Updated Vital Signs BP (!) 91/46 (BP Location: Right Arm)   Pulse (!) 116   Temp (!) 97.5 F (36.4 C) (Oral)   Resp 15   Ht 5\' 10"  (1.778 m)   Wt 85.3 kg   SpO2 98%   BMI 26.98 kg/m  Physical Exam Vitals and nursing note reviewed.  HENT:     Head: Normocephalic and atraumatic.  Eyes:     Pupils: Pupils are equal, round, and reactive to light.  Cardiovascular:     Rate  and Rhythm: Normal rate and regular rhythm.  Pulmonary:     Effort: Pulmonary effort is normal.     Breath sounds: Normal breath sounds.  Abdominal:     Palpations: Abdomen is soft.     Tenderness: There is no abdominal tenderness.  Genitourinary:    Comments: Large open wound over buttocks packed with gauze as pictured below Minimal active bleeding Copious blood clots within brief Skin:    General: Skin is warm and dry.  Neurological:     Mental Status: He is alert.  Psychiatric:        Mood and Affect: Mood normal.     ED Results / Procedures / Treatments   Labs (all labs ordered are listed, but only abnormal results are displayed) Labs Reviewed  CBC WITH DIFFERENTIAL/PLATELET - Abnormal; Notable for the following components:      Result Value   WBC 18.1 (*)    RBC 2.93 (*)    Hemoglobin 5.8 (*)    HCT 20.5 (*)    MCV 70.0 (*)    MCH 19.8 (*)    MCHC 28.3 (*)    RDW 18.6 (*)    Platelets 664 (*)    Neutro Abs 14.5 (*)    Monocytes Absolute 1.5 (*)    Abs Immature Granulocytes 0.21 (*)    All other components within normal limits  APTT - Abnormal; Notable for the following components:   aPTT 57 (*)    All other components within normal limits  PROTIME-INR - Abnormal; Notable for the following components:   Prothrombin Time 29.6 (*)    INR 2.8 (*)    All other components within normal limits  COMPREHENSIVE METABOLIC PANEL WITH GFR  TYPE AND SCREEN  PREPARE RBC (CROSSMATCH)    EKG None  Radiology No results found.  Procedures .Critical Care  Performed by: Royanne Foots, DO Authorized by: Royanne Foots, DO   Critical care provider statement:    Critical care time (minutes):  80   Critical care time was exclusive of:  Separately billable procedures and treating other patients and teaching time   Critical care was necessary to treat or prevent imminent or life-threatening deterioration of the following conditions:  Circulatory failure and shock    Critical care was time spent personally by me on the following activities:  Development of treatment plan with patient or surrogate, discussions with consultants, evaluation of patient's response to treatment, examination of patient, ordering and review of laboratory studies, ordering and review of radiographic studies, ordering and performing treatments and interventions, pulse oximetry, re-evaluation of patient's condition, review of old charts and obtaining history from patient or surrogate   I assumed direction of critical care for this patient from another provider in my specialty: no     Care discussed with:  admitting provider       Medications Ordered in ED Medications  0.9 %  sodium chloride infusion (Manually program via Guardrails IV Fluids) (has no administration in time range)  0.9 %  sodium chloride infusion (Manually program via Guardrails IV Fluids) (has no administration in time range)  sodium chloride 0.9 % bolus 1,000 mL (0 mLs Intravenous Stopped 12/25/23 0830)  sodium chloride 0.9 % bolus 1,000 mL (1,000 mLs Intravenous New Bag/Given 12/25/23 0830)    ED Course/ Medical Decision Making/ A&P Clinical Course as of 12/25/23 0919  Fri Dec 25, 2023  0809 Discussed with surgery PA on-call who will evaluate patient in ED and make his surgeon (Dr. Maisie Fus) aware of his presentation today [MP]  0916 Hemoglobin of 5.8.  I have ordered treat and's RBCs.  Discussed with surgery PA on-call who was evaluated patient in the ED and alerted Dr. Maisie Fus of his presentation.  He will be admitted to surgical service. [MP]    Clinical Course User Index [MP] Royanne Foots, DO                                 Medical Decision Making 53 year old male with history as above including recent perineal proctectomy and subsequent wound infection status post debridement on March 19 presenting for postoperative bleeding from this site.  Minimal active bleeding on my assessment but there is copious clots  and dried blood soaked through the brief and sheets.  Repacked with Xeroform gauze and placed adhesive dressing over the wound.  Patient is hypotensive upon arrival.  Will provide volume resuscitation with IV fluids and obtain laboratory workup including type and screen.  He is amenable to blood transfusion if needed.  Suspect hypotension due to large volume of blood loss from postoperative site.  Will consult general surgery to evaluate patient in ED and continue to monitor hemodynamic status  Amount and/or Complexity of Data Reviewed Labs: ordered.           Final Clinical Impression(s) / ED Diagnoses Final diagnoses:  Post-operative hemoglobin drop  History of prostatectomy  Hemorrhagic shock [facility])    Rx / DC Orders ED Discharge Orders     None         Royanne Foots, DO 12/25/23 [phone]

## 2023-12-26 ENCOUNTER — Encounter (HOSPITAL_COMMUNITY): Payer: Self-pay | Admitting: Surgery

## 2023-12-26 DIAGNOSIS — D62 Acute posthemorrhagic anemia: Secondary | ICD-10-CM | POA: Diagnosis not present

## 2023-12-26 LAB — GLUCOSE, CAPILLARY
Glucose-Capillary: 157 mg/dL — ABNORMAL HIGH (ref 70–99)
Glucose-Capillary: 220 mg/dL — ABNORMAL HIGH (ref 70–99)
Glucose-Capillary: 257 mg/dL — ABNORMAL HIGH (ref 70–99)
Glucose-Capillary: 211 mg/dL — ABNORMAL HIGH (ref 70–99)

## 2023-12-26 LAB — COMPREHENSIVE METABOLIC PANEL WITH GFR
ALT: 10 U/L (ref 0–44)
AST: 11 U/L — ABNORMAL LOW (ref 15–41)
Albumin: 1.7 g/dL — ABNORMAL LOW (ref 3.5–5.0)
Alkaline Phosphatase: 76 U/L (ref 38–126)
Anion gap: 6 (ref 5–15)
BUN: 21 mg/dL — ABNORMAL HIGH (ref 6–20)
CO2: 22 mmol/L (ref 22–32)
Calcium: 7.6 mg/dL — ABNORMAL LOW (ref 8.9–10.3)
Chloride: 109 mmol/L (ref 98–111)
Creatinine, Ser: 0.88 mg/dL (ref 0.61–1.24)
GFR, Estimated: >60 mL/min (ref >60)
Glucose, Bld: 161 mg/dL — ABNORMAL HIGH (ref 70–99)
Potassium: 4.1 mmol/L (ref 3.5–5.1)
Sodium: 137 mmol/L (ref 135–145)
Total Bilirubin: 0.8 mg/dL (ref 0.0–1.2)
Total Protein: 5.4 g/dL — ABNORMAL LOW (ref 6.5–8.1)

## 2023-12-26 LAB — CBC
HCT: 24.3 % — ABNORMAL LOW (ref 39.0–52.0)
Hemoglobin: 7.6 g/dL — ABNORMAL LOW (ref 13.0–17.0)
MCH: 24.4 pg — ABNORMAL LOW (ref 26.0–34.0)
MCHC: 31.3 g/dL (ref 30.0–36.0)
MCV: 77.9 fL — ABNORMAL LOW (ref 80.0–100.0)
Platelets: 475 10*3/uL — ABNORMAL HIGH (ref 150–400)
RBC: 3.12 MIL/uL — ABNORMAL LOW (ref 4.22–5.81)
RDW: 21.4 % — ABNORMAL HIGH (ref 11.5–15.5)
WBC: 9.9 10*3/uL (ref 4.0–10.5)
nRBC: 0.5 % — ABNORMAL HIGH (ref 0.0–0.2)

## 2023-12-26 MED ORDER — MAGIC MOUTHWASH W/LIDOCAINE
10.0000 mL | Freq: Three times a day (TID) | ORAL | Status: DC
  Administered 2023-12-26 (×2): 10 mL via ORAL
  Administered 2023-12-27: 5 mL via ORAL
  Administered 2023-12-30 – 2024-01-12 (×14): 10 mL via ORAL
  Filled 2023-12-26 (×57): qty 10

## 2023-12-26 MED ORDER — HYDROMORPHONE HCL 1 MG/ML IJ SOLN
0.5000 mg | INTRAMUSCULAR | Status: DC | PRN
  Administered 2023-12-26 – 2023-12-30 (×10): 0.5 mg via INTRAVENOUS
  Filled 2023-12-26 (×10): qty 1

## 2023-12-26 NOTE — Plan of Care (Signed)
  Problem: Education: Goal: Ability to describe self-care measures that may prevent or decrease complications (Diabetes Survival Skills Education) will improve Outcome: Progressing Goal: Individualized Educational Video(s) Outcome: Progressing   Problem: Coping: Goal: Ability to adjust to condition or change in health will improve Outcome: Progressing   Problem: Fluid Volume: Goal: Ability to maintain a balanced intake and output will improve Outcome: Progressing   Problem: Health Behavior/Discharge Planning: Goal: Ability to identify and utilize available resources and services will improve Outcome: Progressing Goal: Ability to manage health-related needs will improve Outcome: Progressing   Problem: Metabolic: Goal: Ability to maintain appropriate glucose levels will improve Outcome: Progressing   Problem: Nutritional: Goal: Progress toward achieving an optimal weight will improve Outcome: Progressing   Problem: Tissue Perfusion: Goal: Adequacy of tissue perfusion will improve Outcome: Progressing   Problem: Education: Goal: Knowledge of General Education information will improve Description: Including pain rating scale, medication(s)/side effects and non-pharmacologic comfort measures Outcome: Progressing   Problem: Health Behavior/Discharge Planning: Goal: Ability to manage health-related needs will improve Outcome: Progressing   Problem: Clinical Measurements: Goal: Ability to maintain clinical measurements within normal limits will improve Outcome: Progressing Goal: Diagnostic test results will improve Outcome: Progressing Goal: Respiratory complications will improve Outcome: Progressing

## 2023-12-26 NOTE — Progress Notes (Signed)
 1 Day Post-Op   Subjective/Chief Complaint: Complaining of some perineal pain - controlled with PRN meds No nausea or vomiting Good ostomy output   Objective: Vital signs in last 24 hours: Temp:  [97.8 F (36.6 C)-99.9 F (37.7 C)] 99 F (37.2 C) (03/29 0818) Pulse Rate:  [89-112] 97 (03/29 0700) Resp:  [12-22] 14 (03/29 0700) BP: (80-123)/(34-96) 116/63 (03/29 0700) SpO2:  [95 %-100 %] 96 % (03/29 0700) Last BM Date : 12/25/23  Intake/Output from previous day: 03/28 0701 - 03/29 0700 In: 4346.4 [I.V.:1000; Blood:947.3; IV Piggyback:2399.2] Out: 1550 [Urine:1550] Intake/Output this shift: No intake/output data recorded.  WDWN in NAD Abd - soft, NT;  Ostomy functioning Perineum - dressing with some non-bloody drainage Will remove tomorrow.  Lab Results:  Recent Labs    12/25/23 0758 12/25/23 1652 12/26/23 0025  WBC 18.1*  --  9.9  HGB 5.8* 6.3* 7.6*  HCT 20.5* 20.8* 24.3*  PLT 664*  --  475*   BMET Recent Labs    12/25/23 0758 12/26/23 0025  NA 135 137  K 4.6 4.1  CL 106 109  CO2 21* 22  GLUCOSE 328* 161*  BUN 27* 21*  CREATININE 0.97 0.88  CALCIUM 8.2* 7.6*   PT/INR Recent Labs    12/25/23 0758  LABPROT 29.6*  INR 2.8*   ABG No results for input(s): "PHART", "HCO3" in the last 72 hours.  Invalid input(s): "PCO2", "PO2"  Studies/Results: No results found.  Anti-infectives: Anti-infectives (From admission, onward)    Start     Dose/Rate Route Frequency Ordered Stop   12/25/23 1800  piperacillin-tazobactam (ZOSYN) IVPB 3.375 g       Note to Pharmacy: Pharmacy to check dosing >> approved by [facility]   3.375 g 12.5 mL/hr over 240 Minutes Intravenous Every 8 hours 12/25/23 1713         Assessment/Plan: s/p Procedure(s): EXAM UNDER ANESTHESIA, RECTUM (N/A) Bleeding from perineal wound - s/p cauterization/ packing 3/28  Will leave dressing in place until tomorrow Continue to hold anticoagulation Advance diet as tolerated Magic mouthwash  for mouth sores   LOS: 1 day    Steven [name] 12/26/2023

## 2023-12-26 NOTE — Progress Notes (Signed)
   12/26/23 2200  BiPAP/CPAP/SIPAP  Reason BIPAP/CPAP not in use Non-compliant (patient does not want to wear)  BiPAP/CPAP /SiPAP Vitals  Pulse Rate (!) 108  Resp 14  SpO2 96 %  MEWS Score/Color  MEWS Score 3  MEWS Score Color Yellow

## 2023-12-26 NOTE — Progress Notes (Signed)
 PROGRESS NOTE    Steven [name]  UEA:[id number] DOB: 12/06/1970 DOA: 12/25/2023 PCP: Noberto Retort, MD   Brief Narrative:  This 53 yrs old male with PMH significant for diabetes, VTE on Xarelto, stage II adenocarcinoma status post neoadjuvant concurrent chemoradiation 2022 followed by low anterior resection and complicated postoperative course with chronic pelvic abscess now being admitted to the hospital under surgical service due to acute blood loss anemia from perirectal wound.  Hospitalist consultation has been requested to assist with medical management, primarily for his diabetes.  He was recently admitted overnight by the surgical service and discharged home on 12/17/2023 after surgical debridement of his abdominal complete prostatectomy wound.  He has continued to take Xarelto for his history of VTE, last dose last evening 3/27.  This morning, he started having bright red blood from his rectal wound.  Workup showed severe acute blood loss anemia.  Patient has been admitted to the hospital by surgical service, 2 units of blood are being transfused currently and hospitalist consultation was requested for medical management.   Assessment & Plan:   Principal Problem:   Anemia due to acute blood loss  Acute blood loss anemia: Hold Xarelto, status post 2 unit PRBC.  Hemoglobin improved to 7.6. Continue to monitor H&H.  Transfuse if Hb below hemoglobin 7.0   Type 2 diabetes: Continue carb modified diet, Regular insulin sliding scale. Obtain hemoglobin A1c.   History of VTE: Hold Xarelto due to acute bleeding.   Leukocytosis: In the absence of infectious symptoms, Suspect reactive given his recent surgical debridement and acute bleeding.   Continue IV Zosyn as per surgical team.   Thank you for involving [place] in the care of your patient.   TRH will continue to follow along with you.    DVT prophylaxis: SCDs Code Status: Full code Family Communication: No family at bed  side. Disposition Plan:  He remains inpatient due to severity of illness. We were called for medical management.    Procedures:   Antimicrobials:  Anti-infectives (From admission, onward)    Start     Dose/Rate Route Frequency Ordered Stop   12/25/23 1800  piperacillin-tazobactam (ZOSYN) IVPB 3.375 g       Note to Pharmacy: Pharmacy to check dosing >> approved by [facility]   3.375 g 12.5 mL/hr over 240 Minutes Intravenous Every 8 hours 12/25/23 1713         Subjective: Patient was seen and examined at bedside.  Overnight events noted. Patient reports having perirectal pain.  He reports pain is reasonably controlled.  Objective: Vitals:   12/26/23 0700 12/26/23 0800 12/26/23 0818 12/26/23 0900  BP: 116/63 126/62  117/62  Pulse: 97 98 97 100  Resp: 14 14 17 19   Temp:   99 F (37.2 C)   TempSrc:   Oral   SpO2: 96% 97% 98% 96%  Weight:      Height:        Intake/Output Summary (Last 24 hours) at 12/26/2023 1117 Last data filed at 12/26/2023 0900 Gross per 24 hour  Intake 3867.43 ml  Output 1550 ml  Net 2317.43 ml   Filed Weights   12/25/23 0745  Weight: 85.3 kg    Examination:  General exam: Appears calm and comfortable, not in any acute distress. Respiratory system: Clear to auscultation. Respiratory effort normal.  RR 15 Cardiovascular system: S1 & S2 heard, RRR. No JVD, murmurs, rubs, gallops or clicks.  Gastrointestinal system: Abdomen is non distended, soft and non tender. Normal bowel  sounds heard. Central nervous system: Alert and oriented x 3. No focal neurological deficits. Extremities: No edema, no cyanosis, no clubbing. Skin: Dressing noted in the perirectal area.  No rashes, lesions or ulcers Psychiatry: Judgement and insight appear normal. Mood & affect appropriate.     Data Reviewed: I have personally reviewed following labs and imaging studies  CBC: Recent Labs  Lab 12/25/23 0758 12/25/23 1652 12/26/23 0025  WBC 18.1*  --  9.9  NEUTROABS 14.5*   --   --   HGB 5.8* 6.3* 7.6*  HCT 20.5* 20.8* 24.3*  MCV 70.0*  --  77.9*  PLT 664*  --  475*   Basic Metabolic Panel: Recent Labs  Lab 12/25/23 0758 12/26/23 0025  NA 135 137  K 4.6 4.1  CL 106 109  CO2 21* 22  GLUCOSE 328* 161*  BUN 27* 21*  CREATININE 0.97 0.88  CALCIUM 8.2* 7.6*   GFR: Estimated Creatinine Clearance: 101.4 mL/min (by C-G formula based on SCr of 0.88 mg/dL). Liver Function Tests: Recent Labs  Lab 12/25/23 0758 12/26/23 0025  AST 12* 11*  ALT 10 10  ALKPHOS 88 76  BILITOT 0.2 0.8  PROT 5.9* 5.4*  ALBUMIN 1.7* 1.7*   No results for input(s): "LIPASE", "AMYLASE" in the last 168 hours. No results for input(s): "AMMONIA" in the last 168 hours. Coagulation Profile: Recent Labs  Lab 12/25/23 0758  INR 2.8*   Cardiac Enzymes: No results for input(s): "CKTOTAL", "CKMB", "CKMBINDEX", "TROPONINI" in the last 168 hours. BNP (last 3 results) No results for input(s): "PROBNP" in the last 8760 hours. HbA1C: No results for input(s): "HGBA1C" in the last 72 hours. CBG: Recent Labs  Lab 12/25/23 1227 12/25/23 1417 12/25/23 1800 12/25/23 2118 12/26/23 0748  GLUCAP 240* 189* 150* 137* 157*   Lipid Profile: No results for input(s): "CHOL", "HDL", "LDLCALC", "TRIG", "CHOLHDL", "LDLDIRECT" in the last 72 hours. Thyroid Function Tests: No results for input(s): "TSH", "T4TOTAL", "FREET4", "T3FREE", "THYROIDAB" in the last 72 hours. Anemia Panel: No results for input(s): "VITAMINB12", "FOLATE", "FERRITIN", "TIBC", "IRON", "RETICCTPCT" in the last 72 hours. Sepsis Labs: No results for input(s): "PROCALCITON", "LATICACIDVEN" in the last 168 hours.  Recent Results (from the past 240 hours)  MRSA Next Gen by PCR, Nasal     Status: None   Collection Time: 12/25/23  4:38 PM   Specimen: Nasal Mucosa; Nasal Swab  Result Value Ref Range Status   MRSA by PCR Next Gen NOT DETECTED NOT DETECTED Final    Comment: (NOTE) The GeneXpert MRSA Assay (FDA approved  for NASAL specimens only), is one component of a comprehensive MRSA colonization surveillance program. It is not intended to diagnose MRSA infection nor to guide or monitor treatment for MRSA infections. Test performance is not FDA approved in patients less than 20 years old. Performed at [facility], [address] [place], [place] [address]    Radiology Studies: No results found.  Scheduled Meds:  sodium chloride   Intravenous Once   sodium chloride   Intravenous Once   sodium chloride   Intravenous Once   sodium chloride   Intravenous Once   Chlorhexidine Gluconate Cloth  6 each Topical Daily   docusate sodium  100 mg Oral BID   insulin aspart  0-15 Units Subcutaneous TID WC   insulin aspart  0-5 Units Subcutaneous QHS   magic mouthwash w/lidocaine  10 mL Oral TID   Continuous Infusions:  piperacillin-tazobactam (ZOSYN)  IV 3.375 g (12/26/23 0927)  LOS: 1 day    Time spent: 50 mins    Willeen Niece, MD [facility]   If 7PM-7AM, please contact night-coverage

## 2023-12-27 DIAGNOSIS — D62 Acute posthemorrhagic anemia: Secondary | ICD-10-CM | POA: Diagnosis not present

## 2023-12-27 LAB — CBC
HCT: 21 % — ABNORMAL LOW (ref 39.0–52.0)
Hemoglobin: 6.6 g/dL — CL (ref 13.0–17.0)
MCH: 23.7 pg — ABNORMAL LOW (ref 26.0–34.0)
MCHC: 31.4 g/dL (ref 30.0–36.0)
MCV: 75.5 fL — ABNORMAL LOW (ref 80.0–100.0)
Platelets: 462 10*3/uL — ABNORMAL HIGH (ref 150–400)
RBC: 2.78 MIL/uL — ABNORMAL LOW (ref 4.22–5.81)
RDW: 22.6 % — ABNORMAL HIGH (ref 11.5–15.5)
WBC: 10.6 10*3/uL — ABNORMAL HIGH (ref 4.0–10.5)
nRBC: 0 % (ref 0.0–0.2)

## 2023-12-27 LAB — GLUCOSE, CAPILLARY
Glucose-Capillary: 159 mg/dL — ABNORMAL HIGH (ref 70–99)
Glucose-Capillary: 129 mg/dL — ABNORMAL HIGH (ref 70–99)
Glucose-Capillary: 204 mg/dL — ABNORMAL HIGH (ref 70–99)
Glucose-Capillary: 262 mg/dL — ABNORMAL HIGH (ref 70–99)

## 2023-12-27 LAB — PREPARE RBC (CROSSMATCH)

## 2023-12-27 MED ORDER — SODIUM CHLORIDE 0.9% IV SOLUTION: Freq: Once | INTRAVENOUS | Status: AC

## 2023-12-27 MED ORDER — CARBAMIDE PEROXIDE 6.5 % OT SOLN
5.0000 [drp] | Freq: Two times a day (BID) | OTIC | Status: DC
  Administered 2023-12-27 – 2024-01-06 (×14): 5 [drp] via OTIC
  Filled 2023-12-27: qty 15

## 2023-12-27 NOTE — Progress Notes (Signed)
 2 Days Post-Op   Subjective/Chief Complaint: Patient is more awake and alert, more comfortable Tolerated dressing change well today No nausea or vomiting  Hgb 6.6 this morning.  Receiving transfusion of PRBC.  Objective: Vital signs in last 24 hours: Temp:  [99 F (37.2 C)-102.1 F (38.9 C)] 99.7 F (37.6 C) (03/30 5409) Pulse Rate:  [96-112] 98 (03/30 0700) Resp:  [10-21] 17 (03/30 0700) BP: (108-136)/(56-76) 114/59 (03/30 0700) SpO2:  [95 %-100 %] 96 % (03/30 0700) Weight:  [81.5 kg] 81.5 kg (03/30 0100) Last BM Date : 12/25/23  Intake/Output from previous day: 03/29 0701 - 03/30 0700 In: 2019.4 [I.V.:1871.8; IV Piggyback:147.6] Out: 2800 [Urine:2800] Intake/Output this shift: No intake/output data recorded.  WDWN in NAD Abd - soft, NT;  Ostomy functioning Perineal wound - foul-smelling dark brown drainage on dressing as it was removed.  No sign of bleeding.  As I cleaned the sides of the wound, some granulation tissue is seen.  This dark drainage is likely necrotic tissue, as it appears different than the stool in the ostomy bag.  The wound is cleaned as well as possible at the bedside and repacked with saline-moistened Kerlix, covered with ABD.  Lab Results:  Recent Labs    12/26/23 0025 12/27/23 0255  WBC 9.9 10.6*  HGB 7.6* 6.6*  HCT 24.3* 21.0*  PLT 475* 462*   BMET Recent Labs    12/25/23 0758 12/26/23 0025  NA 135 137  K 4.6 4.1  CL 106 109  CO2 21* 22  GLUCOSE 328* 161*  BUN 27* 21*  CREATININE 0.97 0.88  CALCIUM 8.2* 7.6*   PT/INR Recent Labs    12/25/23 0758  LABPROT 29.6*  INR 2.8*     Anti-infectives: Anti-infectives (From admission, onward)    Start     Dose/Rate Route Frequency Ordered Stop   12/25/23 1800  piperacillin-tazobactam (ZOSYN) IVPB 3.375 g       Note to Pharmacy: Pharmacy to check dosing >> approved by [facility]   3.375 g 12.5 mL/hr over 240 Minutes Intravenous Every 8 hours 12/25/23 1713          Assessment/Plan: S/p LAR s/p APR with wound necrosis S/p EUA for bleeding from the perineal wound 12/25/23  Currently, no active bleeding Continue daily dressing changes by MD for now.   Transfuse as needed. Hold anticoagulation until Hgb stable   LOS: 2 days    Wynona Luna 12/27/2023

## 2023-12-27 NOTE — TOC Initial Note (Signed)
 Transition of Care [facility]) - Initial/Assessment Note    Patient Details  Name: Steven [name] MRN: [id number] Date of Birth: 1971-01-16  Transition of Care [facility]) CM/SW Contact:    Diona Browner, LCSW Phone Number: 12/27/2023, 9:49 AM  Clinical Narrative:                 Pt from home w/ family. Pt continues medical workup. TOC following for d/c needs.     Barriers to Discharge: Continued Medical Work up   Patient Goals and CMS Choice Patient states their goals for this hospitalization and ongoing recovery are:: return home CMS Medicare.gov Compare Post Acute Care list provided to::  (na) Choice offered to / list presented to : NA [place] ownership interest in [facility].provided to::  (na)    Expected Discharge Plan and Services In-house Referral: NA   Post Acute Care Choice: Resumption of Svcs/PTA Provider Living arrangements for the past 2 months: Single Family Home                 DME Arranged: N/A DME Agency: NA                  Prior Living Arrangements/Services Living arrangements for the past 2 months: Single Family Home Lives with:: Spouse Patient language and need for interpreter reviewed:: Yes Do you feel safe going back to the place where you live?: Yes      Need for Family Participation in Patient Care: Yes (Comment) Care giver support system in place?: Yes (comment) Current home services: Home RN Criminal Activity/Legal Involvement Pertinent to Current Situation/Hospitalization: No - Comment as needed  Activities of Daily Living   ADL Screening (condition at time of admission) Independently performs ADLs?: Yes (appropriate for developmental age) Is the patient deaf or have difficulty hearing?: No Does the patient have difficulty seeing, even when wearing glasses/contacts?: No Does the patient have difficulty concentrating, remembering, or making decisions?: No  Permission Sought/Granted                  Emotional  Assessment Appearance:: Appears stated age Attitude/Demeanor/Rapport: Engaged Affect (typically observed): Accepting Orientation: : Oriented to Self, Oriented to Place, Oriented to  Time, Oriented to Situation Alcohol / Substance Use: Not Applicable Psych Involvement: No (comment)  Admission diagnosis:  Hemorrhagic shock (HCC) [R57.8] Anemia due to acute blood loss [D62] History of prostatectomy [Z90.79] Post-operative hemoglobin drop [R71.0, Z98.890] Patient Active Problem List   Diagnosis Date Noted   Anemia due to acute blood loss 12/25/2023   Necrosis of surgical wound (HCC) 12/15/2023   AKI (acute kidney injury) (HCC) 11/30/2023   Chronic pelvic abscess due to rectal stump disruption 11/29/2023   Parastomal hernia without obstruction or gangrene 11/29/2023   Colostomy in place [facility]) 11/29/2023   History of pulmonary embolus (PE) 11/29/2023   Chronic anticoagulation 11/29/2023   Intraperitoneal abscess (HCC) 11/29/2023   Peripheral neuropathy due to chemotherapy (HCC) 07/07/2022   Cancer related pain 04/30/2021   Tenesmus 04/16/2021   Chemotherapy-induced nausea 04/03/2021   Adenocarcinoma of rectum (HCC) 03/21/2021   Sensorineural hearing loss (SNHL) of both ears 01/21/2021   Gastroesophageal reflux disease 12/26/2020   Subjective hearing loss 12/18/2020   Uncontrolled diabetes mellitus with hyperglycemia (HCC) 04/26/2019   Obesity (BMI 30-39.9) 04/26/2019   Asthma    Anxiety    OSA (obstructive sleep apnea)    HLD (hyperlipidemia)    PCP:  Noberto Retort, MD Pharmacy:   [facility] [phone] -  [place], [place] - [address] AT [facility] [address] [place] [address] Phone: [phone] Fax: [phone]  [facility] - [place], [place] - [phone] [facility] [address] [place] [address] Phone: [phone] Fax: [phone]  [facility] [place], [place] - [place] [address] [place] [address] Phone: [phone] Fax: [phone]     Social Drivers of Health (SDOH) Social History: SDOH Screenings   Food Insecurity: No Food Insecurity (12/26/2023)  Housing: Low Risk  (12/26/2023)  Transportation Needs: No Transportation Needs (12/26/2023)  Utilities: Not At Risk (12/26/2023)  Social Connections: Socially Integrated (11/30/2023)  Tobacco Use: Low Risk  (12/25/2023)   SDOH Interventions:     Readmission Risk Interventions    12/27/2023    9:47 AM 12/07/2023    2:23 PM  Readmission Risk Prevention Plan  Transportation Screening Complete Complete  PCP or Specialist Appt within 5-7 Days  Complete  PCP or Specialist Appt within 3-5 Days Complete   Home Care Screening  Complete  Medication Review (RN CM)  Complete  HRI or Home Care Consult Complete   Social Work Consult for Recovery Care Planning/Counseling Complete   Palliative Care Screening Not Applicable   Medication Review Ocean[employer]) Complete

## 2023-12-27 NOTE — Progress Notes (Signed)
 PROGRESS NOTE    Steven [name]  ZOX:[id number] DOB: 09-12-1971 DOA: 12/25/2023 PCP: Noberto Retort, MD   Brief Narrative:  This 53 yrs old male with PMH significant for diabetes, VTE on Xarelto, stage II adenocarcinoma status post neoadjuvant concurrent chemoradiation 2022 followed by low anterior resection and complicated postoperative course with chronic pelvic abscess now being admitted to the hospital under surgical service due to acute blood loss anemia from perirectal wound.  Hospitalist consultation has been requested to assist with medical management, primarily for his diabetes.  He was recently admitted overnight by the surgical service and discharged home on 12/17/2023 after surgical debridement of his abdominal complete prostatectomy wound.  He has continued to take Xarelto for his history of VTE, last dose last evening 3/27.  This morning, he started having bright red blood from his rectal wound.  Workup showed severe acute blood loss anemia.  Patient has been admitted to the hospital by surgical service, 2 units of blood are being transfused currently and hospitalist consultation was requested for medical management.   Assessment & Plan:   Principal Problem:   Anemia due to acute blood loss  Acute blood loss anemia: Hold Xarelto, status post 2 unit PRBC.  Hemoglobin improved to 7.6. Continue to monitor H&H.  Transfuse if Hb below hemoglobin 7.0 Hb dropped to 6.6  > Transfuse 1 Unit PRBC   Type 2 diabetes: Continue carb modified diet, Regular insulin sliding scale. Obtain hemoglobin A1c.   History of VTE: Hold Xarelto due to acute bleeding.   Leukocytosis: In the absence of infectious symptoms, Suspect reactive given his recent surgical debridement and acute bleeding.   Continue IV Zosyn as per surgical team. He spiked fever, improved with tylenol.   Thank you for involving [place] in the care of your patient.   TRH will continue to follow along with you.    DVT  prophylaxis: SCDs Code Status: Full code Family Communication: No family at bed side. Disposition Plan:  He remains inpatient due to severity of illness. We were called for medical management.    Procedures:   Antimicrobials:  Anti-infectives (From admission, onward)    Start     Dose/Rate Route Frequency Ordered Stop   12/25/23 1800  piperacillin-tazobactam (ZOSYN) IVPB 3.375 g       Note to Pharmacy: Pharmacy to check dosing >> approved by [facility]   3.375 g 12.5 mL/hr over 240 Minutes Intravenous Every 8 hours 12/25/23 1713         Subjective: Patient was seen and examined at bedside. Overnight events noted. Patient reports having perirectal pain.  He reports pain is reasonably controlled. He tolerated dressing change today.  He was getting blood transfusion  Objective: Vitals:   12/27/23 0804 12/27/23 0842 12/27/23 0853 12/27/23 0900  BP:  (!) 116/58  (!) 110/59  Pulse: 98 96    Resp: 12 13  14   Temp:  98.9 F (37.2 C) 98.8 F (37.1 C)   TempSrc:  Oral Oral   SpO2: 97% 96%    Weight:      Height:        Intake/Output Summary (Last 24 hours) at 12/27/2023 0951 Last data filed at 12/27/2023 0842 Gross per 24 hour  Intake 926.16 ml  Output 2800 ml  Net -1873.84 ml   Filed Weights   12/25/23 0745 12/27/23 0100  Weight: 85.3 kg 81.5 kg    Examination:  General exam: Appears calm and comfortable, not in any acute distress. Respiratory system: CTA  Bilaterally. Respiratory effort normal.  RR 15 Cardiovascular system: S1 & S2 heard, RRR. No JVD, murmurs, rubs, gallops or clicks.  Gastrointestinal system: Abdomen is non distended, soft and non tender. Normal bowel sounds heard. Central nervous system: Alert and oriented x 3. No focal neurological deficits. Extremities: No edema, no cyanosis, no clubbing. Skin: Dressing noted in the perirectal area.  No rashes, lesions or ulcers Psychiatry: Judgement and insight appear normal. Mood & affect appropriate.     Data  Reviewed: I have personally reviewed following labs and imaging studies  CBC: Recent Labs  Lab 12/25/23 0758 12/25/23 1652 12/26/23 0025 12/27/23 0255  WBC 18.1*  --  9.9 10.6*  NEUTROABS 14.5*  --   --   --   HGB 5.8* 6.3* 7.6* 6.6*  HCT 20.5* 20.8* 24.3* 21.0*  MCV 70.0*  --  77.9* 75.5*  PLT 664*  --  475* 462*   Basic Metabolic Panel: Recent Labs  Lab 12/25/23 0758 12/26/23 0025  NA 135 137  K 4.6 4.1  CL 106 109  CO2 21* 22  GLUCOSE 328* 161*  BUN 27* 21*  CREATININE 0.97 0.88  CALCIUM 8.2* 7.6*   GFR: Estimated Creatinine Clearance: 101.4 mL/min (by C-G formula based on SCr of 0.88 mg/dL). Liver Function Tests: Recent Labs  Lab 12/25/23 0758 12/26/23 0025  AST 12* 11*  ALT 10 10  ALKPHOS 88 76  BILITOT 0.2 0.8  PROT 5.9* 5.4*  ALBUMIN 1.7* 1.7*   No results for input(s): "LIPASE", "AMYLASE" in the last 168 hours. No results for input(s): "AMMONIA" in the last 168 hours. Coagulation Profile: Recent Labs  Lab 12/25/23 0758  INR 2.8*   Cardiac Enzymes: No results for input(s): "CKTOTAL", "CKMB", "CKMBINDEX", "TROPONINI" in the last 168 hours. BNP (last 3 results) No results for input(s): "PROBNP" in the last 8760 hours. HbA1C: No results for input(s): "HGBA1C" in the last 72 hours. CBG: Recent Labs  Lab 12/26/23 0748 12/26/23 1201 12/26/23 1555 12/26/23 2203 12/27/23 0815  GLUCAP 157* 220* 257* 211* 159*   Lipid Profile: No results for input(s): "CHOL", "HDL", "LDLCALC", "TRIG", "CHOLHDL", "LDLDIRECT" in the last 72 hours. Thyroid Function Tests: No results for input(s): "TSH", "T4TOTAL", "FREET4", "T3FREE", "THYROIDAB" in the last 72 hours. Anemia Panel: No results for input(s): "VITAMINB12", "FOLATE", "FERRITIN", "TIBC", "IRON", "RETICCTPCT" in the last 72 hours. Sepsis Labs: No results for input(s): "PROCALCITON", "LATICACIDVEN" in the last 168 hours.  Recent Results (from the past 240 hours)  MRSA Next Gen by PCR, Nasal     Status:  None   Collection Time: 12/25/23  4:38 PM   Specimen: Nasal Mucosa; Nasal Swab  Result Value Ref Range Status   MRSA by PCR Next Gen NOT DETECTED NOT DETECTED Final    Comment: (NOTE) The GeneXpert MRSA Assay (FDA approved for NASAL specimens only), is one component of a comprehensive MRSA colonization surveillance program. It is not intended to diagnose MRSA infection nor to guide or monitor treatment for MRSA infections. Test performance is not FDA approved in patients less than 76 years old. Performed at [facility], [address], [place], [place] [address]    Radiology Studies: No results found.  Scheduled Meds:  sodium chloride   Intravenous Once   sodium chloride   Intravenous Once   sodium chloride   Intravenous Once   sodium chloride   Intravenous Once   Chlorhexidine Gluconate Cloth  6 each Topical Daily   docusate sodium  100 mg Oral  BID   insulin aspart  0-15 Units Subcutaneous TID WC   insulin aspart  0-5 Units Subcutaneous QHS   magic mouthwash w/lidocaine  10 mL Oral TID   Continuous Infusions:  piperacillin-tazobactam (ZOSYN)  IV 3.375 g (12/27/23 0939)     LOS: 2 days    Time spent: 35 mins    Willeen Niece, MD [facility]   If 7PM-7AM, please contact night-coverage

## 2023-12-28 DIAGNOSIS — D62 Acute posthemorrhagic anemia: Secondary | ICD-10-CM | POA: Diagnosis not present

## 2023-12-28 LAB — TYPE AND SCREEN
ABO/RH(D): A POS
Antibody Screen: NEGATIVE
Unit division: 0
Unit division: 0
Unit division: 0
Unit division: 0

## 2023-12-28 LAB — CBC
HCT: 27.1 % — ABNORMAL LOW (ref 39.0–52.0)
Hemoglobin: 8.3 g/dL — ABNORMAL LOW (ref 13.0–17.0)
MCH: 24.7 pg — ABNORMAL LOW (ref 26.0–34.0)
MCHC: 30.6 g/dL (ref 30.0–36.0)
MCV: 80.7 fL (ref 80.0–100.0)
Platelets: 462 10*3/uL — ABNORMAL HIGH (ref 150–400)
RBC: 3.36 MIL/uL — ABNORMAL LOW (ref 4.22–5.81)
RDW: 23.7 % — ABNORMAL HIGH (ref 11.5–15.5)
WBC: 15.8 10*3/uL — ABNORMAL HIGH (ref 4.0–10.5)
nRBC: 0 % (ref 0.0–0.2)

## 2023-12-28 LAB — BPAM RBC
Blood Product Expiration Date: 202504152359
Blood Product Expiration Date: 202504202359
Blood Product Expiration Date: 202504202359
Blood Product Expiration Date: 202504272359
ISSUE DATE / TIME: 202503280930
ISSUE DATE / TIME: 202503281158
ISSUE DATE / TIME: 202503281840
ISSUE DATE / TIME: 202503300600
Unit Type and Rh: 6200
Unit Type and Rh: 6200
Unit Type and Rh: 6200
Unit Type and Rh: 6200

## 2023-12-28 LAB — HEMOGLOBIN A1C
Hgb A1c MFr Bld: 7.5 % — ABNORMAL HIGH (ref 4.8–5.6)
Mean Plasma Glucose: 168.55 mg/dL

## 2023-12-28 LAB — GLUCOSE, CAPILLARY
Glucose-Capillary: 211 mg/dL — ABNORMAL HIGH (ref 70–99)
Glucose-Capillary: 210 mg/dL — ABNORMAL HIGH (ref 70–99)
Glucose-Capillary: 214 mg/dL — ABNORMAL HIGH (ref 70–99)
Glucose-Capillary: 245 mg/dL — ABNORMAL HIGH (ref 70–99)

## 2023-12-28 MED ORDER — GLUCERNA SHAKE PO LIQD
237.0000 mL | Freq: Three times a day (TID) | ORAL | Status: DC
  Administered 2023-12-28 – 2023-12-29 (×4): 237 mL via ORAL
  Filled 2023-12-28 (×13): qty 237

## 2023-12-28 MED ORDER — ENSURE ENLIVE PO LIQD
237.0000 mL | Freq: Two times a day (BID) | ORAL | Status: DC
  Administered 2023-12-28: 237 mL via ORAL

## 2023-12-28 NOTE — Progress Notes (Signed)
 PROGRESS NOTE    Steven [name]  ZOX:[id number] DOB: [birth year] DOA: 12/25/2023 PCP: Noberto Retort, MD   Brief Narrative:  This 53 yrs old male with PMH significant for diabetes, VTE on Xarelto, stage II adenocarcinoma status post neoadjuvant concurrent chemoradiation 2022 followed by low anterior resection and complicated postoperative course with chronic pelvic abscess now being admitted to the hospital under surgical service due to acute blood loss anemia from perirectal wound.  Hospitalist consultation has been requested to assist with medical management, primarily for his diabetes.  He was recently admitted overnight by the surgical service and discharged home on 12/17/2023 after surgical debridement of his abdominal complete prostatectomy wound.  He has continued to take Xarelto for his history of VTE, last dose last evening 3/27.  This morning, he started having bright red blood from his rectal wound.  Workup showed severe acute blood loss anemia.  Patient has been admitted to the hospital by surgical service, 2 units of blood are being transfused currently and hospitalist consultation was requested for medical management.   Assessment & Plan:   Principal Problem:   Anemia due to acute blood loss  Acute blood loss anemia: Hold Xarelto, status post 2 unit PRBC.  Hemoglobin improved to 7.6. Continue to monitor H&H.  Transfuse if Hb below hemoglobin 7.0 Hb dropped to 6.6  > s/p Transfuse 1 Unit PRBC Hb improved 8.3   Type 2 diabetes: Continue carb modified diet, Regular insulin sliding scale. Obtain hemoglobin A1c. If FS continue to go up, consider staring Lantus 5 units daily   History of VTE: Hold Xarelto due to acute bleeding.   Leukocytosis: In the absence of infectious symptoms, Suspect reactive given his recent surgical debridement and acute bleeding.   Continue IV Zosyn as per surgical team. He spiked fever, improved with tylenol.   Thank you for involving [place] in the  care of your patient.   [facility] will continue to follow along with you.    DVT prophylaxis: SCDs Code Status: Full code Family Communication: No family at bed side. Disposition Plan:  He remains inpatient due to severity of illness. We were called for medical management.    Procedures:   Antimicrobials:  Anti-infectives (From admission, onward)    Start     Dose/Rate Route Frequency Ordered Stop   12/25/23 1800  piperacillin-tazobactam (ZOSYN) IVPB 3.375 g       Note to Pharmacy: Pharmacy to check dosing >> approved by [facility]   3.375 g 12.5 mL/hr over 240 Minutes Intravenous Every 8 hours 12/25/23 1713         Subjective: Patient was seen and examined at bedside. Overnight events noted. Patient reports pain is reasonably controlled. He denies any more bleeding, tolerated dressing yesterday.  Objective: Vitals:   12/28/23 0800 12/28/23 0900 12/28/23 1000 12/28/23 1100  BP: 118/64 119/66 121/63 131/89  Pulse: 97 100 96 (!) 107  Resp: 12 14 17 13   Temp: 98.8 F (37.1 C)     TempSrc: Oral     SpO2: 94% 96% 95% 98%  Weight:      Height:        Intake/Output Summary (Last 24 hours) at 12/28/2023 1146 Last data filed at 12/28/2023 8119 Gross per 24 hour  Intake 759.73 ml  Output 2250 ml  Net -1490.27 ml   Filed Weights   12/25/23 0745 12/27/23 0100  Weight: 85.3 kg 81.5 kg    Examination:  General exam: Appears calm and comfortable, not in any acute distress.  Respiratory system: CTA Bilaterally. Respiratory effort normal.  RR 14 Cardiovascular system: S1 & S2 heard, RRR. No JVD, murmurs, rubs, gallops or clicks.  Gastrointestinal system: Abdomen is non distended, soft and non tender. Normal bowel sounds heard. Central nervous system: Alert and oriented x 3. No focal neurological deficits. Extremities: No edema, no cyanosis, no clubbing. Skin: Dressing noted in the perirectal area.  No rashes, lesions or ulcers Psychiatry: Judgement and insight appear normal. Mood &  affect appropriate.     Data Reviewed: I have personally reviewed following labs and imaging studies  CBC: Recent Labs  Lab 12/25/23 0758 12/25/23 1652 12/26/23 0025 12/27/23 0255 12/28/23 0253  WBC 18.1*  --  9.9 10.6* 15.8*  NEUTROABS 14.5*  --   --   --   --   HGB 5.8* 6.3* 7.6* 6.6* 8.3*  HCT 20.5* 20.8* 24.3* 21.0* 27.1*  MCV 70.0*  --  77.9* 75.5* 80.7  PLT 664*  --  475* 462* 462*   Basic Metabolic Panel: Recent Labs  Lab 12/25/23 0758 12/26/23 0025  NA 135 137  K 4.6 4.1  CL 106 109  CO2 21* 22  GLUCOSE 328* 161*  BUN 27* 21*  CREATININE 0.97 0.88  CALCIUM 8.2* 7.6*   GFR: Estimated Creatinine Clearance: 101.4 mL/min (by C-G formula based on SCr of 0.88 mg/dL). Liver Function Tests: Recent Labs  Lab 12/25/23 0758 12/26/23 0025  AST 12* 11*  ALT 10 10  ALKPHOS 88 76  BILITOT 0.2 0.8  PROT 5.9* 5.4*  ALBUMIN 1.7* 1.7*   No results for input(s): "LIPASE", "AMYLASE" in the last 168 hours. No results for input(s): "AMMONIA" in the last 168 hours. Coagulation Profile: Recent Labs  Lab 12/25/23 0758  INR 2.8*   Cardiac Enzymes: No results for input(s): "CKTOTAL", "CKMB", "CKMBINDEX", "TROPONINI" in the last 168 hours. BNP (last 3 results) No results for input(s): "PROBNP" in the last 8760 hours. HbA1C: No results for input(s): "HGBA1C" in the last 72 hours. CBG: Recent Labs  Lab 12/27/23 0815 12/27/23 1152 12/27/23 1559 12/27/23 2110 12/28/23 0751  GLUCAP 159* 129* 204* 262* 211*   Lipid Profile: No results for input(s): "CHOL", "HDL", "LDLCALC", "TRIG", "CHOLHDL", "LDLDIRECT" in the last 72 hours. Thyroid Function Tests: No results for input(s): "TSH", "T4TOTAL", "FREET4", "T3FREE", "THYROIDAB" in the last 72 hours. Anemia Panel: No results for input(s): "VITAMINB12", "FOLATE", "FERRITIN", "TIBC", "IRON", "RETICCTPCT" in the last 72 hours. Sepsis Labs: No results for input(s): "PROCALCITON", "LATICACIDVEN" in the last 168  hours.  Recent Results (from the past 240 hours)  MRSA Next Gen by PCR, Nasal     Status: None   Collection Time: 12/25/23  4:38 PM   Specimen: Nasal Mucosa; Nasal Swab  Result Value Ref Range Status   MRSA by PCR Next Gen NOT DETECTED NOT DETECTED Final    Comment: (NOTE) The GeneXpert MRSA Assay (FDA approved for NASAL specimens only), is one component of a comprehensive MRSA colonization surveillance program. It is not intended to diagnose MRSA infection nor to guide or monitor treatment for MRSA infections. Test performance is not FDA approved in patients less than 46 years old. Performed at [facility], [address], [place], [place] [address]    Radiology Studies: No results found.  Scheduled Meds:  sodium chloride   Intravenous Once   sodium chloride   Intravenous Once   sodium chloride   Intravenous Once   sodium chloride   Intravenous Once   carbamide peroxide  5 drop Both EARS BID   Chlorhexidine Gluconate Cloth  6 each Topical Daily   docusate sodium  100 mg Oral BID   feeding supplement (GLUCERNA SHAKE)  237 mL Oral TID BM   insulin aspart  0-15 Units Subcutaneous TID WC   insulin aspart  0-5 Units Subcutaneous QHS   magic mouthwash w/lidocaine  10 mL Oral TID   Continuous Infusions:  piperacillin-tazobactam (ZOSYN)  IV 3.375 g (12/28/23 1024)     LOS: 3 days    Time spent: 35 mins    Willeen Niece, MD [facility]   If 7PM-7AM, please contact night-coverage

## 2023-12-28 NOTE — Plan of Care (Signed)
  Problem: Coping: Goal: Ability to adjust to condition or change in health will improve Outcome: Progressing   Problem: Metabolic: Goal: Ability to maintain appropriate glucose levels will improve Outcome: Progressing   Problem: Nutritional: Goal: Maintenance of adequate nutrition will improve Outcome: Progressing   Problem: Health Behavior/Discharge Planning: Goal: Ability to manage health-related needs will improve Outcome: Progressing

## 2023-12-28 NOTE — Progress Notes (Signed)
 3 Days Post-Op   Subjective/Chief Complaint: No issues from overnight  Hgb stable this morning.  .  Objective: Vital signs in last 24 hours: Temp:  [98.6 F (37 C)-99.6 F (37.6 C)] 98.8 F (37.1 C) (03/31 0800) Pulse Rate:  [92-118] 100 (03/31 0900) Resp:  [10-19] 14 (03/31 0900) BP: (114-139)/(55-101) 119/66 (03/31 0900) SpO2:  [93 %-98 %] 96 % (03/31 0900) Last BM Date : 12/28/23  Intake/Output from previous day: 03/30 0701 - 03/31 0700 In: 1134.7 [P.O.:610; I.V.:10; Blood:352.5; IV Piggyback:162.2] Out: 2350 [Urine:2350] Intake/Output this shift: No intake/output data recorded.   NAD Abd - soft, NT;  Ostomy functioning Perineal wound - will change dressing later today  Lab Results:  Recent Labs    12/27/23 0255 12/28/23 0253  WBC 10.6* 15.8*  HGB 6.6* 8.3*  HCT 21.0* 27.1*  PLT 462* 462*   BMET Recent Labs    12/26/23 0025  NA 137  K 4.1  CL 109  CO2 22  GLUCOSE 161*  BUN 21*  CREATININE 0.88  CALCIUM 7.6*   PT/INR No results for input(s): "LABPROT", "INR" in the last 72 hours.    Anti-infectives: Anti-infectives (From admission, onward)    Start     Dose/Rate Route Frequency Ordered Stop   12/25/23 1800  piperacillin-tazobactam (ZOSYN) IVPB 3.375 g       Note to Pharmacy: Pharmacy to check dosing >> approved by [facility]   3.375 g 12.5 mL/hr over 240 Minutes Intravenous Every 8 hours 12/25/23 1713         Assessment/Plan: S/p LAR s/p APR with wound necrosis S/p EUA for bleeding from the perineal wound 12/25/23  Currently, no active bleeding Continue daily dressing changes    Transfuse as needed. Hold anticoagulation until Hgb stable Discussed with Dr Al Pimple.  Can switch to SQ Lovenox but would need a longer risks vs benefits discussion before taking off all anticoagulation right now.     LOS: 3 days    Vanita Panda 12/28/2023

## 2023-12-28 NOTE — Plan of Care (Signed)
  Problem: Education: Goal: Ability to describe self-care measures that may prevent or decrease complications (Diabetes Survival Skills Education) will improve Outcome: Progressing   Problem: Coping: Goal: Ability to adjust to condition or change in health will improve Outcome: Progressing   Problem: Fluid Volume: Goal: Ability to maintain a balanced intake and output will improve Outcome: Progressing   Problem: Health Behavior/Discharge Planning: Goal: Ability to identify and utilize available resources and services will improve Outcome: Progressing Goal: Ability to manage health-related needs will improve Outcome: Progressing   Problem: Metabolic: Goal: Ability to maintain appropriate glucose levels will improve Outcome: Progressing   Problem: Nutritional: Goal: Maintenance of adequate nutrition will improve Outcome: Progressing Goal: Progress toward achieving an optimal weight will improve Outcome: Progressing   Problem: Skin Integrity: Goal: Risk for impaired skin integrity will decrease Outcome: Progressing   Problem: Tissue Perfusion: Goal: Adequacy of tissue perfusion will improve Outcome: Progressing   Problem: Education: Goal: Knowledge of General Education information will improve Description: Including pain rating scale, medication(s)/side effects and non-pharmacologic comfort measures Outcome: Progressing   Problem: Health Behavior/Discharge Planning: Goal: Ability to manage health-related needs will improve Outcome: Progressing   Problem: Clinical Measurements: Goal: Ability to maintain clinical measurements within normal limits will improve Outcome: Progressing Goal: Will remain free from infection Outcome: Progressing Goal: Diagnostic test results will improve Outcome: Progressing Goal: Respiratory complications will improve Outcome: Progressing Goal: Cardiovascular complication will be avoided Outcome: Progressing   Problem: Activity: Goal:  Risk for activity intolerance will decrease Outcome: Progressing   Problem: Nutrition: Goal: Adequate nutrition will be maintained Outcome: Progressing   Problem: Coping: Goal: Level of anxiety will decrease Outcome: Progressing   Problem: Elimination: Goal: Will not experience complications related to bowel motility Outcome: Progressing Goal: Will not experience complications related to urinary retention Outcome: Progressing   Problem: Pain Managment: Goal: General experience of comfort will improve and/or be controlled Outcome: Progressing   Problem: Safety: Goal: Ability to remain free from injury will improve Outcome: Progressing   Problem: Skin Integrity: Goal: Risk for impaired skin integrity will decrease Outcome: Progressing   Cindy S. Clelia Croft BSN, RN, [employer], CCRN 12/28/2023 3:03 AM

## 2023-12-29 ENCOUNTER — Encounter (HOSPITAL_COMMUNITY): Payer: Self-pay

## 2023-12-29 ENCOUNTER — Inpatient Hospital Stay (HOSPITAL_COMMUNITY)

## 2023-12-29 DIAGNOSIS — D62 Acute posthemorrhagic anemia: Secondary | ICD-10-CM | POA: Diagnosis not present

## 2023-12-29 LAB — GLUCOSE, CAPILLARY
Glucose-Capillary: 245 mg/dL — ABNORMAL HIGH (ref 70–99)
Glucose-Capillary: 197 mg/dL — ABNORMAL HIGH (ref 70–99)
Glucose-Capillary: 256 mg/dL — ABNORMAL HIGH (ref 70–99)
Glucose-Capillary: 309 mg/dL — ABNORMAL HIGH (ref 70–99)

## 2023-12-29 LAB — PROTIME-INR
INR: 1.2 (ref 0.8–1.2)
Prothrombin Time: 15.5 s — ABNORMAL HIGH (ref 11.4–15.2)

## 2023-12-29 LAB — CBC
HCT: 25.8 % — ABNORMAL LOW (ref 39.0–52.0)
HCT: 26 % — ABNORMAL LOW (ref 39.0–52.0)
Hemoglobin: 8 g/dL — ABNORMAL LOW (ref 13.0–17.0)
Hemoglobin: 8 g/dL — ABNORMAL LOW (ref 13.0–17.0)
MCH: 24.6 pg — ABNORMAL LOW (ref 26.0–34.0)
MCH: 24.5 pg — ABNORMAL LOW (ref 26.0–34.0)
MCHC: 31 g/dL (ref 30.0–36.0)
MCHC: 30.8 g/dL (ref 30.0–36.0)
MCV: 79.4 fL — ABNORMAL LOW (ref 80.0–100.0)
MCV: 79.5 fL — ABNORMAL LOW (ref 80.0–100.0)
Platelets: 516 10*3/uL — ABNORMAL HIGH (ref 150–400)
Platelets: 733 10*3/uL — ABNORMAL HIGH (ref 150–400)
RBC: 3.25 MIL/uL — ABNORMAL LOW (ref 4.22–5.81)
RBC: 3.27 MIL/uL — ABNORMAL LOW (ref 4.22–5.81)
RDW: 24.1 % — ABNORMAL HIGH (ref 11.5–15.5)
RDW: 24.3 % — ABNORMAL HIGH (ref 11.5–15.5)
WBC: 19.5 10*3/uL — ABNORMAL HIGH (ref 4.0–10.5)
WBC: 34.8 10*3/uL — ABNORMAL HIGH (ref 4.0–10.5)
nRBC: 0 % (ref 0.0–0.2)
nRBC: 0.1 % (ref 0.0–0.2)

## 2023-12-29 LAB — URINALYSIS, ROUTINE W REFLEX MICROSCOPIC
Bilirubin Urine: NEGATIVE
Glucose, UA: >=500 mg/dL — AB
Hgb urine dipstick: NEGATIVE
Ketones, ur: NEGATIVE mg/dL
Nitrite: NEGATIVE
Protein, ur: NEGATIVE mg/dL
Specific Gravity, Urine: 1.026 (ref 1.005–1.030)
pH: 5 (ref 5.0–8.0)

## 2023-12-29 LAB — HEMOGLOBIN AND HEMATOCRIT, BLOOD
HCT: 28.6 % — ABNORMAL LOW (ref 39.0–52.0)
Hemoglobin: 9.1 g/dL — ABNORMAL LOW (ref 13.0–17.0)

## 2023-12-29 LAB — PREPARE RBC (CROSSMATCH)

## 2023-12-29 MED ORDER — LACTATED RINGERS IV BOLUS
1000.0000 mL | Freq: Once | INTRAVENOUS | Status: AC
  Administered 2023-12-29: 1000 mL via INTRAVENOUS

## 2023-12-29 MED ORDER — METHYLPREDNISOLONE SODIUM SUCC 40 MG IJ SOLR
40.0000 mg | Freq: Once | INTRAMUSCULAR | Status: AC
  Administered 2023-12-29: 40 mg via INTRAVENOUS
  Filled 2023-12-29: qty 1

## 2023-12-29 MED ORDER — OXIDIZED CELLULOSE EX PADS
1.0000 | MEDICATED_PAD | Freq: Once | CUTANEOUS | Status: AC
  Administered 2023-12-29: 1 via TOPICAL
  Filled 2023-12-29: qty 1

## 2023-12-29 MED ORDER — SODIUM CHLORIDE 0.9% IV SOLUTION: Freq: Once | INTRAVENOUS | Status: AC

## 2023-12-29 MED ORDER — METHYLPREDNISOLONE SODIUM SUCC 125 MG IJ SOLR: 125.0000 mg | Freq: Once | INTRAMUSCULAR | Status: DC

## 2023-12-29 MED ORDER — CHLORHEXIDINE GLUCONATE CLOTH 2 % EX PADS
6.0000 | MEDICATED_PAD | Freq: Every day | CUTANEOUS | Status: DC
  Administered 2023-12-29 – 2024-01-12 (×12): 6 via TOPICAL

## 2023-12-29 NOTE — Plan of Care (Signed)
  Problem: Education: Goal: Ability to describe self-care measures that may prevent or decrease complications (Diabetes Survival Skills Education) will improve Outcome: Progressing   Problem: Coping: Goal: Ability to adjust to condition or change in health will improve Outcome: Progressing   Problem: Fluid Volume: Goal: Ability to maintain a balanced intake and output will improve Outcome: Progressing   Problem: Health Behavior/Discharge Planning: Goal: Ability to identify and utilize available resources and services will improve Outcome: Progressing Goal: Ability to manage health-related needs will improve Outcome: Progressing   Problem: Metabolic: Goal: Ability to maintain appropriate glucose levels will improve Outcome: Progressing   Problem: Nutritional: Goal: Maintenance of adequate nutrition will improve Outcome: Progressing Goal: Progress toward achieving an optimal weight will improve Outcome: Progressing   Problem: Skin Integrity: Goal: Risk for impaired skin integrity will decrease Outcome: Progressing   Problem: Tissue Perfusion: Goal: Adequacy of tissue perfusion will improve Outcome: Progressing   Problem: Education: Goal: Knowledge of General Education information will improve Description: Including pain rating scale, medication(s)/side effects and non-pharmacologic comfort measures Outcome: Progressing   Problem: Health Behavior/Discharge Planning: Goal: Ability to manage health-related needs will improve Outcome: Progressing   Problem: Clinical Measurements: Goal: Ability to maintain clinical measurements within normal limits will improve Outcome: Progressing Goal: Will remain free from infection Outcome: Progressing Goal: Diagnostic test results will improve Outcome: Progressing Goal: Respiratory complications will improve Outcome: Progressing Goal: Cardiovascular complication will be avoided Outcome: Progressing   Problem: Activity: Goal:  Risk for activity intolerance will decrease Outcome: Progressing   Problem: Nutrition: Goal: Adequate nutrition will be maintained Outcome: Progressing   Problem: Coping: Goal: Level of anxiety will decrease Outcome: Progressing   Problem: Elimination: Goal: Will not experience complications related to bowel motility Outcome: Progressing Goal: Will not experience complications related to urinary retention Outcome: Progressing   Problem: Pain Managment: Goal: General experience of comfort will improve and/or be controlled Outcome: Progressing   Problem: Safety: Goal: Ability to remain free from injury will improve Outcome: Progressing   Problem: Skin Integrity: Goal: Risk for impaired skin integrity will decrease Outcome: Progressing   Cindy S. Clelia Croft BSN, RN, CCRP, CCRN 12/29/2023 3:55 AM

## 2023-12-29 NOTE — Progress Notes (Addendum)
 Pt complaining of itching eyes while receiving FFP and oral contrast. This RN noted eyelids becoming swollen. Attending paged. Benadryl given. Pt then began coughing and feeling like he's wheezing. Surgery paged again.

## 2023-12-29 NOTE — Progress Notes (Addendum)
 Notified at 05:30 of large volume bleeding from perineal wound with associated hypotension. At the time a stat CBC was ordered, fluid bolus 1L ordered/ given, and nursing staff asked to pack the wound with quickclot (not available- surgicel and kerlix used).    CBC with stable hgb (but collected just after bleeding noted), and WBC increased to 34.8 from 19.5.   Ongoing low BP and tachycardia, pain with movement/eval of wound.  Patient evaluated at bedside and noted slight improvement in BP with mild trendelenburg positioning. Wound with clot at base, bleeding slowed significantly with pressure dressing that had been applied. Malodorous and tracks fairly high up in the pelvis consistent with ongoing necrosis.   New kerlix pressure dressing applied, leaving surgicel in place. Given reported volume of blood loss and ongoing hypotension will transfuse 2uPRBC. Patient already on zosyn. Will order CT to evaluate for intraabdominal source but suspect wound necrosis is driving the bleeding and WBC.  Patient discussed with Dr Maisie Fus who will evaluate this morning. Unfortunately at this point I'm not sure much can be done surgically for his perineal wound.

## 2023-12-29 NOTE — Progress Notes (Signed)
 4 Days Post-Op   Subjective/Chief Complaint: Hypotension and bleeding overnight. Giving pRBCs now.    Objective: Vital signs in last 24 hours: Temp:  [98.6 F (37 C)-100.4 F (38 C)] 98.6 F (37 C) (04/01 0802) Pulse Rate:  [96-138] 114 (04/01 0802) Resp:  [13-23] 18 (04/01 0802) BP: (78-153)/(37-89) 91/56 (04/01 0802) SpO2:  [93 %-100 %] 100 % (04/01 0802) Last BM Date : 12/28/23  Intake/Output from previous day: 03/31 0701 - 04/01 0700 In: 1355.4 [P.O.:240; IV Piggyback:1115.4] Out: 2501 [Urine:2500; Stool:1] Intake/Output this shift: No intake/output data recorded.   NAD Abd - soft, NT;  Ostomy functioning Perineal wound - still with necrosis posteriorly despite debridements, no active bleeding on dressing   Lab Results:  Recent Labs    12/29/23 0323 12/29/23 0546  WBC 19.5* 34.8*  HGB 8.0* 8.0*  HCT 25.8* 26.0*  PLT 516* 733*   BMET No results for input(s): "NA", "K", "CL", "CO2", "GLUCOSE", "BUN", "CREATININE", "CALCIUM" in the last 72 hours.  PT/INR No results for input(s): "LABPROT", "INR" in the last 72 hours.    Anti-infectives: Anti-infectives (From admission, onward)    Start     Dose/Rate Route Frequency Ordered Stop   12/25/23 1800  piperacillin-tazobactam (ZOSYN) IVPB 3.375 g       Note to Pharmacy: Pharmacy to check dosing >> approved by [facility]   3.375 g 12.5 mL/hr over 240 Minutes Intravenous Every 8 hours 12/25/23 1713         Assessment/Plan: S/p LAR s/p APR with ongoing wound necrosis S/p EUA for bleeding from the perineal wound 12/25/23  Keep dressing in place today.  Transfuse as needed. Hold anticoagulation until Hgb stable Wbc elevated: cont empiric Zosyn Will get CT abd/pelvis to eval for any active bleeding or abd source of infection but suspect elevated wbc is due to ongoing pelvic necrosis   LOS: 4 days    Vanita Panda 12/29/2023

## 2023-12-29 NOTE — Progress Notes (Signed)
 PROGRESS NOTE    Steven [name]  AYT:[id number] DOB: 19-Sep-1971 DOA: 12/25/2023 PCP: Noberto Retort, MD   Brief Narrative:  This 53 yrs old male with PMH significant for diabetes, VTE on Xarelto, stage II adenocarcinoma status post neoadjuvant concurrent chemoradiation 2022 followed by low anterior resection and complicated postoperative course with chronic pelvic abscess now being admitted to the hospital under surgical service due to acute blood loss anemia from perirectal wound.  Hospitalist consultation has been requested to assist with medical management, primarily for his diabetes.  He was recently admitted overnight by the surgical service and discharged home on 12/17/2023 after surgical debridement of his abdominal complete prostatectomy wound.  He has continued to take Xarelto for his history of VTE, last dose last evening 3/27.  This morning, he started having bright red blood from his rectal wound.  Workup showed severe acute blood loss anemia.  Patient has been admitted to the hospital by surgical service, 2 units of blood are being transfused currently and hospitalist consultation was requested for medical management.   Assessment & Plan:   Principal Problem:   Anemia due to acute blood loss  Acute blood loss anemia: Hold Xarelto, status post 2 unit PRBC.  Hemoglobin improved to 7.6. Continue to monitor H&H.  Transfuse if Hb below hemoglobin 7.0 3/30 : Hb dropped to 6.6  > s/p Transfuse 1 Unit PRBC Hb improved 8.3 4/1: Patient was noted to have profuse bleeding from perineal wound with hypotension. Status post fluid resuscitation.  Wound was packed with quick clot.  Status post 1 unit PRBC Monitor H&H.  Obtain CT A/P to evaluate for any active bleeding or abdominal source of infection.    Type 2 diabetes: Continue carb modified diet, Regular insulin sliding scale. If FS continue to go up, consider staring Lantus 5 units daily.   History of VTE: Hold Xarelto due to acute  bleeding.   Leukocytosis: In the absence of infectious symptoms, Suspect reactive given his recent surgical debridement and acute bleeding.   Continue IV Zosyn as per surgical team.    Thank you for involving [place] in the care of your patient.   [facility] will continue to follow along with you.  DVT prophylaxis: SCDs Code Status: Full code Family Communication: Wife at bed side. Disposition Plan:  He remains inpatient due to severity of illness. We were called for medical management.  Procedures:   Antimicrobials:  Anti-infectives (From admission, onward)    Start     Dose/Rate Route Frequency Ordered Stop   12/25/23 1800  piperacillin-tazobactam (ZOSYN) IVPB 3.375 g       Note to Pharmacy: Pharmacy to check dosing >> approved by [facility]   3.375 g 12.5 mL/hr over 240 Minutes Intravenous Every 8 hours 12/25/23 1713         Subjective: Patient was seen and examined at bedside. Overnight events noted. Patient appears pale, bleeding has stopped at this point. He was getting blood transfusion. He appears anxious.  Objective: Vitals:   12/29/23 1004 12/29/23 1045 12/29/23 1309 12/29/23 1325  BP: 104/63 110/62 (!) 106/57 (!) 106/56  Pulse: (!) 105 (!) 110 (!) 105 (!) 103  Resp: 18 12 17 17   Temp: (!) 97.2 F (36.2 C) 98.3 F (36.8 C) 98.6 F (37 C) 98.4 F (36.9 C)  TempSrc: Axillary Oral Oral Oral  SpO2: 97% 98% 96% 95%  Weight:      Height:        Intake/Output Summary (Last 24 hours) at 12/29/2023  1344 Last data filed at 12/29/2023 1045 Gross per 24 hour  Intake 1817.08 ml  Output 1851 ml  Net -33.92 ml   Filed Weights   12/25/23 0745 12/27/23 0100  Weight: 85.3 kg 81.5 kg    Examination:  General exam: Appears calm and comfortable, not in any acute distress. Respiratory system: CTA Bilaterally. Respiratory effort normal.  RR 13 Cardiovascular system: S1 & S2 heard, RRR. No JVD, murmurs, rubs, gallops or clicks.  Gastrointestinal system: Abdomen is non distended, soft  and non tender. Normal bowel sounds heard. Central nervous system: Alert and oriented x 3. No focal neurological deficits. Extremities: No edema, no cyanosis, no clubbing. Skin: Dressing noted in the perirectal area.  No rashes, lesions or ulcers Psychiatry: Judgement and insight appear normal. Mood & affect appropriate.    Data Reviewed: I have personally reviewed following labs and imaging studies  CBC: Recent Labs  Lab 12/25/23 0758 12/25/23 1652 12/26/23 0025 12/27/23 0255 12/28/23 0253 12/29/23 0323 12/29/23 0546  WBC 18.1*  --  9.9 10.6* 15.8* 19.5* 34.8*  NEUTROABS 14.5*  --   --   --   --   --   --   HGB 5.8*   < > 7.6* 6.6* 8.3* 8.0* 8.0*  HCT 20.5*   < > 24.3* 21.0* 27.1* 25.8* 26.0*  MCV 70.0*  --  77.9* 75.5* 80.7 79.4* 79.5*  PLT 664*  --  475* 462* 462* 516* 733*   < > = values in this interval not displayed.   Basic Metabolic Panel: Recent Labs  Lab 12/25/23 0758 12/26/23 0025  NA 135 137  K 4.6 4.1  CL 106 109  CO2 21* 22  GLUCOSE 328* 161*  BUN 27* 21*  CREATININE 0.97 0.88  CALCIUM 8.2* 7.6*   GFR: Estimated Creatinine Clearance: 101.4 mL/min (by C-G formula based on SCr of 0.88 mg/dL). Liver Function Tests: Recent Labs  Lab 12/25/23 0758 12/26/23 0025  AST 12* 11*  ALT 10 10  ALKPHOS 88 76  BILITOT 0.2 0.8  PROT 5.9* 5.4*  ALBUMIN 1.7* 1.7*   No results for input(s): "LIPASE", "AMYLASE" in the last 168 hours. No results for input(s): "AMMONIA" in the last 168 hours. Coagulation Profile: Recent Labs  Lab 12/25/23 0758  INR 2.8*   Cardiac Enzymes: No results for input(s): "CKTOTAL", "CKMB", "CKMBINDEX", "TROPONINI" in the last 168 hours. BNP (last 3 results) No results for input(s): "PROBNP" in the last 8760 hours. HbA1C: Recent Labs    12/28/23 0253  HGBA1C 7.5*   CBG: Recent Labs  Lab 12/28/23 1323 12/28/23 1616 12/28/23 2148 12/29/23 0749 12/29/23 1130  GLUCAP 210* 214* 245* 245* 197*   Lipid Profile: No results  for input(s): "CHOL", "HDL", "LDLCALC", "TRIG", "CHOLHDL", "LDLDIRECT" in the last 72 hours. Thyroid Function Tests: No results for input(s): "TSH", "T4TOTAL", "FREET4", "T3FREE", "THYROIDAB" in the last 72 hours. Anemia Panel: No results for input(s): "VITAMINB12", "FOLATE", "FERRITIN", "TIBC", "IRON", "RETICCTPCT" in the last 72 hours. Sepsis Labs: No results for input(s): "PROCALCITON", "LATICACIDVEN" in the last 168 hours.  Recent Results (from the past 240 hours)  MRSA Next Gen by PCR, Nasal     Status: None   Collection Time: 12/25/23  4:38 PM   Specimen: Nasal Mucosa; Nasal Swab  Result Value Ref Range Status   MRSA by PCR Next Gen NOT DETECTED NOT DETECTED Final    Comment: (NOTE) The GeneXpert MRSA Assay (FDA approved for NASAL specimens only), is one component of a comprehensive MRSA  colonization surveillance program. It is not intended to diagnose MRSA infection nor to guide or monitor treatment for MRSA infections. Test performance is not FDA approved in patients less than 18 years old. Performed at [facility], [address] [place], [place] [address]    Radiology Studies: No results found.  Scheduled Meds:  carbamide peroxide  5 drop Both EARS BID   Chlorhexidine Gluconate Cloth  6 each Topical Q2200   docusate sodium  100 mg Oral BID   feeding supplement (GLUCERNA SHAKE)  237 mL Oral TID BM   insulin aspart  0-15 Units Subcutaneous TID WC   insulin aspart  0-5 Units Subcutaneous QHS   magic mouthwash w/lidocaine  10 mL Oral TID   Continuous Infusions:  piperacillin-tazobactam (ZOSYN)  IV 3.375 g (12/29/23 0930)     LOS: 4 days    Time spent: 35 mins    Willeen Niece, MD [facility]   If 7PM-7AM, please contact night-coverage

## 2023-12-29 NOTE — Significant Event (Signed)
 Large volume blood from sacrum with clot. Notified Dr. Fredricka Bonine (on call for general surgery). Hospitalist NP Chinita Greenland also at bedside. Patient is pale, hypotensive and alert/oriented x 4. Received orders for CBC, TYPE/SCREEN, LR 1L bolus STAT, NPO NOW, apply "quick clot" surgical dressing to help stop bleeding. Dr. Fredricka Bonine advised to call back if condition worsens, but she will notify attending surgeon Dr. Maisie Fus as patient may need to return to OR. Patient requested we notify wife. Called and provided update. She asks staff to keep her informed and she will come to hospital as soon as can get children to school.  Lamona Curl, RN

## 2023-12-29 NOTE — Progress Notes (Addendum)
       Overnight   NAME: Steven [name] MRN: [id number] DOB : Mar 11, 1971    Date of Service   12/29/2023   HPI/Events of Note    Rapid response while present in ICU.  Brief history 53 year old male with PMH T3BN0 rectal adenocarcinoma diagnosed 2022 s/p chemoradiation followed by robotic LAR, ileostomy 07/17/2021 by Dr. Maisie Fus. Postop course complicated by a anterior midline anastomotic dehiscence.  Current event: Notified by RN for "large volume of blood".  Bedside visit Patient awake, alert, and oriented with greater than 1 L (+) of red blood present from lumbar area down to knees.  There is a semi-formed gelatinous clot approximately the size of a 500 cc saline bag, with nonclotted red blood in the remainder of the area.  While staff rolled patient, there is continued volumes of blood in the entire lumbar area(any area of the dressing, including the entire dressing saturation)  There is another clot inside the wound with the internal packing approximately the size of a deck of playing cards.  Currently nursing staff is notifying [name] attending. Stat CBC is ordered  In coordination with Dr. Mariea Stable), CBC stat (pending), type and screen ordered(stat-pending),  1 L fluid bolus (in process)  Surgicel pad into the wound at any obvious area - (in process.)  Abdomen is soft Flanks are soft  Heart rate - 127(with activity)  Respiratory rate - 20 SpO2 - 100  BP 90/51  Patient is awake and oriented.   Interventions/ Plan   Type and screen-pending CBC stat-pending 1 L lactated Ringer's bolus-in process Surgicel to wound area-in process       Update 0604 hrs  Immediate replacement sacral dressing became saturated and was replaced. Surgicel is in place. Current BP with bolus infusing is 96/45 (62) Heart rate - 132 Respiratory rate -14 SpO2 - 100%   Latest Reference Range & Units 12/28/23 02:53 12/29/23 03:23 12/29/23 05:46  WBC 4.0 - 10.5 K/uL 15.8 (H) 19.5 (H)  34.8 (H)  RBC 4.22 - 5.81 MIL/uL 3.36 (L) 3.25 (L) 3.27 (L)  Hemoglobin 13.0 - 17.0 g/dL 8.3 (L) 8.0 (L) 8.0 (L)  HCT 39.0 - 52.0 % 27.1 (L) 25.8 (L) 26.0 (L)  MCV 80.0 - 100.0 fL 80.7 79.4 (L) 79.5 (L)  MCH 26.0 - 34.0 pg 24.7 (L) 24.6 (L) 24.5 (L)  MCHC 30.0 - 36.0 g/dL 40.9 81.1 91.4  RDW 78.2 - 15.5 % 23.7 (H) 24.1 (H) 24.3 (H)  Platelets 150 - 400 K/uL 462 (H) 516 (H) 733 (H)  nRBC 0.0 - 0.2 % 0.0 0.0 0.1  (H): Data is abnormally high (L): Data is abnormally low   Chinita Greenland BSN MSNA MSN ACNPC-AG Acute Care Nurse Practitioner [facility] [place]

## 2023-12-30 ENCOUNTER — Inpatient Hospital Stay (HOSPITAL_COMMUNITY): Admitting: Certified Registered"

## 2023-12-30 ENCOUNTER — Encounter (HOSPITAL_COMMUNITY): Payer: Self-pay

## 2023-12-30 ENCOUNTER — Encounter (HOSPITAL_COMMUNITY): Admission: EM | Disposition: A | Discharge status: Skilled Nursing Facility | Payer: Self-pay | Source: Home / Self Care

## 2023-12-30 ENCOUNTER — Other Ambulatory Visit: Payer: Self-pay

## 2023-12-30 DIAGNOSIS — D62 Acute posthemorrhagic anemia: Secondary | ICD-10-CM | POA: Diagnosis not present

## 2023-12-30 HISTORY — PX: APPLICATION OF WOUND VAC: SHX5189

## 2023-12-30 HISTORY — PX: IRRIGATION AND DEBRIDEMENT ABSCESS: SHX5252

## 2023-12-30 LAB — COMPREHENSIVE METABOLIC PANEL WITH GFR
ALT: 11 U/L (ref 0–44)
AST: 13 U/L — ABNORMAL LOW (ref 15–41)
Albumin: 1.6 g/dL — ABNORMAL LOW (ref 3.5–5.0)
Alkaline Phosphatase: 98 U/L (ref 38–126)
Anion gap: 10 (ref 5–15)
BUN: 27 mg/dL — ABNORMAL HIGH (ref 6–20)
CO2: 24 mmol/L (ref 22–32)
Calcium: 7.9 mg/dL — ABNORMAL LOW (ref 8.9–10.3)
Chloride: 96 mmol/L — ABNORMAL LOW (ref 98–111)
Creatinine, Ser: 0.85 mg/dL (ref 0.61–1.24)
GFR, Estimated: >60 mL/min (ref >60)
Glucose, Bld: 251 mg/dL — ABNORMAL HIGH (ref 70–99)
Potassium: 4.8 mmol/L (ref 3.5–5.1)
Sodium: 130 mmol/L — ABNORMAL LOW (ref 135–145)
Total Bilirubin: 0.5 mg/dL (ref 0.0–1.2)
Total Protein: 5.9 g/dL — ABNORMAL LOW (ref 6.5–8.1)

## 2023-12-30 LAB — BPAM FFP
Blood Product Expiration Date: 202504062359
Blood Product Expiration Date: 202504062359
ISSUE DATE / TIME: 202504010944
ISSUE DATE / TIME: 202504011259
Unit Type and Rh: 600
Unit Type and Rh: 6200

## 2023-12-30 LAB — PREPARE FRESH FROZEN PLASMA: Unit division: 0

## 2023-12-30 LAB — CBC
HCT: 24.1 % — ABNORMAL LOW (ref 39.0–52.0)
Hemoglobin: 7.9 g/dL — ABNORMAL LOW (ref 13.0–17.0)
MCH: 26.2 pg (ref 26.0–34.0)
MCHC: 32.8 g/dL (ref 30.0–36.0)
MCV: 80.1 fL (ref 80.0–100.0)
Platelets: 490 10*3/uL — ABNORMAL HIGH (ref 150–400)
RBC: 3.01 MIL/uL — ABNORMAL LOW (ref 4.22–5.81)
RDW: 22.2 % — ABNORMAL HIGH (ref 11.5–15.5)
WBC: 19.2 10*3/uL — ABNORMAL HIGH (ref 4.0–10.5)
nRBC: 0 % (ref 0.0–0.2)

## 2023-12-30 LAB — GLUCOSE, CAPILLARY
Glucose-Capillary: 223 mg/dL — ABNORMAL HIGH (ref 70–99)
Glucose-Capillary: 151 mg/dL — ABNORMAL HIGH (ref 70–99)
Glucose-Capillary: 233 mg/dL — ABNORMAL HIGH (ref 70–99)
Glucose-Capillary: 253 mg/dL — ABNORMAL HIGH (ref 70–99)

## 2023-12-30 LAB — PHOSPHORUS: Phosphorus: 3.6 mg/dL (ref 2.5–4.6)

## 2023-12-30 LAB — PROTIME-INR
INR: 1.3 — ABNORMAL HIGH (ref 0.8–1.2)
Prothrombin Time: 16 s — ABNORMAL HIGH (ref 11.4–15.2)

## 2023-12-30 LAB — MAGNESIUM: Magnesium: 2.2 mg/dL (ref 1.7–2.4)

## 2023-12-30 SURGERY — IRRIGATION AND DEBRIDEMENT ABSCESS
Anesthesia: General

## 2023-12-30 MED ORDER — ONDANSETRON HCL 4 MG/2ML IJ SOLN
INTRAMUSCULAR | Status: AC
  Filled 2023-12-30: qty 2

## 2023-12-30 MED ORDER — OXYCODONE HCL 5 MG/5ML PO SOLN: 5.0000 mg | Freq: Once | ORAL | Status: DC | PRN

## 2023-12-30 MED ORDER — BACITRACIN ZINC 500 UNIT/GM EX OINT
TOPICAL_OINTMENT | CUTANEOUS | Status: AC
  Filled 2023-12-30: qty 28.35

## 2023-12-30 MED ORDER — 0.9 % SODIUM CHLORIDE (POUR BTL) OPTIME
TOPICAL | Status: DC | PRN
  Administered 2023-12-30: 2000 mL

## 2023-12-30 MED ORDER — DROPERIDOL 2.5 MG/ML IJ SOLN: 0.6250 mg | Freq: Once | INTRAMUSCULAR | Status: DC | PRN

## 2023-12-30 MED ORDER — PROPOFOL 10 MG/ML IV BOLUS
INTRAVENOUS | Status: AC
Start: 2023-12-30 — End: ?
  Filled 2023-12-30: qty 20

## 2023-12-30 MED ORDER — ROCURONIUM BROMIDE 10 MG/ML (PF) SYRINGE
PREFILLED_SYRINGE | INTRAVENOUS | Status: AC
Start: 2023-12-30 — End: ?
  Filled 2023-12-30: qty 10

## 2023-12-30 MED ORDER — SODIUM CHLORIDE 0.9% FLUSH
10.0000 mL | Freq: Two times a day (BID) | INTRAVENOUS | Status: DC
  Administered 2023-12-30 – 2023-12-31 (×2): 10 mL
  Administered 2024-01-01: 40 mL
  Administered 2024-01-01: 20 mL
  Administered 2024-01-02: 10 mL
  Administered 2024-01-02 – 2024-01-03 (×2): 20 mL
  Administered 2024-01-03 – 2024-01-04 (×2): 10 mL
  Administered 2024-01-05 (×2): 20 mL
  Administered 2024-01-06 – 2024-01-08 (×4): 10 mL
  Administered 2024-01-08: 20 mL
  Administered 2024-01-09 – 2024-01-12 (×6): 10 mL
  Administered 2024-01-12: 20 mL
  Administered 2024-01-13: 10 mL

## 2023-12-30 MED ORDER — CHLORHEXIDINE GLUCONATE 0.12 % MT SOLN
15.0000 mL | Freq: Once | OROMUCOSAL | Status: AC
  Administered 2023-12-30: 15 mL via OROMUCOSAL

## 2023-12-30 MED ORDER — KETAMINE HCL 50 MG/5ML IJ SOSY
PREFILLED_SYRINGE | INTRAMUSCULAR | Status: DC | PRN
  Administered 2023-12-30: 20 mg via INTRAVENOUS

## 2023-12-30 MED ORDER — HYDROMORPHONE HCL 1 MG/ML IJ SOLN
INTRAMUSCULAR | Status: AC
  Filled 2023-12-30: qty 1

## 2023-12-30 MED ORDER — DEXAMETHASONE SODIUM PHOSPHATE 10 MG/ML IJ SOLN
INTRAMUSCULAR | Status: DC | PRN
  Administered 2023-12-30: 4 mg via INTRAVENOUS

## 2023-12-30 MED ORDER — KETAMINE HCL 50 MG/5ML IJ SOSY
PREFILLED_SYRINGE | INTRAMUSCULAR | Status: AC
  Filled 2023-12-30: qty 5

## 2023-12-30 MED ORDER — MIDAZOLAM HCL 2 MG/2ML IJ SOLN
INTRAMUSCULAR | Status: DC | PRN
  Administered 2023-12-30 (×2): 1 mg via INTRAVENOUS

## 2023-12-30 MED ORDER — BACITRACIN-NEOMYCIN-POLYMYXIN OINTMENT TUBE
TOPICAL_OINTMENT | CUTANEOUS | Status: AC
  Filled 2023-12-30: qty 14.17

## 2023-12-30 MED ORDER — PROPOFOL 10 MG/ML IV BOLUS
INTRAVENOUS | Status: DC | PRN
  Administered 2023-12-30: 140 mg via INTRAVENOUS

## 2023-12-30 MED ORDER — SUGAMMADEX SODIUM 200 MG/2ML IV SOLN
INTRAVENOUS | Status: DC | PRN
  Administered 2023-12-30: 200 mg via INTRAVENOUS

## 2023-12-30 MED ORDER — THIAMINE HCL 100 MG/ML IJ SOLN
100.0000 mg | Freq: Once | INTRAMUSCULAR | Status: AC
  Administered 2023-12-30: 100 mg via INTRAVENOUS
  Filled 2023-12-30: qty 2

## 2023-12-30 MED ORDER — OXYCODONE HCL 5 MG PO TABS: 5.0000 mg | ORAL_TABLET | Freq: Once | ORAL | Status: DC | PRN

## 2023-12-30 MED ORDER — HYDROMORPHONE HCL 1 MG/ML IJ SOLN
0.2500 mg | INTRAMUSCULAR | Status: DC | PRN
  Administered 2023-12-30 (×2): 0.5 mg via INTRAVENOUS

## 2023-12-30 MED ORDER — INSULIN GLARGINE-YFGN 100 UNIT/ML ~~LOC~~ SOLN
5.0000 [IU] | Freq: Every day | SUBCUTANEOUS | Status: DC
  Filled 2023-12-30 (×2): qty 0.05

## 2023-12-30 MED ORDER — INSULIN ASPART 100 UNIT/ML IJ SOLN
0.0000 [IU] | INTRAMUSCULAR | Status: DC
  Administered 2023-12-30: 5 [IU] via SUBCUTANEOUS
  Administered 2023-12-30: 8 [IU] via SUBCUTANEOUS
  Administered 2023-12-31 (×2): 5 [IU] via SUBCUTANEOUS

## 2023-12-30 MED ORDER — THIAMINE HCL 100 MG/ML IJ SOLN
100.0000 mg | INTRAMUSCULAR | Status: AC
  Administered 2023-12-31 – 2024-01-03 (×4): 100 mg via INTRAVENOUS
  Filled 2023-12-30 (×4): qty 2

## 2023-12-30 MED ORDER — LACTATED RINGERS IV SOLN: INTRAVENOUS | Status: DC

## 2023-12-30 MED ORDER — FENTANYL CITRATE (PF) 100 MCG/2ML IJ SOLN
INTRAMUSCULAR | Status: AC
  Filled 2023-12-30: qty 2

## 2023-12-30 MED ORDER — PHENYLEPHRINE HCL (PRESSORS) 10 MG/ML IV SOLN
INTRAVENOUS | Status: DC | PRN
  Administered 2023-12-30 (×4): 80 ug via INTRAVENOUS

## 2023-12-30 MED ORDER — HYDROMORPHONE HCL 1 MG/ML IJ SOLN
0.5000 mg | INTRAMUSCULAR | Status: AC | PRN
Start: 2023-12-30 — End: ?
  Administered 2023-12-30 – 2023-12-31 (×6): 1 mg via INTRAVENOUS
  Administered 2023-12-31: 0.5 mg via INTRAVENOUS
  Administered 2024-01-01 – 2024-01-05 (×26): 1 mg via INTRAVENOUS
  Filled 2023-12-30 (×35): qty 1

## 2023-12-30 MED ORDER — FENTANYL CITRATE (PF) 100 MCG/2ML IJ SOLN
INTRAMUSCULAR | Status: DC | PRN
Start: 2023-12-30 — End: 2023-12-30
  Administered 2023-12-30: 100 ug via INTRAVENOUS
  Administered 2023-12-30: 50 ug via INTRAVENOUS

## 2023-12-30 MED ORDER — TRAVASOL 10 % IV SOLN
INTRAVENOUS | Status: AC
  Filled 2023-12-30: qty 537.6

## 2023-12-30 MED ORDER — MIDAZOLAM HCL 2 MG/2ML IJ SOLN
INTRAMUSCULAR | Status: AC
  Filled 2023-12-30: qty 2

## 2023-12-30 MED ORDER — LIDOCAINE HCL (CARDIAC) PF 100 MG/5ML IV SOSY
PREFILLED_SYRINGE | INTRAVENOUS | Status: DC | PRN
  Administered 2023-12-30: 80 mg via INTRATRACHEAL

## 2023-12-30 MED ORDER — SODIUM CHLORIDE 0.9% FLUSH: 10.0000 mL | INTRAVENOUS | Status: DC | PRN

## 2023-12-30 MED ORDER — ROCURONIUM BROMIDE 100 MG/10ML IV SOLN
INTRAVENOUS | Status: DC | PRN
  Administered 2023-12-30: 50 mg via INTRAVENOUS
  Administered 2023-12-30 (×2): 10 mg via INTRAVENOUS

## 2023-12-30 MED ORDER — DEXAMETHASONE SODIUM PHOSPHATE 10 MG/ML IJ SOLN
INTRAMUSCULAR | Status: AC
  Filled 2023-12-30: qty 1

## 2023-12-30 MED ORDER — ONDANSETRON HCL 4 MG/2ML IJ SOLN
INTRAMUSCULAR | Status: DC | PRN
  Administered 2023-12-30: 4 mg via INTRAVENOUS

## 2023-12-30 MED ORDER — SODIUM CHLORIDE 0.9 % IV SOLN: INTRAVENOUS | Status: DC | PRN

## 2023-12-30 SURGICAL SUPPLY — 40 items
BAG COUNTER SPONGE SURGICOUNT (BAG) IMPLANT
BENZOIN TINCTURE PRP APPL 2/3 (GAUZE/BANDAGES/DRESSINGS) IMPLANT
BLADE HEX COATED 2.75 (ELECTRODE) ×1 IMPLANT
BLADE SURG SZ10 CARB STEEL (BLADE) ×1 IMPLANT
CANISTER WOUND CARE 500ML ATS (WOUND CARE) IMPLANT
CHLORAPREP W/TINT 26 (MISCELLANEOUS) ×1 IMPLANT
COVER SURGICAL LIGHT HANDLE (MISCELLANEOUS) ×1 IMPLANT
DRAIN CHANNEL RND F F (WOUND CARE) IMPLANT
DRAPE DERMATAC (DRAPES) IMPLANT
DRAPE LAPAROTOMY T 102X78X121 (DRAPES) IMPLANT
DRAPE LAPAROTOMY TRNSV 102X78 (DRAPES) IMPLANT
DRAPE SHEET LG 3/4 BI-LAMINATE (DRAPES) IMPLANT
DRSG VERAFLO VAC MED (GAUZE/BANDAGES/DRESSINGS) IMPLANT
DRSG VERSA FOAM LRG 10X15 (GAUZE/BANDAGES/DRESSINGS) IMPLANT
ELECT REM PT RETURN 15FT ADLT (MISCELLANEOUS) ×1 IMPLANT
EVACUATOR SILICONE 100CC (DRAIN) IMPLANT
GAUZE SPONGE 4X4 12PLY STRL (GAUZE/BANDAGES/DRESSINGS) ×1 IMPLANT
GLOVE BIOGEL PI IND STRL 7.0 (GLOVE) ×1 IMPLANT
GLOVE SURG ORTHO 8.0 STRL STRW (GLOVE) ×1 IMPLANT
GLOVE SURG SYN 7.5 E (GLOVE) ×1 IMPLANT
GLOVE SURG SYN 7.5 PF PI (GLOVE) ×1 IMPLANT
GOWN STRL REUS W/ TWL XL LVL3 (GOWN DISPOSABLE) ×2 IMPLANT
KIT BASIN OR (CUSTOM PROCEDURE TRAY) ×1 IMPLANT
KIT TURNOVER KIT A (KITS) IMPLANT
MARKER SKIN DUAL TIP RULER LAB (MISCELLANEOUS) IMPLANT
NDL HYPO 25X1 1.5 SAFETY (NEEDLE) ×1 IMPLANT
NEEDLE HYPO 25X1 1.5 SAFETY (NEEDLE) ×1 IMPLANT
NS IRRIG 1000ML POUR BTL (IV SOLUTION) ×1 IMPLANT
PACK GENERAL/GYN (CUSTOM PROCEDURE TRAY) ×1 IMPLANT
SOL PREP POV-IOD 4OZ 10% (MISCELLANEOUS) IMPLANT
SPIKE FLUID TRANSFER (MISCELLANEOUS) IMPLANT
SPONGE T-LAP 18X18 ~~LOC~~+RFID (SPONGE) IMPLANT
STAPLER SKIN PROX WIDE 3.9 (STAPLE) IMPLANT
STRIP CLOSURE SKIN 1/2X4 (GAUZE/BANDAGES/DRESSINGS) IMPLANT
SUT ETHILON 3 0 PS 1 (SUTURE) IMPLANT
SUT MNCRL AB 4-0 PS2 18 (SUTURE) IMPLANT
SUT SILK 2 0 SH CR/8 (SUTURE) IMPLANT
SUT VIC AB 3-0 SH 18 (SUTURE) IMPLANT
SYR CONTROL 10ML LL (SYRINGE) ×1 IMPLANT
TOWEL OR 17X26 10 PK STRL BLUE (TOWEL DISPOSABLE) ×1 IMPLANT

## 2023-12-30 NOTE — Anesthesia Preprocedure Evaluation (Addendum)
 Anesthesia Evaluation  Patient identified by MRN, date of birth, ID band Patient awake    Reviewed: Allergy & Precautions, NPO status , Patient's Chart, lab work & pertinent test results  History of Anesthesia Complications Negative for: history of anesthetic complications  Airway Mallampati: III  TM Distance: >3 FB Neck ROM: Full   Comment: Previous grade I view with MAC 3, easy mask Dental  (+) Dental Advisory Given   Pulmonary neg shortness of breath, asthma , sleep apnea , neg COPD, neg recent URI, PE   Pulmonary exam normal breath sounds clear to auscultation       Cardiovascular hypertension, (-) angina + Peripheral Vascular Disease  (-) Past MI, (-) Cardiac Stents and (-) CABG Normal cardiovascular exam(-) dysrhythmias  Rhythm:Regular Rate:Normal  HLD  TTE 07/06/2020: IMPRESSIONS    1. Left ventricular ejection fraction, by estimation, is 55 to 60%. The  left ventricle has normal function. The left ventricle has no regional  wall motion abnormalities. Left ventricular diastolic parameters are  consistent with Grade I diastolic  dysfunction (impaired relaxation).   2. Right ventricular systolic function is mildly reduced. The right  ventricular size is mildly enlarged. There is mildly elevated pulmonary  artery systolic pressure.   3. The mitral valve is normal in structure. No evidence of mitral valve  regurgitation. No evidence of mitral stenosis.   4. The aortic valve is grossly normal. Aortic valve regurgitation is not  visualized. No aortic stenosis is present.   5. Aortic dilatation noted. There is borderline dilatation of the  ascending aorta, measuring 38 mm.   6. The inferior vena cava is dilated in size with <50% respiratory  variability, suggesting right atrial pressure of 15 mmHg.     Neuro/Psych neg Seizures PSYCHIATRIC DISORDERS Anxiety Depression     Neuromuscular disease (peripheral neuropathy)     GI/Hepatic Neg liver ROS,GERD  ,,Rectal cancer, diverting ileostomy   Endo/Other  diabetes, Poorly Controlled, Type 2, Oral Hypoglycemic Agents    Renal/GU Renal disease     Musculoskeletal   Abdominal   Peds  Hematology  (+) Blood dyscrasia, anemia Lab Results      Component                Value               Date                      WBC                      18.1 (H)            12/25/2023                HGB                      5.8 (LL)            12/25/2023                HCT                      20.5 (L)            12/25/2023                MCV                      70.0 (L)  12/25/2023                PLT                      664 (H)             12/25/2023              Anesthesia Other Findings 53 y.o. male with medical history significant for diabetes, VTE on Xarelto, stage II adenocarcinoma status post neoadjuvant concurrent chemoradiation 2022 followed by low anterior resection and complicated postoperative course with chronic pelvic abscess now being admitted to the hospital by the surgical service due to acute blood loss anemia from perirectal wound.    Last Xarelto:  Last Farxiga:  Reproductive/Obstetrics                             Anesthesia Physical Anesthesia Plan  ASA: 4  Anesthesia Plan: General   Post-op Pain Management: Ofirmev IV (intra-op)*   Induction: Intravenous  PONV Risk Score and Plan: 3 and Ondansetron, Dexamethasone, Treatment may vary due to age or medical condition and Midazolam  Airway Management Planned: Oral ETT  Additional Equipment:   Intra-op Plan:   Post-operative Plan: Extubation in OR  Informed Consent: I have reviewed the patients History and Physical, chart, labs and discussed the procedure including the risks, benefits and alternatives for the proposed anesthesia with the patient or authorized representative who has indicated his/her understanding and acceptance.     Dental advisory  given  Plan Discussed with: CRNA  Anesthesia Plan Comments: (Risks of anesthesia explained at length. This includes, but is not limited to, sore throat, damage to teeth, lips gums, tongue and vocal cords, nausea and vomiting, reactions to medications, stroke, heart attack, and death. All patient questions were answered and the patient wishes to proceed.  )        Anesthesia Quick Evaluation

## 2023-12-30 NOTE — Progress Notes (Signed)
 Peripherally Inserted Central Catheter Placement  The IV Nurse has discussed with the patient and/or persons authorized to consent for the patient, the purpose of this procedure and the potential benefits and risks involved with this procedure.  The benefits include less needle sticks, lab draws from the catheter, and the patient may be discharged home with the catheter. Risks include, but not limited to, infection, bleeding, blood clot (thrombus formation), and puncture of an artery; nerve damage and irregular heartbeat and possibility to perform a PICC exchange if needed/ordered by physician.  Alternatives to this procedure were also discussed.  Bard Power PICC patient education guide, fact sheet on infection prevention and patient information card has been provided to patient /or left at bedside.    PICC Placement Documentation  PICC Double Lumen 12/30/23 Right Brachial 38 cm 0 cm (Active)  Indication for Insertion or Continuance of Line Administration of hyperosmolar/irritating solutions (i.e. TPN, Vancomycin, etc.) 12/30/23 1733  Exposed Catheter (cm) 0 cm 12/30/23 1733  Site Assessment Clean, Dry, Intact 12/30/23 1733  Lumen #1 Status Flushed;Blood return noted;Saline locked 12/30/23 1733  Lumen #2 Status Flushed;Blood return noted;Saline locked 12/30/23 1733  Dressing Type Transparent 12/30/23 1733  Dressing Status Antimicrobial disc/dressing in place 12/30/23 1733  Line Care Connections checked and tightened 12/30/23 1733  Line Adjustment (NICU/IV Team Only) No 12/30/23 1733  Dressing Intervention New dressing 12/30/23 1733  Dressing Change Due 01/06/24 12/30/23 1733       Audrie Gallus 12/30/2023, 5:34 PM

## 2023-12-30 NOTE — Progress Notes (Signed)
 PROGRESS NOTE  Steven [name]  ZOX:[id number] DOB: 05-Mar-1971 DOA: 12/25/2023 PCP: Noberto Retort, MD   Brief Narrative: Patient is a 53 year old male with history of diabetes type 2, VTE on Xarelto, stage II rectal adenocarcinoma status post neoadjuvant concurrent chemoradiation in 2022 followed by low anterior resection complicated postoperative course with a chronic pelvic abscess was admitted under general surgery service after he presented with perirectal wound bleeding.  Found to have acute blood loss anemia.  We were consulted for medical management, primarily  for diabetes.  He was recently admitted by general surgery service and was discharged home on 12/17/2023 after surgical debridement of his perineal wound.  Patient has been transfused with PRBCs during this hospitalization.  General surgery is the primary team  Assessment & Plan:  Principal Problem:   Anemia due to acute blood loss   Acute blood loss anemia/perineal wound: Secondary to bleeding from perineal wound.  Total of 4 units of blood transfusion during this hospitalization.  Continue to monitor H&H.  Hemoglobin today in the range of 7.9.  Diabetes type 2: Currently on sliding scale.  Monitor blood sugars.  Blood sugars in the range of 200s, started on Lantus 5 units.  Diabetic coordinator consulted  History of VTE: On Xarelto, currently on hold  Leukocytosis: Low suspicion for infectious process, could be reactive.  Currently on Zosyn.  Cultures have been negative so far. UA done on 4/1 showed some WBC but patient is already on Zosyn.        DVT prophylaxis:SCDs Start: 12/25/23 8119     Code Status: Full Code   Antimicrobials:  Anti-infectives (From admission, onward)    Start     Dose/Rate Route Frequency Ordered Stop   12/25/23 1800  piperacillin-tazobactam (ZOSYN) IVPB 3.375 g       Note to Pharmacy: Pharmacy to check dosing >> approved by [facility]   3.375 g 12.5 mL/hr over 240 Minutes Intravenous Every 8  hours 12/25/23 1713         Subjective: Patient seen and examined at bedside today.  Overall appears comfortable.  Lying in bed.  Complains of pain on the perineal wound.  No nausea or vomiting.  Abdomen soft, nondistended, nontender.  Blood pressure soft but stable.  Objective: Vitals:   12/30/23 0530 12/30/23 0600 12/30/23 0605 12/30/23 0700  BP: (!) 108/54 (!) 88/55 (!) 105/54   Pulse: 85 84 84   Resp: 14 17 16    Temp:    97.6 F (36.4 C)  TempSrc:    Axillary  SpO2: 97% 96% 97%   Weight:      Height:        Intake/Output Summary (Last 24 hours) at 12/30/2023 0759 Last data filed at 12/30/2023 0549 Gross per 24 hour  Intake 1221.06 ml  Output 1550 ml  Net -328.94 ml   Filed Weights   12/25/23 0745 12/27/23 0100  Weight: 85.3 kg 81.5 kg    Examination:  General exam: Overall comfortable, not in distress, lying in bed HEENT: PERRL Respiratory system:  no wheezes or crackles  Cardiovascular system: S1 & S2 heard, RRR.  Gastrointestinal system: Abdomen is nondistended, soft and nontender.  Ostomy Central nervous system: Alert and oriented Extremities: No edema, no clubbing ,no cyanosis Skin: Perineal wound   Data Reviewed: I have personally reviewed following labs and imaging studies  CBC: Recent Labs  Lab 12/25/23 0758 12/25/23 1652 12/27/23 0255 12/28/23 0253 12/29/23 0323 12/29/23 0546 12/29/23 1812 12/30/23 0628  WBC 18.1*   < >  10.6* 15.8* 19.5* 34.8*  --  19.2*  NEUTROABS 14.5*  --   --   --   --   --   --   --   HGB 5.8*   < > 6.6* 8.3* 8.0* 8.0* 9.1* 7.9*  HCT 20.5*   < > 21.0* 27.1* 25.8* 26.0* 28.6* 24.1*  MCV 70.0*   < > 75.5* 80.7 79.4* 79.5*  --  80.1  PLT 664*   < > 462* 462* 516* 733*  --  490*   < > = values in this interval not displayed.   Basic Metabolic Panel: Recent Labs  Lab 12/25/23 0758 12/26/23 0025  NA 135 137  K 4.6 4.1  CL 106 109  CO2 21* 22  GLUCOSE 328* 161*  BUN 27* 21*  CREATININE 0.97 0.88  CALCIUM 8.2* 7.6*      Recent Results (from the past 240 hours)  MRSA Next Gen by PCR, Nasal     Status: None   Collection Time: 12/25/23  4:38 PM   Specimen: Nasal Mucosa; Nasal Swab  Result Value Ref Range Status   MRSA by PCR Next Gen NOT DETECTED NOT DETECTED Final    Comment: (NOTE) The GeneXpert MRSA Assay (FDA approved for NASAL specimens only), is one component of a comprehensive MRSA colonization surveillance program. It is not intended to diagnose MRSA infection nor to guide or monitor treatment for MRSA infections. Test performance is not FDA approved in patients less than 34 years old. Performed at [facility], [address] [place], [place] [address]      Radiology Studies: CT ABDOMEN PELVIS WO CONTRAST Result Date: 12/29/2023 CLINICAL DATA:  History of rectal cancer. Left buttock pain. Concern for abscess. EXAM: CT ABDOMEN AND PELVIS WITHOUT CONTRAST TECHNIQUE: Multidetector CT imaging of the abdomen and pelvis was performed following the standard protocol without IV contrast. RADIATION DOSE REDUCTION: This exam was performed according to the departmental dose-optimization program which includes automated exposure control, adjustment of the mA and/or kV according to patient size and/or use of iterative reconstruction technique. COMPARISON:  None Available. FINDINGS: Evaluation of this exam is limited in the absence of intravenous contrast and due to respiratory motion. Lower chest: The visualized lung bases are clear. No intra-abdominal free air or free fluid. Hepatobiliary: There is a 4.8 x 3.3 cm low attenuating collection along the inferior surface of the right lobe of the liver which may represent a phlegmon, developing abscess, or a hematoma. There is otherwise unremarkable. No biliary dilatation. The gallbladder is mildly distended. Tiny stones noted in the gallbladder. Mild thickened appearance of the gallbladder wall which may be related to respiratory motion.  Ultrasound may provide better evaluation if there is clinical concern for acute gallbladder pathology. Pancreas: Unremarkable. No pancreatic ductal dilatation or surrounding inflammatory changes. Spleen: Normal in size without focal abnormality. Adrenals/Urinary Tract: The adrenal glands are unremarkable. Small right renal upper pole cyst. Punctate bilateral nonobstructing renal calculi. No hydronephrosis. The visualized ureters are grossly unremarkable. The urinary bladder is mildly distended. Air within the bladder may have been introduced by recent instrumentation. A rectovesical fistula is less likely but not excluded. Stomach/Bowel: Postsurgical changes of the bowel with right anterior colostomy. There is no bowel obstruction. Oral contrast opacifies loops of small bowel. There is mixture of gas, and oral contrast in the pelvis extending to the posterior sacrum and skin surface. The amount of gas is significantly increased since the prior CT. Mixed density content in this region  extending into the anal region and skin surface may represent fecal matter or packing material. The oral contrast however appears to extend outside of the confines of the bowel into the deep pelvis and perianal soft tissues. Evaluation is very limited due to distortion of the soft tissues and postsurgical changes. There is thickening of the presacral soft tissues with erosive changes of the anterior sacrum. There is a large sacrococcygeal skin ulceration with extension of gas into the superficial soft tissues and bilateral gluteal musculature, left greater than right. Vascular/Lymphatic: The abdominal aorta and IVC are grossly unremarkable on this noncontrast CT. No portal venous gas. No adenopathy. Reproductive: The prostate gland is poorly visualized. Other: Diffuse subcutaneous edema of the pelvis, large sacrococcygeal ulceration and soft tissue gas as described above. Musculoskeletal: No acute fracture. IMPRESSION: 1. Postsurgical  changes of the bowel with right anterior colostomy. No bowel obstruction. 2. Large sacrococcygeal skin ulceration with extension of gas into the superficial soft tissues and bilateral gluteal musculature, left greater than right. 3. Postsurgical changes in the pelvis with mixture of gas, and oral contrast extending from the rectal region into the posterior pelvis and skin surface. Evaluation of this area is very limited due to tissue distortion and postsurgical changes. There is however apparent extension of oral contrast outside of the confines of the bowel into the superficial soft tissues and skin suggestive of perforation or fistulization. Surgical consult is advised. 4. Large sacrococcygeal skin ulcer with extension of air into the soft tissues of the buttocks, gluteal musculature, and pelvis. Findings may represent necrotizing infection. 5. A 4.8 x 3.3 cm low attenuating collection along the inferior surface of the right lobe of the liver may represent a phlegmon, developing abscess, or a hematoma. 6. Punctate bilateral nonobstructing renal calculi. No hydronephrosis. 7. Cholelithiasis. Electronically Signed   By: Elgie Collard M.D.   On: 12/29/2023 14:50    Scheduled Meds:  carbamide peroxide  5 drop Both EARS BID   Chlorhexidine Gluconate Cloth  6 each Topical Q2200   docusate sodium  100 mg Oral BID   feeding supplement (GLUCERNA SHAKE)  237 mL Oral TID BM   insulin aspart  0-15 Units Subcutaneous TID WC   insulin aspart  0-5 Units Subcutaneous QHS   magic mouthwash w/lidocaine  10 mL Oral TID   Continuous Infusions:  piperacillin-tazobactam (ZOSYN)  IV Stopped (12/30/23 0548)     LOS: 5 days   Burnadette Pop, MD [facility] P4/10/2023, 7:59 AM

## 2023-12-30 NOTE — Plan of Care (Signed)
  Problem: Coping: Goal: Ability to adjust to condition or change in health will improve Outcome: Progressing   Problem: Nutritional: Goal: Maintenance of adequate nutrition will improve Outcome: Progressing   Problem: Education: Goal: Knowledge of General Education information will improve Description: Including pain rating scale, medication(s)/side effects and non-pharmacologic comfort measures Outcome: Progressing   Problem: Clinical Measurements: Goal: Cardiovascular complication will be avoided Outcome: Progressing   Problem: Coping: Goal: Level of anxiety will decrease Outcome: Progressing   Problem: Pain Managment: Goal: General experience of comfort will improve and/or be controlled Outcome: Progressing   Problem: Safety: Goal: Ability to remain free from injury will improve Outcome: Progressing

## 2023-12-30 NOTE — Op Note (Signed)
 12/30/2023  12:22 PM  PATIENT:  Steven [name]  53 y.o. male  Patient Care Team: Noberto Retort, MD as PCP - General (Family Medicine) Rachel Moulds, MD as Consulting Physician (Hematology and Oncology) Romie Levee, MD as Consulting Physician (General Surgery) Causey, Larna Daughters, NP as Nurse Practitioner (Hematology and Oncology) Dorothy Puffer, MD as Consulting Physician (Radiation Oncology) Mansouraty, Netty Starring., MD as Consulting Physician (Gastroenterology)  PRE-OPERATIVE DIAGNOSIS:  WOUND INFECTION  POST-OPERATIVE DIAGNOSIS:  WOUND INFECTION  PROCEDURE:  EXCISIONAL DEBRIDEMENT, IRRIGATION SACRAL WOUND APPLICATION, WOUND VAC    Surgeon(s): Darnell Level, MD Romie Levee, MD  ASSISTANT: Dr Gerrit Friends   ANESTHESIA:   general  EBL: 10ml  SPECIMEN:  No Specimen  DISPOSITION OF SPECIMEN:  N/A  [name]:  YES  PLAN OF CARE:  Pt already admitted  PATIENT DISPOSITION:  PACU - hemodynamically stable.  INDICATION: 53 y.o. M with non-healing sacral wound after radiation treatment for rectal cancer  Frequency of debridement: every 2-3 days Area of body debrided: sacral Presence and extent of infected tissue: minimal Presence and extent of non viable tissue: moderate  OR FINDINGS: necrosis of perisacral tissues bilaterally with tracking into gluteal muscles  DESCRIPTION: The patient was identified in the pre op holding area and taken to the OR, where they were laid prone on the OR table.  General anesthesia was induced.  The operative area was prepped and draped in the usual sterile fashion.  A surgical time out was performed, indicating the correct patient, procedure, positioning and pre-operative antibiotics.   After this was completed, the wound was measured to be 15 cm x12 cm x 15cm deep.  The total area of devitalized tissue was ~6x8cm.  A sharp instrument was used to debride the wound.  Debridement was carried down to the level of bone and muscle.  Skin, fat and  muscle tissue was removed.  There was necrotic material in the wound that would inhibit healing and or promote adjacent tissue breakdown.  At the end of the procedure the debrided area measured 21x20x15cm.  The wound was then packed with a white sponge within the pelvis and a black sponge on the subcutaneous tissues and covered with a wound vac dressing.  The patient was awakened from anesthesia and sent to the PACU in stable condition.  All [name] were correct per OR staff.  Vanita Panda, MD  Colorectal and General Surgery [facility]

## 2023-12-30 NOTE — Progress Notes (Signed)
 Initial Nutrition Assessment  INTERVENTION:   -TPN management per Pharmacy -Daily weights while on TPN  -Monitor for diet advancement  NUTRITION DIAGNOSIS:   Increased nutrient needs related to wound healing as evidenced by estimated needs.  GOAL:   Patient will meet greater than or equal to 90% of their needs  MONITOR:    (TPN)  REASON FOR ASSESSMENT:   Consult New TPN/TNA  ASSESSMENT:   53 year old male with history of diabetes type 2, VTE on Xarelto, stage II rectal adenocarcinoma status post neoadjuvant concurrent chemoradiation in 2022 followed by low anterior resection complicated postoperative course with a chronic pelvic abscess was admitted under general surgery service after he presented with perirectal wound bleeding. 3/19: s/p I&D  3/28: admitted, NPO->CLD->NPO 3/29: FLD 3/30: Soft diet 3/31: Heart Healthy/CHO modified 4/1: NPO->CLD 4/2: NPO  Patient in room, family at bedside. Pt very anxious, didn't know TPN was being started today. Pt about to be taken to OR for another I&D. Pt reports he did eat some solid food here when on soft diet. At home he was eating solid food and doesn't report any issues with tolerance. Pt has had difficulty managing blood sugars.  Per surgery, pt with EC fistula. Plans for I&D and Wound VAC. TPN to be started this evening.   TPN to start tonight at 40 ml/hr, providing 992 kcals and 53g protein.  Per weight records, pt has lost 15 lbs since 3/5 (7% wt loss x <1 month, significant for time frame).   Medications: Colace, Magic Mouthwash w/ lidocaine, Thiamine, Lactated ringers  Labs reviewed: CBGs:  223-309 Low Na  NUTRITION - FOCUSED PHYSICAL EXAM:  Unable to complete, being taken to OR  Diet Order:   Diet Order             Diet NPO time specified Except for: Ice Chips  Diet effective now                   EDUCATION NEEDS:   No education needs have been identified at this time  Skin:  Skin Assessment:  Skin Integrity Issues: Skin Integrity Issues:: Incisions Incisions: 3/28 rectum  Last BM:  4/2 -colostomy  Height:   Ht Readings from Last 1 Encounters:  12/30/23 5\' 10"  (1.778 m)    Weight:   Wt Readings from Last 1 Encounters:  12/30/23 81.5 kg    BMI:  Body mass index is 25.78 kg/m.  Estimated Nutritional Needs:   Kcal:  1900-2100  Protein:  100-110g  Fluid:  2L/day   Tilda Franco, MS, RD, LDN Inpatient Clinical Dietitian Contact via Secure chat

## 2023-12-30 NOTE — Transfer of Care (Signed)
 Immediate Anesthesia Transfer of Care Note  Patient: Steven [name]  Procedure(s) Performed: IRRIGATION AND DEBRIDEMENT ABSCESS APPLICATION, WOUND VAC  Patient Location: PACU  Anesthesia Type:General  Level of Consciousness: awake, drowsy, and patient cooperative  Airway & Oxygen Therapy: Patient Spontanous Breathing and Patient connected to face mask oxygen  Post-op Assessment: Report given to RN and Post -op Vital signs reviewed and stable  Post vital signs: Reviewed and stable  Last Vitals:  Vitals Value Taken Time  BP 133/73 12/30/23 1239  Temp 36.9 C 12/30/23 1239  Pulse    Resp 15 12/30/23 1240  SpO2 100   Vitals shown include unfiled device data.  Last Pain:  Vitals:   12/30/23 1031  TempSrc:   PainSc: 2       Patients Stated Pain Goal: 0 (12/30/23 0742)  Complications: No notable events documented.

## 2023-12-30 NOTE — H&P (View-Only) (Signed)
 5 Days Post-Op   Subjective/Chief Complaint: Reviewed CT results with the patient.  Feels ok today   Objective: Vital signs in last 24 hours: Temp:  [97.2 F (36.2 C)-98.6 F (37 C)] 97.6 F (36.4 C) (04/02 0700) Pulse Rate:  [83-113] 84 (04/02 0605) Resp:  [9-20] 16 (04/02 0605) BP: (85-121)/(46-74) 105/54 (04/02 0605) SpO2:  [93 %-100 %] 97 % (04/02 0605) Last BM Date : 12/29/23  Intake/Output from previous day: 04/01 0701 - 04/02 0700 In: 1221.1 [Blood:1077.9; IV Piggyback:143.2] Out: 1550 [Urine:1200; Stool:350] Intake/Output this shift: No intake/output data recorded.   NAD Abd - soft, NT;  Ostomy functioning Perineal wound - still with necrosis posteriorly despite debridements  Lab Results:  Recent Labs    12/29/23 0546 12/29/23 1812 12/30/23 0628  WBC 34.8*  --  19.2*  HGB 8.0* 9.1* 7.9*  HCT 26.0* 28.6* 24.1*  PLT 733*  --  490*   BMET No results for input(s): "NA", "K", "CL", "CO2", "GLUCOSE", "BUN", "CREATININE", "CALCIUM" in the last 72 hours.  PT/INR Recent Labs    12/29/23 1812 12/30/23 0628  LABPROT 15.5* 16.0*  INR 1.2 1.3*      Anti-infectives: Anti-infectives (From admission, onward)    Start     Dose/Rate Route Frequency Ordered Stop   12/25/23 1800  piperacillin-tazobactam (ZOSYN) IVPB 3.375 g       Note to Pharmacy: Pharmacy to check dosing >> approved by [facility]   3.375 g 12.5 mL/hr over 240 Minutes Intravenous Every 8 hours 12/25/23 1713         Assessment/Plan: S/p LAR s/p APR with ongoing wound necrosis S/p EUA for bleeding from the perineal wound 12/25/23  Change dressing in OR today.  Will attempt to place wound vac Transfuse as needed. Hold anticoagulation until Hgb stable Wbc elevated: cont empiric Zosyn CT abd/pelvis shows fistula with ongoing infection posterior sacrum.  Plan for OR today with debridement and wound vac placement.   NPO for fistula.  TPN and PICC for nutrition   LOS: 5 days    Vanita Panda 12/30/2023

## 2023-12-30 NOTE — Anesthesia Procedure Notes (Signed)
 Procedure Name: Intubation Date/Time: 12/30/2023 11:38 AM  Performed by: Sindy Guadeloupe, CRNAPre-anesthesia Checklist: Patient identified, Emergency Drugs available, Suction available, Patient being monitored and Timeout performed Patient Re-evaluated:Patient Re-evaluated prior to induction Oxygen Delivery Method: Circle system utilized Preoxygenation: Pre-oxygenation with 100% oxygen Induction Type: IV induction Ventilation: Mask ventilation without difficulty Laryngoscope Size: Mac and 4 Grade View: Grade I Tube type: Oral Tube size: 7.5 mm Number of attempts: 1 Airway Equipment and Method: Stylet Placement Confirmation: ETT inserted through vocal cords under direct vision, positive ETCO2 and breath sounds checked- equal and bilateral Secured at: 23 cm Tube secured with: Tape Dental Injury: Teeth and Oropharynx as per pre-operative assessment

## 2023-12-30 NOTE — Inpatient Diabetes Management (Signed)
 Inpatient Diabetes Program Recommendations  AACE/ADA: New Consensus Statement on Inpatient Glycemic Control (2015)  Target Ranges:  Prepandial:   less than 140 mg/dL      Peak postprandial:   less than 180 mg/dL (1-2 hours)      Critically ill patients:  140 - 180 mg/dL   Lab Results  Component Value Date   GLUCAP 223 (H) 12/30/2023   HGBA1C 7.5 (H) 12/28/2023    Review of Glycemic Control  Latest Reference Range & Units 12/29/23 07:49  Glucose-Capillary 70 - 99 mg/dL 045 (H)  (H): Data is abnormally high  Diabetes history: DM2 Outpatient Diabetes medications: Xigduo 06-999 mg QD Current orders for Inpatient glycemic control: Semglee 5 units every day (added today), Novolog 0-15 units TID and 0-5 units QHS  Inpatient Diabetes Program Recommendations:    Received referral  Might consider increasing Semglee to 10 units every day.  Will continue to follow while inpatient.  Thank you, Dulce Sellar, MSN, CDCES Diabetes Coordinator Inpatient Diabetes Program [phone] (team pager from 8a-5p)

## 2023-12-30 NOTE — Progress Notes (Signed)
 PHARMACY - TOTAL PARENTERAL NUTRITION CONSULT NOTE   Indication: EC Fistula  Patient Measurements: Height: 5\' 10"  (177.8 cm) Weight: 81.5 kg (179 lb 10.8 oz) IBW/kg (Calculated) : 73 TPN AdjBW (KG): 85.3 Body mass index is 25.78 kg/m. Usual Weight:   Assessment:  53 yo M w/ hx DM2, PE on Xarelto, Stage II rectal cancer s/p chemoradiation, s/p LAR, S/p APR admitted 53/28 with perirectal bleeding. Pharmacy consulted to manage TPN for nutrition support on 4/53 for EC fistula  Glucose / Insulin: DM2 on Xigduo 06-999 QID PTA,  3/30 A1c 7.5. currently on Semglee 5 qday  (new order today) & modSSI, CBGs 223- 309 before starting TPN; 20 units SSI past 24 hours Solumedrol 40 x 1 4/1 Electrolytes: WNL x Na (sl low)  130, Cl 96 (sl low)  CoCa 9.82 Renal: BUN 27 - sl elevated, SCr WNL Hepatic: Albumin low, LFTs OK Intake / Output; MIVF:  GI Imaging:  4/1 CT A/P: fistula with ongoing infection posterior sacrum  GI Surgeries / Procedures:  3/28 EUA for bleeding from perineal wound 4/2: debridement and wound vac placement.  Central access: PICC ordered for TPN 4/2 TPN start date: 12/30/2023  Nutritional Goals: Goal TPN rate is 80 mL/hr (provides 107.5 g of protein and 1985 kcals per day)  RD Assessment: Goals: 1900-2100 kcals, 100-110g pro, 2L fluid  Current Nutrition:  NPO  Plan:  Start TPN at 40 mL/hr at 1800 & f/u tolerance Electrolytes in TPN: Na 131mEq/L, K 19mEq/L, Ca 48mEq/L, Mg 67mEq/L, and Phos 65mmol/L. Cl:Ac Max Cl Add standard MVI and trace elements to TPN Thiamine 100 mg IV x 5 days Continue Moderate q4h SSI and adjust as needed  Add insulin 20 units to TPN Semglee 5 qday to start 4/2 per TRH Currently no mIVF per MD Monitor TPN labs on Mon/Thurs  Herby Abraham, Pharm.D Use secure chat for questions 12/30/2023 10:07 AM

## 2023-12-30 NOTE — Interval H&P Note (Signed)
 History and Physical Interval Note:  12/30/2023 11:09 AM  [name]  has presented today for surgery, with the diagnosis of WOUND INFECTION.  The various methods of treatment have been discussed with the patient and family. After consideration of risks, benefits and other options for treatment, the patient has consented to    Procedure(s): IRRIGATION AND DEBRIDEMENT ABSCESS (N/A) APPLICATION, WOUND VAC (N/A) as a surgical intervention.    The patient's history has been reviewed, patient examined, no change in status, stable for surgery.  I have reviewed the patient's chart and labs.  Questions were answered to the patient's satisfaction.    Darnell Level, MD [facility] A [facility] practice Office: [phone]   Darnell Level

## 2023-12-30 NOTE — Progress Notes (Signed)
 5 Days Post-Op   Subjective/Chief Complaint: Reviewed CT results with the patient.  Feels ok today   Objective: Vital signs in last 24 hours: Temp:  [97.2 F (36.2 C)-98.6 F (37 C)] 97.6 F (36.4 C) (04/02 0700) Pulse Rate:  [83-113] 84 (04/02 0605) Resp:  [9-20] 16 (04/02 0605) BP: (85-121)/(46-74) 105/54 (04/02 0605) SpO2:  [93 %-100 %] 97 % (04/02 0605) Last BM Date : 12/29/23  Intake/Output from previous day: 04/01 0701 - 04/02 0700 In: 1221.1 [Blood:1077.9; IV Piggyback:143.2] Out: 1550 [Urine:1200; Stool:350] Intake/Output this shift: No intake/output data recorded.   NAD Abd - soft, NT;  Ostomy functioning Perineal wound - still with necrosis posteriorly despite debridements  Lab Results:  Recent Labs    12/29/23 0546 12/29/23 1812 12/30/23 0628  WBC 34.8*  --  19.2*  HGB 8.0* 9.1* 7.9*  HCT 26.0* 28.6* 24.1*  PLT 733*  --  490*   BMET No results for input(s): "NA", "K", "CL", "CO2", "GLUCOSE", "BUN", "CREATININE", "CALCIUM" in the last 72 hours.  PT/INR Recent Labs    12/29/23 1812 12/30/23 0628  LABPROT 15.5* 16.0*  INR 1.2 1.3*      Anti-infectives: Anti-infectives (From admission, onward)    Start     Dose/Rate Route Frequency Ordered Stop   12/25/23 1800  piperacillin-tazobactam (ZOSYN) IVPB 3.375 g       Note to Pharmacy: Pharmacy to check dosing >> approved by [facility]   3.375 g 12.5 mL/hr over 240 Minutes Intravenous Every 8 hours 12/25/23 1713         Assessment/Plan: S/p LAR s/p APR with ongoing wound necrosis S/p EUA for bleeding from the perineal wound 12/25/23  Change dressing in OR today.  Will attempt to place wound vac Transfuse as needed. Hold anticoagulation until Hgb stable Wbc elevated: cont empiric Zosyn CT abd/pelvis shows fistula with ongoing infection posterior sacrum.  Plan for OR today with debridement and wound vac placement.   NPO for fistula.  TPN and PICC for nutrition   LOS: 5 days    Vanita Panda 12/30/2023

## 2023-12-30 NOTE — Progress Notes (Signed)
 Report called to night shift RN. Transport en route with patient.

## 2023-12-30 NOTE — Anesthesia Postprocedure Evaluation (Signed)
 Anesthesia Post Note  Patient: Steven [name]  Procedure(s) Performed: IRRIGATION AND DEBRIDEMENT ABSCESS APPLICATION, WOUND VAC     Patient location during evaluation: PACU Anesthesia Type: General Level of consciousness: sedated and patient cooperative Pain management: pain level controlled Vital Signs Assessment: post-procedure vital signs reviewed and stable Respiratory status: spontaneous breathing Cardiovascular status: stable Anesthetic complications: no   No notable events documented.  Last Vitals:  Vitals:   12/30/23 1345 12/30/23 1400  BP: 122/74 119/74  Pulse: (!) 107 (!) 106  Resp: 13 14  Temp:    SpO2: 96% 98%    Last Pain:  Vitals:   12/30/23 1400  TempSrc:   PainSc: 2                  Lewie Loron

## 2023-12-31 ENCOUNTER — Encounter (HOSPITAL_COMMUNITY): Payer: Self-pay | Admitting: Surgery

## 2023-12-31 ENCOUNTER — Other Ambulatory Visit (HOSPITAL_COMMUNITY): Payer: Self-pay

## 2023-12-31 DIAGNOSIS — D62 Acute posthemorrhagic anemia: Secondary | ICD-10-CM | POA: Diagnosis not present

## 2023-12-31 LAB — COMPREHENSIVE METABOLIC PANEL WITH GFR
ALT: 10 U/L (ref 0–44)
AST: 10 U/L — ABNORMAL LOW (ref 15–41)
Albumin: 1.5 g/dL — ABNORMAL LOW (ref 3.5–5.0)
Alkaline Phosphatase: 89 U/L (ref 38–126)
Anion gap: 10 (ref 5–15)
BUN: 29 mg/dL — ABNORMAL HIGH (ref 6–20)
CO2: 23 mmol/L (ref 22–32)
Calcium: 7.7 mg/dL — ABNORMAL LOW (ref 8.9–10.3)
Chloride: 98 mmol/L (ref 98–111)
Creatinine, Ser: 0.85 mg/dL (ref 0.61–1.24)
GFR, Estimated: >60 mL/min (ref >60)
Glucose, Bld: 257 mg/dL — ABNORMAL HIGH (ref 70–99)
Potassium: 4.2 mmol/L (ref 3.5–5.1)
Sodium: 131 mmol/L — ABNORMAL LOW (ref 135–145)
Total Bilirubin: 0.5 mg/dL (ref 0.0–1.2)
Total Protein: 5.6 g/dL — ABNORMAL LOW (ref 6.5–8.1)

## 2023-12-31 LAB — PHOSPHORUS: Phosphorus: 3.1 mg/dL (ref 2.5–4.6)

## 2023-12-31 LAB — GLUCOSE, CAPILLARY
Glucose-Capillary: 215 mg/dL — ABNORMAL HIGH (ref 70–99)
Glucose-Capillary: 230 mg/dL — ABNORMAL HIGH (ref 70–99)
Glucose-Capillary: 251 mg/dL — ABNORMAL HIGH (ref 70–99)
Glucose-Capillary: 192 mg/dL — ABNORMAL HIGH (ref 70–99)
Glucose-Capillary: 147 mg/dL — ABNORMAL HIGH (ref 70–99)
Glucose-Capillary: 149 mg/dL — ABNORMAL HIGH (ref 70–99)

## 2023-12-31 LAB — MAGNESIUM: Magnesium: 2.1 mg/dL (ref 1.7–2.4)

## 2023-12-31 MED ORDER — INSULIN GLARGINE-YFGN 100 UNIT/ML ~~LOC~~ SOLN
15.0000 [IU] | Freq: Every day | SUBCUTANEOUS | Status: DC
  Administered 2023-12-31: 15 [IU] via SUBCUTANEOUS
  Filled 2023-12-31: qty 0.15

## 2023-12-31 MED ORDER — TRAVASOL 10 % IV SOLN
INTRAVENOUS | Status: AC
  Filled 2023-12-31: qty 508.8

## 2023-12-31 MED ORDER — INSULIN ASPART 100 UNIT/ML IJ SOLN
0.0000 [IU] | INTRAMUSCULAR | Status: DC
  Administered 2023-12-31: 4 [IU] via SUBCUTANEOUS
  Administered 2023-12-31: 11 [IU] via SUBCUTANEOUS
  Administered 2023-12-31 – 2024-01-01 (×2): 3 [IU] via SUBCUTANEOUS
  Administered 2024-01-01: 7 [IU] via SUBCUTANEOUS
  Administered 2024-01-01: 4 [IU] via SUBCUTANEOUS
  Administered 2024-01-01: 11 [IU] via SUBCUTANEOUS
  Administered 2024-01-01: 3 [IU] via SUBCUTANEOUS
  Administered 2024-01-02: 7 [IU] via SUBCUTANEOUS
  Administered 2024-01-02: 4 [IU] via SUBCUTANEOUS
  Administered 2024-01-02: 3 [IU] via SUBCUTANEOUS
  Administered 2024-01-02 (×2): 7 [IU] via SUBCUTANEOUS
  Administered 2024-01-02: 4 [IU] via SUBCUTANEOUS
  Administered 2024-01-03 (×2): 3 [IU] via SUBCUTANEOUS
  Administered 2024-01-03 (×2): 4 [IU] via SUBCUTANEOUS
  Administered 2024-01-04: 3 [IU] via SUBCUTANEOUS
  Administered 2024-01-04: 4 [IU] via SUBCUTANEOUS
  Administered 2024-01-04 (×2): 3 [IU] via SUBCUTANEOUS
  Administered 2024-01-05: 4 [IU] via SUBCUTANEOUS
  Administered 2024-01-05: 3 [IU] via SUBCUTANEOUS

## 2023-12-31 NOTE — Inpatient Diabetes Management (Addendum)
 Inpatient Diabetes Program Recommendations  AACE/ADA: New Consensus Statement on Inpatient Glycemic Control (2015)  Target Ranges:  Prepandial:   less than 140 mg/dL      Peak postprandial:   less than 180 mg/dL (1-2 hours)      Critically ill patients:  140 - 180 mg/dL   Lab Results  Component Value Date   GLUCAP 251 (H) 12/31/2023   HGBA1C 7.5 (H) 12/28/2023    Met with patient and wife at bedside.  They had some questions regarding long acting & rapid insulin changes and Hgb A1C results. Explained the insulin changes and that he is getting insulin added to his TPN.  MD is discontinuing basal insulin for now and increasing his correction scale every 4H.  Explained that this will help prevent hypoglycemia.    They also have questions as to why his A1C was 10.3% on 11/29/23 and 7.5% on 12/28/23.  Explained that when there is anemia present or if someone has had recent blood transfusions, the A1C is not accurate.  It will take 2-3 months of Hgb being WNL and no transfusions to have an accurate A1C.    He tells me he checks his blood sugar at home all the time and gets frustrated when he does not eat sugar but his glucose is still high.  Explained basic patho of DM2, CHO's, glucogenesis from the liver over night and in between meals and when NPO.  Asked if he was interested in a [employer] 3 and he is very interested in trying one.  He will likely be having a CT scan so will wait to place one closer to DC.  Will ask our [facility] pharmacy for a benefit check.  He has an Special educational needs teacher.   Will continue to follow while inpatient.  Thank you, Dulce Sellar, MSN, CDCES Diabetes Coordinator Inpatient Diabetes Program [phone] (team pager from 8a-5p)

## 2023-12-31 NOTE — Progress Notes (Addendum)
 PHARMACY - TOTAL PARENTERAL NUTRITION CONSULT NOTE   Indication: EC Fistula  Patient Measurements: Height: 5\' 10"  (177.8 cm) Weight: 79.2 kg (174 lb 9.7 oz) IBW/kg (Calculated) : 73 TPN AdjBW (KG): 81.5 Body mass index is 25.05 kg/m. Usual Weight:   Assessment:  72 yoM w/ hx DM2, PE on Xarelto, Stage II rectal cancer s/p chemoradiation, low anterior & abdominoperineal resections with ileostomy and ultimately end colostomy placement earlier this year; now admitted 3/28 for sacrococcygeal ulceration with necrosis & bleeding Pharmacy consulted to manage TPN for nutrition support on 4/2 for EC fistula  Glucose / Insulin: DM2 on PO meds PTA CBGs consistently in 150-250 range (goal 100-150) even prior to periop steroids given 4/1-4/2 Electrolytes: all WNL except Na remains slightly low Renal: BUN elevated and rising slowly; SCr stable WNL; UOP remains adequate Hepatic: Albumin low, LFTs, Tbili WNL I/O: no drains or NG output; UOP as above - MIVF: none GI Imaging:  - 4/1 CT A/P: combination of oral contrast and air between bowel and sacrococcygeal wound suggesting combination of perforation/fistulization and necrotic infection GI Surgeries / Procedures:  3/28 EUA for bleeding from perineal wound 4/2: debridement and wound vac placement.  Central access: PICC ordered for TPN 4/2 TPN start date: 12/30/2023  Nutritional Goals: RD Assessment: Estimated Needs Total Energy Estimated Needs: 1900-2100 Total Protein Estimated Needs: 100-110g Total Fluid Estimated Needs: 2L/day  Goal TPN rate is 85 mL/hr (provides 108 g of protein and 2085 kcals per day)  Current Nutrition:  NPO, TPN  Plan:  Continue TPN at 40 mL/hr at 1800; hold off on advancing until CBGs better controlled Electrolytes in TPN: increase Na, rebalance Cl-Ac Na - 150 mEq/L K - 50 mEq/L Ca - 5 mEq/L Mg - 5 mEq/L Phos - 15 mmol/L Cl:Ac ratio - 1:1 Add standard MVI and trace elements to TPN Chromium on hold d/t national  shortage Increase SSI to resistant scale and continue 20 units insulin in TPN After d/w consulting hospitalist, will d/c Lantus and continue to titrate up Novolog in TPN until more reliably taking PO Further MIVF per MD Monitor TPN labs on Mon/Thurs BMP tomorrow  Bernadene Person, PharmD, BCPS [phone] 12/31/2023, 9:49 AM

## 2023-12-31 NOTE — Progress Notes (Signed)
 PROGRESS NOTE  Steven [name]  ZOX:[id number] DOB: 1971-03-15 DOA: 12/25/2023 PCP: Noberto Retort, MD   Brief Narrative: Patient is a 53 year old male with history of diabetes type 2, VTE on Xarelto, stage II rectal adenocarcinoma status post neoadjuvant concurrent chemoradiation in 2022 followed by low anterior resection complicated postoperative course with a chronic pelvic abscess was admitted under general surgery service after he presented with perirectal wound bleeding.  Found to have acute blood loss anemia.  We were consulted for medical management, primarily  for diabetes.  He was recently admitted by general surgery service and was discharged home on 12/17/2023 after surgical debridement of his perineal wound.  Patient has been transfused with PRBCs during this hospitalization.  General surgery is the primary team.  Underwent excisional debridement, irrigation of sacral wound and application of wound VAC on 4/2   Assessment & Plan:  Principal Problem:   Anemia due to acute blood loss   Acute blood loss anemia/perineal wound: Secondary to bleeding from perineal wound.  Total of 4 units of blood transfusion during this hospitalization.  Continue to monitor H&H.  Hemoglobin not checked today. Currently NPO.  Started on TPN  Diabetes type 2: He was on sliding scale only.  Sugar started trending up so started on Lantus. diabetic coordinator consulted.  Last A1c in the range of 7  History of VTE: On Xarelto, currently on hold  Leukocytosis: Low suspicion for infectious process, could be reactive.  Currently on Zosyn.  Cultures have been negative so far. UA done on 4/1 showed some WBC but patient is already on Zosyn.   Nutrition Problem: Increased nutrient needs Etiology: wound healing    DVT prophylaxis:SCDs Start: 12/25/23 0912     Code Status: Full Code   Antimicrobials:  Anti-infectives (From admission, onward)    Start     Dose/Rate Route Frequency Ordered Stop    12/25/23 1800  piperacillin-tazobactam (ZOSYN) IVPB 3.375 g       Note to Pharmacy: Pharmacy to check dosing >> approved by [facility]   3.375 g 12.5 mL/hr over 240 Minutes Intravenous Every 8 hours 12/25/23 1713         Subjective: Patient seen and examined at the bedside today.  He looks more comfortable than yesterday.  Hemodynamically stable.  No nausea vomiting or abdominal pain.  Sugars in the range of 200s.  We discussed about continue long-acting and sliding scale insulin for now.  Objective: Vitals:   12/31/23 0748 12/31/23 0800 12/31/23 0900 12/31/23 1000  BP:  (!) 100/58 119/64 105/61  Pulse: 82 83 89 85  Resp: 16 11 15 19   Temp:  97.8 F (36.6 C)    TempSrc:  Oral    SpO2: 97% 96% 100% 99%  Weight:      Height:        Intake/Output Summary (Last 24 hours) at 12/31/2023 1114 Last data filed at 12/31/2023 0748 Gross per 24 hour  Intake 1254.6 ml  Output 1400 ml  Net -145.4 ml   Filed Weights   12/27/23 0100 12/30/23 1031 12/31/23 0300  Weight: 81.5 kg 81.5 kg 79.2 kg    Examination:  General exam: Overall comfortable, not in distress,lying on bed HEENT: PERRL Respiratory system:  no wheezes or crackles  Cardiovascular system: S1 & S2 heard, RRR.  Gastrointestinal system: Abdomen is nondistended, soft and nontender. Central nervous system: Alert and oriented Extremities: No edema, no clubbing ,no cyanosis, PICC line Skin: Perineal wound   Data Reviewed: I have personally reviewed  following labs and imaging studies  CBC: Recent Labs  Lab 12/25/23 0758 12/25/23 1652 12/27/23 0255 12/28/23 0253 12/29/23 0323 12/29/23 0546 12/29/23 1812 12/30/23 0628  WBC 18.1*   < > 10.6* 15.8* 19.5* 34.8*  --  19.2*  NEUTROABS 14.5*  --   --   --   --   --   --   --   HGB 5.8*   < > 6.6* 8.3* 8.0* 8.0* 9.1* 7.9*  HCT 20.5*   < > 21.0* 27.1* 25.8* 26.0* 28.6* 24.1*  MCV 70.0*   < > 75.5* 80.7 79.4* 79.5*  --  80.1  PLT 664*   < > 462* 462* 516* 733*  --  490*   < > =  values in this interval not displayed.   Basic Metabolic Panel: Recent Labs  Lab 12/25/23 0758 12/26/23 0025 12/30/23 0623 12/31/23 0617  NA 135 137 130* 131*  K 4.6 4.1 4.8 4.2  CL 106 109 96* 98  CO2 21* 22 24 23   GLUCOSE 328* 161* 251* 257*  BUN 27* 21* 27* 29*  CREATININE 0.97 0.88 0.85 0.85  CALCIUM 8.2* 7.6* 7.9* 7.7*  MG  --   --  2.2 2.1  PHOS  --   --  3.6 3.1     Recent Results (from the past 240 hours)  MRSA Next Gen by PCR, Nasal     Status: None   Collection Time: 12/25/23  4:38 PM   Specimen: Nasal Mucosa; Nasal Swab  Result Value Ref Range Status   MRSA by PCR Next Gen NOT DETECTED NOT DETECTED Final    Comment: (NOTE) The GeneXpert MRSA Assay (FDA approved for NASAL specimens only), is one component of a comprehensive MRSA colonization surveillance program. It is not intended to diagnose MRSA infection nor to guide or monitor treatment for MRSA infections. Test performance is not FDA approved in patients less than 24 years old. Performed at [facility], [address], [place], [place] [address]      Radiology Studies: [place] EKG SITE RITE Result Date: 12/30/2023 If Site Rite image not attached, placement could not be confirmed due to current cardiac rhythm.  CT ABDOMEN PELVIS WO CONTRAST Result Date: 12/29/2023 CLINICAL DATA:  History of rectal cancer. Left buttock pain. Concern for abscess. EXAM: CT ABDOMEN AND PELVIS WITHOUT CONTRAST TECHNIQUE: Multidetector CT imaging of the abdomen and pelvis was performed following the standard protocol without IV contrast. RADIATION DOSE REDUCTION: This exam was performed according to the departmental dose-optimization program which includes automated exposure control, adjustment of the mA and/or kV according to patient size and/or use of iterative reconstruction technique. COMPARISON:  None Available. FINDINGS: Evaluation of this exam is limited in the absence of intravenous contrast and due to  respiratory motion. Lower chest: The visualized lung bases are clear. No intra-abdominal free air or free fluid. Hepatobiliary: There is a 4.8 x 3.3 cm low attenuating collection along the inferior surface of the right lobe of the liver which may represent a phlegmon, developing abscess, or a hematoma. There is otherwise unremarkable. No biliary dilatation. The gallbladder is mildly distended. Tiny stones noted in the gallbladder. Mild thickened appearance of the gallbladder wall which may be related to respiratory motion. Ultrasound may provide better evaluation if there is clinical concern for acute gallbladder pathology. Pancreas: Unremarkable. No pancreatic ductal dilatation or surrounding inflammatory changes. Spleen: Normal in size without focal abnormality. Adrenals/Urinary Tract: The adrenal glands are unremarkable. Small right renal upper pole  cyst. Punctate bilateral nonobstructing renal calculi. No hydronephrosis. The visualized ureters are grossly unremarkable. The urinary bladder is mildly distended. Air within the bladder may have been introduced by recent instrumentation. A rectovesical fistula is less likely but not excluded. Stomach/Bowel: Postsurgical changes of the bowel with right anterior colostomy. There is no bowel obstruction. Oral contrast opacifies loops of small bowel. There is mixture of gas, and oral contrast in the pelvis extending to the posterior sacrum and skin surface. The amount of gas is significantly increased since the prior CT. Mixed density content in this region extending into the anal region and skin surface may represent fecal matter or packing material. The oral contrast however appears to extend outside of the confines of the bowel into the deep pelvis and perianal soft tissues. Evaluation is very limited due to distortion of the soft tissues and postsurgical changes. There is thickening of the presacral soft tissues with erosive changes of the anterior sacrum. There is a  large sacrococcygeal skin ulceration with extension of gas into the superficial soft tissues and bilateral gluteal musculature, left greater than right. Vascular/Lymphatic: The abdominal aorta and IVC are grossly unremarkable on this noncontrast CT. No portal venous gas. No adenopathy. Reproductive: The prostate gland is poorly visualized. Other: Diffuse subcutaneous edema of the pelvis, large sacrococcygeal ulceration and soft tissue gas as described above. Musculoskeletal: No acute fracture. IMPRESSION: 1. Postsurgical changes of the bowel with right anterior colostomy. No bowel obstruction. 2. Large sacrococcygeal skin ulceration with extension of gas into the superficial soft tissues and bilateral gluteal musculature, left greater than right. 3. Postsurgical changes in the pelvis with mixture of gas, and oral contrast extending from the rectal region into the posterior pelvis and skin surface. Evaluation of this area is very limited due to tissue distortion and postsurgical changes. There is however apparent extension of oral contrast outside of the confines of the bowel into the superficial soft tissues and skin suggestive of perforation or fistulization. Surgical consult is advised. 4. Large sacrococcygeal skin ulcer with extension of air into the soft tissues of the buttocks, gluteal musculature, and pelvis. Findings may represent necrotizing infection. 5. A 4.8 x 3.3 cm low attenuating collection along the inferior surface of the right lobe of the liver may represent a phlegmon, developing abscess, or a hematoma. 6. Punctate bilateral nonobstructing renal calculi. No hydronephrosis. 7. Cholelithiasis. Electronically Signed   By: Elgie Collard M.D.   On: 12/29/2023 14:50    Scheduled Meds:  carbamide peroxide  5 drop Both EARS BID   Chlorhexidine Gluconate Cloth  6 each Topical Q2200   docusate sodium  100 mg Oral BID   feeding supplement (GLUCERNA SHAKE)  237 mL Oral TID BM   insulin aspart  0-15  Units Subcutaneous Q4H   insulin glargine-yfgn  15 Units Subcutaneous Daily   magic mouthwash w/lidocaine  10 mL Oral TID   sodium chloride flush  10-40 mL Intracatheter Q12H   thiamine (VITAMIN B1) injection  100 mg Intravenous Q24H   Continuous Infusions:  piperacillin-tazobactam (ZOSYN)  IV 3.375 g (12/31/23 1008)   TPN ADULT (ION) 40 mL/hr at 12/31/23 0748     LOS: 6 days   Burnadette Pop, MD [facility] P4/11/2023, 11:14 AM

## 2023-12-31 NOTE — Anesthesia Preprocedure Evaluation (Signed)
 Anesthesia Evaluation  Patient identified by MRN, date of birth, ID band Patient awake    Reviewed: Allergy & Precautions, NPO status , Patient's Chart, lab work & pertinent test results  History of Anesthesia Complications Negative for: history of anesthetic complications  Airway Mallampati: III  TM Distance: >3 FB Neck ROM: Full   Comment: Previous grade I view with MAC 3, easy mask Dental  (+) Dental Advisory Given   Pulmonary neg shortness of breath, asthma , sleep apnea , neg COPD, neg recent URI, PE   Pulmonary exam normal breath sounds clear to auscultation       Cardiovascular hypertension, (-) angina + Peripheral Vascular Disease  (-) Past MI, (-) Cardiac Stents and (-) CABG Normal cardiovascular exam(-) dysrhythmias  Rhythm:Regular Rate:Normal  HLD  TTE 07/06/2020: IMPRESSIONS    1. Left ventricular ejection fraction, by estimation, is 55 to 60%. The  left ventricle has normal function. The left ventricle has no regional  wall motion abnormalities. Left ventricular diastolic parameters are  consistent with Grade I diastolic  dysfunction (impaired relaxation).   2. Right ventricular systolic function is mildly reduced. The right  ventricular size is mildly enlarged. There is mildly elevated pulmonary  artery systolic pressure.   3. The mitral valve is normal in structure. No evidence of mitral valve  regurgitation. No evidence of mitral stenosis.   4. The aortic valve is grossly normal. Aortic valve regurgitation is not  visualized. No aortic stenosis is present.   5. Aortic dilatation noted. There is borderline dilatation of the  ascending aorta, measuring 38 mm.   6. The inferior vena cava is dilated in size with <50% respiratory  variability, suggesting right atrial pressure of 15 mmHg.     Neuro/Psych neg Seizures PSYCHIATRIC DISORDERS Anxiety Depression     Neuromuscular disease (peripheral neuropathy)     GI/Hepatic Neg liver ROS,GERD  ,,Rectal cancer, diverting ileostomy   Endo/Other  diabetes, Poorly Controlled, Type 2, Oral Hypoglycemic Agents    Renal/GU Renal disease     Musculoskeletal   Abdominal   Peds  Hematology  (+) Blood dyscrasia, anemia Lab Results      Component                Value               Date                      WBC                      18.1 (H)            12/25/2023                HGB                      5.8 (LL)            12/25/2023                HCT                      20.5 (L)            12/25/2023                MCV                      70.0 (L)  12/25/2023                PLT                      664 (H)             12/25/2023              Anesthesia Other Findings 53 y.o. male with medical history significant for diabetes, VTE on Xarelto, stage II adenocarcinoma status post neoadjuvant concurrent chemoradiation 2022 followed by low anterior resection and complicated postoperative course with chronic pelvic abscess now being admitted to the hospital by the surgical service due to acute blood loss anemia from perirectal wound.    Last Xarelto:  Last Farxiga:  Reproductive/Obstetrics                              Anesthesia Physical Anesthesia Plan  ASA: 3  Anesthesia Plan: General   Post-op Pain Management: Ofirmev IV (intra-op)*   Induction: Intravenous  PONV Risk Score and Plan: 3 and Ondansetron, Dexamethasone, Treatment may vary due to age or medical condition and Midazolam  Airway Management Planned: Oral ETT  Additional Equipment:   Intra-op Plan:   Post-operative Plan: Extubation in OR  Informed Consent: I have reviewed the patients History and Physical, chart, labs and discussed the procedure including the risks, benefits and alternatives for the proposed anesthesia with the patient or authorized representative who has indicated his/her understanding and acceptance.     Dental advisory  given  Plan Discussed with: CRNA  Anesthesia Plan Comments: (Risks of anesthesia explained at length. This includes, but is not limited to, sore throat, damage to teeth, lips gums, tongue and vocal cords, nausea and vomiting, reactions to medications, stroke, heart attack, and death. All patient questions were answered and the patient wishes to proceed.  )        Anesthesia Quick Evaluation

## 2023-12-31 NOTE — TOC Benefit Eligibility Note (Addendum)
 Pharmacy Patient Advocate Encounter  Insurance verification completed.    The patient is insured through [facility]. Patient has [employer], may use a copay card, and/or apply for patient assistance if available.    Ran test claim for [employer] and the current 30 day co-pay is $74.99.  Ran test claim for Dexcom G7 sensors and the current 30 day co-pay is requires a prior authorization.  Prior authorization approved for Dexcom G7 sensor and copay is $30.  Approval #[id number] 12/01/23 to 12/30/24  This test claim was processed through [facility]- copay amounts may vary at other pharmacies due to pharmacy/plan contracts, or as the patient moves through the different stages of their insurance plan.

## 2023-12-31 NOTE — H&P (View-Only) (Signed)
 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  Plan return to OR tomorrow for dressing change (VAC).        Darnell Level, MD [facility] practice Office: [phone]        Chief Complaint: Perineal wound, infection  Subjective: Patient in bed, relatively comfortable, alert.  Objective: Vital signs in last 24 hours: Temp:  [97.6 F (36.4 C)-98.5 F (36.9 C)] 97.8 F (36.6 C) (04/03 0800) Pulse Rate:  [78-117] 89 (04/03 0900) Resp:  [9-23] 15 (04/03 0900) BP: (90-138)/(50-86) 119/64 (04/03 0900) SpO2:  [94 %-100 %] 100 % (04/03 0900) Weight:  [79.2 kg-81.5 kg] 79.2 kg (04/03 0300) Last BM Date : 12/31/23  Intake/Output from previous day: 04/02 0701 - 04/03 0700 In: 1182.6 [P.O.:60; I.V.:1039.5; IV Piggyback:83.1] Out: 1400 [Urine:1400] Intake/Output this shift: Total I/O In: 72 [I.V.:72] Out: -   Physical Exam: HEENT - sclerae clear, mucous membranes moist VAC dressing function normal, thin serosanguinous in reservoir  Lab Results:  Recent Labs    12/29/23 0546 12/29/23 1812 12/30/23 0628  WBC 34.8*  --  19.2*  HGB 8.0* 9.1* 7.9*  HCT 26.0* 28.6* 24.1*  PLT 733*  --  490*   BMET Recent Labs    12/30/23 0623 12/31/23 0617  NA 130* 131*  K 4.8 4.2  CL 96* 98  CO2 24 23  GLUCOSE 251* 257*  BUN 27* 29*  CREATININE 0.85 0.85  CALCIUM 7.9* 7.7*   PT/INR Recent Labs    12/29/23 1812 12/30/23 0628  LABPROT 15.5* 16.0*  INR 1.2 1.3*   Comprehensive Metabolic Panel:    Component Value Date/Time   NA 131 (L) 12/31/2023 0617   NA 130 (L) 12/30/2023 0623   K 4.2 12/31/2023 0617   K 4.8 12/30/2023 0623   CL 98  12/31/2023 0617   CL 96 (L) 12/30/2023 0623   CO2 23 12/31/2023 0617   CO2 24 12/30/2023 0623   BUN 29 (H) 12/31/2023 0617   BUN 27 (H) 12/30/2023 0623   CREATININE 0.85 12/31/2023 0617   CREATININE 0.85 12/30/2023 0623   CREATININE 1.09 10/27/2023 1014   CREATININE 1.17 04/08/2023 1234   GLUCOSE 257 (H) 12/31/2023 0617   GLUCOSE 251 (H) 12/30/2023 0623   CALCIUM 7.7 (L) 12/31/2023 0617   CALCIUM 7.9 (L) 12/30/2023 0623   AST 10 (L) 12/31/2023 0617   AST 13 (L) 12/30/2023 0623   AST 11 (L) 10/27/2023 1014   AST 8 (L) 04/08/2023 1234   ALT 10 12/31/2023 0617   ALT 11 12/30/2023 0623   ALT 11 10/27/2023 1014   ALT 7 04/08/2023 1234   ALKPHOS 89 12/31/2023 0617   ALKPHOS 98 12/30/2023 0623   BILITOT 0.5 12/31/2023 0617   BILITOT 0.5 12/30/2023 0623   BILITOT 0.5 10/27/2023 1014   BILITOT 0.4 04/08/2023 1234   PROT 5.6 (L) 12/31/2023 0617   PROT 5.9 (L) 12/30/2023 0623   ALBUMIN 1.5 (L) 12/31/2023 0617   ALBUMIN 1.6 (L) 12/30/2023 0623    Studies/Results: [place] EKG SITE RITE Result Date: 12/30/2023 If Site Rite image not attached, placement could not be confirmed due to current cardiac rhythm.  CT ABDOMEN PELVIS WO  CONTRAST Result Date: 12/29/2023 CLINICAL DATA:  History of rectal cancer. Left buttock pain. Concern for abscess. EXAM: CT ABDOMEN AND PELVIS WITHOUT CONTRAST TECHNIQUE: Multidetector CT imaging of the abdomen and pelvis was performed following the standard protocol without IV contrast. RADIATION DOSE REDUCTION: This exam was performed according to the departmental dose-optimization program which includes automated exposure control, adjustment of the mA and/or kV according to patient size and/or use of iterative reconstruction technique. COMPARISON:  None Available. FINDINGS: Evaluation of this exam is limited in the absence of intravenous contrast and due to respiratory motion. Lower chest: The visualized lung bases are clear. No intra-abdominal free air or free fluid.  Hepatobiliary: There is a 4.8 x 3.3 cm low attenuating collection along the inferior surface of the right lobe of the liver which may represent a phlegmon, developing abscess, or a hematoma. There is otherwise unremarkable. No biliary dilatation. The gallbladder is mildly distended. Tiny stones noted in the gallbladder. Mild thickened appearance of the gallbladder wall which may be related to respiratory motion. Ultrasound may provide better evaluation if there is clinical concern for acute gallbladder pathology. Pancreas: Unremarkable. No pancreatic ductal dilatation or surrounding inflammatory changes. Spleen: Normal in size without focal abnormality. Adrenals/Urinary Tract: The adrenal glands are unremarkable. Small right renal upper pole cyst. Punctate bilateral nonobstructing renal calculi. No hydronephrosis. The visualized ureters are grossly unremarkable. The urinary bladder is mildly distended. Air within the bladder may have been introduced by recent instrumentation. A rectovesical fistula is less likely but not excluded. Stomach/Bowel: Postsurgical changes of the bowel with right anterior colostomy. There is no bowel obstruction. Oral contrast opacifies loops of small bowel. There is mixture of gas, and oral contrast in the pelvis extending to the posterior sacrum and skin surface. The amount of gas is significantly increased since the prior CT. Mixed density content in this region extending into the anal region and skin surface may represent fecal matter or packing material. The oral contrast however appears to extend outside of the confines of the bowel into the deep pelvis and perianal soft tissues. Evaluation is very limited due to distortion of the soft tissues and postsurgical changes. There is thickening of the presacral soft tissues with erosive changes of the anterior sacrum. There is a large sacrococcygeal skin ulceration with extension of gas into the superficial soft tissues and bilateral  gluteal musculature, left greater than right. Vascular/Lymphatic: The abdominal aorta and IVC are grossly unremarkable on this noncontrast CT. No portal venous gas. No adenopathy. Reproductive: The prostate gland is poorly visualized. Other: Diffuse subcutaneous edema of the pelvis, large sacrococcygeal ulceration and soft tissue gas as described above. Musculoskeletal: No acute fracture. IMPRESSION: 1. Postsurgical changes of the bowel with right anterior colostomy. No bowel obstruction. 2. Large sacrococcygeal skin ulceration with extension of gas into the superficial soft tissues and bilateral gluteal musculature, left greater than right. 3. Postsurgical changes in the pelvis with mixture of gas, and oral contrast extending from the rectal region into the posterior pelvis and skin surface. Evaluation of this area is very limited due to tissue distortion and postsurgical changes. There is however apparent extension of oral contrast outside of the confines of the bowel into the superficial soft tissues and skin suggestive of perforation or fistulization. Surgical consult is advised. 4. Large sacrococcygeal skin ulcer with extension of air into the soft tissues of the buttocks, gluteal musculature, and pelvis. Findings may represent necrotizing infection. 5. A 4.8 x 3.3 cm low attenuating collection along the inferior  surface of the right lobe of the liver may represent a phlegmon, developing abscess, or a hematoma. 6. Punctate bilateral nonobstructing renal calculi. No hydronephrosis. 7. Cholelithiasis. Electronically Signed   By: Elgie Collard M.D.   On: 12/29/2023 14:50      Darnell Level 12/31/2023  Patient ID: [name], male   DOB: 11/21/1970, 53 y.o.   MRN: [id number]

## 2023-12-31 NOTE — Progress Notes (Signed)
 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  Plan return to OR tomorrow for dressing change (VAC).        Steven Level, MD [facility] practice Office: [phone]        Chief Complaint: Perineal wound, infection  Subjective: Patient in bed, relatively comfortable, alert.  Objective: Vital signs in last 24 hours: Temp:  [97.6 F (36.4 C)-98.5 F (36.9 C)] 97.8 F (36.6 C) (04/03 0800) Pulse Rate:  [78-117] 89 (04/03 0900) Resp:  [9-23] 15 (04/03 0900) BP: (90-138)/(50-86) 119/64 (04/03 0900) SpO2:  [94 %-100 %] 100 % (04/03 0900) Weight:  [79.2 kg-81.5 kg] 79.2 kg (04/03 0300) Last BM Date : 12/31/23  Intake/Output from previous day: 04/02 0701 - 04/03 0700 In: 1182.6 [P.O.:60; I.V.:1039.5; IV Piggyback:83.1] Out: 1400 [Urine:1400] Intake/Output this shift: Total I/O In: 72 [I.V.:72] Out: -   Physical Exam: HEENT - sclerae clear, mucous membranes moist VAC dressing function normal, thin serosanguinous in reservoir  Lab Results:  Recent Labs    12/29/23 0546 12/29/23 1812 12/30/23 0628  WBC 34.8*  --  19.2*  HGB 8.0* 9.1* 7.9*  HCT 26.0* 28.6* 24.1*  PLT 733*  --  490*   BMET Recent Labs    12/30/23 0623 12/31/23 0617  NA 130* 131*  K 4.8 4.2  CL 96* 98  CO2 24 23  GLUCOSE 251* 257*  BUN 27* 29*  CREATININE 0.85 0.85  CALCIUM 7.9* 7.7*   PT/INR Recent Labs    12/29/23 1812 12/30/23 0628  LABPROT 15.5* 16.0*  INR 1.2 1.3*   Comprehensive Metabolic Panel:    Component Value Date/Time   NA 131 (L) 12/31/2023 0617   NA 130 (L) 12/30/2023 0623   K 4.2 12/31/2023 0617   K 4.8 12/30/2023 0623   CL 98  12/31/2023 0617   CL 96 (L) 12/30/2023 0623   CO2 23 12/31/2023 0617   CO2 24 12/30/2023 0623   BUN 29 (H) 12/31/2023 0617   BUN 27 (H) 12/30/2023 0623   CREATININE 0.85 12/31/2023 0617   CREATININE 0.85 12/30/2023 0623   CREATININE 1.09 10/27/2023 1014   CREATININE 1.17 04/08/2023 1234   GLUCOSE 257 (H) 12/31/2023 0617   GLUCOSE 251 (H) 12/30/2023 0623   CALCIUM 7.7 (L) 12/31/2023 0617   CALCIUM 7.9 (L) 12/30/2023 0623   AST 10 (L) 12/31/2023 0617   AST 13 (L) 12/30/2023 0623   AST 11 (L) 10/27/2023 1014   AST 8 (L) 04/08/2023 1234   ALT 10 12/31/2023 0617   ALT 11 12/30/2023 0623   ALT 11 10/27/2023 1014   ALT 7 04/08/2023 1234   ALKPHOS 89 12/31/2023 0617   ALKPHOS 98 12/30/2023 0623   BILITOT 0.5 12/31/2023 0617   BILITOT 0.5 12/30/2023 0623   BILITOT 0.5 10/27/2023 1014   BILITOT 0.4 04/08/2023 1234   PROT 5.6 (L) 12/31/2023 0617   PROT 5.9 (L) 12/30/2023 0623   ALBUMIN 1.5 (L) 12/31/2023 0617   ALBUMIN 1.6 (L) 12/30/2023 0623    Studies/Results: [place] EKG SITE RITE Result Date: 12/30/2023 If Site Rite image not attached, placement could not be confirmed due to current cardiac rhythm.  CT ABDOMEN PELVIS WO  CONTRAST Result Date: 12/29/2023 CLINICAL DATA:  History of rectal cancer. Left buttock pain. Concern for abscess. EXAM: CT ABDOMEN AND PELVIS WITHOUT CONTRAST TECHNIQUE: Multidetector CT imaging of the abdomen and pelvis was performed following the standard protocol without IV contrast. RADIATION DOSE REDUCTION: This exam was performed according to the departmental dose-optimization program which includes automated exposure control, adjustment of the mA and/or kV according to patient size and/or use of iterative reconstruction technique. COMPARISON:  None Available. FINDINGS: Evaluation of this exam is limited in the absence of intravenous contrast and due to respiratory motion. Lower chest: The visualized lung bases are clear. No intra-abdominal free air or free fluid.  Hepatobiliary: There is a 4.8 x 3.3 cm low attenuating collection along the inferior surface of the right lobe of the liver which may represent a phlegmon, developing abscess, or a hematoma. There is otherwise unremarkable. No biliary dilatation. The gallbladder is mildly distended. Tiny stones noted in the gallbladder. Mild thickened appearance of the gallbladder wall which may be related to respiratory motion. Ultrasound may provide better evaluation if there is clinical concern for acute gallbladder pathology. Pancreas: Unremarkable. No pancreatic ductal dilatation or surrounding inflammatory changes. Spleen: Normal in size without focal abnormality. Adrenals/Urinary Tract: The adrenal glands are unremarkable. Small right renal upper pole cyst. Punctate bilateral nonobstructing renal calculi. No hydronephrosis. The visualized ureters are grossly unremarkable. The urinary bladder is mildly distended. Air within the bladder may have been introduced by recent instrumentation. A rectovesical fistula is less likely but not excluded. Stomach/Bowel: Postsurgical changes of the bowel with right anterior colostomy. There is no bowel obstruction. Oral contrast opacifies loops of small bowel. There is mixture of gas, and oral contrast in the pelvis extending to the posterior sacrum and skin surface. The amount of gas is significantly increased since the prior CT. Mixed density content in this region extending into the anal region and skin surface may represent fecal matter or packing material. The oral contrast however appears to extend outside of the confines of the bowel into the deep pelvis and perianal soft tissues. Evaluation is very limited due to distortion of the soft tissues and postsurgical changes. There is thickening of the presacral soft tissues with erosive changes of the anterior sacrum. There is a large sacrococcygeal skin ulceration with extension of gas into the superficial soft tissues and bilateral  gluteal musculature, left greater than right. Vascular/Lymphatic: The abdominal aorta and IVC are grossly unremarkable on this noncontrast CT. No portal venous gas. No adenopathy. Reproductive: The prostate gland is poorly visualized. Other: Diffuse subcutaneous edema of the pelvis, large sacrococcygeal ulceration and soft tissue gas as described above. Musculoskeletal: No acute fracture. IMPRESSION: 1. Postsurgical changes of the bowel with right anterior colostomy. No bowel obstruction. 2. Large sacrococcygeal skin ulceration with extension of gas into the superficial soft tissues and bilateral gluteal musculature, left greater than right. 3. Postsurgical changes in the pelvis with mixture of gas, and oral contrast extending from the rectal region into the posterior pelvis and skin surface. Evaluation of this area is very limited due to tissue distortion and postsurgical changes. There is however apparent extension of oral contrast outside of the confines of the bowel into the superficial soft tissues and skin suggestive of perforation or fistulization. Surgical consult is advised. 4. Large sacrococcygeal skin ulcer with extension of air into the soft tissues of the buttocks, gluteal musculature, and pelvis. Findings may represent necrotizing infection. 5. A 4.8 x 3.3 cm low attenuating collection along the inferior  surface of the right lobe of the liver may represent a phlegmon, developing abscess, or a hematoma. 6. Punctate bilateral nonobstructing renal calculi. No hydronephrosis. 7. Cholelithiasis. Electronically Signed   By: Elgie Collard M.D.   On: 12/29/2023 14:50      Steven [name] 12/31/2023  Patient ID: Steven [name], male   DOB: 11/21/1970, 53 y.o.   MRN: [id number]

## 2023-12-31 NOTE — Plan of Care (Signed)
 Patient understands and able to teach back plan of care

## 2024-01-01 ENCOUNTER — Encounter (HOSPITAL_COMMUNITY): Payer: Self-pay

## 2024-01-01 ENCOUNTER — Encounter (HOSPITAL_COMMUNITY): Admission: EM | Disposition: A | Discharge status: Skilled Nursing Facility | Payer: Self-pay | Source: Home / Self Care

## 2024-01-01 ENCOUNTER — Inpatient Hospital Stay (HOSPITAL_COMMUNITY): Payer: Self-pay | Admitting: Anesthesiology

## 2024-01-01 DIAGNOSIS — D62 Acute posthemorrhagic anemia: Secondary | ICD-10-CM | POA: Diagnosis not present

## 2024-01-01 HISTORY — PX: IRRIGATION AND DEBRIDEMENT ABSCESS: SHX5252

## 2024-01-01 LAB — POCT I-STAT, CHEM 8
BUN: 19 mg/dL (ref 6–20)
BUN: 20 mg/dL (ref 6–20)
Calcium, Ion: 1.12 mmol/L — ABNORMAL LOW (ref 1.15–1.40)
Calcium, Ion: 1.12 mmol/L — ABNORMAL LOW (ref 1.15–1.40)
Chloride: 101 mmol/L (ref 98–111)
Chloride: 101 mmol/L (ref 98–111)
Creatinine, Ser: 0.7 mg/dL (ref 0.61–1.24)
Creatinine, Ser: 0.8 mg/dL (ref 0.61–1.24)
Glucose, Bld: 193 mg/dL — ABNORMAL HIGH (ref 70–99)
Glucose, Bld: 667 mg/dL (ref 70–99)
HCT: 22 % — ABNORMAL LOW (ref 39.0–52.0)
HCT: 24 % — ABNORMAL LOW (ref 39.0–52.0)
Hemoglobin: 7.5 g/dL — ABNORMAL LOW (ref 13.0–17.0)
Hemoglobin: 8.2 g/dL — ABNORMAL LOW (ref 13.0–17.0)
Potassium: 4.3 mmol/L (ref 3.5–5.1)
Potassium: 6.2 mmol/L — ABNORMAL HIGH (ref 3.5–5.1)
Sodium: 134 mmol/L — ABNORMAL LOW (ref 135–145)
Sodium: 135 mmol/L (ref 135–145)
TCO2: 24 mmol/L (ref 22–32)
TCO2: 25 mmol/L (ref 22–32)

## 2024-01-01 LAB — GLUCOSE, CAPILLARY
Glucose-Capillary: 150 mg/dL — ABNORMAL HIGH (ref 70–99)
Glucose-Capillary: 148 mg/dL — ABNORMAL HIGH (ref 70–99)
Glucose-Capillary: 164 mg/dL — ABNORMAL HIGH (ref 70–99)
Glucose-Capillary: 189 mg/dL — ABNORMAL HIGH (ref 70–99)
Glucose-Capillary: 200 mg/dL — ABNORMAL HIGH (ref 70–99)
Glucose-Capillary: 194 mg/dL — ABNORMAL HIGH (ref 70–99)
Glucose-Capillary: 267 mg/dL — ABNORMAL HIGH (ref 70–99)
Glucose-Capillary: 220 mg/dL — ABNORMAL HIGH (ref 70–99)

## 2024-01-01 LAB — BASIC METABOLIC PANEL WITH GFR
Anion gap: 8 (ref 5–15)
BUN: 25 mg/dL — ABNORMAL HIGH (ref 6–20)
CO2: 26 mmol/L (ref 22–32)
Calcium: 7.7 mg/dL — ABNORMAL LOW (ref 8.9–10.3)
Chloride: 101 mmol/L (ref 98–111)
Creatinine, Ser: 0.7 mg/dL (ref 0.61–1.24)
GFR, Estimated: >60 mL/min (ref >60)
Glucose, Bld: 145 mg/dL — ABNORMAL HIGH (ref 70–99)
Potassium: 3.9 mmol/L (ref 3.5–5.1)
Sodium: 135 mmol/L (ref 135–145)

## 2024-01-01 LAB — CBC
HCT: 20.3 % — ABNORMAL LOW (ref 39.0–52.0)
Hemoglobin: 6.5 g/dL — CL (ref 13.0–17.0)
MCH: 26.5 pg (ref 26.0–34.0)
MCHC: 32 g/dL (ref 30.0–36.0)
MCV: 82.9 fL (ref 80.0–100.0)
Platelets: 462 10*3/uL — ABNORMAL HIGH (ref 150–400)
RBC: 2.45 MIL/uL — ABNORMAL LOW (ref 4.22–5.81)
RDW: 22.6 % — ABNORMAL HIGH (ref 11.5–15.5)
WBC: 11.1 10*3/uL — ABNORMAL HIGH (ref 4.0–10.5)
nRBC: 0.2 % (ref 0.0–0.2)

## 2024-01-01 LAB — HEMOGLOBIN AND HEMATOCRIT, BLOOD
HCT: 27.8 % — ABNORMAL LOW (ref 39.0–52.0)
Hemoglobin: 9.1 g/dL — ABNORMAL LOW (ref 13.0–17.0)

## 2024-01-01 LAB — PREPARE RBC (CROSSMATCH)

## 2024-01-01 SURGERY — IRRIGATION AND DEBRIDEMENT ABSCESS
Anesthesia: General

## 2024-01-01 MED ORDER — HYDROMORPHONE HCL 1 MG/ML IJ SOLN
INTRAMUSCULAR | Status: DC | PRN
  Administered 2024-01-01: 1 mg via INTRAVENOUS

## 2024-01-01 MED ORDER — DEXAMETHASONE SODIUM PHOSPHATE 10 MG/ML IJ SOLN
INTRAMUSCULAR | Status: DC | PRN
  Administered 2024-01-01: 4 mg via INTRAVENOUS

## 2024-01-01 MED ORDER — FENTANYL CITRATE (PF) 250 MCG/5ML IJ SOLN
INTRAMUSCULAR | Status: DC | PRN
Start: 2024-01-01 — End: 2024-01-01
  Administered 2024-01-01: 100 ug via INTRAVENOUS
  Administered 2024-01-01: 150 ug via INTRAVENOUS

## 2024-01-01 MED ORDER — ONDANSETRON HCL 4 MG/2ML IJ SOLN
INTRAMUSCULAR | Status: DC | PRN
  Administered 2024-01-01: 4 mg via INTRAVENOUS

## 2024-01-01 MED ORDER — HYDROMORPHONE HCL 1 MG/ML IJ SOLN
0.2500 mg | INTRAMUSCULAR | Status: DC | PRN
  Administered 2024-01-01: 0.5 mg via INTRAVENOUS

## 2024-01-01 MED ORDER — SODIUM CHLORIDE 0.9% IV SOLUTION: Freq: Once | INTRAVENOUS | Status: AC

## 2024-01-01 MED ORDER — SODIUM CHLORIDE 0.9% IV SOLUTION: Freq: Once | INTRAVENOUS | Status: DC

## 2024-01-01 MED ORDER — MIDAZOLAM HCL 2 MG/2ML IJ SOLN
INTRAMUSCULAR | Status: AC
  Filled 2024-01-01: qty 2

## 2024-01-01 MED ORDER — TRAVASOL 10 % IV SOLN
INTRAVENOUS | Status: AC
  Filled 2024-01-01: qty 763.2

## 2024-01-01 MED ORDER — 0.9 % SODIUM CHLORIDE (POUR BTL) OPTIME
TOPICAL | Status: DC | PRN
  Administered 2024-01-01: 1000 mL

## 2024-01-01 MED ORDER — KETAMINE HCL 50 MG/5ML IJ SOSY
PREFILLED_SYRINGE | INTRAMUSCULAR | Status: DC | PRN
  Administered 2024-01-01: 30 mg via INTRAVENOUS

## 2024-01-01 MED ORDER — MEPERIDINE HCL 50 MG/ML IJ SOLN: 6.2500 mg | INTRAMUSCULAR | Status: DC | PRN

## 2024-01-01 MED ORDER — KETAMINE HCL 50 MG/5ML IJ SOSY
PREFILLED_SYRINGE | INTRAMUSCULAR | Status: AC
  Filled 2024-01-01: qty 5

## 2024-01-01 MED ORDER — MIDAZOLAM HCL 2 MG/2ML IJ SOLN
INTRAMUSCULAR | Status: DC | PRN
  Administered 2024-01-01: 2 mg via INTRAVENOUS

## 2024-01-01 MED ORDER — ONDANSETRON HCL 4 MG/2ML IJ SOLN
INTRAMUSCULAR | Status: AC
  Filled 2024-01-01: qty 2

## 2024-01-01 MED ORDER — FENTANYL CITRATE (PF) 250 MCG/5ML IJ SOLN
INTRAMUSCULAR | Status: AC
  Filled 2024-01-01: qty 5

## 2024-01-01 MED ORDER — SUGAMMADEX SODIUM 200 MG/2ML IV SOLN
INTRAVENOUS | Status: DC | PRN
  Administered 2024-01-01: 200 mg via INTRAVENOUS

## 2024-01-01 MED ORDER — ROCURONIUM BROMIDE 10 MG/ML (PF) SYRINGE
PREFILLED_SYRINGE | INTRAVENOUS | Status: DC | PRN
  Administered 2024-01-01: 50 mg via INTRAVENOUS

## 2024-01-01 MED ORDER — HYDROMORPHONE HCL 1 MG/ML IJ SOLN
INTRAMUSCULAR | Status: AC
  Administered 2024-01-01: 0.5 mg via INTRAVENOUS
  Filled 2024-01-01: qty 1

## 2024-01-01 MED ORDER — HYDROMORPHONE HCL 2 MG/ML IJ SOLN
INTRAMUSCULAR | Status: AC
  Filled 2024-01-01: qty 1

## 2024-01-01 MED ORDER — PROPOFOL 10 MG/ML IV BOLUS
INTRAVENOUS | Status: AC
  Filled 2024-01-01: qty 20

## 2024-01-01 MED ORDER — DROPERIDOL 2.5 MG/ML IJ SOLN: 0.6250 mg | Freq: Once | INTRAMUSCULAR | Status: DC | PRN

## 2024-01-01 MED ORDER — PROPOFOL 10 MG/ML IV BOLUS
INTRAVENOUS | Status: DC | PRN
  Administered 2024-01-01: 100 mg via INTRAVENOUS

## 2024-01-01 MED ORDER — LIDOCAINE HCL (CARDIAC) PF 100 MG/5ML IV SOSY
PREFILLED_SYRINGE | INTRAVENOUS | Status: DC | PRN
  Administered 2024-01-01: 30 mg via INTRAVENOUS

## 2024-01-01 MED ORDER — LACTATED RINGERS IV SOLN: INTRAVENOUS | Status: DC | PRN

## 2024-01-01 SURGICAL SUPPLY — 35 items
BAG COUNTER SPONGE SURGICOUNT (BAG) IMPLANT
BLADE HEX COATED 2.75 (ELECTRODE) ×1 IMPLANT
BLADE SURG SZ10 CARB STEEL (BLADE) ×1 IMPLANT
CHLORAPREP W/TINT 26 (MISCELLANEOUS) ×1 IMPLANT
COVER SURGICAL LIGHT HANDLE (MISCELLANEOUS) ×1 IMPLANT
DRAIN CHANNEL RND F F (WOUND CARE) IMPLANT
DRAPE DERMATAC (DRAPES) IMPLANT
DRAPE LAPAROTOMY T 102X78X121 (DRAPES) IMPLANT
DRAPE LAPAROTOMY TRNSV 102X78 (DRAPES) IMPLANT
DRAPE SHEET LG 3/4 BI-LAMINATE (DRAPES) IMPLANT
DRSG VAC GRANUFOAM LG (GAUZE/BANDAGES/DRESSINGS) IMPLANT
DRSG VERSA FOAM LRG 10X15 (GAUZE/BANDAGES/DRESSINGS) IMPLANT
ELECT REM PT RETURN 15FT ADLT (MISCELLANEOUS) ×1 IMPLANT
EVACUATOR SILICONE 100CC (DRAIN) IMPLANT
GAUZE SPONGE 4X4 12PLY STRL (GAUZE/BANDAGES/DRESSINGS) ×1 IMPLANT
GLOVE BIOGEL PI IND STRL 7.0 (GLOVE) ×1 IMPLANT
GLOVE SURG ORTHO 8.0 STRL STRW (GLOVE) ×1 IMPLANT
GLOVE SURG SYN 7.5 E (GLOVE) ×1 IMPLANT
GLOVE SURG SYN 7.5 PF PI (GLOVE) ×1 IMPLANT
GOWN STRL REUS W/ TWL XL LVL3 (GOWN DISPOSABLE) ×2 IMPLANT
KIT BASIN OR (CUSTOM PROCEDURE TRAY) ×1 IMPLANT
KIT TURNOVER KIT A (KITS) IMPLANT
MARKER SKIN DUAL TIP RULER LAB (MISCELLANEOUS) IMPLANT
NDL HYPO 25X1 1.5 SAFETY (NEEDLE) ×1 IMPLANT
NEEDLE HYPO 25X1 1.5 SAFETY (NEEDLE) ×1 IMPLANT
NS IRRIG 1000ML POUR BTL (IV SOLUTION) ×1 IMPLANT
PACK GENERAL/GYN (CUSTOM PROCEDURE TRAY) ×1 IMPLANT
SPIKE FLUID TRANSFER (MISCELLANEOUS) IMPLANT
STAPLER SKIN PROX WIDE 3.9 (STAPLE) IMPLANT
STRIP CLOSURE SKIN 1/2X4 (GAUZE/BANDAGES/DRESSINGS) IMPLANT
SUT ETHILON 3 0 PS 1 (SUTURE) IMPLANT
SUT MNCRL AB 4-0 PS2 18 (SUTURE) IMPLANT
SUT VIC AB 3-0 SH 18 (SUTURE) IMPLANT
SYR CONTROL 10ML LL (SYRINGE) ×1 IMPLANT
TOWEL OR 17X26 10 PK STRL BLUE (TOWEL DISPOSABLE) ×1 IMPLANT

## 2024-01-01 NOTE — Anesthesia Procedure Notes (Signed)
 Procedure Name: Intubation Date/Time: 01/01/2024 3:39 PM  Performed by: Elyn Peers, CRNAPre-anesthesia Checklist: Patient identified, Emergency Drugs available, Suction available, Patient being monitored and Timeout performed Patient Re-evaluated:Patient Re-evaluated prior to induction Oxygen Delivery Method: Circle system utilized Preoxygenation: Pre-oxygenation with 100% oxygen Induction Type: IV induction Ventilation: Mask ventilation without difficulty Laryngoscope Size: Mac and 4 Grade View: Grade I Tube type: Oral Tube size: 7.5 mm Number of attempts: 1 Airway Equipment and Method: Stylet Placement Confirmation: ETT inserted through vocal cords under direct vision, positive ETCO2 and breath sounds checked- equal and bilateral Secured at: 24 cm Tube secured with: Tape Dental Injury: Teeth and Oropharynx as per pre-operative assessment

## 2024-01-01 NOTE — Progress Notes (Signed)
 Patient still has NPO orders; called Dr. Magnus Ivan (on call [facility] surgeon) to request clear liquid diet return order. Per Dr. Magnus Ivan verbal order for clear liquid diet. Order placed

## 2024-01-01 NOTE — Progress Notes (Signed)
 New wound vac canister replaced on 4/3, pt had of drainage overnight. Hgb dropped to 6.5. Provider notified, 1 U PRBC ordered.

## 2024-01-01 NOTE — TOC Progression Note (Signed)
 Transition of Care [facility]) - Progression Note    Patient Details  Name: Coden Franchi MRN: [id number] Date of Birth: June 07, 1971  Transition of Care [facility]) CM/SW Contact  Adrian Prows, RN Phone Number: 01/01/2024, 4:02 PM  Clinical Narrative:    Pt to OR for debridement and wound vac dressing change; TOC is following.     Barriers to Discharge: Continued Medical Work up  Expected Discharge Plan and Services In-house Referral: NA   Post Acute Care Choice: Resumption of Svcs/PTA Provider Living arrangements for the past 2 months: Single Family Home                 DME Arranged: N/A DME Agency: NA                   Social Determinants of Health (SDOH) Interventions SDOH Screenings   Food Insecurity: No Food Insecurity (12/26/2023)  Housing: Low Risk  (12/26/2023)  Transportation Needs: No Transportation Needs (12/26/2023)  Utilities: Not At Risk (12/26/2023)  Social Connections: Socially Integrated (11/30/2023)  Tobacco Use: Low Risk  (01/01/2024)    Readmission Risk Interventions    12/27/2023    9:47 AM 12/07/2023    2:23 PM  Readmission Risk Prevention Plan  Transportation Screening Complete Complete  PCP or Specialist Appt within 5-7 Days  Complete  PCP or Specialist Appt within 3-5 Days Complete   Home Care Screening  Complete  Medication Review (RN CM)  Complete  HRI or Home Care Consult Complete   Social Work Consult for Recovery Care Planning/Counseling Complete   Palliative Care Screening Not Applicable   Medication Review Oceanographer) Complete

## 2024-01-01 NOTE — Anesthesia Postprocedure Evaluation (Signed)
 Anesthesia Post Note  Patient: Doren Kaspar  Procedure(s) Performed: IRRIGATION AND DEBRIDEMENT ABSCESS     Patient location during evaluation: PACU Anesthesia Type: General Level of consciousness: awake and alert Pain management: pain level controlled Vital Signs Assessment: post-procedure vital signs reviewed and stable Respiratory status: spontaneous breathing, nonlabored ventilation, respiratory function stable and patient connected to nasal cannula oxygen Cardiovascular status: blood pressure returned to baseline and stable Postop Assessment: no apparent nausea or vomiting Anesthetic complications: no  No notable events documented.  Last Vitals:  Vitals:   01/01/24 1715 01/01/24 1800  BP: 122/65 113/71  Pulse: (!) 105 (!) 101  Resp: 14 13  Temp: 37.4 C   SpO2: 92% 95%    Last Pain:  Vitals:   01/01/24 1850  TempSrc:   PainSc: 6                  Shelton Silvas

## 2024-01-01 NOTE — Progress Notes (Signed)
 This nurse reached out to Bessie, RN with PACU to assist in informing Dr. Gerrit Friends of an abscess on patient's RLQ to ensure the surgeon knew it was there in case surgical needs were necessary before patient goes down for surgery this day. Awaiting response from provider on follow up action.

## 2024-01-01 NOTE — Interval H&P Note (Signed)
 History and Physical Interval Note:  01/01/2024 3:27 PM  Steven [name]  has presented today for surgery, with the diagnosis of WOUND INFECTION.  The various methods of treatment have been discussed with the patient and family. After consideration of risks, benefits and other options for treatment, the patient has consented to    Procedure(s): IRRIGATION AND DEBRIDEMENT ABSCESS (N/A) as a surgical intervention.    The patient's history has been reviewed, patient examined, no change in status, stable for surgery.  I have reviewed the patient's chart and labs.  Questions were answered to the patient's satisfaction.    Darnell Level, MD [facility] practice Office: [phone]   Darnell Level

## 2024-01-01 NOTE — Plan of Care (Signed)
  Problem: Tissue Perfusion: Goal: Adequacy of tissue perfusion will improve Outcome: Progressing   Problem: Education: Goal: Knowledge of General Education information will improve Description: Including pain rating scale, medication(s)/side effects and non-pharmacologic comfort measures Outcome: Progressing   Problem: Coping: Goal: Level of anxiety will decrease Outcome: Progressing   Problem: Elimination: Goal: Will not experience complications related to urinary retention Outcome: Progressing   Problem: Nutrition: Goal: Adequate nutrition will be maintained Outcome: Not Progressing   Problem: Clinical Measurements: Goal: Respiratory complications will improve Outcome: Adequate for Discharge Goal: Cardiovascular complication will be avoided Outcome: Adequate for Discharge

## 2024-01-01 NOTE — Op Note (Signed)
 Operative Note  Pre-operative Diagnosis:  open wound of perineum   Post-operative Diagnosis:  same  Surgeon:  Darnell Level, MD  Assistant:  none   Procedure:  wound exploration, VAC dressing change  Anesthesia:  general  Estimated Blood Loss:  minimal  Drains: VAC dressing with black and white sponges         Specimen: none  Indications: Patient is a 54 year old male with a history of adenocarcinoma.  Patient now has a large perineal wound which has been managed with sequential debridements and dressing changes.  Patient has a VAC dressing in place and comes to the operating room at this time for dressing change.  Procedure:  The patient was seen in the pre-op holding area. The risks, benefits, complications, treatment options, and expected outcomes were previously discussed with the patient. The patient agreed with the proposed plan and has signed the informed consent form.  The patient was brought to the operating room by the surgical team, identified as [name] and the procedure verified. A "time out" was completed and the above information confirmed.  Following administration of general anesthesia, the patient was turned to a prone position on the operating room table.  After proper positioning, the previous dressings on the sacral region are removed.  The previous vacuum dressing is completely removed including 2 black sponges, pieces of black sponge, and 3 white sponges from deep within the wound.  There is a small amount of cloudy purulent appearing fluid from the left hip area.  This area is explored and drained and opened widely.  There is early granulation tissue appearing in the base of the wound where the vacuum dressing had been in place.  There is still some devitalized tissue overlying the sacrum but there is no significant volume of tissue to be debrided.  There is some early granulation tissue beginning to appear inferiorly in this area.  Wound was copiously  irrigated with warm saline which is evacuated.  Good hemostasis is noted.  White sponges are placed into the base of the wound and deployed to cover all surfaces at the base of this wound.  A large black sponge was trimmed to the appropriate dimensions.  Additional pieces of sponge were placed into the left hip region.  The adhesive covering is applied with 3 layers.  The dressing is then placed to suction with what appears to be a good seal.  Patient is turned from the operating room table back to a supine position on the hospital bed in the operating room.  Patient is awakened from anesthesia and transported to the recovery room in stable condition.  The patient tolerated the procedure well.   Darnell Level, MD [facility] Office: [phone]

## 2024-01-01 NOTE — Progress Notes (Signed)
 PHARMACY - TOTAL PARENTERAL NUTRITION CONSULT NOTE   Indication: EC Fistula  Patient Measurements: Height: 5\' 10"  (177.8 cm) Weight: 78.5 kg (173 lb 1 oz) IBW/kg (Calculated) : 73 TPN AdjBW (KG): 81.5 Body mass index is 24.83 kg/m. Usual Weight:   Assessment:  61 yoM w/ hx DM2, PE on Xarelto, Stage II rectal cancer s/p chemoradiation, low anterior & abdominoperineal resections with ileostomy and ultimately end colostomy placement earlier this year; now admitted 3/28 for sacrococcygeal ulceration with necrosis & bleeding Pharmacy consulted to manage TPN for nutrition support on 4/2 for EC fistula  Glucose / Insulin: DM2 on PO meds PTA CBGs 147 - 251 range (goal 100-150) Lantus 15 units given 4/4 at 1010 am, rSSI  26 units/24 hrs Electrolytes: all WNL, max Na in TPN Renal: BUN slightly elevated; SCr stable WNL; UOP remains adequate Hepatic: Albumin low, LFTs, Tbili WNL I/O:  wound vac 200/24 hrs UOP 1400/24 hrs - MIVF: none GI Imaging:  - 4/1 CT A/P: combination of oral contrast and air between bowel and sacrococcygeal wound suggesting combination of perforation/fistulization and necrotic infection GI Surgeries / Procedures:  3/28 EUA for bleeding from perineal wound 4/2: debridement and wound vac placement.  Central access: PICC ordered for TPN 4/2 TPN start date: 12/30/2023  Nutritional Goals: RD Assessment: Estimated Needs Total Energy Estimated Needs: 1900-2100 Total Protein Estimated Needs: 100-110g Total Fluid Estimated Needs: 2L/day  Goal TPN rate is 85 mL/hr (provides 108 g of protein and 2085 kcals per day)  Current Nutrition:  NPO, TPN  Plan:  Increase TPN to 60 mL/hr at 1800 electrolytes in TPN:  no change Na - 150 mEq/L K - 50 mEq/L Ca - 5 mEq/L Mg - 5 mEq/L Phos - 15 mmol/L Cl:Ac ratio - 1:1 Add standard MVI and trace elements to TPN Chromium on hold d/t national shortage continue SSI resistant scale and increase to 40 units insulin in TPN Further  MIVF per MD Monitor TPN labs on Mon/Thurs BMP, Mg & Phos tomorrow  Herby Abraham, Pharm.D Use secure chat for questions 01/01/2024 8:19 AM

## 2024-01-01 NOTE — Progress Notes (Signed)
 Informed Dr. Ardine Eng team patient has completed first unit of PRBC, current post blood completion temp is 99.3, no other s/s reaction and patient's room temperature is warm (setting on wall at its lowest, this nurse will inform maintenance). Patient is to have one more unit given at this time; does Dr. Gerrit Friends want this nurse to proceed. Per Dr. Gerrit Friends team, move forward with patient's second unit of blood and continue to monitor patient per protocol.

## 2024-01-01 NOTE — Transfer of Care (Signed)
 Immediate Anesthesia Transfer of Care Note  Patient: Steven [name]  Procedure(s) Performed: IRRIGATION AND DEBRIDEMENT ABSCESS  Patient Location: PACU  Anesthesia Type:General  Level of Consciousness: awake, sedated, and responds to stimulation  Airway & Oxygen Therapy: Patient Spontanous Breathing and Patient connected to face mask oxygen  Post-op Assessment: Report given to RN and Post -op Vital signs reviewed and stable  Post vital signs: Reviewed and stable  Last Vitals:  Vitals Value Taken Time  BP 145/74 01/01/24 1625  Temp    Pulse 112 01/01/24 1628  Resp 16 01/01/24 1628  SpO2 100 % 01/01/24 1628  Vitals shown include unfiled device data.  Last Pain:  Vitals:   01/01/24 1344  TempSrc:   PainSc: 0-No pain      Patients Stated Pain Goal: 0 (12/30/23 0742)  Complications: No notable events documented.

## 2024-01-01 NOTE — Progress Notes (Addendum)
 PROGRESS NOTE  Steven [name]  WUJ:[id number] DOB: 05/05/1971 DOA: 12/25/2023 PCP: Noberto Retort, MD   Brief Narrative: Patient is a 53 year old male with history of diabetes type 2, VTE on Xarelto, stage II rectal adenocarcinoma status post neoadjuvant concurrent chemoradiation in 2022 followed by low anterior resection complicated postoperative course with a chronic pelvic abscess was admitted under general surgery service after he presented with perirectal wound bleeding.  Found to have acute blood loss anemia.  We were consulted for medical management, primarily  for diabetes.  He was recently admitted by general surgery service and was discharged home on 12/17/2023 after surgical debridement of his perineal wound.  Patient has been transfused with PRBCs during this hospitalization.  General surgery is the primary team.  Underwent excisional debridement, irrigation of sacral wound and application of wound VAC on 4/2,plan for additional surgical intervention today   Assessment & Plan:  Principal Problem:   Anemia due to acute blood loss   Acute blood loss anemia/perineal wound: Secondary to bleeding from perineal wound.  Hemoglobin again dropped to the range of 6 today. Being given 2 units of blood . Total of 6 units of blood transfusion during this hospitalization.  Continue to monitor H&H.  Currently NPO.  Started on TPN  Diabetes type 2: He has been  on sliding scale only.   diabetic coordinator consulted.  Last A1c in the range of 7.5.  A1c of 10.3 about a month ago.  Monitor blood sugars  History of VTE: On Xarelto, currently on hold  Leukocytosis: Improving.  Currently on Zosyn.  Cultures have been negative so far. UA done on 4/1 showed some WBC but patient is already on Zosyn.   Nutrition Problem: Increased nutrient needs Etiology: wound healing    DVT prophylaxis:SCDs Start: 12/25/23 0912     Code Status: Full Code   Antimicrobials:  Anti-infectives (From admission,  onward)    Start     Dose/Rate Route Frequency Ordered Stop   12/25/23 1800  piperacillin-tazobactam (ZOSYN) IVPB 3.375 g       Note to Pharmacy: Pharmacy to check dosing >> approved by [facility]   3.375 g 12.5 mL/hr over 240 Minutes Intravenous Every 8 hours 12/25/23 1713         Subjective: Patient seen and examined at bedside today.  He was lying in bed, sleeping.  Not in apparent distress.  Being transfused 2 unit of PRBC.  Plan is to take him to the OR again today.  Wife and sister at bedside  Objective: Vitals:   01/01/24 0756 01/01/24 0800 01/01/24 0815 01/01/24 0900  BP: (!) 121/57 127/61  119/63  Pulse: 97 91 99 (!) 101  Resp: 15 20 (!) 26 15  Temp: 98.7 F (37.1 C)  98.8 F (37.1 C)   TempSrc: Oral  Oral   SpO2: 98% 100% 99% 95%  Weight:      Height:        Intake/Output Summary (Last 24 hours) at 01/01/2024 1030 Last data filed at 01/01/2024 0945 Gross per 24 hour  Intake 1227.03 ml  Output 2300 ml  Net -1072.97 ml   Filed Weights   12/30/23 1031 12/31/23 0300 01/01/24 0500  Weight: 81.5 kg 79.2 kg 78.5 kg    Examination:  General exam: Overall comfortable, lying in bed, not in apparent distress HEENT: PERRL Respiratory system:  no wheezes or crackles  Cardiovascular system: S1 & S2 heard, RRR.  Gastrointestinal system: Abdomen is nondistended, soft and nontender. Central nervous system:  Alert and oriented Extremities: No edema, no clubbing ,no cyanosis, PICC line Skin: Perineal wound   Data Reviewed: I have personally reviewed following labs and imaging studies  CBC: Recent Labs  Lab 12/28/23 0253 12/29/23 0323 12/29/23 0546 12/29/23 1812 12/30/23 0628 01/01/24 0525  WBC 15.8* 19.5* 34.8*  --  19.2* 11.1*  HGB 8.3* 8.0* 8.0* 9.1* 7.9* 6.5*  HCT 27.1* 25.8* 26.0* 28.6* 24.1* 20.3*  MCV 80.7 79.4* 79.5*  --  80.1 82.9  PLT 462* 516* 733*  --  490* 462*   Basic Metabolic Panel: Recent Labs  Lab 12/26/23 0025 12/30/23 0623 12/31/23 0617  01/01/24 0525  NA 137 130* 131* 135  K 4.1 4.8 4.2 3.9  CL 109 96* 98 101  CO2 22 24 23 26   GLUCOSE 161* 251* 257* 145*  BUN 21* 27* 29* 25*  CREATININE 0.88 0.85 0.85 0.70  CALCIUM 7.6* 7.9* 7.7* 7.7*  MG  --  2.2 2.1  --   PHOS  --  3.6 3.1  --      Recent Results (from the past 240 hours)  MRSA Next Gen by PCR, Nasal     Status: None   Collection Time: 12/25/23  4:38 PM   Specimen: Nasal Mucosa; Nasal Swab  Result Value Ref Range Status   MRSA by PCR Next Gen NOT DETECTED NOT DETECTED Final    Comment: (NOTE) The GeneXpert MRSA Assay (FDA approved for NASAL specimens only), is one component of a comprehensive MRSA colonization surveillance program. It is not intended to diagnose MRSA infection nor to guide or monitor treatment for MRSA infections. Test performance is not FDA approved in patients less than 23 years old. Performed at [facility], [address] [place], [place] [address]      Radiology Studies: No results found.   Scheduled Meds:  sodium chloride   Intravenous Once   carbamide peroxide  5 drop Both EARS BID   Chlorhexidine Gluconate Cloth  6 each Topical Q2200   insulin aspart  0-20 Units Subcutaneous Q4H   magic mouthwash w/lidocaine  10 mL Oral TID   sodium chloride flush  10-40 mL Intracatheter Q12H   thiamine (VITAMIN B1) injection  100 mg Intravenous Q24H   Continuous Infusions:  piperacillin-tazobactam (ZOSYN)  IV 3.375 g (01/01/24 1011)   TPN ADULT (ION) 40 mL/hr at 01/01/24 0945   TPN ADULT (ION)       LOS: 7 days   Burnadette Pop, MD [facility] P4/12/2023, 10:30 AM

## 2024-01-02 DIAGNOSIS — D62 Acute posthemorrhagic anemia: Secondary | ICD-10-CM | POA: Diagnosis not present

## 2024-01-02 LAB — CBC WITH DIFFERENTIAL/PLATELET
Abs Immature Granulocytes: 0.1 10*3/uL — ABNORMAL HIGH (ref 0.00–0.07)
Basophils Absolute: 0 10*3/uL (ref 0.0–0.1)
Basophils Relative: 0 %
Eosinophils Absolute: 0 10*3/uL (ref 0.0–0.5)
Eosinophils Relative: 0 %
HCT: 24.2 % — ABNORMAL LOW (ref 39.0–52.0)
Hemoglobin: 7.8 g/dL — ABNORMAL LOW (ref 13.0–17.0)
Immature Granulocytes: 1 %
Lymphocytes Relative: 12 %
Lymphs Abs: 1.3 10*3/uL (ref 0.7–4.0)
MCH: 27.2 pg (ref 26.0–34.0)
MCHC: 32.2 g/dL (ref 30.0–36.0)
MCV: 84.3 fL (ref 80.0–100.0)
Monocytes Absolute: 0.6 10*3/uL (ref 0.1–1.0)
Monocytes Relative: 5 %
Neutro Abs: 8.8 10*3/uL — ABNORMAL HIGH (ref 1.7–7.7)
Neutrophils Relative %: 82 %
Platelets: 444 10*3/uL — ABNORMAL HIGH (ref 150–400)
RBC: 2.87 MIL/uL — ABNORMAL LOW (ref 4.22–5.81)
RDW: 20.2 % — ABNORMAL HIGH (ref 11.5–15.5)
WBC: 10.8 10*3/uL — ABNORMAL HIGH (ref 4.0–10.5)
nRBC: 0 % (ref 0.0–0.2)

## 2024-01-02 LAB — BASIC METABOLIC PANEL WITH GFR
Anion gap: 5 (ref 5–15)
BUN: 20 mg/dL (ref 6–20)
CO2: 26 mmol/L (ref 22–32)
Calcium: 7.8 mg/dL — ABNORMAL LOW (ref 8.9–10.3)
Chloride: 103 mmol/L (ref 98–111)
Creatinine, Ser: 0.56 mg/dL — ABNORMAL LOW (ref 0.61–1.24)
GFR, Estimated: >60 mL/min (ref >60)
Glucose, Bld: 207 mg/dL — ABNORMAL HIGH (ref 70–99)
Potassium: 4.1 mmol/L (ref 3.5–5.1)
Sodium: 134 mmol/L — ABNORMAL LOW (ref 135–145)

## 2024-01-02 LAB — BPAM RBC
Blood Product Expiration Date: 202504272359
Blood Product Expiration Date: 202504272359
Blood Product Expiration Date: 202504302359
Blood Product Expiration Date: 202504302359
ISSUE DATE / TIME: 202504010757
ISSUE DATE / TIME: 202504011444
ISSUE DATE / TIME: 202504040741
ISSUE DATE / TIME: 202504041020
Unit Type and Rh: 6200
Unit Type and Rh: 6200
Unit Type and Rh: 6200
Unit Type and Rh: 6200

## 2024-01-02 LAB — GLUCOSE, CAPILLARY
Glucose-Capillary: 138 mg/dL — ABNORMAL HIGH (ref 70–99)
Glucose-Capillary: 152 mg/dL — ABNORMAL HIGH (ref 70–99)
Glucose-Capillary: 194 mg/dL — ABNORMAL HIGH (ref 70–99)
Glucose-Capillary: 213 mg/dL — ABNORMAL HIGH (ref 70–99)
Glucose-Capillary: 223 mg/dL — ABNORMAL HIGH (ref 70–99)
Glucose-Capillary: 240 mg/dL — ABNORMAL HIGH (ref 70–99)

## 2024-01-02 LAB — TYPE AND SCREEN
ABO/RH(D): A POS
Antibody Screen: NEGATIVE
Unit division: 0
Unit division: 0
Unit division: 0
Unit division: 0

## 2024-01-02 LAB — MAGNESIUM: Magnesium: 1.9 mg/dL (ref 1.7–2.4)

## 2024-01-02 LAB — PHOSPHORUS: Phosphorus: 2.8 mg/dL (ref 2.5–4.6)

## 2024-01-02 MED ORDER — FAT EMUL FISH OIL/PLANT BASED 20% (SMOFLIPID)IV EMUL
INTRAVENOUS | Status: AC
  Filled 2024-01-02: qty 763.2

## 2024-01-02 NOTE — Plan of Care (Signed)
 Patient did well today.  Vitals remained stable.  Plan to do an addition debridement Monday.  Remained on clears today.

## 2024-01-02 NOTE — Progress Notes (Signed)
 PROGRESS NOTE  Steven [name]  RUE:[id number] DOB: December 21, 1970 DOA: 12/25/2023 PCP: Noberto Retort, MD   Brief Narrative: Patient is a 53 year old male with history of diabetes type 2, VTE on Xarelto, stage II rectal adenocarcinoma status post neoadjuvant concurrent chemoradiation in 2022 followed by low anterior resection complicated postoperative course with a chronic pelvic abscess, was admitted under general surgery service after he presented with perirectal wound bleeding.  Found to have acute blood loss anemia.  We were consulted for medical management, primarily  for diabetes.  He was recently admitted by general surgery service and was discharged home on 12/17/2023 after surgical debridement of his perineal wound.  Patient has been transfused with PRBCs during this hospitalization.  General surgery is the primary team.  Underwent excisional debridement, irrigation of sacral wound and application of wound VAC on 4/2.  Assessment & Plan:  Principal Problem:   Anemia due to acute blood loss   Acute blood loss anemia/perineal wound: Secondary to bleeding from perineal wound.   Total of 6 units of blood transfusion during this hospitalization.  Continue to monitor H&H.   Stage II rectal adenocarcinoma:Status post neoadjuvant concurrent chemoradiation in 2022 followed by low anterior resection complicated postoperative course with a chronic pelvic abscess, was admitted under general surgery service after he presented with perirectal wound bleeding Currently NPO.  Started on TPN.Started on sips  Diabetes type 2: He has been  on sliding scale only.   diabetic coordinator consulted.  Last A1c in the range of 7.5.  A1c of 10.3 about a month ago.  Monitor blood sugars  History of VTE: On Xarelto, currently on hold  Leukocytosis: Improving.  Currently on Zosyn.  Cultures have been negative so far. UA done on 4/1 showed some WBC but patient is already on Zosyn.   Nutrition Problem: Increased  nutrient needs Etiology: wound healing    DVT prophylaxis:SCDs Start: 12/25/23 0912     Code Status: Full Code   Antimicrobials:  Anti-infectives (From admission, onward)    Start     Dose/Rate Route Frequency Ordered Stop   12/25/23 1800  piperacillin-tazobactam (ZOSYN) IVPB 3.375 g       Note to Pharmacy: Pharmacy to check dosing >> approved by [facility]   3.375 g 12.5 mL/hr over 240 Minutes Intravenous Every 8 hours 12/25/23 1713         Subjective: Patient seen and examined at bedside today.  He is hemodynamically stable.  Lying on bed.  Appears very comfortable today.  Denies any significant pain.  Hemoglobin in the range of 7 today.  Sugars still in the range of 200  Objective: Vitals:   01/02/24 0400 01/02/24 0500 01/02/24 0600 01/02/24 0835  BP: (!) 104/51 (!) 108/54 (!) 92/52   Pulse: 80 77 73   Resp: 20 15 14    Temp: 97.9 F (36.6 C)   97.8 F (36.6 C)  TempSrc: Oral   Oral  SpO2: 95% 95% 95%   Weight:      Height:        Intake/Output Summary (Last 24 hours) at 01/02/2024 1015 Last data filed at 01/02/2024 0731 Gross per 24 hour  Intake 1650.8 ml  Output 2070 ml  Net -419.2 ml   Filed Weights   12/30/23 1031 12/31/23 0300 01/01/24 0500  Weight: 81.5 kg 79.2 kg 78.5 kg    Examination:  General exam: Overall comfortable, not in distress HEENT: PERRL Respiratory system:  no wheezes or crackles  Cardiovascular system: S1 & S2 heard,  RRR.  Gastrointestinal system: Abdomen is nondistended, soft and nontender.  ostomy Central nervous system: Alert and oriented Extremities: No edema, no clubbing ,no cyanosis,picc line Skin: Perineal wound   Data Reviewed: I have personally reviewed following labs and imaging studies  CBC: Recent Labs  Lab 12/29/23 0323 12/29/23 0546 12/29/23 1812 12/30/23 0628 01/01/24 0525 01/01/24 1332 01/01/24 1423 01/01/24 1645 01/02/24 0517  WBC 19.5* 34.8*  --  19.2* 11.1*  --   --   --  10.8*  NEUTROABS  --   --   --   --    --   --   --   --  8.8*  HGB 8.0* 8.0*   < > 7.9* 6.5* 7.5* 8.2* 9.1* 7.8*  HCT 25.8* 26.0*   < > 24.1* 20.3* 22.0* 24.0* 27.8* 24.2*  MCV 79.4* 79.5*  --  80.1 82.9  --   --   --  84.3  PLT 516* 733*  --  490* 462*  --   --   --  444*   < > = values in this interval not displayed.   Basic Metabolic Panel: Recent Labs  Lab 12/30/23 0623 12/31/23 0617 01/01/24 0525 01/01/24 1332 01/01/24 1423 01/02/24 0514  NA 130* 131* 135 134* 135 134*  K 4.8 4.2 3.9 6.2* 4.3 4.1  CL 96* 98 101 101 101 103  CO2 24 23 26   --   --  26  GLUCOSE 251* 257* 145* 667* 193* 207*  BUN 27* 29* 25* 20 19 20   CREATININE 0.85 0.85 0.70 0.80 0.70 0.56*  CALCIUM 7.9* 7.7* 7.7*  --   --  7.8*  MG 2.2 2.1  --   --   --  1.9  PHOS 3.6 3.1  --   --   --  2.8     Recent Results (from the past 240 hours)  MRSA Next Gen by PCR, Nasal     Status: None   Collection Time: 12/25/23  4:38 PM   Specimen: Nasal Mucosa; Nasal Swab  Result Value Ref Range Status   MRSA by PCR Next Gen NOT DETECTED NOT DETECTED Final    Comment: (NOTE) The GeneXpert MRSA Assay (FDA approved for NASAL specimens only), is one component of a comprehensive MRSA colonization surveillance program. It is not intended to diagnose MRSA infection nor to guide or monitor treatment for MRSA infections. Test performance is not FDA approved in patients less than 41 years old. Performed at [facility], [address] [place], [place] [address]      Radiology Studies: No results found.   Scheduled Meds:  carbamide peroxide  5 drop Both EARS BID   Chlorhexidine Gluconate Cloth  6 each Topical Q2200   insulin aspart  0-20 Units Subcutaneous Q4H   magic mouthwash w/lidocaine  10 mL Oral TID   sodium chloride flush  10-40 mL Intracatheter Q12H   thiamine (VITAMIN B1) injection  100 mg Intravenous Q24H   Continuous Infusions:  piperacillin-tazobactam (ZOSYN)  IV Stopped (01/02/24 0653)   TPN ADULT (ION) 60 mL/hr at  01/02/24 0600   TPN ADULT (ION)       LOS: 8 days   Burnadette Pop, MD [facility] P4/01/2024, 10:15 AM

## 2024-01-02 NOTE — Progress Notes (Signed)
   01/02/24 1400  Spiritual Encounters  Type of Visit Initial  Care provided to: Pt and family  Conversation partners present during encounter Nurse;Physician  Referral source Chaplain team  Reason for visit Urgent spiritual support  OnCall Visit Yes  Spiritual Framework  Presenting Themes Meaning/purpose/sources of inspiration;Coping tools;Significant life change;Values and beliefs;Rituals and practive;Courage hope and growth;Impactful experiences and emotions;Community and relationships  Patient Stress Factors Health changes;Major life changes;Loss of control  Family Stress Factors Family relationships;Major life changes  Interventions  Spiritual Care Interventions Made Established relationship of care and support;Compassionate presence;Reflective listening;Normalization of emotions;Reconciliation with self/others;Meaning making;Prayer;Encouragement;Supported grief process  Intervention Outcomes  Outcomes Connection to spiritual care;Connection to values and goals of care;Awareness of support;Autonomy/agency;Patient family open to resources   Met with pt and family at bedside 3 daughters and wife at side.  Provided spiritual care as patient was alert and shared life and faith narrative.  Stated that hi slocal [employer] Renato Gails has been to see him s well.  Spoke of his fatiha nd his emotions regarding coping skills at this time. Provided opportunity for prayer and emotional support.

## 2024-01-02 NOTE — Progress Notes (Signed)
 PHARMACY - TOTAL PARENTERAL NUTRITION CONSULT NOTE   Indication: EC Fistula  Patient Measurements: Height: 5\' 10"  (177.8 cm) Weight: 78.5 kg (173 lb 1 oz) IBW/kg (Calculated) : 73 TPN AdjBW (KG): 81.5 Body mass index is 24.83 kg/m. Usual Weight:   Assessment:  1 yoM w/ hx DM2, PE on Xarelto, Stage II rectal cancer s/p chemoradiation, low anterior & abdominoperineal resections with ileostomy and ultimately end colostomy placement earlier this year; now admitted 3/28 for sacrococcygeal ulceration with necrosis & bleeding Pharmacy consulted to manage TPN for nutrition support on 4/2 for EC fistula  Glucose / Insulin: DM2 on PO meds PTA CBGs 148 - 267 range (goal 100-150) Decadron 4 mg IV x 1 4/4 at 1600 in OR- all CBGs > 200 after decadron given, rSSI  32 units/24 hrs 40 units insulin in TPN at 60 ml/hr Electrolytes: WNL, Na 134 with max Na in TPN Renal: BUN WNL; SCr stable WNL; UOP remains adequate Hepatic: Albumin low, LFTs, Tbili WNL I/O:  wound vac 100/24 hrs Stool 40/24 hrs UOP 2400/24 hrs - MIVF: none GI Imaging:  - 4/1 CT A/P: combination of oral contrast and air between bowel and sacrococcygeal wound suggesting combination of perforation/fistulization and necrotic infection GI Surgeries / Procedures:  3/28 EUA for bleeding from perineal wound 4/2: debridement and wound vac placement 4/4  wound exploration, VAC dressing change  Central access: PICC ordered for TPN 4/2 TPN start date: 12/30/2023  Nutritional Goals: RD Assessment: Estimated Needs Total Energy Estimated Needs: 1900-2100 Total Protein Estimated Needs: 100-110g Total Fluid Estimated Needs: 2L/day  Goal TPN rate is 85 mL/hr (provides 108 g of protein and 2085 kcals per day)  Current Nutrition:  TPN CLD 4/4  Plan:  continue TPN @ 60 mL/hr; will not increase rate until CBGs controlled electrolytes in TPN:  no change Na - 150 mEq/L K - 50 mEq/L Ca - 5 mEq/L Mg - 5 mEq/L Phos - 15 mmol/L Cl:Ac ratio  - 1:1 Add standard MVI and trace elements to TPN Chromium on hold d/t national shortage continue SSI resistant scale and increase to 60 units insulin in TPN Further MIVF per MD Monitor TPN labs on Mon/Thurs BMP tomorrow  Herby Abraham, Pharm.D Use secure chat for questions 01/02/2024 7:45 AM

## 2024-01-02 NOTE — Progress Notes (Signed)
 1 Day Post-Op   Subjective/Chief Complaint: Pain tolerable. States that he feels improved.     Objective: Vital signs in last 24 hours: Temp:  [97.9 F (36.6 C)-99.7 F (37.6 C)] 97.9 F (36.6 C) (04/05 0400) Pulse Rate:  [73-114] 73 (04/05 0600) Resp:  [13-30] 14 (04/05 0600) BP: (92-145)/(51-78) 92/52 (04/05 0600) SpO2:  [92 %-100 %] 95 % (04/05 0600) Last BM Date : 01/01/24  Intake/Output from previous day: 04/04 0701 - 04/05 0700 In: 2222.9 [I.V.:1513.5; Blood:566; IV Piggyback:143.4] Out: 1840 [Urine:1700; Drains:100; Stool:40] Intake/Output this shift: Total I/O In: -  Out: 250 [Drains:250]  General appearance: alert, cooperative, and no distress Resp: breathing comfortably Cardio: regular rate and rhythm GI: soft, non distended, ostomy with small amount stool/flatus  Lab Results:  Recent Labs    01/01/24 0525 01/01/24 1332 01/01/24 1645 01/02/24 0517  WBC 11.1*  --   --  10.8*  HGB 6.5*   < > 9.1* 7.8*  HCT 20.3*   < > 27.8* 24.2*  PLT 462*  --   --  444*   < > = values in this interval not displayed.   BMET Recent Labs    01/01/24 0525 01/01/24 1332 01/01/24 1423 01/02/24 0514  NA 135   < > 135 134*  K 3.9   < > 4.3 4.1  CL 101   < > 101 103  CO2 26  --   --  26  GLUCOSE 145*   < > 193* 207*  BUN 25*   < > 19 20  CREATININE 0.70   < > 0.70 0.56*  CALCIUM 7.7*  --   --  7.8*   < > = values in this interval not displayed.   PT/INR No results for input(s): "LABPROT", "INR" in the last 72 hours. ABG No results for input(s): "PHART", "HCO3" in the last 72 hours.  Invalid input(s): "PCO2", "PO2"  Studies/Results: No results found.  Anti-infectives: Anti-infectives (From admission, onward)    Start     Dose/Rate Route Frequency Ordered Stop   12/25/23 1800  piperacillin-tazobactam (ZOSYN) IVPB 3.375 g       Note to Pharmacy: Pharmacy to check dosing >> approved by [facility]   3.375 g 12.5 mL/hr over 240 Minutes Intravenous Every 8 hours  12/25/23 1713         Assessment/Plan: s/p Procedure(s): IRRIGATION AND DEBRIDEMENT ABSCESS (N/A) HD#8, POD - open infected perineal wound with enteric fistula Hx of LAR 2022, redo LAR with colostomy 2024, s/p completion APR 11/2023 with ongoing wound necrosis OR dressing changes Control of bleeding from wound yesterday with suture ligation this week, yesterday's dressing change better.  IV Zosyn NPO, sips of clears for fistula. Ok for coffee with cream, otherwise just sips of clears.  TNA, PICC line  Plan repeat dressing change in OR Monday.  DM with hyperglycemia - on insulin ABL anemia with anemia of chronic disease- stable.    LOS: 8 days    Almond Lint 01/02/2024

## 2024-01-03 DIAGNOSIS — D62 Acute posthemorrhagic anemia: Secondary | ICD-10-CM | POA: Diagnosis not present

## 2024-01-03 LAB — BASIC METABOLIC PANEL WITH GFR
Anion gap: 8 (ref 5–15)
BUN: 20 mg/dL (ref 6–20)
CO2: 25 mmol/L (ref 22–32)
Calcium: 7.8 mg/dL — ABNORMAL LOW (ref 8.9–10.3)
Chloride: 101 mmol/L (ref 98–111)
Creatinine, Ser: 0.36 mg/dL — ABNORMAL LOW (ref 0.61–1.24)
GFR, Estimated: >60 mL/min (ref >60)
Glucose, Bld: 116 mg/dL — ABNORMAL HIGH (ref 70–99)
Potassium: 4.3 mmol/L (ref 3.5–5.1)
Sodium: 134 mmol/L — ABNORMAL LOW (ref 135–145)

## 2024-01-03 LAB — GLUCOSE, CAPILLARY
Glucose-Capillary: 107 mg/dL — ABNORMAL HIGH (ref 70–99)
Glucose-Capillary: 125 mg/dL — ABNORMAL HIGH (ref 70–99)
Glucose-Capillary: 137 mg/dL — ABNORMAL HIGH (ref 70–99)
Glucose-Capillary: 150 mg/dL — ABNORMAL HIGH (ref 70–99)
Glucose-Capillary: 162 mg/dL — ABNORMAL HIGH (ref 70–99)
Glucose-Capillary: 178 mg/dL — ABNORMAL HIGH (ref 70–99)

## 2024-01-03 LAB — HEMOGLOBIN AND HEMATOCRIT, BLOOD
HCT: 27 % — ABNORMAL LOW (ref 39.0–52.0)
Hemoglobin: 8.6 g/dL — ABNORMAL LOW (ref 13.0–17.0)

## 2024-01-03 MED ORDER — BOOST / RESOURCE BREEZE PO LIQD CUSTOM: 1.0000 | Freq: Three times a day (TID) | ORAL | Status: DC

## 2024-01-03 MED ORDER — TRAVASOL 10 % IV SOLN
INTRAVENOUS | Status: AC
  Filled 2024-01-03: qty 1081.2

## 2024-01-03 NOTE — Progress Notes (Signed)
 The patient has been encouraged to turn every two hours to assist with mobility and to preserve skin integrity. Patient is not agreeable due to increased pain.

## 2024-01-03 NOTE — Progress Notes (Signed)
 PHARMACY - TOTAL PARENTERAL NUTRITION CONSULT NOTE   Indication: EC Fistula  Patient Measurements: Height: 5\' 10"  (177.8 cm) Weight: 78.5 kg (173 lb 1 oz) IBW/kg (Calculated) : 73 TPN AdjBW (KG): 81.5 Body mass index is 24.83 kg/m. Usual Weight:   Assessment:  36 yoM w/ hx DM2, PE on Xarelto, Stage II rectal cancer s/p chemoradiation, low anterior & abdominoperineal resections with ileostomy and ultimately end colostomy placement earlier this year; now admitted 3/28 for sacrococcygeal ulceration with necrosis & bleeding Pharmacy consulted to manage TPN for nutrition support on 4/2 for EC fistula  Glucose / Insulin: DM2 on PO meds PTA CBGs after TPN hung with 60 units insulin 125 - 152 (goal 100-150) Decadron 4 mg IV x 1 4/4 at 1600 in OR, rSSI 28 units/24 hrs 60 units insulin in TPN at 60 ml/hr Electrolytes: WNL, Na 134 with max Na in TPN Renal: BUN WNL; SCr stable WNL; UOP remains adequate Hepatic: Albumin low, LFTs, Tbili WNL I/O:  wound vac 925/24 hrs PO intake: 480 mls clears Stool 45/24 hrs UOP 1950/24 hrs - MIVF: none GI Imaging:  - 4/1 CT A/P: combination of oral contrast and air between bowel and sacrococcygeal wound suggesting combination of perforation/fistulization and necrotic infection GI Surgeries / Procedures:  3/28 EUA for bleeding from perineal wound 4/2: debridement and wound vac placement 4/4  wound exploration, VAC dressing change  Central access: PICC ordered for TPN 4/2 TPN start date: 12/30/2023  Nutritional Goals: RD Assessment: Estimated Needs Total Energy Estimated Needs: 1900-2100 Total Protein Estimated Needs: 100-110g Total Fluid Estimated Needs: 2L/day  Goal TPN rate is 85 mL/hr (provides 108 g of protein and 2085 kcals per day)  Current Nutrition:  TPN 4/4 sips of clears, OK for coffee w/ cream  Plan:  increase TPN to goal rate 85 mL/hr;  electrolytes in TPN:  no change Na - 150 mEq/L K - 50 mEq/L Ca - 5 mEq/L Mg - 5 mEq/L Phos -  15 mmol/L Cl:Ac ratio - 1:1 Add standard MVI and trace elements to TPN Chromium on hold d/t national shortage continue SSI resistant scale and increase to 80 units insulin in TPN (to account for rate increase) Further MIVF per MD Monitor TPN labs on Mon/Thurs  Herby Abraham, Pharm.D Use secure chat for questions 01/03/2024 9:05 AM

## 2024-01-03 NOTE — Progress Notes (Signed)
 PROGRESS NOTE  Steven [name]  NWG:[id number] DOB: 1970/11/22 DOA: 12/25/2023 PCP: Noberto Retort, MD   Brief Narrative: Patient is a 53 year old male with history of diabetes type 2, VTE on Xarelto, stage II rectal adenocarcinoma status post neoadjuvant concurrent chemoradiation in 2022 followed by low anterior resection complicated postoperative course with a chronic pelvic abscess, was admitted under general surgery service after he presented with perirectal wound bleeding.  Found to have acute blood loss anemia.  We were consulted for medical management, primarily  for diabetes.  He was recently admitted by general surgery service and was discharged home on 12/17/2023 after surgical debridement of his perineal wound.  Patient has been transfused with PRBCs during this hospitalization.  General surgery is the primary team.  Underwent excisional debridement, irrigation of sacral wound and application of wound VAC on 4/2.Plan for repeat debridement tomorrow  Assessment & Plan:  Principal Problem:   Anemia due to acute blood loss   Acute blood loss anemia/perineal wound: Secondary to bleeding from perineal wound.   Total of 6 units of blood transfusion during this hospitalization.  Continue to monitor H&H.  Hemoglobin of 8.6 today  Stage II rectal adenocarcinoma:Status post neoadjuvant concurrent chemoradiation in 2022 followed by low anterior resection complicated postoperative course with a chronic pelvic abscess, was admitted under general surgery service after he presented with perirectal wound bleeding Currently NPO.  Started on TPN.Started on sips  Diabetes type 2: He has been  on sliding scale only.   diabetic coordinator consulted.  Last A1c in the range of 7.5.  A1c of 10.3 about a month ago.  Monitor blood sugars  History of VTE: On Xarelto, currently on hold  Leukocytosis: Improving.  Currently on Zosyn.  Cultures have been negative so far. UA done on 4/1 showed some WBC but  patient is already on Zosyn.   Nutrition Problem: Increased nutrient needs Etiology: wound healing    DVT prophylaxis:SCDs Start: 12/25/23 0912     Code Status: Full Code   Antimicrobials:  Anti-infectives (From admission, onward)    Start     Dose/Rate Route Frequency Ordered Stop   12/25/23 1800  piperacillin-tazobactam (ZOSYN) IVPB 3.375 g       Note to Pharmacy: Pharmacy to check dosing >> approved by [facility]   3.375 g 12.5 mL/hr over 240 Minutes Intravenous Every 8 hours 12/25/23 1713         Subjective: Patient seen and examined at bedside today.  Hemodynamically stable.  Comfortable.  Lying in bed.  No active issues.  Plan for re-debridement tomorrow  Objective: Vitals:   01/03/24 0700 01/03/24 0800 01/03/24 0847 01/03/24 0900  BP: 125/72 107/63  111/61  Pulse: 99 94  92  Resp: 17 13  13   Temp:   98.6 F (37 C)   TempSrc:   Oral   SpO2: 96% 95%  95%  Weight:      Height:        Intake/Output Summary (Last 24 hours) at 01/03/2024 1046 Last data filed at 01/03/2024 0650 Gross per 24 hour  Intake 1980.99 ml  Output 2450 ml  Net -469.01 ml   Filed Weights   12/30/23 1031 12/31/23 0300 01/01/24 0500  Weight: 81.5 kg 79.2 kg 78.5 kg    Examination:   General exam: Overall comfortable, not in distress HEENT: PERRL Respiratory system:  no wheezes or crackles  Cardiovascular system: S1 & S2 heard, RRR.  Gastrointestinal system: Abdomen is nondistended, soft and nontender. Central nervous system: Alert  and oriented Extremities: No edema, no clubbing ,no cyanosis,picc line Skin: perineal wound     Data Reviewed: I have personally reviewed following labs and imaging studies  CBC: Recent Labs  Lab 12/29/23 0323 12/29/23 0546 12/29/23 1812 12/30/23 0628 01/01/24 0525 01/01/24 1332 01/01/24 1423 01/01/24 1645 01/02/24 0517 01/03/24 0849  WBC 19.5* 34.8*  --  19.2* 11.1*  --   --   --  10.8*  --   NEUTROABS  --   --   --   --   --   --   --   --  8.8*   --   HGB 8.0* 8.0*   < > 7.9* 6.5* 7.5* 8.2* 9.1* 7.8* 8.6*  HCT 25.8* 26.0*   < > 24.1* 20.3* 22.0* 24.0* 27.8* 24.2* 27.0*  MCV 79.4* 79.5*  --  80.1 82.9  --   --   --  84.3  --   PLT 516* 733*  --  490* 462*  --   --   --  444*  --    < > = values in this interval not displayed.   Basic Metabolic Panel: Recent Labs  Lab 12/30/23 0623 12/31/23 0617 01/01/24 0525 01/01/24 1332 01/01/24 1423 01/02/24 0514 01/03/24 0525  NA 130* 131* 135 134* 135 134* 134*  K 4.8 4.2 3.9 6.2* 4.3 4.1 4.3  CL 96* 98 101 101 101 103 101  CO2 24 23 26   --   --  26 25  GLUCOSE 251* 257* 145* 667* 193* 207* 116*  BUN 27* 29* 25* 20 19 20 20   CREATININE 0.85 0.85 0.70 0.80 0.70 0.56* 0.36*  CALCIUM 7.9* 7.7* 7.7*  --   --  7.8* 7.8*  MG 2.2 2.1  --   --   --  1.9  --   PHOS 3.6 3.1  --   --   --  2.8  --      Recent Results (from the past 240 hours)  MRSA Next Gen by PCR, Nasal     Status: None   Collection Time: 12/25/23  4:38 PM   Specimen: Nasal Mucosa; Nasal Swab  Result Value Ref Range Status   MRSA by PCR Next Gen NOT DETECTED NOT DETECTED Final    Comment: (NOTE) The GeneXpert MRSA Assay (FDA approved for NASAL specimens only), is one component of a comprehensive MRSA colonization surveillance program. It is not intended to diagnose MRSA infection nor to guide or monitor treatment for MRSA infections. Test performance is not FDA approved in patients less than 58 years old. Performed at [facility], [address] [place], [place] [address]      Radiology Studies: No results found.   Scheduled Meds:  carbamide peroxide  5 drop Both EARS BID   Chlorhexidine Gluconate Cloth  6 each Topical Q2200   insulin aspart  0-20 Units Subcutaneous Q4H   magic mouthwash w/lidocaine  10 mL Oral TID   sodium chloride flush  10-40 mL Intracatheter Q12H   thiamine (VITAMIN B1) injection  100 mg Intravenous Q24H   Continuous Infusions:  piperacillin-tazobactam (ZOSYN)   IV Stopped (01/03/24 0734)   TPN ADULT (ION) 60 mL/hr at 01/03/24 0500   TPN ADULT (ION)       LOS: 9 days   Burnadette Pop, MD [facility] P4/02/2024, 10:46 AM

## 2024-01-03 NOTE — Progress Notes (Signed)
 2 Days Post-Op   Subjective/Chief Complaint: No complaints. No acute events.     Objective: Vital signs in last 24 hours: Temp:  [97.9 F (36.6 C)-98.9 F (37.2 C)] 98.6 F (37 C) (04/06 0847) Pulse Rate:  [77-104] 92 (04/06 0900) Resp:  [13-26] 13 (04/06 0900) BP: (106-147)/(50-72) 111/61 (04/06 0900) SpO2:  [88 %-98 %] 95 % (04/06 0900) Last BM Date : 01/01/24  Intake/Output from previous day: 04/05 0701 - 04/06 0700 In: 1981 [P.O.:480; I.V.:1376.1; IV Piggyback:124.9] Out: 2920 [Urine:1950; Drains:925; Stool:45] Intake/Output this shift: No intake/output data recorded.  General appearance: alert, cooperative, and no distress Resp: breathing comfortably Cardio: regular rate and rhythm GI: soft, non distended, ostomy with small amount stool/flatus  Lab Results:  Recent Labs    01/01/24 0525 01/01/24 1332 01/02/24 0517 01/03/24 0849  WBC 11.1*  --  10.8*  --   HGB 6.5*   < > 7.8* 8.6*  HCT 20.3*   < > 24.2* 27.0*  PLT 462*  --  444*  --    < > = values in this interval not displayed.   BMET Recent Labs    01/02/24 0514 01/03/24 0525  NA 134* 134*  K 4.1 4.3  CL 103 101  CO2 26 25  GLUCOSE 207* 116*  BUN 20 20  CREATININE 0.56* 0.36*  CALCIUM 7.8* 7.8*   PT/INR No results for input(s): "LABPROT", "INR" in the last 72 hours. ABG No results for input(s): "PHART", "HCO3" in the last 72 hours.  Invalid input(s): "PCO2", "PO2"  Studies/Results: No results found.  Anti-infectives: Anti-infectives (From admission, onward)    Start     Dose/Rate Route Frequency Ordered Stop   12/25/23 1800  piperacillin-tazobactam (ZOSYN) IVPB 3.375 g       Note to Pharmacy: Pharmacy to check dosing >> approved by [facility]   3.375 g 12.5 mL/hr over 240 Minutes Intravenous Every 8 hours 12/25/23 1713         Assessment/Plan: s/p Procedure(s): IRRIGATION AND DEBRIDEMENT ABSCESS (N/A) HD#9, POD - open infected perineal wound with enteric fistula Hx of LAR 2022, redo  LAR with colostomy 2024, s/p completion APR 11/2023 with ongoing wound necrosis OR dressing changes Control of bleeding from wound last week.  IV Zosyn Clear liquids only. Ok for coffee with cream, otherwise just sips of clears.  TNA, PICC line  Plan repeat dressing change in OR Monday. Npo after midnight, obtain consent for Dr Derrell Lolling  DM with hyperglycemia - on insulin ABL anemia with anemia of chronic disease- stable.    LOS: 9 days    Steven [name] 01/03/2024

## 2024-01-04 ENCOUNTER — Encounter (HOSPITAL_COMMUNITY): Admission: EM | Disposition: A | Discharge status: Skilled Nursing Facility | Payer: Self-pay | Source: Home / Self Care

## 2024-01-04 ENCOUNTER — Other Ambulatory Visit: Payer: Self-pay

## 2024-01-04 ENCOUNTER — Inpatient Hospital Stay (HOSPITAL_COMMUNITY)

## 2024-01-04 ENCOUNTER — Encounter (HOSPITAL_COMMUNITY): Payer: Self-pay | Admitting: Surgery

## 2024-01-04 DIAGNOSIS — D62 Acute posthemorrhagic anemia: Secondary | ICD-10-CM | POA: Diagnosis not present

## 2024-01-04 HISTORY — PX: INCISION AND DRAINAGE OF WOUND: SHX1803

## 2024-01-04 LAB — COMPREHENSIVE METABOLIC PANEL WITH GFR
ALT: 9 U/L (ref 0–44)
AST: 11 U/L — ABNORMAL LOW (ref 15–41)
Albumin: <1.5 g/dL — ABNORMAL LOW (ref 3.5–5.0)
Alkaline Phosphatase: 113 U/L (ref 38–126)
Anion gap: 8 (ref 5–15)
BUN: 20 mg/dL (ref 6–20)
CO2: 23 mmol/L (ref 22–32)
Calcium: 7.7 mg/dL — ABNORMAL LOW (ref 8.9–10.3)
Chloride: 100 mmol/L (ref 98–111)
Creatinine, Ser: 0.54 mg/dL — ABNORMAL LOW (ref 0.61–1.24)
GFR, Estimated: >60 mL/min (ref >60)
Glucose, Bld: 151 mg/dL — ABNORMAL HIGH (ref 70–99)
Potassium: 4.3 mmol/L (ref 3.5–5.1)
Sodium: 131 mmol/L — ABNORMAL LOW (ref 135–145)
Total Bilirubin: 0.6 mg/dL (ref 0.0–1.2)
Total Protein: 5.9 g/dL — ABNORMAL LOW (ref 6.5–8.1)

## 2024-01-04 LAB — PHOSPHORUS: Phosphorus: 3 mg/dL (ref 2.5–4.6)

## 2024-01-04 LAB — GLUCOSE, CAPILLARY
Glucose-Capillary: 128 mg/dL — ABNORMAL HIGH (ref 70–99)
Glucose-Capillary: 130 mg/dL — ABNORMAL HIGH (ref 70–99)
Glucose-Capillary: 148 mg/dL — ABNORMAL HIGH (ref 70–99)
Glucose-Capillary: 149 mg/dL — ABNORMAL HIGH (ref 70–99)
Glucose-Capillary: 164 mg/dL — ABNORMAL HIGH (ref 70–99)
Glucose-Capillary: 172 mg/dL — ABNORMAL HIGH (ref 70–99)
Glucose-Capillary: 173 mg/dL — ABNORMAL HIGH (ref 70–99)

## 2024-01-04 LAB — TRIGLYCERIDES: Triglycerides: 105 mg/dL (ref <150)

## 2024-01-04 LAB — MAGNESIUM: Magnesium: 1.7 mg/dL (ref 1.7–2.4)

## 2024-01-04 SURGERY — IRRIGATION AND DEBRIDEMENT WOUND
Anesthesia: General

## 2024-01-04 MED ORDER — KETAMINE HCL 50 MG/5ML IJ SOSY
PREFILLED_SYRINGE | INTRAMUSCULAR | Status: DC | PRN
  Administered 2024-01-04: 50 mg via INTRAVENOUS

## 2024-01-04 MED ORDER — DEXMEDETOMIDINE HCL IN NACL 80 MCG/20ML IV SOLN
INTRAVENOUS | Status: DC | PRN
Start: 2024-01-04 — End: 2024-01-04
  Administered 2024-01-04: 8 ug via INTRAVENOUS

## 2024-01-04 MED ORDER — LACTATED RINGERS IV SOLN: INTRAVENOUS | Status: DC | PRN

## 2024-01-04 MED ORDER — FENTANYL CITRATE (PF) 100 MCG/2ML IJ SOLN
INTRAMUSCULAR | Status: DC | PRN
  Administered 2024-01-04: 100 ug via INTRAVENOUS

## 2024-01-04 MED ORDER — HYDROMORPHONE HCL 1 MG/ML IJ SOLN
0.2500 mg | INTRAMUSCULAR | Status: DC | PRN
Start: 2024-01-04 — End: 2024-01-04
  Administered 2024-01-04 (×2): 0.5 mg via INTRAVENOUS

## 2024-01-04 MED ORDER — TRAVASOL 10 % IV SOLN
INTRAVENOUS | Status: AC
  Filled 2024-01-04: qty 1081.2

## 2024-01-04 MED ORDER — PIPERACILLIN-TAZOBACTAM 3.375 G IVPB
3.3750 g | Freq: Three times a day (TID) | INTRAVENOUS | Status: DC
  Administered 2024-01-04 – 2024-01-10 (×20): 3.375 g via INTRAVENOUS
  Administered 2024-01-11: 50 mL via INTRAVENOUS
  Administered 2024-01-11 – 2024-01-13 (×7): 3.375 g via INTRAVENOUS
  Filled 2024-01-04 (×27): qty 50

## 2024-01-04 MED ORDER — PROPOFOL 10 MG/ML IV BOLUS
INTRAVENOUS | Status: DC | PRN
  Administered 2024-01-04: 120 mg via INTRAVENOUS

## 2024-01-04 MED ORDER — ROCURONIUM BROMIDE 10 MG/ML (PF) SYRINGE
PREFILLED_SYRINGE | INTRAVENOUS | Status: AC
  Filled 2024-01-04: qty 10

## 2024-01-04 MED ORDER — DROPERIDOL 2.5 MG/ML IJ SOLN: 0.6250 mg | Freq: Once | INTRAMUSCULAR | Status: DC | PRN

## 2024-01-04 MED ORDER — SODIUM CHLORIDE 0.9 % IR SOLN
Status: DC | PRN
  Administered 2024-01-04: 1000 mL

## 2024-01-04 MED ORDER — LIDOCAINE HCL (CARDIAC) PF 100 MG/5ML IV SOSY
PREFILLED_SYRINGE | INTRAVENOUS | Status: DC | PRN
  Administered 2024-01-04: 30 mg via INTRAVENOUS

## 2024-01-04 MED ORDER — HYDROMORPHONE HCL 1 MG/ML IJ SOLN
INTRAMUSCULAR | Status: AC
  Filled 2024-01-04: qty 1

## 2024-01-04 MED ORDER — ROCURONIUM BROMIDE 100 MG/10ML IV SOLN
INTRAVENOUS | Status: DC | PRN
  Administered 2024-01-04: 40 mg via INTRAVENOUS

## 2024-01-04 MED ORDER — LIDOCAINE HCL (PF) 2 % IJ SOLN
INTRAMUSCULAR | Status: AC
  Filled 2024-01-04: qty 5

## 2024-01-04 MED ORDER — HYDROMORPHONE HCL 1 MG/ML IJ SOLN
0.2500 mg | INTRAMUSCULAR | Status: DC | PRN
  Administered 2024-01-04 (×4): 0.5 mg via INTRAVENOUS

## 2024-01-04 MED ORDER — MIDAZOLAM HCL 2 MG/2ML IJ SOLN
INTRAMUSCULAR | Status: AC
  Filled 2024-01-04: qty 2

## 2024-01-04 MED ORDER — FENTANYL CITRATE (PF) 100 MCG/2ML IJ SOLN
INTRAMUSCULAR | Status: AC
  Filled 2024-01-04: qty 2

## 2024-01-04 MED ORDER — ONDANSETRON HCL 4 MG/2ML IJ SOLN
INTRAMUSCULAR | Status: AC
  Filled 2024-01-04: qty 2

## 2024-01-04 MED ORDER — SUGAMMADEX SODIUM 200 MG/2ML IV SOLN
INTRAVENOUS | Status: DC | PRN
  Administered 2024-01-04: 200 mg via INTRAVENOUS

## 2024-01-04 MED ORDER — MIDAZOLAM HCL 5 MG/5ML IJ SOLN
INTRAMUSCULAR | Status: DC | PRN
  Administered 2024-01-04: 2 mg via INTRAVENOUS

## 2024-01-04 MED ORDER — KETAMINE HCL 50 MG/5ML IJ SOSY
PREFILLED_SYRINGE | INTRAMUSCULAR | Status: AC
Start: 2024-01-04 — End: ?
  Filled 2024-01-04: qty 5

## 2024-01-04 MED ORDER — PROPOFOL 10 MG/ML IV BOLUS
INTRAVENOUS | Status: AC
  Filled 2024-01-04: qty 20

## 2024-01-04 MED ORDER — MAGNESIUM SULFATE 2 GM/50ML IV SOLN
2.0000 g | Freq: Once | INTRAVENOUS | Status: AC
  Administered 2024-01-04: 2 g via INTRAVENOUS
  Filled 2024-01-04: qty 50

## 2024-01-04 MED ORDER — CARMEX CLASSIC LIP BALM EX OINT
TOPICAL_OINTMENT | CUTANEOUS | Status: AC
  Filled 2024-01-04: qty 10

## 2024-01-04 SURGICAL SUPPLY — 34 items
BAG COUNTER SPONGE SURGICOUNT (BAG) IMPLANT
BLADE HEX COATED 2.75 (ELECTRODE) ×1 IMPLANT
BLADE SURG SZ10 CARB STEEL (BLADE) ×2 IMPLANT
CANISTER WOUNDNEG PRESSURE 500 (CANNISTER) IMPLANT
DERMABOND ADVANCED .7 DNX12 (GAUZE/BANDAGES/DRESSINGS) ×1 IMPLANT
DRAPE LAPAROTOMY T 102X78X121 (DRAPES) IMPLANT
DRAPE LAPAROTOMY TRNSV 102X78 (DRAPES) IMPLANT
DRAPE SHEET LG 3/4 BI-LAMINATE (DRAPES) IMPLANT
DRSG MEPILEX SACRM 8.7X9.8 (GAUZE/BANDAGES/DRESSINGS) IMPLANT
DRSG VAC GRANUFOAM MED (GAUZE/BANDAGES/DRESSINGS) IMPLANT
DRSG VERSA FOAM LRG 10X15 (GAUZE/BANDAGES/DRESSINGS) IMPLANT
ELECT REM PT RETURN 15FT ADLT (MISCELLANEOUS) ×1 IMPLANT
EVACUATOR SILICONE 100CC (DRAIN) IMPLANT
GAUZE SPONGE 4X4 12PLY STRL (GAUZE/BANDAGES/DRESSINGS) ×1 IMPLANT
GLOVE BIO SURGEON STRL SZ7.5 (GLOVE) ×2 IMPLANT
GLOVE BIOGEL PI IND STRL 7.0 (GLOVE) ×1 IMPLANT
GOWN STRL REUS W/ TWL XL LVL3 (GOWN DISPOSABLE) ×2 IMPLANT
KIT BASIN OR (CUSTOM PROCEDURE TRAY) ×1 IMPLANT
KIT TURNOVER KIT A (KITS) IMPLANT
MARKER SKIN DUAL TIP RULER LAB (MISCELLANEOUS) IMPLANT
NDL HYPO 25X1 1.5 SAFETY (NEEDLE) ×1 IMPLANT
NEEDLE HYPO 25X1 1.5 SAFETY (NEEDLE) ×1 IMPLANT
NS IRRIG 1000ML POUR BTL (IV SOLUTION) ×1 IMPLANT
PACK BASIC VI WITH GOWN DISP (CUSTOM PROCEDURE TRAY) ×1 IMPLANT
PENCIL SMOKE EVACUATOR (MISCELLANEOUS) IMPLANT
SOL PREP POV-IOD 4OZ 10% (MISCELLANEOUS) ×1 IMPLANT
SPIKE FLUID TRANSFER (MISCELLANEOUS) IMPLANT
SPONGE T-LAP 4X18 ~~LOC~~+RFID (SPONGE) ×1 IMPLANT
STAPLER SKIN PROX WIDE 3.9 (STAPLE) IMPLANT
SUT MNCRL AB 4-0 PS2 18 (SUTURE) IMPLANT
SUT VIC AB 3-0 SH 18 (SUTURE) IMPLANT
SYR CONTROL 10ML LL (SYRINGE) ×1 IMPLANT
TOWEL OR 17X26 10 PK STRL BLUE (TOWEL DISPOSABLE) ×1 IMPLANT
WATER STERILE IRR 1000ML POUR (IV SOLUTION) ×1 IMPLANT

## 2024-01-04 NOTE — Progress Notes (Signed)
 Patient returned from procedure. He is A&Ox4, c/o moderate pain to surgical site. Wound vac in place, alarming upon return with blockage alert. This was reported from PACU RN. WOC RN to assess.

## 2024-01-04 NOTE — Op Note (Signed)
 01/04/2024  9:56 AM  PATIENT:  Steven [name]  53 y.o. male  PRE-OPERATIVE DIAGNOSIS:  Sacral abcess  POST-OPERATIVE DIAGNOSIS:  Sacral abcess  PROCEDURE:  Procedure(s): IRRIGATION AND DEBRIDEMENT WOUND-20 x 15 cm; WOUND VAC REPLACEMENT (N/A)  SURGEON:  Surgeons and Role:    Axel Filler, MD - Primary  ASSISTANTS: none   ANESTHESIA:   general  EBL:  10 mL   BLOOD ADMINISTERED:none  DRAINS: none   LOCAL MEDICATIONS USED:  NONE  SPECIMEN:  No Specimen  DISPOSITION OF SPECIMEN:  N/A  [name]:  YES  TOURNIQUET:  * No tourniquets in log *  DICTATION: .Dragon Dictation Indication procedure: Patient is a 53 year old male with history of adenocarcinoma.  Patient has a large perineal wound which has been managed with subsequent debridements and dressing change.  Patient has a VAC in place and was brought to the operating for dressing change at this time.  Details of procedure: After the patient was consented he was taken back to the OR and placed in supine position with bilateral SCDs in place.  He underwent general trach intubation.  He was then placed in the prone position.  He was then prepped and draped in standard fashion.  A timeout was called and all facts verified.  At this time the previous sponges were removed.  There was a large amount of succus in the perineal area of the wound.  There is appear to be some necrotic tissue superficially on the sacrum as well as superficially on the skin layer.  At this time all sponges were removed and accounted for.  At this time cautery is used to maintain hemostasis and debridement of the superficial skin layer was undertaken.  I proceeded to visualize the areas on the left side of the sacrum.  There was a tunneling down to the gluteus.  There was a pocket of purulence that was seen.  This was in between the intermedius and the gluteus maximus.  All loculations were broken up.  The area was irrigated with sterile saline.  There was  some necrotic tissue on the left side of the wound.  This was debrided.  The right-sided wound did not tunnel.  There was some necrosis of the superficial subcutaneous tissue.  This was debrided.  The area in the perineum appeared to be vascular as well.  There was no purulence or debridement in this area.  At this time I proceeded to place a white sponge down the perineal area.  2 sponges were placed in the 2 tracts that were on the left side.  Sponge was placed on the superior portion of the wound as well as the midportion of the wound.  The drapes were placed on it.  Suction was activated and seem to hold a suction without a significant leak.  Patient tolerated the procedure well was taken to the recovery in stable condition.  2.  Tool used for debridement (curette, scapel, etc.)   cautery   3.  Frequency of surgical debridement.   5th  4.  Measurement of total devitalized tissue (wound surface) before and after surgical debridement.   20x15cm  5.  Area and depth of devitalized tissue removed from wound.  10cm  6.  Blood loss and description of tissue removed.  10cc  7.  Evidence of the progress of the wound's response to treatment.  A.  Current wound volume (current dimensions and depth).  20x15cm  B.  Presence (and extent of) of infection.  yes  C.  Presence (and extent of) of non viable tissue.  yes  D.  Other material in the wound that is expected to inhibit healing.  no  8.  Was there any viable tissue removed (measurements): no    PLAN OF CARE: Admit to inpatient   PATIENT DISPOSITION:  PACU - hemodynamically stable.   Delay start of Pharmacological VTE agent (>24hrs) due to surgical blood loss or risk of bleeding: not applicable

## 2024-01-04 NOTE — Progress Notes (Signed)
 Suction canister on wound vacuum replaced. No changes to settings on vac. No alarms at this time.

## 2024-01-04 NOTE — Progress Notes (Addendum)
 The patient has been encouraged to turn to assist with wound vacuum suction and to increase mobilization. Pt reports experiencing pain and is not agreeable at this time.

## 2024-01-04 NOTE — Anesthesia Procedure Notes (Signed)
 Procedure Name: Intubation Date/Time: 01/04/2024 9:16 AM  Performed by: Lezlie Lye, CRNAPre-anesthesia Checklist: Patient identified, Emergency Drugs available, Suction available and Patient being monitored Patient Re-evaluated:Patient Re-evaluated prior to induction Oxygen Delivery Method: Circle system utilized Preoxygenation: Pre-oxygenation with 100% oxygen Induction Type: IV induction Ventilation: Mask ventilation without difficulty Laryngoscope Size: Miller and 3 Grade View: Grade I Tube type: Oral Tube size: 7.5 mm Number of attempts: 1 Airway Equipment and Method: Stylet Placement Confirmation: ETT inserted through vocal cords under direct vision, positive ETCO2 and breath sounds checked- equal and bilateral Secured at: 24 cm Tube secured with: Tape Dental Injury: Teeth and Oropharynx as per pre-operative assessment

## 2024-01-04 NOTE — Progress Notes (Signed)
 PROGRESS NOTE  Steven [name]  ONG:[id number] DOB: 05/13/1971 DOA: 12/25/2023 PCP: Noberto Retort, MD   Brief Narrative: Patient is a 53 year old male with history of diabetes type 2, VTE on Xarelto, stage II rectal adenocarcinoma status post neoadjuvant concurrent chemoradiation in 2022 followed by low anterior resection complicated postoperative course with a chronic pelvic abscess, was admitted under general surgery service after he presented with perirectal wound bleeding.  Found to have acute blood loss anemia.  We were consulted for medical management, primarily  for diabetes.  He was recently admitted by general surgery service and was discharged home on 12/17/2023 after surgical debridement of his perineal wound.  Patient has been transfused with PRBCs during this hospitalization.  General surgery is the primary team.  Underwent excisional debridement, irrigation of sacral wound and application of wound VAC on 4/2.Plan for repeat debridement today  Assessment & Plan:  Principal Problem:   Anemia due to acute blood loss   Acute blood loss anemia/perineal wound: Secondary to bleeding from perineal wound.   Total of 6 units of blood transfusion during this hospitalization.  Continue to monitor H&H.  Last Hemoglobin of 8.6   Stage II rectal adenocarcinoma:Status post neoadjuvant concurrent chemoradiation in 2022 followed by low anterior resection complicated postoperative course with a chronic pelvic abscess, was admitted under general surgery service after he presented with perirectal wound bleeding Currently NPO.  Started on TPN.Started on sips.Plan for irrigation and debridgement of wound today  Diabetes type 2: He has been  on sliding scale only.   diabetic coordinator consulted.  Last A1c in the range of 7.5.  A1c of 10.3 about a month ago.  Monitor blood sugars  History of VTE: On Xarelto, currently on hold  Leukocytosis: Improving.  Currently on Zosyn.  Cultures have been negative  so far. UA done on 4/1 showed some WBC but patient is already on Zosyn.   Nutrition Problem: Increased nutrient needs Etiology: wound healing    DVT prophylaxis:SCDs Start: 12/25/23 0912     Code Status: Full Code   Antimicrobials:  Anti-infectives (From admission, onward)    Start     Dose/Rate Route Frequency Ordered Stop   12/25/23 1800  [MAR Hold]  piperacillin-tazobactam (ZOSYN) IVPB 3.375 g        (MAR Hold since Mon 01/04/2024 at 0837.Hold Reason: Transfer to a Procedural area)  Note to Pharmacy: Pharmacy to check dosing >> approved by [facility]   3.375 g 12.5 mL/hr over 240 Minutes Intravenous Every 8 hours 12/25/23 1713         Subjective: Patient seen at bedside.  Hemodynamically stable.  Lying in bed, sleeping.  He had some pain earlier this morning and was given pain medication.  Sister at bedside.  Objective: Vitals:   01/04/24 1045 01/04/24 1100 01/04/24 1115 01/04/24 1130  BP: 115/84 130/72 129/74 124/67  Pulse: (!) 117 (!) 113 (!) 118 (!) 112  Resp: 11 15 14 16   Temp:      TempSrc:      SpO2: 100% 98% 97% 97%  Weight:      Height:        Intake/Output Summary (Last 24 hours) at 01/04/2024 1142 Last data filed at 01/04/2024 1115 Gross per 24 hour  Intake 2326.7 ml  Output 3625 ml  Net -1298.3 ml   Filed Weights   12/30/23 1031 12/31/23 0300 01/01/24 0500  Weight: 81.5 kg 79.2 kg 78.5 kg    Examination:  General exam: Overall comfortable, not in distress  HEENT: PERRL Respiratory system:  no wheezes or crackles  Cardiovascular system: S1 & S2 heard, RRR.  Gastrointestinal system: Abdomen is nondistended, soft and nontender. Central nervous system: sleeping Extremities: No edema, no clubbing ,no cyanosis,picc line Skin:perineal wound   Data Reviewed: I have personally reviewed following labs and imaging studies  CBC: Recent Labs  Lab 12/29/23 0323 12/29/23 0546 12/29/23 1812 12/30/23 0628 01/01/24 0525 01/01/24 1332 01/01/24 1423  01/01/24 1645 01/02/24 0517 01/03/24 0849  WBC 19.5* 34.8*  --  19.2* 11.1*  --   --   --  10.8*  --   NEUTROABS  --   --   --   --   --   --   --   --  8.8*  --   HGB 8.0* 8.0*   < > 7.9* 6.5* 7.5* 8.2* 9.1* 7.8* 8.6*  HCT 25.8* 26.0*   < > 24.1* 20.3* 22.0* 24.0* 27.8* 24.2* 27.0*  MCV 79.4* 79.5*  --  80.1 82.9  --   --   --  84.3  --   PLT 516* 733*  --  490* 462*  --   --   --  444*  --    < > = values in this interval not displayed.   Basic Metabolic Panel: Recent Labs  Lab 12/30/23 0623 12/31/23 0617 01/01/24 0525 01/01/24 1332 01/01/24 1423 01/02/24 0514 01/03/24 0525 01/04/24 0425  NA 130* 131* 135 134* 135 134* 134* 131*  K 4.8 4.2 3.9 6.2* 4.3 4.1 4.3 4.3  CL 96* 98 101 101 101 103 101 100  CO2 24 23 26   --   --  26 25 23   GLUCOSE 251* 257* 145* 667* 193* 207* 116* 151*  BUN 27* 29* 25* 20 19 20 20 20   CREATININE 0.85 0.85 0.70 0.80 0.70 0.56* 0.36* 0.54*  CALCIUM 7.9* 7.7* 7.7*  --   --  7.8* 7.8* 7.7*  MG 2.2 2.1  --   --   --  1.9  --  1.7  PHOS 3.6 3.1  --   --   --  2.8  --  3.0     Recent Results (from the past 240 hours)  MRSA Next Gen by PCR, Nasal     Status: None   Collection Time: 12/25/23  4:38 PM   Specimen: Nasal Mucosa; Nasal Swab  Result Value Ref Range Status   MRSA by PCR Next Gen NOT DETECTED NOT DETECTED Final    Comment: (NOTE) The GeneXpert MRSA Assay (FDA approved for NASAL specimens only), is one component of a comprehensive MRSA colonization surveillance program. It is not intended to diagnose MRSA infection nor to guide or monitor treatment for MRSA infections. Test performance is not FDA approved in patients less than 5 years old. Performed at [facility], [address] [place], [place] [address]      Radiology Studies: No results found.   Scheduled Meds:  [MAR Hold] carbamide peroxide  5 drop Both EARS BID   [MAR Hold] Chlorhexidine Gluconate Cloth  6 each Topical Q2200   [MAR Hold] feeding  supplement  1 Container Oral TID BM   HYDROmorphone       HYDROmorphone       HYDROmorphone       [MAR Hold] insulin aspart  0-20 Units Subcutaneous Q4H   lip balm       [MAR Hold] magic mouthwash w/lidocaine  10 mL Oral TID   [MAR Hold] sodium chloride flush  10-40 mL Intracatheter Q12H   Continuous Infusions:  [MAR Hold] piperacillin-tazobactam (ZOSYN)  IV Stopped (01/04/24 0865)   TPN ADULT (ION) 85 mL/hr at 01/04/24 0400   TPN ADULT (ION)       LOS: 10 days   Burnadette Pop, MD [facility] P4/03/2024, 11:42 AM

## 2024-01-04 NOTE — Consult Note (Signed)
 Discussed case with Jettie Pagan Bedside nurse to change canister [place] # [phone]) first if this does not fix alarm. Change TRAC pad second [place] # [phone]).   Thandiwe Siragusa [facility], CNS, [employer] [phone]

## 2024-01-04 NOTE — Progress Notes (Signed)
 This RN asked the patient about taking a bed bath and he expressed that he'd like to only receive a CHG wipe down as he states that he's in an immense amount of pain most times, and especially during turns/baths.

## 2024-01-04 NOTE — Anesthesia Preprocedure Evaluation (Signed)
 Anesthesia Evaluation  Patient identified by MRN, date of birth, ID band Patient awake    Reviewed: Allergy & Precautions, NPO status , Patient's Chart, lab work & pertinent test results  History of Anesthesia Complications Negative for: history of anesthetic complications  Airway Mallampati: III  TM Distance: >3 FB Neck ROM: Full   Comment: Previous grade I view with MAC 3, easy mask Dental  (+) Dental Advisory Given   Pulmonary neg shortness of breath, asthma , sleep apnea , neg COPD, neg recent URI, PE   Pulmonary exam normal breath sounds clear to auscultation       Cardiovascular hypertension, (-) angina + Peripheral Vascular Disease  (-) Past MI, (-) Cardiac Stents and (-) CABG Normal cardiovascular exam(-) dysrhythmias  Rhythm:Regular Rate:Normal  HLD  TTE 07/06/2020: IMPRESSIONS    1. Left ventricular ejection fraction, by estimation, is 55 to 60%. The  left ventricle has normal function. The left ventricle has no regional  wall motion abnormalities. Left ventricular diastolic parameters are  consistent with Grade I diastolic  dysfunction (impaired relaxation).   2. Right ventricular systolic function is mildly reduced. The right  ventricular size is mildly enlarged. There is mildly elevated pulmonary  artery systolic pressure.   3. The mitral valve is normal in structure. No evidence of mitral valve  regurgitation. No evidence of mitral stenosis.   4. The aortic valve is grossly normal. Aortic valve regurgitation is not  visualized. No aortic stenosis is present.   5. Aortic dilatation noted. There is borderline dilatation of the  ascending aorta, measuring 38 mm.   6. The inferior vena cava is dilated in size with <50% respiratory  variability, suggesting right atrial pressure of 15 mmHg.     Neuro/Psych neg Seizures PSYCHIATRIC DISORDERS Anxiety Depression     Neuromuscular disease (peripheral neuropathy)     GI/Hepatic Neg liver ROS,GERD  ,,Rectal cancer, diverting ileostomy   Endo/Other  diabetes, Poorly Controlled, Type 2, Oral Hypoglycemic Agents    Renal/GU Renal disease     Musculoskeletal   Abdominal   Peds  Hematology  (+) Blood dyscrasia, anemia Lab Results      Component                Value               Date                      WBC                      18.1 (H)            12/25/2023                HGB                      5.8 (LL)            12/25/2023                HCT                      20.5 (L)            12/25/2023                MCV                      70.0 (L)  12/25/2023                PLT                      664 (H)             12/25/2023              Anesthesia Other Findings 53 y.o. male with medical history significant for diabetes, VTE on Xarelto, stage II adenocarcinoma status post neoadjuvant concurrent chemoradiation 2022 followed by low anterior resection and complicated postoperative course with chronic pelvic abscess now being admitted to the hospital by the surgical service due to acute blood loss anemia from perirectal wound.    Last Xarelto:  Last Farxiga:  Reproductive/Obstetrics                              Anesthesia Physical Anesthesia Plan  ASA: 3  Anesthesia Plan: General   Post-op Pain Management: Ofirmev IV (intra-op)*   Induction: Intravenous  PONV Risk Score and Plan: 3 and Ondansetron, Dexamethasone, Treatment may vary due to age or medical condition and Midazolam  Airway Management Planned: Oral ETT  Additional Equipment:   Intra-op Plan:   Post-operative Plan: Extubation in OR  Informed Consent: I have reviewed the patients History and Physical, chart, labs and discussed the procedure including the risks, benefits and alternatives for the proposed anesthesia with the patient or authorized representative who has indicated his/her understanding and acceptance.     Dental advisory  given  Plan Discussed with: CRNA  Anesthesia Plan Comments: (Risks of anesthesia explained at length. This includes, but is not limited to, sore throat, damage to teeth, lips gums, tongue and vocal cords, nausea and vomiting, reactions to medications, stroke, heart attack, and death. All patient questions were answered and the patient wishes to proceed.  )         Anesthesia Quick Evaluation

## 2024-01-04 NOTE — Progress Notes (Signed)
 Nutrition Follow-up  DOCUMENTATION CODES:      INTERVENTION:  - Continue TPN.   - TPN management per pharmacy.   - Diet per [name].  - Boost Breeze po TID, each supplement provides 250 kcal and 9 grams of protein  - Would recommend change to Ensure Plus High Protein BID once diet advanced past clear liquids.   - Monitor weight trends.   NUTRITION DIAGNOSIS:   Increased nutrient needs related to wound healing as evidenced by estimated needs. *ongoing  GOAL:   Patient will meet greater than or equal to 90% of their needs *met with TPN  MONITOR:    (TPN)  REASON FOR ASSESSMENT:   Consult New TPN/TNA  ASSESSMENT:   53 year old male with history of diabetes type 2, VTE on Xarelto, stage II rectal adenocarcinoma status post neoadjuvant concurrent chemoradiation in 2022 followed by low anterior resection complicated postoperative course with a chronic pelvic abscess was admitted under general surgery service after he presented with perirectal wound bleeding.  3/28: admitted, NPO->CLD->NPO 3/29: FLD 3/30: Soft diet 3/31: Heart Healthy/CHO modified 4/1: NPO->CLD 4/2: NPO; TPN initiated 4/4: CLD  Patient out of room in OR for I&D of sacral wound and wound vac replacement at time of visit.  Patient remains on goal TPN at 20mL/hr, providing 2085 kcals and 108g protein.   Patient advanced to a clear liquid diet 4/4. No meal intakes documented to determine intake the past few days. Patient was ordered [facility] yesterday and noted to have refused last night.   Due to ongoing need for I&D's and EC fistula, will increase calorie and protein needs. Discussed with pharmacy.    Admit weight: 188# Current weight: 173# I&O's: +4.1L since admit Per weight records, pt lost 15 lbs from 3/5 to 3/30 (8% wt loss x <1 month, significant for time frame).  Weight continues to trend down, however noted to be -4L. Will monitor trends.  Medications reviewed and include: -  Labs  reviewed:  Na 131 HA1C 7.5 Blood Glucose 107-178 x24 hours   NUTRITION - FOCUSED PHYSICAL EXAM:  Patient out of room in OR  Diet Order:   Diet Order             Diet NPO time specified Except for: Sips with Meds  Diet effective midnight                   EDUCATION NEEDS:  No education needs have been identified at this time  Skin:  Skin Assessment: Skin Integrity Issues: Skin Integrity Issues:: Incisions Incisions: 3/28 rectum  Last BM:  4/6 - colostomy  Height:  Ht Readings from Last 1 Encounters:  12/30/23 5\' 10"  (1.778 m)   Weight:  Wt Readings from Last 1 Encounters:  01/01/24 78.5 kg   BMI:  Body mass index is 24.83 kg/m.  Estimated Nutritional Needs:  Kcal:  2100-2350 kcals Protein:  120-135 grams Fluid:  >/= 2.1L    Shelle Iron RD, LDN Contact via Secure Chat.

## 2024-01-04 NOTE — Progress Notes (Signed)
 3 Days Post-Op   Subjective/Chief Complaint: PT with no changes OR this AM   Objective: Vital signs in last 24 hours: Temp:  [97.8 F (36.6 C)-100.9 F (38.3 C)] 100.3 F (37.9 C) (04/07 0352) Pulse Rate:  [92-107] 104 (04/07 0600) Resp:  [11-20] 14 (04/07 0600) BP: (111-155)/(56-76) 116/57 (04/07 0600) SpO2:  [95 %-100 %] 95 % (04/07 0600) Last BM Date : 01/03/24  Intake/Output from previous day: 04/06 0701 - 04/07 0700 In: 1826.7 [P.O.:50; I.V.:1623.1; IV Piggyback:153.6] Out: 3445 [Urine:3175; Drains:225; Stool:45] Intake/Output this shift: No intake/output data recorded.  General appearance: alert, cooperative, and no distress Resp: breathing comfortably Cardio: regular rate and rhythm GI: soft, non distended, ostomy with small amount stool/flatus  Lab Results:  Recent Labs    01/02/24 0517 01/03/24 0849  WBC 10.8*  --   HGB 7.8* 8.6*  HCT 24.2* 27.0*  PLT 444*  --    BMET Recent Labs    01/03/24 0525 01/04/24 0425  NA 134* 131*  K 4.3 4.3  CL 101 100  CO2 25 23  GLUCOSE 116* 151*  BUN 20 20  CREATININE 0.36* 0.54*  CALCIUM 7.8* 7.7*   Anti-infectives: Anti-infectives (From admission, onward)    Start     Dose/Rate Route Frequency Ordered Stop   12/25/23 1800  piperacillin-tazobactam (ZOSYN) IVPB 3.375 g       Note to Pharmacy: Pharmacy to check dosing >> approved by [facility]   3.375 g 12.5 mL/hr over 240 Minutes Intravenous Every 8 hours 12/25/23 1713         Assessment/Plan: IRRIGATION AND DEBRIDEMENT ABSCESS (N/A) HD#10, POD - open infected perineal wound with enteric fistula Hx of LAR 2022, redo LAR with colostomy 2024, s/p completion APR 11/2023 with ongoing wound necrosis OR dressing change today Control of bleeding from wound last week.  IV Zosyn NPO. Ok for coffee with cream, otherwise just sips of clears after surgery.  TNA, PICC line      DM with hyperglycemia - on insulin ABL anemia with anemia of chronic disease- stable.    LOS: 10 days    Axel Filler 01/04/2024

## 2024-01-04 NOTE — Progress Notes (Signed)
 Per PACU RN report wound vac in place and request primary RN to await assessment by surgical team/woc nurse. Primary RN may reinforce dressing only.

## 2024-01-04 NOTE — Progress Notes (Addendum)
 PHARMACY - TOTAL PARENTERAL NUTRITION CONSULT NOTE   Indication: EC Fistula  Patient Measurements: Height: 5\' 10"  (177.8 cm) Weight: 78.5 kg (173 lb 1 oz) IBW/kg (Calculated) : 73 TPN AdjBW (KG): 81.5 Body mass index is 24.83 kg/m. Usual Weight:   Assessment:  12 yoM w/ hx DM2, PE on Xarelto, Stage II rectal cancer s/p chemoradiation, low anterior & abdominoperineal resections with ileostomy and ultimately end colostomy placement earlier this year; now admitted 3/28 for sacrococcygeal ulceration with necrosis & bleeding Pharmacy consulted to manage TPN for nutrition support on 4/2 for EC fistula  Glucose / Insulin: DM2 on PO meds PTA CBGs 148 - 179 after TPN hung with 80 units insulin  (goal 100-150) Decadron 4 mg IV x 1 4/4 at 1600 in OR, rSSI 17 units/24 hrs 80 units insulin in TPN at 85 ml/hr Electrolytes: WNL x  Na 131 with max Na in TPN, Mag 1.7, phos 3 Renal: BUN WNL; SCr stable WNL; UOP remains adequate Hepatic: Albumin low, LFTs, Tbili WNL Trig WNL I/O:  wound vac 225/24 hrs PO intake: 50 mls clears Stool 45/24 hrs UOP 3175/24 hrs - MIVF: none GI Imaging:  - 4/1 CT A/P: combination of oral contrast and air between bowel and sacrococcygeal wound suggesting combination of perforation/fistulization and necrotic infection GI Surgeries / Procedures:  3/28 EUA for bleeding from perineal wound 4/2: debridement and wound vac placement 4/4  wound exploration, VAC dressing change 4/7 repeat dressing change Central access: PICC ordered for TPN 4/2 TPN start date: 12/30/2023  Nutritional Goals: RD Assessment: Estimated Needs Total Energy Estimated Needs: 1900-2100 Total Protein Estimated Needs: 100-110g Total Fluid Estimated Needs: 2L/day  Goal TPN rate is 85 mL/hr (provides 108 g of protein and 2085 kcals per day)  Current Nutrition:  TPN 4/4 sips of clears, OK for coffee w/ cream 4/6 resource breeze TID ordered by TRH> refused  Plan:  Now: Mag 2 gm IVPB Continue  TPN @ goal rate 85 mL/hr;  electrolytes in TPN:  no change Na - 150 mEq/L K - 50 mEq/L Ca - 5 mEq/L Mg - 5 mEq/L Phos - 15 mmol/L Cl:Ac ratio - 1:1 Add standard MVI and trace elements to TPN Chromium on hold d/t national shortage continue SSI resistant scale and continue 80 units insulin in TPN  Monitor TPN labs on Mon/Thurs  Herby Abraham, Pharm.D Use secure chat for questions 01/04/2024 7:22 AM

## 2024-01-04 NOTE — OR Nursing (Signed)
 Scrum dressing replaced prior to prep

## 2024-01-04 NOTE — Transfer of Care (Signed)
 Immediate Anesthesia Transfer of Care Note  Patient: Steven [name]  Procedure(s) Performed: IRRIGATION AND DEBRIDEMENT WOUND; WOUND VAC REPLACEMENT  Patient Location: PACU  Anesthesia Type:General  Level of Consciousness: sedated  Airway & Oxygen Therapy: Patient Spontanous Breathing and Patient connected to face mask oxygen  Post-op Assessment: Report given to RN and Post -op Vital signs reviewed and stable  Post vital signs: Reviewed and stable  Last Vitals:  Vitals Value Taken Time  BP 103/54 01/04/24 1007  Temp 97   Pulse 105 01/04/24 1010  Resp 22 01/04/24 1010  SpO2 100 % 01/04/24 1010  Vitals shown include unfiled device data.  Last Pain:  Vitals:   01/04/24 0745  TempSrc:   PainSc: 7       Patients Stated Pain Goal: 2 (01/03/24 0740)  Complications: No notable events documented.

## 2024-01-05 ENCOUNTER — Other Ambulatory Visit (HOSPITAL_COMMUNITY): Payer: Self-pay

## 2024-01-05 ENCOUNTER — Telehealth (HOSPITAL_COMMUNITY): Payer: Self-pay | Admitting: Pharmacy Technician

## 2024-01-05 ENCOUNTER — Encounter (HOSPITAL_COMMUNITY): Payer: Self-pay | Admitting: General Surgery

## 2024-01-05 DIAGNOSIS — Z79899 Other long term (current) drug therapy: Secondary | ICD-10-CM

## 2024-01-05 DIAGNOSIS — Z515 Encounter for palliative care: Secondary | ICD-10-CM | POA: Diagnosis not present

## 2024-01-05 DIAGNOSIS — R4589 Other symptoms and signs involving emotional state: Secondary | ICD-10-CM

## 2024-01-05 DIAGNOSIS — K651 Peritoneal abscess: Secondary | ICD-10-CM | POA: Diagnosis not present

## 2024-01-05 DIAGNOSIS — D62 Acute posthemorrhagic anemia: Secondary | ICD-10-CM | POA: Diagnosis not present

## 2024-01-05 DIAGNOSIS — Z9889 Other specified postprocedural states: Secondary | ICD-10-CM

## 2024-01-05 DIAGNOSIS — R710 Precipitous drop in hematocrit: Principal | ICD-10-CM

## 2024-01-05 DIAGNOSIS — Z7189 Other specified counseling: Secondary | ICD-10-CM

## 2024-01-05 LAB — CBC
HCT: 22.8 % — ABNORMAL LOW (ref 39.0–52.0)
HCT: 21.7 % — ABNORMAL LOW (ref 39.0–52.0)
Hemoglobin: 7.3 g/dL — ABNORMAL LOW (ref 13.0–17.0)
Hemoglobin: 6.9 g/dL — CL (ref 13.0–17.0)
MCH: 29.3 pg (ref 26.0–34.0)
MCH: 26.8 pg (ref 26.0–34.0)
MCHC: 32 g/dL (ref 30.0–36.0)
MCHC: 31.8 g/dL (ref 30.0–36.0)
MCV: 91.6 fL (ref 80.0–100.0)
MCV: 84.4 fL (ref 80.0–100.0)
Platelets: 424 10*3/uL — ABNORMAL HIGH (ref 150–400)
Platelets: 412 10*3/uL — ABNORMAL HIGH (ref 150–400)
RBC: 2.49 MIL/uL — ABNORMAL LOW (ref 4.22–5.81)
RBC: 2.57 MIL/uL — ABNORMAL LOW (ref 4.22–5.81)
RDW: 21.2 % — ABNORMAL HIGH (ref 11.5–15.5)
RDW: 20.7 % — ABNORMAL HIGH (ref 11.5–15.5)
WBC: 8.2 10*3/uL (ref 4.0–10.5)
WBC: 8.3 10*3/uL (ref 4.0–10.5)
nRBC: 0 % (ref 0.0–0.2)
nRBC: 0 % (ref 0.0–0.2)

## 2024-01-05 LAB — GLUCOSE, CAPILLARY
Glucose-Capillary: 141 mg/dL — ABNORMAL HIGH (ref 70–99)
Glucose-Capillary: 119 mg/dL — ABNORMAL HIGH (ref 70–99)
Glucose-Capillary: 117 mg/dL — ABNORMAL HIGH (ref 70–99)
Glucose-Capillary: 145 mg/dL — ABNORMAL HIGH (ref 70–99)
Glucose-Capillary: 141 mg/dL — ABNORMAL HIGH (ref 70–99)

## 2024-01-05 LAB — PREPARE RBC (CROSSMATCH)

## 2024-01-05 MED ORDER — OXYCODONE HCL 5 MG PO TABS
5.0000 mg | ORAL_TABLET | ORAL | Status: DC | PRN
  Administered 2024-01-05 (×3): 10 mg via ORAL
  Administered 2024-01-06: 5 mg via ORAL
  Administered 2024-01-06: 10 mg via ORAL
  Filled 2024-01-05 (×5): qty 2

## 2024-01-05 MED ORDER — TRAVASOL 10 % IV SOLN
INTRAVENOUS | Status: AC
  Filled 2024-01-05: qty 1231.2

## 2024-01-05 MED ORDER — INSULIN GLARGINE-YFGN 100 UNIT/ML ~~LOC~~ SOLN
40.0000 [IU] | Freq: Two times a day (BID) | SUBCUTANEOUS | Status: DC
  Administered 2024-01-05: 40 [IU] via SUBCUTANEOUS
  Filled 2024-01-05 (×2): qty 0.4

## 2024-01-05 MED ORDER — METHOCARBAMOL 1000 MG/10ML IJ SOLN
500.0000 mg | Freq: Four times a day (QID) | INTRAMUSCULAR | Status: DC
  Administered 2024-01-05 – 2024-01-13 (×33): 500 mg via INTRAVENOUS
  Filled 2024-01-05 (×30): qty 10

## 2024-01-05 MED ORDER — ACETAMINOPHEN 500 MG PO TABS
1000.0000 mg | ORAL_TABLET | Freq: Three times a day (TID) | ORAL | Status: DC
  Administered 2024-01-05 – 2024-01-13 (×23): 1000 mg via ORAL
  Filled 2024-01-05 (×25): qty 2

## 2024-01-05 MED ORDER — HYDROMORPHONE HCL 1 MG/ML IJ SOLN
2.0000 mg | INTRAMUSCULAR | Status: DC | PRN
  Administered 2024-01-05 – 2024-01-06 (×8): 2 mg via INTRAVENOUS
  Administered 2024-01-07 (×2): .4 mg via INTRAVENOUS
  Administered 2024-01-07: .2 mg via INTRAVENOUS
  Administered 2024-01-07: 2 mg via INTRAVENOUS
  Administered 2024-01-07: .6 mg via INTRAVENOUS
  Administered 2024-01-07: .4 mg via INTRAVENOUS
  Filled 2024-01-05 (×9): qty 2

## 2024-01-05 MED ORDER — INSULIN ASPART 100 UNIT/ML IJ SOLN
0.0000 [IU] | INTRAMUSCULAR | Status: DC
  Administered 2024-01-05: 3 [IU] via SUBCUTANEOUS
  Administered 2024-01-06 (×2): 4 [IU] via SUBCUTANEOUS
  Administered 2024-01-06: 3 [IU] via SUBCUTANEOUS

## 2024-01-05 MED ORDER — INSULIN ASPART 100 UNIT/ML IJ SOLN
0.0000 [IU] | Freq: Three times a day (TID) | INTRAMUSCULAR | Status: DC
  Administered 2024-01-05: 3 [IU] via SUBCUTANEOUS

## 2024-01-05 MED ORDER — TRAVASOL 10 % IV SOLN
INTRAVENOUS | Status: DC
  Filled 2024-01-05: qty 1081.2

## 2024-01-05 MED ORDER — JUVEN PO PACK
1.0000 | PACK | Freq: Two times a day (BID) | ORAL | Status: DC
  Administered 2024-01-05: 1 via ORAL
  Filled 2024-01-05 (×2): qty 1

## 2024-01-05 MED ORDER — ACETAMINOPHEN 500 MG PO TABS: 1000.0000 mg | ORAL_TABLET | Freq: Four times a day (QID) | ORAL | Status: DC

## 2024-01-05 MED ORDER — SODIUM CHLORIDE 0.9% IV SOLUTION: Freq: Once | INTRAVENOUS | Status: AC

## 2024-01-05 MED ORDER — LORAZEPAM 2 MG/ML IJ SOLN
0.5000 mg | Freq: Four times a day (QID) | INTRAMUSCULAR | Status: DC | PRN
  Administered 2024-01-06 – 2024-01-13 (×11): 0.5 mg via INTRAVENOUS
  Filled 2024-01-05 (×13): qty 1

## 2024-01-05 MED ORDER — MORPHINE SULFATE ER 15 MG PO TBCR
15.0000 mg | EXTENDED_RELEASE_TABLET | Freq: Two times a day (BID) | ORAL | Status: DC
  Administered 2024-01-05 – 2024-01-06 (×2): 15 mg via ORAL
  Filled 2024-01-05 (×2): qty 1

## 2024-01-05 NOTE — Progress Notes (Signed)
 PROGRESS NOTE  Steven [name]  GMW:[id number] DOB: [birth year] DOA: 12/25/2023 PCP: Noberto Retort, MD   Brief Narrative: Patient is a 53 year old male with history of diabetes type 2, VTE on Xarelto, stage II rectal adenocarcinoma status post neoadjuvant concurrent chemoradiation in 2022 followed by low anterior resection complicated postoperative course with a chronic pelvic abscess, was admitted under general surgery service after he presented with perirectal wound bleeding.  Found to have acute blood loss anemia.  We were consulted for medical management, primarily  for diabetes.  He was recently admitted by general surgery service and was discharged home on 12/17/2023 after surgical debridement of his perineal wound.  Patient has been transfused with PRBCs during this hospitalization.  General surgery is the primary team.  Underwent excisional debridement, irrigation of sacral wound and application of wound VAC on 4/2.Underwent repeat debridement on 4/7.  Hospital course remarkable for persistent pain on the perineal area  Assessment & Plan:  Principal Problem:   Anemia due to acute blood loss   Acute blood loss anemia/perineal wound: Secondary to bleeding from perineal wound.   Total of 6 units of blood transfusion during this hospitalization.  Continue to monitor H&H.  Last Hemoglobin in the range of 7  Stage II rectal adenocarcinoma:Status post neoadjuvant concurrent chemoradiation in 2022 followed by low anterior resection complicated postoperative course with a chronic pelvic abscess, was admitted under general surgery service after he presented with perirectal wound bleeding.  Started on TPN.currently on clear liquid diet.  Also has ostomy.Underwent excisional debridement, irrigation of sacral wound and application of wound VAC on 4/2.Underwent repeat debridement on 4/7.   Diabetes type 2: He has been  on sliding scale only.   diabetic coordinator consulted.  Last A1c in the range of  7.5.  A1c of 10.3 about a month ago.  Monitor blood sugars  History of VTE: On Xarelto, currently on hold  Leukocytosis: Improving.  Currently on Zosyn.  Cultures have been negative so far. UA done on 4/1 showed some WBC but patient is already on Zosyn.   Nutrition Problem: Increased nutrient needs Etiology: wound healing    DVT prophylaxis:SCDs Start: 12/25/23 0912     Code Status: Full Code   Antimicrobials:  Anti-infectives (From admission, onward)    Start     Dose/Rate Route Frequency Ordered Stop   01/04/24 1400  piperacillin-tazobactam (ZOSYN) IVPB 3.375 g       Note to Pharmacy: Pharmacy to check dosing >> approved by [facility]   3.375 g 12.5 mL/hr over 240 Minutes Intravenous Every 8 hours 01/04/24 1257     12/25/23 1800  piperacillin-tazobactam (ZOSYN) IVPB 3.375 g  Status:  Discontinued       Note to Pharmacy: Pharmacy to check dosing >> approved by [facility]   3.375 g 12.5 mL/hr over 240 Minutes Intravenous Every 8 hours 12/25/23 1713 01/04/24 1257       Subjective: Patient seen and examined at the bedside today.  Hemodynamically stable.  Lying in bed.  Complains of some pain on the perineal area.  Overall looks comfortable.No new complains  Objective: Vitals:   01/05/24 0800 01/05/24 0900 01/05/24 0905 01/05/24 1000  BP: (!) 113/53 110/61  (!) 101/49  Pulse: 89 89 89 86  Resp: 12 14 12 14   Temp: 98.2 F (36.8 C)     TempSrc: Oral     SpO2: 100% 97% 98% 98%  Weight:      Height:        Intake/Output Summary (  Last 24 hours) at 01/05/2024 1113 Last data filed at 01/05/2024 0900 Gross per 24 hour  Intake 2562.98 ml  Output 2550 ml  Net 12.98 ml   Filed Weights   12/30/23 1031 12/31/23 0300 01/01/24 0500  Weight: 81.5 kg 79.2 kg 78.5 kg    Examination:  General exam: Overall comfortable, not in distress HEENT: PERRL Respiratory system:  no wheezes or crackles  Cardiovascular system: S1 & S2 heard, RRR.  Gastrointestinal system: Abdomen is nondistended,  soft and nontender.  Ostomy Central nervous system: Alert and oriented Extremities: No edema, no clubbing ,no cyanosis, PICC line Skin: Perineal wound   Data Reviewed: I have personally reviewed following labs and imaging studies  CBC: Recent Labs  Lab 12/30/23 0628 01/01/24 0525 01/01/24 1332 01/01/24 1423 01/01/24 1645 01/02/24 0517 01/03/24 0849 01/05/24 0505  WBC 19.2* 11.1*  --   --   --  10.8*  --  8.2  NEUTROABS  --   --   --   --   --  8.8*  --   --   HGB 7.9* 6.5*   < > 8.2* 9.1* 7.8* 8.6* 7.3*  HCT 24.1* 20.3*   < > 24.0* 27.8* 24.2* 27.0* 22.8*  MCV 80.1 82.9  --   --   --  84.3  --  91.6  PLT 490* 462*  --   --   --  444*  --  424*   < > = values in this interval not displayed.   Basic Metabolic Panel: Recent Labs  Lab 12/30/23 0623 12/31/23 0617 01/01/24 0525 01/01/24 1332 01/01/24 1423 01/02/24 0514 01/03/24 0525 01/04/24 0425  NA 130* 131* 135 134* 135 134* 134* 131*  K 4.8 4.2 3.9 6.2* 4.3 4.1 4.3 4.3  CL 96* 98 101 101 101 103 101 100  CO2 24 23 26   --   --  26 25 23   GLUCOSE 251* 257* 145* 667* 193* 207* 116* 151*  BUN 27* 29* 25* 20 19 20 20 20   CREATININE 0.85 0.85 0.70 0.80 0.70 0.56* 0.36* 0.54*  CALCIUM 7.9* 7.7* 7.7*  --   --  7.8* 7.8* 7.7*  MG 2.2 2.1  --   --   --  1.9  --  1.7  PHOS 3.6 3.1  --   --   --  2.8  --  3.0     No results found for this or any previous visit (from the past 240 hours).    Radiology Studies: No results found.   Scheduled Meds:  acetaminophen  1,000 mg Oral Q6H   carbamide peroxide  5 drop Both EARS BID   Chlorhexidine Gluconate Cloth  6 each Topical Q2200   insulin aspart  0-20 Units Subcutaneous Q4H   magic mouthwash w/lidocaine  10 mL Oral TID   methocarbamol (ROBAXIN) injection  500 mg Intravenous Q6H   nutrition supplement (JUVEN)  1 packet Oral BID BM   sodium chloride flush  10-40 mL Intracatheter Q12H   Continuous Infusions:  piperacillin-tazobactam (ZOSYN)  IV Stopped (01/05/24 1044)    TPN ADULT (ION) 85 mL/hr at 01/05/24 0900     LOS: 11 days   Burnadette Pop, MD [facility] P4/04/2024, 11:13 AM

## 2024-01-05 NOTE — Progress Notes (Signed)
 1 Day Post-Op   Subjective/Chief Complaint: Complains of significant pain in his hip and sacrum with rolling on his sides as is necessary for getting cleaned up. Tolerating liquids.  He is inquires about a palliative medicine consult for discussion of his pain   Objective: Vital signs in last 24 hours: Temp:  [98 F (36.7 C)-100.4 F (38 C)] 98.3 F (36.8 C) (04/08 0400) Pulse Rate:  [86-118] 90 (04/07 2000) Resp:  [11-22] 18 (04/07 2000) BP: (97-130)/(43-84) 97/43 (04/08 0034) SpO2:  [90 %-100 %] 97 % (04/07 2000) Last BM Date : 01/04/24  Intake/Output from previous day: 04/07 0701 - 04/08 0700 In: 2585.7 [I.V.:2507; IV Piggyback:78.7] Out: 2580 [Urine:1750; Drains:800; Stool:20; Blood:10] Intake/Output this shift: No intake/output data recorded.  General appearance: alert, cooperative, and no distress Resp: breathing comfortably Cardio: regular rate and rhythm GI: soft, non distended, ostomy with gas and small amount of liquid stool. Vac cannister with bloody output MSK: bilateral feet edematous  Lab Results:  Recent Labs    01/03/24 0849 01/05/24 0505  WBC  --  8.2  HGB 8.6* 7.3*  HCT 27.0* 22.8*  PLT  --  424*   BMET Recent Labs    01/03/24 0525 01/04/24 0425  NA 134* 131*  K 4.3 4.3  CL 101 100  CO2 25 23  GLUCOSE 116* 151*  BUN 20 20  CREATININE 0.36* 0.54*  CALCIUM 7.8* 7.7*   Anti-infectives: Anti-infectives (From admission, onward)    Start     Dose/Rate Route Frequency Ordered Stop   01/04/24 1400  piperacillin-tazobactam (ZOSYN) IVPB 3.375 g       Note to Pharmacy: Pharmacy to check dosing >> approved by [facility]   3.375 g 12.5 mL/hr over 240 Minutes Intravenous Every 8 hours 01/04/24 1257     12/25/23 1800  piperacillin-tazobactam (ZOSYN) IVPB 3.375 g  Status:  Discontinued       Note to Pharmacy: Pharmacy to check dosing >> approved by [facility]   3.375 g 12.5 mL/hr over 240 Minutes Intravenous Every 8 hours 12/25/23 1713 01/04/24 1257        Assessment/Plan: HD#11 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 Back to OR 4/10 for vac change. 800 ml out of vac in 24 h Overall control of bleeding from wound last week, however hgb 7.3 this am from 8.6. repeat today. May need transfusion prbcs (likely transfusion reaction to FFP this admission but has tolerated prbcs) Tmax 100.26F last 24h. Continue IV Zosyn (3/28>>) NPO. Ok for coffee with cream, otherwise just sips of clears after surgery.  TNA, PICC line Poor pain control with movement. Will review pain regimen and palliative care consult placed   FEN: NPO/sips, TPN ID: zosyn 3/28> VTE: SCDs   DM with hyperglycemia - on insulin ABL anemia with anemia of chronic disease- as above - repeat hgb this afternoon History of VTE on xarelto at baseline. Hold anticoag  LOS: 11 days    Eric Form, [facility] Surgery 01/05/2024, 8:06 AM Please see Amion for pager number during day hours 7:00am-4:30pm

## 2024-01-05 NOTE — Consult Note (Signed)
 Consultation Note Date: 01/05/2024   Patient Name: Steven [name]  DOB: 03/06/1971  MRN: [id number]  Age / Sex: 53 y.o., male   PCP: Noberto Retort, MD Referring Physician: Montez Morita, Md, MD  Reason for Consultation: Pain control     Chief Complaint/History of Present Illness:  Patient is a 53 year old male with a past medical history of diabetes mellitus type 2, VTE on Xarelto, stage II rectal adenocarcinoma status post neoadjuvant concurrent chemoradiation in 2022 followed by a low anterior resection with a complicated postoperative course including need for ostomy with chronic pelvic abscess who was admitted on 12/25/2023 under general surgery service for management of perirectal wound bleeding.  During hospitalization, patient has required multiple transfusions for acute blood loss anemia secondary to perineal wound.  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  Palliative medicine team consulted to assist with pain management.  Extensive review of EMR prior to presenting to bedside.  At time of EMR review in past 24 hours patient has received as needed Norco/Vicodin 5-325 mg tablets with 10 mg x 3 doses.  Patient has also received as needed IV Dilaudid 1 mg x 8 doses and 0.5 mg x 4 doses. Methocarbamol 500 mg every 6 hours as per surgery. Review of recent CMP noted creatinine 0.54 and albumin <1.5.  Recent CBC noted hemoglobin 6.9 and platelets 412.  Presented to bedside to meet with patient.  Patient lying in bed.  Patient's wife present at bedside.  With permission, able to introduce myself as a member of the palliative medicine team and my role in patient's medical care.  Spent time learning about patient's medical journey up into this point.  Patient described how he thought cancer diagnosis was the worst thing ever and "the end of him".  Patient now stating that what he has gone  through since his cancer resection has been horrible beyond the cancer therapy itself.  Spent time providing emotional support via active listening.  Patient described difficulties with pain management.  Patient notes that he chronically has pain that is present on his backside from his wound.  Patient notes acute worsening of that pain in primarily his left hip and coccyx with any kind of movement, particularly related to cleaning and dressings.  Spent time discussing pain medications patient has received up into this point.  Patient has been receiving Norco 10 mg as needed.  Patient notes that the 10 mg dose makes him sleepy and when it comes to dressing changes this does not come close to managing the pain.  Patient also noted that the IV Dilaudid 1 mg dose does not touch his acute pain when he is having dressing changes and moving.  Patient described that he was screaming in agony yesterday despite receiving both of these medications prior to cleaning his dressings.  Patient notes that the pain is so severe that he has to be taken to the OR for anesthesia for debridement. Spent time discussing pain management moving forward.  Discussed based on patient's OME requirements, recommend patient to start on long-acting opioid to assist with overall pain management.  Based on patient's OME requirements, discussed can start long-acting morphine twice a day to determine how patient will tolerate this.  Will likely need further increases moving forward but will start at a low dose this evening.  Also discussed changing patient's Norco to oxycodone as she not have interference with Tylenol dosing.  Also discussed increasing patient's IV Dilaudid dosing particularly related to cleaning of sacral area.  Patient had lots of anxiety related to this pain as well also discussed use of low-dose Ativan as well if needed to assist with management so patient is not screaming and are getting.  Spent time explaining how oral and IV  medications work and their time to effectiveness and how quickly they wear off.  Also discussed with patient expectations regarding pain noting that 0 pain is incredibly difficult to achieve so would hope patient's pain could be more tolerable so that he could be more functional.  Spent time answering questions regarding medication management.  Patient agreement with plan.  Spent time providing emotional support via active listening.  Noted palliative medicine team continue to follow along with patient's medical journey.  Discussed care with surgical team.   Primary Diagnoses  Present on Admission:  Anemia due to acute blood loss   Palliative Review of Systems: Sacral/hip pain  Past Medical History:  Diagnosis Date   Anemia 02/2021   iron   Anxiety    Asthma    Bilateral pulmonary embolism (HCC) 04/24/2019   Dehiscence of anastomosis of large intestine 12/31/2022   Depression    Diabetes mellitus without complication (HCC)    History of kidney stones    HLD (hyperlipidemia)    Hypertension    Kidney stones    Neuromuscular disorder (HCC)    from radiation bi lat   OSA (obstructive sleep apnea)    CPAP   Peripheral vascular disease (HCC)    DVT   rectal ca 02/2021   Sleep apnea    Social History   Socioeconomic History   Marital status: Married    Spouse name: Not on file   Number of children: 3   Years of education: Not on file   Highest education level: Not on file  Occupational History   Occupation: Systems analyst  Tobacco Use   Smoking status: Never   Smokeless tobacco: Never  Vaping Use   Vaping status: Never Used  Substance and Sexual Activity   Alcohol use: Yes    Comment: 1 beer every 3-4 months   Drug use: Never   Sexual activity: Yes  Other Topics Concern   Not on file  Social History Narrative   Not on file   Social Drivers of Health   Financial Resource Strain: Not on file  Food Insecurity: No Food Insecurity (12/26/2023)   Hunger Vital  Sign    Worried About Running Out of Food in the Last Year: Never true    Ran Out of Food in the Last Year: Never true  Transportation Needs: No Transportation Needs (12/26/2023)   PRAPARE - Administrator, Civil Service (Medical): No    Lack of Transportation (Non-Medical): No  Physical Activity: Not on file  Stress: Not on file  Social Connections: Socially Integrated (11/30/2023)   Social Connection and Isolation Panel [NHANES]    Frequency of Communication with Friends and Family: More than three times a week    Frequency of Social Gatherings with Friends and Family: Three times a week    Attends Religious Services: More than 4 times per year    Active Member of Clubs or Organizations: Yes    Attends Banker Meetings: More than 4 times per year    Marital Status: Married   Family History  Problem Relation Age of Onset   Diabetes Mellitus II Mother  Breast cancer Mother 76   Thrombophlebitis Father    Breast cancer Maternal Grandmother 69   Diabetes Maternal Grandmother    Breast cancer Maternal Aunt 60   Diabetes Maternal Aunt    Hypertension Paternal Grandmother    Colon cancer Neg Hx    Esophageal cancer Neg Hx    Inflammatory bowel disease Neg Hx    Liver disease Neg Hx    Pancreatic cancer Neg Hx    Rectal cancer Neg Hx    Stomach cancer Neg Hx    Scheduled Meds:  sodium chloride   Intravenous Once   acetaminophen  1,000 mg Oral Q6H   carbamide peroxide  5 drop Both EARS BID   Chlorhexidine Gluconate Cloth  6 each Topical Q2200   insulin aspart  0-20 Units Subcutaneous Q8H   magic mouthwash w/lidocaine  10 mL Oral TID   methocarbamol (ROBAXIN) injection  500 mg Intravenous Q6H   nutrition supplement (JUVEN)  1 packet Oral BID BM   sodium chloride flush  10-40 mL Intracatheter Q12H   Continuous Infusions:  piperacillin-tazobactam (ZOSYN)  IV Stopped (01/05/24 1044)   TPN ADULT (ION) 85 mL/hr at 01/05/24 0900   TPN ADULT (ION)     PRN  Meds:.albuterol, ALPRAZolam, calcium carbonate, diphenhydrAMINE **OR** diphenhydrAMINE, HYDROmorphone (DILAUDID) injection, naphazoline-glycerin, ondansetron **OR** ondansetron (ZOFRAN) IV, mouth rinse, oxyCODONE, sodium chloride flush Allergies  Allergen Reactions   Contrast Media [Iodinated Contrast Media] Hives and Nausea Only    As a 53 year old experienced hives and nausea   CBC:    Component Value Date/Time   WBC 8.3 01/05/2024 1012   HGB 6.9 (LL) 01/05/2024 1012   HGB 11.7 (L) 12/05/2021 0956   HCT 21.7 (L) 01/05/2024 1012   PLT 412 (H) 01/05/2024 1012   PLT 274 12/05/2021 0956   MCV 84.4 01/05/2024 1012   NEUTROABS 8.8 (H) 01/02/2024 0517   LYMPHSABS 1.3 01/02/2024 0517   MONOABS 0.6 01/02/2024 0517   EOSABS 0.0 01/02/2024 0517   BASOSABS 0.0 01/02/2024 0517   Comprehensive Metabolic Panel:    Component Value Date/Time   NA 131 (L) 01/04/2024 0425   K 4.3 01/04/2024 0425   CL 100 01/04/2024 0425   CO2 23 01/04/2024 0425   BUN 20 01/04/2024 0425   CREATININE 0.54 (L) 01/04/2024 0425   CREATININE 1.09 10/27/2023 1014   GLUCOSE 151 (H) 01/04/2024 0425   CALCIUM 7.7 (L) 01/04/2024 0425   AST 11 (L) 01/04/2024 0425   AST 11 (L) 10/27/2023 1014   ALT 9 01/04/2024 0425   ALT 11 10/27/2023 1014   ALKPHOS 113 01/04/2024 0425   BILITOT 0.6 01/04/2024 0425   BILITOT 0.5 10/27/2023 1014   PROT 5.9 (L) 01/04/2024 0425   ALBUMIN <1.5 (L) 01/04/2024 0425    Physical Exam: Vital Signs: BP (!) 107/53   Pulse 92   Temp 98.2 F (36.8 C) (Oral)   Resp 12   Ht 5\' 10"  (1.778 m)   Wt 78.5 kg   SpO2 99%   BMI 24.83 kg/m  SpO2: SpO2: 99 % O2 Device: O2 Device: Room Air O2 Flow Rate: O2 Flow Rate (L/min): 2 L/min Intake/output summary:  Intake/Output Summary (Last 24 hours) at 01/05/2024 1240 Last data filed at 01/05/2024 0900 Gross per 24 hour  Intake 2562.98 ml  Output 2400 ml  Net 162.98 ml   LBM: Last BM Date : 01/04/24 Baseline Weight: Weight: 85.3 kg Most recent  weight: Weight: 78.5 kg  General: NAD, alert, pleasant  Cardiovascular: RRR, no edema in LE b/l Respiratory: no increased work of breathing noted, not in respiratory distress Neuro: A&Ox4, following commands easily Psych: appropriately answers all questions          Palliative Performance Scale: 30%               Additional Data Reviewed: Recent Labs    01/03/24 0525 01/03/24 0849 01/04/24 0425 01/05/24 0505 01/05/24 1012  WBC  --   --   --  8.2 8.3  HGB  --    < >  --  7.3* 6.9*  PLT  --   --   --  424* 412*  NA 134*  --  131*  --   --   BUN 20  --  20  --   --   CREATININE 0.36*  --  0.54*  --   --    < > = values in this interval not displayed.    Imaging: [place] EKG SITE RITE If Site Rite image not attached, placement could not be confirmed due to  current cardiac rhythm.    I personally reviewed recent imaging.   Palliative Care Assessment and Plan Summary of Established Goals of Care and Medical Treatment Preferences   Patient is a 53 year old male with a past medical history of diabetes mellitus type 2, VTE on Xarelto, stage II rectal adenocarcinoma status post neoadjuvant concurrent chemoradiation in 2022 followed by a low anterior resection with a complicated postoperative course including need for ostomy with chronic pelvic abscess who was admitted on 12/25/2023 under general surgery service for management of perirectal wound bleeding.  During hospitalization, patient has required multiple transfusions for acute blood loss anemia secondary to perineal wound.  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  Palliative medicine team consulted to assist with pain management.  # Symptom management Patient is receiving these palliative interventions for symptom management with an intent to improve quality of life.   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 Based on OMEs calculated for this dose, which would be approximately 81 OMEs when reducing by 50% for incomplete cross tolerance, will appropriately start patient on the listed regimen below.    - Stop Norco   - Start MS Contin 15 mg every 12 hours.  Starting low-dose due to patient's sensitivities regarding opioids.  Will likely need increase.   - Start oxycodone 5-10 mg every 4 hours as needed   - Change IV hydromorphone to 2 mg every 4 hours as needed breakthrough pain   - Change Tylenol to 1000 mg tab every 8 hours   - Anxiety   - Start Ativan 0.5 mg every 6 hours as needed   - Stop Xanax  # Psycho-social/Spiritual Support:  - Support System: Wife  # Discharge Planning:  To Be Determined  Thank you for allowing the palliative care team to participate in the care [name].  Alvester Morin, DO Palliative Care Provider PMT # [phone]  If patient remains symptomatic despite maximum doses, please call PMT at [phone] between 0700 and 1900. Outside of these hours, please call attending, as PMT does not have night coverage.

## 2024-01-05 NOTE — Progress Notes (Signed)
 PHARMACY - TOTAL PARENTERAL NUTRITION CONSULT NOTE   Indication: EC Fistula   Patient Measurements: Height: 5\' 10"  (177.8 cm) Weight: 78.5 kg (173 lb 1 oz) IBW/kg (Calculated) : 73 TPN AdjBW (KG): 81.5 Body mass index is 24.83 kg/m. Usual Weight:   Assessment:  52 yoM w/ hx DM2, PE on Xarelto, stage II rectal cancer s/p chemoradiation. Underwent low anterior & abdominoperineal resections with ileostomy in 2022, redo LAR in 2024, ultimately end colostomy placement March 2025; admitted 3/28 for sacrococcygeal ulceration with necrosis & bleeding. Prior to admission, patient did not have issues tolerating solid foods and was able to take some solid food while on soft diet inpatient. Notable weight loss of 7% over 1 month. Pharmacy consulted to manage TPN for nutrition support on 4/2 (admission day 6) for EC fistula. While fistula output is not high, has chronic pelvic abscess with rectal stump disruption and ongoing leaking. Plan to return to OR for wound vac changes over next few days.  4/8 recalculated TPN goal rate per RD recommendation, see below for macros  Glucose / Insulin: T2DM, A1c 7.5; CBGs 128 - 172 with 14u SSI in past 24h + 80u insulin regular in TPN Electrolytes: 4/7: Na 131 with max Na in TPN, Mag 1.7, phos 3 Renal: BUN WNL; SCr stable WNL Hepatic: 4/7 Albumin <1.5, LFTs/Tbili/TG WNL I/O: 0.9 mL/kg/h UOP (1750 mL), 800 drain output, 20 mL stool  GI Imaging:  4/1 CT A/P: combination of oral contrast and air between bowel and sacrococcygeal wound suggesting combination of perforation/fistulization and necrotic infection GI Surgeries / Procedures:  3/28 EUA for bleeding from perineal wound 4/2: debridement and wound vac placement 4/4  wound exploration, VAC dressing change 4/7 repeat dressing change  Central access: PICC ordered for TPN 4/2 TPN start date: 12/30/2023  Nutritional Goals: Goal TPN rate is 85 mL/hr (provides 108 g of protein and 2085 kcals per day) 4/8 Updated  goal rate 90 mL/h provides 123 g protein and 2132 kcal   RD Assessment: Last updated 4/8 Estimated Needs Total Energy Estimated Needs: 2100-2350 kcals Total Protein Estimated Needs: 120-135 grams Total Fluid Estimated Needs: >/= 2.1L   Current Nutrition:  TPN + CLD (4/7) 4/6 resource breeze TID per primary. Per RN, patient refusing due to sugar content - trial of Juven  Plan:  Increase TPN to recalculated goal rate of 90 mL/hr;  Electrolytes in TPN: Na 150 mEq/L, small decrease in K concentration to 45 mEq/L, Ca 5 mEq/L, increase Mg to 8 mEq/L, Phos 15 mmol/L, Cl:Ac ratio - 1:1 Add standard MVI and trace elements to TPN Chromium on hold d/t national shortage Continue SSI resistant scale, adjust to q8h checks and increase to 87 units insulin in TPN  Monitor TPN labs on Mon/Thurs, PRN; next labs 4/10 AM  Rutherford Nail, PharmD PGY2 Critical Care Pharmacy Resident 01/05/2024 8:56 AM

## 2024-01-05 NOTE — Telephone Encounter (Signed)
 Patient Product/process development scientist completed.    The patient is insured through [employer]. Patient has [employer], may use a copay card, and/or apply for patient assistance if available.    Ran test claim for fentanyl (Duragesic) 25 mcg/hr and the current 30 day co-pay is $10.00.   This test claim was processed through [facility]- copay amounts may vary at other pharmacies due to pharmacy/plan contracts, or as the patient moves through the different stages of their insurance plan.     Roland Earl, CPHT Pharmacy Technician III Certified Patient Advocate [facility] Patient Advocate Team Direct Number: [phone]  Fax: [phone]

## 2024-01-05 NOTE — Progress Notes (Signed)
 PHARMACY - TOTAL PARENTERAL NUTRITION CONSULT NOTE   Indication: EC Fistula   Patient Measurements: Height: 5\' 10"  (177.8 cm) Weight: 78.5 kg (173 lb 1 oz) IBW/kg (Calculated) : 73 TPN AdjBW (KG): 81.5 Body mass index is 24.83 kg/m. Usual Weight:   Assessment:  Steven [name] w/ hx DM2, PE on Xarelto, stage II rectal cancer s/p chemoradiation. Underwent low anterior & abdominoperineal resections with ileostomy in 2022, redo LAR in 2024, ultimately end colostomy placement March 2025; admitted 3/28 for sacrococcygeal ulceration with necrosis & bleeding. Prior to admission, patient did not have issues tolerating solid foods and was able to take some solid food while on soft diet inpatient. Notable weight loss of 7% over 1 month. Pharmacy consulted to manage TPN for nutrition support on 4/2 (admission day 6) for EC fistula. While fistula output is not high, has chronic pelvic abscess with rectal stump disruption and ongoing leaking. Plan to return to OR for wound vac changes over next few days.  4/8 recalculated TPN goal rate per RD recommendation, see below for macros  Glucose / Insulin: T2DM, A1c 7.5; CBGs 128 - 172 with 14u SSI in past 24h + 80u insulin regular in TPN Electrolytes: 4/7: Na 131 with max Na in TPN, Mag 1.7, phos 3 Renal: BUN WNL; SCr stable WNL Hepatic: 4/7 Albumin <1.5, LFTs/Tbili/TG WNL I/O: 0.9 mL/kg/h UOP (1750 mL), 800 drain output, 20 mL stool  GI Imaging:  4/1 CT A/P: combination of oral contrast and air between bowel and sacrococcygeal wound suggesting combination of perforation/fistulization and necrotic infection GI Surgeries / Procedures:  3/28 EUA for bleeding from perineal wound 4/2: debridement and wound vac placement 4/4  wound exploration, VAC dressing change 4/7 repeat dressing change  Central access: PICC ordered for TPN 4/2 TPN start date: 12/30/2023  Nutritional Goals: Goal TPN rate is 85 mL/hr (provides 108 g of protein and 2085 kcals per day) 4/8 Updated  goal rate 90 mL/h provides 123 g protein and 2132 kcal   RD Assessment: Last updated 4/8 Estimated Needs Total Energy Estimated Needs: 2100-2350 kcals Total Protein Estimated Needs: 120-135 grams Total Fluid Estimated Needs: >/= 2.1L   Current Nutrition:  TPN + CLD (4/7) 4/6 resource breeze TID per primary. Per RN, patient refusing due to sugar content - trial of Juven  Plan:  Increase TPN to recalculated goal rate of 90 mL/hr;  Electrolytes in TPN: Na 150 mEq/L, small decrease in K concentration to 45 mEq/L, Ca 5 mEq/L, increase Mg to 8 mEq/L, Phos 15 mmol/L, Cl:Ac ratio - 1:1 Add standard MVI and trace elements to TPN Chromium on hold d/t national shortage Continue SSI resistant scale, adjust to q8h checks and increase to 87 units insulin in TPN  Monitor TPN labs on Mon/Thurs, PRN; next labs 4/10 AM  ADDENDUM 18:40: Insulin was not added to TPN bag as planned above. Ordered insulin glargine 40 units BID x2 doses and increased resistant SSI to Q4 hrs. Explained situation to RN and patient, who was understanding.    Alphia Moh, PharmD, BCPS, BCCP Clinical Pharmacist  Please check AMION for all [facility] phone numbers After 10:00 PM, call Main Pharmacy [phone]

## 2024-01-05 NOTE — Plan of Care (Signed)
  Problem: Fluid Volume: Goal: Ability to maintain a balanced intake and output will improve Outcome: Progressing   Problem: Metabolic: Goal: Ability to maintain appropriate glucose levels will improve Outcome: Progressing   Problem: Nutritional: Goal: Maintenance of adequate nutrition will improve Outcome: Progressing   Problem: Clinical Measurements: Goal: Respiratory complications will improve Outcome: Progressing Goal: Cardiovascular complication will be avoided Outcome: Progressing   Problem: Safety: Goal: Ability to remain free from injury will improve Outcome: Progressing   Problem: Activity: Goal: Risk for activity intolerance will decrease Outcome: Not Progressing

## 2024-01-05 NOTE — Plan of Care (Signed)
  Problem: Education: Goal: Ability to describe self-care measures that may prevent or decrease complications (Diabetes Survival Skills Education) will improve Outcome: Progressing Goal: Individualized Educational Video(s) Outcome: Progressing   Problem: Coping: Goal: Ability to adjust to condition or change in health will improve Outcome: Progressing   Problem: Fluid Volume: Goal: Ability to maintain a balanced intake and output will improve Outcome: Progressing   Problem: Health Behavior/Discharge Planning: Goal: Ability to identify and utilize available resources and services will improve Outcome: Progressing   Problem: Nutritional: Goal: Maintenance of adequate nutrition will improve Outcome: Progressing   Problem: Education: Goal: Knowledge of General Education information will improve Description: Including pain rating scale, medication(s)/side effects and non-pharmacologic comfort measures Outcome: Progressing   Problem: Health Behavior/Discharge Planning: Goal: Ability to manage health-related needs will improve Outcome: Progressing

## 2024-01-05 NOTE — Progress Notes (Signed)
   01/05/24 1508  Spiritual Encounters  Type of Visit Attempt (pt unavailable)  Reason for visit Routine spiritual support  OnCall Visit No   Attempted visit, patient was sleeping. Chaplain will attempt again.

## 2024-01-06 DIAGNOSIS — D62 Acute posthemorrhagic anemia: Secondary | ICD-10-CM | POA: Diagnosis not present

## 2024-01-06 DIAGNOSIS — R52 Pain, unspecified: Secondary | ICD-10-CM

## 2024-01-06 DIAGNOSIS — T8130XA Disruption of wound, unspecified, initial encounter: Secondary | ICD-10-CM

## 2024-01-06 LAB — CBC
HCT: 26.5 % — ABNORMAL LOW (ref 39.0–52.0)
Hemoglobin: 8.4 g/dL — ABNORMAL LOW (ref 13.0–17.0)
MCH: 29.6 pg (ref 26.0–34.0)
MCHC: 31.7 g/dL (ref 30.0–36.0)
MCV: 93.3 fL (ref 80.0–100.0)
Platelets: 432 10*3/uL — ABNORMAL HIGH (ref 150–400)
RBC: 2.84 MIL/uL — ABNORMAL LOW (ref 4.22–5.81)
RDW: 19.9 % — ABNORMAL HIGH (ref 11.5–15.5)
WBC: 7.4 10*3/uL (ref 4.0–10.5)
nRBC: 0 % (ref 0.0–0.2)

## 2024-01-06 LAB — BPAM RBC
Blood Product Expiration Date: 202505062359
Blood Product Expiration Date: 202505062359
ISSUE DATE / TIME: 202504081522
ISSUE DATE / TIME: 202504081801
Unit Type and Rh: 6200
Unit Type and Rh: 6200

## 2024-01-06 LAB — TYPE AND SCREEN
ABO/RH(D): A POS
Antibody Screen: NEGATIVE
Unit division: 0
Unit division: 0

## 2024-01-06 LAB — GLUCOSE, CAPILLARY
Glucose-Capillary: 103 mg/dL — ABNORMAL HIGH (ref 70–99)
Glucose-Capillary: 140 mg/dL — ABNORMAL HIGH (ref 70–99)
Glucose-Capillary: 151 mg/dL — ABNORMAL HIGH (ref 70–99)
Glucose-Capillary: 162 mg/dL — ABNORMAL HIGH (ref 70–99)
Glucose-Capillary: 171 mg/dL — ABNORMAL HIGH (ref 70–99)
Glucose-Capillary: 174 mg/dL — ABNORMAL HIGH (ref 70–99)
Glucose-Capillary: 91 mg/dL (ref 70–99)

## 2024-01-06 LAB — HEMOGLOBIN AND HEMATOCRIT, BLOOD
HCT: 26.7 % — ABNORMAL LOW (ref 39.0–52.0)
Hemoglobin: 8.5 g/dL — ABNORMAL LOW (ref 13.0–17.0)

## 2024-01-06 LAB — TRANSFUSION REACTION
DAT C3: NEGATIVE
Post RXN DAT IgG: NEGATIVE

## 2024-01-06 MED ORDER — INSULIN ASPART 100 UNIT/ML IJ SOLN
0.0000 [IU] | INTRAMUSCULAR | Status: AC
  Administered 2024-01-06 (×2): 3 [IU] via SUBCUTANEOUS

## 2024-01-06 MED ORDER — MORPHINE SULFATE ER 15 MG PO TBCR
15.0000 mg | EXTENDED_RELEASE_TABLET | Freq: Three times a day (TID) | ORAL | Status: DC
  Administered 2024-01-06 – 2024-01-09 (×8): 15 mg via ORAL
  Filled 2024-01-06 (×8): qty 1

## 2024-01-06 MED ORDER — NYSTATIN 100000 UNIT/GM EX POWD
Freq: Two times a day (BID) | CUTANEOUS | Status: DC
  Filled 2024-01-06: qty 15

## 2024-01-06 MED ORDER — FAT EMUL FISH OIL/PLANT BASED 20% (SMOFLIPID)IV EMUL
INTRAVENOUS | Status: AC
  Filled 2024-01-06: qty 1231.2

## 2024-01-06 MED ORDER — INSULIN ASPART 100 UNIT/ML IJ SOLN: 0.0000 [IU] | Freq: Three times a day (TID) | INTRAMUSCULAR | Status: DC

## 2024-01-06 MED ORDER — OXYCODONE HCL 5 MG PO TABS
10.0000 mg | ORAL_TABLET | ORAL | Status: DC | PRN
  Administered 2024-01-06 (×2): 15 mg via ORAL
  Administered 2024-01-07: 10 mg via ORAL
  Administered 2024-01-07 (×2): 15 mg via ORAL
  Administered 2024-01-08: 10 mg via ORAL
  Filled 2024-01-06 (×4): qty 3
  Filled 2024-01-06 (×2): qty 2

## 2024-01-06 NOTE — Progress Notes (Signed)
 PHARMACY - TOTAL PARENTERAL NUTRITION CONSULT NOTE   Indication: EC Fistula   Patient Measurements: Height: 5\' 10"  (177.8 cm) Weight: 80.2 kg (176 lb 12.9 oz) IBW/kg (Calculated) : 73 TPN AdjBW (KG): 81.5 Body mass index is 25.37 kg/m. Usual Weight:   Assessment:  52 yoM w/ hx DM2, PE on Xarelto, stage II rectal cancer s/p chemoradiation. Underwent low anterior & abdominoperineal resections with ileostomy in 2022, redo LAR in 2024, ultimately end colostomy placement March 2025; admitted 3/28 for sacrococcygeal ulceration with necrosis & bleeding. Prior to admission, patient did not have issues tolerating solid foods and was able to take some solid food while on soft diet inpatient. Notable weight loss of 7% over 1 month. Pharmacy consulted to manage TPN for nutrition support on 4/2 (admission day 6) for EC fistula. While fistula output is not high, has chronic pelvic abscess with rectal stump disruption and ongoing leaking. Plan to return to OR for wound vac changes over next few days.  4/8 recalculated TPN goal rate per RD recommendation, see below for macros  Glucose / Insulin: T2DM, A1c 7.5; CBGs 117 - 140 with Semglee 40 and 13u SSI 4/9 Planned for 87 units in TPN however failed to add upon re-order with adjusted kcal/protein goals.  Electrolytes: 4/7: Na 131 with max Na in TPN, Mag 1.7, phos 3 Renal: 4/7 BUN WNL; SCr stable WNL Hepatic: 4/7 Albumin <1.5, LFTs/Tbili/TG WNL I/O: 0.5 mL/kg/h UOP, 300 mL drain output, 20 mL stool in ostomy  GI Imaging:  4/1 CT A/P: combination of oral contrast and air between bowel and sacrococcygeal wound suggesting combination of perforation/fistulization and necrotic infection GI Surgeries / Procedures:  3/28 EUA for bleeding from perineal wound 4/2: debridement and wound vac placement 4/4  wound exploration, VAC dressing change 4/7 repeat dressing change  Central access: PICC ordered for TPN 4/2 TPN start date: 12/30/2023  Nutritional  Goals: Initial goal rate 85 mL/hr (provides 108 g of protein and 2085 kcals per day) 4/8 Updated goal rate 90 mL/h provides 123 g protein and 2132 kcal   RD Assessment: Last updated 4/8 Estimated Needs Total Energy Estimated Needs: 2100-2350 kcals Total Protein Estimated Needs: 120-135 grams Total Fluid Estimated Needs: >/= 2.1L   Current Nutrition:  TPN + CLD (4/7) 4/6 resource breeze TID; Per RN, patient refused due to sugar content 4/9 Did not tolerate trial of Juven due to taste  Plan:  Continue TPN at recalculated goal rate of 90 mL/hr;  Electrolytes in TPN: Na 150 mEq/L, K 45 mEq/L, Ca 5 mEq/L, Mg to 8 mEq/L, Phos 15 mmol/L, Cl:Ac ratio - 1:1 Add standard MVI and trace elements to TPN Chromium on hold d/t national shortage Deescalate to SSI moderate scale, q8h checks after new TPN hung Add 85 units insulin back in TPN  Monitor TPN labs on Mon/Thurs, PRN; next labs 4/10 AM  Rutherford Nail, PharmD PGY2 Critical Care Pharmacy Resident 01/06/2024 9:04 AM

## 2024-01-06 NOTE — Progress Notes (Signed)
 PROGRESS NOTE  Steven [name]  BMW:[id number] DOB: 1971/02/16 DOA: 12/25/2023 PCP: Noberto Retort, MD   Brief Narrative: Patient is a 53 year old male with history of diabetes type 2, VTE on Xarelto, stage II rectal adenocarcinoma status post neoadjuvant concurrent chemoradiation in 2022 followed by low anterior resection complicated postoperative course with a chronic pelvic abscess, was admitted under general surgery service after he presented with perirectal wound bleeding.  Found to have acute blood loss anemia.  We were consulted for medical management, primarily  for diabetes.  He was recently admitted by general surgery service and was discharged home on 12/17/2023 after surgical debridement of his perineal wound.  Patient has been transfused with PRBCs during this hospitalization.  General surgery is the primary team.  Underwent excisional debridement, irrigation of sacral wound and application of wound VAC on 4/2.Underwent repeat debridement on 4/7.  Hospital course remarkable for persistent pain on the perineal area.  Plan for exchange  of wound VAC tomorrow  Assessment & Plan:  Principal Problem:   Anemia due to acute blood loss Active Problems:   Post-operative hemoglobin drop   High risk medication use   Need for emotional support   Counseling and coordination of care   Medication management   Palliative care encounter   Acute blood loss anemia/perineal wound: Secondary to bleeding from perineal wound.   Total of 7 units of blood transfusion during this hospitalization.  Continue to monitor H&H.  Last Hemoglobin in the range of 8  Stage II rectal adenocarcinoma:Status post neoadjuvant concurrent chemoradiation in 2022 followed by low anterior resection complicated postoperative course with a chronic pelvic abscess, was admitted under general surgery service after he presented with perirectal wound bleeding.  Started on TPN.currently on clear liquid diet.  Also has  ostomy.Underwent excisional debridement, irrigation of sacral wound and application of wound VAC on 4/2.Underwent repeat debridement on 4/7.  Plan for wound VAC exam tomorrow  Diabetes type 2: He has been  on sliding scale only.   diabetic coordinator consulted.  Last A1c in the range of 7.5.  A1c of 10.3 about a month ago.  Monitor blood sugars,stable now  History of VTE: On Xarelto, currently on hold  Leukocytosis: Improving.  Currently on Zosyn.  Cultures have been negative so far. UA done on 4/1 showed some WBC but patient is already on Zosyn.   Nutrition Problem: Increased nutrient needs Etiology: wound healing    DVT prophylaxis:SCDs Start: 12/25/23 0912     Code Status: Full Code   Antimicrobials:  Anti-infectives (From admission, onward)    Start     Dose/Rate Route Frequency Ordered Stop   01/04/24 1400  piperacillin-tazobactam (ZOSYN) IVPB 3.375 g       Note to Pharmacy: Pharmacy to check dosing >> approved by [facility]   3.375 g 12.5 mL/hr over 240 Minutes Intravenous Every 8 hours 01/04/24 1257     12/25/23 1800  piperacillin-tazobactam (ZOSYN) IVPB 3.375 g  Status:  Discontinued       Note to Pharmacy: Pharmacy to check dosing >> approved by [facility]   3.375 g 12.5 mL/hr over 240 Minutes Intravenous Every 8 hours 12/25/23 1713 01/04/24 1257       Subjective: Patient seen and examined at the bedside today.  Overall comfortable.  Denies any significant pain or pain today.  No new issues  Objective: Vitals:   01/06/24 0730 01/06/24 0800 01/06/24 0900 01/06/24 1000  BP:  (!) 121/53 (!) 140/67 127/63  Pulse:  91 99 97  Resp:  14 16 16   Temp: 98 F (36.7 C)     TempSrc: Oral     SpO2:  97% 94% 96%  Weight:      Height:        Intake/Output Summary (Last 24 hours) at 01/06/2024 1047 Last data filed at 01/06/2024 1015 Gross per 24 hour  Intake 3171.87 ml  Output 3950 ml  Net -778.13 ml   Filed Weights   12/31/23 0300 01/01/24 0500 01/06/24 0500  Weight: 79.2 kg  78.5 kg 80.2 kg    Examination:   General exam: Overall comfortable, not in distress HEENT: PERRL Respiratory system:  no wheezes or crackles  Cardiovascular system: S1 & S2 heard, RRR.  Gastrointestinal system: Abdomen is nondistended, soft and nontender.Ostomy Central nervous system: Alert and oriented Extremities: No edema, no clubbing ,no cyanosis,picc line Skin: Perineal wound   Data Reviewed: I have personally reviewed following labs and imaging studies  CBC: Recent Labs  Lab 01/01/24 0525 01/01/24 1332 01/02/24 0517 01/03/24 0849 01/05/24 0505 01/05/24 1012 01/06/24 0034 01/06/24 0500  WBC 11.1*  --  10.8*  --  8.2 8.3  --  7.4  NEUTROABS  --   --  8.8*  --   --   --   --   --   HGB 6.5*   < > 7.8* 8.6* 7.3* 6.9* 8.5* 8.4*  HCT 20.3*   < > 24.2* 27.0* 22.8* 21.7* 26.7* 26.5*  MCV 82.9  --  84.3  --  91.6 84.4  --  93.3  PLT 462*  --  444*  --  424* 412*  --  432*   < > = values in this interval not displayed.   Basic Metabolic Panel: Recent Labs  Lab 12/31/23 0617 01/01/24 0525 01/01/24 1332 01/01/24 1423 01/02/24 0514 01/03/24 0525 01/04/24 0425  NA 131* 135 134* 135 134* 134* 131*  K 4.2 3.9 6.2* 4.3 4.1 4.3 4.3  CL 98 101 101 101 103 101 100  CO2 23 26  --   --  26 25 23   GLUCOSE 257* 145* 667* 193* 207* 116* 151*  BUN 29* 25* 20 19 20 20 20   CREATININE 0.85 0.70 0.80 0.70 0.56* 0.36* 0.54*  CALCIUM 7.7* 7.7*  --   --  7.8* 7.8* 7.7*  MG 2.1  --   --   --  1.9  --  1.7  PHOS 3.1  --   --   --  2.8  --  3.0     No results found for this or any previous visit (from the past 240 hours).    Radiology Studies: No results found.   Scheduled Meds:  acetaminophen  1,000 mg Oral Q8H   carbamide peroxide  5 drop Both EARS BID   Chlorhexidine Gluconate Cloth  6 each Topical Q2200   insulin aspart  0-20 Units Subcutaneous Q4H   magic mouthwash w/lidocaine  10 mL Oral TID   methocarbamol (ROBAXIN) injection  500 mg Intravenous Q6H   morphine  15  mg Oral Q8H   nutrition supplement (JUVEN)  1 packet Oral BID BM   nystatin   Topical BID   sodium chloride flush  10-40 mL Intracatheter Q12H   Continuous Infusions:  piperacillin-tazobactam (ZOSYN)  IV Stopped (01/06/24 1019)   TPN ADULT (ION) 90 mL/hr at 01/06/24 1015     LOS: 12 days   Burnadette Pop, MD [facility] P4/05/2024, 10:47 AM

## 2024-01-06 NOTE — Progress Notes (Signed)
 Daily Progress Note   Patient Name: Steven [name]       Date: 01/06/2024 DOB: [birth year]  Age: 53 y.o. MRN#: [id number] Attending Physician: Steven Limbo, MD Primary Care Physician: Steven Retort, MD Admit Date: 12/25/2023 Length of Stay: 12 days  Reason for Consultation/Follow-up: Pain control  Subjective:   CC: Patient still having pain though adjustment of medication is assisting with management.  Following up regarding pain control.  Subjective:  Reviewed EMR prior to presenting to bedside.  At time of EMR review in past 24 hours patient has received as needed IV Dilaudid 2 mg x 5 doses and as needed oxycodone 10 mg x 4 doses.  Patient was started on MS Contin 15 mg every 12 hours in the evening on 01/05/2024.  Patient also received as needed Ativan IV 0.5 mg x 1 dose.  Upon review of EMR, patient had difficult night due to his wound VAC malfunctioning and leakage around his condom cath.  Surgery team saw patient this morning and plan is to exchange wound VAC tomorrow in the OR.  Discussed care with bedside RN for updates.  Presented to bedside to see patient.  Patient's wife present at bedside as well.  Reviewed patient's pain management at this time.  Patient does feel that medication adjustments are assisting with his overall pain though still room for improvement.  Patient discussed his difficult night and how that worsened his pain.  Patient does feel that the as needed IV Dilaudid at the 2 mg dose is assisting with pain management more appropriately when he receives it.  Patient still having acute pain with dressing changes.  Discussed based on patient's OME requirements, would recommend continuing to increase patient's long-acting medication.  Discussed increasing patient to MS Contin 15 mg every 8 hours during the day.  Patient agreement this plan.  Patient also received as needed oxycodone and feels that the 10 mg dose does help though at times this worsens so noted could increase range  available to as needed oxycodone 10-15 mg every 4 hours as needed.  Patient agreement with this plan. Discussed patient can continue to receive as needed Ativan as needed for anxiety particularly related to movement and wound care.  Spent time providing emotional support via active listening.  All questions answered at that time.  Noted palliative medicine team to continue following patient's medical journey.   Objective:   Vital Signs:  BP (!) 106/50   Pulse 92   Temp 98 F (36.7 C) (Oral)   Resp (!) 26   Ht 5\' 10"  (1.778 m)   Wt 80.2 kg   SpO2 95%   BMI 25.37 kg/m   Physical Exam: General: NAD, alert, pleasant Cardiovascular: RRR, no edema in LE b/l Respiratory: no increased work of breathing noted, not in respiratory distress Neuro: A&Ox4, following commands easily Psych: appropriately answers all questions  Imaging: I personally reviewed recent imaging.   Assessment & Plan:   Assessment: Patient is a 53 year old male with a past medical history of diabetes mellitus type 2, VTE on Xarelto, stage II rectal adenocarcinoma status post neoadjuvant concurrent chemoradiation in 2022 followed by a low anterior resection with a complicated postoperative course including need for ostomy with chronic pelvic abscess who was admitted on 12/25/2023 under general surgery service for management of perirectal wound bleeding. During hospitalization, patient has required multiple transfusions for acute blood loss anemia secondary to perineal wound. 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 Palliative medicine team consulted to assist with pain management.   Recommendations/Plan: # Symptom management: Patient is receiving these palliative interventions for symptom management with an intent to improve quality of life.                 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 Based on OMEs calculated for this dose, when reducing by 50% for incomplete cross tolerance, will appropriately start patient on the listed regimen below.                                - Increase MS Contin 15 mg every 8 hours during the day.  Will continue to increase slowly as tolerated by patient.                               - Increase oxycodone 10-15 mg every 4 hours as needed                               - Continue IV hydromorphone to 2 mg every 4 hours as needed breakthrough pain                               - Continue Tylenol to 1000 mg tab every 8 hours                  - Anxiety                               - Continue IV Ativan 0.5 mg every 6 hours as needed   # Psycho-social/Spiritual Support:  - Support System: Wife  # Discharge Planning: To Be Determined  Discussed with: Patient, patient's wife, RN  Thank you for allowing the palliative care team to participate in the care Steven [name].  Steven Morin, DO Palliative Care Provider PMT # [phone]  If patient remains symptomatic despite maximum doses, please call PMT at [phone] between 0700 and 1900. Outside of these hours, please call attending, as PMT does not have night coverage.

## 2024-01-06 NOTE — Progress Notes (Signed)
 2 Days Post-Op   Subjective/Chief Complaint: Had a difficult night with his vac malfunctioning and issues with condom cath. After trouble shooting, offloading pressure seemed to keep it working consistently. Functioning this am. Pain control seems better with adjustments - he is having new pain in his neck from sleeping positions.    Objective: Vital signs in last 24 hours: Temp:  [97.8 F (36.6 C)-99.3 F (37.4 C)] 98 F (36.7 C) (04/09 0730) Pulse Rate:  [85-105] 92 (04/09 0100) Resp:  [9-26] 26 (04/09 0100) BP: (101-145)/(47-81) 106/50 (04/09 0100) SpO2:  [92 %-100 %] 95 % (04/09 0100) Weight:  [80.2 kg] 80.2 kg (04/09 0500) Last BM Date : 01/04/24  Intake/Output from previous day: 04/08 0701 - 04/09 0700 In: 2832.1 [I.V.:1884.1; Blood:805; IV Piggyback:143] Out: 1370 [Urine:1050; Drains:300; Stool:20] Intake/Output this shift: No intake/output data recorded.  General appearance: alert, cooperative, and no distress Resp: breathing comfortably Cardio: regular rate and rhythm GI: soft, non distended, ostomy with gas and small amount of liquid stool. Right lateral abdomen tracking from under colostomy device at 9 o clock there is an area of erythema and fluctuance. Vac cannister with bloody output. Lower abdominal wall within crease there is an erythematous rash related to moisture MSK: bilateral feet edematous  Lab Results:  Recent Labs    01/05/24 1012 01/06/24 0034 01/06/24 0500  WBC 8.3  --  7.4  HGB 6.9* 8.5* 8.4*  HCT 21.7* 26.7* 26.5*  PLT 412*  --  432*   BMET Recent Labs    01/04/24 0425  NA 131*  K 4.3  CL 100  CO2 23  GLUCOSE 151*  BUN 20  CREATININE 0.54*  CALCIUM 7.7*   Anti-infectives: Anti-infectives (From admission, onward)    Start     Dose/Rate Route Frequency Ordered Stop   01/04/24 1400  piperacillin-tazobactam (ZOSYN) IVPB 3.375 g       Note to Pharmacy: Pharmacy to check dosing >> approved by [facility]   3.375 g 12.5 mL/hr over 240  Minutes Intravenous Every 8 hours 01/04/24 1257     12/25/23 1800  piperacillin-tazobactam (ZOSYN) IVPB 3.375 g  Status:  Discontinued       Note to Pharmacy: Pharmacy to check dosing >> approved by [facility]   3.375 g 12.5 mL/hr over 240 Minutes Intravenous Every 8 hours 12/25/23 1713 01/04/24 1257       Assessment/Plan: HD#12 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 Back to OR 4/10 for vac change. Will also evaluate fluctuant area lateral to stoma. 300 ml out of vac in 24 h Hgb down to 6.9 yesterday. 2 u prbcs 4/8, hgb this am 8.4 Continue IV Zosyn (3/28>>) NPO. Ok for coffee with cream, otherwise just sips of clears. NPO MN for OR tomorrow TNA, PICC line Nystatin for rash lower abdomen/groin. Discussed options for voiding management and will use purewick Palliative consulted to assist with pain and anxiety and adjustments made - appreciate their help   FEN: NPO/sips, TPN ID: [email or web address] 3/28> VTE: SCDs   DM with hyperglycemia - on insulin ABL anemia with anemia of chronic disease- as above - repeat hgb this afternoon History of VTE on xarelto at baseline. Hold anticoag   LOS: 12 days    Eric Form, [facility] Surgery 01/06/2024, 7:52 AM Please see Amion for pager number  during day hours 7:00am-4:30pm

## 2024-01-06 NOTE — Progress Notes (Signed)
   01/06/24 1501  Spiritual Encounters  Type of Visit Follow up  Care provided to: Pt and family  Reason for visit Routine spiritual support  OnCall Visit No   Follow up visit with patient who was sleeping. Per sister he had a medication change due to anxiety. Sister, Freilich Pita states patient has been in a lot of pain. Spouse was here earlier however she left to pick-up children. Sister asked that patient not be awakened due to his suffering from pain and reaction to pain medication. Patient expected to have a long journey to healing as his condition has regressed and wounds worsened. Provided spiritual care to sister who is worried about patient and patient's care.

## 2024-01-06 NOTE — Plan of Care (Signed)
  Problem: Coping: Goal: Ability to adjust to condition or change in health will improve Outcome: Progressing   Problem: Fluid Volume: Goal: Ability to maintain a balanced intake and output will improve Outcome: Progressing   Problem: Metabolic: Goal: Ability to maintain appropriate glucose levels will improve Outcome: Progressing   Problem: Clinical Measurements: Goal: Respiratory complications will improve Outcome: Progressing Goal: Cardiovascular complication will be avoided Outcome: Progressing   Problem: Elimination: Goal: Will not experience complications related to urinary retention Outcome: Progressing   Problem: Skin Integrity: Goal: Risk for impaired skin integrity will decrease Outcome: Not Progressing   Problem: Activity: Goal: Risk for activity intolerance will decrease Outcome: Not Progressing   Problem: Coping: Goal: Level of anxiety will decrease Outcome: Not Progressing

## 2024-01-07 ENCOUNTER — Other Ambulatory Visit: Payer: Self-pay

## 2024-01-07 ENCOUNTER — Inpatient Hospital Stay (HOSPITAL_COMMUNITY): Admitting: Anesthesiology

## 2024-01-07 ENCOUNTER — Encounter (HOSPITAL_COMMUNITY): Payer: Self-pay

## 2024-01-07 ENCOUNTER — Encounter (HOSPITAL_COMMUNITY): Admission: EM | Disposition: A | Discharge status: Skilled Nursing Facility | Payer: Self-pay | Source: Home / Self Care

## 2024-01-07 DIAGNOSIS — D62 Acute posthemorrhagic anemia: Secondary | ICD-10-CM | POA: Diagnosis not present

## 2024-01-07 HISTORY — PX: BLADDER REPAIR: SHX6721

## 2024-01-07 HISTORY — PX: IRRIGATION AND DEBRIDEMENT ABSCESS: SHX5252

## 2024-01-07 LAB — COMPREHENSIVE METABOLIC PANEL WITH GFR
ALT: 10 U/L (ref 0–44)
AST: 12 U/L — ABNORMAL LOW (ref 15–41)
Albumin: <1.5 g/dL — ABNORMAL LOW (ref 3.5–5.0)
Alkaline Phosphatase: 137 U/L — ABNORMAL HIGH (ref 38–126)
Anion gap: 7 (ref 5–15)
BUN: 23 mg/dL — ABNORMAL HIGH (ref 6–20)
CO2: 25 mmol/L (ref 22–32)
Calcium: 7.7 mg/dL — ABNORMAL LOW (ref 8.9–10.3)
Chloride: 103 mmol/L (ref 98–111)
Creatinine, Ser: 0.71 mg/dL (ref 0.61–1.24)
GFR, Estimated: >60 mL/min (ref >60)
Glucose, Bld: 68 mg/dL — ABNORMAL LOW (ref 70–99)
Potassium: 4.3 mmol/L (ref 3.5–5.1)
Sodium: 135 mmol/L (ref 135–145)
Total Bilirubin: 0.4 mg/dL (ref 0.0–1.2)
Total Protein: 5.6 g/dL — ABNORMAL LOW (ref 6.5–8.1)

## 2024-01-07 LAB — GLUCOSE, CAPILLARY
Glucose-Capillary: 75 mg/dL (ref 70–99)
Glucose-Capillary: 96 mg/dL (ref 70–99)
Glucose-Capillary: 82 mg/dL (ref 70–99)
Glucose-Capillary: 99 mg/dL (ref 70–99)

## 2024-01-07 LAB — CBC
HCT: 27.3 % — ABNORMAL LOW (ref 39.0–52.0)
Hemoglobin: 8.8 g/dL — ABNORMAL LOW (ref 13.0–17.0)
MCH: 27 pg (ref 26.0–34.0)
MCHC: 32.2 g/dL (ref 30.0–36.0)
MCV: 83.7 fL (ref 80.0–100.0)
Platelets: 513 10*3/uL — ABNORMAL HIGH (ref 150–400)
RBC: 3.26 MIL/uL — ABNORMAL LOW (ref 4.22–5.81)
RDW: 19.3 % — ABNORMAL HIGH (ref 11.5–15.5)
WBC: 9.7 10*3/uL (ref 4.0–10.5)
nRBC: 0 % (ref 0.0–0.2)

## 2024-01-07 LAB — PHOSPHORUS: Phosphorus: 3.1 mg/dL (ref 2.5–4.6)

## 2024-01-07 LAB — MAGNESIUM: Magnesium: 1.9 mg/dL (ref 1.7–2.4)

## 2024-01-07 SURGERY — IRRIGATION AND DEBRIDEMENT ABSCESS
Anesthesia: General

## 2024-01-07 MED ORDER — LIDOCAINE HCL (CARDIAC) PF 100 MG/5ML IV SOSY
PREFILLED_SYRINGE | INTRAVENOUS | Status: DC | PRN
  Administered 2024-01-07: 60 mg via INTRAVENOUS

## 2024-01-07 MED ORDER — PROPOFOL 10 MG/ML IV BOLUS
INTRAVENOUS | Status: DC | PRN
  Administered 2024-01-07: 150 mg via INTRAVENOUS

## 2024-01-07 MED ORDER — ROCURONIUM BROMIDE 10 MG/ML (PF) SYRINGE
PREFILLED_SYRINGE | INTRAVENOUS | Status: AC
  Filled 2024-01-07: qty 10

## 2024-01-07 MED ORDER — CHLORHEXIDINE GLUCONATE 0.12 % MT SOLN
15.0000 mL | Freq: Once | OROMUCOSAL | Status: AC
  Administered 2024-01-07: 15 mL via OROMUCOSAL

## 2024-01-07 MED ORDER — ROCURONIUM BROMIDE 100 MG/10ML IV SOLN
INTRAVENOUS | Status: DC | PRN
  Administered 2024-01-07: 40 mg via INTRAVENOUS

## 2024-01-07 MED ORDER — ORAL CARE MOUTH RINSE: 15.0000 mL | Freq: Once | OROMUCOSAL | Status: AC

## 2024-01-07 MED ORDER — INSULIN ASPART 100 UNIT/ML IJ SOLN
0.0000 [IU] | INTRAMUSCULAR | Status: DC
  Administered 2024-01-09: 1 [IU] via SUBCUTANEOUS
  Administered 2024-01-09: 3 [IU] via SUBCUTANEOUS
  Administered 2024-01-09 – 2024-01-10 (×2): 2 [IU] via SUBCUTANEOUS
  Administered 2024-01-10: 1 [IU] via SUBCUTANEOUS
  Administered 2024-01-10: 2 [IU] via SUBCUTANEOUS
  Administered 2024-01-10 – 2024-01-11 (×3): 1 [IU] via SUBCUTANEOUS
  Administered 2024-01-11: 2 [IU] via SUBCUTANEOUS
  Administered 2024-01-11 (×2): 1 [IU] via SUBCUTANEOUS
  Administered 2024-01-11: 5 [IU] via SUBCUTANEOUS
  Administered 2024-01-11: 3 [IU] via SUBCUTANEOUS
  Administered 2024-01-11: 2 [IU] via SUBCUTANEOUS
  Administered 2024-01-12: 5 [IU] via SUBCUTANEOUS
  Administered 2024-01-12: 3 [IU] via SUBCUTANEOUS
  Administered 2024-01-12: 1 [IU] via SUBCUTANEOUS
  Administered 2024-01-12 (×2): 3 [IU] via SUBCUTANEOUS
  Administered 2024-01-13: 2 [IU] via SUBCUTANEOUS
  Administered 2024-01-13: 1 [IU] via SUBCUTANEOUS
  Administered 2024-01-13: 2 [IU] via SUBCUTANEOUS
  Administered 2024-01-13 (×2): 1 [IU] via SUBCUTANEOUS
  Filled 2024-01-07: qty 1

## 2024-01-07 MED ORDER — STERILE WATER FOR IRRIGATION IR SOLN
Status: DC | PRN
  Administered 2024-01-07: 1000 mL

## 2024-01-07 MED ORDER — HYDROMORPHONE HCL 2 MG/ML IJ SOLN
INTRAMUSCULAR | Status: AC
  Filled 2024-01-07: qty 1

## 2024-01-07 MED ORDER — HYDROMORPHONE HCL 1 MG/ML IJ SOLN
0.2500 mg | INTRAMUSCULAR | Status: DC | PRN
  Administered 2024-01-07 (×3): 0.5 mg via INTRAVENOUS

## 2024-01-07 MED ORDER — LACTATED RINGERS IV SOLN: INTRAVENOUS | Status: DC

## 2024-01-07 MED ORDER — HYDROMORPHONE HCL 1 MG/ML IJ SOLN
INTRAMUSCULAR | Status: AC
  Administered 2024-01-07: 0.5 mg via INTRAVENOUS
  Filled 2024-01-07: qty 2

## 2024-01-07 MED ORDER — LACTATED RINGERS IV SOLN: INTRAVENOUS | Status: AC

## 2024-01-07 MED ORDER — DEXMEDETOMIDINE HCL IN NACL 80 MCG/20ML IV SOLN
INTRAVENOUS | Status: DC | PRN
  Administered 2024-01-07 (×2): 6 ug via INTRAVENOUS
  Administered 2024-01-07: 8 ug via INTRAVENOUS

## 2024-01-07 MED ORDER — INSULIN ASPART 100 UNIT/ML IJ SOLN: 0.0000 [IU] | INTRAMUSCULAR | Status: DC | PRN

## 2024-01-07 MED ORDER — MIDAZOLAM HCL 5 MG/5ML IJ SOLN
INTRAMUSCULAR | Status: DC | PRN
Start: 2024-01-07 — End: 2024-01-07
  Administered 2024-01-07: 2 mg via INTRAVENOUS

## 2024-01-07 MED ORDER — LIDOCAINE HCL (PF) 1 % IJ SOLN
INTRAMUSCULAR | Status: AC
  Filled 2024-01-07: qty 30

## 2024-01-07 MED ORDER — 0.9 % SODIUM CHLORIDE (POUR BTL) OPTIME
TOPICAL | Status: DC | PRN
  Administered 2024-01-07: 1000 mL

## 2024-01-07 MED ORDER — INSULIN ASPART 100 UNIT/ML IJ SOLN: 0.0000 [IU] | INTRAMUSCULAR | Status: DC

## 2024-01-07 MED ORDER — SUGAMMADEX SODIUM 200 MG/2ML IV SOLN
INTRAVENOUS | Status: DC | PRN
  Administered 2024-01-07: 200 mg via INTRAVENOUS

## 2024-01-07 MED ORDER — DEXAMETHASONE SODIUM PHOSPHATE 10 MG/ML IJ SOLN
INTRAMUSCULAR | Status: AC
  Filled 2024-01-07: qty 1

## 2024-01-07 MED ORDER — ONDANSETRON HCL 4 MG/2ML IJ SOLN
INTRAMUSCULAR | Status: AC
  Filled 2024-01-07: qty 2

## 2024-01-07 MED ORDER — TRAVASOL 10 % IV SOLN
INTRAVENOUS | Status: AC
  Filled 2024-01-07: qty 1231.2

## 2024-01-07 MED ORDER — MIDAZOLAM HCL 2 MG/2ML IJ SOLN
INTRAMUSCULAR | Status: AC
  Filled 2024-01-07: qty 2

## 2024-01-07 MED ORDER — DEXTROSE 10 % IV SOLN
INTRAVENOUS | Status: AC
  Filled 2024-01-07: qty 1000

## 2024-01-07 MED ORDER — HYDROMORPHONE HCL 1 MG/ML IJ SOLN
2.0000 mg | INTRAMUSCULAR | Status: DC | PRN
  Administered 2024-01-07 – 2024-01-10 (×14): 2 mg via INTRAVENOUS
  Filled 2024-01-07 (×14): qty 2

## 2024-01-07 MED ORDER — BUPIVACAINE-EPINEPHRINE (PF) 0.25% -1:200000 IJ SOLN
INTRAMUSCULAR | Status: AC
  Filled 2024-01-07: qty 30

## 2024-01-07 MED ORDER — KETAMINE HCL 50 MG/5ML IJ SOSY
PREFILLED_SYRINGE | INTRAMUSCULAR | Status: AC
  Filled 2024-01-07: qty 5

## 2024-01-07 MED ORDER — LIDOCAINE HCL (PF) 2 % IJ SOLN
INTRAMUSCULAR | Status: AC
  Filled 2024-01-07: qty 5

## 2024-01-07 SURGICAL SUPPLY — 33 items
BAG COUNTER SPONGE SURGICOUNT (BAG) IMPLANT
BLADE HEX COATED 2.75 (ELECTRODE) ×2 IMPLANT
BLADE SURG SZ10 CARB STEEL (BLADE) ×4 IMPLANT
DERMABOND ADVANCED .7 DNX12 (GAUZE/BANDAGES/DRESSINGS) ×2 IMPLANT
DRAPE LAPAROTOMY T 102X78X121 (DRAPES) IMPLANT
DRAPE LAPAROTOMY TRNSV 102X78 (DRAPES) IMPLANT
DRAPE SHEET LG 3/4 BI-LAMINATE (DRAPES) IMPLANT
DRSG VAC GRANUFOAM MED (GAUZE/BANDAGES/DRESSINGS) IMPLANT
DRSG VERSA FOAM LRG 10X15 (GAUZE/BANDAGES/DRESSINGS) IMPLANT
ELECT REM PT RETURN 15FT ADLT (MISCELLANEOUS) ×2 IMPLANT
EVACUATOR SILICONE 100CC (DRAIN) IMPLANT
GAUZE SPONGE 4X4 12PLY STRL (GAUZE/BANDAGES/DRESSINGS) ×2 IMPLANT
GLOVE BIO SURGEON STRL SZ7.5 (GLOVE) ×4 IMPLANT
GLOVE BIOGEL PI IND STRL 7.0 (GLOVE) ×2 IMPLANT
GOWN STRL REUS W/ TWL XL LVL3 (GOWN DISPOSABLE) ×4 IMPLANT
KIT BASIN OR (CUSTOM PROCEDURE TRAY) ×2 IMPLANT
KIT TURNOVER KIT A (KITS) IMPLANT
MARKER SKIN DUAL TIP RULER LAB (MISCELLANEOUS) IMPLANT
NDL HYPO 25X1 1.5 SAFETY (NEEDLE) ×2 IMPLANT
NEEDLE HYPO 25X1 1.5 SAFETY (NEEDLE) ×2 IMPLANT
NS IRRIG 1000ML POUR BTL (IV SOLUTION) ×2 IMPLANT
PACK BASIC VI WITH GOWN DISP (CUSTOM PROCEDURE TRAY) ×2 IMPLANT
PENCIL SMOKE EVACUATOR (MISCELLANEOUS) IMPLANT
SOL PREP POV-IOD 4OZ 10% (MISCELLANEOUS) ×2 IMPLANT
SPIKE FLUID TRANSFER (MISCELLANEOUS) IMPLANT
SPONGE T-LAP 4X18 ~~LOC~~+RFID (SPONGE) ×2 IMPLANT
STAPLER SKIN PROX WIDE 3.9 (STAPLE) IMPLANT
SUT MNCRL AB 4-0 PS2 18 (SUTURE) IMPLANT
SUT SILK 2 0 SH CR/8 (SUTURE) IMPLANT
SUT VIC AB 3-0 SH 18 (SUTURE) IMPLANT
SYR CONTROL 10ML LL (SYRINGE) ×2 IMPLANT
TOWEL OR 17X26 10 PK STRL BLUE (TOWEL DISPOSABLE) ×2 IMPLANT
WATER STERILE IRR 1000ML POUR (IV SOLUTION) ×2 IMPLANT

## 2024-01-07 NOTE — Progress Notes (Signed)
 Daily Progress Note   Patient Name: Steven [name]       Date: 01/07/2024 DOB: [birth year]  Age: 53 y.o. MRN#: [id number] Attending Physician: Steven Limbo, MD Primary Care Physician: Steven Retort, MD Admit Date: 12/25/2023 Length of Stay: 13 days  Reason for Consultation/Follow-up: Pain control  Subjective:   CC: Patient laying in bed noting severe pain after procedure this morning.  Following up regarding pain control.  Subjective:  Reviewed EMR prior to presenting to bedside.  At time of EMR review in past 24 hours patient has received as needed IV Dilaudid 2 mg x 5 doses, as needed oxycodone 15 mg x 3 doses, and as needed IV Ativan 0.5 mg x 2 doses.  Patient was started on MS Contin 15 mg every 8 hours during the day on 01/06/2024. Reviewed surgery and urology postop notes.  Patient went for debridement of perineal wound with fistula this morning and to change wound VAC dressing.  Under general anesthesia, patient had Foley placed.  Once patient was placed prone, Foley balloon could be seen in the perineal wound field.  Urology consulted to assist with surgery to manage bladder fistula/cystostomy.  Of note urology note stated that there is a low chance of bladder healing due to prior radiation and infected perineal wound.  At this time we will be keeping Foley catheter in place as long as it is draining well.  If it stops draining or concerned that the fistula reopens, would need to consider bilateral percutaneous nephrostomy tubes.  Discussed care with bedside RN.  She was able to inform this provider when patient had return from PACU.  Presented to bedside to see patient.  Patient's wife present at bedside.  Patient laying over in bed grimacing and appears pale.  Patient noting severe pain at this time.  Patient was given his scheduled long-acting morphine and Tylenol at this time.  Patient noted he will requested IV Dilaudid as that helps best with this acute pain.  Noted would adjust  frequency of dosing so patient can receive currently.  Patient appears to be in severe acute pain.  Because of patient's current symptom burden, did not engage in further discussions.  Noted would make medication adjustments so patient could have pain management.  Patient and wife agreeing with this plan.  Discussed care plan with RN and surgery team after visit.  Did note to surgery team patient's hypotension after procedures this morning.  Concerned about fluid loss with wound.  Defer management to surgery team.  Objective:   Vital Signs:  BP 120/63   Pulse 97   Temp 99.6 F (37.6 C) (Temporal)   Resp 20   Ht 5\' 10"  (1.778 m)   Wt 80.2 kg   SpO2 95%   BMI 25.37 kg/m   Physical Exam: General: Awake, pale, ill-appearing, grimacing Cardiovascular: RRR Respiratory: no increased work of breathing noted, not in respiratory distress Neuro: A&Ox4 Psych: Appears to be in pain  Imaging: I personally reviewed recent imaging.   Assessment & Plan:   Assessment: Patient is a 53 year old male with a past medical history of diabetes mellitus type 2, VTE on Xarelto, stage II rectal adenocarcinoma status post neoadjuvant concurrent chemoradiation in 2022 followed by a low anterior resection with a complicated postoperative course including need for ostomy with chronic pelvic abscess who was admitted on 12/25/2023 under general surgery service for management of perirectal wound bleeding. During hospitalization, patient has required multiple transfusions for acute blood loss anemia secondary to perineal  wound. 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 Palliative medicine team consulted to assist with pain management.   Recommendations/Plan: # Symptom management: Patient is receiving these palliative interventions for symptom management with an intent to improve quality of life.                  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   Patient now with worsening pain in setting of procedure this morning as detailed above in HPI.                               -Continue MS Contin 15 mg every 8 hours during the day.  Will continue to increase slowly as tolerated by patient.                               - Continue oxycodone 10-15 mg every 4 hours as needed                               - Increase IV hydromorphone to 2 mg every 2 hours as needed                               - Continue Tylenol to 1000 mg tab every 8 hours                  - Anxiety                               - Continue IV Ativan 0.5 mg every 6 hours as needed    - Will likely need to consider long-acting medications for anxiety though with patient's acute severity of pain, did not want to start new medication at this time as focusing on pain management.  # Psycho-social/Spiritual Support:  - Support System: Wife  # Discharge Planning: To Be Determined  Discussed with: Patient, patient's wife, RN, surgery team  Thank you for allowing the palliative care team to participate in the care Steven [name].  Alvester Morin, DO Palliative Care Provider PMT # [phone]  If patient remains symptomatic despite maximum doses, please call PMT at [phone] between 0700 and 1900. Outside of these hours, please call attending, as PMT does not have night coverage.

## 2024-01-07 NOTE — Op Note (Signed)
 01/07/2024  10:33 AM  PATIENT:  Steven [name]  53 y.o. male  PRE-OPERATIVE DIAGNOSIS:  PERINEAL WOUND WITH FISTULA  POST-OPERATIVE DIAGNOSIS:  PERINEAL WOUND WITH FISTULA  PROCEDURE:  Procedure(s) with comments: IRRIGATION AND DEBRIDEMENT ABSCESS (N/A) - WOUND EXPLORATION AND DEBRIDEMENT.  CHANGE WOUND VAC DRESSING.  POSSIBLE IRRIGATION AND DEBRIDEMENT OF ABDOMINAL WALL ABSCESS.  SURGEON:  Surgeons and Role:    * Axel Filler, MD - Primary  ASSISTANTS: Dr. Arita Miss   ANESTHESIA:   general  EBL:  40 mL   BLOOD ADMINISTERED:none  DRAINS: none   LOCAL MEDICATIONS USED:  NONE  SPECIMEN:  No Specimen  DISPOSITION OF SPECIMEN:  N/A  [name]:  YES  TOURNIQUET:  * No tourniquets in log *  DICTATION: .Dragon Dictation Indication procedure: Patient is a 53 year old male with history of a pelvic wound that has required several serial VAC dressing changes.  Patient taken back to the OR for wound VAC change.  Findings: Patient with good granulation tissue in the pelvis.  There is no small bowel succus that could be seen.  However there was the Foley catheter that could be seen.  This appeared to be in the posterior lower portion of the bladder.  Dr. Arita Miss was able to advance the catheter into the bladder.  Several stitches were placed to help reapproximate the tissue around the catheter.  There is some small amount of purulence in the left pelvic tract.  This was irrigated out.  There is minimal debridement of the wound.  The 9:00 area of the ostomy appeared to have some purulence.  This was drained into the ostomy.  Details of procedure: After the patient was consented patient was taken back to the OR and placed in supine position with bilateral SCDs in place.  Patient underwent general tracheal intubation.  I  interrogated the abscess and it appeared to drain into the ostomy.  At this time the patient was positioned in the prone position.  The VAC and sponges were removed in  their entirety.  The areas prepped and draped in standard fashion.  At this time evaluating the pelvic portion of the wound.  The Foley balloon can easily be seen.  I consulted urology and Dr. Arita Miss.  Please see the her operative note.    there is no succus that could be seen in the pelvis of the wound.    There was some minimal fibrinous exudate throughout the wound.  This was debrided with a curette.  There was a minimal amount of purulence in the left basically lower intramuscular space.  This was evacuated.  This irrigated out with sterile saline.  At this time the inferior portion of the perineal wound was reapproximated using 2 oh silks to help eliminate some of the space.  A white VAC was placed in the perineal portion of the wound.  1 portion of the then sponge was placed into the 2 left thin muscular spaces.  A large sponge was placed over the remaining portion of the sacral wound.  This was hooked up to suction and held suction.  Patient was extubated and taken to theICU in stable condition.   PLAN OF CARE: Admit to inpatient   PATIENT DISPOSITION:  ICU - extubated and stable.   Delay start of Pharmacological VTE agent (>24hrs) due to surgical blood loss or risk of bleeding: not applicable

## 2024-01-07 NOTE — Anesthesia Postprocedure Evaluation (Signed)
 Anesthesia Post Note  Patient: Steven [name]  Procedure(s) Performed: IRRIGATION AND DEBRIDEMENT ABSCESS (N/A) - WOUND EXPLORATION AND DEBRIDEMENT.  CHANGE WOUND VAC DRESSING.  IRRIGATION AND DEBRIDEMENT OF ABDOMINAL WALL ABSCESS. Closure of bladder cystotomy/fistula     Patient location during evaluation: PACU Anesthesia Type: General Level of consciousness: awake and alert Pain management: pain level controlled Vital Signs Assessment: post-procedure vital signs reviewed and stable Respiratory status: spontaneous breathing, nonlabored ventilation, respiratory function stable and patient connected to nasal cannula oxygen Cardiovascular status: blood pressure returned to baseline and stable Postop Assessment: no apparent nausea or vomiting Anesthetic complications: no  No notable events documented.  Last Vitals:  Vitals:   01/07/24 1130 01/07/24 1145  BP: 116/65 127/65  Pulse: 90 95  Resp: (!) 9 19  Temp:    SpO2: 99% 100%    Last Pain:  Vitals:   01/07/24 1145  TempSrc:   PainSc: 1                  Reyes Aldaco,W. EDMOND

## 2024-01-07 NOTE — Op Note (Signed)
 Operative Note  Preoperative diagnosis:  1.  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  Bladder cystotomy/? fistula to perineal wound  Postoperative diagnosis: 1.  Bladder cystotomy/? fistula to perineal wound  Procedure(s): 1.  Closure of bladder cystotomy/fistula  Surgeon: Kasandra Knudsen, MD  Assistants:  None  Anesthesia:  General  Complications:  None  EBL:  see general surgery note  Specimens: 1. none  Drains/Catheters: 1.  16Fr foley catheter with 20cc in balloon  Intraoperative findings:   Surgically absent prostate Foley catheter balloon visualized in perineal wound After balloon deflated, examination of anterior tissue appeared consistent with bladder mucosa  Indication:  Steven [name] is a 53 y.o. male with history of rectal cancer 2022 s/p chemoradiation, LAR, redo LAR with colostomy followed by completion APR 11/2023 with ongoing wound necrosis.    Description of procedure:  Patient was already under general anesthesia in the prone position when urology was consulted.  Patient's Foley catheter balloon was noted to be in perineal wound field.  Patient was under general anesthesia for perineal wound washout and VAC placement.  Inspection of perineal wound revealed necrotic appearing sacrum.  Surgically absent prostate.  Foley catheter balloon appeared to be in bladder fistula/cystotomy.  Balloon was deflated and advanced until it was hubbed.  20 cc of sterile water were then used to inflate the balloon.  Inspection of the mucosa on the anterior surface appeared consistent with bladder mucosa.  Patient had thickened fibrotic tissue on the posterior wall of the bladder that was not easily mobilized.  This was closed with interrupted 3-0 Vicryl sutures.  The case was then turned back over to general surgery.  Plan: Low chance of bladder healing due to prior radiation and infected perineal wound.  Keep Foley catheter in place as long as it is draining well.  If  stops draining or concerned that the fistula reopens would consider bilateral percutaneous nephrostomy tubes.

## 2024-01-07 NOTE — Anesthesia Preprocedure Evaluation (Signed)
 Anesthesia Evaluation  Patient identified by MRN, date of birth, ID band Patient awake    Reviewed: Allergy & Precautions, H&P , NPO status , Patient's Chart, lab work & pertinent test results  Airway Mallampati: II  TM Distance: >3 FB Neck ROM: Full    Dental no notable dental hx. (+) Teeth Intact, Dental Advisory Given   Pulmonary asthma , sleep apnea and Continuous Positive Airway Pressure Ventilation , PE   Pulmonary exam normal breath sounds clear to auscultation       Cardiovascular hypertension, + Peripheral Vascular Disease   Rhythm:Regular Rate:Normal     Neuro/Psych   Anxiety Depression    negative neurological ROS     GI/Hepatic Neg liver ROS,GERD  Medicated,,  Endo/Other  diabetes, Type 2, Oral Hypoglycemic Agents    Renal/GU negative Renal ROS  negative genitourinary   Musculoskeletal   Abdominal   Peds  Hematology  (+) Blood dyscrasia, anemia   Anesthesia Other Findings   Reproductive/Obstetrics negative OB ROS                             Anesthesia Physical Anesthesia Plan  ASA: 3  Anesthesia Plan: General   Post-op Pain Management: Tylenol PO (pre-op)*   Induction: Intravenous  PONV Risk Score and Plan: 3 and Ondansetron, Dexamethasone and Midazolam  Airway Management Planned: Oral ETT  Additional Equipment:   Intra-op Plan:   Post-operative Plan: Extubation in OR  Informed Consent: I have reviewed the patients History and Physical, chart, labs and discussed the procedure including the risks, benefits and alternatives for the proposed anesthesia with the patient or authorized representative who has indicated his/her understanding and acceptance.     Dental advisory given  Plan Discussed with: CRNA  Anesthesia Plan Comments:        Anesthesia Quick Evaluation

## 2024-01-07 NOTE — Progress Notes (Signed)
 PROGRESS NOTE  Steven [name]  WUJ:[id number] DOB: 1971/03/22 DOA: 12/25/2023 PCP: Noberto Retort, MD   Brief Narrative: Patient is a 53 year old male with history of diabetes type 2, VTE on Xarelto, stage II rectal adenocarcinoma status post neoadjuvant concurrent chemoradiation in 2022 followed by low anterior resection complicated postoperative course with a chronic pelvic abscess, was admitted under general surgery service after he presented with perirectal wound bleeding.  Found to have acute blood loss anemia.  We were consulted for medical management, primarily  for diabetes.  He was recently admitted by general surgery service and was discharged home on 12/17/2023 after surgical debridement of his perineal wound.  Patient has been transfused with PRBCs during this hospitalization.  General surgery is the primary team.  Underwent excisional debridement, irrigation of sacral wound and application of wound VAC on 4/2.Underwent repeat debridement on 4/7.  Hospital course remarkable for persistent pain on the perineal area.  Underwent repeat irrigation and debridement, wound VAC exchange, closure of bladder cystostomy/fistula on 4/10  Assessment & Plan:  Principal Problem:   Anemia due to acute blood loss Active Problems:   Post-operative hemoglobin drop   High risk medication use   Need for emotional support   Counseling and coordination of care   Medication management   Palliative care encounter   Disruption of perineal wound in male   Pain   Acute blood loss anemia/perineal wound: Secondary to bleeding from perineal wound.   Total of 7 units of blood transfusion during this hospitalization.  Continue to monitor H&H.  Last Hemoglobin in the range of 8  Stage II rectal adenocarcinoma:Status post neoadjuvant concurrent chemoradiation in 2022 followed by low anterior resection complicated postoperative course with a chronic pelvic abscess, was admitted under general surgery service after  he presented with perirectal wound bleeding.  Started on TPN.currently on clear liquid diet.  Also has ostomy.Underwent excisional debridement, irrigation of sacral wound and application of wound VAC on 4/2.Underwent repeat debridement on 4/7.  Underwent repeat irrigation and debridement, wound VAC exchange, closure of bladder cystostomy/fistula on 4/10  Diabetes type 2: He has been  on sliding scale only.   diabetic coordinator consulted.  Last A1c in the range of 7.5.  A1c of 10.3 about a month ago.  Monitor blood sugars,stable now  History of VTE: On Xarelto, currently on hold  Leukocytosis: Improving.  Currently on Zosyn.  Cultures have been negative so far. UA done on 4/1 showed some WBC but patient is already on Zosyn.  Case discussed with Dr. Derrell Lolling.  We are contributing minimal for Mr Prather's care.Dr Derrell Lolling is agreeable for [facility] to sign off.  Please recall as needed   Nutrition Problem: Increased nutrient needs Etiology: wound healing    DVT prophylaxis:SCDs Start: 12/25/23 0912     Code Status: Full Code   Antimicrobials:  Anti-infectives (From admission, onward)    Start     Dose/Rate Route Frequency Ordered Stop   01/04/24 1400  piperacillin-tazobactam (ZOSYN) IVPB 3.375 g       Note to Pharmacy: Pharmacy to check dosing >> approved by [facility]   3.375 g 12.5 mL/hr over 240 Minutes Intravenous Every 8 hours 01/04/24 1257     12/25/23 1800  piperacillin-tazobactam (ZOSYN) IVPB 3.375 g  Status:  Discontinued       Note to Pharmacy: Pharmacy to check dosing >> approved by [facility]   3.375 g 12.5 mL/hr over 240 Minutes Intravenous Every 8 hours 12/25/23 1713 01/04/24 1257  Subjective: Patient just came from the OR when I evaluated him.  In bed, hemodynamically stable.  Wife at bedside.  No new complaints  Objective: Vitals:   01/07/24 1100 01/07/24 1115 01/07/24 1130 01/07/24 1145  BP: 137/74 119/69 116/65 127/65  Pulse: 98 91 90 95  Resp: 10 11 (!) 9 19  Temp:       TempSrc:      SpO2: 100% 93% 99% 100%  Weight:      Height:        Intake/Output Summary (Last 24 hours) at 01/07/2024 1348 Last data filed at 01/07/2024 1053 Gross per 24 hour  Intake 2259.21 ml  Output 1915 ml  Net 344.21 ml   Filed Weights   01/01/24 0500 01/06/24 0500 01/07/24 0800  Weight: 78.5 kg 80.2 kg 80.2 kg    Examination:  General exam: Overall comfortable, not in distress HEENT: PERRL Respiratory system:  no wheezes or crackles  Cardiovascular system: S1 & S2 heard, RRR.  Gastrointestinal system: Abdomen is nondistended, soft and nontender.  Ostomy Central nervous system: Alert and oriented Extremities: No edema, no clubbing ,no cyanosis, PICC line Skin: Perineal wound Foley catheter   Data Reviewed: I have personally reviewed following labs and imaging studies  CBC: Recent Labs  Lab 01/02/24 0517 01/03/24 0849 01/05/24 0505 01/05/24 1012 01/06/24 0034 01/06/24 0500 01/07/24 0626  WBC 10.8*  --  8.2 8.3  --  7.4 9.7  NEUTROABS 8.8*  --   --   --   --   --   --   HGB 7.8*   < > 7.3* 6.9* 8.5* 8.4* 8.8*  HCT 24.2*   < > 22.8* 21.7* 26.7* 26.5* 27.3*  MCV 84.3  --  91.6 84.4  --  93.3 83.7  PLT 444*  --  424* 412*  --  432* 513*   < > = values in this interval not displayed.   Basic Metabolic Panel: Recent Labs  Lab 01/01/24 0525 01/01/24 1332 01/01/24 1423 01/02/24 0514 01/03/24 0525 01/04/24 0425 01/07/24 0626  NA 135   < > 135 134* 134* 131* 135  K 3.9   < > 4.3 4.1 4.3 4.3 4.3  CL 101   < > 101 103 101 100 103  CO2 26  --   --  26 25 23 25   GLUCOSE 145*   < > 193* 207* 116* 151* 68*  BUN 25*   < > 19 20 20 20  23*  CREATININE 0.70   < > 0.70 0.56* 0.36* 0.54* 0.71  CALCIUM 7.7*  --   --  7.8* 7.8* 7.7* 7.7*  MG  --   --   --  1.9  --  1.7 1.9  PHOS  --   --   --  2.8  --  3.0 3.1   < > = values in this interval not displayed.     No results found for this or any previous visit (from the past 240 hours).    Radiology  Studies: No results found.   Scheduled Meds:  acetaminophen  1,000 mg Oral Q8H   Chlorhexidine Gluconate Cloth  6 each Topical Q2200   insulin aspart  0-9 Units Subcutaneous Q4H   magic mouthwash w/lidocaine  10 mL Oral TID   methocarbamol (ROBAXIN) injection  500 mg Intravenous Q6H   morphine  15 mg Oral Q8H   nutrition supplement (JUVEN)  1 packet Oral BID BM   nystatin   Topical BID   sodium  chloride flush  10-40 mL Intracatheter Q12H   Continuous Infusions:  dextrose 30 mL/hr at 01/07/24 0913   lactated ringers     piperacillin-tazobactam (ZOSYN)  IV 3.375 g (01/07/24 1323)   TPN ADULT (ION) 90 mL/hr at 01/07/24 0232   TPN ADULT (ION)       LOS: 13 days   Burnadette Pop, MD [facility] P4/06/2024, 1:48 PM

## 2024-01-07 NOTE — Progress Notes (Signed)
*   Day of Surgery *   Subjective/Chief Complaint: PT doing well todau Issues with urination   Objective: Vital signs in last 24 hours: Temp:  [98.3 F (36.8 C)-99.6 F (37.6 C)] 99.6 F (37.6 C) (04/10 0800) Pulse Rate:  [87-101] 97 (04/10 0845) Resp:  [15-23] 20 (04/10 0845) BP: (102-140)/(53-67) 120/63 (04/10 0845) SpO2:  [94 %-98 %] 95 % (04/10 0845) Weight:  [80.2 kg] 80.2 kg (04/10 0800) Last BM Date : 01/06/24  Intake/Output from previous day: 04/09 0701 - 04/10 0700 In: 2965.5 [P.O.:240; I.V.:2568.7; IV Piggyback:156.7] Out: 4455 [Urine:4005; Drains:450] Intake/Output this shift: No intake/output data recorded.  General appearance: alert, cooperative, and no distress Resp: breathing comfortably Cardio: regular rate and rhythm GI: soft, non distended, ostomy with gas and small amount of liquid stool. Right lateral abdomen tracking from under colostomy device at 9 o clock there is less erythema and fluctuance. Vac cannister with bloody output. Lower abdominal wall within crease there is an erythematous rash related to moisture MSK: bilateral feet edematous  Lab Results:  Recent Labs    01/06/24 0500 01/07/24 0626  WBC 7.4 9.7  HGB 8.4* 8.8*  HCT 26.5* 27.3*  PLT 432* 513*   BMET Recent Labs    01/07/24 0626  NA 135  K 4.3  CL 103  CO2 25  GLUCOSE 68*  BUN 23*  CREATININE 0.71  CALCIUM 7.7*   PT/INR No results for input(s): "LABPROT", "INR" in the last 72 hours. ABG No results for input(s): "PHART", "HCO3" in the last 72 hours.  Invalid input(s): "PCO2", "PO2"  Studies/Results: No results found.  Anti-infectives: Anti-infectives (From admission, onward)    Start     Dose/Rate Route Frequency Ordered Stop   01/04/24 1400  [MAR Hold]  piperacillin-tazobactam (ZOSYN) IVPB 3.375 g        (MAR Hold since Thu 01/07/2024 at 0751.Hold Reason: Transfer to a Procedural area)  Note to Pharmacy: Pharmacy to check dosing >> approved by [facility]   3.375 g 12.5  mL/hr over 240 Minutes Intravenous Every 8 hours 01/04/24 1257     12/25/23 1800  piperacillin-tazobactam (ZOSYN) IVPB 3.375 g  Status:  Discontinued       Note to Pharmacy: Pharmacy to check dosing >> approved by [facility]   3.375 g 12.5 mL/hr over 240 Minutes Intravenous Every 8 hours 12/25/23 1713 01/04/24 1257       Assessment/Plan: HD#12 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 Will also evaluate fluctuant area lateral to stoma. 300 ml out of vac in 24 h Hgb stable at 8.8 Continue IV Zosyn (3/28>>) NPO. Ok for coffee with cream, otherwise just sips of clears.  TNA, PICC line Nystatin for rash lower abdomen/groin. Discussed options for voiding management and will use purewick Palliative consulted to assist with pain and anxiety and adjustments made - appreciate their help   FEN: NPO/sips, TPN ID: [email or web address] 3/28> VTE: SCDs   DM with hyperglycemia - on insulin ABL anemia with anemia of chronic disease- as above - repeat hgb this afternoon History of VTE on xarelto at baseline. Hold anticoag  LOS: 13 days    Axel Filler 01/07/2024

## 2024-01-07 NOTE — Plan of Care (Signed)
  Problem: Fluid Volume: Goal: Ability to maintain a balanced intake and output will improve Outcome: Progressing   Problem: Coping: Goal: Ability to adjust to condition or change in health will improve Outcome: Not Progressing   Problem: Nutritional: Goal: Maintenance of adequate nutrition will improve Outcome: Not Progressing   Problem: Skin Integrity: Goal: Risk for impaired skin integrity will decrease Outcome: Not Progressing   Problem: Tissue Perfusion: Goal: Adequacy of tissue perfusion will improve Outcome: Not Progressing   Problem: Activity: Goal: Risk for activity intolerance will decrease Outcome: Not Progressing

## 2024-01-07 NOTE — Plan of Care (Signed)
  Problem: Coping: Goal: Ability to adjust to condition or change in health will improve Outcome: Progressing   Problem: Metabolic: Goal: Ability to maintain appropriate glucose levels will improve Outcome: Progressing   Problem: Skin Integrity: Goal: Risk for impaired skin integrity will decrease Outcome: Progressing   Problem: Tissue Perfusion: Goal: Adequacy of tissue perfusion will improve Outcome: Progressing   Problem: Clinical Measurements: Goal: Ability to maintain clinical measurements within normal limits will improve Outcome: Progressing

## 2024-01-07 NOTE — Anesthesia Procedure Notes (Signed)
 Procedure Name: Intubation Date/Time: 01/07/2024 9:26 AM  Performed by: Randa Evens, CRNAPre-anesthesia Checklist: Patient identified, Emergency Drugs available, Suction available and Patient being monitored Patient Re-evaluated:Patient Re-evaluated prior to induction Oxygen Delivery Method: Circle System Utilized Preoxygenation: Pre-oxygenation with 100% oxygen Induction Type: IV induction Ventilation: Mask ventilation without difficulty Grade View: Grade I Tube type: Oral Tube size: 7.5 mm Number of attempts: 1 Airway Equipment and Method: Stylet and Oral airway Placement Confirmation: ETT inserted through vocal cords under direct vision, positive ETCO2 and breath sounds checked- equal and bilateral Secured at: 23 cm Tube secured with: Tape Dental Injury: Teeth and Oropharynx as per pre-operative assessment

## 2024-01-07 NOTE — Progress Notes (Signed)
 PHARMACY - TOTAL PARENTERAL NUTRITION CONSULT NOTE   Indication: EC Fistula   Patient Measurements: Height: 5\' 10"  (177.8 cm) Weight: 80.2 kg (176 lb 12.9 oz) IBW/kg (Calculated) : 73 TPN AdjBW (KG): 81.5 Body mass index is 25.37 kg/m. Usual Weight:   Assessment:  52 yoM w/ hx DM2, PE on Xarelto, stage II rectal cancer s/p chemoradiation. Underwent low anterior & abdominoperineal resections with ileostomy in 2022, redo LAR in 2024, ultimately end colostomy placement March 2025; admitted 3/28 for sacrococcygeal ulceration with necrosis & bleeding. Prior to admission, patient did not have issues tolerating solid foods and was able to take some solid food while on soft diet inpatient. Notable weight loss of 7% over 1 month. Pharmacy consulted to manage TPN for nutrition support on 4/2 (admission day 6) for EC fistula. While fistula output is not high, has chronic pelvic abscess with rectal stump disruption and ongoing leaking. 4/10 in OR abscess interrogated and drained into ostomy, underwent debridement of perineal wound.  4/8 recalculated TPN goal rate per RD recommendation, see below for macros  Glucose / Insulin: T2DM, A1c 7.5; CBGs 75-103 (serum 68) with 85u in TPN 4/9 Planned increase 80 to 87 units insulin in TPN however failed to add upon re-order with adjusted kcal/protein goals.  4/10 unexpected response as patient was previously 107-178 with 80u in TPN Electrolytes: 4/10: lytes within goal range, Na improved 131 > 135, others stable Renal: Scr 0.71, BUN 23 Hepatic: 4/10 LFTs WNL, AlkPhos 137, albumin <1.5 I/O: 2.1 mL/kg/h UOP, 450 mL out in drain  GI Imaging:  4/1 CT A/P: combination of oral contrast and air between bowel and sacrococcygeal wound suggesting combination of perforation/fistulization and necrotic infection GI Surgeries / Procedures:  3/28 EUA for bleeding from perineal wound 4/2: debridement and wound vac placement 4/4  wound exploration, VAC dressing change 4/7  repeat dressing change  Central access: PICC ordered for TPN 4/2 TPN start date: 12/30/2023  Nutritional Goals: Initial goal rate 85 mL/hr (provides 108 g of protein and 2085 kcals per day) 4/8 Updated goal rate 90 mL/h provides 123 g protein and 2132 kcal   RD Assessment: Last updated 4/8 Estimated Needs Total Energy Estimated Needs: 2100-2350 kcals Total Protein Estimated Needs: 120-135 grams Total Fluid Estimated Needs: >/= 2.1L   Current Nutrition:  TPN + NPO 4/6 resource breeze TID; Per RN, patient refused due to sugar content 4/9 Did not tolerate trial of Juven due to taste  Plan:  Continue TPN at recalculated goal rate of 90 mL/hr;  Electrolytes in TPN: Na 150 mEq/L, K 45 mEq/L, Ca 5 mEq/L, Mg to 8 mEq/L, Phos 15 mmol/L, Cl:Ac ratio - 1:1 Add standard MVI and trace elements to TPN Chromium on hold d/t national shortage Decrease to sensitive SSI at 1800, increase back to q4h checks Decrease to 70 units insulin in TPN  D10W at 30 mL/h with current TPN, stop at 1800 Monitor TPN labs on Mon/Thurs, PRN; next labs 4/11 AM  Rutherford Nail, PharmD PGY2 Critical Care Pharmacy Resident 01/07/2024 7:36 AM

## 2024-01-07 NOTE — Transfer of Care (Signed)
 Immediate Anesthesia Transfer of Care Note  Patient: Steven [name]  Procedure(s) Performed: IRRIGATION AND DEBRIDEMENT ABSCESS  Patient Location: PACU  Anesthesia Type:General  Level of Consciousness: alert , oriented, and drowsy  Airway & Oxygen Therapy: Patient Spontanous Breathing  Post-op Assessment: Report given to RN  Post vital signs: Reviewed and stable  Last Vitals:  Vitals Value Taken Time  BP 130/71 01/07/24 1046  Temp    Pulse 94 01/07/24 1053  Resp 11 01/07/24 1053  SpO2 100 % 01/07/24 1053  Vitals shown include unfiled device data.  Last Pain:  Vitals:   01/07/24 0800  TempSrc: Temporal  PainSc: 2       Patients Stated Pain Goal: 5 (01/07/24 0800)  Complications: No notable events documented.

## 2024-01-07 NOTE — Anesthesia Postprocedure Evaluation (Signed)
 Anesthesia Post Note  Patient: Steven [name]  Procedure(s) Performed: IRRIGATION AND DEBRIDEMENT WOUND; WOUND VAC REPLACEMENT     Patient location during evaluation: PACU Anesthesia Type: General Level of consciousness: awake and alert Pain management: pain level controlled Vital Signs Assessment: post-procedure vital signs reviewed and stable Respiratory status: spontaneous breathing, nonlabored ventilation, respiratory function stable and patient connected to nasal cannula oxygen Cardiovascular status: blood pressure returned to baseline and stable Postop Assessment: no apparent nausea or vomiting Anesthetic complications: no   No notable events documented.  Last Vitals:  Vitals:   01/07/24 0800 01/07/24 0845  BP: 104/60 120/63  Pulse: 98 97  Resp: 15 20  Temp: 37.6 C   SpO2: 95% 95%    Last Pain:  Vitals:   01/07/24 0800  TempSrc: Temporal  PainSc: 2                  Kennieth Rad

## 2024-01-08 ENCOUNTER — Encounter (HOSPITAL_COMMUNITY): Payer: Self-pay | Admitting: General Surgery

## 2024-01-08 DIAGNOSIS — F419 Anxiety disorder, unspecified: Secondary | ICD-10-CM

## 2024-01-08 LAB — GLUCOSE, CAPILLARY
Glucose-Capillary: 87 mg/dL (ref 70–99)
Glucose-Capillary: 63 mg/dL — ABNORMAL LOW (ref 70–99)
Glucose-Capillary: 78 mg/dL (ref 70–99)
Glucose-Capillary: 103 mg/dL — ABNORMAL HIGH (ref 70–99)
Glucose-Capillary: 132 mg/dL — ABNORMAL HIGH (ref 70–99)
Glucose-Capillary: 120 mg/dL — ABNORMAL HIGH (ref 70–99)
Glucose-Capillary: 131 mg/dL — ABNORMAL HIGH (ref 70–99)

## 2024-01-08 LAB — BASIC METABOLIC PANEL WITH GFR
Anion gap: 6 (ref 5–15)
BUN: 23 mg/dL — ABNORMAL HIGH (ref 6–20)
CO2: 25 mmol/L (ref 22–32)
Calcium: 7.6 mg/dL — ABNORMAL LOW (ref 8.9–10.3)
Chloride: 102 mmol/L (ref 98–111)
Creatinine, Ser: 0.57 mg/dL — ABNORMAL LOW (ref 0.61–1.24)
GFR, Estimated: >60 mL/min (ref >60)
Glucose, Bld: 81 mg/dL (ref 70–99)
Potassium: 3.8 mmol/L (ref 3.5–5.1)
Sodium: 133 mmol/L — ABNORMAL LOW (ref 135–145)

## 2024-01-08 LAB — CBC
HCT: 24.4 % — ABNORMAL LOW (ref 39.0–52.0)
Hemoglobin: 7.8 g/dL — ABNORMAL LOW (ref 13.0–17.0)
MCH: 27 pg (ref 26.0–34.0)
MCHC: 32 g/dL (ref 30.0–36.0)
MCV: 84.4 fL (ref 80.0–100.0)
Platelets: 447 10*3/uL — ABNORMAL HIGH (ref 150–400)
RBC: 2.89 MIL/uL — ABNORMAL LOW (ref 4.22–5.81)
RDW: 19.4 % — ABNORMAL HIGH (ref 11.5–15.5)
WBC: 8.8 10*3/uL (ref 4.0–10.5)
nRBC: 0 % (ref 0.0–0.2)

## 2024-01-08 MED ORDER — LACTATED RINGERS IV SOLN: INTRAVENOUS | Status: AC

## 2024-01-08 MED ORDER — TRAVASOL 10 % IV SOLN
INTRAVENOUS | Status: AC
  Filled 2024-01-08: qty 1231.2

## 2024-01-08 MED ORDER — POTASSIUM CHLORIDE 10 MEQ/100ML IV SOLN: 10.0000 meq | INTRAVENOUS | Status: DC

## 2024-01-08 MED ORDER — POTASSIUM CHLORIDE 10 MEQ/50ML IV SOLN
10.0000 meq | INTRAVENOUS | Status: AC
  Administered 2024-01-08 (×2): 10 meq via INTRAVENOUS
  Filled 2024-01-08 (×2): qty 50

## 2024-01-08 MED ORDER — CLONAZEPAM 0.5 MG PO TABS: 0.5000 mg | ORAL_TABLET | Freq: Two times a day (BID) | ORAL | Status: DC

## 2024-01-08 MED ORDER — OXYCODONE HCL 5 MG PO TABS
10.0000 mg | ORAL_TABLET | ORAL | Status: DC | PRN
  Administered 2024-01-08: 10 mg via ORAL
  Administered 2024-01-09 (×2): 15 mg via ORAL
  Administered 2024-01-09: 10 mg via ORAL
  Administered 2024-01-09 – 2024-01-13 (×8): 15 mg via ORAL
  Filled 2024-01-08 (×3): qty 3
  Filled 2024-01-08: qty 2
  Filled 2024-01-08 (×2): qty 3
  Filled 2024-01-08: qty 2
  Filled 2024-01-08 (×5): qty 3

## 2024-01-08 MED ORDER — DICLOFENAC SODIUM 1 % EX GEL
2.0000 g | Freq: Four times a day (QID) | CUTANEOUS | Status: DC
  Administered 2024-01-08: 2 g via TOPICAL
  Filled 2024-01-08: qty 100

## 2024-01-08 MED ORDER — POTASSIUM CHLORIDE 20 MEQ PO PACK: 20.0000 meq | PACK | ORAL | Status: AC

## 2024-01-08 MED ORDER — VITAMIN C 500 MG PO TABS
500.0000 mg | ORAL_TABLET | Freq: Two times a day (BID) | ORAL | Status: DC
  Administered 2024-01-09 – 2024-01-13 (×9): 500 mg via ORAL
  Filled 2024-01-08 (×9): qty 1

## 2024-01-08 MED ORDER — CLONAZEPAM 0.25 MG PO TBDP
0.2500 mg | ORAL_TABLET | Freq: Two times a day (BID) | ORAL | Status: DC
  Administered 2024-01-08 – 2024-01-13 (×10): 0.25 mg via ORAL
  Filled 2024-01-08 (×10): qty 2

## 2024-01-08 MED ORDER — ZINC SULFATE 220 (50 ZN) MG PO CAPS
220.0000 mg | ORAL_CAPSULE | Freq: Every day | ORAL | Status: DC
  Administered 2024-01-09 – 2024-01-13 (×5): 220 mg via ORAL
  Filled 2024-01-08 (×5): qty 1

## 2024-01-08 MED ORDER — POTASSIUM CHLORIDE 20 MEQ PO PACK
20.0000 meq | PACK | ORAL | Status: DC
Start: 2024-01-08 — End: 2024-01-08

## 2024-01-08 NOTE — Plan of Care (Signed)
  Problem: Health Behavior/Discharge Planning: Goal: Ability to identify and utilize available resources and services will improve Outcome: Progressing   Problem: Metabolic: Goal: Ability to maintain appropriate glucose levels will improve Outcome: Progressing   Problem: Coping: Goal: Ability to adjust to condition or change in health will improve Outcome: Not Progressing   Problem: Fluid Volume: Goal: Ability to maintain a balanced intake and output will improve Outcome: Not Progressing   Problem: Health Behavior/Discharge Planning: Goal: Ability to manage health-related needs will improve Outcome: Not Progressing

## 2024-01-08 NOTE — Progress Notes (Signed)
 Patient hypoglycemic, requesting apple juice and mango ice to bring up CBG, will recheck CBG

## 2024-01-08 NOTE — Progress Notes (Signed)
   1 Day Post-Op Subjective: Met with Steven [name], his wife, and family friend. Reviewed his case and answered questions as they pertained to Urology. Pt was emotional through most of the visit, but expressed understanding. Stable with good UOP  Objective: Vital signs in last 24 hours: Temp:  [98.2 F (36.8 C)-100.1 F (37.8 C)] 98.8 F (37.1 C) (04/11 0800) Pulse Rate:  [86-102] 97 (04/11 1100) Resp:  [11-17] 16 (04/11 1100) BP: (94-123)/(38-59) 123/54 (04/11 1000) SpO2:  [93 %-99 %] 98 % (04/11 1100) Weight:  [82.2 kg] 82.2 kg (04/11 0431)  Assessment/Plan: #bladder cystotomy  Urology assisted with general surgery by closing inadvertent cystotomy developed while irrigating/exploring peritoneal abscess.  Questionable probability of healing considering radiation changes.  Keep Foley catheter in place for the time being and after patient has had several weeks to heal can consider CT cystogram on an outpatient basis.  If no extravasation will remove Foley at that time.   If Foley catheter fails or fistulous track reopens, would need to place bilateral percutaneous nephrostomy tubes. Will continue to follow peripherally.  I will see him at the beginning of the week.  Please call with questions, concerns, or acute changes over the weekend.  Intake/Output from previous day: 04/10 0701 - 04/11 0700 In: 2973.9 [P.O.:240; I.V.:2619.3; IV Piggyback:114.6] Out: 1990 [Urine:1600; Drains:350; Blood:40]  Intake/Output this shift: Total I/O In: 587.3 [I.V.:561; IV Piggyback:26.3] Out: 250 [Drains:150; Stool:100]  Physical Exam:  General: Alert and oriented CV: No cyanosis Lungs: equal chest rise Gu: Foley catheter in place draining clear yellow urine  Lab Results: Recent Labs    01/06/24 0500 01/07/24 0626 01/08/24 0500  HGB 8.4* 8.8* 7.8*  HCT 26.5* 27.3* 24.4*   BMET Recent Labs    01/07/24 0626 01/08/24 0500  NA 135 133*  K 4.3 3.8  CL 103 102  CO2 25 25  GLUCOSE 68* 81  BUN  23* 23*  CREATININE 0.71 0.57*  CALCIUM 7.7* 7.6*     Studies/Results: No results found.    LOS: 14 days   Elmon Kirschner, NP [facility] Pager: [phone]  01/08/2024, 12:26 PM

## 2024-01-08 NOTE — Progress Notes (Signed)
 Daily Progress Note   Patient Name: Steven [name]       Date: 01/08/2024 DOB: [birth year]  Age: 53 y.o. MRN#: [id number] Attending Physician: Bishop Limbo, MD Primary Care Physician: Noberto Retort, MD Admit Date: 12/25/2023 Length of Stay: 14 days  Reason for Consultation/Follow-up: Pain control  Subjective:   CC: Patient laying in bed anxious.  Following up regarding pain control.  Subjective:  Reviewed EMR prior to presenting to bedside.  Time of EMR review of past 24 hours patient has received as needed IV Dilaudid 2 mg x 4 doses, as needed oxycodone 10 mg x 2 doses, and as needed oxycodone 15 mg x 1 dose.  IV doses of Dilaudid for adjusted yesterday when patient went for procedure.  Patient also received as needed Ativan 0.5 mg x 2 doses.  Patient continues to receive MS Contin 15 mg every 8 hours scheduled.  Reviewed surgery and urology notes regarding patient's care.  Presented to bedside to see patient.  Patient initially laying in bed without family present at bedside though wife and friend quickly joined.  Patient noted he is not feeling great today after had a visit with the urologist this morning.  Patient and wife heard a plan to continue Foley management though if this fails, would need bilateral nephrostomy tubes.  Spent time providing emotional support and empathized with difficult situation.  Wife noted she inquired with urologist if patient can "survived".  They heard that from a urology perspective patient can survive even if with bilateral nephrostomy tubes though urologist could not comment about survival from surgical standpoint with his wound.  Wife noted wanting to discuss this with surgery team as she needs straightforward answers to help plan accordingly and support the patient moving forward.  They have 3 children together and she would also need to prepare them.  Spent time discussing that should patient continue on this aggressive medical pathway he is in for a prolonged  road that could be filled with complications.  Expressed concern that at this point the patient's albumin is still low, concerned about wound healing.  Wife and patient hoping that once patient goes to OR on Monday there could be good news about him potentially having healing so he could start an oral diet to improve his protein.  Acknowledged hope for this. With permission, able to express that the medical team's will continue to perform aggressive medical interventions as long as patient feels it is benefiting his care.  With permission, did express that should patient ever feel there is a point where he feels that medical interventions are no longer meeting his requirements for quality of life and goals for medical care moving forward, patient can elect to discontinue aggressive medical interventions.  Expressed that should that be pursued, would expect his time to be growing short. Wife inquired about ACP documentation for healthcare power of attorney.  Expressed that should patient not be able to speak for himself, since patient and wife are married, in [place] medical decision making would fall to wife.  Discussed there are other parts of ACP documentation the need to be considered by patient and wife while patient is easily able to discuss them.  Wife and patient acknowledged this.  Noted patient and wife would inform surgical team about their questions and wishes to have frank discussions about care moving forward.  Also spent time addressing patient's symptom burden.  At this time patient does feel the IV Dilaudid is assisting with his pain management.  Patient is using as needed oxycodone though feels can wear off.  Discussed can allow frequency to be adjusted to oxycodone every 3 hours as needed.  Encourage patient to use oxycodone to have more long lasting pain relief than the IV medications.  Should patient continue to need regular doses of as needed oxycodone, could potentially increase  long-acting morphine.  Patient agreement with that plan.  Also discussed with patient's uncontrolled anxiety, going to add longer-lasting medication called Klonopin.  Discussed continuing as needed Ativan for breakthrough though will start Klonopin this evening.  Patient and wife are agreement with this plan.  Spent time answering questions as able.  Noted palliative medicine team continue to follow along with patient's medical journey.  Discussed care with RN and surgical team to coordinate care.  Objective:   Vital Signs:  BP (!) 118/56 (BP Location: Left Arm)   Pulse 90   Temp 98.2 F (36.8 C) (Oral)   Resp 12   Ht 5\' 10"  (1.778 m)   Wt 82.2 kg   SpO2 95%   BMI 26.00 kg/m   Physical Exam: General: Awake, pale, ill-appearing, appears anxious Cardiovascular: RRR Respiratory: no increased work of breathing noted, not in respiratory distress Neuro: A&Ox4 Psych: Appears anxious  Imaging: I personally reviewed recent imaging.   Assessment & Plan:   Assessment: Patient is a 53 year old male with a past medical history of diabetes mellitus type 2, VTE on Xarelto, stage II rectal adenocarcinoma status post neoadjuvant concurrent chemoradiation in 2022 followed by a low anterior resection with a complicated postoperative course including need for ostomy with chronic pelvic abscess who was admitted on 12/25/2023 under general surgery service for management of perirectal wound bleeding. During hospitalization, patient has required multiple transfusions for acute blood loss anemia secondary to perineal wound. 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 Palliative medicine team consulted to assist with pain management.   Recommendations/Plan:  # Complex medical decision making/goals of care  - Patient continuing with appropriate medical interventions at this time.  Patient and  wife inquired with urologist if patient could "survive this".  Patient and wife wanting to discuss this further with surgical team.  With permission did express concern about patient's low protein status and how this can affect wound healing.   Informed surgical team of patient and wife wanting to have these discussions for follow-up.  Continuing aggressive medical interventions at this time.  Palliative medicine team will continue to follow along and engage in conversations as able and appropriate. -Wife also inquiring about ACP/HCPOA documentation.  Noted that since they are legally married, in the state of [place] should patient not be able to make medical decisions for himself, his wife would be his Museum/gallery exhibitions officer.  Discussed patient and wife needing to have further conversations regarding what medical care would and would not be tolerable to the patient including interventions such as cardiac resuscitation and intubation with mechanical ventilation for life support measures.     Code Status: Full Code  # Symptom management: Patient is receiving these palliative interventions for symptom management with an intent to improve quality of life.                 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   Patient now with worsening pain in setting  of procedure this morning as detailed above in HPI.                               -Continue MS Contin 15 mg every 8 hours during the day.  Will continue to increase slowly as tolerated by patient.                               - Change oxycodone 10-15 mg to every 3 hours as needed                               - Continue IV hydromorphone to 2 mg every 2 hours as needed                               - Continue Tylenol to 1000 mg tab every 8 hours                  - Anxiety                               - Continue IV Ativan 0.5 mg every 6  hours as needed    - Start Klonopin 0.25 mg twice daily beginning evening of 01/08/2024  # Psycho-social/Spiritual Support:  - Support System: Wife  # Discharge Planning: To Be Determined  Discussed with: Patient, patient's wife, RN, surgery team  Thank you for allowing the palliative care team to participate in the care [name].  Alvester Morin, DO Palliative Care Provider PMT # [phone]  If patient remains symptomatic despite maximum doses, please call PMT at [phone] between 0700 and 1900. Outside of these hours, please call attending, as PMT does not have night coverage.   Personally spent 65 minutes in patient care including extensive chart review (labs, imaging, progress/consult notes, vital signs), medically appropraite exam, discussed with treatment team, education to patient, family, and staff, documenting clinical information, medication review and management, coordination of care, and available advanced directive documents.

## 2024-01-08 NOTE — Progress Notes (Signed)
 1 Day Post-Op   Subjective/Chief Complaint: Overall good pain control today. Consistently sipping on clear liquids   Objective: Vital signs in last 24 hours: Temp:  [97.6 F (36.4 C)-100.1 F (37.8 C)] 98.2 F (36.8 C) (04/11 0400) Pulse Rate:  [90-102] 90 (04/11 0400) Resp:  [9-21] 12 (04/11 0400) BP: (94-137)/(38-74) 118/56 (04/11 0400) SpO2:  [93 %-100 %] 95 % (04/11 0400) Weight:  [82.2 kg] 82.2 kg (04/11 0431) Last BM Date : 01/06/24  Intake/Output from previous day: 04/10 0701 - 04/11 0700 In: 2973.9 [P.O.:240; I.V.:2619.3; IV Piggyback:114.6] Out: 1990 [Urine:1600; Drains:350; Blood:40] Intake/Output this shift: No intake/output data recorded.  General appearance: alert, cooperative, and no distress Resp: breathing comfortably Cardio: regular rate and rhythm GI: soft, non distended, ostomy with gas and small amount of liquid stool. Right lateral abdomen tracking from under colostomy device at 9 o clock there is less erythema and fluctuance. Vac cannister with bloody output. MSK: bilateral feet edematous. No calf TTP    Lab Results:  Recent Labs    01/07/24 0626 01/08/24 0500  WBC 9.7 8.8  HGB 8.8* 7.8*  HCT 27.3* 24.4*  PLT 513* 447*   BMET Recent Labs    01/07/24 0626 01/08/24 0500  NA 135 133*  K 4.3 3.8  CL 103 102  CO2 25 25  GLUCOSE 68* 81  BUN 23* 23*  CREATININE 0.71 0.57*  CALCIUM 7.7* 7.6*   PT/INR No results for input(s): "LABPROT", "INR" in the last 72 hours. ABG No results for input(s): "PHART", "HCO3" in the last 72 hours.  Invalid input(s): "PCO2", "PO2"  Studies/Results: No results found.  Anti-infectives: Anti-infectives (From admission, onward)    Start     Dose/Rate Route Frequency Ordered Stop   01/04/24 1400  piperacillin-tazobactam (ZOSYN) IVPB 3.375 g       Note to Pharmacy: Pharmacy to check dosing >> approved by [facility]   3.375 g 12.5 mL/hr over 240 Minutes Intravenous Every 8 hours 01/04/24 1257     12/25/23 1800   piperacillin-tazobactam (ZOSYN) IVPB 3.375 g  Status:  Discontinued       Note to Pharmacy: Pharmacy to check dosing >> approved by [facility]   3.375 g 12.5 mL/hr over 240 Minutes Intravenous Every 8 hours 12/25/23 1713 01/04/24 1257       Assessment/Plan: HD#14 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 Intra-op urology c/s Dr. Arita Miss. S/p closure of bladder cystotomy/fistula 4/10 Vac change yesterday with intra-op finding of fistula from bladder to wound. No small bowel succus. Plan next vac change Monday Continue foley. UOP 1600 ml/24. Per urology cont foley as long as it is draining well. If stops draining or fistula reopens consider b/l perc neph Monitor abdominal wall abscess near stoma Hgb stable at 7.8 (8.8 preop). Monitor bp and recheck am Continue IV Zosyn (3/28>>) Okay for CLD. Pending re eval in OR Monday may be able to advance diet TNA, PICC line Nystatin for rash lower abdomen/groin. Discussed options for voiding management and will use purewick Palliative consulted to assist with pain and anxiety and adjustments made - appreciate their help   FEN: CLD, TPN ID: zosyn 3/28> VTE: SCDs - chemical ppx and xarelto held in setting of anemia   DM with hyperglycemia - on insulin ABL anemia with anemia of chronic disease- as  above - repeat hgb this afternoon History of VTE on xarelto at baseline. Hold anticoag  LOS: 14 days    Eric Form, [facility] 01/08/2024, 9:27 AM Please see Amion for pager number during day hours 7:00am-4:30pm

## 2024-01-08 NOTE — Progress Notes (Addendum)
 PHARMACY - TOTAL PARENTERAL NUTRITION CONSULT NOTE   Indication: EC Fistula   Patient Measurements: Height: 5\' 10"  (177.8 cm) Weight: 82.2 kg (181 lb 3.5 oz) IBW/kg (Calculated) : 73 TPN AdjBW (KG): 80.2 Body mass index is 26 kg/m. Usual Weight:   Assessment:  52 yoM w/ hx DM2, PE on Xarelto, stage II rectal cancer s/p chemoradiation. Underwent low anterior & abdominoperineal resections with ileostomy in 2022, redo LAR in 2024, ultimately end colostomy placement March 2025; admitted 3/28 for sacrococcygeal ulceration with necrosis & bleeding. Prior to admission, patient did not have issues tolerating solid foods and was able to take some solid food while on soft diet inpatient. Notable weight loss of 7% over 1 month. Pharmacy consulted to manage TPN for nutrition support on 4/2 (admission day 6) for EC fistula. While fistula output is not high, has chronic pelvic abscess with rectal stump disruption and ongoing leaking. 4/10 in OR abscess interrogated and drained into ostomy, underwent debridement of perineal wound.  4/8 recalculated TPN goal rate per RD recommendation, see below for macros  Glucose / Insulin: T2DM, A1c 7.5;  4/11: CBGs 63-99 with 70 u insulin in TPN; patient requested mango ice/apple juice for CBG 63 4/10 unexpected response with CBGs 91-103 as patient was previously 107-178 with 80u in TPN, initiated D10W to run at 30 mL/h  4/9 Semglee 40 & SSI given outside of TPN, failed to include in TPN upon re-order for adjusted RD recommendations Electrolytes: Na decreased 135 to 133 (max in TPN), K 4.3 to 3.8, CoCa 9.2 Renal: Scr 0.57, BUN 23 Hepatic: 4/10 LFTs WNL, AlkPhos 137, albumin <1.5 I/O: 0.8 mL/kg/h UOP, 350 mL out in drain  GI Imaging:  4/1 CT A/P: combination of oral contrast and air between bowel and sacrococcygeal wound suggesting combination of perforation/fistulization and necrotic infection GI Surgeries / Procedures:  3/28 EUA for bleeding from perineal  wound 4/2: debridement and wound vac placement 4/4  wound exploration, VAC dressing change 4/7 repeat dressing change 4/10 I&D of perineal wound, abdominal wall abscess  Central access: PICC ordered for TPN 4/2 TPN start date: 12/30/2023  Nutritional Goals: Initial goal rate 85 mL/hr (provides 108 g of protein and 2085 kcals per day) 4/8 Updated goal rate 90 mL/h provides 123 g protein and 2132 kcal   RD Assessment: Last updated 4/8 Estimated Needs Total Energy Estimated Needs: 2100-2350 kcals Total Protein Estimated Needs: 120-135 grams Total Fluid Estimated Needs: >/= 2.1L   Current Nutrition:  TPN + CLD (4/10) 4/6 resource breeze TID; Per RN, patient refused due to sugar content 4/9 Did not tolerate trial of Juven due to taste  Plan:  Continue TPN at recalculated goal rate of 90 mL/hr;  Electrolytes in TPN: Na 150 mEq/L, K 45 mEq/L, Ca 5 mEq/L, Mg to 8 mEq/L, Phos 15 mmol/L, Cl:Ac ratio - 1:1 Supplement 20 mEq K PO outside of TPN Add standard MVI and trace elements to TPN Chromium on hold d/t national shortage Continue sensitive SSI q4h, discontinue once stable CBGs 100 - 180  Decrease to 50 units insulin in TPN  Monitor TPN labs on Mon/Thurs, PRN F/u advancement of diet and ostomy output  Rutherford Nail, PharmD PGY2 Critical Care Pharmacy Resident 01/08/2024 7:52 AM

## 2024-01-08 NOTE — Plan of Care (Signed)
  Problem: Health Behavior/Discharge Planning: Goal: Ability to manage health-related needs will improve Outcome: Progressing   Problem: Nutritional: Goal: Maintenance of adequate nutrition will improve Outcome: Progressing   Problem: Nutrition: Goal: Adequate nutrition will be maintained Outcome: Progressing   Problem: Pain Managment: Goal: General experience of comfort will improve and/or be controlled Outcome: Progressing   Problem: Skin Integrity: Goal: Risk for impaired skin integrity will decrease Outcome: Not Progressing   Problem: Coping: Goal: Level of anxiety will decrease Outcome: Not Progressing

## 2024-01-08 NOTE — Progress Notes (Signed)
 Nutrition Follow-up  DOCUMENTATION CODES:      INTERVENTION:  - Continue TPN.              - TPN management per pharmacy.    - Diet per [name]. Remains on a Clear Liquid diet at this time.  - Discontinue Juven, patient does not like and is not consuming.             - Recommend adding Ensure Max once daily and Glucerna BID once diet advanced past clear liquids.   - Add 500mg  vitamin C BID and 220mg  zinc x14 days to support wound healing. Patient also receiving MVI in TPN.   - Monitor weight trends.   NUTRITION DIAGNOSIS:   Increased nutrient needs related to wound healing as evidenced by estimated needs. *ongoing  GOAL:   Patient will meet greater than or equal to 90% of their needs *met with TPN  MONITOR:    (TPN)  REASON FOR ASSESSMENT:   Consult New TPN/TNA  ASSESSMENT:   53 year old male with history of diabetes type 2, VTE on Xarelto, stage II rectal adenocarcinoma status post neoadjuvant concurrent chemoradiation in 2022 followed by low anterior resection complicated postoperative course with a chronic pelvic abscess was admitted under general surgery service after he presented with perirectal wound bleeding.  3/28: admitted, NPO->CLD->NPO 3/29: FLD 3/30: Soft diet 3/31: Heart Healthy/CHO modified 4/1: NPO->CLD 4/2: OR for I&D of sacral wound with wound vac placement; NPO; TPN initiated 4/4: OR for wound exploration and VAC dressing change; CLD 4/7: OR for I&D or wound (20x15 cm), wound vac replacement 4/10: OR for wound vac change, found to have bladder cystotomy with fistula s/p closure   Patient reports taking in some of what comes on his CLD trays but notes a lot of the items have a lot of sugar. Has also grown tired of the available menu options. Wife brought in some different flavors of things like Gatorade and Jello. There is no meal intake documentation to determine accurate daily PO intake.  Patient does not like the Juven packets and does not want to  receive any. He does drink Ensure Max and Glucerna at home so agreeable to receive these once his diet is advanced further.   For now, patient to remains on full strength TPN to meet 100% of estimated needs. Will add vitamin C and zinc supplements to support wound healing. Patient continues to receive a MVI in TPN.    Admit weight: 188# Current weight: 181# I&O's: -3.1L since admit + for moderate pitting RLE/LLE edema Per weight records, pt lost 15 lbs from 3/5 to 3/30 (8% wt loss x <1 month, significant for time frame).     Medications reviewed and include: -   Labs reviewed:  Na 133 HA1C 7.5 Blood Glucose 63-103 x24 hours   Diet Order:   Diet Order             Diet clear liquid Room service appropriate? Yes; Fluid consistency: Thin  Diet effective now                   EDUCATION NEEDS:  No education needs have been identified at this time  Skin:  Skin Assessment: Skin Integrity Issues: Skin Integrity Issues:: Incisions Incisions: 3/28 rectum  Last BM:  4/9 - colostomy  Height:  Ht Readings from Last 1 Encounters:  01/07/24 5\' 10"  (1.778 m)   Weight:  Wt Readings from Last 1 Encounters:  01/08/24 82.2 kg   BMI:  Body mass index is 26 kg/m.  Estimated Nutritional Needs:  Kcal:  2100-2350 kcals Protein:  120-135 grams Fluid:  >/= 2.1L    Shelle Iron RD, LDN Contact via Secure Chat.

## 2024-01-09 DIAGNOSIS — L988 Other specified disorders of the skin and subcutaneous tissue: Secondary | ICD-10-CM

## 2024-01-09 DIAGNOSIS — Z85048 Personal history of other malignant neoplasm of rectum, rectosigmoid junction, and anus: Secondary | ICD-10-CM

## 2024-01-09 LAB — GLUCOSE, CAPILLARY
Glucose-Capillary: 126 mg/dL — ABNORMAL HIGH (ref 70–99)
Glucose-Capillary: 126 mg/dL — ABNORMAL HIGH (ref 70–99)
Glucose-Capillary: 129 mg/dL — ABNORMAL HIGH (ref 70–99)
Glucose-Capillary: 134 mg/dL — ABNORMAL HIGH (ref 70–99)
Glucose-Capillary: 156 mg/dL — ABNORMAL HIGH (ref 70–99)
Glucose-Capillary: 227 mg/dL — ABNORMAL HIGH (ref 70–99)

## 2024-01-09 MED ORDER — FUROSEMIDE 10 MG/ML IJ SOLN
40.0000 mg | Freq: Once | INTRAMUSCULAR | Status: AC
  Administered 2024-01-09: 40 mg via INTRAVENOUS
  Filled 2024-01-09: qty 4

## 2024-01-09 MED ORDER — FERROUS SULFATE 325 (65 FE) MG PO TABS
325.0000 mg | ORAL_TABLET | Freq: Two times a day (BID) | ORAL | Status: DC
  Administered 2024-01-09 – 2024-01-13 (×9): 325 mg via ORAL
  Filled 2024-01-09 (×9): qty 1

## 2024-01-09 MED ORDER — CALCIUM POLYCARBOPHIL 625 MG PO TABS
625.0000 mg | ORAL_TABLET | Freq: Two times a day (BID) | ORAL | Status: DC
  Administered 2024-01-09 – 2024-01-13 (×7): 625 mg via ORAL
  Filled 2024-01-09 (×8): qty 1

## 2024-01-09 MED ORDER — TRAVASOL 10 % IV SOLN
INTRAVENOUS | Status: AC
  Filled 2024-01-09: qty 1231.2

## 2024-01-09 MED ORDER — SODIUM CHLORIDE 0.9 % IV SOLN
100.0000 mg | Freq: Once | INTRAVENOUS | Status: AC
  Administered 2024-01-09: 100 mg via INTRAVENOUS
  Filled 2024-01-09: qty 5

## 2024-01-09 MED ORDER — MORPHINE SULFATE ER 15 MG PO TBCR
30.0000 mg | EXTENDED_RELEASE_TABLET | Freq: Three times a day (TID) | ORAL | Status: DC
  Administered 2024-01-09 – 2024-01-13 (×12): 30 mg via ORAL
  Filled 2024-01-09 (×12): qty 2

## 2024-01-09 MED ORDER — GLUCERNA SHAKE PO LIQD
237.0000 mL | Freq: Two times a day (BID) | ORAL | Status: DC
  Administered 2024-01-09 – 2024-01-10 (×4): 237 mL via ORAL
  Filled 2024-01-09 (×8): qty 237

## 2024-01-09 NOTE — Progress Notes (Signed)
 01/09/2024  Steven [name] [id number]  CARE TEAM: PCP: [name], MD  Outpatient Care Team: Patient Care Team: [name], MD as PCP - General (Family Medicine) Murleen Arms, MD as Consulting Physician (Hematology and Oncology) Joyce Nixon, MD as Consulting Physician (General Surgery) Causey, Laura Polio, NP as Nurse Practitioner (Hematology and Oncology) Johna Myers, MD as Consulting Physician (Radiation Oncology) Mansouraty, Albino Alu., MD as Consulting Physician (Gastroenterology) Roxane Copp, MD as Consulting Physician (Urology)  Inpatient Treatment Team: Treatment Team:  [place], [place], MD [place], Md, MD Joyce Nixon, MD Catheline Clos, RN Roxane Copp, MD Agatha Horsfall, RN Digna Fraise, NT Delpha Fickle, [facility] Small, Ramonia Burns, RN   Problem List:   Principal Problem:   Anemia due to acute blood loss Active Problems:   Post-operative hemoglobin drop   High risk medication use   Need for emotional support   Counseling and coordination of care   Medication management   Palliative care encounter   Disruption of perineal wound in male   Pain   01/07/2024  Rectal cancer with recurrences, chronic abscess, delayed fistulas & multiple wound issues: -rectal adenocarcinoma diagnosed 2022  -s/p chemoradiation with hx of LAR 2022 -redo LAR with colostomy 2024 -s/p completion APR 11/2023 with ongoing wound necrosis -S/p closure of bladder cystotomy/fistula 01/07/2024  Assessment [facility] Stay = 15 days) 2 Days Post-Op      Stabilizing but guarded    Assessment/Plan: HD#14 open infected perineal wound with fistulas  Delayed enterocutaneous fistula appears to have closed down with no more succus drainage.    Unfortunately now has urological fistula with Foley exposed status post Urology repair and VAC.  Vac change twice a week qMon/Thu  Continue foley. UOP 1600 ml/24. Per urology cont foley as long as it is  draining well. If stops draining or fistula reopens, then most likely will need more aggressive urinary diversion with bilateral PERC nephrostomy tubes.   Very edematous most likely due to severe malnutrition.  Try Lasix x 1 to diurese  Monitor abdominal wall abscess near stoma - decreased  Hgb stable at 7.8 (8.8 preop).  Will give IV iron since he is severely malnourished with unreliable digestive tract absorption.  Try and add some oral iron that may help constipate as well.  Monitor bp and recheck am  Continue IV Zosyn (3/28>>)  Full nutrition.  TPN for now.  Delayed intracutaneous perineal fistula seems to have resolved.  Continue VAC.  Tolerating clear liquid diet with no succus drainage and flatus and bowel movements colostomy.  Patient would like to try pudding.  Will advance to dysphagia 1/full liquid diet through the weekend and see how things go.  PICC line  Nystatin for rash lower abdomen/groin. Discussed options for voiding management and will use purewick  Palliative consulted to assist with pain and anxiety and adjustments made - appreciate their help   FEN: CLD, TPN  ID: zosyn 3/28>  VTE: SCDs - chemical ppx and xarelto held in setting of anemia   DM with hyperglycemia - on insulin  ABL anemia with anemia of chronic disease- as above.  Try to supplement IV and p.o. and follow.  If stabilized and improved, can consider reanticoagulating if no more surgery planned.  History of VTE on xarelto at baseline. Hold anticoag    I updated the patient's status to the patient and nurse  Recommendations were made.  Questions were answered.  They expressed understanding & appreciation.  -Disposition: To be  determined.  I believe Dr. Andy Bannister to return on Monday and can help resume lead in management of this extremely complex patient       I reviewed nursing notes, last 24 h vitals and pain scores, last 48 h intake and output, last 24 h labs and trends, and last 24 h imaging  results.  I have reviewed this patient's available data, including medical history, events of note, test results, etc as part of my evaluation.   A significant portion of that time was spent in counseling. Care during the described time interval was provided by me.  This care required high  level of medical decision making.  01/09/2024    Subjective: (Chief complaint)  Tolerating clear liquids.  Not nauseated.  Getting hungry.  Wants pudding.  No hemodynamic issues.  Wound VAC with intact seal.  No obvious succus drainage.  Colostomy more productive with flatus and stool.  Patient denies much pain.  Objective:  Vital signs:  Vitals:   01/09/24 0400 01/09/24 0406 01/09/24 0436 01/09/24 0641  BP: (!) 107/55     Pulse: 83     Resp: 14     Temp:   98 F (36.7 C) 98.6 F (37 C)  TempSrc:   Oral Oral  SpO2: 94%     Weight:  82 kg    Height:        Last BM Date : 01/08/24  Intake/Output   Yesterday:  04/11 0701 - 04/12 0700 In: 4482.2 [P.O.:480; I.V.:3757.2; IV Piggyback:245.1] Out: 4150 [Urine:3500; Drains:450; Stool:200] This shift:  No intake/output data recorded.  Bowel function:  Flatus: No  BM:  No  Drain: Serosanguinous   Physical Exam:  General: Pt awake/alert in no acute distress Eyes: PERRL, normal EOM.  Sclera clear.  No icterus Neuro: CN II-XII intact w/o focal sensory/motor deficits. Lymph: No head/neck/groin lymphadenopathy Psych:  No delerium/psychosis/paranoia.  Oriented x 4 HENT: Normocephalic, Mucus membranes moist.  No thrush Neck: Supple, No tracheal deviation.  No obvious thyromegaly Chest: No pain to chest wall compression.  Good respiratory excursion.  No audible wheezing CV:  Pulses intact.  Regular rhythm.  No major extremity edema MS: Normal AROM mjr joints.  No obvious deformity  Abdomen: Soft.  Nondistended.  Nontender.  Colostomy with gas and stool in bag.  Minimal cellulitis/erythema.  No evidence of peritonitis.  No  incarcerated hernias.  Catheter in place.  Perineal wound VAC without any frank feculent/succus drainage  Ext:   No deformity.  3+ edema.  No cyanosis Skin: No petechiae / purpurea.  No major sores.  Warm and dry    Results:   Cultures: Recent Results (from the past 720 hours)  MRSA Next Gen by PCR, Nasal     Status: None   Collection Time: 12/16/23  9:31 AM   Specimen: Nasal Mucosa; Nasal Swab  Result Value Ref Range Status   MRSA by PCR Next Gen NOT DETECTED NOT DETECTED Final    Comment: (NOTE) The GeneXpert MRSA Assay (FDA approved for NASAL specimens only), is one component of a comprehensive MRSA colonization surveillance program. It is not intended to diagnose MRSA infection nor to guide or monitor treatment for MRSA infections. Test performance is not FDA approved in patients less than 2 years old. Performed at [facility], [address], [place], [place] [address]   MRSA Next Gen by PCR, Nasal     Status: None   Collection Time: 12/25/23  4:38 PM   Specimen: Nasal  Mucosa; Nasal Swab  Result Value Ref Range Status   MRSA by PCR Next Gen NOT DETECTED NOT DETECTED Final    Comment: (NOTE) The GeneXpert MRSA Assay (FDA approved for NASAL specimens only), is one component of a comprehensive MRSA colonization surveillance program. It is not intended to diagnose MRSA infection nor to guide or monitor treatment for MRSA infections. Test performance is not FDA approved in patients less than 60 years old. Performed at [facility], [address] [place], [place] [address]     Labs: Results for orders placed or performed during the hospital encounter of 12/25/23 (from the past 48 hours)  Glucose, capillary     Status: None   Collection Time: 01/07/24  8:05 AM  Result Value Ref Range   Glucose-Capillary 75 70 - 99 mg/dL    Comment: Glucose reference range applies only to samples taken after fasting for at least 8 hours.   Glucose, capillary     Status: None   Collection Time: 01/07/24 10:50 AM  Result Value Ref Range   Glucose-Capillary 96 70 - 99 mg/dL    Comment: Glucose reference range applies only to samples taken after fasting for at least 8 hours.  Glucose, capillary     Status: None   Collection Time: 01/07/24  4:03 PM  Result Value Ref Range   Glucose-Capillary 82 70 - 99 mg/dL    Comment: Glucose reference range applies only to samples taken after fasting for at least 8 hours.   Comment 1 Notify RN    Comment 2 Document in Chart   Glucose, capillary     Status: None   Collection Time: 01/07/24  8:11 PM  Result Value Ref Range   Glucose-Capillary 99 70 - 99 mg/dL    Comment: Glucose reference range applies only to samples taken after fasting for at least 8 hours.  Glucose, capillary     Status: None   Collection Time: 01/08/24  1:09 AM  Result Value Ref Range   Glucose-Capillary 87 70 - 99 mg/dL    Comment: Glucose reference range applies only to samples taken after fasting for at least 8 hours.  Glucose, capillary     Status: Abnormal   Collection Time: 01/08/24  4:36 AM  Result Value Ref Range   Glucose-Capillary 63 (L) 70 - 99 mg/dL    Comment: Glucose reference range applies only to samples taken after fasting for at least 8 hours.  CBC     Status: Abnormal   Collection Time: 01/08/24  5:00 AM  Result Value Ref Range   WBC 8.8 4.0 - 10.5 K/uL   RBC 2.89 (L) 4.22 - 5.81 MIL/uL   Hemoglobin 7.8 (L) 13.0 - 17.0 g/dL   HCT 60.4 (L) 54.0 - 98.1 %   MCV 84.4 80.0 - 100.0 fL   MCH 27.0 26.0 - 34.0 pg   MCHC 32.0 30.0 - 36.0 g/dL   RDW 19.1 (H) 47.8 - 29.5 %   Platelets 447 (H) 150 - 400 K/uL   nRBC 0.0 0.0 - 0.2 %    Comment: Performed at [facility], [address] [place], [place] [address]  Basic metabolic panel with GFR     Status: Abnormal   Collection Time: 01/08/24  5:00 AM  Result Value Ref Range   Sodium 133 (L) 135 - 145 mmol/L   Potassium 3.8 3.5 -  5.1 mmol/L   Chloride 102 98 - 111 mmol/L   CO2 25 22 -  32 mmol/L   Glucose, Bld 81 70 - 99 mg/dL    Comment: Glucose reference range applies only to samples taken after fasting for at least 8 hours.   BUN 23 (H) 6 - 20 mg/dL   Creatinine, Ser 1.61 (L) 0.61 - 1.24 mg/dL   Calcium 7.6 (L) 8.9 - 10.3 mg/dL   GFR, Estimated >09 >60 mL/min    Comment: (NOTE) Calculated using the CKD-EPI Creatinine Equation (2021)    Anion gap 6 5 - 15    Comment: Performed at [facility], [address], [place], [place] [address]  Glucose, capillary     Status: None   Collection Time: 01/08/24  5:16 AM  Result Value Ref Range   Glucose-Capillary 78 70 - 99 mg/dL    Comment: Glucose reference range applies only to samples taken after fasting for at least 8 hours.  Glucose, capillary     Status: Abnormal   Collection Time: 01/08/24  7:43 AM  Result Value Ref Range   Glucose-Capillary 103 (H) 70 - 99 mg/dL    Comment: Glucose reference range applies only to samples taken after fasting for at least 8 hours.  Glucose, capillary     Status: Abnormal   Collection Time: 01/08/24 11:23 AM  Result Value Ref Range   Glucose-Capillary 132 (H) 70 - 99 mg/dL    Comment: Glucose reference range applies only to samples taken after fasting for at least 8 hours.  Glucose, capillary     Status: Abnormal   Collection Time: 01/08/24  5:01 PM  Result Value Ref Range   Glucose-Capillary 120 (H) 70 - 99 mg/dL    Comment: Glucose reference range applies only to samples taken after fasting for at least 8 hours.  Glucose, capillary     Status: Abnormal   Collection Time: 01/08/24  7:31 PM  Result Value Ref Range   Glucose-Capillary 131 (H) 70 - 99 mg/dL    Comment: Glucose reference range applies only to samples taken after fasting for at least 8 hours.   Comment 1 Notify RN    Comment 2 Document in Chart   Glucose, capillary     Status: Abnormal   Collection Time: 01/09/24 12:43 AM  Result Value Ref  Range   Glucose-Capillary 129 (H) 70 - 99 mg/dL    Comment: Glucose reference range applies only to samples taken after fasting for at least 8 hours.  Glucose, capillary     Status: Abnormal   Collection Time: 01/09/24  4:13 AM  Result Value Ref Range   Glucose-Capillary 126 (H) 70 - 99 mg/dL    Comment: Glucose reference range applies only to samples taken after fasting for at least 8 hours.   Comment 1 Notify RN    Comment 2 Document in Chart   Glucose, capillary     Status: Abnormal   Collection Time: 01/09/24  7:11 AM  Result Value Ref Range   Glucose-Capillary 126 (H) 70 - 99 mg/dL    Comment: Glucose reference range applies only to samples taken after fasting for at least 8 hours.   Comment 1 Notify RN    Comment 2 Document in Chart     Imaging / Studies: No results found.  Medications / Allergies: per chart  Antibiotics: Anti-infectives (From admission, onward)    Start     Dose/Rate Route Frequency Ordered Stop   01/04/24 1400  piperacillin-tazobactam (ZOSYN) IVPB 3.375 g       Note to Pharmacy: Pharmacy  to check dosing >> approved by [facility]   3.375 g 12.5 mL/hr over 240 Minutes Intravenous Every 8 hours 01/04/24 1257     12/25/23 1800  piperacillin-tazobactam (ZOSYN) IVPB 3.375 g  Status:  Discontinued       Note to Pharmacy: Pharmacy to check dosing >> approved by [facility]   3.375 g 12.5 mL/hr over 240 Minutes Intravenous Every 8 hours 12/25/23 1713 01/04/24 1257         Note: Portions of this report may have been transcribed using voice recognition software. Every effort was made to ensure accuracy; however, inadvertent computerized transcription errors may be present.   Any transcriptional errors that result from this process are unintentional.    Eddye Goodie, MD, FACS, MASCRS Esophageal, Gastrointestinal & Colorectal Surgery Robotic and Minimally Invasive Surgery  [facility] [address], [address] [place], [place] [address] [phone] Fax [phone] Main  CONTACT INFORMATION: Weekday (9AM-5PM): Call [name] main office at [phone] Weeknight (5PM-9AM) or Weekend/Holiday: Check EPIC "Web Links" tab & use "AMION" (password " [email or web address]") for General Surgery [name] coverage  Please, DO NOT use SecureChat  (it is not reliable communication to reach operating surgeons & will lead to a delay in care).   Epic staff messaging available for outptient concerns needing 1-2 business day response.      01/09/2024  7:40 AM

## 2024-01-09 NOTE — Plan of Care (Signed)

## 2024-01-09 NOTE — Progress Notes (Signed)
 Daily Progress Note   Patient Name: Steven [name]       Date: 01/09/2024 DOB: October 31, 1970  Age: 53 y.o. MRN#: [id number] Attending Physician: Everlean Hoar, MD Primary Care Physician: [name], MD Admit Date: 12/25/2023 Length of Stay: 15 days  Reason for Consultation/Follow-up: Pain control  Subjective:   CC: Patient laying in bed comfortably.  Following up regarding pain control.  Subjective:  Reviewed EMR prior to presenting to bedside.  At time of EMR review in past 24 hours patient has received as needed IV Dilaudid 2 mg x 5 doses, as needed oxycodone 10 mg x 2 doses, and as needed oxycodone 15 mg x 1 dose.  Patient continues to receive MS Contin 15 mg every 8 hours scheduled during the day.  Patient was also started on Klonopin 0.25 mg twice daily on 01/08/2019 5 in the evening. Reviewed surgery's notes regarding medical management today.  Presented to bedside to see patient.  Patient initially alone though friend Steven [name] presented to bedside and patient agreed to continue with conversation with friend present.  Able to follow-up on patient's symptom burden at this time.  Patient feels that from an anxiety point he is not any worse and is maintaining at this point.  Denied any symptoms of confusion or lethargy.  Discussed we will continue Klonopin at current dose though can increase if needed in the future. Also discussed based on patient's OME requirements, recommend increasing long-acting medication to 30 mg every 8 hours scheduled during the day.  Patient agreement this plan.  Discussed continuing as needed oxycodone and IV Dilaudid.  Spent time counseling patient on use of these medications.  All questions answered at that time.  Noted to have medicine team continue to follow patient medical journey.  Objective:   Vital Signs:  BP (!) 109/57   Pulse 86   Temp 98.6 F (37 C) (Oral)   Resp 12   Ht 5\' 10"  (1.778 m)   Wt 82 kg   SpO2 95%   BMI 25.94 kg/m   Physical  Exam: General: Awake, chronically ill-appearing, pleasant Cardiovascular: RRR Respiratory: no increased work of breathing noted, not in respiratory distress Neuro: A&Ox4 Psych: Calm  Imaging: I personally reviewed recent imaging.   Assessment & Plan:   Assessment: Patient is a 53 year old male with a past medical history of diabetes mellitus type 2, VTE on Xarelto, stage II rectal adenocarcinoma status post neoadjuvant concurrent chemoradiation in 2022 followed by a low anterior resection with a complicated postoperative course including need for ostomy with chronic pelvic abscess who was admitted on 12/25/2023 under general surgery service for management of perirectal wound bleeding. During hospitalization, patient has required multiple transfusions for acute blood loss anemia secondary to perineal wound. 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 Palliative medicine team consulted to assist with pain management.   Recommendations/Plan:  # Complex medical decision making/goals of care  - Patient continuing with appropriate medical interventions at this time.  Patient and wife inquired on for 01/08/2024 if patient could "survive this".  With permission did express concern about patient's low protein status and how this can affect wound healing.   Informed surgical team of patient and wife wanting to have continued discussions moving forward to determine pathways for medical care.  Palliative medicine team will continue to follow along and engage in conversations as able and appropriate.     Code Status:  Full Code  # Symptom management: Patient is receiving these palliative interventions for symptom management with an intent to improve quality of life.                 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   Within the past 24 hours hours, patient has required 10 mg of as needed Dilaudid and 35 mg of as needed oxycodone for opioid management. Based on OMEs calculated for this dose, which would be approximately 84 OME when reducing by 50% for incomplete cross tolerance, will appropriately start patient on the listed regimen below.                                - Increase MS Contin to 30 mg every 8 hours during the day.  Will continue to increase slowly as tolerated by patient.                               - Continue oxycodone 10-15 mg to every 3 hours as needed                               - Continue IV hydromorphone to 2 mg every 2 hours as needed                               - Continue Tylenol to 1000 mg tab every 8 hours                  - Anxiety                               - Continue IV Ativan 0.5 mg every 6 hours as needed    - Continue Klonopin 0.25 mg twice daily   # Psycho-social/Spiritual Support:  - Support System: Wife  # Discharge Planning: To Be Determined  Discussed with: Patient, patient's friend Steven Schiff, RN  Thank you for allowing the palliative care team to participate in the care [place].  Barnett Libel, DO Palliative Care Provider PMT # [phone]  If patient remains symptomatic despite maximum doses, please call PMT at [phone] between 0700 and 1900. Outside of these hours, please call attending, as PMT does not have night coverage.

## 2024-01-09 NOTE — Progress Notes (Signed)
 PHARMACY - TOTAL PARENTERAL NUTRITION CONSULT NOTE   Indication: EC Fistula   Patient Measurements: Height: 5\' 10"  (177.8 cm) Weight: 82 kg (180 lb 12.4 oz) IBW/kg (Calculated) : 73 TPN AdjBW (KG): 80.2 Body mass index is 25.94 kg/m. Usual Weight:   Assessment:  52 yoM w/ hx DM2, PE on Xarelto, stage II rectal cancer s/p chemoradiation. Underwent low anterior & abdominoperineal resections with ileostomy in 2022, redo LAR in 2024, ultimately end colostomy placement March 2025; admitted 3/28 for sacrococcygeal ulceration with necrosis & bleeding. Prior to admission, patient did not have issues tolerating solid foods and then as inpatient was able to take some solid food while on soft diet. Notable weight loss of 7% over 1 month. Pharmacy consulted to manage TPN for nutrition support on 4/2 (admission day 6) for EC fistula. While fistula output is not high, has chronic pelvic abscess with rectal stump disruption and ongoing leaking. 4/10 in OR abscess interrogated and drained into ostomy, underwent debridement of perineal wound.  4/8 recalculated TPN goal rate per RD recommendation, see below for macros  Glucose / Insulin: T2DM, A1c 7.5;  4/12: CBGs 78-132 with 50 units of insulin in TPN 4/11: CBGs 63-99 with 70 u insulin in TPN; patient requested mango ice/apple juice for CBG 63 4/10 unexpected response with CBGs 91-103 as patient was previously 107-178 with 80u in TPN, initiated D10W to run at 30 mL/h  4/9 Semglee 40 & SSI given outside of TPN, failed to include in TPN upon re-order for adjusted RD recommendations Electrolytes (4/11): Na decreased 135 to 133 (max in TPN), K 4.3 to 3.8, CoCa 9.2 Renal (4/11): Scr 0.57, BUN 23 Hepatic (4/10): LFTs WNL, AlkPhos 137, albumin <1.5 I/O: 3500 ml UOP, 450 mL out in drain  GI Imaging:  4/1 CT A/P: combination of oral contrast and air between bowel and sacrococcygeal wound suggesting combination of perforation/fistulization and necrotic  infection GI Surgeries / Procedures:  3/28 EUA for bleeding from perineal wound 4/2: debridement and wound vac placement 4/4  wound exploration, VAC dressing change 4/7 repeat dressing change 4/10 I&D of perineal wound, abdominal wall abscess  Central access: PICC ordered for TPN 4/2 TPN start date: 12/30/2023  Nutritional Goals: Initial goal rate 85 mL/hr (provides 108 g of protein and 2085 kcals per day) 4/8 Updated goal rate 90 mL/h provides 123 g protein and 2132 kcal   RD Assessment: Last updated 4/8 Estimated Needs Total Energy Estimated Needs: 2100-2350 kcals Total Protein Estimated Needs: 120-135 grams Total Fluid Estimated Needs: >/= 2.1L   Current Nutrition:  TPN + CLD (4/10) 4/6 resource breeze TID; Per RN, patient refused due to sugar content 4/9 Did not tolerate trial of Juven due to taste  Plan:  Continue TPN at goal rate of 90 mL/hr; Electrolytes in TPN: Na 150 mEq/L, K 45 mEq/L, Ca 5 mEq/L, Mg to 8 mEq/L, Phos 15 mmol/L, Cl:Ac ratio - 1:1 Add standard MVI and trace elements to TPN Chromium on hold d/t national shortage Continue 50 units insulin in TPN Continue sensitive SSI q4h, discontinue once stable CBGs 100 - 180  Monitor TPN labs on Mon/Thurs, PRN; next labs 4/14 AM F/U advancement of diet and ostomy output   Thank you for allowing pharmacy to be a part of this patient's care.  Alfredo Inch, PharmD, BCPS Clinical Pharmacist [place] 01/09/2024 7:09 AM

## 2024-01-10 DIAGNOSIS — Z7189 Other specified counseling: Secondary | ICD-10-CM

## 2024-01-10 LAB — GLUCOSE, CAPILLARY
Glucose-Capillary: 119 mg/dL — ABNORMAL HIGH (ref 70–99)
Glucose-Capillary: 135 mg/dL — ABNORMAL HIGH (ref 70–99)
Glucose-Capillary: 142 mg/dL — ABNORMAL HIGH (ref 70–99)
Glucose-Capillary: 148 mg/dL — ABNORMAL HIGH (ref 70–99)
Glucose-Capillary: 152 mg/dL — ABNORMAL HIGH (ref 70–99)
Glucose-Capillary: 167 mg/dL — ABNORMAL HIGH (ref 70–99)

## 2024-01-10 MED ORDER — GABAPENTIN 300 MG PO CAPS
300.0000 mg | ORAL_CAPSULE | ORAL | Status: AC
  Administered 2024-01-11: 300 mg via ORAL
  Filled 2024-01-10: qty 1

## 2024-01-10 MED ORDER — HYDROMORPHONE HCL 1 MG/ML IJ SOLN
1.0000 mg | INTRAMUSCULAR | Status: DC | PRN
  Administered 2024-01-10: 1 mg via INTRAVENOUS
  Administered 2024-01-10: 2 mg via INTRAVENOUS
  Administered 2024-01-11 (×3): 1 mg via INTRAVENOUS
  Administered 2024-01-11: 2 mg via INTRAVENOUS
  Administered 2024-01-12: 1 mg via INTRAVENOUS
  Administered 2024-01-12 (×3): 2 mg via INTRAVENOUS
  Administered 2024-01-12: 1 mg via INTRAVENOUS
  Administered 2024-01-12: 2 mg via INTRAVENOUS
  Administered 2024-01-13 (×3): 1 mg via INTRAVENOUS
  Administered 2024-01-13: 1.5 mg via INTRAVENOUS
  Administered 2024-01-13 (×2): 1 mg via INTRAVENOUS
  Filled 2024-01-10: qty 1
  Filled 2024-01-10: qty 2
  Filled 2024-01-10 (×2): qty 1
  Filled 2024-01-10: qty 2
  Filled 2024-01-10: qty 1
  Filled 2024-01-10 (×4): qty 2
  Filled 2024-01-10 (×7): qty 1
  Filled 2024-01-10: qty 2
  Filled 2024-01-10 (×2): qty 1
  Filled 2024-01-10: qty 2

## 2024-01-10 MED ORDER — ACETAMINOPHEN 500 MG PO TABS
1000.0000 mg | ORAL_TABLET | ORAL | Status: AC
  Administered 2024-01-11: 1000 mg via ORAL

## 2024-01-10 MED ORDER — DICLOFENAC SODIUM 1 % EX GEL: 2.0000 g | Freq: Four times a day (QID) | CUTANEOUS | Status: DC | PRN

## 2024-01-10 MED ORDER — TRAVASOL 10 % IV SOLN
INTRAVENOUS | Status: AC
  Filled 2024-01-10: qty 1231.2

## 2024-01-10 NOTE — Plan of Care (Signed)
  Problem: Education: Goal: Ability to describe self-care measures that may prevent or decrease complications (Diabetes Survival Skills Education) will improve Outcome: Progressing Goal: Individualized Educational Video(s) Outcome: Progressing   Problem: Coping: Goal: Ability to adjust to condition or change in health will improve Outcome: Progressing   Problem: Fluid Volume: Goal: Ability to maintain a balanced intake and output will improve Outcome: Progressing   Problem: Metabolic: Goal: Ability to maintain appropriate glucose levels will improve Outcome: Progressing   Problem: Nutritional: Goal: Maintenance of adequate nutrition will improve Outcome: Progressing Goal: Progress toward achieving an optimal weight will improve Outcome: Progressing   Problem: Skin Integrity: Goal: Risk for impaired skin integrity will decrease Outcome: Progressing   Problem: Tissue Perfusion: Goal: Adequacy of tissue perfusion will improve Outcome: Progressing   Problem: Health Behavior/Discharge Planning: Goal: Ability to manage health-related needs will improve Outcome: Progressing   Problem: Clinical Measurements: Goal: Ability to maintain clinical measurements within normal limits will improve Outcome: Progressing Goal: Will remain free from infection Outcome: Progressing Goal: Diagnostic test results will improve Outcome: Progressing Goal: Respiratory complications will improve Outcome: Progressing Goal: Cardiovascular complication will be avoided Outcome: Progressing   Problem: Activity: Goal: Risk for activity intolerance will decrease Outcome: Progressing   Problem: Elimination: Goal: Will not experience complications related to bowel motility Outcome: Progressing Goal: Will not experience complications related to urinary retention Outcome: Progressing   Problem: Pain Managment: Goal: General experience of comfort will improve and/or be controlled Outcome:  Progressing   Problem: Safety: Goal: Ability to remain free from injury will improve Outcome: Progressing   Problem: Skin Integrity: Goal: Risk for impaired skin integrity will decrease Outcome: Progressing

## 2024-01-10 NOTE — Progress Notes (Signed)
 01/10/2024  Steven [name] [id number] Nov 27, 1970  CARE TEAM: PCP: [name], MD  Outpatient Care Team: Patient Care Team: [name], MD as PCP - General (Family Medicine) Murleen Arms, MD as Consulting Physician (Hematology and Oncology) Joyce Nixon, MD as Consulting Physician (General Surgery) Causey, Laura Polio, NP as Nurse Practitioner (Hematology and Oncology) Johna Myers, MD as Consulting Physician (Radiation Oncology) Mansouraty, Albino Alu., MD as Consulting Physician (Gastroenterology) Roxane Copp, MD as Consulting Physician (Urology)  Inpatient Treatment Team: Treatment Team:  [place], Md, MD [name], Md, MD Joyce Nixon, MD Catheline Clos, RN Roxane Copp, MD Bernett Brill, [place] Digna Fraise, [place] Charmain Coons, RN Delpha Fickle, RPH Small, Ramonia Burns, RN Viann Graces, RN   Problem List:   Principal Problem:   Anemia due to acute blood loss Active Problems:   Post-operative hemoglobin drop   High risk medication use   Need for emotional support   Counseling and coordination of care   Medication management   Palliative care encounter   Disruption of perineal wound in male   Pain   Fistula   History of rectal cancer   01/07/2024  Rectal cancer with recurrences, chronic abscess, delayed fistulas & multiple wound issues: -rectal adenocarcinoma diagnosed 2022  -s/p chemoradiation with hx of LAR 2022 -redo LAR with colostomy 2024 -s/p completion APR 11/2023 with ongoing wound necrosis -S/p closure of bladder cystotomy/fistula 01/07/2024  Assessment [facility] Stay = 16 days) 3 Days Post-Op      Stabilizing but guarded    Assessment/Plan: HD#15 open infected perineal wound with fistulas  Delayed enterocutaneous fistula appears to have closed down with no more succus drainage.  Unfortunately now has urological fistula with Foley exposed status post Urology repair and [facility] 01/07/2024.  Vac change  twice a week qMon/Thu.  I believe plan is to take back tomorrow morning in the OR with removal of VAC tomorrow to ensure there are no major issues.  Possibly Dr. Andy Bannister versus Dr. Marny Sires he is coming on the inpatient surgery service if that goes well then can see if we can do a bedside VAC changes in the near future.  Defer ultimately to Dr. Andy Bannister who is on point from a colorectal and surgical standpoint.  Continue foley. UOP 1600 ml/24. Per urology cont foley as long as it is draining well. If stops draining or fistula reopens, then most likely will need more aggressive urinary diversion with bilateral PERC nephrostomy tubes.   Very edematous most likely due to severe malnutrition.  Continue TPN.  Diuresis as tolerated  Monitor abdominal wall abscess near stoma - decreased  Hgb stable at 7.8 (8.8 preop).  Will give IV iron since he is severely malnourished with unreliable digestive tract absorption.  Try and add some oral iron that may help constipate as well.  Trying lab holiday for now and reevaluate in morning  Monitor blood pressure  Continue IV Zosyn (3/28>>).  Defer to Dr. Andy Bannister on length.  Do not know if infectious disease needs to be involved or not.  May be able to start backing off if wound is cleaning up to avoid development of multidrug-resistant  Full nutrition.  TPN for now.  If does well with wound VAC change in OR, can consider advancing diet with calorie counts and weaning TPN.  If that does not work, may need more prolonged bowel rest with IV TPN.  Nystatin for rash lower abdomen/groin.  Urinary diversion with Foley.  Fecal diversion with colostomy  Palliative consulted to assist with pain and anxiety and adjustments made - appreciate their help   FEN: TPN.  Dysphagia 1/full liquid diet/12-.  Perhaps can advance to solid diet in the coming week if does okay  ID: zosyn 3/28>  VTE: SCDs - chemical ppx and xarelto held in setting of anemia   DM with hyperglycemia -  on insulin  ABL anemia with anemia of chronic disease- as above.  Try to supplement IV and p.o. and follow.  If stabilized and improved, can consider reanticoagulating if no more surgery planned.  History of VTE on xarelto at baseline. Hold anticoag    I updated the patient's status to the patient and nurse  Recommendations were made.  Questions were answered.  They expressed understanding & appreciation.  -Disposition: To be determined.  I believe Dr. Andy Bannister to return on Monday and can help resume lead in management of this extremely complex patient       I reviewed nursing notes, last 24 h vitals and pain scores, last 48 h intake and output, last 24 h labs and trends, and last 24 h imaging results.  I have reviewed this patient's available data, including medical history, events of note, test results, etc as part of my evaluation.   A significant portion of that time was spent in counseling. Care during the described time interval was provided by me.  This care required high  level of medical decision making.  01/10/2024    Subjective: (Chief complaint)  No major events.  Tolerated pudding and full liquid diet without any issues.  Colostomy functioning.  Pain controlled.  No issues with wound VAC on perineum.  Objective:  Vital signs:  Vitals:   01/10/24 0400 01/10/24 0600 01/10/24 0700 01/10/24 0708  BP: (!) 115/52 (!) 110/54    Pulse: 97 93 92   Resp: 12 14 11    Temp: 98.7 F (37.1 C)   98.4 F (36.9 C)  TempSrc: Oral   Oral  SpO2: 95% 95% 97%   Weight:      Height:        Last BM Date : 01/08/24  Intake/Output   Yesterday:  04/12 0701 - 04/13 0700 In: 3729.3 [I.V.:3474.3; IV Piggyback:255] Out: 7829 [Urine:5375; Drains:200] This shift:  Total I/O In: 163.8 [I.V.:144; IV Piggyback:19.8] Out: -   Bowel function:  Flatus: YES  BM:  YES  Drain: Serosanguinous   Physical Exam:  General: Pt awake/alert in no acute distress.  Looking at  phone.  Calm.   Eyes: PERRL, normal EOM.  Sclera clear.  No icterus Neuro: CN II-XII intact w/o focal sensory/motor deficits. Lymph: No head/neck/groin lymphadenopathy Psych:  No delerium/psychosis/paranoia.  Oriented x 4 HENT: Normocephalic, Mucus membranes moist.  No thrush Neck: Supple, No tracheal deviation.  No obvious thyromegaly Chest: No pain to chest wall compression.  Good respiratory excursion.  No audible wheezing CV:  Pulses intact.  Regular rhythm.  No major extremity edema MS: Normal AROM mjr joints.  No obvious deformity  Abdomen: Soft.  Nondistended.  Nontender.  Colostomy with gas and stool in bag.  Minimal cellulitis/erythema.  No evidence of peritonitis.  No incarcerated hernias.  Catheter in place.  Perineal wound VAC without any frank feculent/succus drainage  Ext:   No deformity.  3+ edema.  No cyanosis Skin: No petechiae / purpurea.  No major sores.  Warm and dry    Results:   Cultures: Recent Results (from the past 720 hours)  MRSA Next Gen by  PCR, Nasal     Status: None   Collection Time: 12/16/23  9:31 AM   Specimen: Nasal Mucosa; Nasal Swab  Result Value Ref Range Status   MRSA by PCR Next Gen NOT DETECTED NOT DETECTED Final    Comment: (NOTE) The GeneXpert MRSA Assay (FDA approved for NASAL specimens only), is one component of a comprehensive MRSA colonization surveillance program. It is not intended to diagnose MRSA infection nor to guide or monitor treatment for MRSA infections. Test performance is not FDA approved in patients less than 20 years old. Performed at [facility], [address] [place], [place] [address]   MRSA Next Gen by PCR, Nasal     Status: None   Collection Time: 12/25/23  4:38 PM   Specimen: Nasal Mucosa; Nasal Swab  Result Value Ref Range Status   MRSA by PCR Next Gen NOT DETECTED NOT DETECTED Final    Comment: (NOTE) The GeneXpert MRSA Assay (FDA approved for NASAL specimens only), is one component  of a comprehensive MRSA colonization surveillance program. It is not intended to diagnose MRSA infection nor to guide or monitor treatment for MRSA infections. Test performance is not FDA approved in patients less than 60 years old. Performed at [facility], [address], [place], [place] [address]     Labs: Results for orders placed or performed during the hospital encounter of 12/25/23 (from the past 48 hours)  Glucose, capillary     Status: Abnormal   Collection Time: 01/08/24  7:43 AM  Result Value Ref Range   Glucose-Capillary 103 (H) 70 - 99 mg/dL    Comment: Glucose reference range applies only to samples taken after fasting for at least 8 hours.  Glucose, capillary     Status: Abnormal   Collection Time: 01/08/24 11:23 AM  Result Value Ref Range   Glucose-Capillary 132 (H) 70 - 99 mg/dL    Comment: Glucose reference range applies only to samples taken after fasting for at least 8 hours.  Glucose, capillary     Status: Abnormal   Collection Time: 01/08/24  5:01 PM  Result Value Ref Range   Glucose-Capillary 120 (H) 70 - 99 mg/dL    Comment: Glucose reference range applies only to samples taken after fasting for at least 8 hours.  Glucose, capillary     Status: Abnormal   Collection Time: 01/08/24  7:31 PM  Result Value Ref Range   Glucose-Capillary 131 (H) 70 - 99 mg/dL    Comment: Glucose reference range applies only to samples taken after fasting for at least 8 hours.   Comment 1 Notify RN    Comment 2 Document in Chart   Glucose, capillary     Status: Abnormal   Collection Time: 01/09/24 12:43 AM  Result Value Ref Range   Glucose-Capillary 129 (H) 70 - 99 mg/dL    Comment: Glucose reference range applies only to samples taken after fasting for at least 8 hours.  Glucose, capillary     Status: Abnormal   Collection Time: 01/09/24  4:13 AM  Result Value Ref Range   Glucose-Capillary 126 (H) 70 - 99 mg/dL    Comment: Glucose reference range  applies only to samples taken after fasting for at least 8 hours.   Comment 1 Notify RN    Comment 2 Document in Chart   Glucose, capillary     Status: Abnormal   Collection Time: 01/09/24  7:11 AM  Result Value Ref Range  Glucose-Capillary 126 (H) 70 - 99 mg/dL    Comment: Glucose reference range applies only to samples taken after fasting for at least 8 hours.   Comment 1 Notify RN    Comment 2 Document in Chart   Glucose, capillary     Status: Abnormal   Collection Time: 01/09/24 11:16 AM  Result Value Ref Range   Glucose-Capillary 134 (H) 70 - 99 mg/dL    Comment: Glucose reference range applies only to samples taken after fasting for at least 8 hours.   Comment 1 Notify RN    Comment 2 Document in Chart   Glucose, capillary     Status: Abnormal   Collection Time: 01/09/24  3:25 PM  Result Value Ref Range   Glucose-Capillary 156 (H) 70 - 99 mg/dL    Comment: Glucose reference range applies only to samples taken after fasting for at least 8 hours.   Comment 1 Notify RN    Comment 2 Document in Chart   Glucose, capillary     Status: Abnormal   Collection Time: 01/09/24  8:20 PM  Result Value Ref Range   Glucose-Capillary 227 (H) 70 - 99 mg/dL    Comment: Glucose reference range applies only to samples taken after fasting for at least 8 hours.   Comment 1 Notify RN    Comment 2 Document in Chart   Glucose, capillary     Status: Abnormal   Collection Time: 01/10/24 12:34 AM  Result Value Ref Range   Glucose-Capillary 167 (H) 70 - 99 mg/dL    Comment: Glucose reference range applies only to samples taken after fasting for at least 8 hours.  Glucose, capillary     Status: Abnormal   Collection Time: 01/10/24  5:20 AM  Result Value Ref Range   Glucose-Capillary 142 (H) 70 - 99 mg/dL    Comment: Glucose reference range applies only to samples taken after fasting for at least 8 hours.    Imaging / Studies: No results found.  Medications / Allergies: per  chart  Antibiotics: Anti-infectives (From admission, onward)    Start     Dose/Rate Route Frequency Ordered Stop   01/04/24 1400  piperacillin-tazobactam (ZOSYN) IVPB 3.375 g       Note to Pharmacy: Pharmacy to check dosing >> approved by [facility]   3.375 g 12.5 mL/hr over 240 Minutes Intravenous Every 8 hours 01/04/24 1257     12/25/23 1800  piperacillin-tazobactam (ZOSYN) IVPB 3.375 g  Status:  Discontinued       Note to Pharmacy: Pharmacy to check dosing >> approved by [facility]   3.375 g 12.5 mL/hr over 240 Minutes Intravenous Every 8 hours 12/25/23 1713 01/04/24 1257         Note: Portions of this report may have been transcribed using voice recognition software. Every effort was made to ensure accuracy; however, inadvertent computerized transcription errors may be present.   Any transcriptional errors that result from this process are unintentional.    Eddye Goodie, MD, FACS, MASCRS Esophageal, Gastrointestinal & Colorectal Surgery Robotic and Minimally Invasive Surgery  [facility] [address] [place], [place] [address] [phone] Fax [phone] Main  CONTACT INFORMATION: Weekday (9AM-5PM): Call [name] main office at [phone] Weeknight (5PM-9AM) or Weekend/Holiday: Check EPIC "Web Links" tab & use "AMION" (password " [email or web address]") for General Surgery [name] coverage  Please, DO NOT use SecureChat  (it is not reliable communication to reach operating surgeons &  will lead to a delay in care).   Epic staff messaging available for outptient concerns needing 1-2 business day response.      01/10/2024  7:24 AM

## 2024-01-10 NOTE — Progress Notes (Signed)
 Daily Progress Note   Patient Name: Steven [name]       Date: 01/10/2024 DOB: October 23, 1970  Age: 53 y.o. MRN#: [id number] Attending Physician: Everlean Hoar, MD Primary Care Physician: [name], MD Admit Date: 12/25/2023 Length of Stay: 16 days  Reason for Consultation/Follow-up: Pain control  Subjective:   CC: Patient laying in bed eating.  Following up regarding pain control.  Subjective:  Reviewed EMR prior to presenting to bedside.  At time of EMR review in past 24 hours patient has received as needed IV Dilaudid 2 mg x 6 doses and as needed oxycodone 50 mg x 3 doses.  Patient continues to receive MS Contin 30 mg every 8 hours during the day which was increased on 01/09/2024.  Patient continuing to receive clonazepam 0.25 mg twice daily.  Patient has not required any as needed IV Ativan. Reviewed surgical team's note for today.  Planning for patient to return to the OR with removal of VAC tomorrow.  Patient currently allowed dysphagia diet; hoping to advance in upcoming week if no complications noted in ER.  Presented to bedside to see patient.  Patient lying comfortably in bed eating.  Patient's brother present at bedside.  Patient gave permission to continue with conversation with family present.  Able to introduce myself as a member of the palliative medicine team to patient's brother.  Discussed patient's symptom management at this time.  Patient feels that from a pain and anxiety aspect, medications are currently working well for management.  Discussed continuing with current medication doses.  Patient and brother did inquire further about discussions regarding ACP documentation.  Spent time explaining what Leonard  advance care planning documentation would contained including healthcare power of attorney documentation, wishes regarding organ donation, and wishes regarding medical care.  Discussed 1 can specify in documentation specific desires regarding medical care in this  documentation.  Discussed how this can be notarized and placed on patient's chart for reference.  Patient and brother acknowledging this and will further discuss.  At this time patient continuing to hope for improvement.  Patient continuing with full scope of medical interventions.  Acknowledged this and spent time providing emotional support via active listening.  All questions answered at that time.  Noted to have medicine team continue to follow patient medical journey.  Objective:   Vital Signs:  BP (!) 99/49 (BP Location: Left Arm)   Pulse 88   Temp 98.4 F (36.9 C) (Oral)   Resp 18   Ht 5\' 10"  (1.778 m)   Wt 82 kg   SpO2 95%   BMI 25.94 kg/m   Physical Exam: General: Awake, chronically ill-appearing, pleasant Cardiovascular: RRR Respiratory: no increased work of breathing noted, not in respiratory distress Neuro: A&Ox4 Psych: Calm  Imaging: I personally reviewed recent imaging.   Assessment & Plan:   Assessment: Patient is a 53 year old male with a past medical history of diabetes mellitus type 2, VTE on Xarelto, stage II rectal adenocarcinoma status post neoadjuvant concurrent chemoradiation in 2022 followed by a low anterior resection with a complicated postoperative course including need for ostomy with chronic pelvic abscess who was admitted on 12/25/2023 under general surgery service for management of perirectal wound bleeding. During hospitalization, patient has required multiple transfusions for acute blood loss anemia secondary to perineal wound. 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 Palliative medicine team consulted to assist with pain management.  Recommendations/Plan:  # Complex medical decision making/goals of care  - Discussed care with patient and brother as detailed above in HPI.  Patient and brother inquired further about what ACP  documentation would contain so discussed this with them to provide updates.  Patient continuing to hope for improvement and so continuing with full scope of medical interventions at this time.  Palliative medicine team will continue to follow along and engage in conversations as able and appropriate.     Code Status: Full Code  # Symptom management: Patient is receiving these palliative interventions for symptom management with an intent to improve quality of life.                 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                                 - Continue MS Contin to 30 mg every 8 hours during the day.  Will continue to increase slowly as tolerated by patient.                               - Continue oxycodone 10-15 mg to every 3 hours as needed                               - Continue IV hydromorphone to 2 mg every 2 hours as needed                               - Continue Tylenol to 1000 mg tab every 8 hours                  - Anxiety                               - Continue IV Ativan 0.5 mg every 6 hours as needed    - Continue Klonopin 0.25 mg twice daily   # Psycho-social/Spiritual Support:  - Support System: Wife  # Discharge Planning: To Be Determined  Discussed with: Patient, patient's brother, RN  Thank you for allowing the palliative care team to participate in the care [place] Le.  Barnett Libel, DO Palliative Care Provider PMT # [phone]  If patient remains symptomatic despite maximum doses, please call PMT at [phone] between 0700 and 1900. Outside of these hours, please call attending, as PMT does not have night coverage.   Personally spent 35 minutes in patient care including extensive chart review (labs, imaging, progress/consult notes, vital signs), medically appropraite exam, discussed with treatment team, education to patient, family, and  staff, documenting clinical information, medication review and management, coordination of care, and available advanced directive documents.

## 2024-01-10 NOTE — Progress Notes (Signed)
 PHARMACY - TOTAL PARENTERAL NUTRITION CONSULT NOTE   Indication: EC Fistula   Patient Measurements: Height: 5\' 10"  (177.8 cm) Weight: 82 kg (180 lb 12.4 oz) IBW/kg (Calculated) : 73 TPN AdjBW (KG): 80.2 Body mass index is 25.94 kg/m. Usual Weight:   Assessment:  52 yoM w/ hx DM2, PE on Xarelto, stage II rectal cancer s/p chemoradiation. Underwent low anterior & abdominoperineal resections with ileostomy in 2022, redo LAR in 2024, ultimately end colostomy placement March 2025; admitted 3/28 for sacrococcygeal ulceration with necrosis & bleeding. Prior to admission, patient did not have issues tolerating solid foods and then as inpatient was able to take some solid food while on soft diet. Notable weight loss of 7% over 1 month. Pharmacy consulted to manage TPN for nutrition support on 4/2 (admission day 6) for EC fistula. While fistula output is not high, has chronic pelvic abscess with rectal stump disruption and ongoing leaking. 4/10 in OR abscess interrogated and drained into ostomy, underwent debridement of perineal wound.  4/8 recalculated TPN goal rate per RD recommendation, see below for macros  Glucose / Insulin: T2DM, A1c 7.5;  4/13 CBGs 126-227 with 50 units of insulin in TPN & advancing diet 4/12: CBGs 78-132 with 50 units of insulin in TPN 4/11: CBGs 63-99 with 70 u insulin in TPN; patient requested mango ice/apple juice for CBG 63 4/10 unexpected response with CBGs 91-103 as patient was previously 107-178 with 80u in TPN, initiated D10W to run at 30 mL/h  4/9 Semglee 40 & SSI given outside of TPN, failed to include in TPN upon re-order for adjusted RD recommendations Electrolytes (4/11): Na decreased 135 to 133 (max in TPN), K 4.3 to 3.8, CoCa 9.2 Renal (4/11): Scr 0.57, BUN 23 Hepatic (4/10): LFTs WNL, AlkPhos 137, albumin <1.5 I/O: 3500 ml UOP, 450 mL out in drain  GI Imaging:  4/1 CT A/P: combination of oral contrast and air between bowel and sacrococcygeal wound  suggesting combination of perforation/fistulization and necrotic infection GI Surgeries / Procedures:  3/28 EUA for bleeding from perineal wound 4/2: debridement and wound vac placement 4/4  wound exploration, VAC dressing change 4/7 repeat dressing change 4/10 I&D of perineal wound, abdominal wall abscess  Central access: PICC ordered for TPN 4/2 TPN start date: 12/30/2023  Nutritional Goals: Initial goal rate 85 mL/hr (provides 108 g of protein and 2085 kcals per day) 4/8 Updated goal rate 90 mL/h provides 123 g protein and 2132 kcal   RD Assessment: Last updated 4/8 Estimated Needs Total Energy Estimated Needs: 2100-2350 kcals Total Protein Estimated Needs: 120-135 grams Total Fluid Estimated Needs: >/= 2.1L   Current Nutrition:  TPN + DYS 1 (4/12) 4/6 resource breeze TID; Per RN, patient refused due to sugar content 4/9 Did not tolerate trial of Juven due to taste 4/12 advanced to dysphagia 1/full liquid diet  Plan:  Continue TPN at goal rate of 90 mL/hr; Electrolytes in TPN: Na 150 mEq/L, K 45 mEq/L, Ca 5 mEq/L, Mg to 8 mEq/L, Phos 15 mmol/L, Cl:Ac ratio - 1:1 Add standard MVI and trace elements to TPN Chromium on hold d/t national shortage Continue 50 units insulin in TPN Continue sensitive SSI q4h (advancing diet) Monitor TPN labs on Mon/Thurs, PRN; next labs 4/14 AM F/U advancement of diet and ostomy output   Thank you for allowing pharmacy to be a part of this patient's care.  Alfredo Inch, PharmD, BCPS Clinical Pharmacist [facility] 01/10/2024 9:14 AM

## 2024-01-10 NOTE — H&P (View-Only) (Signed)
 01/10/2024  Steven [name] [id number] Nov 27, 1970  CARE TEAM: PCP: [name], MD  Outpatient Care Team: Patient Care Team: [name], MD as PCP - General (Family Medicine) Murleen Arms, MD as Consulting Physician (Hematology and Oncology) Joyce Nixon, MD as Consulting Physician (General Surgery) Causey, Laura Polio, NP as Nurse Practitioner (Hematology and Oncology) Johna Myers, MD as Consulting Physician (Radiation Oncology) Mansouraty, Albino Alu., MD as Consulting Physician (Gastroenterology) Roxane Copp, MD as Consulting Physician (Urology)  Inpatient Treatment Team: Treatment Team:  [place], Md, MD [name], Md, MD Joyce Nixon, MD Catheline Clos, RN Roxane Copp, MD Bernett Brill, [place] Digna Fraise, [place] Charmain Coons, RN Delpha Fickle, RPH Small, Ramonia Burns, RN Viann Graces, RN   Problem List:   Principal Problem:   Anemia due to acute blood loss Active Problems:   Post-operative hemoglobin drop   High risk medication use   Need for emotional support   Counseling and coordination of care   Medication management   Palliative care encounter   Disruption of perineal wound in male   Pain   Fistula   History of rectal cancer   01/07/2024  Rectal cancer with recurrences, chronic abscess, delayed fistulas & multiple wound issues: -rectal adenocarcinoma diagnosed 2022  -s/p chemoradiation with hx of LAR 2022 -redo LAR with colostomy 2024 -s/p completion APR 11/2023 with ongoing wound necrosis -S/p closure of bladder cystotomy/fistula 01/07/2024  Assessment [facility] Stay = 16 days) 3 Days Post-Op      Stabilizing but guarded    Assessment/Plan: HD#15 open infected perineal wound with fistulas  Delayed enterocutaneous fistula appears to have closed down with no more succus drainage.  Unfortunately now has urological fistula with Foley exposed status post Urology repair and [facility] 01/07/2024.  Vac change  twice a week qMon/Thu.  I believe plan is to take back tomorrow morning in the OR with removal of VAC tomorrow to ensure there are no major issues.  Possibly Dr. Andy Bannister versus Dr. Marny Sires he is coming on the inpatient surgery service if that goes well then can see if we can do a bedside VAC changes in the near future.  Defer ultimately to Dr. Andy Bannister who is on point from a colorectal and surgical standpoint.  Continue foley. UOP 1600 ml/24. Per urology cont foley as long as it is draining well. If stops draining or fistula reopens, then most likely will need more aggressive urinary diversion with bilateral PERC nephrostomy tubes.   Very edematous most likely due to severe malnutrition.  Continue TPN.  Diuresis as tolerated  Monitor abdominal wall abscess near stoma - decreased  Hgb stable at 7.8 (8.8 preop).  Will give IV iron since he is severely malnourished with unreliable digestive tract absorption.  Try and add some oral iron that may help constipate as well.  Trying lab holiday for now and reevaluate in morning  Monitor blood pressure  Continue IV Zosyn (3/28>>).  Defer to Dr. Andy Bannister on length.  Do not know if infectious disease needs to be involved or not.  May be able to start backing off if wound is cleaning up to avoid development of multidrug-resistant  Full nutrition.  TPN for now.  If does well with wound VAC change in OR, can consider advancing diet with calorie counts and weaning TPN.  If that does not work, may need more prolonged bowel rest with IV TPN.  Nystatin for rash lower abdomen/groin.  Urinary diversion with Foley.  Fecal diversion with colostomy  Palliative consulted to assist with pain and anxiety and adjustments made - appreciate their help   FEN: TPN.  Dysphagia 1/full liquid diet/12-.  Perhaps can advance to solid diet in the coming week if does okay  ID: zosyn 3/28>  VTE: SCDs - chemical ppx and xarelto held in setting of anemia   DM with hyperglycemia -  on insulin  ABL anemia with anemia of chronic disease- as above.  Try to supplement IV and p.o. and follow.  If stabilized and improved, can consider reanticoagulating if no more surgery planned.  History of VTE on xarelto at baseline. Hold anticoag    I updated the patient's status to the patient and nurse  Recommendations were made.  Questions were answered.  They expressed understanding & appreciation.  -Disposition: To be determined.  I believe Dr. Andy Bannister to return on Monday and can help resume lead in management of this extremely complex patient       I reviewed nursing notes, last 24 h vitals and pain scores, last 48 h intake and output, last 24 h labs and trends, and last 24 h imaging results.  I have reviewed this patient's available data, including medical history, events of note, test results, etc as part of my evaluation.   A significant portion of that time was spent in counseling. Care during the described time interval was provided by me.  This care required high  level of medical decision making.  01/10/2024    Subjective: (Chief complaint)  No major events.  Tolerated pudding and full liquid diet without any issues.  Colostomy functioning.  Pain controlled.  No issues with wound VAC on perineum.  Objective:  Vital signs:  Vitals:   01/10/24 0400 01/10/24 0600 01/10/24 0700 01/10/24 0708  BP: (!) 115/52 (!) 110/54    Pulse: 97 93 92   Resp: 12 14 11    Temp: 98.7 F (37.1 C)   98.4 F (36.9 C)  TempSrc: Oral   Oral  SpO2: 95% 95% 97%   Weight:      Height:        Last BM Date : 01/08/24  Intake/Output   Yesterday:  04/12 0701 - 04/13 0700 In: 3729.3 [I.V.:3474.3; IV Piggyback:255] Out: 7829 [Urine:5375; Drains:200] This shift:  Total I/O In: 163.8 [I.V.:144; IV Piggyback:19.8] Out: -   Bowel function:  Flatus: YES  BM:  YES  Drain: Serosanguinous   Physical Exam:  General: Pt awake/alert in no acute distress.  Looking at  phone.  Calm.   Eyes: PERRL, normal EOM.  Sclera clear.  No icterus Neuro: CN II-XII intact w/o focal sensory/motor deficits. Lymph: No head/neck/groin lymphadenopathy Psych:  No delerium/psychosis/paranoia.  Oriented x 4 HENT: Normocephalic, Mucus membranes moist.  No thrush Neck: Supple, No tracheal deviation.  No obvious thyromegaly Chest: No pain to chest wall compression.  Good respiratory excursion.  No audible wheezing CV:  Pulses intact.  Regular rhythm.  No major extremity edema MS: Normal AROM mjr joints.  No obvious deformity  Abdomen: Soft.  Nondistended.  Nontender.  Colostomy with gas and stool in bag.  Minimal cellulitis/erythema.  No evidence of peritonitis.  No incarcerated hernias.  Catheter in place.  Perineal wound VAC without any frank feculent/succus drainage  Ext:   No deformity.  3+ edema.  No cyanosis Skin: No petechiae / purpurea.  No major sores.  Warm and dry    Results:   Cultures: Recent Results (from the past 720 hours)  MRSA Next Gen by  PCR, Nasal     Status: None   Collection Time: 12/16/23  9:31 AM   Specimen: Nasal Mucosa; Nasal Swab  Result Value Ref Range Status   MRSA by PCR Next Gen NOT DETECTED NOT DETECTED Final    Comment: (NOTE) The GeneXpert MRSA Assay (FDA approved for NASAL specimens only), is one component of a comprehensive MRSA colonization surveillance program. It is not intended to diagnose MRSA infection nor to guide or monitor treatment for MRSA infections. Test performance is not FDA approved in patients less than 20 years old. Performed at [facility], [address] [place], [place] [address]   MRSA Next Gen by PCR, Nasal     Status: None   Collection Time: 12/25/23  4:38 PM   Specimen: Nasal Mucosa; Nasal Swab  Result Value Ref Range Status   MRSA by PCR Next Gen NOT DETECTED NOT DETECTED Final    Comment: (NOTE) The GeneXpert MRSA Assay (FDA approved for NASAL specimens only), is one component  of a comprehensive MRSA colonization surveillance program. It is not intended to diagnose MRSA infection nor to guide or monitor treatment for MRSA infections. Test performance is not FDA approved in patients less than 60 years old. Performed at [facility], [address], [place], [place] [address]     Labs: Results for orders placed or performed during the hospital encounter of 12/25/23 (from the past 48 hours)  Glucose, capillary     Status: Abnormal   Collection Time: 01/08/24  7:43 AM  Result Value Ref Range   Glucose-Capillary 103 (H) 70 - 99 mg/dL    Comment: Glucose reference range applies only to samples taken after fasting for at least 8 hours.  Glucose, capillary     Status: Abnormal   Collection Time: 01/08/24 11:23 AM  Result Value Ref Range   Glucose-Capillary 132 (H) 70 - 99 mg/dL    Comment: Glucose reference range applies only to samples taken after fasting for at least 8 hours.  Glucose, capillary     Status: Abnormal   Collection Time: 01/08/24  5:01 PM  Result Value Ref Range   Glucose-Capillary 120 (H) 70 - 99 mg/dL    Comment: Glucose reference range applies only to samples taken after fasting for at least 8 hours.  Glucose, capillary     Status: Abnormal   Collection Time: 01/08/24  7:31 PM  Result Value Ref Range   Glucose-Capillary 131 (H) 70 - 99 mg/dL    Comment: Glucose reference range applies only to samples taken after fasting for at least 8 hours.   Comment 1 Notify RN    Comment 2 Document in Chart   Glucose, capillary     Status: Abnormal   Collection Time: 01/09/24 12:43 AM  Result Value Ref Range   Glucose-Capillary 129 (H) 70 - 99 mg/dL    Comment: Glucose reference range applies only to samples taken after fasting for at least 8 hours.  Glucose, capillary     Status: Abnormal   Collection Time: 01/09/24  4:13 AM  Result Value Ref Range   Glucose-Capillary 126 (H) 70 - 99 mg/dL    Comment: Glucose reference range  applies only to samples taken after fasting for at least 8 hours.   Comment 1 Notify RN    Comment 2 Document in Chart   Glucose, capillary     Status: Abnormal   Collection Time: 01/09/24  7:11 AM  Result Value Ref Range  Glucose-Capillary 126 (H) 70 - 99 mg/dL    Comment: Glucose reference range applies only to samples taken after fasting for at least 8 hours.   Comment 1 Notify RN    Comment 2 Document in Chart   Glucose, capillary     Status: Abnormal   Collection Time: 01/09/24 11:16 AM  Result Value Ref Range   Glucose-Capillary 134 (H) 70 - 99 mg/dL    Comment: Glucose reference range applies only to samples taken after fasting for at least 8 hours.   Comment 1 Notify RN    Comment 2 Document in Chart   Glucose, capillary     Status: Abnormal   Collection Time: 01/09/24  3:25 PM  Result Value Ref Range   Glucose-Capillary 156 (H) 70 - 99 mg/dL    Comment: Glucose reference range applies only to samples taken after fasting for at least 8 hours.   Comment 1 Notify RN    Comment 2 Document in Chart   Glucose, capillary     Status: Abnormal   Collection Time: 01/09/24  8:20 PM  Result Value Ref Range   Glucose-Capillary 227 (H) 70 - 99 mg/dL    Comment: Glucose reference range applies only to samples taken after fasting for at least 8 hours.   Comment 1 Notify RN    Comment 2 Document in Chart   Glucose, capillary     Status: Abnormal   Collection Time: 01/10/24 12:34 AM  Result Value Ref Range   Glucose-Capillary 167 (H) 70 - 99 mg/dL    Comment: Glucose reference range applies only to samples taken after fasting for at least 8 hours.  Glucose, capillary     Status: Abnormal   Collection Time: 01/10/24  5:20 AM  Result Value Ref Range   Glucose-Capillary 142 (H) 70 - 99 mg/dL    Comment: Glucose reference range applies only to samples taken after fasting for at least 8 hours.    Imaging / Studies: No results found.  Medications / Allergies: per  chart  Antibiotics: Anti-infectives (From admission, onward)    Start     Dose/Rate Route Frequency Ordered Stop   01/04/24 1400  piperacillin-tazobactam (ZOSYN) IVPB 3.375 g       Note to Pharmacy: Pharmacy to check dosing >> approved by [facility]   3.375 g 12.5 mL/hr over 240 Minutes Intravenous Every 8 hours 01/04/24 1257     12/25/23 1800  piperacillin-tazobactam (ZOSYN) IVPB 3.375 g  Status:  Discontinued       Note to Pharmacy: Pharmacy to check dosing >> approved by [facility]   3.375 g 12.5 mL/hr over 240 Minutes Intravenous Every 8 hours 12/25/23 1713 01/04/24 1257         Note: Portions of this report may have been transcribed using voice recognition software. Every effort was made to ensure accuracy; however, inadvertent computerized transcription errors may be present.   Any transcriptional errors that result from this process are unintentional.    Eddye Goodie, MD, FACS, MASCRS Esophageal, Gastrointestinal & Colorectal Surgery Robotic and Minimally Invasive Surgery  [facility] [address] [place], [place] [address] [phone] Fax [phone] Main  CONTACT INFORMATION: Weekday (9AM-5PM): Call [name] main office at [phone] Weeknight (5PM-9AM) or Weekend/Holiday: Check EPIC "Web Links" tab & use "AMION" (password " [email or web address]") for General Surgery [name] coverage  Please, DO NOT use SecureChat  (it is not reliable communication to reach operating surgeons &  will lead to a delay in care).   Epic staff messaging available for outptient concerns needing 1-2 business day response.      01/10/2024  7:24 AM

## 2024-01-11 ENCOUNTER — Encounter (HOSPITAL_COMMUNITY): Admission: EM | Disposition: A | Discharge status: Skilled Nursing Facility | Payer: Self-pay | Source: Home / Self Care

## 2024-01-11 ENCOUNTER — Inpatient Hospital Stay (HOSPITAL_COMMUNITY): Admitting: Anesthesiology

## 2024-01-11 ENCOUNTER — Encounter (HOSPITAL_COMMUNITY): Payer: Self-pay

## 2024-01-11 HISTORY — PX: IRRIGATION AND DEBRIDEMENT ABSCESS: SHX5252

## 2024-01-11 LAB — COMPREHENSIVE METABOLIC PANEL WITH GFR
ALT: 7 U/L (ref 0–44)
AST: 12 U/L — ABNORMAL LOW (ref 15–41)
Albumin: 1.5 g/dL — ABNORMAL LOW (ref 3.5–5.0)
Alkaline Phosphatase: 154 U/L — ABNORMAL HIGH (ref 38–126)
Anion gap: 6 (ref 5–15)
BUN: 22 mg/dL — ABNORMAL HIGH (ref 6–20)
CO2: 25 mmol/L (ref 22–32)
Calcium: 7.8 mg/dL — ABNORMAL LOW (ref 8.9–10.3)
Chloride: 102 mmol/L (ref 98–111)
Creatinine, Ser: 0.51 mg/dL — ABNORMAL LOW (ref 0.61–1.24)
GFR, Estimated: >60 mL/min (ref >60)
Glucose, Bld: 134 mg/dL — ABNORMAL HIGH (ref 70–99)
Potassium: 4.2 mmol/L (ref 3.5–5.1)
Sodium: 133 mmol/L — ABNORMAL LOW (ref 135–145)
Total Bilirubin: 0.6 mg/dL (ref 0.0–1.2)
Total Protein: 5.6 g/dL — ABNORMAL LOW (ref 6.5–8.1)

## 2024-01-11 LAB — GLUCOSE, CAPILLARY
Glucose-Capillary: 133 mg/dL — ABNORMAL HIGH (ref 70–99)
Glucose-Capillary: 137 mg/dL — ABNORMAL HIGH (ref 70–99)
Glucose-Capillary: 142 mg/dL — ABNORMAL HIGH (ref 70–99)
Glucose-Capillary: 161 mg/dL — ABNORMAL HIGH (ref 70–99)
Glucose-Capillary: 163 mg/dL — ABNORMAL HIGH (ref 70–99)
Glucose-Capillary: 244 mg/dL — ABNORMAL HIGH (ref 70–99)
Glucose-Capillary: 296 mg/dL — ABNORMAL HIGH (ref 70–99)

## 2024-01-11 LAB — CBC
HCT: 24.2 % — ABNORMAL LOW (ref 39.0–52.0)
Hemoglobin: 7.6 g/dL — ABNORMAL LOW (ref 13.0–17.0)
MCH: 26.8 pg (ref 26.0–34.0)
MCHC: 31.4 g/dL (ref 30.0–36.0)
MCV: 85.2 fL (ref 80.0–100.0)
Platelets: 459 10*3/uL — ABNORMAL HIGH (ref 150–400)
RBC: 2.84 MIL/uL — ABNORMAL LOW (ref 4.22–5.81)
RDW: 19.6 % — ABNORMAL HIGH (ref 11.5–15.5)
WBC: 7.1 10*3/uL (ref 4.0–10.5)
nRBC: 0 % (ref 0.0–0.2)

## 2024-01-11 LAB — PREALBUMIN: Prealbumin: 15 mg/dL — ABNORMAL LOW (ref 18–38)

## 2024-01-11 LAB — TRIGLYCERIDES: Triglycerides: 83 mg/dL (ref <150)

## 2024-01-11 LAB — MAGNESIUM: Magnesium: 1.8 mg/dL (ref 1.7–2.4)

## 2024-01-11 LAB — PHOSPHORUS: Phosphorus: 3.7 mg/dL (ref 2.5–4.6)

## 2024-01-11 SURGERY — IRRIGATION AND DEBRIDEMENT ABSCESS
Anesthesia: General

## 2024-01-11 MED ORDER — ONDANSETRON HCL 4 MG/2ML IJ SOLN
INTRAMUSCULAR | Status: DC | PRN
  Administered 2024-01-11: 4 mg via INTRAVENOUS

## 2024-01-11 MED ORDER — ROCURONIUM BROMIDE 10 MG/ML (PF) SYRINGE
PREFILLED_SYRINGE | INTRAVENOUS | Status: DC | PRN
  Administered 2024-01-11: 30 mg via INTRAVENOUS

## 2024-01-11 MED ORDER — FENTANYL CITRATE (PF) 100 MCG/2ML IJ SOLN
INTRAMUSCULAR | Status: DC | PRN
  Administered 2024-01-11: 50 ug via INTRAVENOUS
  Administered 2024-01-11: 100 ug via INTRAVENOUS

## 2024-01-11 MED ORDER — 0.9 % SODIUM CHLORIDE (POUR BTL) OPTIME
TOPICAL | Status: DC | PRN
  Administered 2024-01-11: 1000 mL

## 2024-01-11 MED ORDER — LIDOCAINE HCL (PF) 2 % IJ SOLN
INTRAMUSCULAR | Status: AC
  Filled 2024-01-11: qty 5

## 2024-01-11 MED ORDER — ROCURONIUM BROMIDE 10 MG/ML (PF) SYRINGE
PREFILLED_SYRINGE | INTRAVENOUS | Status: AC
  Filled 2024-01-11: qty 10

## 2024-01-11 MED ORDER — BUPIVACAINE-EPINEPHRINE (PF) 0.25% -1:200000 IJ SOLN
INTRAMUSCULAR | Status: AC
  Filled 2024-01-11: qty 30

## 2024-01-11 MED ORDER — TRAVASOL 10 % IV SOLN
INTRAVENOUS | Status: AC
  Filled 2024-01-11: qty 1231.2

## 2024-01-11 MED ORDER — PROPOFOL 10 MG/ML IV BOLUS
INTRAVENOUS | Status: DC | PRN
  Administered 2024-01-11: 100 mg via INTRAVENOUS

## 2024-01-11 MED ORDER — MIDAZOLAM HCL 2 MG/2ML IJ SOLN
INTRAMUSCULAR | Status: AC
  Filled 2024-01-11: qty 2

## 2024-01-11 MED ORDER — SUGAMMADEX SODIUM 200 MG/2ML IV SOLN
INTRAVENOUS | Status: DC | PRN
  Administered 2024-01-11: 200 mg via INTRAVENOUS

## 2024-01-11 MED ORDER — FENTANYL CITRATE PF 50 MCG/ML IJ SOSY
25.0000 ug | PREFILLED_SYRINGE | INTRAMUSCULAR | Status: DC | PRN
  Administered 2024-01-11: 50 ug via INTRAVENOUS

## 2024-01-11 MED ORDER — FENTANYL CITRATE (PF) 100 MCG/2ML IJ SOLN
INTRAMUSCULAR | Status: AC
  Filled 2024-01-11: qty 2

## 2024-01-11 MED ORDER — CHLORHEXIDINE GLUCONATE 0.12 % MT SOLN
15.0000 mL | Freq: Once | OROMUCOSAL | Status: AC
  Administered 2024-01-11: 15 mL via OROMUCOSAL

## 2024-01-11 MED ORDER — PROPOFOL 10 MG/ML IV BOLUS
INTRAVENOUS | Status: AC
  Filled 2024-01-11: qty 20

## 2024-01-11 MED ORDER — PIPERACILLIN-TAZOBACTAM 3.375 G IVPB
INTRAVENOUS | Status: AC
  Filled 2024-01-11: qty 50

## 2024-01-11 MED ORDER — FENTANYL CITRATE PF 50 MCG/ML IJ SOSY
PREFILLED_SYRINGE | INTRAMUSCULAR | Status: AC
  Filled 2024-01-11: qty 1

## 2024-01-11 MED ORDER — LACTATED RINGERS IV SOLN
INTRAVENOUS | Status: DC | PRN
Start: 2024-01-11 — End: 2024-01-11

## 2024-01-11 MED ORDER — MIDAZOLAM HCL 2 MG/2ML IJ SOLN
INTRAMUSCULAR | Status: DC | PRN
  Administered 2024-01-11: 2 mg via INTRAVENOUS

## 2024-01-11 MED ORDER — DEXAMETHASONE SODIUM PHOSPHATE 10 MG/ML IJ SOLN
INTRAMUSCULAR | Status: DC | PRN
  Administered 2024-01-11: 4 mg via INTRAVENOUS

## 2024-01-11 SURGICAL SUPPLY — 38 items
BAG COUNTER SPONGE SURGICOUNT (BAG) IMPLANT
BNDG GAUZE DERMACEA FLUFF 4 (GAUZE/BANDAGES/DRESSINGS) IMPLANT
CANISTER WOUNDNEG PRESSURE 500 (CANNISTER) IMPLANT
COVER SURGICAL LIGHT HANDLE (MISCELLANEOUS) ×1 IMPLANT
DERMABOND ADVANCED .7 DNX12 (GAUZE/BANDAGES/DRESSINGS) IMPLANT
DRAPE LAPAROSCOPIC ABDOMINAL (DRAPES) IMPLANT
DRAPE LAPAROTOMY T 102X78X121 (DRAPES) IMPLANT
DRAPE LAPAROTOMY T 98X78 PEDS (DRAPES) IMPLANT
DRAPE LAPAROTOMY TRNSV 102X78 (DRAPES) IMPLANT
DRAPE SHEET LG 3/4 BI-LAMINATE (DRAPES) IMPLANT
DRAPE UTILITY XL STRL (DRAPES) ×1 IMPLANT
DRSG VAC GRANUFOAM MED (GAUZE/BANDAGES/DRESSINGS) IMPLANT
DRSG VERSA FOAM LRG 10X15 (GAUZE/BANDAGES/DRESSINGS) IMPLANT
ELECT CAUTERY BLADE TIP 2.5 (TIP) IMPLANT
ELECT REM PT RETURN 15FT ADLT (MISCELLANEOUS) ×1 IMPLANT
ELECTRODE CAUTERY BLDE TIP 2.5 (TIP) IMPLANT
GAUZE PACKING IODOFORM 1/4X15 (PACKING) IMPLANT
GAUZE PAD ABD 8X10 STRL (GAUZE/BANDAGES/DRESSINGS) IMPLANT
GAUZE SPONGE 4X4 12PLY STRL (GAUZE/BANDAGES/DRESSINGS) ×1 IMPLANT
GLOVE BIO SURGEON STRL SZ7.5 (GLOVE) ×1 IMPLANT
GLOVE INDICATOR 8.0 STRL GRN (GLOVE) ×1 IMPLANT
GOWN STRL REUS W/ TWL XL LVL3 (GOWN DISPOSABLE) ×2 IMPLANT
KIT BASIN OR (CUSTOM PROCEDURE TRAY) ×1 IMPLANT
KIT TURNOVER KIT A (KITS) IMPLANT
MARKER SKIN DUAL TIP RULER LAB (MISCELLANEOUS) IMPLANT
NDL HYPO 25X1 1.5 SAFETY (NEEDLE) ×1 IMPLANT
NEEDLE HYPO 25X1 1.5 SAFETY (NEEDLE) ×1 IMPLANT
PACK GENERAL/GYN (CUSTOM PROCEDURE TRAY) ×1 IMPLANT
SET HNDPC FAN SPRY TIP SCT (DISPOSABLE) IMPLANT
SPIKE FLUID TRANSFER (MISCELLANEOUS) IMPLANT
SPONGE T-LAP 4X18 ~~LOC~~+RFID (SPONGE) IMPLANT
STAPLER SKIN PROX WIDE 3.9 (STAPLE) IMPLANT
SUT MNCRL AB 4-0 PS2 18 (SUTURE) IMPLANT
SUT VIC AB 3-0 SH 18 (SUTURE) IMPLANT
SWAB COLLECTION DEVICE MRSA (MISCELLANEOUS) IMPLANT
SWAB CULTURE ESWAB REG 1ML (MISCELLANEOUS) IMPLANT
SYR CONTROL 10ML LL (SYRINGE) ×1 IMPLANT
TOWEL OR 17X26 10 PK STRL BLUE (TOWEL DISPOSABLE) ×1 IMPLANT

## 2024-01-11 NOTE — Anesthesia Procedure Notes (Signed)
 Procedure Name: Intubation Date/Time: 01/11/2024 2:10 PM  Performed by: Ulanda Gambles, CRNAPre-anesthesia Checklist: Patient identified, Emergency Drugs available, Suction available and Patient being monitored Patient Re-evaluated:Patient Re-evaluated prior to induction Oxygen Delivery Method: Circle system utilized Preoxygenation: Pre-oxygenation with 100% oxygen Induction Type: IV induction Ventilation: Mask ventilation without difficulty Laryngoscope Size: Mac and 4 Grade View: Grade I Tube type: Oral Tube size: 7.5 mm Number of attempts: 1 Airway Equipment and Method: Stylet Placement Confirmation: ETT inserted through vocal cords under direct vision, positive ETCO2 and breath sounds checked- equal and bilateral Secured at: 23 cm Tube secured with: Tape Dental Injury: Teeth and Oropharynx as per pre-operative assessment

## 2024-01-11 NOTE — Transfer of Care (Signed)
 Immediate Anesthesia Transfer of Care Note  Patient: Steven [name]  Procedure(s) Performed: EUA, WOUND VAC CHANGE, EXCISIONAL DEBRIDEMENT OF TISSUE,  MUSCLE AND PERINIEUM  Patient Location: PACU  Anesthesia Type:General  Level of Consciousness: drowsy  Airway & Oxygen Therapy: Patient Spontanous Breathing and Patient connected to face mask oxygen  Post-op Assessment: Report given to RN, Post -op Vital signs reviewed and stable, and Patient moving all extremities X 4  Post vital signs: Reviewed and stable  Last Vitals:  Vitals Value Taken Time  BP 112/61   Temp    Pulse 88 01/11/24 1526  Resp 15 01/11/24 1526  SpO2 100 % 01/11/24 1526    Last Pain:  Vitals:   01/11/24 1211  TempSrc: Oral  PainSc:       Patients Stated Pain Goal: 5 (01/07/24 1046)  Complications: No notable events documented.

## 2024-01-11 NOTE — Op Note (Signed)
 01/11/2024  4:33 PM  PATIENT:  Steven [name]  53 y.o. male  PRE-OPERATIVE DIAGNOSIS:  PERINEAL & SACRAL WOUND  POST-OPERATIVE DIAGNOSIS:  PERINEAL & SACRAL WOUND; ABDOMINAL WALL ABSCESS  PROCEDURE:  Procedure(s): EUA, WOUND VAC CHANGE, EXCISIONAL DEBRIDEMENT OF TISSUE & MUSCLE OF SACRUM with scalpel; drainage of right abdominal wall abscess  SURGEON:  Surgeon(s): Gaynelle Adu, MD  ASSISTANTS: none   ANESTHESIA:   general  DRAINS: Urinary Catheter (Foley)   LOCAL MEDICATIONS USED:  NONE  SPECIMEN:  Source of Specimen:  sacral soft tissue & muscled  DISPOSITION OF SPECIMEN:   discarded  [name]:  YES  INDICATION FOR PROCEDURE: 53 year old man with a history of adenocarcinoma with a history of APR with wound dehiscence and wound breakdown.  The patient has a wound VAC over his sacrum and perineum and has been going back to the OR for multiple wound VAC changes given the complexity of the wound as well as intermittent excisional debridements.  Patient was brought back to the operating room for exam under anesthesia and wound VAC change  During his last trip to the OR on April 10 the Foley catheter was visualized at the base of the cavity so urology was brought in.  It appeared the patient had developed a bladder cystotomy or fistula to the perineal/sacral wound.  The fibrotic tissue with the posterior wall of the bladder was closed with interrupted Vicryl's by urology on April 10  PROCEDURE: After obtaining informed consent the patient was taken the OR to at [facility].  General endotracheal anesthesia was established.  The patient was then placed in the prone position on the OR table with the appropriate padding.  Sequential compression devices were placed.  Patient is on scheduled therapeutic antibiotics.  The external wound VAC dressing and black sponge were removed.  The buttocks, perineum were prepped and draped in usual standard surgical fashion with Betadine.  Timeout  was performed.  The white sponge in the lower aspect of the wound was removed.  At the last dressing change the inferior portion of the perineum had been reapproximated to minimize some of the wound.  The deeper wound cavity was evaluated.  This cavity was anterior to the coccyx.  I could visualize the Vicryl sutures from the bladder repair from last week.  They appeared intact.  There was probably about 20 or 30 cc of purulence in this cavity.  There is also brownish drainage but not a significant amount.  It was unclear if it was succus or just bad purulence.  Nonetheless no additional brownish fluid came out.  This deeper cavity measured about 10 x 8 x 9.  Superficial to this on top of the coccyx was the larger area that was open.  There was some devitalized nonviable soft tissue and muscle on either side of the coccyx which was sharply debrided with scalpel.  This measured about 5 x 6 cm.  As previously described there was a cavity extending into the left gluteus muscles that had been packed with a black sponge which had been removed.  This area was nice and viable and had good granulation tissue.  The more superficial wound cavity (18 x 15 cm) for the most part looked good except for the nonviable tissue around the coccyx on either side which had been sharply debrided today with both scalpel and currette.  The tissue that Dr. Derrell Lolling had reapproximated last week and the anterior portion of the perineum was still intact and closed.  I obtained a new white sponge and placed it in the deeper cavity and then we obtained a medium black sponge and I cut a portion of it and placed it into the left gluteus cavity that tracked for about 10 cm x 5 cm and then we placed remaining black sponge over the larger wound on top of the white sponge.  The patient's skin on the right buttock was getting denuded so I placed a Mepilex on the skin directly and then we placed the wound VAC plastic drape on top of that extending to  the contralateral buttock.  A small circular defect was made in the wound VAC plastic dressing and the wound VAC suction tubing was applied and we had a good seal.  No air leak.  The patient was then placed in the supine position.  The ostomy in the right abdomen was inspected.  Dr. Melton Squires had drained a subcutaneous abscess that was at the 9:00 into the area around the actual ostomy.  There was still fluctuance extending at the 9 o'clock position for about 8 cm and some mild cellulitis.  Using a Q-tip I opened up the area that Dr. Melton Squires had opened up next to the ostomy and I was able to milk out about 15 to 20 cc of seropurulent fluid out from the lateral aspect.  I then packed the cavity with half-inch packing strip.  We then cut a new ostomy appliance in place and over the ostomy.  All needle, instrument, and sponge [name] were correct x 2.  There were no immediate complications.  The patient was extubated and taken to the recovery room in stable condition    Tool used for debridement (curette, scapel, etc.) scalpel and curette     Frequency of surgical debridement.   7th     Measurement of total devitalized tissue (wound surface) before and after surgical debridement.   18 x 15cm     Area and depth of devitalized tissue removed from wound.  5 x6cm    Blood loss and description of tissue removed.  10cc, soft tissue and muscle     Evidence of the progress of the wound's response to treatment.             A.  Current wound volume (current dimensions and depth).  18 x 15 cm (superficially); deeper cavity (10x8x9cm); L gluteus track (10x 5 x 1cm)             B.  Presence (and extent of) of infection.  yes             C.  Presence (and extent of) of non viable tissue.  yes             D.  Other material in the wound that is expected to inhibit healing.  no   8.  Was there any viable tissue removed (measurements): no  PLAN OF CARE:  already inpt  PATIENT DISPOSITION:  PACU - hemodynamically  stable.   Delay start of Pharmacological VTE agent (>24hrs) due to surgical blood loss or risk of bleeding:  no  Marianna Shirk. Elvan Hamel, MD, FACS General, Bariatric, & Minimally Invasive Surgery [facility], [place]

## 2024-01-11 NOTE — Anesthesia Preprocedure Evaluation (Addendum)
 Anesthesia Evaluation  Patient identified by MRN, date of birth, ID band Patient awake    Reviewed: Allergy & Precautions, NPO status , Patient's Chart, lab work & pertinent test results  Airway Mallampati: II  TM Distance: >3 FB Neck ROM: Full    Dental no notable dental hx.    Pulmonary asthma , sleep apnea    Pulmonary exam normal        Cardiovascular hypertension, + Peripheral Vascular Disease   Rhythm:Regular Rate:Normal     Neuro/Psych   Anxiety Depression    negative neurological ROS     GI/Hepatic ,GERD  ,,Recurrent rectal cancer complicated by chronic abscesses, wound care issues, fistulas on palliative care.    Endo/Other  diabetes, Type 2, Oral Hypoglycemic Agents    Renal/GU   negative genitourinary   Musculoskeletal   Abdominal Normal abdominal exam  (+)   Peds  Hematology  (+) Blood dyscrasia, anemia Lab Results      Component                Value               Date                      WBC                      7.1                 01/11/2024                HGB                      7.6 (L)             01/11/2024                HCT                      24.2 (L)            01/11/2024                MCV                      85.2                01/11/2024                PLT                      459 (H)             01/11/2024             Lab Results      Component                Value               Date                      NA                       133 (L)             01/11/2024                K  4.2                 01/11/2024                CO2                      25                  01/11/2024                GLUCOSE                  134 (H)             01/11/2024                BUN                      22 (H)              01/11/2024                CREATININE               0.51 (L)            01/11/2024                CALCIUM                  7.8 (L)             01/11/2024                 GFR                      93.99               03/05/2021                GFRNONAA                 >60                 01/11/2024              Anesthesia Other Findings   Reproductive/Obstetrics                             Anesthesia Physical Anesthesia Plan  ASA: 3  Anesthesia Plan: General   Post-op Pain Management: Tylenol PO (pre-op)* and Gabapentin PO (pre-op)*   Induction: Intravenous  PONV Risk Score and Plan: 2 and Ondansetron, Dexamethasone, Midazolam and Treatment may vary due to age or medical condition  Airway Management Planned: Mask and Oral ETT  Additional Equipment: None  Intra-op Plan:   Post-operative Plan: Extubation in OR  Informed Consent: I have reviewed the patients History and Physical, chart, labs and discussed the procedure including the risks, benefits and alternatives for the proposed anesthesia with the patient or authorized representative who has indicated his/her understanding and acceptance.     Dental advisory given  Plan Discussed with: CRNA  Anesthesia Plan Comments:        Anesthesia Quick Evaluation

## 2024-01-11 NOTE — Progress Notes (Signed)
 PHARMACY - TOTAL PARENTERAL NUTRITION CONSULT NOTE   Indication: EC Fistula   Patient Measurements: Height: 5\' 10"  (177.8 cm) Weight: 83 kg (182 lb 15.7 oz) IBW/kg (Calculated) : 73 TPN AdjBW (KG): 80.2 Body mass index is 26.26 kg/m. Usual Weight:   Assessment:  52 yoM w/ hx DM2, PE on Xarelto, stage II rectal cancer s/p chemoradiation. Underwent low anterior & abdominoperineal resections with ileostomy in 2022, redo LAR in 2024, ultimately end colostomy placement March 2025; admitted 3/28 for sacrococcygeal ulceration with necrosis & bleeding. Prior to admission, patient did not have issues tolerating solid foods and then as inpatient was able to take some solid food while on soft diet. Notable weight loss of 7% over 1 month. Pharmacy consulted to manage TPN for nutrition support on 4/2 (admission day 6) for EC fistula. While fistula output is not high, has chronic pelvic abscess with rectal stump disruption and ongoing leaking. 4/10 in OR abscess interrogated and drained into ostomy, underwent debridement of perineal wound.  4/8 recalculated TPN goal rate per RD recommendation, see below for macros  Glucose / Insulin: T2DM, A1c 7.5 4/14: CBGs much improved - 119-163 over last 24hrs - 50 units of insulin still in TPN bag, 8 units of SSI used 4/13 CBGs 126-227 with 50 units of insulin in TPN & advancing diet 4/12: CBGs 78-132 with 50 units of insulin in TPN 4/11: CBGs 63-99 with 70 u insulin in TPN; patient requested mango ice/apple juice for CBG 63 4/10 unexpected response with CBGs 91-103 as patient was previously 107-178 with 80u in TPN, initiated D10W to run at 30 mL/h  4/9 Semglee 40 & SSI given outside of TPN, failed to include in TPN upon re-order for adjusted RD recommendations Electrolytes (4/11): Na 133 stable (max in TPN), others WNL Renal (4/11): Scr 0.51, BUN 22, both stable Hepatic (4/10): LFTs WNL, AlkPhos 137, albumin <1.5 I/O: 3500 ml UOP, 450 mL out in drain  GI  Imaging:  4/1 CT A/P: combination of oral contrast and air between bowel and sacrococcygeal wound suggesting combination of perforation/fistulization and necrotic infection GI Surgeries / Procedures:  3/28 EUA for bleeding from perineal wound 4/2: debridement and wound vac placement 4/4  wound exploration, VAC dressing change 4/7 repeat dressing change 4/10 I&D of perineal wound, abdominal wall abscess  Central access: PICC ordered for TPN 4/2 TPN start date: 12/30/2023  Nutritional Goals: Initial goal rate 85 mL/hr (provides 108 g of protein and 2085 kcals per day) 4/8 Updated goal rate 90 mL/h provides 123 g protein and 2132 kcal   RD Assessment: Last updated 4/8 Estimated Needs Total Energy Estimated Needs: 2100-2350 kcals Total Protein Estimated Needs: 120-135 grams Total Fluid Estimated Needs: >/= 2.1L   Current Nutrition:  TPN + DYS 1 (4/12) 4/6 resource breeze TID; Per RN, patient refused due to sugar content 4/9 Did not tolerate trial of Juven due to taste 4/12 advanced to dysphagia 1/full liquid diet 4/14: back to NPO  Plan:  Continue TPN at goal rate of 90 mL/hr; Electrolytes in TPN: Na 150 mEq/L, K 45 mEq/L, Ca 5 mEq/L, Mg to 8 mEq/L, Phos 15 mmol/L, Cl:Ac ratio - 1:1 Add standard MVI and trace elements to TPN Chromium on hold d/t national shortage Continue 50 units insulin in TPN Continue sensitive SSI q4h (advancing diet) Monitor TPN labs on Mon/Thurs, PRN; next labs 4/14 AM F/U advancement of diet and ostomy output   Steven [name], PharmD, BCPS Secure Chat if ?s 01/11/2024 9:51 AM

## 2024-01-11 NOTE — Progress Notes (Signed)
 Patient seen in the room today.  We reviewed the events that have happened in the last 10 days of my absence.  We reviewed that his pelvis and perineum do not appear to be healing, most likely a result of significant radiation, uncontrolled diabetes and pressure.  He has been having wound VAC changes twice a week.  We discussed the bladder erosion and the chances of this healing.  I do not believe that there is any further escalation of care that we can do here at this hospital.  We discussed transferring to a tertiary care center.  They do not have a strong preference on location at this point.  I will discuss with my colleagues and attempt to make some contacts at a tertiary center to discuss possible additional treatment options.    Fernande Howells, MD  Colorectal and General Surgery [facility]

## 2024-01-11 NOTE — Anesthesia Postprocedure Evaluation (Signed)
 Anesthesia Post Note  Patient: Steven [name]  Procedure(s) Performed: EUA, WOUND VAC CHANGE, EXCISIONAL DEBRIDEMENT OF TISSUE,  MUSCLE AND PERINIEUM.     Patient location during evaluation: PACU Anesthesia Type: General Level of consciousness: awake Pain management: pain level controlled Vital Signs Assessment: post-procedure vital signs reviewed and stable Respiratory status: spontaneous breathing, nonlabored ventilation and respiratory function stable Cardiovascular status: blood pressure returned to baseline and stable Postop Assessment: no apparent nausea or vomiting Anesthetic complications: no   No notable events documented.  Last Vitals:  Vitals:   01/11/24 1640 01/11/24 1700  BP:    Pulse: 89 89  Resp: 12 13  Temp:    SpO2: 94% 92%    Last Pain:  Vitals:   01/11/24 1730  TempSrc:   PainSc: 6                  Daiki Dicostanzo P Benjamen Koelling

## 2024-01-11 NOTE — Progress Notes (Signed)
 Daily Progress Note   Patient Name: Steven [name]       Date: 01/11/2024 DOB: 03-20-1971  Age: 53 y.o. MRN#: [id number] Attending Physician: Steven Limbo, MD Primary Care Physician: Steven Retort, MD Admit Date: 12/25/2023 Length of Stay: 17 days  Reason for Consultation/Follow-up: Pain control  Subjective:   CC: Patient laying in bed sleeping soundly.  Following up regarding pain control.  Subjective:  Reviewed EMR prior to presenting to bedside.  At time of EMR review in past 24 hours patient has received as needed IV Dilaudid 2 mg x 2 doses, as needed IV Dilaudid 1 mg x 2 doses, and as needed oxycodone 15 mg x 3 doses.  Patient also received as needed IV Ativan 0.5 mg x 2 doses in past 24 hours.  Patient remains on Klonopin 0.25 mg twice daily.  Also receiving MS Contin 30 mg every 8 hours started 01/09/24.  Review of CMP obtained today noted BUN 22, creatinine 0.51, and albumin 1.5.  Patient's prealbumin obtained 01/11/2024 noted to be 15.  Presented to bedside to see patient.  Patient sleeping very soundly when presenting to bedside.  Patient's wife present.  Wife noted that patient received as needed Ativan this morning for anxiety.  Wife noted that patient became anxious when surgeon presented to bedside to discuss care with patient.  Plan is for patient to go to the OR today.  Surgeon discussed that based on patient's potential plastic surgery needs for the future, recommending higher level of care.  Wife noted that transfer of care to [place], [place], or [facility] is being considered.  This caused patient a lot of anxiety which is why he needs Ativan.  Acknowledged anxiety associated with this difficult information.  Wife expressed difficulties in patient moving further away as this would prevent her from seeing him daily.  Spent time providing emotional support via active listening. Discussed in terms of symptoms at this time since patient is currently sleeping and will be going to OR later today,  will hold on medication adjustment.  Noted palliative medicine team will continue to follow along to assist with symptom management and support.  All questions answered at that time.  Discussed care with RN for updates.  Patient going to OR shortly.  Objective:   Vital Signs:  BP (!) 114/55   Pulse 89   Temp 98.4 F (36.9 C) (Oral)   Resp 15   Ht 5\' 10"  (1.778 m)   Wt 83 kg   SpO2 96%   BMI 26.26 kg/m   Physical Exam: General: Sleeping soundly, chronically ill-appearing Cardiovascular: RRR Respiratory: no increased work of breathing noted, not in respiratory distress Neuro: Sleeping  Imaging: I personally reviewed recent imaging.   Assessment & Plan:   Assessment: Patient is a 53 year old male with a past medical history of diabetes mellitus type 2, VTE on Xarelto, stage II rectal adenocarcinoma status post neoadjuvant concurrent chemoradiation in 2022 followed by a low anterior resection with a complicated postoperative course including need for ostomy with chronic pelvic abscess who was admitted on 12/25/2023 under general surgery service for management of perirectal wound bleeding. During hospitalization, patient has required multiple transfusions for acute blood loss anemia secondary to perineal wound. 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 Patient also had complication with bladder fistula and is now requiring Foley. If foley not working appropraite, may need to consider further urological intervention. Palliative  medicine team consulted to assist with pain management.   Recommendations/Plan:  # Complex medical decision making/goals of care  - Have had discussions with patient, patient's wife, and patient's brother regarding medical care moving forward.  Patient continuing to hope for improvement and so continuing with full scope of medical interventions at  this time. Last discussed 4/13.  Informed patient of information contained in ACP documentation should he want to complete at 1 point.  Could involve chaplain if needed.  Palliative medicine team will continue to follow along and engage in conversations as able and appropriate.     Code Status: Full Code  # Symptom management: Patient is receiving these palliative interventions for symptom management with an intent to improve quality of life.                 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                                 - Continue MS Contin to 30 mg every 8 hours during the day.  Based on OME requirements in past 24 hours, could consider increasing long-acting dosing.  Based on examination today, holding on this increase.  Palliative medicine team will continue to follow along to as just as appropriate.                               - Continue oxycodone 10-15 mg to every 3 hours as needed                               - Continue IV hydromorphone to 2 mg every 2 hours as needed                               - Continue Tylenol to 1000 mg tab every 8 hours                  - Anxiety                               - Continue IV Ativan 0.5 mg every 6 hours as needed    - Continue Klonopin 0.25 mg twice daily.  May need increases in future.  # Psycho-social/Spiritual Support:  - Support System: Wife  # Discharge Planning: To Be Determined  Discussed with: Patient sleeping soundly when seen, discussed with patient's wife, RN  Thank you for allowing the palliative care team to participate in the care Steven [name].  Steven Morin, DO Palliative Care Provider PMT # [phone]  If patient remains symptomatic despite maximum doses, please call PMT at [phone] between 0700 and 1900. Outside of these hours, please call attending, as PMT does not have night coverage.   Personally  spent 35 minutes in patient care including extensive chart review (labs, imaging, progress/consult notes, vital signs), medically appropraite exam, discussed with treatment team, education to patient, family, and staff, documenting clinical information, medication review and management, coordination of care, and available advanced directive documents.

## 2024-01-11 NOTE — Plan of Care (Signed)
  Problem: Education: Goal: Ability to describe self-care measures that may prevent or decrease complications (Diabetes Survival Skills Education) will improve Outcome: Progressing   Problem: Coping: Goal: Ability to adjust to condition or change in health will improve Outcome: Progressing   Problem: Health Behavior/Discharge Planning: Goal: Ability to manage health-related needs will improve Outcome: Progressing   

## 2024-01-11 NOTE — Interval H&P Note (Signed)
 History and Physical Interval Note:  01/11/2024 1:55 PM  Steven [name]  has presented today for surgery, with the diagnosis of PERINEAL WOUND.  The various methods of treatment have been discussed with the patient and family. After consideration of risks, benefits and other options for treatment, the patient has consented to  Procedure(s): IRRIGATION AND DEBRIDEMENT ABSCESS (N/A) as a surgical intervention.  The patient's history has been reviewed, patient examined, no change in status, stable for surgery.  I have reviewed the patient's chart and labs.  Questions were answered to the patient's satisfaction.     Wound vac change  Aldean Hummingbird

## 2024-01-11 NOTE — Plan of Care (Signed)
  Problem: Education: Goal: Ability to describe self-care measures that may prevent or decrease complications (Diabetes Survival Skills Education) will improve Outcome: Progressing Goal: Individualized Educational Video(s) Outcome: Progressing   Problem: Coping: Goal: Ability to adjust to condition or change in health will improve Outcome: Progressing   Problem: Fluid Volume: Goal: Ability to maintain a balanced intake and output will improve Outcome: Progressing   Problem: Health Behavior/Discharge Planning: Goal: Ability to identify and utilize available resources and services will improve Outcome: Progressing Goal: Ability to manage health-related needs will improve Outcome: Progressing   Problem: Metabolic: Goal: Ability to maintain appropriate glucose levels will improve Outcome: Progressing   Problem: Skin Integrity: Goal: Risk for impaired skin integrity will decrease Outcome: Progressing   Problem: Tissue Perfusion: Goal: Adequacy of tissue perfusion will improve Outcome: Progressing   Problem: Education: Goal: Knowledge of General Education information will improve Description: Including pain rating scale, medication(s)/side effects and non-pharmacologic comfort measures Outcome: Progressing   Problem: Health Behavior/Discharge Planning: Goal: Ability to manage health-related needs will improve Outcome: Progressing   Problem: Clinical Measurements: Goal: Ability to maintain clinical measurements within normal limits will improve Outcome: Progressing Goal: Will remain free from infection Outcome: Progressing Goal: Diagnostic test results will improve Outcome: Progressing Goal: Respiratory complications will improve Outcome: Progressing Goal: Cardiovascular complication will be avoided Outcome: Progressing   Problem: Coping: Goal: Level of anxiety will decrease Outcome: Progressing   Problem: Elimination: Goal: Will not experience complications related  to bowel motility Outcome: Progressing Goal: Will not experience complications related to urinary retention Outcome: Progressing   Problem: Pain Managment: Goal: General experience of comfort will improve and/or be controlled Outcome: Progressing   Problem: Safety: Goal: Ability to remain free from injury will improve Outcome: Progressing   Problem: Skin Integrity: Goal: Risk for impaired skin integrity will decrease Outcome: Progressing

## 2024-01-11 NOTE — Progress Notes (Signed)
   4 Days Post-Op Subjective: Met with Sam Creighton, his wife, and family friend. Reviewed his case and answered questions as they pertained to Urology. Pt was emotional through most of the visit, but expressed understanding. Stable with good UOP  Objective: Vital signs in last 24 hours: Temp:  [97.8 F (36.6 C)-98.7 F (37.1 C)] 98.7 F (37.1 C) (04/14 0800) Pulse Rate:  [88-103] 95 (04/14 0822) Resp:  [10-20] 10 (04/14 0822) BP: (104-129)/(50-61) 106/56 (04/14 0800) SpO2:  [94 %-99 %] 97 % (04/14 0822) Weight:  [83 kg] 83 kg (04/14 0500)  Assessment/Plan: #bladder cystotomy  Urology assisted with general surgery by closing inadvertent cystotomy developed while irrigating/exploring peritoneal abscess.  Questionable probability of healing considering radiation changes.  Keep Foley catheter in place for the time being and after patient has had several weeks to heal can consider CT cystogram on an outpatient basis.  If no extravasation will remove Foley at that time.   If Foley catheter fails or fistulous track reopens, would need to place bilateral percutaneous nephrostomy tubes. Back to OR today for wound vac exchange. GS willl be able to visualize wound bed. As of now, good UOP, clear yellow with sediment  Intake/Output from previous day: 04/13 0701 - 04/14 0700 In: 2638 [P.O.:240; I.V.:2242.3; IV Piggyback:155.7] Out: 1800 [Urine:1600; Drains:200]  Intake/Output this shift: Total I/O In: 236.6 [I.V.:209.7; IV Piggyback:27] Out: 550 [Urine:550]  Physical Exam:  General: Alert and oriented CV: No cyanosis Lungs: equal chest rise Gu: Foley catheter in place draining clear yellow urine  Lab Results: Recent Labs    01/11/24 0454  HGB 7.6*  HCT 24.2*   BMET Recent Labs    01/11/24 0454  NA 133*  K 4.2  CL 102  CO2 25  GLUCOSE 134*  BUN 22*  CREATININE 0.51*  CALCIUM 7.8*     Studies/Results: No results found.    LOS: 17 days   Alla Ar, NP [facility] Pager: [phone]  01/11/2024, 10:46 AM

## 2024-01-11 NOTE — Brief Op Note (Signed)
 01/11/2024  3:32 PM  PATIENT:  Steven [name]  53 y.o. male  PRE-OPERATIVE DIAGNOSIS:  PERINEAL WOUND  POST-OPERATIVE DIAGNOSIS:  PERINEAL WOUND  PROCEDURE:  Procedure(s): EUA, WOUND VAC CHANGE, EXCISIONAL DEBRIDEMENT OF TISSUE,  MUSCLE AND PERINIEUM (N/A) 5 x6 cm with a scalpel   SURGEON:  Surgeons and Role:    Aldean Hummingbird, MD - Primary  PHYSICIAN ASSISTANT:   ASSISTANTS: none   ANESTHESIA:   general  EBL:  20 mL   BLOOD ADMINISTERED:none  DRAINS: Urinary Catheter (Foley)   LOCAL MEDICATIONS USED:  NONE  SPECIMEN:  Source of Specimen:  soft tissue, muscle, tendon - discarded  DISPOSITION OF SPECIMEN:   discarded  COUNTS:  YES  TOURNIQUET:  * No tourniquets in log *  DICTATION: .Dragon Dictation  PLAN OF CARE:  already inpt  PATIENT DISPOSITION:  PACU - hemodynamically stable.   Delay start of Pharmacological VTE agent (>24hrs) due to surgical blood loss or risk of bleeding: no  Steven [name]. Steven Hamel, MD, FACS General, Bariatric, & Minimally Invasive Surgery [facility],  [facility]

## 2024-01-12 ENCOUNTER — Encounter (HOSPITAL_COMMUNITY): Payer: Self-pay | Admitting: General Surgery

## 2024-01-12 LAB — GLUCOSE, CAPILLARY
Glucose-Capillary: 147 mg/dL — ABNORMAL HIGH (ref 70–99)
Glucose-Capillary: 219 mg/dL — ABNORMAL HIGH (ref 70–99)
Glucose-Capillary: 227 mg/dL — ABNORMAL HIGH (ref 70–99)
Glucose-Capillary: 233 mg/dL — ABNORMAL HIGH (ref 70–99)
Glucose-Capillary: 270 mg/dL — ABNORMAL HIGH (ref 70–99)
Glucose-Capillary: 272 mg/dL — ABNORMAL HIGH (ref 70–99)

## 2024-01-12 LAB — BASIC METABOLIC PANEL WITH GFR
Anion gap: 5 (ref 5–15)
BUN: 27 mg/dL — ABNORMAL HIGH (ref 6–20)
CO2: 27 mmol/L (ref 22–32)
Calcium: 8.3 mg/dL — ABNORMAL LOW (ref 8.9–10.3)
Chloride: 104 mmol/L (ref 98–111)
Creatinine, Ser: 0.59 mg/dL — ABNORMAL LOW (ref 0.61–1.24)
GFR, Estimated: >60 mL/min (ref >60)
Glucose, Bld: 276 mg/dL — ABNORMAL HIGH (ref 70–99)
Potassium: 4.3 mmol/L (ref 3.5–5.1)
Sodium: 136 mmol/L (ref 135–145)

## 2024-01-12 MED ORDER — GLUCERNA SHAKE PO LIQD
237.0000 mL | Freq: Two times a day (BID) | ORAL | Status: DC
  Administered 2024-01-12 – 2024-01-13 (×3): 237 mL via ORAL
  Filled 2024-01-12 (×4): qty 237

## 2024-01-12 MED ORDER — TRAVASOL 10 % IV SOLN
INTRAVENOUS | Status: AC
  Filled 2024-01-12: qty 1231.2

## 2024-01-12 MED ORDER — ENSURE MAX PROTEIN PO LIQD
11.0000 [oz_av] | Freq: Every day | ORAL | Status: DC
  Administered 2024-01-12: 11 [oz_av] via ORAL
  Filled 2024-01-12 (×3): qty 330

## 2024-01-12 MED ORDER — ESCITALOPRAM OXALATE 10 MG PO TABS
5.0000 mg | ORAL_TABLET | Freq: Every day | ORAL | Status: DC
  Administered 2024-01-12: 5 mg via ORAL
  Filled 2024-01-12: qty 0.5

## 2024-01-12 MED ORDER — ENOXAPARIN SODIUM 30 MG/0.3ML IJ SOSY
30.0000 mg | PREFILLED_SYRINGE | Freq: Two times a day (BID) | INTRAMUSCULAR | Status: DC
  Administered 2024-01-12 – 2024-01-13 (×3): 30 mg via SUBCUTANEOUS
  Filled 2024-01-12 (×3): qty 0.3

## 2024-01-12 NOTE — Progress Notes (Signed)
 PHARMACY - TOTAL PARENTERAL NUTRITION CONSULT NOTE   Indication: EC Fistula   Patient Measurements: Height: 5\' 10"  (177.8 cm) Weight: 83 kg (182 lb 15.7 oz) IBW/kg (Calculated) : 73 TPN AdjBW (KG): 83 Body mass index is 26.26 kg/m. Usual Weight:   Assessment:  52 yoM w/ hx DM2, PE on Xarelto, stage II rectal cancer s/p chemoradiation. Underwent low anterior & abdominoperineal resections with ileostomy in 2022, redo LAR in 2024, ultimately end colostomy placement March 2025; admitted 3/28 for sacrococcygeal ulceration with necrosis & bleeding. Prior to admission, patient did not have issues tolerating solid foods and then as inpatient was able to take some solid food while on soft diet. Notable weight loss of 7% over 1 month. Pharmacy consulted to manage TPN for nutrition support on 4/2 (admission day 6) for EC fistula. While fistula output is not high, has chronic pelvic abscess with rectal stump disruption and ongoing leaking. 4/10 in OR abscess interrogated and drained into ostomy, underwent debridement of perineal wound.  4/8 recalculated TPN goal rate per RD recommendation, see below for macros  Glucose / Insulin: T2DM, A1c 7.5 4/15: CBGs rising again now > 200 but pt did have surgery yesterday and 1 dose of dexamethasone given pre-op, 50 units insulin remains in TPN, 20 units SSI given over last 24hr 4/14: CBGs much improved - 119-163 over last 24hrs - 50 units of insulin still in TPN bag, 8 units of SSI used 4/13 CBGs 126-227 with 50 units of insulin in TPN & advancing diet 4/12: CBGs 78-132 with 50 units of insulin in TPN 4/11: CBGs 63-99 with 70 u insulin in TPN; patient requested mango ice/apple juice for CBG 63 4/10 unexpected response with CBGs 91-103 as patient was previously 107-178 with 80u in TPN, initiated D10W to run at 30 mL/h  4/9 Semglee 40 & SSI given outside of TPN, failed to include in TPN upon re-order for adjusted RD recommendations Electrolytes (4/11): Na 133 stable  (max in TPN), others WNL Renal (4/11): Scr 0.51, BUN 22, both stable Hepatic (4/10): LFTs WNL, AlkPhos 137, albumin <1.5 I/O: 3500 ml UOP, 450 mL out in drain  GI Imaging:  4/1 CT A/P: combination of oral contrast and air between bowel and sacrococcygeal wound suggesting combination of perforation/fistulization and necrotic infection GI Surgeries / Procedures:  3/28 EUA for bleeding from perineal wound 4/2: debridement and wound vac placement 4/4  wound exploration, VAC dressing change 4/7 repeat dressing change 4/10 I&D of perineal wound, abdominal wall abscess  Central access: PICC ordered for TPN 4/2 TPN start date: 12/30/2023  Nutritional Goals: Initial goal rate 85 mL/hr (provides 108 g of protein and 2085 kcals per day) 4/8 Updated goal rate 90 mL/h provides 123 g protein and 2132 kcal   RD Assessment: Last updated 4/8 Estimated Needs Total Energy Estimated Needs: 2100-2350 kcals Total Protein Estimated Needs: 120-135 grams Total Fluid Estimated Needs: >/= 2.1L   Current Nutrition:  TPN + DYS 1 (4/12) 4/6 resource breeze TID; Per RN, patient refused due to sugar content 4/9 Did not tolerate trial of Juven due to taste 4/12 advanced to dysphagia 1/full liquid diet 4/14: back to NPO 4/15: diet advanced to FLD post op VAC change  Plan:  Continue TPN at goal rate of 90 mL/hr; Electrolytes in TPN: Na 150 mEq/L, K 45 mEq/L, Ca 5 mEq/L, Mg to 8 mEq/L, Phos 15 mmol/L, Cl:Ac ratio - 1:1 Add standard MVI and trace elements to TPN Chromium on hold d/t national shortage Continue  50 units insulin in TPN, but monitor CBGs closely to see if CBGs re-stabilize post op, otherwise will need to adjust Continue sensitive SSI q4h (advancing diet) Monitor TPN labs on Mon/Thurs, PRN F/U advancement of diet and ostomy output   Luise Saint, PharmD, BCPS Secure Chat if ?s 01/12/2024 9:44 AM

## 2024-01-12 NOTE — Progress Notes (Signed)
 1 Day Post-Op   Subjective/Chief Complaint: Overall good pain control today. Consistently sipping on clear liquids   Objective: Vital signs in last 24 hours: Temp:  [97.4 F (36.3 C)-98.8 F (37.1 C)] 98.3 F (36.8 C) (04/15 0753) Pulse Rate:  [76-95] 84 (04/15 0741) Resp:  [11-18] 13 (04/15 0741) BP: (104-149)/(51-65) 104/51 (04/15 0200) SpO2:  [92 %-100 %] 97 % (04/15 0741) Weight:  [83 kg] 83 kg (04/15 0500) Last BM Date : 01/08/24  Intake/Output from previous day: 04/14 0701 - 04/15 0700 In: 1711.5 [P.O.:120; I.V.:1446.6; IV Piggyback:144.9] Out: 3420 [Urine:3250; Drains:150; Blood:20] Intake/Output this shift: Total I/O In: -  Out: 525 [Urine:375; Drains:150]  General appearance: alert, cooperative, and no distress Resp: breathing comfortably Cardio: regular rate and rhythm GI: soft, non distended, ostomy with gas and small amount of liquid stool.    Lab Results:  Recent Labs    01/11/24 0454  WBC 7.1  HGB 7.6*  HCT 24.2*  PLT 459*   BMET Recent Labs    01/11/24 0454 01/12/24 0445  NA 133* 136  K 4.2 4.3  CL 102 104  CO2 25 27  GLUCOSE 134* 276*  BUN 22* 27*  CREATININE 0.51* 0.59*  CALCIUM 7.8* 8.3*   PT/INR No results for input(s): "LABPROT", "INR" in the last 72 hours. ABG No results for input(s): "PHART", "HCO3" in the last 72 hours.  Invalid input(s): "PCO2", "PO2"  Studies/Results: No results found.  Anti-infectives: Anti-infectives (From admission, onward)    Start     Dose/Rate Route Frequency Ordered Stop   01/11/24 1356  piperacillin-tazobactam (ZOSYN) 3.375 GM/50ML IVPB       Note to Pharmacy: Raymon Caldron C: cabinet override      01/11/24 1356 01/11/24 1436   01/04/24 1400  piperacillin-tazobactam (ZOSYN) IVPB 3.375 g       Note to Pharmacy: Pharmacy to check dosing >> approved by [facility]   3.375 g 12.5 mL/hr over 240 Minutes Intravenous Every 8 hours 01/04/24 1257     12/25/23 1800  piperacillin-tazobactam (ZOSYN) IVPB  3.375 g  Status:  Discontinued       Note to Pharmacy: Pharmacy to check dosing >> approved by [facility]   3.375 g 12.5 mL/hr over 240 Minutes Intravenous Every 8 hours 12/25/23 1713 01/04/24 1257       Assessment/Plan:  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 Intra-op urology c/s Dr. Valeta Gaudier. S/p closure of bladder cystotomy/fistula 4/10 Vac change yesterday wit no small bowel succus. Plan next vac change Thur Continue foley. UOP 1600 ml/24. Per urology cont foley as long as it is draining well. If stops draining or fistula reopens consider b/l perc neph Monitor abdominal wall abscess near stoma Hgb stable. Monitor bp and recheck am Continue IV Zosyn (3/28>>) Okay for FLD. TNA, PICC line Nystatin for rash lower abdomen/groin.  Palliative consulted to assist with pain and anxiety and adjustments made - appreciate their help   FEN: FLD, TPN ID: zosyn 3/28> VTE: SCDs - chemical ppx and xarelto held in setting of anemia   DM with hyperglycemia - on insulin ABL anemia with anemia of chronic disease History of VTE on xarelto at baseline. Hold full anticoag   LOS: 18 days    Fernande Howells, MD [facility] Surgery 01/12/2024, 9:10 AM Please see Amion  for pager number during day hours 7:00am-4:30pm

## 2024-01-12 NOTE — Plan of Care (Signed)
   Problem: Education: Goal: Ability to describe self-care measures that may prevent or decrease complications (Diabetes Survival Skills Education) will improve Outcome: Progressing Goal: Individualized Educational Video(s) Outcome: Progressing   Problem: Coping: Goal: Ability to adjust to condition or change in health will improve Outcome: Progressing

## 2024-01-12 NOTE — Progress Notes (Signed)
 Chaplain provided reflective listening, prayer and a compassionate presence to pt and his wife, Steven [name]. Additional support available upon request.  Chaplain Levern Reader, [facility]  01/12/24 1100  Spiritual Encounters  Type of Visit Follow up  Care provided to: Pt and family  Referral source Chaplain team  Reason for visit Routine spiritual support  OnCall Visit No  Spiritual Framework  Presenting Themes Goals in life/care;Meaning/purpose/sources of inspiration  Patient Stress Factors Exhausted;Health changes  Interventions  Spiritual Care Interventions Made Established relationship of care and support;Compassionate presence;Reflective listening;Prayer  Intervention Outcomes  Outcomes Connection to spiritual care;Reduced anxiety  Spiritual Care Plan  Spiritual Care Issues Still Outstanding No further spiritual care needs at this time (see row info)

## 2024-01-12 NOTE — Progress Notes (Signed)
 Daily Progress Note   Patient Name: Steven [name]       Date: 01/12/2024 DOB: 1971-05-14  Age: 53 y.o. MRN#: [id number] Attending Physician: Bishop Limbo, MD Primary Care Physician: Noberto Retort, MD Admit Date: 12/25/2023 Length of Stay: 18 days  Reason for Consultation/Follow-up: Pain control  Subjective:   CC: Patient awake alert resting in bed wife at bedside Following up regarding pain control.  Subjective:  Reviewed EMR prior to presenting to bedside. Patient complains of his anxiety not being controlled last night, we discussed about his current pain and non pain regimen, we talked about symptom management.   Objective:   Vital Signs:  BP 119/63   Pulse 86   Temp 98.3 F (36.8 C) (Oral)   Resp 13   Ht 5\' 10"  (1.778 m)   Wt 83 kg   SpO2 96%   BMI 26.26 kg/m   Physical Exam: General: awake alert, tearful, flat affect, chronically ill-appearing Cardiovascular: RRR Respiratory: no increased work of breathing noted, not in respiratory distress Neuro: Sleeping  Imaging: I personally reviewed recent imaging.   Assessment & Plan:   Assessment: Patient is a 53 year old male with a past medical history of diabetes mellitus type 2, VTE on Xarelto, stage II rectal adenocarcinoma status post neoadjuvant concurrent chemoradiation in 2022 followed by a low anterior resection with a complicated postoperative course including need for ostomy with chronic pelvic abscess who was admitted on 12/25/2023 under general surgery service for management of perirectal wound bleeding. During hospitalization, patient has required multiple transfusions for acute blood loss anemia secondary to perineal wound. 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 Patient also had complication with bladder fistula and is now requiring Foley. If foley not working appropraite, may need to consider  further urological intervention. Palliative medicine team consulted to assist with pain management.   Recommendations/Plan:  # Complex medical decision making/goals of care  - Have had discussions with patient, patient's wife, and patient's brother regarding medical care moving forward.  Patient continuing to hope for improvement and so continuing with full scope of medical interventions at this time. Last discussed 4/13.  Informed patient of information contained in ACP documentation should he want to complete at 1 point.  Could involve chaplain if needed.  Palliative medicine team will continue to follow along and engage in conversations as able and appropriate.     Code Status: Full Code  # Symptom management: Patient is receiving these palliative interventions for symptom management with an intent to improve quality of life.                 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                                 - Continue MS Contin to 30 mg every 8 hours during the day.                                   - Continue oxycodone 10-15 mg to every 3 hours as needed                               -  Continue IV hydromorphone to 2 mg every 2 hours as needed                               - Continue Tylenol to 1000 mg tab every 8 hours                  - Anxiety                               - Continue IV Ativan 0.5 mg every 6 hours as needed    - Continue Klonopin 0.25 mg twice daily.  May need increases in future. Brief PHQ-9 survey also done, will add low dose Escitalopram and monitor, patient used to be on Welbutrin in the past.   # Psycho-social/Spiritual Support:  - Support System: Wife  # Discharge Planning: To Be Determined  Discussed with: Patient  patient's wife  Thank you for allowing the palliative care team to participate in the care Steven [name].  Mod MDM Lujean Sake MD.   Palliative Care Provider PMT # [phone]  If patient remains symptomatic despite maximum doses, please call PMT at [phone] between 0700 and 1900. Outside of these hours, please call attending, as PMT does not have night coverage.

## 2024-01-13 DIAGNOSIS — R590 Localized enlarged lymph nodes: Secondary | ICD-10-CM | POA: Diagnosis not present

## 2024-01-13 DIAGNOSIS — Z452 Encounter for adjustment and management of vascular access device: Secondary | ICD-10-CM | POA: Diagnosis not present

## 2024-01-13 DIAGNOSIS — L598 Other specified disorders of the skin and subcutaneous tissue related to radiation: Secondary | ICD-10-CM | POA: Diagnosis not present

## 2024-01-13 DIAGNOSIS — K82A2 Perforation of gallbladder in cholecystitis: Secondary | ICD-10-CM | POA: Diagnosis not present

## 2024-01-13 DIAGNOSIS — F419 Anxiety disorder, unspecified: Secondary | ICD-10-CM | POA: Diagnosis not present

## 2024-01-13 DIAGNOSIS — R918 Other nonspecific abnormal finding of lung field: Secondary | ICD-10-CM | POA: Diagnosis not present

## 2024-01-13 DIAGNOSIS — N3289 Other specified disorders of bladder: Secondary | ICD-10-CM | POA: Diagnosis not present

## 2024-01-13 DIAGNOSIS — B372 Candidiasis of skin and nail: Secondary | ICD-10-CM | POA: Diagnosis not present

## 2024-01-13 DIAGNOSIS — D638 Anemia in other chronic diseases classified elsewhere: Secondary | ICD-10-CM | POA: Diagnosis not present

## 2024-01-13 DIAGNOSIS — E1065 Type 1 diabetes mellitus with hyperglycemia: Secondary | ICD-10-CM | POA: Diagnosis not present

## 2024-01-13 DIAGNOSIS — J986 Disorders of diaphragm: Secondary | ICD-10-CM | POA: Diagnosis not present

## 2024-01-13 DIAGNOSIS — B3789 Other sites of candidiasis: Secondary | ICD-10-CM | POA: Diagnosis not present

## 2024-01-13 DIAGNOSIS — C20 Malignant neoplasm of rectum: Secondary | ICD-10-CM | POA: Diagnosis not present

## 2024-01-13 DIAGNOSIS — K6811 Postprocedural retroperitoneal abscess: Secondary | ICD-10-CM | POA: Diagnosis not present

## 2024-01-13 DIAGNOSIS — N360 Urethral fistula: Secondary | ICD-10-CM | POA: Diagnosis not present

## 2024-01-13 DIAGNOSIS — G4709 Other insomnia: Secondary | ICD-10-CM | POA: Diagnosis not present

## 2024-01-13 DIAGNOSIS — Z95828 Presence of other vascular implants and grafts: Secondary | ICD-10-CM | POA: Diagnosis not present

## 2024-01-13 DIAGNOSIS — L98498 Non-pressure chronic ulcer of skin of other sites with other specified severity: Secondary | ICD-10-CM | POA: Diagnosis not present

## 2024-01-13 DIAGNOSIS — Z9889 Other specified postprocedural states: Secondary | ICD-10-CM | POA: Diagnosis not present

## 2024-01-13 DIAGNOSIS — E43 Unspecified severe protein-calorie malnutrition: Secondary | ICD-10-CM | POA: Diagnosis not present

## 2024-01-13 DIAGNOSIS — Z6824 Body mass index (BMI) 24.0-24.9, adult: Secondary | ICD-10-CM | POA: Diagnosis not present

## 2024-01-13 DIAGNOSIS — S31501A Unspecified open wound of unspecified external genital organs, male, initial encounter: Secondary | ICD-10-CM | POA: Diagnosis not present

## 2024-01-13 DIAGNOSIS — K819 Cholecystitis, unspecified: Secondary | ICD-10-CM | POA: Diagnosis not present

## 2024-01-13 DIAGNOSIS — D630 Anemia in neoplastic disease: Secondary | ICD-10-CM | POA: Diagnosis not present

## 2024-01-13 DIAGNOSIS — J9811 Atelectasis: Secondary | ICD-10-CM | POA: Diagnosis not present

## 2024-01-13 DIAGNOSIS — L98424 Non-pressure chronic ulcer of back with necrosis of bone: Secondary | ICD-10-CM | POA: Diagnosis not present

## 2024-01-13 DIAGNOSIS — K8001 Calculus of gallbladder with acute cholecystitis with obstruction: Secondary | ICD-10-CM | POA: Diagnosis not present

## 2024-01-13 DIAGNOSIS — N9989 Other postprocedural complications and disorders of genitourinary system: Secondary | ICD-10-CM | POA: Diagnosis not present

## 2024-01-13 DIAGNOSIS — N369 Urethral disorder, unspecified: Secondary | ICD-10-CM | POA: Diagnosis not present

## 2024-01-13 DIAGNOSIS — Y842 Radiological procedure and radiotherapy as the cause of abnormal reaction of the patient, or of later complication, without mention of misadventure at the time of the procedure: Secondary | ICD-10-CM | POA: Diagnosis not present

## 2024-01-13 DIAGNOSIS — K219 Gastro-esophageal reflux disease without esophagitis: Secondary | ICD-10-CM | POA: Diagnosis not present

## 2024-01-13 DIAGNOSIS — K810 Acute cholecystitis: Secondary | ICD-10-CM | POA: Diagnosis not present

## 2024-01-13 DIAGNOSIS — K838 Other specified diseases of biliary tract: Secondary | ICD-10-CM | POA: Diagnosis not present

## 2024-01-13 DIAGNOSIS — D649 Anemia, unspecified: Secondary | ICD-10-CM | POA: Diagnosis not present

## 2024-01-13 DIAGNOSIS — G8929 Other chronic pain: Secondary | ICD-10-CM | POA: Diagnosis not present

## 2024-01-13 DIAGNOSIS — G8918 Other acute postprocedural pain: Secondary | ICD-10-CM | POA: Diagnosis not present

## 2024-01-13 DIAGNOSIS — E1165 Type 2 diabetes mellitus with hyperglycemia: Secondary | ICD-10-CM | POA: Diagnosis not present

## 2024-01-13 DIAGNOSIS — Z789 Other specified health status: Secondary | ICD-10-CM | POA: Diagnosis not present

## 2024-01-13 DIAGNOSIS — R339 Retention of urine, unspecified: Secondary | ICD-10-CM | POA: Diagnosis not present

## 2024-01-13 DIAGNOSIS — G893 Neoplasm related pain (acute) (chronic): Secondary | ICD-10-CM | POA: Diagnosis not present

## 2024-01-13 DIAGNOSIS — L02211 Cutaneous abscess of abdominal wall: Secondary | ICD-10-CM | POA: Diagnosis not present

## 2024-01-13 DIAGNOSIS — L98491 Non-pressure chronic ulcer of skin of other sites limited to breakdown of skin: Secondary | ICD-10-CM | POA: Diagnosis not present

## 2024-01-13 DIAGNOSIS — F1190 Opioid use, unspecified, uncomplicated: Secondary | ICD-10-CM | POA: Diagnosis not present

## 2024-01-13 DIAGNOSIS — R739 Hyperglycemia, unspecified: Secondary | ICD-10-CM | POA: Diagnosis not present

## 2024-01-13 DIAGNOSIS — T85628A Displacement of other specified internal prosthetic devices, implants and grafts, initial encounter: Secondary | ICD-10-CM | POA: Diagnosis not present

## 2024-01-13 DIAGNOSIS — Z79891 Long term (current) use of opiate analgesic: Secondary | ICD-10-CM | POA: Diagnosis not present

## 2024-01-13 DIAGNOSIS — B379 Candidiasis, unspecified: Secondary | ICD-10-CM | POA: Diagnosis not present

## 2024-01-13 DIAGNOSIS — K632 Fistula of intestine: Secondary | ICD-10-CM | POA: Diagnosis not present

## 2024-01-13 DIAGNOSIS — E119 Type 2 diabetes mellitus without complications: Secondary | ICD-10-CM | POA: Diagnosis not present

## 2024-01-13 DIAGNOSIS — Z86718 Personal history of other venous thrombosis and embolism: Secondary | ICD-10-CM | POA: Diagnosis not present

## 2024-01-13 DIAGNOSIS — E559 Vitamin D deficiency, unspecified: Secondary | ICD-10-CM | POA: Diagnosis not present

## 2024-01-13 DIAGNOSIS — Z5309 Procedure and treatment not carried out because of other contraindication: Secondary | ICD-10-CM | POA: Diagnosis not present

## 2024-01-13 DIAGNOSIS — I96 Gangrene, not elsewhere classified: Secondary | ICD-10-CM | POA: Diagnosis not present

## 2024-01-13 DIAGNOSIS — I10 Essential (primary) hypertension: Secondary | ICD-10-CM | POA: Diagnosis not present

## 2024-01-13 DIAGNOSIS — K6289 Other specified diseases of anus and rectum: Secondary | ICD-10-CM | POA: Diagnosis not present

## 2024-01-13 DIAGNOSIS — R4589 Other symptoms and signs involving emotional state: Secondary | ICD-10-CM | POA: Diagnosis not present

## 2024-01-13 DIAGNOSIS — L98429 Non-pressure chronic ulcer of back with unspecified severity: Secondary | ICD-10-CM | POA: Diagnosis not present

## 2024-01-13 LAB — GLUCOSE, CAPILLARY
Glucose-Capillary: 101 mg/dL — ABNORMAL HIGH (ref 70–99)
Glucose-Capillary: 123 mg/dL — ABNORMAL HIGH (ref 70–99)
Glucose-Capillary: 124 mg/dL — ABNORMAL HIGH (ref 70–99)
Glucose-Capillary: 131 mg/dL — ABNORMAL HIGH (ref 70–99)
Glucose-Capillary: 167 mg/dL — ABNORMAL HIGH (ref 70–99)
Glucose-Capillary: 178 mg/dL — ABNORMAL HIGH (ref 70–99)

## 2024-01-13 LAB — CBC
HCT: 23.4 % — ABNORMAL LOW (ref 39.0–52.0)
Hemoglobin: 7.1 g/dL — ABNORMAL LOW (ref 13.0–17.0)
MCH: 26.1 pg (ref 26.0–34.0)
MCHC: 30.3 g/dL (ref 30.0–36.0)
MCV: 86 fL (ref 80.0–100.0)
Platelets: 412 10*3/uL — ABNORMAL HIGH (ref 150–400)
RBC: 2.72 MIL/uL — ABNORMAL LOW (ref 4.22–5.81)
RDW: 19.2 % — ABNORMAL HIGH (ref 11.5–15.5)
WBC: 6.2 10*3/uL (ref 4.0–10.5)
nRBC: 0 % (ref 0.0–0.2)

## 2024-01-13 LAB — COMPREHENSIVE METABOLIC PANEL WITH GFR
ALT: 10 U/L (ref 0–44)
AST: 15 U/L (ref 15–41)
Albumin: 1.5 g/dL — ABNORMAL LOW (ref 3.5–5.0)
Alkaline Phosphatase: 138 U/L — ABNORMAL HIGH (ref 38–126)
Anion gap: 6 (ref 5–15)
BUN: 31 mg/dL — ABNORMAL HIGH (ref 6–20)
CO2: 26 mmol/L (ref 22–32)
Calcium: 8.1 mg/dL — ABNORMAL LOW (ref 8.9–10.3)
Chloride: 104 mmol/L (ref 98–111)
Creatinine, Ser: 0.56 mg/dL — ABNORMAL LOW (ref 0.61–1.24)
GFR, Estimated: >60 mL/min (ref >60)
Glucose, Bld: 143 mg/dL — ABNORMAL HIGH (ref 70–99)
Potassium: 4 mmol/L (ref 3.5–5.1)
Sodium: 136 mmol/L (ref 135–145)
Total Bilirubin: 0.5 mg/dL (ref 0.0–1.2)
Total Protein: 5.6 g/dL — ABNORMAL LOW (ref 6.5–8.1)

## 2024-01-13 LAB — PHOSPHORUS: Phosphorus: 2.8 mg/dL (ref 2.5–4.6)

## 2024-01-13 LAB — MAGNESIUM: Magnesium: 1.8 mg/dL (ref 1.7–2.4)

## 2024-01-13 MED ORDER — ENOXAPARIN SODIUM 30 MG/0.3ML IJ SOSY: 30.0000 mg | PREFILLED_SYRINGE | Freq: Two times a day (BID) | INTRAMUSCULAR | Status: DC

## 2024-01-13 MED ORDER — ZINC SULFATE 220 (50 ZN) MG PO CAPS: 220.0000 mg | ORAL_CAPSULE | Freq: Every day | ORAL | Status: DC

## 2024-01-13 MED ORDER — MORPHINE SULFATE ER 30 MG PO TBCR: 30.0000 mg | EXTENDED_RELEASE_TABLET | Freq: Three times a day (TID) | ORAL | Status: DC

## 2024-01-13 MED ORDER — ASCORBIC ACID 500 MG PO TABS: 500.0000 mg | ORAL_TABLET | Freq: Two times a day (BID) | ORAL | Status: DC

## 2024-01-13 MED ORDER — FERROUS SULFATE 325 (65 FE) MG PO TABS: 325.0000 mg | ORAL_TABLET | Freq: Two times a day (BID) | ORAL | Status: AC

## 2024-01-13 MED ORDER — NYSTATIN 100000 UNIT/GM EX POWD: Freq: Two times a day (BID) | CUTANEOUS | Status: DC

## 2024-01-13 MED ORDER — GLUCERNA SHAKE PO LIQD: 237.0000 mL | Freq: Two times a day (BID) | ORAL | Status: DC

## 2024-01-13 MED ORDER — DIPHENHYDRAMINE HCL 25 MG PO CAPS: 25.0000 mg | ORAL_CAPSULE | Freq: Four times a day (QID) | ORAL | Status: AC | PRN

## 2024-01-13 MED ORDER — INSULIN ASPART 100 UNIT/ML IJ SOLN
0.0000 [IU] | INTRAMUSCULAR | Status: DC
Start: 2024-01-13 — End: 2024-02-11

## 2024-01-13 MED ORDER — TRAVASOL 10 % IV SOLN
INTRAVENOUS | Status: DC
  Filled 2024-01-13: qty 1231.2

## 2024-01-13 MED ORDER — HYDROMORPHONE HCL 1 MG/ML IJ SOLN
1.0000 mg | INTRAMUSCULAR | Status: DC | PRN
Start: 2024-01-13 — End: 2024-02-11

## 2024-01-13 MED ORDER — LORAZEPAM 2 MG/ML IJ SOLN: 0.5000 mg | Freq: Four times a day (QID) | INTRAMUSCULAR | Status: DC | PRN

## 2024-01-13 MED ORDER — MAGIC MOUTHWASH W/LIDOCAINE: 10.0000 mL | Freq: Three times a day (TID) | ORAL | Status: DC

## 2024-01-13 MED ORDER — METHOCARBAMOL 1000 MG/10ML IJ SOLN: 500.0000 mg | Freq: Four times a day (QID) | INTRAMUSCULAR | Status: DC

## 2024-01-13 MED ORDER — PIPERACILLIN-TAZOBACTAM 3.375 G IVPB: 3.3750 g | Freq: Three times a day (TID) | INTRAVENOUS | Status: DC

## 2024-01-13 MED ORDER — HYDROMORPHONE HCL 1 MG/ML IJ SOLN: 0.5000 mg | Freq: Every day | INTRAMUSCULAR | Status: DC | PRN

## 2024-01-13 MED ORDER — ESCITALOPRAM OXALATE 5 MG PO TABS: 5.0000 mg | ORAL_TABLET | Freq: Every day | ORAL | Status: DC

## 2024-01-13 MED ORDER — NAPHAZOLINE-GLYCERIN 0.012-0.25 % OP SOLN: 1.0000 [drp] | Freq: Four times a day (QID) | OPHTHALMIC | Status: DC | PRN

## 2024-01-13 MED ORDER — CLONAZEPAM 0.25 MG PO TBDP: 0.2500 mg | ORAL_TABLET | Freq: Two times a day (BID) | ORAL | Status: DC

## 2024-01-13 MED ORDER — ONDANSETRON 4 MG PO TBDP: 4.0000 mg | ORAL_TABLET | Freq: Four times a day (QID) | ORAL | Status: DC | PRN

## 2024-01-13 MED ORDER — CLONAZEPAM 0.125 MG PO TBDP: 0.2500 mg | ORAL_TABLET | Freq: Two times a day (BID) | ORAL | Status: DC

## 2024-01-13 MED ORDER — CALCIUM POLYCARBOPHIL 625 MG PO TABS: 625.0000 mg | ORAL_TABLET | Freq: Two times a day (BID) | ORAL | Status: DC

## 2024-01-13 MED ORDER — OXYCODONE HCL 10 MG PO TABS
10.0000 mg | ORAL_TABLET | ORAL | Status: DC | PRN
Start: 2024-01-13 — End: 2024-02-11

## 2024-01-13 MED ORDER — DICLOFENAC SODIUM 1 % EX GEL: 2.0000 g | Freq: Four times a day (QID) | CUTANEOUS | Status: DC | PRN

## 2024-01-13 NOTE — Progress Notes (Signed)
 Patient was picked by [facility]. VSS  and IV Dilaudid was given before transfer.

## 2024-01-13 NOTE — Progress Notes (Signed)
 PHARMACY - TOTAL PARENTERAL NUTRITION CONSULT NOTE   Indication: EC Fistula   Patient Measurements: Height: 5\' 10"  (177.8 cm) Weight: 83 kg (182 lb 15.7 oz) IBW/kg (Calculated) : 73 TPN AdjBW (KG): 83 Body mass index is 26.26 kg/m.  Assessment:  29 yoM w/ hx DM2, PE on Xarelto, stage II rectal cancer s/p chemoradiation. Underwent low anterior & abdominoperineal resections with ileostomy in 2022, redo LAR in 2024, ultimately end colostomy placement March 2025; admitted 3/28 for sacrococcygeal ulceration with necrosis & bleeding. Prior to admission, patient did not have issues tolerating solid foods and then as inpatient was able to take some solid food while on soft diet. Notable weight loss of 7% over 1 month. Pharmacy consulted to manage TPN for nutrition support on 4/2 (admission day 6) for EC fistula. While fistula output is not high, has chronic pelvic abscess with rectal stump disruption and ongoing leaking. 4/10 in OR abscess interrogated and drained into ostomy, underwent debridement of perineal wound.   Glucose / Insulin: T2DM, A1c 7.5 -CBGs trending down, suspect spike 4/15 related to steroid given operatively -50u insulin in TPN, sensitive SSI q4h Electrolytes: WNL Renal: Scr < 1, BUN elevated Hepatic: AST/ALT WNL, AlkPhos 138, albumin 1.5 I/O:  -UOP 2900 ml yesterday, 1150 ml recorded this am -Drain ~ 150 ml daily -LBM 4/16  GI Imaging:  4/1 CT A/P: combination of oral contrast and air between bowel and sacrococcygeal wound suggesting combination of perforation/fistulization and necrotic infection GI Surgeries / Procedures:  3/28 EUA for bleeding from perineal wound 4/2: debridement and wound vac placement 4/4  wound exploration, VAC dressing change 4/7 repeat dressing change 4/10 I&D of perineal wound, abdominal wall abscess  Central access: PICC 4/2 TPN start date: 4/2  Nutritional Goals: Initial goal rate 85 mL/hr (provides 108 g of protein and 2085 kcals per  day) 4/8 Updated goal rate 90 mL/h provides 123 g protein and 2132 kcal   RD Assessment: 4/8 Estimated Needs Total Energy Estimated Needs: 2100-2350 kcals Total Protein Estimated Needs: 120-135 grams Total Fluid Estimated Needs: >/= 2.1L   Current Nutrition:  TPN + FLD Glucerna TID  Plan:  -Continue TPN at goal rate of 90 mL/hr; -Electrolytes in TPN: Na 150 mEq/L, K 45 mEq/L, Ca 5 mEq/L, Mg to 8 mEq/L, Phos 15 mmol/L, Cl:Ac ratio 1:1 -Add standard MVI and trace elements to TPN -With downward trend in CBGs over the course of 4/15-4/16, will decrease insulin in TPN to 30 units  -Continue sensitive SSI q4h -Monitor TPN labs on Mon/Thurs, PRN  Lolita Rise, PharmD, BCPS Clinical Pharmacist 01/13/2024 11:10 AM

## 2024-01-13 NOTE — Discharge Summary (Signed)
 Physician Discharge Summary  Patient ID: Steven [name] MRN: [id number] DOB/AGE: 53-May-1972 53 y.o.  Admit date: 12/25/2023 Discharge date: 01/13/2024  Admission Diagnoses: Pelvic Abscess SBO Discharge Diagnoses:  Principal Problem:   Anemia due to acute blood loss Active Problems:   Post-operative hemoglobin drop   High risk medication use   Need for emotional support   Counseling and coordination of care   Medication management   Palliative care encounter   Disruption of perineal wound in male   Pain   Fistula   History of rectal cancer   Goals of care, counseling/discussion   Discharged Condition: stable  Hospital Course: Pt with h/o DVT/PE, rectal cancer and DM who is s/p ChemoRT and LAR with loop ileostomy in Oct 2022.  This was complicated by anastomotic leak that failed to heal.  This lead to conversion of LAR to end colostomy with closure of loop ileostomy and oversew of anal canal.  He then developed a post op hematoma that became infected and would not heal with pelvic drain.  Eventually his anal canal opened and he began to chronically drain from this.  He then presented to the ED with worsening pelvic abscess and SBO in March 2025.  It was decided to perform a completion proctectomy and remove his anal canal to facilitate drainage and allow area to heal enough for flap placement.  Since that time he has continued to develop necrosis of the perineal tissues and pelvic structures, requiring frequent debridements.  In early April he developed signs of sepsis and was noted to have necrosis spreading to gluteus muscles.  There was also concern for small bowel fistula on CT.  He was made NPO and placed on TPN.  We switched to wound vac placements with changes in OR twice a weak.  His small bowel appeared to heal after a couple weeks and his diet was advanced to liquids but left on TPN.  On one of his last vac changes, his foley was noted to exposed.  Urology was called and the area  was oversewn with plans to leave foley indefinitely.  At this point, it was decided that the patient would need a higher level of care than what could be provided.  In discussions with in house plastic surgeons, they felt that this was out of their scope of practice and recommended transfer to tertiary facility.      Consults: urology and Palliative Care  Significant Diagnostic Studies: labs: CBC, CMET and radiology: CT scan: see epic report  Treatments: IV hydration, analgesia: acetaminophen and Dilaudid, insulin: regular, TPN, procedures: PICC line, and surgery: see op notes in Epic  Discharge Exam: Blood pressure 126/73, pulse (!) 102, temperature 99 F (37.2 C), temperature source Oral, resp. rate 18, height 5\' 10"  (1.778 m), weight 83 kg, SpO2 100%. General appearance: alert and cooperative Cardio: regular rate and rhythm GI: soft, ostomy in place, wound vac in place  Disposition: Discharge disposition: [facility] Not Defined        Allergies as of 01/13/2024       Reactions   Contrast Media [iodinated Contrast Media] Hives, Nausea Only   As a 53 year old experienced hives and nausea        Medication List     STOP taking these medications    ALPRAZolam 0.5 MG tablet Commonly known as: XANAX       TAKE these medications    acetaminophen 500 MG tablet Commonly known as: TYLENOL Take 1,000 mg  by mouth every 6 (six) hours as needed for mild pain.   ascorbic acid 500 MG tablet Commonly known as: VITAMIN C Take 1 tablet (500 mg total) by mouth 2 (two) times daily.   calcium carbonate 500 MG chewable tablet Commonly known as: TUMS - dosed in mg elemental calcium Chew 2 tablets by mouth daily as needed for indigestion or heartburn.   clonazePAM 0.25 MG disintegrating tablet Commonly known as: KLONOPIN Take 1 tablet (0.25 mg total) by mouth 2 (two) times daily.   diclofenac Sodium 1 % Gel Commonly known as: VOLTAREN Apply 2 g  topically every 6 (six) hours as needed (pain in leg).   diphenhydrAMINE 25 mg capsule Commonly known as: BENADRYL Take 1 capsule (25 mg total) by mouth every 6 (six) hours as needed for itching.   enoxaparin 30 MG/0.3ML injection Commonly known as: LOVENOX Inject 0.3 mLs (30 mg total) into the skin every 12 (twelve) hours.   escitalopram 5 MG tablet Commonly known as: LEXAPRO Take 1 tablet (5 mg total) by mouth at bedtime.   Farxiga 10 MG Tabs tablet Generic drug: dapagliflozin propanediol Take 10 mg by mouth daily.   feeding supplement (GLUCERNA SHAKE) Liqd Take 237 mLs by mouth 2 (two) times daily between meals. Start taking on: January 14, 2024   fenofibrate 160 MG tablet Take 160 mg by mouth daily.   ferrous sulfate 325 (65 FE) MG tablet Take 1 tablet (325 mg total) by mouth 2 (two) times daily with a meal. Start taking on: January 14, 2024   GAS-X PO Take 1-2 tablets by mouth as needed (gas).   HYDROcodone-acetaminophen 5-325 MG tablet Commonly known as: NORCO/VICODIN Take 1-2 tablets by mouth every 6 (six) hours as needed for moderate pain (pain score 4-6). What changed:  how much to take when to take this   HYDROmorphone 1 MG/ML injection Commonly known as: DILAUDID Inject 0.5 mLs (0.5 mg total) into the vein daily as needed (Severe uncontrolled breakthrough pain once daily after patient care or wound care or any such necessity if the patient is not eligible for his other as needed dose of IV Dilaudid.).   HYDROmorphone 1 MG/ML injection Commonly known as: DILAUDID Inject 1-2 mLs (1-2 mg total) into the vein every 2 (two) hours as needed (breakthrough and related to wound care).   ibuprofen 200 MG tablet Commonly known as: ADVIL Take 600 mg by mouth every 6 (six) hours as needed for moderate pain.   insulin aspart 100 UNIT/ML injection Commonly known as: novoLOG Inject 0-9 Units into the skin every 4 (four) hours.   Kerlix Gauze Roll Large Misc 1 Units by  Does not apply route daily.   Combine ABD 5"X9" Pads 1 Units by Does not apply route daily.   LORazepam 2 MG/ML injection Commonly known as: ATIVAN Inject 0.25 mLs (0.5 mg total) into the vein every 6 (six) hours as needed for anxiety (anxiety related to wound care).   magic mouthwash w/lidocaine Soln Take 10 mLs by mouth 3 (three) times daily. Suspension contains equal amounts of Maalox Extra Strength, nystatin, diphenhydramine and lidocaine.   metFORMIN 1000 MG tablet Commonly known as: GLUCOPHAGE 1 tablet with a meal Orally Twice a day for diabetes for 30 days   methocarbamol 500 MG tablet Commonly known as: ROBAXIN Take 2 tablets (1,000 mg total) by mouth every 8 (eight) hours as needed for muscle spasms. What changed:  how much to take when to take this   methocarbamol 1000 MG/10ML  injection Commonly known as: ROBAXIN Inject 5 mLs (500 mg total) into the vein every 6 (six) hours. Start taking on: January 14, 2024 What changed: You were already taking a medication with the same name, and this prescription was added. Make sure you understand how and when to take each.   morphine 30 MG 12 hr tablet Commonly known as: MS CONTIN Take 1 tablet (30 mg total) by mouth every 8 (eight) hours.   naphazoline-glycerin 0.012-0.25 % Soln Commonly known as: CLEAR EYES REDNESS Place 1-2 drops into both eyes 4 (four) times daily as needed for eye irritation.   naproxen sodium 220 MG tablet Commonly known as: ALEVE Take 440 mg by mouth daily as needed (pain).   nystatin powder Commonly known as: MYCOSTATIN/NYSTOP Apply topically 2 (two) times daily.   ondansetron 4 MG disintegrating tablet Commonly known as: ZOFRAN-ODT Take 1 tablet (4 mg total) by mouth every 6 (six) hours as needed for nausea.   Oxycodone HCl 10 MG Tabs Take 1-1.5 tablets (10-15 mg total) by mouth every 3 (three) hours as needed for moderate pain (pain score 4-6) or severe pain (pain score 7-10) (5 mg for 4-6/10  pain. 10 mg for >6/10 pain).   piperacillin-tazobactam 3.375 GM/50ML IVPB Commonly known as: ZOSYN Inject 50 mLs (3.375 g total) into the vein every 8 (eight) hours.   polycarbophil 625 MG tablet Commonly known as: FIBERCON Take 1 tablet (625 mg total) by mouth 2 (two) times daily.   ProAir HFA 108 (90 Base) MCG/ACT inhaler Generic drug: albuterol Inhale 2 puffs into the lungs every 4 (four) hours as needed for wheezing or shortness of breath.   rivaroxaban 20 MG Tabs tablet Commonly known as: XARELTO Take 1 tablet (20 mg total) by mouth daily with supper.   tetrahydrozoline-zinc 0.05-0.25 % ophthalmic solution Commonly known as: VISINE-AC Place 2 drops into both eyes daily as needed (allergy).   traMADol 50 MG tablet Commonly known as: ULTRAM Take 100 mg by mouth daily as needed for moderate pain (pain score 4-6) or severe pain (pain score 7-10).   Xigduo XR 06-999 MG Tb24 Generic drug: Dapagliflozin Pro-metFORMIN ER Take 1 tablet by mouth daily.   zinc sulfate (50mg  elemental zinc) 220 (50 Zn) MG capsule Take 1 capsule (220 mg total) by mouth daily. Start taking on: January 14, 2024         Signed: Fernande Howells 01/13/2024, 7:12 PM

## 2024-01-13 NOTE — Progress Notes (Signed)
 2 Days Post-Op   Subjective/Chief Complaint: Overall good pain control today. Consistently sipping on liquids   Objective: Vital signs in last 24 hours: Temp:  [97.7 F (36.5 C)-98.8 F (37.1 C)] 98.6 F (37 C) (04/16 1110) Pulse Rate:  [82-101] 99 (04/16 1157) Resp:  [11-22] 11 (04/16 1157) BP: (101-144)/(51-72) 101/51 (04/16 1000) SpO2:  [91 %-99 %] 99 % (04/16 1157) Last BM Date : 01/13/24  Intake/Output from previous day: 04/15 0701 - 04/16 0700 In: 2505.6 [I.V.:2358.3; IV Piggyback:147.3] Out: 2525 [Urine:2225; Drains:300] Intake/Output this shift: Total I/O In: 596.6 [I.V.:546.6; IV Piggyback:50] Out: 800 [Urine:800]  General appearance: alert, cooperative, and no distress Resp: breathing comfortably Cardio: regular rate and rhythm GI: soft, non distended, ostomy with gas and small amount of liquid stool.    Lab Results:  Recent Labs    01/11/24 0454 01/13/24 0430  WBC 7.1 6.2  HGB 7.6* 7.1*  HCT 24.2* 23.4*  PLT 459* 412*   BMET Recent Labs    01/12/24 0445 01/13/24 0430  NA 136 136  K 4.3 4.0  CL 104 104  CO2 27 26  GLUCOSE 276* 143*  BUN 27* 31*  CREATININE 0.59* 0.56*  CALCIUM 8.3* 8.1*   PT/INR No results for input(s): "LABPROT", "INR" in the last 72 hours. ABG No results for input(s): "PHART", "HCO3" in the last 72 hours.  Invalid input(s): "PCO2", "PO2"  Studies/Results: No results found.  Anti-infectives: Anti-infectives (From admission, onward)    Start     Dose/Rate Route Frequency Ordered Stop   01/11/24 1356  piperacillin-tazobactam (ZOSYN) 3.375 GM/50ML IVPB       Note to Pharmacy: Raymon Caldron C: cabinet override      01/11/24 1356 01/11/24 1436   01/04/24 1400  piperacillin-tazobactam (ZOSYN) IVPB 3.375 g       Note to Pharmacy: Pharmacy to check dosing >> approved by [facility]   3.375 g 12.5 mL/hr over 240 Minutes Intravenous Every 8 hours 01/04/24 1257     12/25/23 1800  piperacillin-tazobactam (ZOSYN) IVPB 3.375 g   Status:  Discontinued       Note to Pharmacy: Pharmacy to check dosing >> approved by [facility]   3.375 g 12.5 mL/hr over 240 Minutes Intravenous Every 8 hours 12/25/23 1713 01/04/24 1257       Assessment/Plan:  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 Intra-op urology c/s Dr. Valeta Gaudier. S/p closure of bladder cystotomy/fistula 4/10 Vac change yesterday wit no small bowel succus. Plan next vac change Thur Continue foley. UOP 1600 ml/24. Per urology cont foley as long as it is draining well. If stops draining or fistula reopens consider b/l perc neph Monitor abdominal wall abscess near stoma Hgb stable. Monitor bp and recheck am Continue IV Zosyn (3/28>>) Okay for FLD. TNA, PICC line Nystatin for rash lower abdomen/groin.  Palliative consulted to assist with pain and anxiety and adjustments made - appreciate their help   FEN: FLD, TPN ID: zosyn 3/28> VTE: SCDs - xarelto held in setting of anemia   DM with hyperglycemia - on insulin ABL anemia with anemia of chronic disease History of VTE on xarelto at baseline. Hold full anticoag, on SQ Lovenox  Xfer to floor   LOS: 19 days    Fernande Howells, MD [facility] Surgery 01/13/2024, 12:10 PM Please see Amion  for pager number during day hours 7:00am-4:30pm

## 2024-01-13 NOTE — Progress Notes (Signed)
 Daily Progress Note   Patient Name: Steven [name]       Date: 01/13/2024 DOB: September 22, 1971  Age: 53 y.o. MRN#: [id number] Attending Physician: Bishop Limbo, MD Primary Care Physician: Noberto Retort, MD Admit Date: 12/25/2023 Length of Stay: 19 days  Reason for Consultation/Follow-up: Pain control  Subjective:   CC: Patient awake alert resting in bed nursing colleague present at bedside following up regarding pain control.  Subjective:  Reviewed EMR prior to presenting to bedside. Patient states that he rested well overall last night, had 1 episode of severe uncontrolled pain earlier this morning.  See below.   we discussed about his current pain and non pain regimen, we talked about symptom management.   Objective:   Vital Signs:  BP (!) 144/72   Pulse 97   Temp 98.8 F (37.1 C) (Oral)   Resp 14   Ht 5\' 10"  (1.778 m)   Wt 83 kg   SpO2 97%   BMI 26.26 kg/m   Physical Exam: General: awake alert, tearful, flat affect, chronically ill-appearing Cardiovascular: RRR Respiratory: no increased work of breathing noted, not in respiratory distress Neuro: Sleeping  Imaging: I personally reviewed recent imaging.   Assessment & Plan:   Assessment: Patient is a 53 year old male with a past medical history of diabetes mellitus type 2, VTE on Xarelto, stage II rectal adenocarcinoma status post neoadjuvant concurrent chemoradiation in 2022 followed by a low anterior resection with a complicated postoperative course including need for ostomy with chronic pelvic abscess who was admitted on 12/25/2023 under general surgery service for management of perirectal wound bleeding. During hospitalization, patient has required multiple transfusions for acute blood loss anemia secondary to perineal wound. 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 Patient also had complication with bladder  fistula and is now requiring Foley. If foley not working appropraite, may need to consider further urological intervention. Palliative medicine team consulted to assist with pain management.   Recommendations/Plan:  # Complex medical decision making/goals of care  - Have had discussions with patient, patient's wife, and patient's brother regarding medical care moving forward.  Patient continuing to hope for improvement and so continuing with full scope of medical interventions at this time. Last discussed 4/13.  Informed patient of information contained in ACP documentation should he want to complete at 1 point.  Could involve chaplain if needed.  Palliative medicine team will continue to follow along and engage in conversations as able and appropriate.     Code Status: Full Code  # Symptom management: Patient is receiving these palliative interventions for symptom management with an intent to improve quality of life.                 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                                 - Continue MS Contin to 30 mg every 8 hours during the day.                                   - Continue oxycodone 10-15 mg to every 3 hours as needed                               -  Continue IV hydromorphone to 2 mg every 2 hours as needed, add 0.5 mg IV Dilaudid to be used once every 24 hours for severe breakthrough pain for the necessity of patient care/wound care if he is not due for his every 2 as needed.                               - Continue Tylenol to 1000 mg tab every 8 hours                  - Anxiety                               - Continue IV Ativan 0.5 mg every 6 hours as needed    - Continue Klonopin 0.25 mg twice daily.  May need increases in future.  Continue to monitor. Continue 5 mg of escitalopram started on 4 - 15 - 25.  # Psycho-social/Spiritual Support:  - Support  System: Wife  # Discharge Planning: To Be Determined  Discussed with: Patient  and RN  Thank you for allowing the palliative care team to participate in the care Grayland Le.  Mod MDM Lujean Sake MD.  Palliative Care Provider PMT # [phone]  If patient remains symptomatic despite maximum doses, please call PMT at [phone] between 0700 and 1900. Outside of these hours, please call attending, as PMT does not have night coverage.

## 2024-01-13 NOTE — Progress Notes (Signed)
 Nutrition Follow-up  DOCUMENTATION CODES:      INTERVENTION:  - Continue TPN.              - TPN management per pharmacy.    - Diet per [name]. Currently on Full Liquids. - Glucerna Shake po BID, each supplement provides 220 kcal and 10 grams of protein - Ensure Max po once daily, provides 150 kcal and 30 grams of protein.   - Continue 500mg  vitamin C BID and 220mg  zinc x14 days to support wound healing. Patient also receiving MVI in TPN.   - Monitor weight trends.   NUTRITION DIAGNOSIS:   Increased nutrient needs related to wound healing as evidenced by estimated needs. *ongoing  GOAL:   Patient will meet greater than or equal to 90% of their needs *met with TPN  MONITOR:    (TPN)  REASON FOR ASSESSMENT:   Consult New TPN/TNA  ASSESSMENT:   53 year old male with history of diabetes type 2, VTE on Xarelto, stage II rectal adenocarcinoma status post neoadjuvant concurrent chemoradiation in 2022 followed by low anterior resection complicated postoperative course with a chronic pelvic abscess was admitted under general surgery service after he presented with perirectal wound bleeding.  3/28: admitted, NPO->CLD->NPO 3/29: FLD 3/30: Soft diet 3/31: Heart Healthy/CHO modified 4/1: NPO->CLD 4/2: OR for I&D of sacral wound with wound vac placement; NPO; TPN initiated 4/4: OR for wound exploration and VAC dressing change; CLD 4/7: OR for I&D or wound (20x15 cm), wound vac replacement 4/10: OR for wound vac change, found to have bladder cystotomy with fistula s/p closure  4/14: OR for I&D, wound vac change; advanced to FLD  Patient reports being sleepy at time of visit. He endorses eating a little at each meal. Noted to be ordering small meals and occasionally skipping meals.  After diet advanced to full liquids 4/14, added Glucerna and Ensure Max. Patient endorses enjoying them and drinking them, as confirmed via [facility] documentation.  Encouraged patient to continue consuming  supplements as able.   Patient remains on full strength TPN, meeting 100% of needs.      Admit weight: 188# Current weight: 182# I&O's: -4.9L since 4/2 + for deep pitting RLE/LLE edema Per weight records, pt lost 15 lbs from 3/5 to 3/30 (8% wt loss x <1 month, significant for time frame).    Medications reviewed and include: 500mg  vitamin C BID, 325mg  ferrous sulfate, 220mg  zinc, Fibercon BID   Labs reviewed:  HA1C 7.5 Blood Glucose 124-233 x24 hours     Diet Order:   Diet Order             Diet full liquid Room service appropriate? Yes; Fluid consistency: Thin  Diet effective now                   EDUCATION NEEDS:  No education needs have been identified at this time  Skin:  Skin Assessment: Skin Integrity Issues: Skin Integrity Issues:: Incisions Incisions: 3/28 rectum  Last BM:  4/16 - colostomy  Height:  Ht Readings from Last 1 Encounters:  01/11/24 5\' 10"  (1.778 m)   Weight:  Wt Readings from Last 1 Encounters:  01/12/24 83 kg    BMI:  Body mass index is 26.26 kg/m.  Estimated Nutritional Needs:  Kcal:  2100-2350 kcals Protein:  120-135 grams Fluid:  >/= 2.1L    Shelle Iron RD, LDN Contact via Secure Chat.

## 2024-01-14 ENCOUNTER — Inpatient Hospital Stay: Admit: 2024-01-14 | Admitting: General Surgery

## 2024-01-14 DIAGNOSIS — F1190 Opioid use, unspecified, uncomplicated: Secondary | ICD-10-CM | POA: Diagnosis not present

## 2024-01-14 DIAGNOSIS — G8918 Other acute postprocedural pain: Secondary | ICD-10-CM | POA: Diagnosis not present

## 2024-01-14 DIAGNOSIS — G893 Neoplasm related pain (acute) (chronic): Secondary | ICD-10-CM | POA: Diagnosis not present

## 2024-01-14 DIAGNOSIS — R590 Localized enlarged lymph nodes: Secondary | ICD-10-CM | POA: Diagnosis not present

## 2024-01-14 DIAGNOSIS — C20 Malignant neoplasm of rectum: Secondary | ICD-10-CM | POA: Diagnosis not present

## 2024-01-14 DIAGNOSIS — N3289 Other specified disorders of bladder: Secondary | ICD-10-CM | POA: Diagnosis not present

## 2024-01-14 SURGERY — IRRIGATION AND DEBRIDEMENT WOUND
Anesthesia: General

## 2024-01-15 DIAGNOSIS — K838 Other specified diseases of biliary tract: Secondary | ICD-10-CM | POA: Diagnosis not present

## 2024-01-15 DIAGNOSIS — N369 Urethral disorder, unspecified: Secondary | ICD-10-CM | POA: Diagnosis not present

## 2024-01-15 DIAGNOSIS — Z86718 Personal history of other venous thrombosis and embolism: Secondary | ICD-10-CM | POA: Diagnosis not present

## 2024-01-15 DIAGNOSIS — R339 Retention of urine, unspecified: Secondary | ICD-10-CM | POA: Diagnosis not present

## 2024-01-15 DIAGNOSIS — E119 Type 2 diabetes mellitus without complications: Secondary | ICD-10-CM | POA: Diagnosis not present

## 2024-01-15 DIAGNOSIS — D649 Anemia, unspecified: Secondary | ICD-10-CM | POA: Diagnosis not present

## 2024-01-15 DIAGNOSIS — C20 Malignant neoplasm of rectum: Secondary | ICD-10-CM | POA: Diagnosis not present

## 2024-01-16 DIAGNOSIS — N369 Urethral disorder, unspecified: Secondary | ICD-10-CM | POA: Diagnosis not present

## 2024-01-16 DIAGNOSIS — D630 Anemia in neoplastic disease: Secondary | ICD-10-CM | POA: Diagnosis not present

## 2024-01-16 DIAGNOSIS — R339 Retention of urine, unspecified: Secondary | ICD-10-CM | POA: Diagnosis not present

## 2024-01-16 DIAGNOSIS — E119 Type 2 diabetes mellitus without complications: Secondary | ICD-10-CM | POA: Diagnosis not present

## 2024-01-16 DIAGNOSIS — F419 Anxiety disorder, unspecified: Secondary | ICD-10-CM | POA: Diagnosis not present

## 2024-01-17 ENCOUNTER — Encounter: Payer: Self-pay | Admitting: Hematology and Oncology

## 2024-01-17 ENCOUNTER — Encounter: Payer: Self-pay | Admitting: Gastroenterology

## 2024-01-17 DIAGNOSIS — E119 Type 2 diabetes mellitus without complications: Secondary | ICD-10-CM | POA: Diagnosis not present

## 2024-01-17 DIAGNOSIS — D630 Anemia in neoplastic disease: Secondary | ICD-10-CM | POA: Diagnosis not present

## 2024-01-17 DIAGNOSIS — F419 Anxiety disorder, unspecified: Secondary | ICD-10-CM | POA: Diagnosis not present

## 2024-01-18 ENCOUNTER — Other Ambulatory Visit (HOSPITAL_COMMUNITY): Payer: Self-pay

## 2024-01-18 DIAGNOSIS — K819 Cholecystitis, unspecified: Secondary | ICD-10-CM | POA: Diagnosis not present

## 2024-01-18 DIAGNOSIS — K6289 Other specified diseases of anus and rectum: Secondary | ICD-10-CM | POA: Diagnosis not present

## 2024-01-18 DIAGNOSIS — C20 Malignant neoplasm of rectum: Secondary | ICD-10-CM | POA: Diagnosis not present

## 2024-01-18 DIAGNOSIS — R739 Hyperglycemia, unspecified: Secondary | ICD-10-CM | POA: Diagnosis not present

## 2024-01-18 DIAGNOSIS — G893 Neoplasm related pain (acute) (chronic): Secondary | ICD-10-CM | POA: Diagnosis not present

## 2024-01-18 NOTE — Telephone Encounter (Signed)
FYI:  Dr Mansouraty  

## 2024-01-19 DIAGNOSIS — K819 Cholecystitis, unspecified: Secondary | ICD-10-CM | POA: Diagnosis not present

## 2024-01-19 DIAGNOSIS — K810 Acute cholecystitis: Secondary | ICD-10-CM | POA: Diagnosis not present

## 2024-01-19 DIAGNOSIS — K632 Fistula of intestine: Secondary | ICD-10-CM | POA: Diagnosis not present

## 2024-01-19 DIAGNOSIS — Z789 Other specified health status: Secondary | ICD-10-CM | POA: Diagnosis not present

## 2024-01-19 DIAGNOSIS — R4589 Other symptoms and signs involving emotional state: Secondary | ICD-10-CM | POA: Diagnosis not present

## 2024-01-19 DIAGNOSIS — E43 Unspecified severe protein-calorie malnutrition: Secondary | ICD-10-CM | POA: Diagnosis not present

## 2024-01-19 DIAGNOSIS — K6289 Other specified diseases of anus and rectum: Secondary | ICD-10-CM | POA: Diagnosis not present

## 2024-01-19 DIAGNOSIS — R739 Hyperglycemia, unspecified: Secondary | ICD-10-CM | POA: Diagnosis not present

## 2024-01-19 DIAGNOSIS — E1065 Type 1 diabetes mellitus with hyperglycemia: Secondary | ICD-10-CM | POA: Diagnosis not present

## 2024-01-19 DIAGNOSIS — C20 Malignant neoplasm of rectum: Secondary | ICD-10-CM | POA: Diagnosis not present

## 2024-01-19 NOTE — Telephone Encounter (Signed)
 Patty, Thank you for letting me know. Looks like he is doing quite poorly unfortunately. It is not clear we will ever get to do another colonoscopy or should do a colonoscopy on him. I put a recall in the system for about 3 months for a clinic visit with myself or APP. Thanks. GM

## 2024-01-20 DIAGNOSIS — G893 Neoplasm related pain (acute) (chronic): Secondary | ICD-10-CM | POA: Diagnosis not present

## 2024-01-20 DIAGNOSIS — K819 Cholecystitis, unspecified: Secondary | ICD-10-CM | POA: Diagnosis not present

## 2024-01-20 DIAGNOSIS — K6289 Other specified diseases of anus and rectum: Secondary | ICD-10-CM | POA: Diagnosis not present

## 2024-01-20 DIAGNOSIS — E1065 Type 1 diabetes mellitus with hyperglycemia: Secondary | ICD-10-CM | POA: Diagnosis not present

## 2024-01-20 DIAGNOSIS — K810 Acute cholecystitis: Secondary | ICD-10-CM | POA: Diagnosis not present

## 2024-01-20 DIAGNOSIS — R4589 Other symptoms and signs involving emotional state: Secondary | ICD-10-CM | POA: Diagnosis not present

## 2024-01-21 DIAGNOSIS — Y842 Radiological procedure and radiotherapy as the cause of abnormal reaction of the patient, or of later complication, without mention of misadventure at the time of the procedure: Secondary | ICD-10-CM | POA: Diagnosis not present

## 2024-01-21 DIAGNOSIS — L98491 Non-pressure chronic ulcer of skin of other sites limited to breakdown of skin: Secondary | ICD-10-CM | POA: Diagnosis not present

## 2024-01-21 DIAGNOSIS — L98429 Non-pressure chronic ulcer of back with unspecified severity: Secondary | ICD-10-CM | POA: Diagnosis not present

## 2024-01-21 DIAGNOSIS — B372 Candidiasis of skin and nail: Secondary | ICD-10-CM | POA: Diagnosis not present

## 2024-01-21 DIAGNOSIS — N360 Urethral fistula: Secondary | ICD-10-CM | POA: Diagnosis not present

## 2024-01-21 DIAGNOSIS — B379 Candidiasis, unspecified: Secondary | ICD-10-CM | POA: Diagnosis not present

## 2024-01-21 DIAGNOSIS — L98424 Non-pressure chronic ulcer of back with necrosis of bone: Secondary | ICD-10-CM | POA: Diagnosis not present

## 2024-01-21 DIAGNOSIS — G893 Neoplasm related pain (acute) (chronic): Secondary | ICD-10-CM | POA: Diagnosis not present

## 2024-01-21 DIAGNOSIS — C20 Malignant neoplasm of rectum: Secondary | ICD-10-CM | POA: Diagnosis not present

## 2024-01-21 DIAGNOSIS — I96 Gangrene, not elsewhere classified: Secondary | ICD-10-CM | POA: Diagnosis not present

## 2024-01-22 DIAGNOSIS — Y842 Radiological procedure and radiotherapy as the cause of abnormal reaction of the patient, or of later complication, without mention of misadventure at the time of the procedure: Secondary | ICD-10-CM | POA: Diagnosis not present

## 2024-01-22 DIAGNOSIS — K6289 Other specified diseases of anus and rectum: Secondary | ICD-10-CM | POA: Diagnosis not present

## 2024-01-22 DIAGNOSIS — L598 Other specified disorders of the skin and subcutaneous tissue related to radiation: Secondary | ICD-10-CM | POA: Diagnosis not present

## 2024-01-22 DIAGNOSIS — Z9889 Other specified postprocedural states: Secondary | ICD-10-CM | POA: Diagnosis not present

## 2024-01-22 DIAGNOSIS — S31501A Unspecified open wound of unspecified external genital organs, male, initial encounter: Secondary | ICD-10-CM | POA: Diagnosis not present

## 2024-01-22 DIAGNOSIS — L98424 Non-pressure chronic ulcer of back with necrosis of bone: Secondary | ICD-10-CM | POA: Diagnosis not present

## 2024-01-22 DIAGNOSIS — G893 Neoplasm related pain (acute) (chronic): Secondary | ICD-10-CM | POA: Diagnosis not present

## 2024-01-22 DIAGNOSIS — C20 Malignant neoplasm of rectum: Secondary | ICD-10-CM | POA: Diagnosis not present

## 2024-01-22 DIAGNOSIS — K819 Cholecystitis, unspecified: Secondary | ICD-10-CM | POA: Diagnosis not present

## 2024-01-23 DIAGNOSIS — G893 Neoplasm related pain (acute) (chronic): Secondary | ICD-10-CM | POA: Diagnosis not present

## 2024-01-23 DIAGNOSIS — K6289 Other specified diseases of anus and rectum: Secondary | ICD-10-CM | POA: Diagnosis not present

## 2024-01-23 DIAGNOSIS — K819 Cholecystitis, unspecified: Secondary | ICD-10-CM | POA: Diagnosis not present

## 2024-01-24 DIAGNOSIS — J986 Disorders of diaphragm: Secondary | ICD-10-CM | POA: Diagnosis not present

## 2024-01-24 DIAGNOSIS — J9811 Atelectasis: Secondary | ICD-10-CM | POA: Diagnosis not present

## 2024-01-24 DIAGNOSIS — K819 Cholecystitis, unspecified: Secondary | ICD-10-CM | POA: Diagnosis not present

## 2024-01-24 DIAGNOSIS — K6289 Other specified diseases of anus and rectum: Secondary | ICD-10-CM | POA: Diagnosis not present

## 2024-01-25 DIAGNOSIS — C20 Malignant neoplasm of rectum: Secondary | ICD-10-CM | POA: Diagnosis not present

## 2024-01-25 DIAGNOSIS — L598 Other specified disorders of the skin and subcutaneous tissue related to radiation: Secondary | ICD-10-CM | POA: Diagnosis not present

## 2024-01-25 DIAGNOSIS — Y842 Radiological procedure and radiotherapy as the cause of abnormal reaction of the patient, or of later complication, without mention of misadventure at the time of the procedure: Secondary | ICD-10-CM | POA: Diagnosis not present

## 2024-01-26 ENCOUNTER — Ambulatory Visit: Admitting: Gastroenterology

## 2024-01-26 DIAGNOSIS — Y842 Radiological procedure and radiotherapy as the cause of abnormal reaction of the patient, or of later complication, without mention of misadventure at the time of the procedure: Secondary | ICD-10-CM | POA: Diagnosis not present

## 2024-01-26 DIAGNOSIS — L598 Other specified disorders of the skin and subcutaneous tissue related to radiation: Secondary | ICD-10-CM | POA: Diagnosis not present

## 2024-01-27 DIAGNOSIS — Y842 Radiological procedure and radiotherapy as the cause of abnormal reaction of the patient, or of later complication, without mention of misadventure at the time of the procedure: Secondary | ICD-10-CM | POA: Diagnosis not present

## 2024-01-28 ENCOUNTER — Ambulatory Visit (HOSPITAL_COMMUNITY): Admit: 2024-01-28 | Admitting: Gastroenterology

## 2024-01-28 ENCOUNTER — Encounter (HOSPITAL_COMMUNITY): Payer: Self-pay

## 2024-01-28 DIAGNOSIS — Y842 Radiological procedure and radiotherapy as the cause of abnormal reaction of the patient, or of later complication, without mention of misadventure at the time of the procedure: Secondary | ICD-10-CM | POA: Diagnosis not present

## 2024-01-28 DIAGNOSIS — C20 Malignant neoplasm of rectum: Secondary | ICD-10-CM | POA: Diagnosis not present

## 2024-01-28 DIAGNOSIS — G893 Neoplasm related pain (acute) (chronic): Secondary | ICD-10-CM | POA: Diagnosis not present

## 2024-01-28 DIAGNOSIS — L598 Other specified disorders of the skin and subcutaneous tissue related to radiation: Secondary | ICD-10-CM | POA: Diagnosis not present

## 2024-01-28 SURGERY — COLONOSCOPY
Anesthesia: Monitor Anesthesia Care

## 2024-01-29 DIAGNOSIS — C20 Malignant neoplasm of rectum: Secondary | ICD-10-CM | POA: Diagnosis not present

## 2024-01-29 DIAGNOSIS — G893 Neoplasm related pain (acute) (chronic): Secondary | ICD-10-CM | POA: Diagnosis not present

## 2024-01-30 DIAGNOSIS — G893 Neoplasm related pain (acute) (chronic): Secondary | ICD-10-CM | POA: Diagnosis not present

## 2024-01-31 DIAGNOSIS — G893 Neoplasm related pain (acute) (chronic): Secondary | ICD-10-CM | POA: Diagnosis not present

## 2024-02-01 DIAGNOSIS — L598 Other specified disorders of the skin and subcutaneous tissue related to radiation: Secondary | ICD-10-CM | POA: Diagnosis not present

## 2024-02-01 DIAGNOSIS — Y842 Radiological procedure and radiotherapy as the cause of abnormal reaction of the patient, or of later complication, without mention of misadventure at the time of the procedure: Secondary | ICD-10-CM | POA: Diagnosis not present

## 2024-02-01 DIAGNOSIS — G893 Neoplasm related pain (acute) (chronic): Secondary | ICD-10-CM | POA: Diagnosis not present

## 2024-02-01 DIAGNOSIS — C20 Malignant neoplasm of rectum: Secondary | ICD-10-CM | POA: Diagnosis not present

## 2024-02-02 DIAGNOSIS — Z452 Encounter for adjustment and management of vascular access device: Secondary | ICD-10-CM | POA: Diagnosis not present

## 2024-02-02 DIAGNOSIS — R918 Other nonspecific abnormal finding of lung field: Secondary | ICD-10-CM | POA: Diagnosis not present

## 2024-02-02 DIAGNOSIS — L98424 Non-pressure chronic ulcer of back with necrosis of bone: Secondary | ICD-10-CM | POA: Diagnosis not present

## 2024-02-02 DIAGNOSIS — G893 Neoplasm related pain (acute) (chronic): Secondary | ICD-10-CM | POA: Diagnosis not present

## 2024-02-02 DIAGNOSIS — K6289 Other specified diseases of anus and rectum: Secondary | ICD-10-CM | POA: Diagnosis not present

## 2024-02-02 DIAGNOSIS — C20 Malignant neoplasm of rectum: Secondary | ICD-10-CM | POA: Diagnosis not present

## 2024-02-02 DIAGNOSIS — L598 Other specified disorders of the skin and subcutaneous tissue related to radiation: Secondary | ICD-10-CM | POA: Diagnosis not present

## 2024-02-02 DIAGNOSIS — Y842 Radiological procedure and radiotherapy as the cause of abnormal reaction of the patient, or of later complication, without mention of misadventure at the time of the procedure: Secondary | ICD-10-CM | POA: Diagnosis not present

## 2024-02-03 ENCOUNTER — Other Ambulatory Visit: Payer: Self-pay | Admitting: Hematology and Oncology

## 2024-02-03 DIAGNOSIS — C20 Malignant neoplasm of rectum: Secondary | ICD-10-CM | POA: Diagnosis not present

## 2024-02-03 DIAGNOSIS — Y842 Radiological procedure and radiotherapy as the cause of abnormal reaction of the patient, or of later complication, without mention of misadventure at the time of the procedure: Secondary | ICD-10-CM | POA: Diagnosis not present

## 2024-02-03 DIAGNOSIS — L598 Other specified disorders of the skin and subcutaneous tissue related to radiation: Secondary | ICD-10-CM | POA: Diagnosis not present

## 2024-02-04 DIAGNOSIS — Y842 Radiological procedure and radiotherapy as the cause of abnormal reaction of the patient, or of later complication, without mention of misadventure at the time of the procedure: Secondary | ICD-10-CM | POA: Diagnosis not present

## 2024-02-04 DIAGNOSIS — L598 Other specified disorders of the skin and subcutaneous tissue related to radiation: Secondary | ICD-10-CM | POA: Diagnosis not present

## 2024-02-04 DIAGNOSIS — C20 Malignant neoplasm of rectum: Secondary | ICD-10-CM | POA: Diagnosis not present

## 2024-02-05 DIAGNOSIS — L98429 Non-pressure chronic ulcer of back with unspecified severity: Secondary | ICD-10-CM | POA: Diagnosis not present

## 2024-02-05 DIAGNOSIS — C20 Malignant neoplasm of rectum: Secondary | ICD-10-CM | POA: Diagnosis not present

## 2024-02-07 DIAGNOSIS — C20 Malignant neoplasm of rectum: Secondary | ICD-10-CM | POA: Diagnosis not present

## 2024-02-08 ENCOUNTER — Encounter: Payer: Self-pay | Admitting: Hematology and Oncology

## 2024-02-08 DIAGNOSIS — C20 Malignant neoplasm of rectum: Secondary | ICD-10-CM | POA: Diagnosis not present

## 2024-02-09 DIAGNOSIS — C20 Malignant neoplasm of rectum: Secondary | ICD-10-CM | POA: Diagnosis not present

## 2024-02-10 DIAGNOSIS — C20 Malignant neoplasm of rectum: Secondary | ICD-10-CM | POA: Diagnosis not present

## 2024-02-11 ENCOUNTER — Encounter: Payer: Self-pay | Admitting: Nurse Practitioner

## 2024-02-11 ENCOUNTER — Inpatient Hospital Stay: Attending: Nurse Practitioner | Admitting: Nurse Practitioner

## 2024-02-11 VITALS — BP 86/61 | HR 114 | Temp 98.1°F | Resp 20

## 2024-02-11 DIAGNOSIS — R530 Neoplastic (malignant) related fatigue: Secondary | ICD-10-CM

## 2024-02-11 DIAGNOSIS — C20 Malignant neoplasm of rectum: Secondary | ICD-10-CM

## 2024-02-11 DIAGNOSIS — G893 Neoplasm related pain (acute) (chronic): Secondary | ICD-10-CM | POA: Diagnosis not present

## 2024-02-11 DIAGNOSIS — Z7189 Other specified counseling: Secondary | ICD-10-CM

## 2024-02-11 DIAGNOSIS — R630 Anorexia: Secondary | ICD-10-CM

## 2024-02-11 DIAGNOSIS — Z515 Encounter for palliative care: Secondary | ICD-10-CM | POA: Diagnosis not present

## 2024-02-11 DIAGNOSIS — R110 Nausea: Secondary | ICD-10-CM

## 2024-02-11 MED ORDER — ESCITALOPRAM OXALATE 5 MG PO TABS: 10.0000 mg | ORAL_TABLET | Freq: Every day | ORAL | Status: DC

## 2024-02-11 MED ORDER — CYCLOBENZAPRINE HCL 10 MG PO TABS: 10.0000 mg | ORAL_TABLET | Freq: Three times a day (TID) | ORAL | 3 refills | Status: DC | PRN

## 2024-02-11 MED ORDER — ESCITALOPRAM OXALATE 10 MG PO TABS: 10.0000 mg | ORAL_TABLET | Freq: Every day | ORAL | 3 refills | Status: DC

## 2024-02-11 MED ORDER — MORPHINE SULFATE ER 60 MG PO TBCR: 60.0000 mg | EXTENDED_RELEASE_TABLET | Freq: Two times a day (BID) | ORAL | 0 refills | Status: DC

## 2024-02-11 MED ORDER — MORPHINE SULFATE 15 MG PO TABS: 7.5000 mg | ORAL_TABLET | ORAL | 0 refills | Status: DC | PRN

## 2024-02-11 NOTE — Progress Notes (Signed)
 Palliative Medicine [facility]  [phone] [phone]   Name: [name] Date: 02/11/2024 MRN: [id number]  DOB: 08-02-1971  Patient Care Team: [name], MD as PCP - General (Family Medicine) Murleen Arms, MD as Consulting Physician (Hematology and Oncology) Joyce Nixon, MD as Consulting Physician (General Surgery) Causey, Laura Polio, NP as Nurse Practitioner (Hematology and Oncology) Johna Myers, MD as Consulting Physician (Radiation Oncology) Mansouraty, Albino Alu., MD as Consulting Physician (Gastroenterology) Roxane Copp, MD as Consulting Physician (Urology)    REASON FOR CONSULTATION: Steven [name] is a 53 y.o. male with oncologic medical history including stage II rectal adenocarcinoma s/p chemoradiation (2022), resection with ostomy, chronic pelvic abscess.  Palliative is seeing patient for symptom management and goals of care.    SOCIAL HISTORY:     reports that he has never smoked. He has never used smokeless tobacco. He reports current alcohol use. He reports that he does not use drugs.  ADVANCE DIRECTIVES:  None on file   CODE STATUS: Full code  PAST MEDICAL HISTORY: Past Medical History:  Diagnosis Date   Anemia 02/2021   iron    Anxiety    Asthma    Bilateral pulmonary embolism (HCC) 04/24/2019   Dehiscence of anastomosis of large intestine 12/31/2022   Depression    Diabetes mellitus without complication (HCC)    History of kidney stones    HLD (hyperlipidemia)    Hypertension    Kidney stones    Neuromuscular disorder (HCC)    from radiation bi lat   OSA (obstructive sleep apnea)    CPAP   Peripheral vascular disease (HCC)    DVT   rectal ca 02/2021   Sleep apnea     PAST SURGICAL HISTORY:  Past Surgical History:  Procedure Laterality Date   APPENDECTOMY  1989   open appy   APPLICATION OF WOUND VAC N/A 12/30/2023   Procedure: APPLICATION, WOUND VAC;  Surgeon: Oralee Billow, MD;  Location: [facility];  Service: General;  Laterality: N/A;   BLADDER REPAIR  01/07/2024   Procedure: Closure of bladder cystotomy/fistula;  Surgeon: Roxane Copp, MD;  Location: [facility];  Service: Urology;;   COLONOSCOPY  03/05/2021   CYST EXCISION  2011   left neck   DIVERTING ILEOSTOMY N/A 07/17/2021   Procedure: DIVERTING ILEOSTOMY;  Surgeon: Joyce Nixon, MD;  Location: [facility];  Service: General;  Laterality: N/A;   FLEXIBLE SIGMOIDOSCOPY N/A 04/04/2022   Procedure: FLEXIBLE SIGMOIDOSCOPY WITH POSSIBLE DEBRIDEMENT;  Surgeon: Joyce Nixon, MD;  Location: [facility];  Service: General;  Laterality: N/A;   INCISION AND DRAINAGE OF WOUND N/A 01/04/2024   Procedure: IRRIGATION AND DEBRIDEMENT WOUND; WOUND VAC REPLACEMENT;  Surgeon: Shela Derby, MD;  Location: [facility];  Service: General;  Laterality: N/A;   INCISION AND DRAINAGE PERIRECTAL ABSCESS N/A 12/16/2023   Procedure: DEBRIDEMENT OF POST OP WOUND;  Surgeon: Joyce Nixon, MD;  Location: [facility];  Service: General;  Laterality: N/A;  I & D PERIONEAL WOUND   IRRIGATION AND DEBRIDEMENT ABSCESS N/A 12/30/2023   Procedure: IRRIGATION AND DEBRIDEMENT ABSCESS;  Surgeon: Oralee Billow, MD;  Location: [facility];  Service: General;  Laterality: N/A;   IRRIGATION AND DEBRIDEMENT ABSCESS N/A 01/01/2024   Procedure: IRRIGATION AND DEBRIDEMENT ABSCESS;  Surgeon: Oralee Billow, MD;  Location: [facility];  Service: General;  Laterality: N/A;   IRRIGATION AND DEBRIDEMENT ABSCESS N/A 01/07/2024   Procedure: IRRIGATION AND DEBRIDEMENT ABSCESS (N/A) - WOUND EXPLORATION AND  DEBRIDEMENT.  CHANGE WOUND VAC DRESSING.  IRRIGATION AND DEBRIDEMENT OF ABDOMINAL WALL ABSCESS.;  Surgeon: Shela Derby, MD;  Location: [facility];  Service: General;  Laterality: N/A;   IRRIGATION AND DEBRIDEMENT ABSCESS N/A 01/11/2024   Procedure: EUA, WOUND VAC CHANGE, EXCISIONAL DEBRIDEMENT OF TISSUE,  MUSCLE AND PERINIEUM.;  Surgeon: Aldean Hummingbird, MD;  Location: [facility];   Service: General;  Laterality: N/A;   RECTAL EXAM UNDER ANESTHESIA N/A 04/04/2022   Procedure: ANAL EXAM UNDER ANESTHESIA;  Surgeon: Joyce Nixon, MD;  Location: [facility];  Service: General;  Laterality: N/A;   RECTAL EXAM UNDER ANESTHESIA N/A 12/25/2023   Procedure: EXAM UNDER ANESTHESIA, RECTUM;  Surgeon: Sim Dryer, MD;  Location: [facility];  Service: General;  Laterality: N/A;   RECTOPEXY N/A 12/02/2023   Procedure: Perineal Proctatectomy;  Surgeon: Joyce Nixon, MD;  Location: [facility];  Service: General;  Laterality: N/A;  Completion Proctatectomy   WISDOM TOOTH EXTRACTION     age 43   XI ROBOTIC ASSISTED LOWER ANTERIOR RESECTION N/A 07/17/2021   Procedure: XI ROBOTIC ASSISTED LOWER ANTERIOR RESECTION;  Surgeon: Joyce Nixon, MD;  Location: [facility];  Service: General;  Laterality: N/A;   XI ROBOTIC ASSISTED LOWER ANTERIOR RESECTION N/A 12/31/2022   Procedure: ROBOTIC ASSISTED LOWER ANTERIOR RESECTION WITH OSTOMY with intraop ICG guided perfusion, takedown of iliostomy;  Surgeon: Joyce Nixon, MD;  Location: [facility];  Service: General;  Laterality: N/A;    HEMATOLOGY/ONCOLOGY HISTORY:  Oncology History  Adenocarcinoma of rectum (HCC)  03/05/2021 Cancer Staging   Staging form: Colon and Rectum, AJCC 8th Edition - Clinical stage from 03/05/2021: Stage IIA (cT3, cN0, cM0) - Signed by Sonja Hutton, MD on 07/29/2021 Stage prefix: Initial diagnosis Total positive nodes: 0   03/21/2021 Initial Diagnosis   Adenocarcinoma of rectum (HCC)   04/03/2021 - 05/10/2021 Chemotherapy    Patient is on Treatment Plan: COLORECTAL CAPECITABINE  + XRT       04/03/2021 - 05/10/2021 Radiation Therapy   Neoadjuvant chemo RT with Xeloda  Site Technique Total Dose (Gy) Dose per Fx (Gy) Completed Fx Beam Energies  Rectum: Rectum 3D 45/45 1.8 25/25 10X, 15X  Rectum: Rectum_Bst 3D 5.4/5.4 1.8 3/3 15X     07/17/2021 Cancer Staging   Staging form: Colon and Rectum, AJCC 8th Edition - Pathologic  stage from 07/17/2021: ypT0, pN0, cM0 - Signed by Sonja Chain-O-Lakes, MD on 07/29/2021 Stage prefix: Post-therapy Total positive nodes: 0 Residual tumor (R): R0 - None   07/17/2021 Surgery   XI ROBOTIC ASSISTED LOWER ANTERIOR RESECTION DIVERTING ILEOSTOMY INTRAOPERATIVE ASSESSMENT OF PERFUSION By Dr. Joyce Nixon     09/09/2021 - 01/05/2022 Adjuvant Chemotherapy   Adjuvant Xeloda , completed 6 cycles   04/09/2022 Survivorship   SCP delivered by Lacie Burton, NP     ALLERGIES:  is allergic to contrast media [iodinated contrast media].  MEDICATIONS:  Current Outpatient Medications  Medication Sig Dispense Refill   ALPRAZolam  (XANAX ) 0.5 MG tablet Take 0.5 mg by mouth 2 (two) times daily as needed for anxiety or sleep.     cyclobenzaprine (FLEXERIL) 10 MG tablet Take 1 tablet (10 mg total) by mouth 3 (three) times daily as needed for muscle spasms. 60 tablet 3   nortriptyline (PAMELOR) 10 MG capsule Take 10 mg by mouth 2 (two) times daily.     acetaminophen  (TYLENOL ) 500 MG tablet Take 1,000 mg by mouth every 6 (six) hours as needed for mild pain.     albuterol  (PROAIR  HFA) 108 (90 Base) MCG/ACT inhaler  Inhale 2 puffs into the lungs every 4 (four) hours as needed for wheezing or shortness of breath.     ascorbic acid  (VITAMIN C ) 500 MG tablet Take 1 tablet (500 mg total) by mouth 2 (two) times daily.     calcium  carbonate (TUMS - DOSED IN MG ELEMENTAL CALCIUM ) 500 MG chewable tablet Chew 2 tablets by mouth daily as needed for indigestion or heartburn.     Dapagliflozin  Pro-metFORMIN  ER (XIGDUO  XR) 06-999 MG TB24 Take 1 tablet by mouth daily.     diclofenac  Sodium (VOLTAREN ) 1 % GEL Apply 2 g topically every 6 (six) hours as needed (pain in leg).     diphenhydrAMINE  (BENADRYL ) 25 mg capsule Take 1 capsule (25 mg total) by mouth every 6 (six) hours as needed for itching.     enoxaparin  (LOVENOX ) 30 MG/0.3ML injection Inject 0.3 mLs (30 mg total) into the skin every 12 (twelve) hours.      escitalopram  (LEXAPRO ) 10 MG tablet Take 1 tablet (10 mg total) by mouth at bedtime. 30 tablet 3   fenofibrate  160 MG tablet Take 160 mg by mouth daily.     ferrous sulfate  325 (65 FE) MG tablet Take 1 tablet (325 mg total) by mouth 2 (two) times daily with a meal.     Gauze Pads & Dressings (COMBINE ABD) 5"X9" PADS 1 Units by Does not apply route daily. 30 each 3   Gauze Pads & Dressings (KERLIX GAUZE ROLL LARGE) MISC 1 Units by Does not apply route daily. 30 each 3   magic mouthwash w/lidocaine  SOLN Take 10 mLs by mouth 3 (three) times daily. Suspension contains equal amounts of Maalox Extra Strength, nystatin , diphenhydramine  and lidocaine .     morphine  (MS CONTIN ) 60 MG 12 hr tablet Take 1 tablet (60 mg total) by mouth every 12 (twelve) hours. 60 tablet 0   morphine  (MSIR) 15 MG tablet Take 0.5-1 tablets (7.5-15 mg total) by mouth every 4 (four) hours as needed for moderate pain (pain score 4-6) or severe pain (pain score 7-10). 90 tablet 0   naphazoline-glycerin  (CLEAR EYES REDNESS) 0.012-0.25 % SOLN Place 1-2 drops into both eyes 4 (four) times daily as needed for eye irritation.     nystatin  (MYCOSTATIN /NYSTOP ) powder Apply topically 2 (two) times daily.     piperacillin -tazobactam (ZOSYN ) 3.375 GM/50ML IVPB Inject 50 mLs (3.375 g total) into the vein every 8 (eight) hours.     polycarbophil (FIBERCON) 625 MG tablet Take 1 tablet (625 mg total) by mouth 2 (two) times daily.     rivaroxaban  (XARELTO ) 20 MG TABS tablet Take 1 tablet (20 mg total) by mouth daily with supper. 90 tablet 3   Simethicone  (GAS-X PO) Take 1-2 tablets by mouth as needed (gas).     tetrahydrozoline-zinc  (VISINE-AC) 0.05-0.25 % ophthalmic solution Place 2 drops into both eyes daily as needed (allergy).     zinc  sulfate, 50mg  elemental zinc , 220 (50 Zn) MG capsule Take 1 capsule (220 mg total) by mouth daily.     No current facility-administered medications for this visit.    VITAL SIGNS: BP (!) 86/61 (BP Location:  Right Arm, Patient Position: Sitting)   Pulse (!) 114   Temp 98.1 F (36.7 C) (Temporal)   Resp 20   SpO2 100%  Filed Weights    Estimated body mass index is 26.26 kg/m as calculated from the following:   Height as of 01/11/24: 5\' 10"  (1.778 m).   Weight as of 01/12/24: 182 lb 15.7 oz (  83 kg).  LABS: CBC:    Component Value Date/Time   WBC 6.2 01/13/2024 0430   HGB 7.1 (L) 01/13/2024 0430   HGB 11.7 (L) 12/05/2021 0956   HCT 23.4 (L) 01/13/2024 0430   PLT 412 (H) 01/13/2024 0430   PLT 274 12/05/2021 0956   MCV 86.0 01/13/2024 0430   NEUTROABS 8.8 (H) 01/02/2024 0517   LYMPHSABS 1.3 01/02/2024 0517   MONOABS 0.6 01/02/2024 0517   EOSABS 0.0 01/02/2024 0517   BASOSABS 0.0 01/02/2024 0517   Comprehensive Metabolic Panel:    Component Value Date/Time   NA 136 01/13/2024 0430   K 4.0 01/13/2024 0430   CL 104 01/13/2024 0430   CO2 26 01/13/2024 0430   BUN 31 (H) 01/13/2024 0430   CREATININE 0.56 (L) 01/13/2024 0430   CREATININE 1.09 10/27/2023 1014   GLUCOSE 143 (H) 01/13/2024 0430   CALCIUM  8.1 (L) 01/13/2024 0430   AST 15 01/13/2024 0430   AST 11 (L) 10/27/2023 1014   ALT 10 01/13/2024 0430   ALT 11 10/27/2023 1014   ALKPHOS 138 (H) 01/13/2024 0430   BILITOT 0.5 01/13/2024 0430   BILITOT 0.5 10/27/2023 1014   PROT 5.6 (L) 01/13/2024 0430   ALBUMIN  1.5 (L) 01/13/2024 0430    RADIOGRAPHIC STUDIES: No results found.  PERFORMANCE STATUS (ECOG) : 2 - Symptomatic, <50% confined to bed  Review of Systems  Constitutional:  Positive for activity change, appetite change and fatigue.  Gastrointestinal:  Positive for abdominal pain and rectal pain.       Colostomy, rectal wound    Unless otherwise noted, a complete review of systems is negative.  Physical Exam General: NAD, uncomfortable in sitting position, pillow in place Cardiovascular: regular rate and rhythm Pulmonary: clear ant fields Abdomen: soft, nontender, + bowel sounds, colostomy in place Extremities:  no edema, no joint deformities Skin: no rashes, sacral wound not assessed Neurological: Alert and oriented x3  IMPRESSION: Discussed the use of AI scribe software for clinical note transcription with the patient, who gave verbal consent to proceed.  History of Present Illness Steven [name] is a 53 year old male who presents to clinic for initial outpatient palliative visit. Patient initially seen during recent hospitalization for symptom management. He is accompanied by his wife, Kingsley Penny. Patient is uncomfortable sitting on sacrum requiring frequent positioning and pillow support. He is alert and able to engage appropriately in discussions.   I introduced myself, Maygan RN, and Palliative's role in collaboration with the oncology team. Concept of Palliative Care was introduced as specialized medical care for people and their families living with serious illness.  It focuses on providing relief from the symptoms and stress of a serious illness.  The goal is to improve quality of life for both the patient and the family. Values and goals of care important to patient and family were attempted to be elicited.   Mr. Grindstaff lives in the home with wife of 21 years. Today is their anniversary. They have 3 daughters ages 34, 28, and 70. Mr. Valtierra is disabled. He was recently hospitalized at [facility] followed by an extended admission at [facility].  Clear liquid diet only. His appetite is poor, and he is currently on total parenteral nutrition (TPN) for 12 hours overnight due to a small bowel fistula that is leaking into the wound area. During visit his colostomy began leaking requiring support and bag change. Patient and wife reports in the home they are managing ostomy and wound themselves due to  lack of availability in home health resources. A nurse does come out weekly to perform PICC line dressing changes and provide supplies for TPN.   He was recently discharged from the hospital after a prolonged stay of  seven to nine weeks and is scheduled for additional surgeries. He experiences significant pain, particularly when sitting, and spends much of his time in a prone position to alleviate pressure on his wound. His current medications include morphine  extended release 60 mg every 12 hours, morphine  immediate release 15 mg every 4 hours, Robaxin  every 8 hours, Tylenol  up to 3000 mg per day, Lexapro  10 mg at bedtime, and Xarelto  20 mg. The morphine  generally helps with pain control, although not always completely.  Jeferson shares he has hard time tolerating Robaxin  which causes him nausea and gag reflex. Tolerating pain regimen. Education provided on palliative's role in collaboration to his oncology team. He reports plans for additional surgical procedure on Monday. At this time we will continue with MS Contin  and MS IR current dosing without adjustments. Patient's blood pressure is on the lower side however asymptomatic. We discussed discontinuing Robaxin  and switching to Flexeril to see if patient tolerates. He and wife verbalized understanding and appreciation. All prescriptions have been refilled. Patient aware we will continue to closely monitor and adjust accordingly.   He has a history of diabetes and was previously on Vyzulta, Farxiga , and metformin , but has not resumed these medications since his hospitalization. He received insulin  during his hospital stay but has not been on any diabetes medications since discharge. Encouraged patient to schedule follow-up with PCP for further management.   Goals of Care We discussed his current illness and what it means in the larger context of ihs on-going co-morbidities. Natural disease trajectory and expectations were discussed. Mr. Vanscoyk and his wife are realistic in their understanding of current conditions and plan of care. He is remaining hopeful for stability and improvement in the near future. He is emotional expressing his decreased quality of life and  health challenges. Continues to take things one day at a time as his goals are clear to continue to treat the treatable aggressively allowing him every opportunity to thrive while managing symptoms.   I discussed the importance of continued conversation with family and their medical providers regarding overall plan of care and treatment options, ensuring decisions are within the context of the patients values and GOCs. Assessment & Plan Established therapeutic relationship. Education provided on palliative's role in collaboration with their Oncology/Radiation team.  Post-surgical wound with fistula Post-surgical wound with fistula leaking. No current wound care provider. Scheduled for outpatient surgery on Monday. - Discuss wound care supplies with surgeon during surgery on Monday. - Ensure adequate wound care supplies until Monday.  Cancer Related pain management Pain from post-surgical wound managed with morphine . Pain controlled but not fully alleviated. Side effects include somnolence and diaphoresis. Hypotension limits additional pain relief in office. - Continue morphine  extended release 60 mg every 12 hours. - Continue morphine  immediate release 15 mg every 4 hours as needed. - Refill prescriptions for morphine  and other medications. - Discontinue Robaxin  due to nausea. -Flexeril 10mg  every 8 hours as needed.   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 TPN administered at night. Home health assists with TPN management. - Continue TPN administration at night for 12 hours. - Coordinate with home health for TPN management and dressing changes.  Depression Depression managed with Lexapro . No current issues reported. - Continue Lexapro  10  mg at bedtime. - Refill Lexapro  prescription.  Goals of Care Patient remains hopeful. Clear in expressed wishes to treat the treatable allowing him every opportunity to thrive while managing symptoms to decrease symptom burden.    I will plan to follow-up with patient in a week by phone.   Patient expressed understanding and was in agreement with this plan. He also understands that He can call the clinic at any time with any questions, concerns, or complaints.   Thank you for your referral and allowing Palliative to assist in Mr. Manuelito Beeghly care.   Number and complexity of problems addressed: HIGH - 1 or more chronic illnesses with SEVERE exacerbation, progression, or side effects of treatment - advanced cancer, pain. Any controlled substances utilized were prescribed in the context of palliative care.  Visit consisted of counseling and education dealing with the complex and emotionally intense issues of symptom management and palliative care in the setting of serious and potentially life-threatening illness.  Signed by: Dellia Ferguson, AGPCNP-BC Palliative Medicine Team/[facility]

## 2024-02-12 DIAGNOSIS — C20 Malignant neoplasm of rectum: Secondary | ICD-10-CM | POA: Diagnosis not present

## 2024-02-13 DIAGNOSIS — C20 Malignant neoplasm of rectum: Secondary | ICD-10-CM | POA: Diagnosis not present

## 2024-02-15 DIAGNOSIS — T66XXXA Radiation sickness, unspecified, initial encounter: Secondary | ICD-10-CM | POA: Diagnosis not present

## 2024-02-15 DIAGNOSIS — Y838 Other surgical procedures as the cause of abnormal reaction of the patient, or of later complication, without mention of misadventure at the time of the procedure: Secondary | ICD-10-CM | POA: Diagnosis not present

## 2024-02-15 DIAGNOSIS — Z91041 Radiographic dye allergy status: Secondary | ICD-10-CM | POA: Diagnosis not present

## 2024-02-15 DIAGNOSIS — T8183XA Persistent postprocedural fistula, initial encounter: Secondary | ICD-10-CM | POA: Diagnosis not present

## 2024-02-15 DIAGNOSIS — F419 Anxiety disorder, unspecified: Secondary | ICD-10-CM | POA: Diagnosis not present

## 2024-02-15 DIAGNOSIS — C20 Malignant neoplasm of rectum: Secondary | ICD-10-CM | POA: Diagnosis not present

## 2024-02-15 DIAGNOSIS — Z79899 Other long term (current) drug therapy: Secondary | ICD-10-CM | POA: Diagnosis not present

## 2024-02-15 DIAGNOSIS — I10 Essential (primary) hypertension: Secondary | ICD-10-CM | POA: Diagnosis not present

## 2024-02-15 DIAGNOSIS — N360 Urethral fistula: Secondary | ICD-10-CM | POA: Diagnosis not present

## 2024-02-15 DIAGNOSIS — Z933 Colostomy status: Secondary | ICD-10-CM | POA: Diagnosis not present

## 2024-02-15 DIAGNOSIS — N4289 Other specified disorders of prostate: Secondary | ICD-10-CM | POA: Diagnosis not present

## 2024-02-15 DIAGNOSIS — Z86711 Personal history of pulmonary embolism: Secondary | ICD-10-CM | POA: Diagnosis not present

## 2024-02-15 DIAGNOSIS — G4733 Obstructive sleep apnea (adult) (pediatric): Secondary | ICD-10-CM | POA: Diagnosis not present

## 2024-02-15 DIAGNOSIS — E43 Unspecified severe protein-calorie malnutrition: Secondary | ICD-10-CM | POA: Diagnosis not present

## 2024-02-15 DIAGNOSIS — Z86718 Personal history of other venous thrombosis and embolism: Secondary | ICD-10-CM | POA: Diagnosis not present

## 2024-02-15 DIAGNOSIS — Y842 Radiological procedure and radiotherapy as the cause of abnormal reaction of the patient, or of later complication, without mention of misadventure at the time of the procedure: Secondary | ICD-10-CM | POA: Diagnosis not present

## 2024-02-15 DIAGNOSIS — Z9049 Acquired absence of other specified parts of digestive tract: Secondary | ICD-10-CM | POA: Diagnosis not present

## 2024-02-15 DIAGNOSIS — Z85048 Personal history of other malignant neoplasm of rectum, rectosigmoid junction, and anus: Secondary | ICD-10-CM | POA: Diagnosis not present

## 2024-02-15 DIAGNOSIS — R590 Localized enlarged lymph nodes: Secondary | ICD-10-CM | POA: Diagnosis not present

## 2024-02-15 DIAGNOSIS — Z9351 Cutaneous-vesicostomy status: Secondary | ICD-10-CM | POA: Diagnosis not present

## 2024-02-15 DIAGNOSIS — Z7901 Long term (current) use of anticoagulants: Secondary | ICD-10-CM | POA: Diagnosis not present

## 2024-02-15 DIAGNOSIS — T8189XA Other complications of procedures, not elsewhere classified, initial encounter: Secondary | ICD-10-CM | POA: Diagnosis not present

## 2024-02-15 DIAGNOSIS — N321 Vesicointestinal fistula: Secondary | ICD-10-CM | POA: Diagnosis not present

## 2024-02-15 DIAGNOSIS — E559 Vitamin D deficiency, unspecified: Secondary | ICD-10-CM | POA: Diagnosis not present

## 2024-02-15 DIAGNOSIS — Z794 Long term (current) use of insulin: Secondary | ICD-10-CM | POA: Diagnosis not present

## 2024-02-15 DIAGNOSIS — E119 Type 2 diabetes mellitus without complications: Secondary | ICD-10-CM | POA: Diagnosis not present

## 2024-02-15 DIAGNOSIS — K632 Fistula of intestine: Secondary | ICD-10-CM | POA: Diagnosis not present

## 2024-02-15 DIAGNOSIS — G4700 Insomnia, unspecified: Secondary | ICD-10-CM | POA: Diagnosis not present

## 2024-02-16 ENCOUNTER — Encounter (HOSPITAL_BASED_OUTPATIENT_CLINIC_OR_DEPARTMENT_OTHER): Attending: Internal Medicine | Admitting: Internal Medicine

## 2024-02-16 DIAGNOSIS — C20 Malignant neoplasm of rectum: Secondary | ICD-10-CM | POA: Insufficient documentation

## 2024-02-16 DIAGNOSIS — L598 Other specified disorders of the skin and subcutaneous tissue related to radiation: Secondary | ICD-10-CM | POA: Insufficient documentation

## 2024-02-16 DIAGNOSIS — Z9221 Personal history of antineoplastic chemotherapy: Secondary | ICD-10-CM | POA: Insufficient documentation

## 2024-02-16 DIAGNOSIS — Z933 Colostomy status: Secondary | ICD-10-CM | POA: Insufficient documentation

## 2024-02-16 DIAGNOSIS — S31000A Unspecified open wound of lower back and pelvis without penetration into retroperitoneum, initial encounter: Secondary | ICD-10-CM | POA: Diagnosis not present

## 2024-02-16 DIAGNOSIS — E11622 Type 2 diabetes mellitus with other skin ulcer: Secondary | ICD-10-CM | POA: Diagnosis not present

## 2024-02-16 DIAGNOSIS — W888XXA Exposure to other ionizing radiation, initial encounter: Secondary | ICD-10-CM | POA: Diagnosis not present

## 2024-02-17 ENCOUNTER — Encounter: Payer: Self-pay | Admitting: Nurse Practitioner

## 2024-02-17 ENCOUNTER — Inpatient Hospital Stay (HOSPITAL_BASED_OUTPATIENT_CLINIC_OR_DEPARTMENT_OTHER): Admitting: Nurse Practitioner

## 2024-02-17 ENCOUNTER — Encounter (HOSPITAL_BASED_OUTPATIENT_CLINIC_OR_DEPARTMENT_OTHER): Admitting: Internal Medicine

## 2024-02-17 DIAGNOSIS — G893 Neoplasm related pain (acute) (chronic): Secondary | ICD-10-CM | POA: Diagnosis not present

## 2024-02-17 DIAGNOSIS — Z515 Encounter for palliative care: Secondary | ICD-10-CM | POA: Diagnosis not present

## 2024-02-17 DIAGNOSIS — R530 Neoplastic (malignant) related fatigue: Secondary | ICD-10-CM | POA: Diagnosis not present

## 2024-02-17 DIAGNOSIS — S31501A Unspecified open wound of unspecified external genital organs, male, initial encounter: Secondary | ICD-10-CM | POA: Diagnosis not present

## 2024-02-17 DIAGNOSIS — C20 Malignant neoplasm of rectum: Secondary | ICD-10-CM | POA: Diagnosis not present

## 2024-02-17 NOTE — Progress Notes (Signed)
 Palliative Medicine [facility]  [phone] [phone]   Name: Steven [name] Date: 02/17/2024 MRN: [id number]  DOB: Jul 16, 1971  Patient Care Team: [name], MD as PCP - General (Family Medicine) Murleen Arms, MD as Consulting Physician (Hematology and Oncology) Joyce Nixon, MD as Consulting Physician (General Surgery) Causey, Laura Polio, NP as Nurse Practitioner (Hematology and Oncology) Johna Myers, MD as Consulting Physician (Radiation Oncology) Mansouraty, Albino Alu., MD as Consulting Physician (Gastroenterology) Roxane Copp, MD as Consulting Physician (Urology) Pickenpack-Cousar, Giles Labrum, NP as Nurse Practitioner [facility] and Palliative Medicine)   I connected with Grayland Le on 02/17/24 at  1:30 PM EDT by telephone and verified that I am speaking with the correct person using two identifiers.   I discussed the limitations, risks, security and privacy concerns of performing an evaluation and management service by telemedicine and the availability of in-person appointments. I also discussed with the patient that there may be a patient responsible charge related to this service. The patient expressed understanding and agreed to proceed.   Other persons participating in the visit and their role in the encounter: N/A   Patient's location: Home   Provider's location: [facility]   INTERVAL HISTORY: Steven [name] is a 53 y.o. male with oncologic medical history including stage II rectal adenocarcinoma s/p chemoradiation (2022), resection with ostomy, chronic pelvic abscess.  Palliative is seeing patient for symptom management and goals of care.   SOCIAL HISTORY:     reports that he has never smoked. He has never used smokeless tobacco. He reports current alcohol use. He reports that he does not use drugs.  ADVANCE DIRECTIVES:  None on file   CODE STATUS: Full code  PAST MEDICAL HISTORY: Past Medical History:   Diagnosis Date   Anemia 02/2021   iron    Anxiety    Asthma    Bilateral pulmonary embolism (HCC) 04/24/2019   Dehiscence of anastomosis of large intestine 12/31/2022   Depression    Diabetes mellitus without complication (HCC)    History of kidney stones    HLD (hyperlipidemia)    Hypertension    Kidney stones    Neuromuscular disorder (HCC)    from radiation bi lat   OSA (obstructive sleep apnea)    CPAP   Peripheral vascular disease (HCC)    DVT   rectal ca 02/2021   Sleep apnea     ALLERGIES:  is allergic to contrast media [iodinated contrast media].  MEDICATIONS:  Current Outpatient Medications  Medication Sig Dispense Refill   acetaminophen  (TYLENOL ) 500 MG tablet Take 1,000 mg by mouth every 6 (six) hours as needed for mild pain.     albuterol  (PROAIR  HFA) 108 (90 Base) MCG/ACT inhaler Inhale 2 puffs into the lungs every 4 (four) hours as needed for wheezing or shortness of breath.     ALPRAZolam  (XANAX ) 0.5 MG tablet Take 0.5 mg by mouth 2 (two) times daily as needed for anxiety or sleep.     ascorbic acid  (VITAMIN C ) 500 MG tablet Take 1 tablet (500 mg total) by mouth 2 (two) times daily.     calcium  carbonate (TUMS - DOSED IN MG ELEMENTAL CALCIUM ) 500 MG chewable tablet Chew 2 tablets by mouth daily as needed for indigestion or heartburn.     cyclobenzaprine  (FLEXERIL ) 10 MG tablet Take 1 tablet (10 mg total) by mouth 3 (three) times daily as needed for muscle spasms. 60 tablet 3   Dapagliflozin  Pro-metFORMIN  ER (XIGDUO  XR) 06-999  MG TB24 Take 1 tablet by mouth daily.     diclofenac  Sodium (VOLTAREN ) 1 % GEL Apply 2 g topically every 6 (six) hours as needed (pain in leg).     diphenhydrAMINE  (BENADRYL ) 25 mg capsule Take 1 capsule (25 mg total) by mouth every 6 (six) hours as needed for itching.     enoxaparin  (LOVENOX ) 30 MG/0.3ML injection Inject 0.3 mLs (30 mg total) into the skin every 12 (twelve) hours.     escitalopram  (LEXAPRO ) 10 MG tablet Take 1 tablet (10  mg total) by mouth at bedtime. 30 tablet 3   fenofibrate  160 MG tablet Take 160 mg by mouth daily.     ferrous sulfate  325 (65 FE) MG tablet Take 1 tablet (325 mg total) by mouth 2 (two) times daily with a meal.     Gauze Pads & Dressings (COMBINE ABD) 5"X9" PADS 1 Units by Does not apply route daily. 30 each 3   Gauze Pads & Dressings (KERLIX GAUZE ROLL LARGE) MISC 1 Units by Does not apply route daily. 30 each 3   magic mouthwash w/lidocaine  SOLN Take 10 mLs by mouth 3 (three) times daily. Suspension contains equal amounts of Maalox Extra Strength, nystatin , diphenhydramine  and lidocaine .     morphine  (MS CONTIN ) 60 MG 12 hr tablet Take 1 tablet (60 mg total) by mouth every 12 (twelve) hours. 60 tablet 0   morphine  (MSIR) 15 MG tablet Take 0.5-1 tablets (7.5-15 mg total) by mouth every 4 (four) hours as needed for moderate pain (pain score 4-6) or severe pain (pain score 7-10). 90 tablet 0   naphazoline-glycerin  (CLEAR EYES REDNESS) 0.012-0.25 % SOLN Place 1-2 drops into both eyes 4 (four) times daily as needed for eye irritation.     nortriptyline (PAMELOR) 10 MG capsule Take 10 mg by mouth 2 (two) times daily.     nystatin  (MYCOSTATIN /NYSTOP ) powder Apply topically 2 (two) times daily.     piperacillin -tazobactam (ZOSYN ) 3.375 GM/50ML IVPB Inject 50 mLs (3.375 g total) into the vein every 8 (eight) hours.     polycarbophil (FIBERCON) 625 MG tablet Take 1 tablet (625 mg total) by mouth 2 (two) times daily.     rivaroxaban  (XARELTO ) 20 MG TABS tablet Take 1 tablet (20 mg total) by mouth daily with supper. 90 tablet 3   Simethicone  (GAS-X PO) Take 1-2 tablets by mouth as needed (gas).     tetrahydrozoline-zinc  (VISINE-AC) 0.05-0.25 % ophthalmic solution Place 2 drops into both eyes daily as needed (allergy).     zinc  sulfate, 50mg  elemental zinc , 220 (50 Zn) MG capsule Take 1 capsule (220 mg total) by mouth daily.     No current facility-administered medications for this visit.    VITAL  SIGNS: There were no vitals taken for this visit. There were no vitals filed for this visit.  Estimated body mass index is 26.26 kg/m as calculated from the following:   Height as of 01/11/24: 5\' 10"  (1.778 m).   Weight as of 01/12/24: 182 lb 15.7 oz (83 kg).   PERFORMANCE STATUS (ECOG) : 1 - Symptomatic but completely ambulatory  IMPRESSION: Discussed the use of AI scribe software for clinical note transcription with the patient, who gave verbal consent to proceed.  History of Present Illness I connected by phone with Mr. Tenpenny for symptom management follow-up. He is doing well overall. No acute distress. Denies concerns for nausea, vomiting, constipation, or diarrhea.  Continues on TPN as prescribed, allowing for clear liquids only.  Patient reports  his pain is well-controlled on current regimen.  He is currently taking MS Contin  every 12 hours in addition to MS IR every 4 hours as needed for breakthrough pain. He prefers flexeril  over Robaxin  for managing muscle spasms, finding it more effective and appreciating the absence of a gagging reflex, which he describes as 'wonderful'.  No adjustments to current regimen at this time.  We will continue to closely monitor and support.  Goals of Care 02/11/24: We discussed his current illness and what it means in the larger context of ihs on-going co-morbidities. Natural disease trajectory and expectations were discussed. Mr. Turgeon and his wife are realistic in their understanding of current conditions and plan of care. He is remaining hopeful for stability and improvement in the near future. He is emotional expressing his decreased quality of life and health challenges. Continues to take things one day at a time as his goals are clear to continue to treat the treatable aggressively allowing him every opportunity to thrive while managing symptoms.   I discussed the importance of continued conversation with family and their medical providers regarding  overall plan of care and treatment options, ensuring decisions are within the context of the patients values and GOCs. Assessment & Plan Muscle Spasms He tolerates flexeril  well, compared to robaxin . With effective management of spasms.  Cancer Related pain management Pain from post-surgical wound managed with morphine . Pain controlled but not fully alleviated. Side effects include somnolence and diaphoresis.  - Continue morphine  extended release 60 mg every 12 hours. - Continue morphine  immediate release 15 mg every 4 hours as needed. - Refill prescriptions for morphine  and other medications. -Flexeril  10mg  every 8 hours as needed.    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 TPN administered at night. Home health assists with TPN management. - Continue TPN administration at night for 12 hours. - Coordinate with home health for TPN management and dressing changes.   Depression Depression managed with Lexapro . No current issues reported. - Continue Lexapro  10 mg at bedtime. - Refill Lexapro  prescription.  Follow-up Follow-up planned to reassess management and address medication needs. - Schedule follow-up in two weeks. - Contact provider if refills needed before appointment.  Patient expressed understanding and was in agreement with this plan. He also understands that He can call the clinic at any time with any questions, concerns, or complaints.   Any controlled substances utilized were prescribed in the context of palliative care. PDMP has been reviewed.   I provided 25 minutes of non face-to-face telephone visit time during this encounter, and > 50% was spent counseling as documented under my assessment & plan. Visit consisted of counseling and education dealing with the complex and emotionally intense issues of symptom management and palliative care in the setting of serious and potentially life-threatening illness.  Dellia Ferguson, AGPCNP-BC   Palliative Medicine Team/[facility]

## 2024-02-18 ENCOUNTER — Encounter (HOSPITAL_BASED_OUTPATIENT_CLINIC_OR_DEPARTMENT_OTHER): Admitting: Internal Medicine

## 2024-02-19 DIAGNOSIS — C20 Malignant neoplasm of rectum: Secondary | ICD-10-CM | POA: Diagnosis not present

## 2024-02-20 DIAGNOSIS — C20 Malignant neoplasm of rectum: Secondary | ICD-10-CM | POA: Diagnosis not present

## 2024-02-22 DIAGNOSIS — C20 Malignant neoplasm of rectum: Secondary | ICD-10-CM | POA: Diagnosis not present

## 2024-02-23 ENCOUNTER — Encounter (HOSPITAL_BASED_OUTPATIENT_CLINIC_OR_DEPARTMENT_OTHER): Attending: Internal Medicine | Admitting: Internal Medicine

## 2024-02-23 DIAGNOSIS — X58XXXA Exposure to other specified factors, initial encounter: Secondary | ICD-10-CM | POA: Insufficient documentation

## 2024-02-23 DIAGNOSIS — L598 Other specified disorders of the skin and subcutaneous tissue related to radiation: Secondary | ICD-10-CM | POA: Diagnosis not present

## 2024-02-23 DIAGNOSIS — S31000A Unspecified open wound of lower back and pelvis without penetration into retroperitoneum, initial encounter: Secondary | ICD-10-CM | POA: Insufficient documentation

## 2024-02-23 DIAGNOSIS — C20 Malignant neoplasm of rectum: Secondary | ICD-10-CM | POA: Diagnosis not present

## 2024-02-23 DIAGNOSIS — E11622 Type 2 diabetes mellitus with other skin ulcer: Secondary | ICD-10-CM | POA: Insufficient documentation

## 2024-02-23 DIAGNOSIS — Z933 Colostomy status: Secondary | ICD-10-CM | POA: Diagnosis not present

## 2024-02-23 LAB — GLUCOSE, CAPILLARY
Glucose-Capillary: 177 mg/dL — ABNORMAL HIGH (ref 70–99)
Glucose-Capillary: 183 mg/dL — ABNORMAL HIGH (ref 70–99)

## 2024-02-24 ENCOUNTER — Encounter (HOSPITAL_BASED_OUTPATIENT_CLINIC_OR_DEPARTMENT_OTHER): Admitting: Internal Medicine

## 2024-02-24 DIAGNOSIS — S31000A Unspecified open wound of lower back and pelvis without penetration into retroperitoneum, initial encounter: Secondary | ICD-10-CM | POA: Diagnosis not present

## 2024-02-24 DIAGNOSIS — C20 Malignant neoplasm of rectum: Secondary | ICD-10-CM | POA: Diagnosis not present

## 2024-02-24 DIAGNOSIS — W888XXA Exposure to other ionizing radiation, initial encounter: Secondary | ICD-10-CM | POA: Diagnosis not present

## 2024-02-24 DIAGNOSIS — E11622 Type 2 diabetes mellitus with other skin ulcer: Secondary | ICD-10-CM | POA: Diagnosis not present

## 2024-02-24 DIAGNOSIS — L598 Other specified disorders of the skin and subcutaneous tissue related to radiation: Secondary | ICD-10-CM

## 2024-02-24 DIAGNOSIS — Z933 Colostomy status: Secondary | ICD-10-CM | POA: Diagnosis not present

## 2024-02-24 DIAGNOSIS — Z9221 Personal history of antineoplastic chemotherapy: Secondary | ICD-10-CM | POA: Diagnosis not present

## 2024-02-24 LAB — GLUCOSE, CAPILLARY
Glucose-Capillary: 191 mg/dL — ABNORMAL HIGH (ref 70–99)
Glucose-Capillary: 185 mg/dL — ABNORMAL HIGH (ref 70–99)

## 2024-02-25 ENCOUNTER — Encounter (HOSPITAL_BASED_OUTPATIENT_CLINIC_OR_DEPARTMENT_OTHER): Admitting: Internal Medicine

## 2024-02-25 DIAGNOSIS — C20 Malignant neoplasm of rectum: Secondary | ICD-10-CM | POA: Diagnosis not present

## 2024-02-25 DIAGNOSIS — S31000A Unspecified open wound of lower back and pelvis without penetration into retroperitoneum, initial encounter: Secondary | ICD-10-CM | POA: Diagnosis not present

## 2024-02-25 DIAGNOSIS — L598 Other specified disorders of the skin and subcutaneous tissue related to radiation: Secondary | ICD-10-CM

## 2024-02-25 DIAGNOSIS — Z9221 Personal history of antineoplastic chemotherapy: Secondary | ICD-10-CM | POA: Diagnosis not present

## 2024-02-25 DIAGNOSIS — E11622 Type 2 diabetes mellitus with other skin ulcer: Secondary | ICD-10-CM | POA: Diagnosis not present

## 2024-02-25 DIAGNOSIS — W888XXA Exposure to other ionizing radiation, initial encounter: Secondary | ICD-10-CM | POA: Diagnosis not present

## 2024-02-25 DIAGNOSIS — Z933 Colostomy status: Secondary | ICD-10-CM | POA: Diagnosis not present

## 2024-02-25 LAB — GLUCOSE, CAPILLARY
Glucose-Capillary: 182 mg/dL — ABNORMAL HIGH (ref 70–99)
Glucose-Capillary: 189 mg/dL — ABNORMAL HIGH (ref 70–99)

## 2024-02-26 ENCOUNTER — Encounter (HOSPITAL_BASED_OUTPATIENT_CLINIC_OR_DEPARTMENT_OTHER): Admitting: Internal Medicine

## 2024-02-26 DIAGNOSIS — C20 Malignant neoplasm of rectum: Secondary | ICD-10-CM | POA: Diagnosis not present

## 2024-02-29 ENCOUNTER — Encounter (HOSPITAL_BASED_OUTPATIENT_CLINIC_OR_DEPARTMENT_OTHER): Attending: Internal Medicine | Admitting: Internal Medicine

## 2024-02-29 DIAGNOSIS — C20 Malignant neoplasm of rectum: Secondary | ICD-10-CM | POA: Diagnosis not present

## 2024-02-29 DIAGNOSIS — L598 Other specified disorders of the skin and subcutaneous tissue related to radiation: Secondary | ICD-10-CM | POA: Diagnosis not present

## 2024-02-29 DIAGNOSIS — S31000A Unspecified open wound of lower back and pelvis without penetration into retroperitoneum, initial encounter: Secondary | ICD-10-CM | POA: Insufficient documentation

## 2024-02-29 DIAGNOSIS — Z933 Colostomy status: Secondary | ICD-10-CM | POA: Diagnosis not present

## 2024-02-29 DIAGNOSIS — E11622 Type 2 diabetes mellitus with other skin ulcer: Secondary | ICD-10-CM | POA: Diagnosis not present

## 2024-02-29 LAB — GLUCOSE, CAPILLARY: Glucose-Capillary: 180 mg/dL — ABNORMAL HIGH (ref 70–99)

## 2024-03-01 ENCOUNTER — Encounter (HOSPITAL_BASED_OUTPATIENT_CLINIC_OR_DEPARTMENT_OTHER): Admitting: Internal Medicine

## 2024-03-01 ENCOUNTER — Other Ambulatory Visit: Payer: Self-pay | Admitting: Nurse Practitioner

## 2024-03-01 DIAGNOSIS — C20 Malignant neoplasm of rectum: Secondary | ICD-10-CM | POA: Diagnosis not present

## 2024-03-01 DIAGNOSIS — S31000A Unspecified open wound of lower back and pelvis without penetration into retroperitoneum, initial encounter: Secondary | ICD-10-CM | POA: Diagnosis not present

## 2024-03-01 DIAGNOSIS — L598 Other specified disorders of the skin and subcutaneous tissue related to radiation: Secondary | ICD-10-CM | POA: Diagnosis not present

## 2024-03-01 DIAGNOSIS — Z933 Colostomy status: Secondary | ICD-10-CM | POA: Diagnosis not present

## 2024-03-01 DIAGNOSIS — Z515 Encounter for palliative care: Secondary | ICD-10-CM

## 2024-03-01 DIAGNOSIS — G893 Neoplasm related pain (acute) (chronic): Secondary | ICD-10-CM

## 2024-03-01 DIAGNOSIS — E11622 Type 2 diabetes mellitus with other skin ulcer: Secondary | ICD-10-CM | POA: Diagnosis not present

## 2024-03-01 LAB — GLUCOSE, CAPILLARY
Glucose-Capillary: 200 mg/dL — ABNORMAL HIGH (ref 70–99)
Glucose-Capillary: 169 mg/dL — ABNORMAL HIGH (ref 70–99)

## 2024-03-01 MED ORDER — MORPHINE SULFATE ER 60 MG PO TBCR: 60.0000 mg | EXTENDED_RELEASE_TABLET | Freq: Two times a day (BID) | ORAL | 0 refills | Status: DC

## 2024-03-01 MED ORDER — MORPHINE SULFATE 15 MG PO TABS: 7.5000 mg | ORAL_TABLET | ORAL | 0 refills | Status: DC | PRN

## 2024-03-02 ENCOUNTER — Encounter (HOSPITAL_BASED_OUTPATIENT_CLINIC_OR_DEPARTMENT_OTHER): Admitting: Internal Medicine

## 2024-03-02 DIAGNOSIS — S31000A Unspecified open wound of lower back and pelvis without penetration into retroperitoneum, initial encounter: Secondary | ICD-10-CM | POA: Diagnosis not present

## 2024-03-02 DIAGNOSIS — L598 Other specified disorders of the skin and subcutaneous tissue related to radiation: Secondary | ICD-10-CM | POA: Diagnosis not present

## 2024-03-02 DIAGNOSIS — Z933 Colostomy status: Secondary | ICD-10-CM | POA: Diagnosis not present

## 2024-03-02 DIAGNOSIS — E11622 Type 2 diabetes mellitus with other skin ulcer: Secondary | ICD-10-CM | POA: Diagnosis not present

## 2024-03-02 DIAGNOSIS — C20 Malignant neoplasm of rectum: Secondary | ICD-10-CM | POA: Diagnosis not present

## 2024-03-02 LAB — GLUCOSE, CAPILLARY
Glucose-Capillary: 132 mg/dL — ABNORMAL HIGH (ref 70–99)
Glucose-Capillary: 141 mg/dL — ABNORMAL HIGH (ref 70–99)

## 2024-03-03 ENCOUNTER — Encounter (HOSPITAL_BASED_OUTPATIENT_CLINIC_OR_DEPARTMENT_OTHER): Admitting: Internal Medicine

## 2024-03-03 ENCOUNTER — Other Ambulatory Visit: Payer: Self-pay

## 2024-03-03 ENCOUNTER — Observation Stay (HOSPITAL_COMMUNITY)
Admission: EM | Admit: 2024-03-03 | Discharge: 2024-03-04 | Disposition: A | Discharge status: Home / Self Care | Attending: Internal Medicine | Admitting: Internal Medicine

## 2024-03-03 DIAGNOSIS — D649 Anemia, unspecified: Principal | ICD-10-CM | POA: Diagnosis present

## 2024-03-03 DIAGNOSIS — D6489 Other specified anemias: Secondary | ICD-10-CM | POA: Diagnosis not present

## 2024-03-03 DIAGNOSIS — F10920 Alcohol use, unspecified with intoxication, uncomplicated: Secondary | ICD-10-CM | POA: Insufficient documentation

## 2024-03-03 DIAGNOSIS — C20 Malignant neoplasm of rectum: Principal | ICD-10-CM | POA: Insufficient documentation

## 2024-03-03 DIAGNOSIS — Z794 Long term (current) use of insulin: Secondary | ICD-10-CM | POA: Diagnosis not present

## 2024-03-03 DIAGNOSIS — G8929 Other chronic pain: Secondary | ICD-10-CM | POA: Diagnosis not present

## 2024-03-03 DIAGNOSIS — L89159 Pressure ulcer of sacral region, unspecified stage: Secondary | ICD-10-CM | POA: Insufficient documentation

## 2024-03-03 DIAGNOSIS — Z86718 Personal history of other venous thrombosis and embolism: Secondary | ICD-10-CM | POA: Diagnosis not present

## 2024-03-03 DIAGNOSIS — R000 Tachycardia, unspecified: Secondary | ICD-10-CM | POA: Diagnosis not present

## 2024-03-03 DIAGNOSIS — S31000A Unspecified open wound of lower back and pelvis without penetration into retroperitoneum, initial encounter: Secondary | ICD-10-CM

## 2024-03-03 DIAGNOSIS — E119 Type 2 diabetes mellitus without complications: Secondary | ICD-10-CM | POA: Insufficient documentation

## 2024-03-03 LAB — CBC WITH DIFFERENTIAL/PLATELET
Abs Immature Granulocytes: 0.01 10*3/uL (ref 0.00–0.07)
Basophils Absolute: 0 10*3/uL (ref 0.0–0.1)
Basophils Relative: 0 %
Eosinophils Absolute: 0.2 10*3/uL (ref 0.0–0.5)
Eosinophils Relative: 4 %
HCT: 20.6 % — ABNORMAL LOW (ref 39.0–52.0)
Hemoglobin: 5.9 g/dL — CL (ref 13.0–17.0)
Immature Granulocytes: 0 %
Lymphocytes Relative: 33 %
Lymphs Abs: 1.8 10*3/uL (ref 0.7–4.0)
MCH: 23.1 pg — ABNORMAL LOW (ref 26.0–34.0)
MCHC: 28.6 g/dL — ABNORMAL LOW (ref 30.0–36.0)
MCV: 80.8 fL (ref 80.0–100.0)
Monocytes Absolute: 0.4 10*3/uL (ref 0.1–1.0)
Monocytes Relative: 6 %
Neutro Abs: 3.1 10*3/uL (ref 1.7–7.7)
Neutrophils Relative %: 57 %
Platelets: 318 10*3/uL (ref 150–400)
RBC: 2.55 MIL/uL — ABNORMAL LOW (ref 4.22–5.81)
RDW: 18.6 % — ABNORMAL HIGH (ref 11.5–15.5)
WBC: 5.5 10*3/uL (ref 4.0–10.5)
nRBC: 0 % (ref 0.0–0.2)

## 2024-03-03 LAB — CBG MONITORING, ED: Glucose-Capillary: 226 mg/dL — ABNORMAL HIGH (ref 70–99)

## 2024-03-03 LAB — COMPREHENSIVE METABOLIC PANEL WITH GFR
ALT: 15 U/L (ref 0–44)
AST: 14 U/L — ABNORMAL LOW (ref 15–41)
Albumin: 2.1 g/dL — ABNORMAL LOW (ref 3.5–5.0)
Alkaline Phosphatase: 216 U/L — ABNORMAL HIGH (ref 38–126)
Anion gap: 7 (ref 5–15)
BUN: 35 mg/dL — ABNORMAL HIGH (ref 6–20)
CO2: 25 mmol/L (ref 22–32)
Calcium: 8.4 mg/dL — ABNORMAL LOW (ref 8.9–10.3)
Chloride: 104 mmol/L (ref 98–111)
Creatinine, Ser: 0.6 mg/dL — ABNORMAL LOW (ref 0.61–1.24)
GFR, Estimated: >60 mL/min (ref >60)
Glucose, Bld: 197 mg/dL — ABNORMAL HIGH (ref 70–99)
Potassium: 4.3 mmol/L (ref 3.5–5.1)
Sodium: 136 mmol/L (ref 135–145)
Total Bilirubin: 0.6 mg/dL (ref 0.0–1.2)
Total Protein: 5.9 g/dL — ABNORMAL LOW (ref 6.5–8.1)

## 2024-03-03 LAB — I-STAT CG4 LACTIC ACID, ED: Lactic Acid, Venous: 0.6 mmol/L (ref 0.5–1.9)

## 2024-03-03 LAB — GLUCOSE, CAPILLARY: Glucose-Capillary: 203 mg/dL — ABNORMAL HIGH (ref 70–99)

## 2024-03-03 LAB — PREPARE RBC (CROSSMATCH)

## 2024-03-03 MED ORDER — ALPRAZOLAM 0.25 MG PO TABS: 0.2500 mg | ORAL_TABLET | Freq: Two times a day (BID) | ORAL | Status: DC | PRN

## 2024-03-03 MED ORDER — MORPHINE SULFATE 15 MG PO TABS
7.5000 mg | ORAL_TABLET | ORAL | Status: DC | PRN
  Administered 2024-03-03 – 2024-03-04 (×3): 15 mg via ORAL
  Filled 2024-03-03 (×3): qty 1

## 2024-03-03 MED ORDER — SODIUM CHLORIDE 0.9% IV SOLUTION
Freq: Once | INTRAVENOUS | Status: AC
Start: 2024-03-03 — End: 2024-03-04

## 2024-03-03 MED ORDER — MORPHINE SULFATE ER 15 MG PO TBCR: 60.0000 mg | EXTENDED_RELEASE_TABLET | Freq: Two times a day (BID) | ORAL | Status: DC

## 2024-03-03 MED ORDER — INSULIN ASPART 100 UNIT/ML IJ SOLN
0.0000 [IU] | Freq: Three times a day (TID) | INTRAMUSCULAR | Status: DC
  Administered 2024-03-03: 5 [IU] via SUBCUTANEOUS
  Administered 2024-03-04: 2 [IU] via SUBCUTANEOUS
  Filled 2024-03-03: qty 0.15

## 2024-03-03 MED ORDER — INSULIN ASPART 100 UNIT/ML IJ SOLN
0.0000 [IU] | Freq: Every day | INTRAMUSCULAR | Status: DC
  Administered 2024-03-03: 2 [IU] via SUBCUTANEOUS
  Filled 2024-03-03: qty 0.05

## 2024-03-03 MED ORDER — MORPHINE SULFATE ER 15 MG PO TBCR
60.0000 mg | EXTENDED_RELEASE_TABLET | Freq: Two times a day (BID) | ORAL | Status: DC
  Administered 2024-03-03: 60 mg via ORAL
  Filled 2024-03-03: qty 4

## 2024-03-03 MED ORDER — RIVAROXABAN 20 MG PO TABS
20.0000 mg | ORAL_TABLET | Freq: Every day | ORAL | Status: DC
  Administered 2024-03-04: 20 mg via ORAL
  Filled 2024-03-03: qty 1

## 2024-03-03 MED ORDER — DOCUSATE SODIUM 100 MG PO CAPS
100.0000 mg | ORAL_CAPSULE | Freq: Two times a day (BID) | ORAL | Status: DC
  Filled 2024-03-03: qty 1

## 2024-03-03 MED ORDER — ONDANSETRON HCL 4 MG PO TABS: 4.0000 mg | ORAL_TABLET | Freq: Four times a day (QID) | ORAL | Status: DC | PRN

## 2024-03-03 MED ORDER — ALPRAZOLAM 0.5 MG PO TABS
0.5000 mg | ORAL_TABLET | Freq: Once | ORAL | Status: AC
  Administered 2024-03-03: 0.5 mg via ORAL
  Filled 2024-03-03: qty 1

## 2024-03-03 MED ORDER — CYCLOBENZAPRINE HCL 10 MG PO TABS
10.0000 mg | ORAL_TABLET | ORAL | Status: DC
  Administered 2024-03-03 – 2024-03-04 (×3): 10 mg via ORAL
  Filled 2024-03-03 (×4): qty 1

## 2024-03-03 MED ORDER — FERROUS SULFATE 325 (65 FE) MG PO TABS
325.0000 mg | ORAL_TABLET | Freq: Every day | ORAL | Status: DC
  Administered 2024-03-04: 325 mg via ORAL
  Filled 2024-03-03: qty 1

## 2024-03-03 MED ORDER — ACETAMINOPHEN 325 MG PO TABS: 650.0000 mg | ORAL_TABLET | Freq: Four times a day (QID) | ORAL | Status: DC | PRN

## 2024-03-03 MED ORDER — ACETAMINOPHEN 650 MG RE SUPP: 650.0000 mg | Freq: Four times a day (QID) | RECTAL | Status: DC | PRN

## 2024-03-03 MED ORDER — DEXTROSE-SODIUM CHLORIDE 5-0.9 % IV SOLN: INTRAVENOUS | Status: DC

## 2024-03-03 MED ORDER — ONDANSETRON HCL 4 MG/2ML IJ SOLN: 4.0000 mg | Freq: Four times a day (QID) | INTRAMUSCULAR | Status: DC | PRN

## 2024-03-03 MED ORDER — ESCITALOPRAM OXALATE 10 MG PO TABS
10.0000 mg | ORAL_TABLET | Freq: Every day | ORAL | Status: DC
  Filled 2024-03-03: qty 1

## 2024-03-03 MED ORDER — MORPHINE SULFATE 15 MG PO TABS
15.0000 mg | ORAL_TABLET | Freq: Once | ORAL | Status: AC
  Administered 2024-03-03: 15 mg via ORAL
  Filled 2024-03-03: qty 1

## 2024-03-03 MED ORDER — ACETAMINOPHEN 500 MG PO TABS
1000.0000 mg | ORAL_TABLET | Freq: Once | ORAL | Status: AC
  Administered 2024-03-03: 1000 mg via ORAL
  Filled 2024-03-03: qty 2

## 2024-03-03 MED ORDER — MORPHINE SULFATE ER 15 MG PO TBCR
60.0000 mg | EXTENDED_RELEASE_TABLET | Freq: Two times a day (BID) | ORAL | Status: DC
  Administered 2024-03-04: 60 mg via ORAL
  Filled 2024-03-03: qty 4

## 2024-03-03 MED ORDER — ALBUTEROL SULFATE (2.5 MG/3ML) 0.083% IN NEBU: 2.5000 mg | INHALATION_SOLUTION | RESPIRATORY_TRACT | Status: DC | PRN

## 2024-03-03 NOTE — ED Notes (Signed)
 Assisted provider in turning pt to assess sacral wound. Redressed wound with two abd pads and tape

## 2024-03-03 NOTE — ED Notes (Addendum)
 Pt in bed in nad, notified of hgb

## 2024-03-03 NOTE — ED Notes (Signed)
 Patient is resting comfortably.

## 2024-03-03 NOTE — ED Notes (Signed)
 Called ICU to get some caps to cover hub for pts picc line

## 2024-03-03 NOTE — H&P (Signed)
 History and Physical  Kimi Kroft ZOX:[id number] DOB: 1971/09/15 DOA: 03/03/2024  PCP: [name], MD   Chief Complaint: Anemia  HPI: Broc Caspers is a chronically ill 53 y.o. male with medical history significant for rectal cancer status post low anterior resection with loop ileostomy with complications including anastomotic leak, end colostomy, hematoma, etc. as well as history of DVT/PE on Xarelto  and diabetes with recent prolonged stay at [facility] now being admitted for acute on chronic anemia.  He has been on TPN since his last hospital stay, but taking some food by mouth.  Due to the TPN, he has routine lab work at home, was found to have a hemoglobin 5.9 and told to come to the ER.  Looks like his most recent hemoglobin at [facility] ranged from 8.6-7.5.  Patient and his wife deny any significant bleeding, she has been doing his dressing changes on his perineal wound and it has been healing quite well.  Review of Systems: Please see HPI for pertinent positives and negatives. A complete 10 system review of systems are otherwise negative.  Past Medical History:  Diagnosis Date   Anemia 02/2021   iron    Anxiety    Asthma    Bilateral pulmonary embolism (HCC) 04/24/2019   Dehiscence of anastomosis of large intestine 12/31/2022   Depression    Diabetes mellitus without complication (HCC)    History of kidney stones    HLD (hyperlipidemia)    Hypertension    Kidney stones    Neuromuscular disorder (HCC)    from radiation bi lat   OSA (obstructive sleep apnea)    CPAP   Peripheral vascular disease (HCC)    DVT   rectal ca 02/2021   Sleep apnea    Past Surgical History:  Procedure Laterality Date   APPENDECTOMY  1989   open appy   APPLICATION OF WOUND VAC N/A 12/30/2023   Procedure: APPLICATION, WOUND VAC;  Surgeon: Oralee Billow, MD;  Location: [facility];  Service: General;  Laterality: N/A;   BLADDER REPAIR  01/07/2024   Procedure: Closure of bladder cystotomy/fistula;  Surgeon:  Roxane Copp, MD;  Location: [facility];  Service: Urology;;   COLONOSCOPY  03/05/2021   CYST EXCISION  2011   left neck   DIVERTING ILEOSTOMY N/A 07/17/2021   Procedure: DIVERTING ILEOSTOMY;  Surgeon: Joyce Nixon, MD;  Location: [facility];  Service: General;  Laterality: N/A;   FLEXIBLE SIGMOIDOSCOPY N/A 04/04/2022   Procedure: FLEXIBLE SIGMOIDOSCOPY WITH POSSIBLE DEBRIDEMENT;  Surgeon: Joyce Nixon, MD;  Location[facility];  Service: General;  Laterality: N/A;   INCISION AND DRAINAGE OF WOUND N/A 01/04/2024   Procedure: IRRIGATION AND DEBRIDEMENT WOUND; WOUND VAC REPLACEMENT;  Surgeon: Shela Derby, MD;  Location: [facility];  Service: General;  Laterality: N/A;   INCISION AND DRAINAGE PERIRECTAL ABSCESS N/A 12/16/2023   Procedure: DEBRIDEMENT OF POST OP WOUND;  Surgeon: Joyce Nixon, MD;  Location: [facility];  Service: General;  Laterality: N/A;  I & D PERIONEAL WOUND   IRRIGATION AND DEBRIDEMENT ABSCESS N/A 12/30/2023   Procedure: IRRIGATION AND DEBRIDEMENT ABSCESS;  Surgeon: Oralee Billow, MD;  Location: [facility];  Service: General;  Laterality: N/A;   IRRIGATION AND DEBRIDEMENT ABSCESS N/A 01/01/2024   Procedure: IRRIGATION AND DEBRIDEMENT ABSCESS;  Surgeon: Oralee Billow, MD;  Location: [facility];  Service: General;  Laterality: N/A;   IRRIGATION AND DEBRIDEMENT ABSCESS N/A 01/07/2024   Procedure: IRRIGATION AND DEBRIDEMENT ABSCESS (N/A) - WOUND EXPLORATION AND DEBRIDEMENT.  CHANGE WOUND  VAC DRESSING.  IRRIGATION AND DEBRIDEMENT OF ABDOMINAL WALL ABSCESS.;  Surgeon: Shela Derby, MD;  Location: [facility];  Service: General;  Laterality: N/A;   IRRIGATION AND DEBRIDEMENT ABSCESS N/A 01/11/2024   Procedure: EUA, WOUND VAC CHANGE, EXCISIONAL DEBRIDEMENT OF TISSUE,  MUSCLE AND PERINIEUM.;  Surgeon: Aldean Hummingbird, MD;  Location: [facility];  Service: General;  Laterality: N/A;   RECTAL EXAM UNDER ANESTHESIA N/A 04/04/2022   Procedure: ANAL EXAM UNDER ANESTHESIA;  Surgeon: Joyce Nixon, MD;   Location[facility];  Service: General;  Laterality: N/A;   RECTAL EXAM UNDER ANESTHESIA N/A 12/25/2023   Procedure: EXAM UNDER ANESTHESIA, RECTUM;  Surgeon: Sim Dryer, MD;  Location[facility];  Service: General;  Laterality: N/A;   RECTOPEXY N/A 12/02/2023   Procedure: Perineal Proctatectomy;  Surgeon: Joyce Nixon, MD;  Location[facility];  Service: General;  Laterality: N/A;  Completion Proctatectomy   WISDOM TOOTH EXTRACTION     age 66   XI ROBOTIC ASSISTED LOWER ANTERIOR RESECTION N/A 07/17/2021   Procedure: XI ROBOTIC ASSISTED LOWER ANTERIOR RESECTION;  Surgeon: Joyce Nixon, MD;  Location: [facility];  Service: General;  Laterality: N/A;   XI ROBOTIC ASSISTED LOWER ANTERIOR RESECTION N/A 12/31/2022   Procedure: ROBOTIC ASSISTED LOWER ANTERIOR RESECTION WITH OSTOMY with intraop ICG guided perfusion, takedown of iliostomy;  Surgeon: Joyce Nixon, MD;  Location: [facility];  Service: General;  Laterality: N/A;   Social History:  reports that he has never smoked. He has never used smokeless tobacco. He reports current alcohol use. He reports that he does not use drugs.  Allergies  Allergen Reactions   Contrast Media [Iodinated Contrast Media] Hives, Nausea Only and Other (See Comments)    As a 53 year old, experienced hives and nausea  MUST HAVE BENADRYL  AND PREDNISONE  BEFOREHAND    Family History  Problem Relation Age of Onset   Diabetes Mellitus II Mother    Breast cancer Mother 22   Thrombophlebitis Father    Breast cancer Maternal Grandmother 73   Diabetes Maternal Grandmother    Breast cancer Maternal Aunt 60   Diabetes Maternal Aunt    Hypertension Paternal Grandmother    Colon cancer Neg Hx    Esophageal cancer Neg Hx    Inflammatory bowel disease Neg Hx    Liver disease Neg Hx    Pancreatic cancer Neg Hx    Rectal cancer Neg Hx    Stomach cancer Neg Hx      Prior to Admission medications   Medication Sig Start Date End Date Taking? Authorizing  Provider  albuterol  (PROAIR  HFA) 108 (90 Base) MCG/ACT inhaler Inhale 2 puffs into the lungs every 4 (four) hours as needed for wheezing or shortness of breath. 08/30/10  Yes [provider]  ALPRAZolam  (XANAX ) 0.5 MG tablet Take 0.5 mg by mouth 2 (two) times daily as needed for anxiety or sleep. 02/09/24  Yes [provider]  calcium  carbonate (TUMS - DOSED IN MG ELEMENTAL CALCIUM ) 500 MG chewable tablet Chew 2 tablets by mouth daily as needed for indigestion or heartburn.   Yes [provider]  diphenhydrAMINE  (BENADRYL ) 25 mg capsule Take 1 capsule (25 mg total) by mouth every 6 (six) hours as needed for itching. Patient taking differently: Take 25 mg by mouth every 6 (six) hours as needed (with all contrast dye, plus prednisone ). 01/13/24  Yes Joyce Nixon, MD  PRESCRIPTION MEDICATION See admin instructions. TPN from [facility]- Beginning at 8-9 PM for twelve hours  Yes [provider]  rivaroxaban  (XARELTO ) 20 MG TABS tablet Take 1 tablet (20 mg total) by mouth daily with supper. Patient taking differently: Take 20 mg by mouth in the morning. 04/13/23  Yes Iruku, Praveena, MD  Simethicone  (GAS-X PO) Take 1-2 tablets by mouth as needed (gas).   Yes [provider]  tetrahydrozoline-zinc  (VISINE-AC) 0.05-0.25 % ophthalmic solution Place 2 drops into both eyes daily as needed (allergy).   Yes [provider]  TYLENOL  500 MG tablet Take 500-1,000 mg by mouth See admin instructions.   Yes [provider]  acetaminophen  (TYLENOL ) 500 MG tablet Take 1,000 mg by mouth every 6 (six) hours as needed for mild pain.    [provider]  cyclobenzaprine  (FLEXERIL ) 10 MG tablet Take 1 tablet (10 mg total) by mouth 3 (three) times daily as needed for muscle spasms. Patient taking differently: Take 10 mg by mouth 3 (three) times daily as needed for muscle spasms. Take 10 mg by mouth at 12 Midnight, 8 AM, and 4 PM 02/11/24  Yes  Pickenpack-Cousar, Athena N, NP  diclofenac  Sodium (VOLTAREN ) 1 % GEL Apply 2 g topically every 6 (six) hours as needed (pain in leg). Patient not taking: Reported on 03/03/2024 01/13/24   Joyce Nixon, MD  enoxaparin  (LOVENOX ) 30 MG/0.3ML injection Inject 0.3 mLs (30 mg total) into the skin every 12 (twelve) hours. Patient not taking: Reported on 03/03/2024 01/13/24   Joyce Nixon, MD  escitalopram  (LEXAPRO ) 10 MG tablet Take 1 tablet (10 mg total) by mouth at bedtime. Patient taking differently: Take 10 mg by mouth daily at 6 PM. 02/11/24  Yes Pickenpack-Cousar, Giles Labrum, NP  ferrous sulfate  325 (65 FE) MG tablet Take 1 tablet (325 mg total) by mouth 2 (two) times daily with a meal. Patient not taking: Reported on 03/03/2024 01/14/24   Joyce Nixon, MD  Gauze Pads & Dressings (COMBINE ABD) 5"X9" PADS 1 Units by Does not apply route daily. 12/17/23   Joyce Nixon, MD  Gauze Pads & Dressings [facility] GAUZE ROLL LARGE) MISC 1 Units by Does not apply route daily. 12/17/23   Joyce Nixon, MD  magic mouthwash w/lidocaine  SOLN Take 10 mLs by mouth 3 (three) times daily. Suspension contains equal amounts of Maalox Extra Strength, nystatin , diphenhydramine  and lidocaine . Patient not taking: Reported on 03/03/2024 01/13/24   Joyce Nixon, MD  morphine  (MS CONTIN ) 60 MG 12 hr tablet Take 1 tablet (60 mg total) by mouth every 12 (twelve) hours. 03/11/24   Pickenpack-Cousar, Giles Labrum, NP  morphine  (MSIR) 15 MG tablet Take 0.5-1 tablets (7.5-15 mg total) by mouth every 4 (four) hours as needed for moderate pain (pain score 4-6) or severe pain (pain score 7-10). Patient taking differently: Take 15 mg by mouth See admin instructions. 03/01/24   Pickenpack-Cousar, Athena N, NP  naphazoline-glycerin  (CLEAR EYES REDNESS) 0.012-0.25 % SOLN Place 1-2 drops into both eyes 4 (four) times daily as needed for eye irritation. Patient not taking: Reported on 03/03/2024 01/13/24   Joyce Nixon, MD  nystatin  (MYCOSTATIN /NYSTOP )  powder Apply topically 2 (two) times daily. Patient not taking: Reported on 03/03/2024 01/13/24   Joyce Nixon, MD  piperacillin -tazobactam (ZOSYN ) 3.375 GM/50ML IVPB Inject 50 mLs (3.375 g total) into the vein every 8 (eight) hours. Patient not taking: Reported on 03/03/2024 01/13/24   Joyce Nixon, MD  polycarbophil (FIBERCON) 625 MG tablet Take 1 tablet (625 mg total) by mouth 2 (two) times daily. Patient not taking: Reported on 03/03/2024 01/13/24   Joyce Nixon,  MD  zinc  sulfate, 50mg  elemental zinc , 220 (50 Zn) MG capsule Take 1 capsule (220 mg total) by mouth daily. Patient not taking: Reported on 03/03/2024 01/14/24   Joyce Nixon, MD    Physical Exam: BP 113/71   Pulse 92   Temp 98.8 F (37.1 C) (Oral)   Resp (!) 6   SpO2 90%  General:  Alert, oriented, calm, in no acute distress, looks chronically ill, looks quite pale.  His wife is at the bedside. Cardiovascular: RRR, no murmurs or rubs, mild lower extremity nonpitting edema Respiratory: clear to auscultation bilaterally, no wheezes, no crackles  Abdomen: soft, nontender, ostomy in place with soft stool Skin: dry, no rashes, perineal wound not examined Musculoskeletal: no joint effusions, normal range of motion, left upper extremity PICC line Psychiatric: appropriate affect, normal speech  Neurologic: extraocular muscles intact, clear speech, moving all extremities with intact sensorium         Labs on Admission:  Basic Metabolic Panel: Recent Labs  Lab 03/03/24 1018  NA 136  K 4.3  CL 104  CO2 25  GLUCOSE 197*  BUN 35*  CREATININE 0.60*  CALCIUM  8.4*   Liver Function Tests: Recent Labs  Lab 03/03/24 1018  AST 14*  ALT 15  ALKPHOS 216*  BILITOT 0.6  PROT 5.9*  ALBUMIN  2.1*   No results for input(s): "LIPASE", "AMYLASE" in the last 168 hours. No results for input(s): "AMMONIA" in the last 168 hours. CBC: Recent Labs  Lab 03/03/24 1018  WBC 5.5  NEUTROABS 3.1  HGB 5.9*  HCT 20.6*  MCV 80.8  PLT 318    Cardiac Enzymes: No results for input(s): "CKTOTAL", "CKMB", "CKMBINDEX", "TROPONINI" in the last 168 hours. BNP (last 3 results) No results for input(s): "BNP" in the last 8760 hours.  ProBNP (last 3 results) No results for input(s): "PROBNP" in the last 8760 hours.  CBG: Recent Labs  Lab 02/29/24 1212 03/01/24 0931 03/01/24 1204 03/02/24 1000 03/02/24 1220  GLUCAP 180* 200* 169* 132* 141*    Radiological Exams on Admission: No results found.  Assessment/Plan Shaunte Weissinger is a chronically ill 53 y.o. male with medical history significant for rectal cancer status post low anterior resection with loop ileostomy with complications including anastomotic leak, end colostomy, hematoma, etc. as well as history of DVT/PE on Xarelto  and diabetes with recent prolonged stay at [facility] now being admitted for acute on chronic anemia.   Acute on chronic anemia-unclear etiology, but could be postoperative.  He has no evidence of active bleeding.  Not really symptomatic, but that is likely because he is chronically after his recent hospital stay. -Observation admission -Transfused 2 unit PRBC -Check posttransfusion hemoglobin in the morning  Complex surgical history including low anterior resection, transition to and ostomy, oversewing of the rectum as well as hematoma and pelvic abscesses with large perineal wound.  This is reportedly now healing well.  Patient was seen by general surgery in the ER, to ensure no active surgical issues.  If surgical complications arise, he will likely need to be transferred back to [facility], but at this time this does not seem to be present.  Type 2 diabetes-hold p.o. meds, carb modified diet with sliding scale insulin   Chronic pain-continue MS Contin   History of DVT and PE-continue Xarelto     Code Status: Full Code  Consults called: General Surgery  Admission status: Observation  Time spent: 53 minutes  Kashawn Dirr Rickey Charm MD [facility] Pager  [phone]  If 7PM-7AM, please contact night-coverage  [email or web address] Password [email or web address]  03/03/2024, 3:50 PM

## 2024-03-03 NOTE — ED Notes (Signed)
 Given fan for comfort. Pt in nad at this time

## 2024-03-03 NOTE — Consult Note (Signed)
 Steven [name] 09-22-1971  [id number].    Requesting MD: Dr. Rafael Bun Chief Complaint/Reason for Consult: Anemia in the setting of complex surgical history.   HPI: Steven [name] is a 53 y.o. male who is known to our service and presented with acute on chronic anemia.   Brief history based on discharge summary 01/13/24 Pt with h/o DVT/PE, rectal cancer and DM who is s/p ChemoRT and LAR with loop ileostomy in Oct 2022. This was complicated by anastomotic leak that failed to heal. This lead to conversion of LAR to end colostomy with closure of loop ileostomy and oversew of anal canal. He then developed a post op hematoma that became infected and would not heal with pelvic drain. Eventually his anal canal opened and he began to chronically drain from this. He then presented to the ED with worsening pelvic abscess and SBO in March 2025. It was decided to perform a completion proctectomy and remove his anal canal to facilitate drainage and allow area to heal enough for flap placement. Since that time he has continued to develop necrosis of the perineal tissues and pelvic structures, requiring frequent debridements. In early April he developed signs of sepsis and was noted to have necrosis spreading to gluteus muscles. There was also concern for small bowel fistula on CT. He was made NPO and placed on TPN. We switched to wound vac placements with changes in OR twice a weak. His small bowel appeared to heal after a couple weeks and his diet was advanced to liquids but left on TPN. On one of his last vac changes, his foley was noted to exposed. Urology was called and the area was oversewn with plans to leave foley indefinitely. At this point, it was decided that the patient would need a higher level of care than what could be provided. In discussions with in house plastic surgeons, they felt that this was out of their scope of practice and recommended transfer to tertiary facility.   Per outside  hospital notes  It appears after transfer there was concerns for gallbladder necrosis on imaging for which she was treated with antibiotics and a percutaneous cholecystostomy tube on 01/16/2024.  Given imaging concerns for an enteric fistula to the perineal wound he was made n.p.o. and started on TPN.  He was taken to the OR 01/21/2024 with Drs. Thacker and Mantyh for EUA and wound debridement and Dr. Milon Aloe for suprapubic tube placement. Cultures from the OR grew 2+ mixed gram positive and gram negative organisms and yeast and he completed a course of IV fluconazole and zosyn . He was treated with hyperbaric oxygen  therapy.  Percutaneous cholecystostomy tube was dislodged 02/02/2024.  Replacement was deferred.  He was discharged on 02/05/2024.  Patient underwent most recent procedure by [facility], Dr. Onita Biles, on 02/15/2024 for EUA with findings of  "Mr. Woodbeck's wound looks so much better. There is granulation tissue over all exposed bone and muscle. His wound is smaller in dimensions, too, measuring about 14 cm diameter. It is still deep into the pelvis about 10 cm, but this track has narrowed to about 4 cm in width. There was no enteric output on the dressing that was removed or on interrogation of the wound."  Patient underwent outpatient labs yesterday that showed hemoglobin of 5.8.  Repeat hemoglobin today 5.9.  Baseline appears to be 7-8. He is on Xarelto . On last vitals here he is afebrile without tachycardia or hypotension.   General surgery was asked to see given  patient's complex surgical history.  A combination of patient and his wife at bedside help provide history. They report labs were drawn routinely as patient is on TPN. They report he is still on TPN but did start eating again on 5/19 after reassuring EUA. He is tolerating diet at home without abdominal pain, n/v (specifically no hematemesis). He continues to have ostomy output that is non-bloody and does not appear dark or tarry. His wife has  been changing his perineal wound BID with WTD dressing changes. They have noted some oozing after removing the dressing but no brisk bleeding and actually less bleeding from the wound with dressing changes over the last several days. No gross hematuria via SP tube.    ROS: ROS As above, see hpi  Family History  Problem Relation Age of Onset   Diabetes Mellitus II Mother    Breast cancer Mother 67   Thrombophlebitis Father    Breast cancer Maternal Grandmother 6   Diabetes Maternal Grandmother    Breast cancer Maternal Aunt 60   Diabetes Maternal Aunt    Hypertension Paternal Grandmother    Colon cancer Neg Hx    Esophageal cancer Neg Hx    Inflammatory bowel disease Neg Hx    Liver disease Neg Hx    Pancreatic cancer Neg Hx    Rectal cancer Neg Hx    Stomach cancer Neg Hx     Past Medical History:  Diagnosis Date   Anemia 02/2021   iron    Anxiety    Asthma    Bilateral pulmonary embolism (HCC) 04/24/2019   Dehiscence of anastomosis of large intestine 12/31/2022   Depression    Diabetes mellitus without complication (HCC)    History of kidney stones    HLD (hyperlipidemia)    Hypertension    Kidney stones    Neuromuscular disorder (HCC)    from radiation bi lat   OSA (obstructive sleep apnea)    CPAP   Peripheral vascular disease (HCC)    DVT   rectal ca 02/2021   Sleep apnea     Past Surgical History:  Procedure Laterality Date   APPENDECTOMY  1989   open appy   APPLICATION OF WOUND VAC N/A 12/30/2023   Procedure: APPLICATION, WOUND VAC;  Surgeon: Oralee Billow, MD;  Location: [facility];  Service: General;  Laterality: N/A;   BLADDER REPAIR  01/07/2024   Procedure: Closure of bladder cystotomy/fistula;  Surgeon: Roxane Copp, MD;  Location: [facility];  Service: Urology;;   COLONOSCOPY  03/05/2021   CYST EXCISION  2011   left neck   DIVERTING ILEOSTOMY N/A 07/17/2021   Procedure: DIVERTING ILEOSTOMY;  Surgeon: Joyce Nixon, MD;  Location: [facility];  Service:  General;  Laterality: N/A;   FLEXIBLE SIGMOIDOSCOPY N/A 04/04/2022   Procedure: FLEXIBLE SIGMOIDOSCOPY WITH POSSIBLE DEBRIDEMENT;  Surgeon: Joyce Nixon, MD;  Location: [facility];  Service: General;  Laterality: N/A;   INCISION AND DRAINAGE OF WOUND N/A 01/04/2024   Procedure: IRRIGATION AND DEBRIDEMENT WOUND; WOUND VAC REPLACEMENT;  Surgeon: Shela Derby, MD;  Location: [facility];  Service: General;  Laterality: N/A;   INCISION AND DRAINAGE PERIRECTAL ABSCESS N/A 12/16/2023   Procedure: DEBRIDEMENT OF POST OP WOUND;  Surgeon: Joyce Nixon, MD;  Location: [facility];  Service: General;  Laterality: N/A;  I & D PERIONEAL WOUND   IRRIGATION AND DEBRIDEMENT ABSCESS N/A 12/30/2023   Procedure: IRRIGATION AND DEBRIDEMENT ABSCESS;  Surgeon: Oralee Billow, MD;  Location: [facility];  Service:  General;  Laterality: N/A;   IRRIGATION AND DEBRIDEMENT ABSCESS N/A 01/01/2024   Procedure: IRRIGATION AND DEBRIDEMENT ABSCESS;  Surgeon: Oralee Billow, MD;  Location: [facility];  Service: General;  Laterality: N/A;   IRRIGATION AND DEBRIDEMENT ABSCESS N/A 01/07/2024   Procedure: IRRIGATION AND DEBRIDEMENT ABSCESS (N/A) - WOUND EXPLORATION AND DEBRIDEMENT.  CHANGE WOUND VAC DRESSING.  IRRIGATION AND DEBRIDEMENT OF ABDOMINAL WALL ABSCESS.;  Surgeon: Shela Derby, MD;  Location: [facility];  Service: General;  Laterality: N/A;   IRRIGATION AND DEBRIDEMENT ABSCESS N/A 01/11/2024   Procedure: EUA, WOUND VAC CHANGE, EXCISIONAL DEBRIDEMENT OF TISSUE,  MUSCLE AND PERINIEUM.;  Surgeon: Aldean Hummingbird, MD;  Location: [facility];  Service: General;  Laterality: N/A;   RECTAL EXAM UNDER ANESTHESIA N/A 04/04/2022   Procedure: ANAL EXAM UNDER ANESTHESIA;  Surgeon: Joyce Nixon, MD;  Location: [facility] [place];  Service: General;  Laterality: N/A;   RECTAL EXAM UNDER ANESTHESIA N/A 12/25/2023   Procedure: EXAM UNDER ANESTHESIA, RECTUM;  Surgeon: Sim Dryer, MD;  Location: [facility];  Service: General;  Laterality: N/A;    RECTOPEXY N/A 12/02/2023   Procedure: Perineal Proctatectomy;  Surgeon: Joyce Nixon, MD;  Location: [facility];  Service: General;  Laterality: N/A;  Completion Proctatectomy   WISDOM TOOTH EXTRACTION     age 57   XI ROBOTIC ASSISTED LOWER ANTERIOR RESECTION N/A 07/17/2021   Procedure: XI ROBOTIC ASSISTED LOWER ANTERIOR RESECTION;  Surgeon: Joyce Nixon, MD;  Location: [facility];  Service: General;  Laterality: N/A;   XI ROBOTIC ASSISTED LOWER ANTERIOR RESECTION N/A 12/31/2022   Procedure: ROBOTIC ASSISTED LOWER ANTERIOR RESECTION WITH OSTOMY with intraop ICG guided perfusion, takedown of iliostomy;  Surgeon: Joyce Nixon, MD;  Location: [facility];  Service: General;  Laterality: N/A;    Social History:  reports that he has never smoked. He has never used smokeless tobacco. He reports current alcohol use. He reports that he does not use drugs.  Allergies:  Allergies  Allergen Reactions   Contrast Media [Iodinated Contrast Media] Hives and Nausea Only    As a 53 year old experienced hives and nausea    (Not in a hospital admission)    Physical Exam: Blood pressure 125/78, pulse 97, temperature 98.3 F (36.8 C), temperature source Oral, resp. rate 11, SpO2 95%. General: pleasant, WD/WN male who is laying in bed in NAD Heart: regular rate  Lungs:  Respiratory effort nonlabored Abd: Soft, ND, NT, ostomy bag with soft stool without obvious blood or melena. SP tube with straw colored urine without gross hematuria.  GU: Perineal wound with mostly healthy granulation tissue at the base with small amount of oozing controlled with pressure but no pulsatile or brisk bleeding noted.  Skin: warm and dry   Results for orders placed or performed during the hospital encounter of 03/03/24 (from the past 48 hours)  Comprehensive metabolic panel     Status: Abnormal   Collection Time: 03/03/24 10:18 AM  Result Value Ref Range   Sodium 136 135 - 145 mmol/L   Potassium 4.3 3.5 - 5.1 mmol/L   Chloride  104 98 - 111 mmol/L   CO2 25 22 - 32 mmol/L   Glucose, Bld 197 (H) 70 - 99 mg/dL    Comment: Glucose reference range applies only to samples taken after fasting for at least 8 hours.   BUN 35 (H) 6 - 20 mg/dL   Creatinine, Ser 2.72 (L) 0.61 - 1.24 mg/dL   Calcium  8.4 (L) 8.9 - 10.3 mg/dL  Total Protein 5.9 (L) 6.5 - 8.1 g/dL   Albumin  2.1 (L) 3.5 - 5.0 g/dL   AST 14 (L) 15 - 41 U/L   ALT 15 0 - 44 U/L   Alkaline Phosphatase 216 (H) 38 - 126 U/L   Total Bilirubin 0.6 0.0 - 1.2 mg/dL   GFR, Estimated >16 >10 mL/min    Comment: (NOTE) Calculated using the CKD-EPI Creatinine Equation (2021)    Anion gap 7 5 - 15    Comment: Performed at [facility], [address], [place], [place] [address]  Type and screen [facility]     Status: None (Preliminary result)   Collection Time: 03/03/24 10:18 AM  Result Value Ref Range   ABO/RH(D) A POS    Antibody Screen NEG    Sample Expiration      03/06/2024,2359 Performed at [facility], [address] [place], [place] [address]    Unit Number [id number]    Blood Component Type RED CELLS,LR    Unit division 00    Status of Unit ALLOCATED    Transfusion Status OK TO TRANSFUSE    Crossmatch Result Compatible    Unit Number [id number]    Blood Component Type RED CELLS,LR    Unit division 00    Status of Unit ALLOCATED    Transfusion Status OK TO TRANSFUSE    Crossmatch Result Compatible   CBC with Differential/Platelet     Status: Abnormal   Collection Time: 03/03/24 10:18 AM  Result Value Ref Range   WBC 5.5 4.0 - 10.5 K/uL   RBC 2.55 (L) 4.22 - 5.81 MIL/uL   Hemoglobin 5.9 (LL) 13.0 - 17.0 g/dL    Comment: REPEATED TO VERIFY THIS CRITICAL RESULT HAS VERIFIED AND BEEN CALLED TO [place], K. BY JENNIFER ELGIN ON 06 05 2025 AT 1219, AND HAS BEEN READ BACK. CRITICAL RESULT VERIFIED    HCT 20.6 (L) 39.0 - 52.0 %   MCV 80.8 80.0 - 100.0 fL   MCH 23.1 (L) 26.0 - 34.0 pg    MCHC 28.6 (L) 30.0 - 36.0 g/dL   RDW 28.4 (H) 13.2 - 44.0 %   Platelets 318 150 - 400 K/uL   nRBC 0.0 0.0 - 0.2 %   Neutrophils Relative % 57 %   Neutro Abs 3.1 1.7 - 7.7 K/uL   Lymphocytes Relative 33 %   Lymphs Abs 1.8 0.7 - 4.0 K/uL   Monocytes Relative 6 %   Monocytes Absolute 0.4 0.1 - 1.0 K/uL   Eosinophils Relative 4 %   Eosinophils Absolute 0.2 0.0 - 0.5 K/uL   Basophils Relative 0 %   Basophils Absolute 0.0 0.0 - 0.1 K/uL   Immature Granulocytes 0 %   Abs Immature Granulocytes 0.01 0.00 - 0.07 K/uL    Comment: Performed at [facility], [address], [place], [place] [address]  I-Stat Lactic Acid     Status: None   Collection Time: 03/03/24 11:44 AM  Result Value Ref Range   Lactic Acid, Venous 0.6 0.5 - 1.9 mmol/L  Prepare RBC     Status: None   Collection Time: 03/03/24 12:31 PM  Result Value Ref Range   Order Confirmation      ORDER PROCESSED BY BLOOD BANK Performed at [facility], [address], [place], [place] [address]    No results found.  Anti-infectives (From admission, onward)    None       Assessment/Plan  Steven [name] is a 53 y.o. male who is known to our service with complex surgical history as outlined in HPI that presented today with acute on chronic anemia. Patient underwent outpatient labs yesterday that showed hemoglobin of 5.8.  Repeat hemoglobin today 5.9.  Baseline appears to be 7-8. He is on Xarelto . On last vitals here he is afebrile without tachycardia or hypotension. He reports at home he is tolerating diet without abdominal pain, n/v (specifically no hematemesis). He continues to have ostomy output that is non-bloody and does not appear dark or tarry. His wife has been changing his perineal wound BID with WTD dressing changes. They have noted some oozing after removing the dressing but no brisk bleeding and actually less bleeding from the wound with dressing changes over the last several days. No  gross hematuria via SP tube. On exam his abdomen is soft, ND, NT, no obvious bloody or melanous output or gross hematuria via SP tube. Perineal wound with some oozing controlled with pressure but no no pulsatile or brisk bleeding noted.   At this time I do not see any surgical needs. General surgery will sign off. Recommend continued management of diet, TPN, and perineal wound based on OSH surgical recommendations. If patient was to have a surgical issue arise, we would recommend transfer to [facility] for management, but again I do not see any surgical needs at this time. Defer dispo to [facility]. Message sent to EDP and [facility] regarding this. Please call back with questions or concerns.   I reviewed nursing notes, last 24 h vitals and pain scores, last 48 h intake and output, last 24 h labs and trends, and last 24 h imaging results.  Delton Filbert, [facility] Surgery 03/03/2024, 1:13 PM Please see Amion for pager number during day hours 7:00am-4:30pm

## 2024-03-03 NOTE — ED Notes (Signed)
 Family at bedside.

## 2024-03-03 NOTE — ED Notes (Signed)
 Tried to obtain another access point for blood admin, pt could not tolerate. Will use PICC line per provider ok

## 2024-03-03 NOTE — ED Notes (Signed)
 Emptied pt's colostomy bag per his request. Output was solid/semi solid, brown with some matter in it

## 2024-03-03 NOTE — ED Notes (Signed)
 Ostomy bag emptied. 360 mL emptied.

## 2024-03-03 NOTE — ED Notes (Signed)
 Pt has suprapubic catheter in place, dressing looks cdi

## 2024-03-03 NOTE — ED Notes (Signed)
 Family updated as to patient's status.

## 2024-03-03 NOTE — ED Notes (Signed)
 1st unit completed at 1645, T 98.82F, started next unit

## 2024-03-03 NOTE — ED Provider Notes (Signed)
 [place] [facility]ERG[facility] DE[facility]MENT AT [facility] Provider Note   CSN: [id number] Arrival date & time: 03/03/24  9147     History  Chief Complaint  Patient presents with   Weakness    Pt arrives c c/o low hbg of 5.8 per at home labs drawn yesterday. Pt is a cancer patient in remission, recent surgery for sacral debridement. Pt took 0.25mg  alprazolam  PTA, chronic foley in place, denies cp/sob    Steven [name] is a 53 y.o. male.  With a history of rectal cancer, PE on Xarelto  type 2 diabetes who presents to ED for abnormal labs.  Patient had outpatient labs drawn 2 days ago which showed low hemoglobin of 5.9.  Repeat hemoglobin was also low.  Recently underwent surgical exploration with minimal debridement from a specialist at [facility] on May 19.  Hemoglobin was normal thereafter.  No other recent surgeries.  No significant bleeding from the sacral wound in fact wife states it has been more dry than usual.  No hematemesis or hematuria.    Weakness      Home Medications Prior to Admission medications   Medication Sig Start Date End Date Taking? Authorizing Provider  acetaminophen  (TYLENOL ) 500 MG tablet Take 1,000 mg by mouth every 6 (six) hours as needed for mild pain.    [provider]  albuterol  (PROAIR  HFA) 108 (90 Base) MCG/ACT inhaler Inhale 2 puffs into the lungs every 4 (four) hours as needed for wheezing or shortness of breath. 08/30/10   [provider]  ALPRAZolam  (XANAX ) 0.5 MG tablet Take 0.5 mg by mouth 2 (two) times daily as needed for anxiety or sleep. 02/09/24   [provider]  ascorbic acid  (VITAMIN C ) 500 MG tablet Take 1 tablet (500 mg total) by mouth 2 (two) times daily. 01/13/24   Thomas, Alicia, MD  calcium  carbonate (TUMS - DOSED IN MG ELEMENTAL CALCIUM ) 500 MG chewable tablet Chew 2 tablets by mouth daily as needed for indigestion or heartburn.    [provider]  cyclobenzaprine  (FLEXERIL ) 10 MG tablet Take 1 tablet (10 mg  total) by mouth 3 (three) times daily as needed for muscle spasms. 02/11/24   Pickenpack-Cousar, Athena N, NP  Dapagliflozin  Pro-metFORMIN  ER (XIGDUO  XR) 06-999 MG TB24 Take 1 tablet by mouth daily.    [provider]  diclofenac  Sodium (VOLTAREN ) 1 % GEL Apply 2 g topically every 6 (six) hours as needed (pain in leg). 01/13/24   Joyce Nixon, MD  diphenhydrAMINE  (BENADRYL ) 25 mg capsule Take 1 capsule (25 mg total) by mouth every 6 (six) hours as needed for itching. 01/13/24   Joyce Nixon, MD  enoxaparin  (LOVENOX ) 30 MG/0.3ML injection Inject 0.3 mLs (30 mg total) into the skin every 12 (twelve) hours. 01/13/24   Joyce Nixon, MD  escitalopram  (LEXAPRO ) 10 MG tablet Take 1 tablet (10 mg total) by mouth at bedtime. 02/11/24   Pickenpack-Cousar, Giles Labrum, NP  fenofibrate  160 MG tablet Take 160 mg by mouth daily. 02/23/19   [provider]  ferrous sulfate  325 (65 FE) MG tablet Take 1 tablet (325 mg total) by mouth 2 (two) times daily with a meal. 01/14/24   Joyce Nixon, MD  Gauze Pads & Dressings (COMBINE ABD) 5"X9" PADS 1 Units by Does not apply route daily. 12/17/23   Joyce Nixon, MD  Gauze Pads & Dressings [facility] GAUZE ROLL LARGE) MISC 1 Units by Does not apply route daily. 12/17/23   Joyce Nixon, MD  magic mouthwash w/lidocaine  SOLN Take  10 mLs by mouth 3 (three) times daily. Suspension contains equal amounts of Maalox Extra Strength, nystatin , diphenhydramine  and lidocaine . 01/13/24   Joyce Nixon, MD  morphine  (MS CONTIN ) 60 MG 12 hr tablet Take 1 tablet (60 mg total) by mouth every 12 (twelve) hours. 03/11/24   Pickenpack-Cousar, Giles Labrum, NP  morphine  (MSIR) 15 MG tablet Take 0.5-1 tablets (7.5-15 mg total) by mouth every 4 (four) hours as needed for moderate pain (pain score 4-6) or severe pain (pain score 7-10). 03/01/24   Pickenpack-Cousar, Giles Labrum, NP  naphazoline-glycerin  (CLEAR EYES REDNESS) 0.012-0.25 % SOLN Place 1-2 drops into both eyes 4 (four) times daily as  needed for eye irritation. 01/13/24   Thomas, Alicia, MD  nortriptyline (PAMELOR) 10 MG capsule Take 10 mg by mouth 2 (two) times daily. 02/05/24   [provider]  nystatin  (MYCOSTATIN /NYSTOP ) powder Apply topically 2 (two) times daily. 01/13/24   Joyce Nixon, MD  piperacillin -tazobactam (ZOSYN ) 3.375 GM/50ML IVPB Inject 50 mLs (3.375 g total) into the vein every 8 (eight) hours. 01/13/24   Thomas, Alicia, MD  polycarbophil (FIBERCON) 625 MG tablet Take 1 tablet (625 mg total) by mouth 2 (two) times daily. 01/13/24   Joyce Nixon, MD  rivaroxaban  (XARELTO ) 20 MG TABS tablet Take 1 tablet (20 mg total) by mouth daily with supper. 04/13/23   Iruku, Praveena, MD  Simethicone  (GAS-X PO) Take 1-2 tablets by mouth as needed (gas).    [provider]  tetrahydrozoline-zinc  (VISINE-AC) 0.05-0.25 % ophthalmic solution Place 2 drops into both eyes daily as needed (allergy).    [provider]  zinc  sulfate, 50mg  elemental zinc , 220 (50 Zn) MG capsule Take 1 capsule (220 mg total) by mouth daily. 01/14/24   Joyce Nixon, MD      Allergies    Contrast media [iodinated contrast media]    Review of Systems   Review of Systems  Neurological:  Positive for weakness.    Physical Exam Updated Vital Signs BP 113/71   Pulse 92   Temp 98.8 F (37.1 C) (Oral)   Resp (!) 6   SpO2 90%  Physical Exam Vitals and nursing note reviewed.  HENT:     Head: Normocephalic and atraumatic.  Eyes:     Pupils: Pupils are equal, round, and reactive to light.  Cardiovascular:     Rate and Rhythm: Normal rate and regular rhythm.  Pulmonary:     Effort: Pulmonary effort is normal.     Breath sounds: Normal breath sounds.  Abdominal:     Palpations: Abdomen is soft.     Tenderness: There is no abdominal tenderness.     Comments: Ostomy bag in place over right abdomen with brown stool in collection bag No abdominal tenderness rebound rigidity guarding  Genitourinary:    Comments:  Suprapubic catheter in place draining clear yellow urine Musculoskeletal:     Comments: Large unstageable sacral ulcer with packing in place with minimal surrounding erythema Scant serosanguineous drainage No active bleeding visualized  Skin:    General: Skin is warm and dry.  Neurological:     Mental Status: He is alert.  Psychiatric:        Mood and Affect: Mood normal.     ED Results / Procedures / Treatments   Labs (all labs ordered are listed, but only abnormal results are displayed) Labs Reviewed  COMPREHENSIVE METABOLIC PANEL WITH GFR - Abnormal; Notable for the following components:      Result Value   Glucose, Bld 197 (*)  BUN 35 (*)    Creatinine, Ser 0.60 (*)    Calcium  8.4 (*)    Total Protein 5.9 (*)    Albumin  2.1 (*)    AST 14 (*)    Alkaline Phosphatase 216 (*)    All other components within normal limits  CBC WITH DIFFERENTIAL/PLATELET - Abnormal; Notable for the following components:   RBC 2.55 (*)    Hemoglobin 5.9 (*)    HCT 20.6 (*)    MCH 23.1 (*)    MCHC 28.6 (*)    RDW 18.6 (*)    All other components within normal limits  CULTURE, BLOOD (ROUTINE X 2)  CULTURE, BLOOD (ROUTINE X 2)  CBC WITH DIFFERENTIAL/PLATELET  I-STAT CG4 LACTIC ACID, ED  I-STAT CG4 LACTIC ACID, ED  TYPE AND SCREEN  PREPARE RBC (CROSSMATCH)    EKG None  Radiology No results found.  Procedures .Critical Care  Performed by: Sallyanne Creamer, DO Authorized by: Sallyanne Creamer, DO   Critical care provider statement:    Critical care time (minutes):  45   Critical care was necessary to treat or prevent imminent or life-threatening deterioration of the following conditions:  Circulatory failure   Critical care was time spent personally by me on the following activities:  Development of treatment plan with patient or surrogate, discussions with consultants, evaluation of patient's response to treatment, examination of patient, ordering and review of laboratory studies,  ordering and review of radiographic studies, ordering and performing treatments and interventions, pulse oximetry, re-evaluation of patient's condition, review of old charts and obtaining history from patient or surrogate   I assumed direction of critical care for this patient from another provider in my specialty: no     Care discussed with: admitting provider   Comments:     Discussed with surgery and hospitalist team     Medications Ordered in ED Medications  morphine  (MSIR) tablet 15 mg (15 mg Oral Given 03/03/24 1111)  acetaminophen  (TYLENOL ) tablet 1,000 mg (1,000 mg Oral Given 03/03/24 1111)  ALPRAZolam  (XANAX ) tablet 0.5 mg (0.5 mg Oral Given 03/03/24 1111)  0.9 %  sodium chloride  infusion (Manually program via Guardrails IV Fluids) ( Intravenous New Bag/Given 03/03/24 1336)    ED Course/ Medical Decision Making/ A&P Clinical Course as of 03/03/24 1510  Thu Mar 03, 2024  1245 Hemoglobin of 5.9.  Type and screen completed.  Ordered 2 units of RBCs.  Patient has remained hemodynamically stable.  Discussed with admitting hospitalist who recommends reaching out to general surgery first.  Paged general surgery [MP]  1504 Surgery team has evaluated patient and reviewed labs.  No need for surgical intervention at this time.  Patient will be admitted to hospitalist service.  He remains hemodynamically stable at this time [MP]    Clinical Course User Index [MP] Sallyanne Creamer, DO                                 Medical Decision Making 53 year old male with history as above presenting for low hemoglobin.  Recent surgical exploration of chronic sacral wound with plan for flap construction next month at [facility].  Hemoglobin of 5.82 days ago with home health labs.  Mildly tachycardic.  Blood pressure stable.  No acute evidence of bleeding on examination of the sacral wound.  Low suspicion for brisk acute bleeding.  Will obtain repeat laboratory workup including CBC and type and screen.  Will  provide  blood transfusion once hemoglobin is back.  Admission likely  Amount and/or Complexity of Data Reviewed Labs: ordered.  Risk OTC drugs. Prescription drug management. Decision regarding hospitalization.           Final Clinical Impression(s) / ED Diagnoses Final diagnoses:  Rectal cancer (HCC)  Symptomatic anemia  Wound of sacral region, initial encounter    Rx / DC Orders ED Discharge Orders     None         Sallyanne Creamer, DO 03/03/24 1510

## 2024-03-03 NOTE — Progress Notes (Signed)
 PHARMACY - TOTAL PARENTERAL NUTRITION CONSULT NOTE   Indication: Massive bowel resection  Patient Measurements:     There is no height or weight on file to calculate BMI. Usual Weight:   Assessment: 53 y.o. male with medical history significant for diabetes, rectal cancer, multiple fistulas, status post low anterior resection with loop ileostomy with complications including anastomotic leak, end colostomy, hematoma, etc.  Recent prolonged stay at [facility] now being admitted for acute on chronic anemia.  He has been on cyclic TPN since his last hospital stay, but taking some food by mouth.  Glucose / Insulin : Hx DM, moderate SSI - Glucose 197 Electrolytes: WNL - CorrCa 9.92 Renal: SCr 0.6, BUN 35 Hepatic: LFT, Tbili WNL.  Alk Phos 216.  Albumin  2.1 Intake / Output; MIVF:  - No mIVF.  2 unit PRBC transfused - colostomy - urethral catheter GI Imaging: GI Surgeries / Procedures:   Central access: PICC TPN start date: prior to admission  Nutritional Goals: Goal TPN rate is  mL/hr (provides  g of protein and  kcals per day)  02/25/24 [facility] Progress notes, Home TPN:   Volume 2100 mL over 12 hours (2 hour taper down), 7 days a week, daily lipids Protein 155g, Kcal 2470 daily  MACRONUTRIENTS Protein 1033.33 mL Clinisol 15% 155 Gm Dextrose  357.14 mL 70% 250 Gm  Lipid 500 mL SMOF 20% 100 Gm  CALORIES: kcal per Lipid Bag 2470  ELECTROLYTES (Amount per day) Sodium Chloride  (mEq): 115 mEq Sodium Phosphate  (mEq): 15 mEq Sodium Acetate (mEq): 185 mEq Potassium Chloride  (mEq): 80 mEq Calcium  Gluconate Lipid (mEq): 8 mEq Magnesium  Sulfate Lipid (mEq): 46 mEq  ADDITIVES (Amount per day) Trace Elements (mL): 1 mL  Multivitamin (mL): 10 mL Insulin  Units (regular): 58 units    RD Assessment:    Current Nutrition:  Regular diet - carb modified TPN  Plan:  TPN ordered after cut-off time and TPN unable to be made today. Pharmacy to order TPN on 6/6.  Start D5-0.9% NaCl IV fluids at  100ml/hr Continue Moderate SSI and CBGs and adjust as needed  Monitor TPN labs on Mon/Thurs, and PRN   Kendall Pauls PharmD, BCPS [facility] main pharmacy [phone] 03/03/2024,3:52 PM

## 2024-03-03 NOTE — ED Notes (Signed)
 Changed pt's dressing, kurlex with vashe and abd pads and tape. Pt offset pressure with pillows. And placed another pillow under head

## 2024-03-04 ENCOUNTER — Encounter (HOSPITAL_COMMUNITY): Payer: Self-pay | Admitting: Internal Medicine

## 2024-03-04 ENCOUNTER — Encounter (HOSPITAL_BASED_OUTPATIENT_CLINIC_OR_DEPARTMENT_OTHER): Admitting: Internal Medicine

## 2024-03-04 DIAGNOSIS — Z86718 Personal history of other venous thrombosis and embolism: Secondary | ICD-10-CM | POA: Diagnosis not present

## 2024-03-04 DIAGNOSIS — C20 Malignant neoplasm of rectum: Secondary | ICD-10-CM | POA: Diagnosis not present

## 2024-03-04 DIAGNOSIS — L89159 Pressure ulcer of sacral region, unspecified stage: Secondary | ICD-10-CM | POA: Diagnosis not present

## 2024-03-04 DIAGNOSIS — Z794 Long term (current) use of insulin: Secondary | ICD-10-CM | POA: Diagnosis not present

## 2024-03-04 DIAGNOSIS — E119 Type 2 diabetes mellitus without complications: Secondary | ICD-10-CM | POA: Diagnosis not present

## 2024-03-04 DIAGNOSIS — G8929 Other chronic pain: Secondary | ICD-10-CM | POA: Diagnosis not present

## 2024-03-04 DIAGNOSIS — D649 Anemia, unspecified: Secondary | ICD-10-CM | POA: Diagnosis not present

## 2024-03-04 DIAGNOSIS — F10920 Alcohol use, unspecified with intoxication, uncomplicated: Secondary | ICD-10-CM | POA: Diagnosis not present

## 2024-03-04 LAB — TYPE AND SCREEN
ABO/RH(D): A POS
Antibody Screen: NEGATIVE
Unit division: 0
Unit division: 0

## 2024-03-04 LAB — RETICULOCYTES
Immature Retic Fract: 34.8 % — ABNORMAL HIGH (ref 2.3–15.9)
RBC.: 3 MIL/uL — ABNORMAL LOW (ref 4.22–5.81)
Retic Count, Absolute: 137.4 10*3/uL (ref 19.0–186.0)
Retic Ct Pct: 4.6 % — ABNORMAL HIGH (ref 0.4–3.1)

## 2024-03-04 LAB — MAGNESIUM: Magnesium: 1.6 mg/dL — ABNORMAL LOW (ref 1.7–2.4)

## 2024-03-04 LAB — COMPREHENSIVE METABOLIC PANEL WITH GFR
ALT: 16 U/L (ref 0–44)
AST: 16 U/L (ref 15–41)
Albumin: 2.2 g/dL — ABNORMAL LOW (ref 3.5–5.0)
Alkaline Phosphatase: 217 U/L — ABNORMAL HIGH (ref 38–126)
Anion gap: 7 (ref 5–15)
BUN: 23 mg/dL — ABNORMAL HIGH (ref 6–20)
CO2: 23 mmol/L (ref 22–32)
Calcium: 8.7 mg/dL — ABNORMAL LOW (ref 8.9–10.3)
Chloride: 106 mmol/L (ref 98–111)
Creatinine, Ser: 0.53 mg/dL — ABNORMAL LOW (ref 0.61–1.24)
GFR, Estimated: >60 mL/min (ref >60)
Glucose, Bld: 159 mg/dL — ABNORMAL HIGH (ref 70–99)
Potassium: 4.2 mmol/L (ref 3.5–5.1)
Sodium: 136 mmol/L (ref 135–145)
Total Bilirubin: 0.8 mg/dL (ref 0.0–1.2)
Total Protein: 5.9 g/dL — ABNORMAL LOW (ref 6.5–8.1)

## 2024-03-04 LAB — CBC
HCT: 27 % — ABNORMAL LOW (ref 39.0–52.0)
Hemoglobin: 7.8 g/dL — ABNORMAL LOW (ref 13.0–17.0)
MCH: 24.3 pg — ABNORMAL LOW (ref 26.0–34.0)
MCHC: 28.9 g/dL — ABNORMAL LOW (ref 30.0–36.0)
MCV: 84.1 fL (ref 80.0–100.0)
Platelets: 284 10*3/uL (ref 150–400)
RBC: 3.21 MIL/uL — ABNORMAL LOW (ref 4.22–5.81)
RDW: 18.5 % — ABNORMAL HIGH (ref 11.5–15.5)
WBC: 5.8 10*3/uL (ref 4.0–10.5)
nRBC: 0 % (ref 0.0–0.2)

## 2024-03-04 LAB — GLUCOSE, CAPILLARY
Glucose-Capillary: 136 mg/dL — ABNORMAL HIGH (ref 70–99)
Glucose-Capillary: 113 mg/dL — ABNORMAL HIGH (ref 70–99)

## 2024-03-04 LAB — PHOSPHORUS: Phosphorus: 5 mg/dL — ABNORMAL HIGH (ref 2.5–4.6)

## 2024-03-04 LAB — BPAM RBC
Blood Product Expiration Date: 202507062359
Blood Product Expiration Date: 202507062359
ISSUE DATE / TIME: 202506051324
ISSUE DATE / TIME: 202506051645
Unit Type and Rh: 6200
Unit Type and Rh: 6200

## 2024-03-04 LAB — IRON AND TIBC
Iron: 26 ug/dL — ABNORMAL LOW (ref 45–182)
Saturation Ratios: 11 % — ABNORMAL LOW (ref 17.9–39.5)
TIBC: 239 ug/dL — ABNORMAL LOW (ref 250–450)
UIBC: 213 ug/dL

## 2024-03-04 LAB — FERRITIN: Ferritin: 103 ng/mL (ref 24–336)

## 2024-03-04 LAB — HIV ANTIBODY (ROUTINE TESTING W REFLEX): HIV Screen 4th Generation wRfx: NONREACTIVE

## 2024-03-04 LAB — FOLATE: Folate: 12.1 ng/mL (ref >5.9)

## 2024-03-04 LAB — VITAMIN B12: Vitamin B-12: 256 pg/mL (ref 180–914)

## 2024-03-04 MED ORDER — INSULIN ASPART 100 UNIT/ML IJ SOLN: 0.0000 [IU] | INTRAMUSCULAR | Status: DC

## 2024-03-04 MED ORDER — SODIUM CHLORIDE 0.9% FLUSH
10.0000 mL | Freq: Two times a day (BID) | INTRAVENOUS | Status: DC
  Administered 2024-03-04: 10 mL

## 2024-03-04 MED ORDER — CHLORHEXIDINE GLUCONATE CLOTH 2 % EX PADS
6.0000 | MEDICATED_PAD | Freq: Every day | CUTANEOUS | Status: DC
  Administered 2024-03-04: 6 via TOPICAL

## 2024-03-04 MED ORDER — GLUCERNA SHAKE PO LIQD
237.0000 mL | Freq: Three times a day (TID) | ORAL | Status: DC
  Filled 2024-03-04 (×2): qty 237

## 2024-03-04 MED ORDER — SODIUM CHLORIDE 0.9% FLUSH: 10.0000 mL | INTRAVENOUS | Status: DC | PRN

## 2024-03-04 MED ORDER — IRON SUCROSE 200 MG IVPB - SIMPLE MED
200.0000 mg | Freq: Once | Status: AC
  Administered 2024-03-04: 200 mg via INTRAVENOUS
  Filled 2024-03-04: qty 200

## 2024-03-04 MED ORDER — TRACE MINERALS CU-MN-SE-ZN 300-55-60-3000 MCG/ML IV SOLN
INTRAVENOUS | Status: DC
  Filled 2024-03-04: qty 1036

## 2024-03-04 MED ORDER — MAGNESIUM SULFATE 2 GM/50ML IV SOLN
2.0000 g | Freq: Once | INTRAVENOUS | Status: AC
  Administered 2024-03-04: 2 g via INTRAVENOUS
  Filled 2024-03-04: qty 50

## 2024-03-04 NOTE — Discharge Summary (Signed)
 Physician Discharge Summary  Steven [name] ION:[id number] DOB: 1971/01/09 DOA: 03/03/2024  PCP: [name], MD  Admit date: 03/03/2024 Discharge date: 03/04/2024 Discharging to: Home Recommendations for Outpatient Follow-up:  Please follow-up hemoglobin weekly (I have requested home health RN to draw this) and encourage patient to take the oral iron  that was previously prescribed to him   Discharge Diagnoses:   Principal Problem:   Symptomatic anemia Active Problems:   Adenocarcinoma of rectum [facility])     Hospital Course:  This is a 53 year old male with history of rectal cancer status post low anterior resection, loop ileostomy complicated by anastomotic leak, and colostomy, DVT/PE, diabetes mellitus who is maintained on TPN daily at home and presents to the hospital for hemoglobin of 5.9. Last CBC was checked on 5/15 and was 7.8 He was admitted for blood transfusions.  Principal Problem:   Symptomatic anemia-iron  deficiency in setting of rectal carcinoma - The patient has oral iron  prescribed but admits that he has not been taking this - He has not had any acute blood loss - Iron  saturation is low at 26 - 2 units of packed red blood cells given and hemoglobin has improved to 7.8 - IV iron  (Venofer ) given in the hospital - Advised patient that he needs to start taking the iron  that has been prescribed  Active Problems:  Hypomagnesemia - Magnesium  1.6 - Replaced    Adenocarcinoma of rectum - Colostomy appears to be working well - He receives daily TPN at home which will be resumed  History of DVT/PE - Continue Xarelto  since no acute blood loss noted  Diabetes mellitus - Last A1c was 7.5     Discharge Instructions   Allergies as of 03/04/2024       Reactions   Contrast Media [iodinated Contrast Media] Hives, Nausea Only, Other (See Comments)   As a 53 year old, experienced hives and nausea MUST HAVE BENADRYL  AND PREDNISONE  BEFOREHAND        Medication List      TAKE these medications    ALPRAZolam  0.5 MG tablet Commonly known as: XANAX  Take 0.5 mg by mouth 2 (two) times daily as needed for anxiety or sleep.   calcium  carbonate 500 MG chewable tablet Commonly known as: TUMS - dosed in mg elemental calcium  Chew 2 tablets by mouth daily as needed for indigestion or heartburn.   cyclobenzaprine  10 MG tablet Commonly known as: FLEXERIL  Take 1 tablet (10 mg total) by mouth 3 (three) times daily as needed for muscle spasms. What changed: additional instructions   diphenhydrAMINE  25 mg capsule Commonly known as: BENADRYL  Take 1 capsule (25 mg total) by mouth every 6 (six) hours as needed for itching. What changed:  when to take this additional instructions   escitalopram  10 MG tablet Commonly known as: LEXAPRO  Take 1 tablet (10 mg total) by mouth at bedtime. What changed: when to take this   ferrous sulfate  325 (65 FE) MG tablet Take 1 tablet (325 mg total) by mouth 2 (two) times daily with a meal.   GAS-X PO Take 1-2 tablets by mouth as needed (gas).   Kerlix Gauze Roll Large Misc 1 Units by Does not apply route daily.   Combine ABD 5"X9" Pads 1 Units by Does not apply route daily.   morphine  15 MG tablet Commonly known as: MSIR Take 0.5-1 tablets (7.5-15 mg total) by mouth every 4 (four) hours as needed for moderate pain (pain score 4-6) or severe pain (pain score 7-10). What changed:  how much to take when to take this additional instructions   morphine  60 MG 12 hr tablet Commonly known as: MS CONTIN  Take 1 tablet (60 mg total) by mouth every 12 (twelve) hours. Start taking on: March 11, 2024 What changed:  when to take this additional instructions   PRESCRIPTION MEDICATION See admin instructions. TPN from [facility] (scanned into the Media tab in Epic by Registration as a document on 03/03/2024) Beginning at 8-9 PM for twelve hours   ProAir  HFA 108 (90 Base) MCG/ACT inhaler Generic drug: albuterol  Inhale 2  puffs into the lungs every 4 (four) hours as needed for wheezing or shortness of breath.   rivaroxaban  20 MG Tabs tablet Commonly known as: XARELTO  Take 1 tablet (20 mg total) by mouth daily with supper. What changed:  when to take this additional instructions   tetrahydrozoline-zinc  0.05-0.25 % ophthalmic solution Commonly known as: VISINE-AC Place 2 drops into both eyes 3 (three) times daily as needed (for allergies).   TYLENOL  500 MG tablet Generic drug: acetaminophen  Take 500-1,000 mg by mouth See admin instructions. Take 1,000 mg by mouth at 6 AM, 10 AM, and 6 PM AND 500 mg at 2 PM            The results of significant diagnostics from this hospitalization (including imaging, microbiology, ancillary and laboratory) are listed below for reference.    No results found. Labs:   Basic Metabolic Panel: Recent Labs  Lab 03/03/24 1018 03/04/24 0516  NA 136 136  K 4.3 4.2  CL 104 106  CO2 25 23  GLUCOSE 197* 159*  BUN 35* 23*  CREATININE 0.60* 0.53*  CALCIUM  8.4* 8.7*  MG  --  1.6*  PHOS  --  5.0*     CBC: Recent Labs  Lab 03/03/24 1018 03/04/24 0516  WBC 5.5 5.8  NEUTROABS 3.1  --   HGB 5.9* 7.8*  HCT 20.6* 27.0*  MCV 80.8 84.1  PLT 318 284         SIGNED:   [name], MD  [facility] 03/04/2024, 4:52 PM Time taking on discharge: 50 minutes

## 2024-03-04 NOTE — Plan of Care (Signed)

## 2024-03-04 NOTE — Progress Notes (Signed)
 Messaged provider    [facility] requesting TPN prescription signed by you to be sent (faxed) to [phone] for patient's discharge this weekend and they will be abel to fill it

## 2024-03-04 NOTE — Progress Notes (Signed)
 PHARMACY - TOTAL PARENTERAL NUTRITION CONSULT NOTE   Indication: Massive bowel resection  Patient Measurements: Height: 5\' 10"  (177.8 cm) IBW/kg (Calculated) : 73   Body mass index is 26.26 kg/m. Usual Weight:   Assessment: 53 y.o. male with medical history significant for diabetes, rectal cancer, multiple fistulas, status post low anterior resection with loop ileostomy with complications including anastomotic leak, end colostomy, hematoma, etc.  Recent prolonged stay at [facility] now being admitted for acute on chronic anemia.  He has been on cyclic TPN since his last hospital stay, but taking some food by mouth.  Glucose / Insulin : Hx DM, moderate SSI - CBGs 226, 203, 136 (goal < 180) - 9 units SSI given Electrolytes: WNL except phos elevated at 5 and mag slightly low at 1.6 - mag sulfate 2g already ordered per Md - CorrCa 10.1 Renal: SCr 0.53, BUN 23 Hepatic: LFT, Tbili WNL.  Alk Phos 217.  Albumin  2.2 Intake / Output; MIVF:  - D5NS at 100 ml/hr.  2 unit PRBC transfused - colostomy - urethral catheter GI Imaging: GI Surgeries / Procedures:   Central access: PICC TPN start date: prior to admission  Nutritional Goals: Our cyclic TPN formula over 12 hours to provide 155 g of protein and 2486 kcals per day  02/25/24 [facility] Progress notes, Home TPN:   Volume 2100 mL over 12 hours (2 hour taper down), 7 days a week, daily lipids Protein 155g, Kcal 2470 daily  MACRONUTRIENTS Protein 1033.33 mL Clinisol 15% 155 Gm Dextrose  357.14 mL 70% 250 Gm  Lipid 500 mL SMOF 20% 100 Gm  CALORIES: kcal per Lipid Bag 2470  ELECTROLYTES (Amount per day) Sodium Chloride  (mEq): 115 mEq Sodium Phosphate  (mEq): 15 mEq Sodium Acetate (mEq): 185 mEq Potassium Chloride  (mEq): 80 mEq Calcium  Gluconate Lipid (mEq): 8 mEq Magnesium  Sulfate Lipid (mEq): 46 mEq  ADDITIVES (Amount per day) Trace Elements (mL): 1 mL  Multivitamin (mL): 10 mL Insulin  Units (regular): 58 units   RD Assessment:     Current Nutrition:  Regular diet - carb modified TPN  Plan:  Will restart cyclic TPN over 12 hours this evening and mimic as close as possible to above home formula except will remove phos due to currently being elevated and will lower insulin  in TPN from 58 he was getting in home formula to 20 units for now and adjust as needed Continue Moderate SSI and CBGs TID with meals and also will add CBGs to be checked while TPN is running from 6p-6a with SSI Monitor TPN labs on Mon/Thurs, and PRN   Luise Saint, PharmD, BCPS Secure Chat if ?s 03/04/2024 10:30 AM

## 2024-03-04 NOTE — Progress Notes (Signed)
 Left message for Steven [name] of [facility] [phone] for TPN when patient returns home

## 2024-03-04 NOTE — Progress Notes (Signed)
 Initial Nutrition Assessment  INTERVENTION:   -Glucerna Shake po TID, each supplement provides 220 kcal and 10 grams of protein   -Cyclic TPN per Pharmacy if patient remains in hospital  NUTRITION DIAGNOSIS:   Increased nutrient needs related to chronic illness as evidenced by estimated needs.  GOAL:   Patient will meet greater than or equal to 90% of their needs  MONITOR:   PO intake (TPN)  REASON FOR ASSESSMENT:   Consult New TPN/TNA  ASSESSMENT:   53 y.o. male with medical history significant for rectal cancer status post low anterior resection with loop ileostomy with complications including anastomotic leak, end colostomy, hematoma, etc. as well as history of DVT/PE on Xarelto  and diabetes with recent prolonged stay at [facility] now being admitted for acute on chronic anemia.  He has been on TPN since his last hospital stay, but taking some food by mouth. Admitted for acute on chronic anemia.  RD consulted as cyclic TPN was ordered to resume tonight. However, when speaking to patient he reports he may go home today. Has [facility] delivering stat TPN to home so he can resume at home.  Pt reports lately he has been able to eat a non-restrictive diet, eating what his family eats (meats, vegetables, sides, etc.) Drinks protein shakes Glucerna or Premier Protein at home. Prefers Glucerna, will order.  Pt reports no issues with eating other than not finishing his meals some days.   No weight for admission. Last weight from [facility], 184 lbs on 5/28.  Medications: Ferrous sulfate , D5 infusion, Mg sulfate  Labs reviewed: CBGs: 113-226 Low iron  Elevated Phos Low Mg   NUTRITION - FOCUSED PHYSICAL EXAM:  Flowsheet Row Most Recent Value  Orbital Region No depletion  Upper Arm Region Mild depletion  Thoracic and Lumbar Region Unable to assess  Buccal Region No depletion  Temple Region Mild depletion  Clavicle Bone Region Moderate depletion  Clavicle and Acromion Bone Region Moderate  depletion  Scapular Bone Region Moderate depletion  Dorsal Hand No depletion  Patellar Region No depletion  Anterior Thigh Region No depletion  Posterior Calf Region No depletion  Edema (RD Assessment) None  Hair Reviewed  Eyes Reviewed  Mouth Reviewed  [dry]  Skin Reviewed  [dry]       Diet Order:   Diet Order             Diet Carb Modified Fluid consistency: Thin; Room service appropriate? Yes  Diet effective now                   EDUCATION NEEDS:   No education needs have been identified at this time  Skin:  Skin Assessment: Reviewed RN Assessment  Last BM:  6/5  Height:   Ht Readings from Last 1 Encounters:  03/04/24 5\' 10"  (1.778 m)    Weight:   Wt Readings from Last 1 Encounters:  01/12/24 83 kg  Last weight from [facility] 02/24/24: 184 lbs  BMI:  Body mass index is 26.26 kg/m.  Estimated Nutritional Needs:   Kcal:  2100-2300  Protein:  125-140g  Fluid:  2.3L/day   Arna Better, MS, RD, LDN Inpatient Clinical Dietitian Contact via Secure chat

## 2024-03-04 NOTE — Plan of Care (Signed)

## 2024-03-05 DIAGNOSIS — C20 Malignant neoplasm of rectum: Secondary | ICD-10-CM | POA: Diagnosis not present

## 2024-03-06 DIAGNOSIS — C20 Malignant neoplasm of rectum: Secondary | ICD-10-CM | POA: Diagnosis not present

## 2024-03-07 ENCOUNTER — Encounter (HOSPITAL_BASED_OUTPATIENT_CLINIC_OR_DEPARTMENT_OTHER): Admitting: Internal Medicine

## 2024-03-08 ENCOUNTER — Encounter (HOSPITAL_BASED_OUTPATIENT_CLINIC_OR_DEPARTMENT_OTHER): Admitting: Internal Medicine

## 2024-03-08 DIAGNOSIS — C20 Malignant neoplasm of rectum: Secondary | ICD-10-CM | POA: Diagnosis not present

## 2024-03-08 DIAGNOSIS — Z79899 Other long term (current) drug therapy: Secondary | ICD-10-CM | POA: Diagnosis not present

## 2024-03-08 DIAGNOSIS — R112 Nausea with vomiting, unspecified: Secondary | ICD-10-CM | POA: Diagnosis not present

## 2024-03-08 DIAGNOSIS — D649 Anemia, unspecified: Secondary | ICD-10-CM | POA: Diagnosis not present

## 2024-03-08 LAB — CULTURE, BLOOD (ROUTINE X 2)
Culture: NO GROWTH
Culture: NO GROWTH
Special Requests: ADEQUATE

## 2024-03-09 ENCOUNTER — Encounter: Payer: Self-pay | Admitting: Physical Therapy

## 2024-03-09 ENCOUNTER — Encounter (HOSPITAL_BASED_OUTPATIENT_CLINIC_OR_DEPARTMENT_OTHER): Admitting: Internal Medicine

## 2024-03-09 ENCOUNTER — Other Ambulatory Visit: Payer: Self-pay

## 2024-03-09 ENCOUNTER — Ambulatory Visit: Admitting: Physical Therapy

## 2024-03-09 DIAGNOSIS — R262 Difficulty in walking, not elsewhere classified: Secondary | ICD-10-CM | POA: Diagnosis not present

## 2024-03-09 DIAGNOSIS — R2681 Unsteadiness on feet: Secondary | ICD-10-CM | POA: Diagnosis not present

## 2024-03-09 DIAGNOSIS — S31000A Unspecified open wound of lower back and pelvis without penetration into retroperitoneum, initial encounter: Secondary | ICD-10-CM | POA: Diagnosis not present

## 2024-03-09 DIAGNOSIS — M6281 Muscle weakness (generalized): Secondary | ICD-10-CM | POA: Insufficient documentation

## 2024-03-09 DIAGNOSIS — E11622 Type 2 diabetes mellitus with other skin ulcer: Secondary | ICD-10-CM | POA: Diagnosis not present

## 2024-03-09 DIAGNOSIS — C20 Malignant neoplasm of rectum: Secondary | ICD-10-CM

## 2024-03-09 DIAGNOSIS — L598 Other specified disorders of the skin and subcutaneous tissue related to radiation: Secondary | ICD-10-CM

## 2024-03-09 DIAGNOSIS — Z933 Colostomy status: Secondary | ICD-10-CM | POA: Diagnosis not present

## 2024-03-09 LAB — GLUCOSE, CAPILLARY
Glucose-Capillary: 138 mg/dL — ABNORMAL HIGH (ref 70–99)
Glucose-Capillary: 101 mg/dL — ABNORMAL HIGH (ref 70–99)
Glucose-Capillary: 213 mg/dL — ABNORMAL HIGH (ref 70–99)

## 2024-03-09 NOTE — Therapy (Signed)
 OUTPATIENT PHYSICAL THERAPY LOWER EXTREMITY EVALUATION   Patient Name: Steven [name] MRN: [id number] DOB:[birth year], 53 y.o., male Today's Date: 03/09/2024  END OF SESSION:  PT End of Session - 03/09/24 1348     Visit Number 1    Number of Visits 25    Date for PT Re-Evaluation 06/01/24    Authorization Type [employer]    Authorization Time Period 03/09/24 to 06/01/24    Authorization - Number of Visits 60    PT Start Time 1307   pt in bathroom   PT Stop Time 1345    PT Time Calculation (min) 38 min    Activity Tolerance Patient tolerated treatment well;Patient limited by fatigue;Patient limited by pain    Behavior During Therapy [facility] for tasks assessed/performed             Past Medical History:  Diagnosis Date   Anemia 02/2021   iron    Anxiety    Asthma    Bilateral pulmonary embolism (HCC) 04/24/2019   Dehiscence of anastomosis of large intestine 12/31/2022   Depression    Diabetes mellitus without complication (HCC)    History of kidney stones    HLD (hyperlipidemia)    Hypertension    Kidney stones    Neuromuscular disorder (HCC)    from radiation bi lat   OSA (obstructive sleep apnea)    CPAP   Peripheral vascular disease (HCC)    DVT   rectal ca 02/2021   Sleep apnea    Past Surgical History:  Procedure Laterality Date   APPENDECTOMY  1989   open appy   APPLICATION OF WOUND VAC N/A 12/30/2023   Procedure: APPLICATION, WOUND VAC;  Surgeon: Oralee Billow, MD;  Location: [facility];  Service: General;  Laterality: N/A;   BLADDER REPAIR  01/07/2024   Procedure: Closure of bladder cystotomy/fistula;  Surgeon: Roxane Copp, MD;  Location: [facility];  Service: Urology;;   COLONOSCOPY  03/05/2021   CYST EXCISION  2011   left neck   DIVERTING ILEOSTOMY N/A 07/17/2021   Procedure: DIVERTING ILEOSTOMY;  Surgeon: Joyce Nixon, MD;  Location: [facility];  Service: General;  Laterality: N/A;   FLEXIBLE SIGMOIDOSCOPY N/A 04/04/2022   Procedure: FLEXIBLE SIGMOIDOSCOPY  WITH POSSIBLE DEBRIDEMENT;  Surgeon: Joyce Nixon, MD;  Location: [facility] [place];  Service: General;  Laterality: N/A;   INCISION AND DRAINAGE OF WOUND N/A 01/04/2024   Procedure: IRRIGATION AND DEBRIDEMENT WOUND; WOUND VAC REPLACEMENT;  Surgeon: Shela Derby, MD;  Location: [facility];  Service: General;  Laterality: N/A;   INCISION AND DRAINAGE PERIRECTAL ABSCESS N/A 12/16/2023   Procedure: DEBRIDEMENT OF POST OP WOUND;  Surgeon: Joyce Nixon, MD;  Location: [facility];  Service: General;  Laterality: N/A;  I & D PERIONEAL WOUND   IRRIGATION AND DEBRIDEMENT ABSCESS N/A 12/30/2023   Procedure: IRRIGATION AND DEBRIDEMENT ABSCESS;  Surgeon: Oralee Billow, MD;  Location: [facility];  Service: General;  Laterality: N/A;   IRRIGATION AND DEBRIDEMENT ABSCESS N/A 01/01/2024   Procedure: IRRIGATION AND DEBRIDEMENT ABSCESS;  Surgeon: Oralee Billow, MD;  Location: [facility];  Service: General;  Laterality: N/A;   IRRIGATION AND DEBRIDEMENT ABSCESS N/A 01/07/2024   Procedure: IRRIGATION AND DEBRIDEMENT ABSCESS (N/A) - WOUND EXPLORATION AND DEBRIDEMENT.  CHANGE WOUND VAC DRESSING.  IRRIGATION AND DEBRIDEMENT OF ABDOMINAL WALL ABSCESS.;  Surgeon: Shela Derby, MD;  Location: [facility];  Service: General;  Laterality: N/A;   IRRIGATION AND DEBRIDEMENT ABSCESS N/A 01/11/2024   Procedure: EUA, WOUND VAC CHANGE, EXCISIONAL DEBRIDEMENT OF  TISSUE,  MUSCLE AND PERINIEUM.;  Surgeon: Aldean Hummingbird, MD;  Location: [facility];  Service: General;  Laterality: N/A;   RECTAL EXAM UNDER ANESTHESIA N/A 04/04/2022   Procedure: ANAL EXAM UNDER ANESTHESIA;  Surgeon: Joyce Nixon, MD;  Location: [facility];  Service: General;  Laterality: N/A;   RECTAL EXAM UNDER ANESTHESIA N/A 12/25/2023   Procedure: EXAM UNDER ANESTHESIA, RECTUM;  Surgeon: Sim Dryer, MD;  Location: [facility];  Service: General;  Laterality: N/A;   RECTOPEXY N/A 12/02/2023   Procedure: Perineal Proctatectomy;  Surgeon: Joyce Nixon, MD;   Location: [facility];  Service: General;  Laterality: N/A;  Completion Proctatectomy   WISDOM TOOTH EXTRACTION     age 26   XI ROBOTIC ASSISTED LOWER ANTERIOR RESECTION N/A 07/17/2021   Procedure: XI ROBOTIC ASSISTED LOWER ANTERIOR RESECTION;  Surgeon: Joyce Nixon, MD;  Location: [facility];  Service: General;  Laterality: N/A;   XI ROBOTIC ASSISTED LOWER ANTERIOR RESECTION N/A 12/31/2022   Procedure: ROBOTIC ASSISTED LOWER ANTERIOR RESECTION WITH OSTOMY with intraop ICG guided perfusion, takedown of iliostomy;  Surgeon: Joyce Nixon, MD;  Location: [facility];  Service: General;  Laterality: N/A;   Patient Active Problem List   Diagnosis Date Noted   Symptomatic anemia 03/03/2024   Goals of care, counseling/discussion 01/10/2024   Fistula 01/09/2024   History of rectal cancer 01/09/2024   Disruption of perineal wound in male 01/06/2024   Pain 01/06/2024   Post-operative hemoglobin drop 01/05/2024   High risk medication use 01/05/2024   Need for emotional support 01/05/2024   Counseling and coordination of care 01/05/2024   Medication management 01/05/2024   Palliative care encounter 01/05/2024   Anemia due to acute blood loss 12/25/2023   Necrosis of surgical wound (HCC) 12/15/2023   AKI (acute kidney injury) (HCC) 11/30/2023   Chronic pelvic abscess due to rectal stump disruption 11/29/2023   Parastomal hernia without obstruction or gangrene 11/29/2023   Colostomy in place [facility]) 11/29/2023   History of pulmonary embolus (PE) 11/29/2023   Chronic anticoagulation 11/29/2023   Intraperitoneal abscess (HCC) 11/29/2023   Peripheral neuropathy due to chemotherapy (HCC) 07/07/2022   Cancer related pain 04/30/2021   Tenesmus 04/16/2021   Chemotherapy-induced nausea 04/03/2021   Adenocarcinoma of rectum (HCC) 03/21/2021   Sensorineural hearing loss (SNHL) of both ears 01/21/2021   Gastroesophageal reflux disease 12/26/2020   Subjective hearing loss 12/18/2020   Uncontrolled diabetes mellitus  with hyperglycemia (HCC) 04/26/2019   Obesity (BMI 30-39.9) 04/26/2019   Asthma    Anxiety    OSA (obstructive sleep apnea)    HLD (hyperlipidemia)     PCP: Elyn Han MD   REFERRING PROVIDER: Asenath Blacker, NP  REFERRING DIAG: [phone] (ICD-10-CM) - Non-pressure chronic ulcer of back with unspecified severity R26.2 (ICD-10-CM) - Difficulty in walking, not elsewhere classified  THERAPY DIAG:  Muscle weakness (generalized)  Difficulty in walking, not elsewhere classified  Unsteadiness on feet  Rationale for Evaluation and Treatment: Rehabilitation  ONSET DATE: March 2025 (surgeries), cancer started 2022  SUBJECTIVE:   SUBJECTIVE STATEMENT:  I spent 2-3 months in the hospital laying in bed not able to really get up. Have a hx of rectal cancer with multiple surgeries to remove this and additional tissues, then had a blockage and ended up back in the hospital and more surgeries. At the end of this got some AIR at [facility] but it didn't add up to much, they weren't that aggressive. It was difficult to get around after getting home, now  able to stand and transfer. Getting hyperbaric therapy at [facility]. Using a waffle cushion in the chair now for wound/pressure relief, have to shift a lot. Need to build strength and stamina in LEs.   PERTINENT HISTORY: See above  PAIN:  Are you having pain? Yes: NPRS scale: 3-4 Pain location: wound  Pain description: pressure/sharp  Aggravating factors: pressure Relieving factors: offloading   PRECAUTIONS: Other: suprapubic cath, fistula in urethra, sacral wound, fall risk, colostomy, PICC  RED FLAGS: None   WEIGHT BEARING RESTRICTIONS: No  FALLS:  Has patient fallen in last 6 months? Yes. Number of falls 1 trying to get to bathroom, lower to ground situation with spouse   LIVING ENVIRONMENT: Lives with: lives with their spouse Lives in: House/apartment Stairs: stays on first floor/does not go upstairs for now  Has following equipment  at home: Environmental consultant - 2 wheeled, Wheelchair (manual), Grab bars, and Ramped entry  OCCUPATION: software develop- now on medical leave   PLOF: Independent, Independent with basic ADLs, Independent with gait, and Independent with transfers  PATIENT GOALS: improve strength, walk, improving balance, get back to PLOF   NEXT MD VISIT: PCP PRN   OBJECTIVE:  Note: Objective measures were completed at Evaluation unless otherwise noted.    PATIENT SURVEYS:    Patient-Specific Activity Scoring Scheme  0 represents "unable to perform." 10 represents "able to perform at prior level. 0 1 2 3 4 5 6 7 8 9  10 (Date and Score)   Activity Eval     1. Walking   4    2. Steps  0     3.     4.    5.    Score 2    Total score = sum of the activity scores/number of activities Minimum detectable change (90%CI) for average score = 2 points Minimum detectable change (90%CI) for single activity score = 3 points     COGNITION: Overall cognitive status: Within functional limits for tasks assessed     SENSATION: Not tested      LOWER EXTREMITY MMT:  MMT Right eval Left eval  Hip flexion 3+ 3+  Hip extension    Hip abduction    Hip adduction    Hip internal rotation    Hip external rotation    Knee flexion 4 4  Knee extension 3- 4  Ankle dorsiflexion 3 3  Ankle plantarflexion    Ankle inversion    Ankle eversion     (Blank rows = not tested)    FUNCTIONAL TESTS:  5 times sit to stand: 18.7 seconds use of UEs   GAIT: Distance walked: 195ft Assistive device utilized: Environmental consultant - 2 wheeled Level of assistance: CGA with WC follow  Comments: slow but                                                                                                                                 TREATMENT DATE:  03/09/24  Eval, POC, education on wound off loading (lateral shifts in WC, WC pushups every 10-15 minutes), walking program at home with family, expected progression given PLOF and  prior health, discussion ROJO cushion and recommended they ask wound MD about this tomorrow    PATIENT EDUCATION:  Education details: as above  Person educated: Patient and Spouse Education method: Explanation Education comprehension: verbalized understanding and needs further education  HOME EXERCISE PROGRAM:   2nd session   ASSESSMENT:  CLINICAL IMPRESSION: Patient is a 53 y.o. M who was seen today for physical therapy evaluation and treatment for L98.429 (ICD-10-CM) - Non-pressure chronic ulcer of back with unspecified severity R26.2 (ICD-10-CM) - Difficulty in walking, not elsewhere classified. He does have quite a complex medical hx with multiple surgeries and a short course of AIR at [facility]. Generally deconditioned as expected given medical condition and interventions performed this year. Session a bit limited due to stomach pain with exercise/mobility. Will make every effort to improve level of function.    OBJECTIVE IMPAIRMENTS: Abnormal gait, cardiopulmonary status limiting activity, decreased activity tolerance, decreased balance, decreased coordination, decreased knowledge of use of DME, decreased mobility, difficulty walking, decreased strength, and pain.   ACTIVITY LIMITATIONS: sitting, standing, squatting, stairs, transfers, and locomotion level  PARTICIPATION LIMITATIONS: driving, shopping, community activity, occupation, and yard work  PERSONAL FACTORS: Behavior pattern, Fitness, Past/current experiences, Social background, and Time since onset of injury/illness/exacerbation are also affecting patient's functional outcome.   REHAB POTENTIAL: Good  CLINICAL DECISION MAKING: Evolving/moderate complexity  EVALUATION COMPLEXITY: Moderate   GOALS: Goals reviewed with patient? No  SHORT TERM GOALS: Target date: 04/20/2024   Will be compliant with appropriate progressive HEP  Baseline: Goal status: INITIAL  2.  Will be able to complete 5xSTS in 12 seconds  Baseline:   Goal status: INITIAL  3.  Will be able to ambulate household distances LRAD, Mod(I) Baseline:  Goal status: INITIAL  4.  Will be compliant with appropriate offloading schedule to assist in pressure relief/wound healing  Baseline:  Goal status: INITIAL  5.  Will be able to complete all bed mobility with Mod(I) Baseline:  Goal status: INITIAL    LONG TERM GOALS: Target date: 06/01/2024    MMT to have improved by at least 1 grade all weak groups  Baseline:  Goal status: INITIAL  2.  Will be able to ambulate at least 726ft with LRAD, Mod(I) to improve community access  Baseline:  Goal status: INITIAL  3.  Will score at least 45 on Berg  Baseline:  Goal status: INITIAL  4.  Will be able to navigate full flight of steps and rail, Mod(I) Baseline:  Goal status: INITIAL  5.  PSFS to have improved by at least 3 points  Baseline:  Goal status: INITIAL     PLAN:  PT FREQUENCY: 2x/week  PT DURATION: 12 weeks  PLANNED INTERVENTIONS: 97750- Physical Performance Testing, 97110-Therapeutic exercises, 97530- Therapeutic activity, 97112- Neuromuscular re-education, 97535- Self Care, 16109- Manual therapy, 97116- Gait training, and DME instructions  PLAN FOR NEXT SESSION: needs HEP, strength, functional transfers, functional activity tolerance, balance, f/u on ROJO PRN. CAUTION WITH NUSTEP/BIKE DUE TO SACRAL WOUND   Terrel Ferries, PT, DPT 03/09/24 1:49 PM

## 2024-03-10 ENCOUNTER — Encounter (HOSPITAL_BASED_OUTPATIENT_CLINIC_OR_DEPARTMENT_OTHER): Admitting: Internal Medicine

## 2024-03-10 DIAGNOSIS — S31000A Unspecified open wound of lower back and pelvis without penetration into retroperitoneum, initial encounter: Secondary | ICD-10-CM | POA: Diagnosis not present

## 2024-03-10 DIAGNOSIS — L598 Other specified disorders of the skin and subcutaneous tissue related to radiation: Secondary | ICD-10-CM

## 2024-03-10 DIAGNOSIS — C20 Malignant neoplasm of rectum: Secondary | ICD-10-CM

## 2024-03-10 DIAGNOSIS — Z933 Colostomy status: Secondary | ICD-10-CM | POA: Diagnosis not present

## 2024-03-10 DIAGNOSIS — E11622 Type 2 diabetes mellitus with other skin ulcer: Secondary | ICD-10-CM | POA: Diagnosis not present

## 2024-03-10 LAB — GLUCOSE, CAPILLARY
Glucose-Capillary: 174 mg/dL — ABNORMAL HIGH (ref 70–99)
Glucose-Capillary: 168 mg/dL — ABNORMAL HIGH (ref 70–99)

## 2024-03-11 ENCOUNTER — Encounter (HOSPITAL_BASED_OUTPATIENT_CLINIC_OR_DEPARTMENT_OTHER): Admitting: Internal Medicine

## 2024-03-11 DIAGNOSIS — L598 Other specified disorders of the skin and subcutaneous tissue related to radiation: Secondary | ICD-10-CM

## 2024-03-11 DIAGNOSIS — C20 Malignant neoplasm of rectum: Secondary | ICD-10-CM | POA: Diagnosis not present

## 2024-03-11 DIAGNOSIS — Z933 Colostomy status: Secondary | ICD-10-CM | POA: Diagnosis not present

## 2024-03-11 DIAGNOSIS — S31000A Unspecified open wound of lower back and pelvis without penetration into retroperitoneum, initial encounter: Secondary | ICD-10-CM

## 2024-03-11 DIAGNOSIS — E11622 Type 2 diabetes mellitus with other skin ulcer: Secondary | ICD-10-CM | POA: Diagnosis not present

## 2024-03-11 LAB — GLUCOSE, CAPILLARY
Glucose-Capillary: 154 mg/dL — ABNORMAL HIGH (ref 70–99)
Glucose-Capillary: 154 mg/dL — ABNORMAL HIGH (ref 70–99)

## 2024-03-12 DIAGNOSIS — C20 Malignant neoplasm of rectum: Secondary | ICD-10-CM | POA: Diagnosis not present

## 2024-03-13 DIAGNOSIS — C20 Malignant neoplasm of rectum: Secondary | ICD-10-CM | POA: Diagnosis not present

## 2024-03-14 ENCOUNTER — Encounter (HOSPITAL_BASED_OUTPATIENT_CLINIC_OR_DEPARTMENT_OTHER): Admitting: Internal Medicine

## 2024-03-14 DIAGNOSIS — S31000A Unspecified open wound of lower back and pelvis without penetration into retroperitoneum, initial encounter: Secondary | ICD-10-CM | POA: Diagnosis not present

## 2024-03-14 DIAGNOSIS — E11622 Type 2 diabetes mellitus with other skin ulcer: Secondary | ICD-10-CM | POA: Diagnosis not present

## 2024-03-14 DIAGNOSIS — L598 Other specified disorders of the skin and subcutaneous tissue related to radiation: Secondary | ICD-10-CM | POA: Diagnosis not present

## 2024-03-14 DIAGNOSIS — C20 Malignant neoplasm of rectum: Secondary | ICD-10-CM

## 2024-03-14 DIAGNOSIS — Z933 Colostomy status: Secondary | ICD-10-CM | POA: Diagnosis not present

## 2024-03-14 LAB — GLUCOSE, CAPILLARY
Glucose-Capillary: 171 mg/dL — ABNORMAL HIGH (ref 70–99)
Glucose-Capillary: 162 mg/dL — ABNORMAL HIGH (ref 70–99)

## 2024-03-15 ENCOUNTER — Encounter (HOSPITAL_BASED_OUTPATIENT_CLINIC_OR_DEPARTMENT_OTHER): Admitting: Internal Medicine

## 2024-03-15 DIAGNOSIS — L598 Other specified disorders of the skin and subcutaneous tissue related to radiation: Secondary | ICD-10-CM

## 2024-03-15 DIAGNOSIS — S31000A Unspecified open wound of lower back and pelvis without penetration into retroperitoneum, initial encounter: Secondary | ICD-10-CM | POA: Diagnosis not present

## 2024-03-15 DIAGNOSIS — E11622 Type 2 diabetes mellitus with other skin ulcer: Secondary | ICD-10-CM | POA: Diagnosis not present

## 2024-03-15 DIAGNOSIS — Z933 Colostomy status: Secondary | ICD-10-CM | POA: Diagnosis not present

## 2024-03-15 DIAGNOSIS — C20 Malignant neoplasm of rectum: Secondary | ICD-10-CM

## 2024-03-15 LAB — GLUCOSE, CAPILLARY
Glucose-Capillary: 179 mg/dL — ABNORMAL HIGH (ref 70–99)
Glucose-Capillary: 184 mg/dL — ABNORMAL HIGH (ref 70–99)

## 2024-03-16 ENCOUNTER — Other Ambulatory Visit: Payer: Self-pay

## 2024-03-16 ENCOUNTER — Other Ambulatory Visit: Payer: Self-pay | Admitting: Nurse Practitioner

## 2024-03-16 ENCOUNTER — Encounter (HOSPITAL_BASED_OUTPATIENT_CLINIC_OR_DEPARTMENT_OTHER): Admitting: Internal Medicine

## 2024-03-16 DIAGNOSIS — Z515 Encounter for palliative care: Secondary | ICD-10-CM

## 2024-03-16 DIAGNOSIS — E11622 Type 2 diabetes mellitus with other skin ulcer: Secondary | ICD-10-CM | POA: Diagnosis not present

## 2024-03-16 DIAGNOSIS — S31000A Unspecified open wound of lower back and pelvis without penetration into retroperitoneum, initial encounter: Secondary | ICD-10-CM

## 2024-03-16 DIAGNOSIS — C20 Malignant neoplasm of rectum: Secondary | ICD-10-CM

## 2024-03-16 DIAGNOSIS — Z933 Colostomy status: Secondary | ICD-10-CM | POA: Diagnosis not present

## 2024-03-16 DIAGNOSIS — L598 Other specified disorders of the skin and subcutaneous tissue related to radiation: Secondary | ICD-10-CM

## 2024-03-16 DIAGNOSIS — G893 Neoplasm related pain (acute) (chronic): Secondary | ICD-10-CM

## 2024-03-16 LAB — GLUCOSE, CAPILLARY
Glucose-Capillary: 184 mg/dL — ABNORMAL HIGH (ref 70–99)
Glucose-Capillary: 178 mg/dL — ABNORMAL HIGH (ref 70–99)

## 2024-03-16 MED ORDER — MORPHINE SULFATE 15 MG PO TABS
7.5000 mg | ORAL_TABLET | ORAL | 0 refills | Status: DC | PRN
Start: 2024-03-16 — End: 2024-03-16

## 2024-03-16 MED ORDER — MORPHINE SULFATE 15 MG PO TABS: 7.5000 mg | ORAL_TABLET | ORAL | 0 refills | Status: DC | PRN

## 2024-03-16 MED ORDER — MORPHINE SULFATE ER 60 MG PO TBCR: 60.0000 mg | EXTENDED_RELEASE_TABLET | Freq: Two times a day (BID) | ORAL | 0 refills | Status: DC

## 2024-03-17 ENCOUNTER — Encounter (HOSPITAL_BASED_OUTPATIENT_CLINIC_OR_DEPARTMENT_OTHER): Admitting: Internal Medicine

## 2024-03-17 DIAGNOSIS — C20 Malignant neoplasm of rectum: Secondary | ICD-10-CM

## 2024-03-17 DIAGNOSIS — Z933 Colostomy status: Secondary | ICD-10-CM | POA: Diagnosis not present

## 2024-03-17 DIAGNOSIS — S31000A Unspecified open wound of lower back and pelvis without penetration into retroperitoneum, initial encounter: Secondary | ICD-10-CM | POA: Diagnosis not present

## 2024-03-17 DIAGNOSIS — L598 Other specified disorders of the skin and subcutaneous tissue related to radiation: Secondary | ICD-10-CM | POA: Diagnosis not present

## 2024-03-17 DIAGNOSIS — E11622 Type 2 diabetes mellitus with other skin ulcer: Secondary | ICD-10-CM | POA: Diagnosis not present

## 2024-03-17 LAB — GLUCOSE, CAPILLARY
Glucose-Capillary: 168 mg/dL — ABNORMAL HIGH (ref 70–99)
Glucose-Capillary: 167 mg/dL — ABNORMAL HIGH (ref 70–99)

## 2024-03-18 ENCOUNTER — Encounter (HOSPITAL_BASED_OUTPATIENT_CLINIC_OR_DEPARTMENT_OTHER): Admitting: Internal Medicine

## 2024-03-18 DIAGNOSIS — C20 Malignant neoplasm of rectum: Secondary | ICD-10-CM | POA: Diagnosis not present

## 2024-03-19 DIAGNOSIS — C20 Malignant neoplasm of rectum: Secondary | ICD-10-CM | POA: Diagnosis not present

## 2024-03-20 DIAGNOSIS — C20 Malignant neoplasm of rectum: Secondary | ICD-10-CM | POA: Diagnosis not present

## 2024-03-21 ENCOUNTER — Ambulatory Visit: Admitting: Physical Therapy

## 2024-03-21 ENCOUNTER — Encounter (HOSPITAL_BASED_OUTPATIENT_CLINIC_OR_DEPARTMENT_OTHER): Admitting: Internal Medicine

## 2024-03-21 ENCOUNTER — Encounter: Payer: Self-pay | Admitting: Physical Therapy

## 2024-03-21 DIAGNOSIS — C20 Malignant neoplasm of rectum: Secondary | ICD-10-CM

## 2024-03-21 DIAGNOSIS — S31000A Unspecified open wound of lower back and pelvis without penetration into retroperitoneum, initial encounter: Secondary | ICD-10-CM | POA: Diagnosis not present

## 2024-03-21 DIAGNOSIS — R2681 Unsteadiness on feet: Secondary | ICD-10-CM

## 2024-03-21 DIAGNOSIS — M6281 Muscle weakness (generalized): Secondary | ICD-10-CM

## 2024-03-21 DIAGNOSIS — R262 Difficulty in walking, not elsewhere classified: Secondary | ICD-10-CM

## 2024-03-21 DIAGNOSIS — L598 Other specified disorders of the skin and subcutaneous tissue related to radiation: Secondary | ICD-10-CM

## 2024-03-21 DIAGNOSIS — E11622 Type 2 diabetes mellitus with other skin ulcer: Secondary | ICD-10-CM | POA: Diagnosis not present

## 2024-03-21 DIAGNOSIS — Z933 Colostomy status: Secondary | ICD-10-CM | POA: Diagnosis not present

## 2024-03-21 LAB — GLUCOSE, CAPILLARY
Glucose-Capillary: 191 mg/dL — ABNORMAL HIGH (ref 70–99)
Glucose-Capillary: 177 mg/dL — ABNORMAL HIGH (ref 70–99)

## 2024-03-21 NOTE — Therapy (Signed)
 OUTPATIENT PHYSICAL THERAPY LOWER EXTREMITY EVALUATION   Patient Name: Steven [name] MRN: [id number] DOB:1970/10/25, 53 y.o., male Today's Date: 03/21/2024  END OF SESSION:  PT End of Session - 03/21/24 1426     Visit Number 2    Date for PT Re-Evaluation 06/01/24    PT Start Time 1430    PT Stop Time 1515    PT Time Calculation (min) 45 min    Activity Tolerance Patient tolerated treatment well;Patient limited by fatigue;Patient limited by pain    Behavior During Therapy [facility] for tasks assessed/performed          Past Medical History:  Diagnosis Date   Anemia 02/2021   iron    Anxiety    Asthma    Bilateral pulmonary embolism (HCC) 04/24/2019   Dehiscence of anastomosis of large intestine 12/31/2022   Depression    Diabetes mellitus without complication (HCC)    History of kidney stones    HLD (hyperlipidemia)    Hypertension    Kidney stones    Neuromuscular disorder (HCC)    from radiation bi lat   OSA (obstructive sleep apnea)    CPAP   Peripheral vascular disease (HCC)    DVT   rectal ca 02/2021   Sleep apnea    Past Surgical History:  Procedure Laterality Date   APPENDECTOMY  1989   open appy   APPLICATION OF WOUND VAC N/A 12/30/2023   Procedure: APPLICATION, WOUND VAC;  Surgeon: Eletha Boas, MD;  Location: [facility];  Service: General;  Laterality: N/A;   BLADDER REPAIR  01/07/2024   Procedure: Closure of bladder cystotomy/fistula;  Surgeon: Elisabeth Valli BIRCH, MD;  Location: [facility];  Service: Urology;;   COLONOSCOPY  03/05/2021   CYST EXCISION  2011   left neck   DIVERTING ILEOSTOMY N/A 07/17/2021   Procedure: DIVERTING ILEOSTOMY;  Surgeon: Debby Hila, MD;  Location: [facility];  Service: General;  Laterality: N/A;   FLEXIBLE SIGMOIDOSCOPY N/A 04/04/2022   Procedure: FLEXIBLE SIGMOIDOSCOPY WITH POSSIBLE DEBRIDEMENT;  Surgeon: Debby Hila, MD;  Location: [facility];  Service: General;  Laterality: N/A;   INCISION AND DRAINAGE OF WOUND  N/A 01/04/2024   Procedure: IRRIGATION AND DEBRIDEMENT WOUND; WOUND VAC REPLACEMENT;  Surgeon: Rubin Calamity, MD;  Location: [facility];  Service: General;  Laterality: N/A;   INCISION AND DRAINAGE PERIRECTAL ABSCESS N/A 12/16/2023   Procedure: DEBRIDEMENT OF POST OP WOUND;  Surgeon: Debby Hila, MD;  Location: [facility];  Service: General;  Laterality: N/A;  I & D PERIONEAL WOUND   IRRIGATION AND DEBRIDEMENT ABSCESS N/A 12/30/2023   Procedure: IRRIGATION AND DEBRIDEMENT ABSCESS;  Surgeon: Eletha Boas, MD;  Location: [facility];  Service: General;  Laterality: N/A;   IRRIGATION AND DEBRIDEMENT ABSCESS N/A 01/01/2024   Procedure: IRRIGATION AND DEBRIDEMENT ABSCESS;  Surgeon: Eletha Boas, MD;  Location: [facility];  Service: General;  Laterality: N/A;   IRRIGATION AND DEBRIDEMENT ABSCESS N/A 01/07/2024   Procedure: IRRIGATION AND DEBRIDEMENT ABSCESS (N/A) - WOUND EXPLORATION AND DEBRIDEMENT.  CHANGE WOUND VAC DRESSING.  IRRIGATION AND DEBRIDEMENT OF ABDOMINAL WALL ABSCESS.;  Surgeon: Rubin Calamity, MD;  Location: [facility];  Service: General;  Laterality: N/A;   IRRIGATION AND DEBRIDEMENT ABSCESS N/A 01/11/2024   Procedure: EUA, WOUND VAC CHANGE, EXCISIONAL DEBRIDEMENT OF TISSUE,  MUSCLE AND PERINIEUM.;  Surgeon: Tanda Locus, MD;  Location: [facility];  Service: General;  Laterality: N/A;   RECTAL EXAM UNDER ANESTHESIA N/A 04/04/2022   Procedure: ANAL EXAM UNDER ANESTHESIA;  Surgeon: Debby,  Bernarda, MD;  Location: [facility];  Service: General;  Laterality: N/A;   RECTAL EXAM UNDER ANESTHESIA N/A 12/25/2023   Procedure: EXAM UNDER ANESTHESIA, RECTUM;  Surgeon: Vanderbilt Ned, MD;  Location: [facility];  Service: General;  Laterality: N/A;   RECTOPEXY N/A 12/02/2023   Procedure: Perineal Proctatectomy;  Surgeon: Ned Bernarda, MD;  Location: [facility];  Service: General;  Laterality: N/A;  Completion Proctatectomy   WISDOM TOOTH EXTRACTION     age 69   XI ROBOTIC ASSISTED LOWER ANTERIOR RESECTION  N/A 07/17/2021   Procedure: XI ROBOTIC ASSISTED LOWER ANTERIOR RESECTION;  Surgeon: Ned Bernarda, MD;  Location: [facility];  Service: General;  Laterality: N/A;   XI ROBOTIC ASSISTED LOWER ANTERIOR RESECTION N/A 12/31/2022   Procedure: ROBOTIC ASSISTED LOWER ANTERIOR RESECTION WITH OSTOMY with intraop ICG guided perfusion, takedown of iliostomy;  Surgeon: Ned Bernarda, MD;  Location: [facility];  Service: General;  Laterality: N/A;   Patient Active Problem List   Diagnosis Date Noted   Symptomatic anemia 03/03/2024   Goals of care, counseling/discussion 01/10/2024   Fistula 01/09/2024   History of rectal cancer 01/09/2024   Disruption of perineal wound in male 01/06/2024   Pain 01/06/2024   Post-operative hemoglobin drop 01/05/2024   High risk medication use 01/05/2024   Need for emotional support 01/05/2024   Counseling and coordination of care 01/05/2024   Medication management 01/05/2024   Palliative care encounter 01/05/2024   Anemia due to acute blood loss 12/25/2023   Necrosis of surgical wound (HCC) 12/15/2023   AKI (acute kidney injury) (HCC) 11/30/2023   Chronic pelvic abscess due to rectal stump disruption 11/29/2023   Parastomal hernia without obstruction or gangrene 11/29/2023   Colostomy in place [facility]) 11/29/2023   History of pulmonary embolus (PE) 11/29/2023   Chronic anticoagulation 11/29/2023   Intraperitoneal abscess (HCC) 11/29/2023   Peripheral neuropathy due to chemotherapy (HCC) 07/07/2022   Cancer related pain 04/30/2021   Tenesmus 04/16/2021   Chemotherapy-induced nausea 04/03/2021   Adenocarcinoma of rectum (HCC) 03/21/2021   Sensorineural hearing loss (SNHL) of both ears 01/21/2021   Gastroesophageal reflux disease 12/26/2020   Subjective hearing loss 12/18/2020   Uncontrolled diabetes mellitus with hyperglycemia (HCC) 04/26/2019   Obesity (BMI 30-39.9) 04/26/2019   Asthma    Anxiety    OSA (obstructive sleep apnea)    HLD (hyperlipidemia)     PCP:  Arloa Fallow MD   REFERRING PROVIDER: Thresa Marylynn Phi, NP  REFERRING DIAG: [id number] (ICD-10-CM) - Non-pressure chronic ulcer of back with unspecified severity R26.2 (ICD-10-CM) - Difficulty in walking, not elsewhere classified  THERAPY DIAG:  Muscle weakness (generalized)  Difficulty in walking, not elsewhere classified  Unsteadiness on feet  Rationale for Evaluation and Treatment: Rehabilitation  ONSET DATE: March 2025 (surgeries), cancer started 2022  SUBJECTIVE:   SUBJECTIVE STATEMENT:  Doing oh, going to [facility] tomorrow   I spent 2-3 months in the hospital laying in bed not able to really get up. Have a hx of rectal cancer with multiple surgeries to remove this and additional tissues, then had a blockage and ended up back in the hospital and more surgeries. At the end of this got some AIR at [facility] but it didn't add up to much, they weren't that aggressive. It was difficult to get around after getting home, now able to stand and transfer. Getting hyperbaric therapy at [facility]. Using a waffle cushion in the chair now for wound/pressure relief, have to shift a lot. Need to build strength  and stamina in LEs.   PERTINENT HISTORY: See above  PAIN:  Are you having pain? Yes: NPRS scale: 4/10 Pain location: wound  Pain description: pressure/sharp  Aggravating factors: pressure Relieving factors: offloading   PRECAUTIONS: Other: suprapubic cath, fistula in urethra, sacral wound, fall risk, colostomy, PICC  RED FLAGS: None   WEIGHT BEARING RESTRICTIONS: No  FALLS:  Has patient fallen in last 6 months? Yes. Number of falls 1 trying to get to bathroom, lower to ground situation with spouse   LIVING ENVIRONMENT: Lives with: lives with their spouse Lives in: House/apartment Stairs: stays on first floor/does not go upstairs for now  Has following equipment at home: Environmental consultant - 2 wheeled, Wheelchair (manual), Grab bars, and Ramped entry  OCCUPATION: software develop- now on medical  leave   PLOF: Independent, Independent with basic ADLs, Independent with gait, and Independent with transfers  PATIENT GOALS: improve strength, walk, improving balance, get back to PLOF   NEXT MD VISIT: PCP PRN   OBJECTIVE:  Note: Objective measures were completed at Evaluation unless otherwise noted.    PATIENT SURVEYS:    Patient-Specific Activity Scoring Scheme  0 represents "unable to perform." 10 represents "able to perform at prior level. 0 1 2 3 4 5 6 7 8 9  10 (Date and Score)   Activity Eval     1. Walking   4    2. Steps  0     3.     4.    5.    Score 2    Total score = sum of the activity scores/number of activities Minimum detectable change (90%CI) for average score = 2 points Minimum detectable change (90%CI) for single activity score = 3 points     COGNITION: Overall cognitive status: Within functional limits for tasks assessed     SENSATION: Not tested      LOWER EXTREMITY MMT:  MMT Right eval Left eval  Hip flexion 3+ 3+  Hip extension    Hip abduction    Hip adduction    Hip internal rotation    Hip external rotation    Knee flexion 4 4  Knee extension 3- 4  Ankle dorsiflexion 3 3  Ankle plantarflexion    Ankle inversion    Ankle eversion     (Blank rows = not tested)    FUNCTIONAL TESTS:  5 times sit to stand: 18.7 seconds use of UEs   GAIT: Distance walked: 132ft Assistive device utilized: Environmental consultant - 2 wheeled Level of assistance: CGA with WC follow  Comments: slow but                                                                                                                                 TREATMENT DATE:  03/21/24 NuStep L 3 x 3 min stopped due to wound  Sit to stand 2x10 elevated surface Standing OHP yellow wball 2x10 Standing in HS curls 2lb 2x10  LAQ 2lb 2x10 Standing reaching outside base of support   On airex x10  Side steps at mat table  03/09/24  Eval, POC, education on wound off loading  (lateral shifts in WC, WC pushups every 10-15 minutes), walking program at home with family, expected progression given PLOF and prior health, discussion ROJO cushion and recommended they ask wound MD about this tomorrow    PATIENT EDUCATION:  Education details: as above  Person educated: Patient and Spouse Education method: Explanation Education comprehension: verbalized understanding and needs further education  HOME EXERCISE PROGRAM:   3rd session   ASSESSMENT:  CLINICAL IMPRESSION: Patient is a 52 y.o. M who was seen today for physical therapy evaluation and treatment for L98.429 (ICD-10-CM) - Non-pressure chronic ulcer of back with unspecified severity R26.2 (ICD-10-CM) - Difficulty in walking, not elsewhere classified. He does have quite a complex medical hx with multiple surgeries and a short course of AIR at [facility]. Pt enters with hs wife motivated. Effort spent on trying to prevent any prolong sitting due to pressure relief on wound. This did post an issue with endurance due to pt being very deconditioned. Proper form and technique needed with standing marches. Visible LE shaking with sit to stands.. Will make every effort to improve level of function.    OBJECTIVE IMPAIRMENTS: Abnormal gait, cardiopulmonary status limiting activity, decreased activity tolerance, decreased balance, decreased coordination, decreased knowledge of use of DME, decreased mobility, difficulty walking, decreased strength, and pain.   ACTIVITY LIMITATIONS: sitting, standing, squatting, stairs, transfers, and locomotion level  PARTICIPATION LIMITATIONS: driving, shopping, community activity, occupation, and yard work  PERSONAL FACTORS: Behavior pattern, Fitness, Past/current experiences, Social background, and Time since onset of injury/illness/exacerbation are also affecting patient's functional outcome.   REHAB POTENTIAL: Good  CLINICAL DECISION MAKING: Evolving/moderate complexity  EVALUATION  COMPLEXITY: Moderate   GOALS: Goals reviewed with patient? No  SHORT TERM GOALS: Target date: 04/20/2024   Will be compliant with appropriate progressive HEP  Baseline: Goal status: INITIAL  2.  Will be able to complete 5xSTS in 12 seconds  Baseline:  Goal status: INITIAL  3.  Will be able to ambulate household distances LRAD, Mod(I) Baseline:  Goal status: INITIAL  4.  Will be compliant with appropriate offloading schedule to assist in pressure relief/wound healing  Baseline:  Goal status: INITIAL  5.  Will be able to complete all bed mobility with Mod(I) Baseline:  Goal status: INITIAL    LONG TERM GOALS: Target date: 06/01/2024    MMT to have improved by at least 1 grade all weak groups  Baseline:  Goal status: INITIAL  2.  Will be able to ambulate at least 730ft with LRAD, Mod(I) to improve community access  Baseline:  Goal status: INITIAL  3.  Will score at least 45 on Berg  Baseline:  Goal status: INITIAL  4.  Will be able to navigate full flight of steps and rail, Mod(I) Baseline:  Goal status: INITIAL  5.  PSFS to have improved by at least 3 points  Baseline:  Goal status: INITIAL     PLAN:  PT FREQUENCY: 2x/week  PT DURATION: 12 weeks  PLANNED INTERVENTIONS: 97750- Physical Performance Testing, 97110-Therapeutic exercises, 97530- Therapeutic activity, 97112- Neuromuscular re-education, 97535- Self Care, [id number]- Manual therapy, [id number]- Gait training, and DME instructions  PLAN FOR NEXT SESSION: needs HEP, strength, functional transfers, functional activity tolerance, balance, f/u on ROJO PRN. CAUTION WITH NUSTEP/BIKE DUE TO SACRAL WOUND   Tanda Sorrow, PTA 03/21/24 2:26 PM

## 2024-03-22 ENCOUNTER — Encounter (HOSPITAL_BASED_OUTPATIENT_CLINIC_OR_DEPARTMENT_OTHER): Admitting: Internal Medicine

## 2024-03-22 DIAGNOSIS — C20 Malignant neoplasm of rectum: Secondary | ICD-10-CM | POA: Diagnosis not present

## 2024-03-22 DIAGNOSIS — K632 Fistula of intestine: Secondary | ICD-10-CM | POA: Diagnosis not present

## 2024-03-22 DIAGNOSIS — S31000A Unspecified open wound of lower back and pelvis without penetration into retroperitoneum, initial encounter: Secondary | ICD-10-CM | POA: Diagnosis not present

## 2024-03-22 DIAGNOSIS — S31809A Unspecified open wound of unspecified buttock, initial encounter: Secondary | ICD-10-CM | POA: Diagnosis not present

## 2024-03-22 DIAGNOSIS — N360 Urethral fistula: Secondary | ICD-10-CM | POA: Diagnosis not present

## 2024-03-22 DIAGNOSIS — X58XXXA Exposure to other specified factors, initial encounter: Secondary | ICD-10-CM | POA: Diagnosis not present

## 2024-03-23 ENCOUNTER — Encounter (HOSPITAL_BASED_OUTPATIENT_CLINIC_OR_DEPARTMENT_OTHER): Admitting: Internal Medicine

## 2024-03-23 ENCOUNTER — Inpatient Hospital Stay: Attending: Nurse Practitioner | Admitting: Nurse Practitioner

## 2024-03-23 ENCOUNTER — Encounter: Payer: Self-pay | Admitting: Nurse Practitioner

## 2024-03-23 ENCOUNTER — Ambulatory Visit: Admitting: Physical Therapy

## 2024-03-23 ENCOUNTER — Encounter: Payer: Self-pay | Admitting: Physical Therapy

## 2024-03-23 DIAGNOSIS — C20 Malignant neoplasm of rectum: Secondary | ICD-10-CM

## 2024-03-23 DIAGNOSIS — K5903 Drug induced constipation: Secondary | ICD-10-CM | POA: Diagnosis not present

## 2024-03-23 DIAGNOSIS — Z515 Encounter for palliative care: Secondary | ICD-10-CM | POA: Diagnosis not present

## 2024-03-23 DIAGNOSIS — S31000A Unspecified open wound of lower back and pelvis without penetration into retroperitoneum, initial encounter: Secondary | ICD-10-CM | POA: Diagnosis not present

## 2024-03-23 DIAGNOSIS — G893 Neoplasm related pain (acute) (chronic): Secondary | ICD-10-CM

## 2024-03-23 DIAGNOSIS — M6281 Muscle weakness (generalized): Secondary | ICD-10-CM

## 2024-03-23 DIAGNOSIS — R262 Difficulty in walking, not elsewhere classified: Secondary | ICD-10-CM | POA: Diagnosis not present

## 2024-03-23 DIAGNOSIS — R2681 Unsteadiness on feet: Secondary | ICD-10-CM

## 2024-03-23 DIAGNOSIS — E11622 Type 2 diabetes mellitus with other skin ulcer: Secondary | ICD-10-CM | POA: Diagnosis not present

## 2024-03-23 DIAGNOSIS — Z933 Colostomy status: Secondary | ICD-10-CM | POA: Diagnosis not present

## 2024-03-23 DIAGNOSIS — L598 Other specified disorders of the skin and subcutaneous tissue related to radiation: Secondary | ICD-10-CM | POA: Diagnosis not present

## 2024-03-23 NOTE — Therapy (Signed)
 OUTPATIENT PHYSICAL THERAPY LOWER EXTREMITY EVALUATION   Patient Name: Steven [name] MRN: [id number] DOB:August 23, 1971, 53 y.o., male Today's Date: 03/23/2024  END OF SESSION:  PT End of Session - 03/23/24 1437     Visit Number 4    Date for PT Re-Evaluation 06/01/24    PT Start Time 1430    PT Stop Time 1515    PT Time Calculation (min) 45 min    Activity Tolerance Patient tolerated treatment well;Patient limited by pain    Behavior During Therapy [facility] for tasks assessed/performed          Past Medical History:  Diagnosis Date   Anemia 02/2021   iron    Anxiety    Asthma    Bilateral pulmonary embolism (HCC) 04/24/2019   Dehiscence of anastomosis of large intestine 12/31/2022   Depression    Diabetes mellitus without complication (HCC)    History of kidney stones    HLD (hyperlipidemia)    Hypertension    Kidney stones    Neuromuscular disorder (HCC)    from radiation bi lat   OSA (obstructive sleep apnea)    CPAP   Peripheral vascular disease (HCC)    DVT   rectal ca 02/2021   Sleep apnea    Past Surgical History:  Procedure Laterality Date   APPENDECTOMY  1989   open appy   APPLICATION OF WOUND VAC N/A 12/30/2023   Procedure: APPLICATION, WOUND VAC;  Surgeon: Eletha Boas, MD;  Location: [facility];  Service: General;  Laterality: N/A;   BLADDER REPAIR  01/07/2024   Procedure: Closure of bladder cystotomy/fistula;  Surgeon: Elisabeth Valli BIRCH, MD;  Location: [facility];  Service: Urology;;   COLONOSCOPY  03/05/2021   CYST EXCISION  2011   left neck   DIVERTING ILEOSTOMY N/A 07/17/2021   Procedure: DIVERTING ILEOSTOMY;  Surgeon: Debby Hila, MD;  Location: [facility];  Service: General;  Laterality: N/A;   FLEXIBLE SIGMOIDOSCOPY N/A 04/04/2022   Procedure: FLEXIBLE SIGMOIDOSCOPY WITH POSSIBLE DEBRIDEMENT;  Surgeon: Debby Hila, MD;  Location: [facility];  Service: General;  Laterality: N/A;   INCISION AND DRAINAGE OF WOUND N/A 01/04/2024    Procedure: IRRIGATION AND DEBRIDEMENT WOUND; WOUND VAC REPLACEMENT;  Surgeon: Rubin Calamity, MD;  Location: [facility];  Service: General;  Laterality: N/A;   INCISION AND DRAINAGE PERIRECTAL ABSCESS N/A 12/16/2023   Procedure: DEBRIDEMENT OF POST OP WOUND;  Surgeon: Debby Hila, MD;  Location: [facility];  Service: General;  Laterality: N/A;  I & D PERIONEAL WOUND   IRRIGATION AND DEBRIDEMENT ABSCESS N/A 12/30/2023   Procedure: IRRIGATION AND DEBRIDEMENT ABSCESS;  Surgeon: Eletha Boas, MD;  Location: [facility];  Service: General;  Laterality: N/A;   IRRIGATION AND DEBRIDEMENT ABSCESS N/A 01/01/2024   Procedure: IRRIGATION AND DEBRIDEMENT ABSCESS;  Surgeon: Eletha Boas, MD;  [facility];  Service: General;  Laterality: N/A;   IRRIGATION AND DEBRIDEMENT ABSCESS N/A 01/07/2024   Procedure: IRRIGATION AND DEBRIDEMENT ABSCESS (N/A) - WOUND EXPLORATION AND DEBRIDEMENT.  CHANGE WOUND VAC DRESSING.  IRRIGATION AND DEBRIDEMENT OF ABDOMINAL WALL ABSCESS.;  Surgeon: Rubin Calamity, MD;  Location: [facility];  Service: General;  Laterality: N/A;   IRRIGATION AND DEBRIDEMENT ABSCESS N/A 01/11/2024   Procedure: EUA, WOUND VAC CHANGE, EXCISIONAL DEBRIDEMENT OF TISSUE,  MUSCLE AND PERINIEUM.;  Surgeon: Tanda Locus, MD;  Location: [facility];  Service: General;  Laterality: N/A;   RECTAL EXAM UNDER ANESTHESIA N/A 04/04/2022   Procedure: ANAL EXAM UNDER ANESTHESIA;  Surgeon: Debby Hila, MD;  Location: [facility];  Service: General;  Laterality: N/A;   RECTAL EXAM UNDER ANESTHESIA N/A 12/25/2023   Procedure: EXAM UNDER ANESTHESIA, RECTUM;  Surgeon: Vanderbilt Ned, MD;  Location[facility];  Service: General;  Laterality: N/A;   RECTOPEXY N/A 12/02/2023   Procedure: Perineal Proctatectomy;  Surgeon: Ned Hila, MD;  Location[facility];  Service: General;  Laterality: N/A;  Completion Proctatectomy   WISDOM TOOTH EXTRACTION     age 43   XI ROBOTIC ASSISTED LOWER ANTERIOR RESECTION N/A 07/17/2021    Procedure: XI ROBOTIC ASSISTED LOWER ANTERIOR RESECTION;  Surgeon: Ned Hila, MD;  Location[facility];  Service: General;  Laterality: N/A;   XI ROBOTIC ASSISTED LOWER ANTERIOR RESECTION N/A 12/31/2022   Procedure: ROBOTIC ASSISTED LOWER ANTERIOR RESECTION WITH OSTOMY with intraop ICG guided perfusion, takedown of iliostomy;  Surgeon: Ned Hila, MD;  Location: [facility];  Service: General;  Laterality: N/A;   Patient Active Problem List   Diagnosis Date Noted   Symptomatic anemia 03/03/2024   Goals of care, counseling/discussion 01/10/2024   Fistula 01/09/2024   History of rectal cancer 01/09/2024   Disruption of perineal wound in male 01/06/2024   Pain 01/06/2024   Post-operative hemoglobin drop 01/05/2024   High risk medication use 01/05/2024   Need for emotional support 01/05/2024   Counseling and coordination of care 01/05/2024   Medication management 01/05/2024   Palliative care encounter 01/05/2024   Anemia due to acute blood loss 12/25/2023   Necrosis of surgical wound (HCC) 12/15/2023   AKI (acute kidney injury) (HCC) 11/30/2023   Chronic pelvic abscess due to rectal stump disruption 11/29/2023   Parastomal hernia without obstruction or gangrene 11/29/2023   Colostomy in place [facility]) 11/29/2023   History of pulmonary embolus (PE) 11/29/2023   Chronic anticoagulation 11/29/2023   Intraperitoneal abscess (HCC) 11/29/2023   Peripheral neuropathy due to chemotherapy (HCC) 07/07/2022   Cancer related pain 04/30/2021   Tenesmus 04/16/2021   Chemotherapy-induced nausea 04/03/2021   Adenocarcinoma of rectum (HCC) 03/21/2021   Sensorineural hearing loss (SNHL) of both ears 01/21/2021   Gastroesophageal reflux disease 12/26/2020   Subjective hearing loss 12/18/2020   Uncontrolled diabetes mellitus with hyperglycemia (HCC) 04/26/2019   Obesity (BMI 30-39.9) 04/26/2019   Asthma    Anxiety    OSA (obstructive sleep apnea)    HLD (hyperlipidemia)     PCP: Arloa Fallow MD    REFERRING PROVIDER: Thresa Marylynn Phi, NP  REFERRING DIAG: [phone] (ICD-10-CM) - Non-pressure chronic ulcer of back with unspecified severity R26.2 (ICD-10-CM) - Difficulty in walking, not elsewhere classified  THERAPY DIAG:  No diagnosis found.  Rationale for Evaluation and Treatment: Rehabilitation  ONSET DATE: March 2025 (surgeries), cancer started 2022  SUBJECTIVE:   SUBJECTIVE STATEMENT: Im here   I spent 2-3 months in the hospital laying in bed not able to really get up. Have a hx of rectal cancer with multiple surgeries to remove this and additional tissues, then had a blockage and ended up back in the hospital and more surgeries. At the end of this got some AIR at [facility] but it didn't add up to much, they weren't that aggressive. It was difficult to get around after getting home, now able to stand and transfer. Getting hyper[facility]ic therapy at [facility]. Using a waffle cushion in the chair now for wound/pressure relief, have to shift a lot. Need to build strength and stamina in LEs.   PERTINENT HISTORY: See above  PAIN:  Are you having pain? Yes: NPRS  scale: 3/10 Pain location: wound  Pain description: pressure/sharp  Aggravating factors: pressure Relieving factors: offloading   PRECAUTIONS: Other: suprapubic cath, fistula in urethra, sacral wound, fall risk, colostomy, PICC  RED FLAGS: None   WEIGHT BEARING RESTRICTIONS: No  FALLS:  Has patient fallen in last 6 months? Yes. Number of falls 1 trying to get to bathroom, lower to ground situation with spouse   LIVING ENVIRONMENT: Lives with: lives with their spouse Lives in: House/apartment Stairs: stays on first floor/does not go upstairs for now  Has following equipment at home: Environmental consultant - 2 wheeled, Wheelchair (manual), Grab bars, and Ramped entry  OCCUPATION: software develop- now on medical leave   PLOF: Independent, Independent with basic ADLs, Independent with gait, and Independent with transfers  PATIENT GOALS:  improve strength, walk, improving balance, get back to PLOF   NEXT MD VISIT: PCP PRN   OBJECTIVE:  Note: Objective measures were completed at Evaluation unless otherwise noted.    PATIENT SURVEYS:    Patient-Specific Activity Scoring Scheme  0 represents "unable to perform." 10 represents "able to perform at prior level. 0 1 2 3 4 5 6 7 8 9  10 (Date and Score)   Activity Eval     1. Walking   4    2. Steps  0     3.     4.    5.    Score 2    Total score = sum of the activity scores/number of activities Minimum detectable change (90%CI) for average score = 2 points Minimum detectable change (90%CI) for single activity score = 3 points     COGNITION: Overall cognitive status: Within functional limits for tasks assessed     SENSATION: Not tested      LOWER EXTREMITY MMT:  MMT Right eval Left eval  Hip flexion 3+ 3+  Hip extension    Hip abduction    Hip adduction    Hip internal rotation    Hip external rotation    Knee flexion 4 4  Knee extension 3- 4  Ankle dorsiflexion 3 3  Ankle plantarflexion    Ankle inversion    Ankle eversion     (Blank rows = not tested)    FUNCTIONAL TESTS:  5 times sit to stand: 18.7 seconds use of UEs   GAIT: Distance walked: 163ft Assistive device utilized: Environmental consultant - 2 wheeled Level of assistance: CGA with WC follow  Comments: slow but                                                                                                                                 TREATMENT DATE:  03/23/24 NuStep L 3 x 3 min stopped due to wound  pain Seated stretching reaching to the floor Sit to stand form elevated surface, progressive lowering Gait no AD 56ft some instability 4in step ups in RW 2x5  HS Curls green 2x15 LAQ 3lb 2x10 3lb  standing march in RW x5   03/21/24 NuStep L 3 x 3 min stopped due to wound  Sit to stand 2x10 elevated surface Standing OHP yellow wball 2x10 Standing in HS curls 2lb 2x10 LAQ 2lb  2x10 Standing reaching outside base of support   On airex x10  Side steps at mat table  03/09/24  Eval, POC, education on wound off loading (lateral shifts in WC, WC pushups every 10-15 minutes), walking program at home with family, expected progression given PLOF and prior health, discussion ROJO cushion and recommended they ask wound MD about this tomorrow    PATIENT EDUCATION:  Education details: as above  Person educated: Patient and Spouse Education method: Explanation Education comprehension: verbalized understanding and needs further education  HOME EXERCISE PROGRAM:   3rd session   ASSESSMENT:  CLINICAL IMPRESSION: Patient is a 53 y.o. M who was seen today for physical therapy evaluation and treatment for L98.429 (ICD-10-CM) - Non-pressure chronic ulcer of back with unspecified severity R26.2 (ICD-10-CM) - Difficulty in walking, not elsewhere classified. He does have quite a complex medical hx with multiple surgeries and a short course of AIR at [facility]. Pt enters with hs wife motivated. Again effort spent on trying to prevent any prolong sitting due to pressure relief on wound. Pt able to progress to step ups with RW and gait without RW . Visible LE shaking with sit to stands and LAQ. Decrease TKE also with LAQ. Will make every effort to improve level of function.    OBJECTIVE IMPAIRMENTS: Abnormal gait, cardiopulmonary status limiting activity, decreased activity tolerance, decreased balance, decreased coordination, decreased knowledge of use of DME, decreased mobility, difficulty walking, decreased strength, and pain.   ACTIVITY LIMITATIONS: sitting, standing, squatting, stairs, transfers, and locomotion level  PARTICIPATION LIMITATIONS: driving, shopping, community activity, occupation, and yard work  PERSONAL FACTORS: Behavior pattern, Fitness, Past/current experiences, Social background, and Time since onset of injury/illness/exacerbation are also affecting patient's  functional outcome.   REHAB POTENTIAL: Good  CLINICAL DECISION MAKING: Evolving/moderate complexity  EVALUATION COMPLEXITY: Moderate   GOALS: Goals reviewed with patient? No  SHORT TERM GOALS: Target date: 04/20/2024   Will be compliant with appropriate progressive HEP  Baseline: Goal status: INITIAL  2.  Will be able to complete 5xSTS in 12 seconds  Baseline:  Goal status: INITIAL  3.  Will be able to ambulate household distances LRAD, Mod(I) Baseline:  Goal status: INITIAL  4.  Will be compliant with appropriate offloading schedule to assist in pressure relief/wound healing  Baseline:  Goal status: INITIAL  5.  Will be able to complete all bed mobility with Mod(I) Baseline:  Goal status: INITIAL    LONG TERM GOALS: Target date: 06/01/2024    MMT to have improved by at least 1 grade all weak groups  Baseline:  Goal status: INITIAL  2.  Will be able to ambulate at least 748ft with LRAD, Mod(I) to improve community access  Baseline:  Goal status: INITIAL  3.  Will score at least 45 on Berg  Baseline:  Goal status: INITIAL  4.  Will be able to navigate full flight of steps and rail, Mod(I) Baseline:  Goal status: INITIAL  5.  PSFS to have improved by at least 3 points  Baseline:  Goal status: INITIAL     PLAN:  PT FREQUENCY: 2x/week  PT DURATION: 12 weeks  PLANNED INTERVENTIONS: 97750- Physical Performance Testing, 97110-Therapeutic exercises, 97530- Therapeutic activity, [id number]- Neuromuscular re-education, 97535- Self Care, [id number]- Manual therapy, [id number]- Gait training,  and DME instructions  PLAN FOR NEXT SESSION: needs HEP, strength, functional transfers, functional activity tolerance, balance, f/u on ROJO PRN. CAUTION WITH NUSTEP/BIKE DUE TO SACRAL WOUND   Tanda Sorrow, PTA 03/23/24 2:38 PM

## 2024-03-23 NOTE — Progress Notes (Signed)
 Palliative Medicine [facility]  [phone] [phone]   Name: Steven [name] Date: 03/23/2024 MRN: [id number]  DOB: [birth year]  Patient Care Team: Arloa Elsie SAUNDERS, MD as PCP - General (Family Medicine) Loretha Ash, MD as Consulting Physician (Hematology and Oncology) Debby Hila, MD as Consulting Physician (General Surgery) Causey, Morna Pickle, NP as Nurse Practitioner (Hematology and Oncology) Dewey Rush, MD as Consulting Physician (Radiation Oncology) Mansouraty, Aloha Raddle., MD as Consulting Physician (Gastroenterology) Elisabeth Valli BIRCH, MD as Consulting Physician (Urology) Pickenpack-Cousar, Fannie SAILOR, NP as Nurse Practitioner [facility] and Palliative Medicine)   I connected with Steven [name] on 03/23/24 at  4:00 PM EDT by telephone and verified that I am speaking with the correct person using two identifiers.   I discussed the limitations, risks, security and privacy concerns of performing an evaluation and management service by telemedicine and the availability of in-person appointments. I also discussed with the patient that there may be a patient responsible charge related to this service. The patient expressed understanding and agreed to proceed.   Other persons participating in the visit and their role in the encounter: N/A   Patient's location: Home   Provider's location: [facility]   INTERVAL HISTORY: Steven [name] is a 53 y.o. male with oncologic medical history including stage II rectal adenocarcinoma s/p chemoradiation (2022), resection with ostomy, chronic pelvic abscess.  Palliative is seeing patient for symptom management and goals of care.   SOCIAL HISTORY:     reports that he has never smoked. He has never used smokeless tobacco. He reports current alcohol use. He reports that he does not use drugs.  ADVANCE DIRECTIVES:  None on file   CODE STATUS: Full code  PAST MEDICAL HISTORY: Past Medical History:   Diagnosis Date   Anemia 02/2021   iron    Anxiety    Asthma    Bilateral pulmonary embolism (HCC) 04/24/2019   Dehiscence of anastomosis of large intestine 12/31/2022   Depression    Diabetes mellitus without complication (HCC)    History of kidney stones    HLD (hyperlipidemia)    Hypertension    Kidney stones    Neuromuscular disorder (HCC)    from radiation bi lat   OSA (obstructive sleep apnea)    CPAP   Peripheral vascular disease (HCC)    DVT   rectal ca 02/2021   Sleep apnea     ALLERGIES:  is allergic to contrast media [iodinated contrast media].  MEDICATIONS:  Current Outpatient Medications  Medication Sig Dispense Refill   albuterol  (PROAIR  HFA) 108 (90 Base) MCG/ACT inhaler Inhale 2 puffs into the lungs every 4 (four) hours as needed for wheezing or shortness of breath.     ALPRAZolam  (XANAX ) 0.5 MG tablet Take 0.5 mg by mouth 2 (two) times daily as needed for anxiety or sleep.     calcium  carbonate (TUMS - DOSED IN MG ELEMENTAL CALCIUM ) 500 MG chewable tablet Chew 2 tablets by mouth daily as needed for indigestion or heartburn.     cyclobenzaprine  (FLEXERIL ) 10 MG tablet Take 1 tablet (10 mg total) by mouth 3 (three) times daily as needed for muscle spasms. (Patient taking differently: Take 10 mg by mouth 3 (three) times daily as needed for muscle spasms. Take 10 mg by mouth at 12 Midnight, 8 AM, and 4 PM) 60 tablet 3   diphenhydrAMINE  (BENADRYL ) 25 mg capsule Take 1 capsule (25 mg total) by mouth every 6 (six) hours as needed for itching. (Patient  taking differently: Take 25 mg by mouth See admin instructions. Take 25-50 mg by mouth as directed prior to receiving contrast dye as a part of the Benadryl  + Prednisone  pre-treatment regimen)     escitalopram  (LEXAPRO ) 10 MG tablet Take 1 tablet (10 mg total) by mouth at bedtime. (Patient taking differently: Take 10 mg by mouth daily at 6 PM.) 30 tablet 3   ferrous sulfate  325 (65 FE) MG tablet Take 1 tablet (325 mg total)  by mouth 2 (two) times daily with a meal. (Patient not taking: Reported on 03/03/2024)     Gauze Pads & Dressings (COMBINE ABD) 5X9 PADS 1 Units by Does not apply route daily. 30 each 3   Gauze Pads & Dressings (KERLIX GAUZE ROLL LARGE) MISC 1 Units by Does not apply route daily. 30 each 3   morphine  (MS CONTIN ) 60 MG 12 hr tablet Take 1 tablet (60 mg total) by mouth every 12 (twelve) hours. 60 tablet 0   morphine  (MSIR) 15 MG tablet Take 0.5-1 tablets (7.5-15 mg total) by mouth every 4 (four) hours as needed for moderate pain (pain score 4-6) or severe pain (pain score 7-10). 90 tablet 0   PRESCRIPTION MEDICATION See admin instructions. TPN from [facility] (scanned into the Media tab in Epic by Registration as a document on 03/03/2024) Beginning at 8-9 PM for twelve hours     rivaroxaban  (XARELTO ) 20 MG TABS tablet Take 1 tablet (20 mg total) by mouth daily with supper. (Patient taking differently: Take 20 mg by mouth See admin instructions. Take 20 mg by mouth at 8 AM) 90 tablet 3   Simethicone  (GAS-X PO) Take 1-2 tablets by mouth as needed (gas).     tetrahydrozoline-zinc  (VISINE-AC) 0.05-0.25 % ophthalmic solution Place 2 drops into both eyes 3 (three) times daily as needed (for allergies).     TYLENOL  500 MG tablet Take 500-1,000 mg by mouth See admin instructions. Take 1,000 mg by mouth at 6 AM, 10 AM, and 6 PM AND 500 mg at 2 PM     No current facility-administered medications for this visit.    VITAL SIGNS: There were no vitals taken for this visit. There were no vitals filed for this visit.  Estimated body mass index is 28.47 kg/m as calculated from the following:   Height as of 03/04/24: 5' 10 (1.778 m).   Weight as of 03/04/24: 198 lb 6.6 oz (90 kg).   PERFORMANCE STATUS (ECOG) : 1 - Symptomatic but completely ambulatory  IMPRESSION: Discussed the use of AI scribe software for clinical note transcription with the patient, who gave verbal consent to proceed.  History of Present  Illness I connected by phone with Steven [name] for symptom management follow-up. He is doing well overall. No acute distress. Denies concerns for nausea, vomiting, constipation, or diarrhea.  States some concern with more formed bowel movements requiring him to change out his colostomy bag versus emptying. Education provided on bowel regimen. Recommended daily Miralax  for bowel regimen to prevent firmer stools. He verbalized understanding. If bowels loosen may consider every other day use.   Patient reports his pain is well-controlled on current regimen.  He is currently taking MS Contin  every 12 hours in addition to MS IR every 4 hours as needed for breakthrough pain. Flexeril  as needed for muscle spasms.  No adjustments to current regimen at this time.  We will continue to closely monitor and support.  Goals of Care 02/11/24: We discussed his current illness and what  it means in the larger context of ihs on-going co-morbidities. Natural disease trajectory and expectations were discussed. Steven [name] and his wife are realistic in their understanding of current conditions and plan of care. He is remaining hopeful for stability and improvement in the near future. He is emotional expressing his decreased quality of life and health challenges. Continues to take things one day at a time as his goals are clear to continue to treat the treatable aggressively allowing him every opportunity to thrive while managing symptoms.   I discussed the importance of continued conversation with family and their medical providers regarding overall plan of care and treatment options, ensuring decisions are within the context of the patients values and GOCs. Assessment & Plan Muscle Spasms He tolerates flexeril  well, compared to robaxin . With effective management of spasms.  Cancer Related pain management Pain from post-surgical wound managed with morphine . Pain controlled but not fully alleviated. Side effects include  somnolence and diaphoresis.  - Continue morphine  extended release 60 mg every 12 hours. - Continue morphine  immediate release 15 mg every 4 hours as needed. - Refill prescriptions for morphine  and other medications. -Flexeril  10mg  every 8 hours as needed.    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 TPN administered at night. Home health assists with TPN management. - Continue TPN administration at night for 12 hours. - Coordinate with home health for TPN management and dressing changes.   Depression Depression managed with Lexapro . No current issues reported. - Continue Lexapro  10 mg at bedtime. - Refill Lexapro  prescription.  Follow-up Follow-up planned to reassess management and address medication needs. - Schedule follow-up in 4-6 weeks. Sooner if needed.  - Contact provider if refills needed before appointment.  Patient expressed understanding and was in agreement with this plan. He also understands that He can call the clinic at any time with any questions, concerns, or complaints.   Any controlled substances utilized were prescribed in the context of palliative care. PDMP has been reviewed.   I provided 25 minutes of non face-to-face telephone visit time during this encounter, and > 50% was spent counseling as documented under my assessment & plan. Visit consisted of counseling and education dealing with the complex and emotionally intense issues of symptom management and palliative care in the setting of serious and potentially life-threatening illness.  Levon Borer, AGPCNP-BC  Palliative Medicine Team/[facility]

## 2024-03-24 ENCOUNTER — Encounter (HOSPITAL_BASED_OUTPATIENT_CLINIC_OR_DEPARTMENT_OTHER): Admitting: Internal Medicine

## 2024-03-24 DIAGNOSIS — Z933 Colostomy status: Secondary | ICD-10-CM | POA: Diagnosis not present

## 2024-03-24 DIAGNOSIS — C20 Malignant neoplasm of rectum: Secondary | ICD-10-CM

## 2024-03-24 DIAGNOSIS — S31000A Unspecified open wound of lower back and pelvis without penetration into retroperitoneum, initial encounter: Secondary | ICD-10-CM | POA: Diagnosis not present

## 2024-03-24 DIAGNOSIS — E11622 Type 2 diabetes mellitus with other skin ulcer: Secondary | ICD-10-CM | POA: Diagnosis not present

## 2024-03-24 DIAGNOSIS — L598 Other specified disorders of the skin and subcutaneous tissue related to radiation: Secondary | ICD-10-CM | POA: Diagnosis not present

## 2024-03-24 LAB — GLUCOSE, CAPILLARY
Glucose-Capillary: 192 mg/dL — ABNORMAL HIGH (ref 70–99)
Glucose-Capillary: 186 mg/dL — ABNORMAL HIGH (ref 70–99)

## 2024-03-25 ENCOUNTER — Encounter (HOSPITAL_BASED_OUTPATIENT_CLINIC_OR_DEPARTMENT_OTHER): Admitting: Internal Medicine

## 2024-03-25 DIAGNOSIS — L598 Other specified disorders of the skin and subcutaneous tissue related to radiation: Secondary | ICD-10-CM | POA: Diagnosis not present

## 2024-03-25 DIAGNOSIS — C20 Malignant neoplasm of rectum: Secondary | ICD-10-CM | POA: Diagnosis not present

## 2024-03-25 DIAGNOSIS — S31000A Unspecified open wound of lower back and pelvis without penetration into retroperitoneum, initial encounter: Secondary | ICD-10-CM

## 2024-03-25 DIAGNOSIS — Z933 Colostomy status: Secondary | ICD-10-CM | POA: Diagnosis not present

## 2024-03-25 DIAGNOSIS — E11622 Type 2 diabetes mellitus with other skin ulcer: Secondary | ICD-10-CM | POA: Diagnosis not present

## 2024-03-25 LAB — GLUCOSE, CAPILLARY
Glucose-Capillary: 168 mg/dL — ABNORMAL HIGH (ref 70–99)
Glucose-Capillary: 170 mg/dL — ABNORMAL HIGH (ref 70–99)

## 2024-03-26 DIAGNOSIS — C20 Malignant neoplasm of rectum: Secondary | ICD-10-CM | POA: Diagnosis not present

## 2024-03-27 DIAGNOSIS — C20 Malignant neoplasm of rectum: Secondary | ICD-10-CM | POA: Diagnosis not present

## 2024-03-28 ENCOUNTER — Encounter: Payer: Self-pay | Admitting: Physical Therapy

## 2024-03-28 ENCOUNTER — Ambulatory Visit: Admitting: Physical Therapy

## 2024-03-28 ENCOUNTER — Encounter (HOSPITAL_BASED_OUTPATIENT_CLINIC_OR_DEPARTMENT_OTHER): Admitting: Internal Medicine

## 2024-03-28 DIAGNOSIS — M6281 Muscle weakness (generalized): Secondary | ICD-10-CM | POA: Diagnosis not present

## 2024-03-28 DIAGNOSIS — E11622 Type 2 diabetes mellitus with other skin ulcer: Secondary | ICD-10-CM

## 2024-03-28 DIAGNOSIS — C20 Malignant neoplasm of rectum: Secondary | ICD-10-CM | POA: Diagnosis not present

## 2024-03-28 DIAGNOSIS — R262 Difficulty in walking, not elsewhere classified: Secondary | ICD-10-CM | POA: Diagnosis not present

## 2024-03-28 DIAGNOSIS — S31000A Unspecified open wound of lower back and pelvis without penetration into retroperitoneum, initial encounter: Secondary | ICD-10-CM

## 2024-03-28 DIAGNOSIS — R2681 Unsteadiness on feet: Secondary | ICD-10-CM

## 2024-03-28 DIAGNOSIS — Z933 Colostomy status: Secondary | ICD-10-CM | POA: Diagnosis not present

## 2024-03-28 DIAGNOSIS — L598 Other specified disorders of the skin and subcutaneous tissue related to radiation: Secondary | ICD-10-CM | POA: Diagnosis not present

## 2024-03-28 LAB — GLUCOSE, CAPILLARY: Glucose-Capillary: 156 mg/dL — ABNORMAL HIGH (ref 70–99)

## 2024-03-28 NOTE — Therapy (Signed)
 OUTPATIENT PHYSICAL THERAPY LOWER EXTREMITY TREATMENT    Patient Name: Steven [name] MRN: [id number] DOB:[birth year], 53 y.o., male Today's Date: 03/28/2024  END OF SESSION:  PT End of Session - 03/28/24 1511     Visit Number 5    Number of Visits 25    Date for PT Re-Evaluation 06/01/24    Authorization Type [employer]    Authorization Time Period 03/09/24 to 06/01/24    Authorization - Number of Visits 60    PT Start Time 1432    PT Stop Time 1510    PT Time Calculation (min) 38 min    Activity Tolerance Patient tolerated treatment well;Patient limited by fatigue    Behavior During Therapy [facility] for tasks assessed/performed           Past Medical History:  Diagnosis Date   Anemia 02/2021   iron    Anxiety    Asthma    Bilateral pulmonary embolism (HCC) 04/24/2019   Dehiscence of anastomosis of large intestine 12/31/2022   Depression    Diabetes mellitus without complication (HCC)    History of kidney stones    HLD (hyperlipidemia)    Hypertension    Kidney stones    Neuromuscular disorder (HCC)    from radiation bi lat   OSA (obstructive sleep apnea)    CPAP   Peripheral vascular disease (HCC)    DVT   rectal ca 02/2021   Sleep apnea    Past Surgical History:  Procedure Laterality Date   APPENDECTOMY  1989   open appy   APPLICATION OF WOUND VAC N/A 12/30/2023   Procedure: APPLICATION, WOUND VAC;  Surgeon: Eletha Boas, MD;  Location: [facility];  Service: General;  Laterality: N/A;   BLADDER REPAIR  01/07/2024   Procedure: Closure of bladder cystotomy/fistula;  Surgeon: Elisabeth Valli BIRCH, MD;  Location: [facility];  Service: Urology;;   COLONOSCOPY  03/05/2021   CYST EXCISION  2011   left neck   DIVERTING ILEOSTOMY N/A 07/17/2021   Procedure: DIVERTING ILEOSTOMY;  Surgeon: Debby Hila, MD;  Location: [facility];  Service: General;  Laterality: N/A;   FLEXIBLE SIGMOIDOSCOPY N/A 04/04/2022   Procedure: FLEXIBLE SIGMOIDOSCOPY WITH POSSIBLE DEBRIDEMENT;  Surgeon: Debby Hila, MD;  Location: [facility];  Service: General;  Laterality: N/A;   INCISION AND DRAINAGE OF WOUND N/A 01/04/2024   Procedure: IRRIGATION AND DEBRIDEMENT WOUND; WOUND VAC REPLACEMENT;  Surgeon: Rubin Calamity, MD;  Location: [facility];  Service: General;  Laterality: N/A;   INCISION AND DRAINAGE PERIRECTAL ABSCESS N/A 12/16/2023   Procedure: DEBRIDEMENT OF POST OP WOUND;  Surgeon: Debby Hila, MD;  Location: [facility];  Service: General;  Laterality: N/A;  I & D PERIONEAL WOUND   IRRIGATION AND DEBRIDEMENT ABSCESS N/A 12/30/2023   Procedure: IRRIGATION AND DEBRIDEMENT ABSCESS;  Surgeon: Eletha Boas, MD;  Location: [facility];  Service: General;  Laterality: N/A;   IRRIGATION AND DEBRIDEMENT ABSCESS N/A 01/01/2024   Procedure: IRRIGATION AND DEBRIDEMENT ABSCESS;  Surgeon: Eletha Boas, MD;  Location: [facility];  Service: General;  Laterality: N/A;   IRRIGATION AND DEBRIDEMENT ABSCESS N/A 01/07/2024   Procedure: IRRIGATION AND DEBRIDEMENT ABSCESS (N/A) - WOUND EXPLORATION AND DEBRIDEMENT.  CHANGE WOUND VAC DRESSING.  IRRIGATION AND DEBRIDEMENT OF ABDOMINAL WALL ABSCESS.;  Surgeon: Rubin Calamity, MD;  Location: [facility];  Service: General;  Laterality: N/A;   IRRIGATION AND DEBRIDEMENT ABSCESS N/A 01/11/2024   Procedure: EUA, WOUND VAC CHANGE, EXCISIONAL DEBRIDEMENT OF TISSUE,  MUSCLE AND PERINIEUM.;  Surgeon: Tanda,  Camellia, MD;  Location: [facility];  Service: General;  Laterality: N/A;   RECTAL EXAM UNDER ANESTHESIA N/A 04/04/2022   Procedure: ANAL EXAM UNDER ANESTHESIA;  Surgeon: Debby Hila, MD;  Location: [facility];  Service: General;  Laterality: N/A;   RECTAL EXAM UNDER ANESTHESIA N/A 12/25/2023   Procedure: EXAM UNDER ANESTHESIA, RECTUM;  Surgeon: Vanderbilt Debby, MD;  Location: [facility];  Service: General;  Laterality: N/A;   RECTOPEXY N/A 12/02/2023   Procedure: Perineal Proctatectomy;  Surgeon: Debby Hila, MD;  Location: [facility];  Service: General;   Laterality: N/A;  Completion Proctatectomy   WISDOM TOOTH EXTRACTION     age 73   XI ROBOTIC ASSISTED LOWER ANTERIOR RESECTION N/A 07/17/2021   Procedure: XI ROBOTIC ASSISTED LOWER ANTERIOR RESECTION;  Surgeon: Debby Hila, MD;  Location: [facility];  Service: General;  Laterality: N/A;   XI ROBOTIC ASSISTED LOWER ANTERIOR RESECTION N/A 12/31/2022   Procedure: ROBOTIC ASSISTED LOWER ANTERIOR RESECTION WITH OSTOMY with intraop ICG guided perfusion, takedown of iliostomy;  Surgeon: Debby Hila, MD;  Location: [facility];  Service: General;  Laterality: N/A;   Patient Active Problem List   Diagnosis Date Noted   Symptomatic anemia 03/03/2024   Goals of care, counseling/discussion 01/10/2024   Fistula 01/09/2024   History of rectal cancer 01/09/2024   Disruption of perineal wound in male 01/06/2024   Pain 01/06/2024   Post-operative hemoglobin drop 01/05/2024   High risk medication use 01/05/2024   Need for emotional support 01/05/2024   Counseling and coordination of care 01/05/2024   Medication management 01/05/2024   Palliative care encounter 01/05/2024   Anemia due to acute blood loss 12/25/2023   Necrosis of surgical wound (HCC) 12/15/2023   AKI (acute kidney injury) (HCC) 11/30/2023   Chronic pelvic abscess due to rectal stump disruption 11/29/2023   Parastomal hernia without obstruction or gangrene 11/29/2023   Colostomy in place [facility]) 11/29/2023   History of pulmonary embolus (PE) 11/29/2023   Chronic anticoagulation 11/29/2023   Intraperitoneal abscess (HCC) 11/29/2023   Peripheral neuropathy due to chemotherapy (HCC) 07/07/2022   Cancer related pain 04/30/2021   Tenesmus 04/16/2021   Chemotherapy-induced nausea 04/03/2021   Adenocarcinoma of rectum (HCC) 03/21/2021   Sensorineural hearing loss (SNHL) of both ears 01/21/2021   Gastroesophageal reflux disease 12/26/2020   Subjective hearing loss 12/18/2020   Uncontrolled diabetes mellitus with hyperglycemia (HCC) 04/26/2019    Obesity (BMI 30-39.9) 04/26/2019   Asthma    Anxiety    OSA (obstructive sleep apnea)    HLD (hyperlipidemia)     PCP: Arloa Fallow MD   REFERRING PROVIDER: Thresa Marylynn Phi, NP  REFERRING DIAG: [phone] (ICD-10-CM) - Non-pressure chronic ulcer of back with unspecified severity R26.2 (ICD-10-CM) - Difficulty in walking, not elsewhere classified  THERAPY DIAG:  Muscle weakness (generalized)  Difficulty in walking, not elsewhere classified  Unsteadiness on feet  Rationale for Evaluation and Treatment: Rehabilitation  ONSET DATE: March 2025 (surgeries), cancer started 2022  SUBJECTIVE:   SUBJECTIVE STATEMENT:  Doing OK today, calves have been really tight in the mornings. I have a ramp at home but the other day I decided to take a shortcut and going up the 3 steps isn't too bad but coming down is still really hard, almost fell     EVAL:  I spent 2-3 months in the hospital laying in bed not able to really get up. Have a hx of rectal cancer with multiple surgeries to remove this and additional tissues, then had  a blockage and ended up back in the hospital and more surgeries. At the end of this got some AIR at [facility] but it didn't add up to much, they weren't that aggressive. It was difficult to get around after getting home, now able to stand and transfer. Getting hyperbaric therapy at [facility]. Using a waffle cushion in the chair now for wound/pressure relief, have to shift a lot. Need to build strength and stamina in LEs.   PERTINENT HISTORY: See above  PAIN:  Are you having pain? Yes: NPRS scale: normal soreness in wound area  Pain location: wound  Pain description: pressure/sharp  Aggravating factors: pressure Relieving factors: offloading   PRECAUTIONS: Other: suprapubic cath, fistula in urethra, sacral wound, fall risk, colostomy, PICC  RED FLAGS: None   WEIGHT BEARING RESTRICTIONS: No  FALLS:  Has patient fallen in last 6 months? Yes. Number of falls 1 trying  to get to bathroom, lower to ground situation with spouse   LIVING ENVIRONMENT: Lives with: lives with their spouse Lives in: House/apartment Stairs: stays on first floor/does not go upstairs for now  Has following equipment at home: Environmental consultant - 2 wheeled, Wheelchair (manual), Grab bars, and Ramped entry  OCCUPATION: software develop- now on medical leave   PLOF: Independent, Independent with basic ADLs, Independent with gait, and Independent with transfers  PATIENT GOALS: improve strength, walk, improving balance, get back to PLOF   NEXT MD VISIT: PCP PRN   OBJECTIVE:  Note: Objective measures were completed at Evaluation unless otherwise noted.    PATIENT SURVEYS:    Patient-Specific Activity Scoring Scheme  0 represents "unable to perform." 10 represents "able to perform at prior level. 0 1 2 3 4 5 6 7 8 9  10 (Date and Score)   Activity Eval     1. Walking   4    2. Steps  0     3.     4.    5.    Score 2    Total score = sum of the activity scores/number of activities Minimum detectable change (90%CI) for average score = 2 points Minimum detectable change (90%CI) for single activity score = 3 points     COGNITION: Overall cognitive status: Within functional limits for tasks assessed     SENSATION: Not tested      LOWER EXTREMITY MMT:  MMT Right eval Left eval  Hip flexion 3+ 3+  Hip extension    Hip abduction    Hip adduction    Hip internal rotation    Hip external rotation    Knee flexion 4 4  Knee extension 3- 4  Ankle dorsiflexion 3 3  Ankle plantarflexion    Ankle inversion    Ankle eversion     (Blank rows = not tested)    FUNCTIONAL TESTS:  5 times sit to stand: 18.7 seconds use of UEs   GAIT: Distance walked: 168ft Assistive device utilized: Walker - 2 wheeled Level of assistance: CGA with WC follow  Comments: slow but  TREATMENT DATE:    03/28/24  Tandem stance solid surface 3x30 seconds B  Tandem walking x4 laps in // bars  Forward lunges onto BOSU cues for forward wt shift x10 B  Standing hip ABD red TB 2x10 B Attempted goblet STS but unable with 5# and unweighted from standard chair  Walking without walker, min guard for functional activity tolerance 177ftx2     03/23/24 NuStep L 3 x 3 min stopped due to wound  pain Seated stretching reaching to the floor Sit to stand form elevated surface, progressive lowering Gait no AD 39ft some instability 4in step ups in RW 2x5  HS Curls green 2x15 LAQ 3lb 2x10 3lb standing march in RW x5      PATIENT EDUCATION:  Education details: as above  Person educated: Patient and Spouse Education method: Explanation Education comprehension: verbalized understanding and needs further education  HOME EXERCISE PROGRAM:   Access Code: [email or web address] URL: [email or web address] Date: 03/28/2024 Prepared by: Josette Rough  Exercises - Tandem Stance in Corner  - 1 x daily - 7 x weekly - 1-2 sets - 6 reps - 30 seconds  hold - Tandem Walking with Counter Support  - 1 x daily - 7 x weekly - 1-2 sets - 4-5 reps - Standing Hip Abduction with Resistance at Thighs  - 1 x daily - 5 x weekly - 1-2 sets - 10 reps - Sit to Stand with Armchair  - 1 x daily - 5 x weekly - 1-2 sets - 5-10 reps  ASSESSMENT:  CLINICAL IMPRESSION:   Worked more on balance and functional activity tolerance this session. Provided initial HEP as well. Really making excellent progress so far with skilled PT services, happy to see how far he's come along already! He did report an almost fall coming down the steps at home, we will plan to work in more eccentric quad strength as appropriate moving forward.      OBJECTIVE IMPAIRMENTS: Abnormal gait, cardiopulmonary status limiting activity, decreased activity tolerance, decreased balance, decreased  coordination, decreased knowledge of use of DME, decreased mobility, difficulty walking, decreased strength, and pain.   ACTIVITY LIMITATIONS: sitting, standing, squatting, stairs, transfers, and locomotion level  PARTICIPATION LIMITATIONS: driving, shopping, community activity, occupation, and yard work  PERSONAL FACTORS: Behavior pattern, Fitness, Past/current experiences, Social background, and Time since onset of injury/illness/exacerbation are also affecting patient's functional outcome.   REHAB POTENTIAL: Good  CLINICAL DECISION MAKING: Evolving/moderate complexity  EVALUATION COMPLEXITY: Moderate   GOALS: Goals reviewed with patient? No  SHORT TERM GOALS: Target date: 04/20/2024   Will be compliant with appropriate progressive HEP  Baseline: Goal status: INITIAL  2.  Will be able to complete 5xSTS in 12 seconds  Baseline:  Goal status: INITIAL  3.  Will be able to ambulate household distances LRAD, Mod(I) Baseline:  Goal status: INITIAL  4.  Will be compliant with appropriate offloading schedule to assist in pressure relief/wound healing  Baseline:  Goal status: INITIAL  5.  Will be able to complete all bed mobility with Mod(I) Baseline:  Goal status: INITIAL    LONG TERM GOALS: Target date: 06/01/2024    MMT to have improved by at least 1 grade all weak groups  Baseline:  Goal status: INITIAL  2.  Will be able to ambulate at least 740ft with LRAD, Mod(I) to improve community access  Baseline:  Goal status: INITIAL  3.  Will score at least 45 on Berg  Baseline:  Goal status: INITIAL  4.  Will  be able to navigate full flight of steps and rail, Mod(I) Baseline:  Goal status: INITIAL  5.  PSFS to have improved by at least 3 points  Baseline:  Goal status: INITIAL     PLAN:  PT FREQUENCY: 2x/week  PT DURATION: 12 weeks  PLANNED INTERVENTIONS: 97750- Physical Performance Testing, 97110-Therapeutic exercises, 97530- Therapeutic activity, 97112-  Neuromuscular re-education, 97535- Self Care, [id number]- Manual therapy, [id number]- Gait training, and DME instructions  PLAN FOR NEXT SESSION: update 5xSTS next visit, strength, functional transfers, functional activity tolerance, balance, f/u on ROJO PRN. Work in Electronics engineer as able. CAUTION WITH NUSTEP/BIKE DUE TO SACRAL WOUND   Josette Rough, PT, DPT 03/28/24 3:12 PM

## 2024-03-29 ENCOUNTER — Encounter (HOSPITAL_BASED_OUTPATIENT_CLINIC_OR_DEPARTMENT_OTHER): Admitting: Internal Medicine

## 2024-03-29 ENCOUNTER — Encounter (HOSPITAL_BASED_OUTPATIENT_CLINIC_OR_DEPARTMENT_OTHER): Attending: Internal Medicine | Admitting: Internal Medicine

## 2024-03-29 DIAGNOSIS — X58XXXA Exposure to other specified factors, initial encounter: Secondary | ICD-10-CM | POA: Insufficient documentation

## 2024-03-29 DIAGNOSIS — M4628 Osteomyelitis of vertebra, sacral and sacrococcygeal region: Secondary | ICD-10-CM | POA: Insufficient documentation

## 2024-03-29 DIAGNOSIS — Z85048 Personal history of other malignant neoplasm of rectum, rectosigmoid junction, and anus: Secondary | ICD-10-CM | POA: Insufficient documentation

## 2024-03-29 DIAGNOSIS — E11622 Type 2 diabetes mellitus with other skin ulcer: Secondary | ICD-10-CM | POA: Insufficient documentation

## 2024-03-29 DIAGNOSIS — L598 Other specified disorders of the skin and subcutaneous tissue related to radiation: Secondary | ICD-10-CM | POA: Diagnosis not present

## 2024-03-29 DIAGNOSIS — S31000A Unspecified open wound of lower back and pelvis without penetration into retroperitoneum, initial encounter: Secondary | ICD-10-CM | POA: Insufficient documentation

## 2024-03-29 DIAGNOSIS — C20 Malignant neoplasm of rectum: Secondary | ICD-10-CM

## 2024-03-29 DIAGNOSIS — Z933 Colostomy status: Secondary | ICD-10-CM | POA: Insufficient documentation

## 2024-03-29 LAB — GLUCOSE, CAPILLARY
Glucose-Capillary: 180 mg/dL — ABNORMAL HIGH (ref 70–99)
Glucose-Capillary: 163 mg/dL — ABNORMAL HIGH (ref 70–99)

## 2024-03-30 ENCOUNTER — Encounter (HOSPITAL_BASED_OUTPATIENT_CLINIC_OR_DEPARTMENT_OTHER): Admitting: General Surgery

## 2024-03-30 ENCOUNTER — Ambulatory Visit: Admitting: Physical Therapy

## 2024-03-30 ENCOUNTER — Encounter: Payer: Self-pay | Admitting: Physical Therapy

## 2024-03-30 DIAGNOSIS — M6281 Muscle weakness (generalized): Secondary | ICD-10-CM | POA: Insufficient documentation

## 2024-03-30 DIAGNOSIS — L598 Other specified disorders of the skin and subcutaneous tissue related to radiation: Secondary | ICD-10-CM | POA: Diagnosis not present

## 2024-03-30 DIAGNOSIS — S31000A Unspecified open wound of lower back and pelvis without penetration into retroperitoneum, initial encounter: Secondary | ICD-10-CM | POA: Diagnosis not present

## 2024-03-30 DIAGNOSIS — Z85048 Personal history of other malignant neoplasm of rectum, rectosigmoid junction, and anus: Secondary | ICD-10-CM | POA: Diagnosis not present

## 2024-03-30 DIAGNOSIS — M4628 Osteomyelitis of vertebra, sacral and sacrococcygeal region: Secondary | ICD-10-CM | POA: Diagnosis not present

## 2024-03-30 DIAGNOSIS — R262 Difficulty in walking, not elsewhere classified: Secondary | ICD-10-CM | POA: Diagnosis not present

## 2024-03-30 DIAGNOSIS — R2681 Unsteadiness on feet: Secondary | ICD-10-CM | POA: Insufficient documentation

## 2024-03-30 DIAGNOSIS — C20 Malignant neoplasm of rectum: Secondary | ICD-10-CM | POA: Diagnosis not present

## 2024-03-30 DIAGNOSIS — X58XXXA Exposure to other specified factors, initial encounter: Secondary | ICD-10-CM | POA: Diagnosis not present

## 2024-03-30 DIAGNOSIS — E11622 Type 2 diabetes mellitus with other skin ulcer: Secondary | ICD-10-CM | POA: Diagnosis not present

## 2024-03-30 DIAGNOSIS — Z933 Colostomy status: Secondary | ICD-10-CM | POA: Diagnosis not present

## 2024-03-30 LAB — GLUCOSE, CAPILLARY
Glucose-Capillary: 198 mg/dL — ABNORMAL HIGH (ref 70–99)
Glucose-Capillary: 178 mg/dL — ABNORMAL HIGH (ref 70–99)

## 2024-03-30 NOTE — Therapy (Signed)
 OUTPATIENT PHYSICAL THERAPY LOWER EXTREMITY TREATMENT    Patient Name: Steven [name] MRN: [id number] DOB:[birth year], 53 y.o., male Today's Date: 03/30/2024  END OF SESSION:  PT End of Session - 03/30/24 1512     Visit Number 6    Number of Visits 25    Date for PT Re-Evaluation 06/01/24    Authorization Type [employer]    Authorization Time Period 03/09/24 to 06/01/24    Authorization - Number of Visits 60    PT Start Time 1430    PT Stop Time 1510    PT Time Calculation (min) 40 min    Activity Tolerance Patient tolerated treatment well;Patient limited by fatigue    Behavior During Therapy [facility] for tasks assessed/performed            Past Medical History:  Diagnosis Date   Anemia 02/2021   iron    Anxiety    Asthma    Bilateral pulmonary embolism (HCC) 04/24/2019   Dehiscence of anastomosis of large intestine 12/31/2022   Depression    Diabetes mellitus without complication (HCC)    History of kidney stones    HLD (hyperlipidemia)    Hypertension    Kidney stones    Neuromuscular disorder (HCC)    from radiation bi lat   OSA (obstructive sleep apnea)    CPAP   Peripheral vascular disease (HCC)    DVT   rectal ca 02/2021   Sleep apnea    Past Surgical History:  Procedure Laterality Date   APPENDECTOMY  1989   open appy   APPLICATION OF WOUND VAC N/A 12/30/2023   Procedure: APPLICATION, WOUND VAC;  Surgeon: Eletha Boas, MD;  Location: [facility];  Service: General;  Laterality: N/A;   BLADDER REPAIR  01/07/2024   Procedure: Closure of bladder cystotomy/fistula;  Surgeon: Elisabeth Valli BIRCH, MD;  Location: [facility];  Service: Urology;;   COLONOSCOPY  03/05/2021   CYST EXCISION  2011   left neck   DIVERTING ILEOSTOMY N/A 07/17/2021   Procedure: DIVERTING ILEOSTOMY;  Surgeon: Debby Hila, MD;  Location: [facility];  Service: General;  Laterality: N/A;   FLEXIBLE SIGMOIDOSCOPY N/A 04/04/2022   Procedure: FLEXIBLE SIGMOIDOSCOPY WITH POSSIBLE DEBRIDEMENT;  Surgeon: Debby Hila, MD;  Location: [facility] [place];  Service: General;  Laterality: N/A;   INCISION AND DRAINAGE OF WOUND N/A 01/04/2024   Procedure: IRRIGATION AND DEBRIDEMENT WOUND; WOUND VAC REPLACEMENT;  Surgeon: Rubin Calamity, MD;  Location: [facility];  Service: General;  Laterality: N/A;   INCISION AND DRAINAGE PERIRECTAL ABSCESS N/A 12/16/2023   Procedure: DEBRIDEMENT OF POST OP WOUND;  Surgeon: Debby Hila, MD;  Location: [facility];  Service: General;  Laterality: N/A;  I & D PERIONEAL WOUND   IRRIGATION AND DEBRIDEMENT ABSCESS N/A 12/30/2023   Procedure: IRRIGATION AND DEBRIDEMENT ABSCESS;  Surgeon: Eletha Boas, MD;  Location: [facility];  Service: General;  Laterality: N/A;   IRRIGATION AND DEBRIDEMENT ABSCESS N/A 01/01/2024   Procedure: IRRIGATION AND DEBRIDEMENT ABSCESS;  Surgeon: Eletha Boas, MD;  Location: [facility];  Service: General;  Laterality: N/A;   IRRIGATION AND DEBRIDEMENT ABSCESS N/A 01/07/2024   Procedure: IRRIGATION AND DEBRIDEMENT ABSCESS (N/A) - WOUND EXPLORATION AND DEBRIDEMENT.  CHANGE WOUND VAC DRESSING.  IRRIGATION AND DEBRIDEMENT OF ABDOMINAL WALL ABSCESS.;  Surgeon: Rubin Calamity, MD;  Location: [facility];  Service: General;  Laterality: N/A;   IRRIGATION AND DEBRIDEMENT ABSCESS N/A 01/11/2024   Procedure: EUA, WOUND VAC CHANGE, EXCISIONAL DEBRIDEMENT OF TISSUE,  MUSCLE AND PERINIEUM.;  Surgeon:  Tanda Locus, MD;  Location: [facility];  Service: General;  Laterality: N/A;   RECTAL EXAM UNDER ANESTHESIA N/A 04/04/2022   Procedure: ANAL EXAM UNDER ANESTHESIA;  Surgeon: Debby Hila, MD;  Location: [facility];  Service: General;  Laterality: N/A;   RECTAL EXAM UNDER ANESTHESIA N/A 12/25/2023   Procedure: EXAM UNDER ANESTHESIA, RECTUM;  Surgeon: Vanderbilt Debby, MD;  Location: [facility];  Service: General;  Laterality: N/A;   RECTOPEXY N/A 12/02/2023   Procedure: Perineal Proctatectomy;  Surgeon: Debby Hila, MD;  Location: [facility];  Service: General;   Laterality: N/A;  Completion Proctatectomy   WISDOM TOOTH EXTRACTION     age 3   XI ROBOTIC ASSISTED LOWER ANTERIOR RESECTION N/A 07/17/2021   Procedure: XI ROBOTIC ASSISTED LOWER ANTERIOR RESECTION;  Surgeon: Debby Hila, MD;  Location: [facility];  Service: General;  Laterality: N/A;   XI ROBOTIC ASSISTED LOWER ANTERIOR RESECTION N/A 12/31/2022   Procedure: ROBOTIC ASSISTED LOWER ANTERIOR RESECTION WITH OSTOMY with intraop ICG guided perfusion, takedown of iliostomy;  Surgeon: Debby Hila, MD;  Location: [facility];  Service: General;  Laterality: N/A;   Patient Active Problem List   Diagnosis Date Noted   Symptomatic anemia 03/03/2024   Goals of care, counseling/discussion 01/10/2024   Fistula 01/09/2024   History of rectal cancer 01/09/2024   Disruption of perineal wound in male 01/06/2024   Pain 01/06/2024   Post-operative hemoglobin drop 01/05/2024   High risk medication use 01/05/2024   Need for emotional support 01/05/2024   Counseling and coordination of care 01/05/2024   Medication management 01/05/2024   Palliative care encounter 01/05/2024   Anemia due to acute blood loss 12/25/2023   Necrosis of surgical wound (HCC) 12/15/2023   AKI (acute kidney injury) (HCC) 11/30/2023   Chronic pelvic abscess due to rectal stump disruption 11/29/2023   Parastomal hernia without obstruction or gangrene 11/29/2023   Colostomy in place [facility]) 11/29/2023   History of pulmonary embolus (PE) 11/29/2023   Chronic anticoagulation 11/29/2023   Intraperitoneal abscess (HCC) 11/29/2023   Peripheral neuropathy due to chemotherapy (HCC) 07/07/2022   Cancer related pain 04/30/2021   Tenesmus 04/16/2021   Chemotherapy-induced nausea 04/03/2021   Adenocarcinoma of rectum (HCC) 03/21/2021   Sensorineural hearing loss (SNHL) of both ears 01/21/2021   Gastroesophageal reflux disease 12/26/2020   Subjective hearing loss 12/18/2020   Uncontrolled diabetes mellitus with hyperglycemia (HCC) 04/26/2019    Obesity (BMI 30-39.9) 04/26/2019   Asthma    Anxiety    OSA (obstructive sleep apnea)    HLD (hyperlipidemia)     PCP: Arloa Fallow MD   REFERRING PROVIDER: Thresa Marylynn Phi, NP  REFERRING DIAG: [phone] (ICD-10-CM) - Non-pressure chronic ulcer of back with unspecified severity R26.2 (ICD-10-CM) - Difficulty in walking, not elsewhere classified  THERAPY DIAG:  Muscle weakness (generalized)  Difficulty in walking, not elsewhere classified  Unsteadiness on feet  Rationale for Evaluation and Treatment: Rehabilitation  ONSET DATE: March 2025 (surgeries), cancer started 2022  SUBJECTIVE:   SUBJECTIVE STATEMENT:  Feeling really sore today, not sure what its from. Not trying to go down steps just yet after what happened the other day. Having trouble reaching down to pick something up off the floor     EVAL:  I spent 2-3 months in the hospital laying in bed not able to really get up. Have a hx of rectal cancer with multiple surgeries to remove this and additional tissues, then had a blockage and ended up back in the hospital and  more surgeries. At the end of this got some AIR at [facility] but it didn't add up to much, they weren't that aggressive. It was difficult to get around after getting home, now able to stand and transfer. Getting hyperbaric therapy at [facility]. Using a waffle cushion in the chair now for wound/pressure relief, have to shift a lot. Need to build strength and stamina in LEs.   PERTINENT HISTORY: See above  PAIN:  Are you having pain? Yes: NPRS scale: 5/10 Pain location: glutes, general  Pain description: soreness  Aggravating factors: pressure on wound, general soreness comes and goes  Relieving factors: offloading, general soreness comes and goes    PRECAUTIONS: Other: suprapubic cath, fistula in urethra, sacral wound, fall risk, colostomy, PICC  RED FLAGS: None   WEIGHT BEARING RESTRICTIONS: No  FALLS:  Has patient fallen in last 6 months? Yes. Number  of falls 1 trying to get to bathroom, lower to ground situation with spouse   LIVING ENVIRONMENT: Lives with: lives with their spouse Lives in: House/apartment Stairs: stays on first floor/does not go upstairs for now  Has following equipment at home: Environmental consultant - 2 wheeled, Wheelchair (manual), Grab bars, and Ramped entry  OCCUPATION: software develop- now on medical leave   PLOF: Independent, Independent with basic ADLs, Independent with gait, and Independent with transfers  PATIENT GOALS: improve strength, walk, improving balance, get back to PLOF   NEXT MD VISIT: PCP PRN   OBJECTIVE:  Note: Objective measures were completed at Evaluation unless otherwise noted.    PATIENT SURVEYS:    Patient-Specific Activity Scoring Scheme  0 represents "unable to perform." 10 represents "able to perform at prior level. 0 1 2 3 4 5 6 7 8 9  10 (Date and Score)   Activity Eval     1. Walking   4    2. Steps  0     3.     4.    5.    Score 2    Total score = sum of the activity scores/number of activities Minimum detectable change (90%CI) for average score = 2 points Minimum detectable change (90%CI) for single activity score = 3 points     COGNITION: Overall cognitive status: Within functional limits for tasks assessed     SENSATION: Not tested      LOWER EXTREMITY MMT:  MMT Right eval Left eval  Hip flexion 3+ 3+  Hip extension    Hip abduction    Hip adduction    Hip internal rotation    Hip external rotation    Knee flexion 4 4  Knee extension 3- 4  Ankle dorsiflexion 3 3  Ankle plantarflexion    Ankle inversion    Ankle eversion     (Blank rows = not tested)    FUNCTIONAL TESTS:  5 times sit to stand: 18.7 seconds use of UEs   GAIT: Distance walked: 184ft Assistive device utilized: Walker - 2 wheeled Level of assistance: CGA with WC follow  Comments: slow but  TREATMENT DATE:   03/30/24  Tandem walking forward and backward x4 laps min guard Side steps on blue foam pad x4 laps  One foot on BOSU/other on ground 3x30 seconds B  Forward and lateral side steps over full and half foam rollers   Walking without walker, close S for safety 137ftx2   03/28/24  Tandem stance solid surface 3x30 seconds B  Tandem walking x4 laps in // bars  Forward lunges onto BOSU cues for forward wt shift x10 B  Standing hip ABD red TB 2x10 B Attempted goblet STS but unable with 5# and unweighted from standard chair  Walking without walker, min guard for functional activity tolerance 147ftx2     03/23/24 NuStep L 3 x 3 min stopped due to wound  pain Seated stretching reaching to the floor Sit to stand form elevated surface, progressive lowering Gait no AD 50ft some instability 4in step ups in RW 2x5  HS Curls green 2x15 LAQ 3lb 2x10 3lb standing march in RW x5      PATIENT EDUCATION:  Education details: as above  Person educated: Patient and Spouse Education method: Explanation Education comprehension: verbalized understanding and needs further education  HOME EXERCISE PROGRAM:   Access Code: [email or web address] URL: [email or web address] Date: 03/28/2024 Prepared by: Josette Rough  Exercises - Tandem Stance in Corner  - 1 x daily - 7 x weekly - 1-2 sets - 6 reps - 30 seconds  hold - Tandem Walking with Counter Support  - 1 x daily - 7 x weekly - 1-2 sets - 4-5 reps - Standing Hip Abduction with Resistance at Thighs  - 1 x daily - 5 x weekly - 1-2 sets - 10 reps - Sit to Stand with Armchair  - 1 x daily - 5 x weekly - 1-2 sets - 5-10 reps  ASSESSMENT:  CLINICAL IMPRESSION:  He was little more sore today, we focused more on balance per his request than on strength. Deferred objectives today due to soreness related slower movements, will likely come back to this next visit. Continued to  progress all activities as tolerated today, remains motivated to improve. May be getting plastic surgery on his wound at some point, not sure when.      OBJECTIVE IMPAIRMENTS: Abnormal gait, cardiopulmonary status limiting activity, decreased activity tolerance, decreased balance, decreased coordination, decreased knowledge of use of DME, decreased mobility, difficulty walking, decreased strength, and pain.   ACTIVITY LIMITATIONS: sitting, standing, squatting, stairs, transfers, and locomotion level  PARTICIPATION LIMITATIONS: driving, shopping, community activity, occupation, and yard work  PERSONAL FACTORS: Behavior pattern, Fitness, Past/current experiences, Social background, and Time since onset of injury/illness/exacerbation are also affecting patient's functional outcome.   REHAB POTENTIAL: Good  CLINICAL DECISION MAKING: Evolving/moderate complexity  EVALUATION COMPLEXITY: Moderate   GOALS: Goals reviewed with patient? No  SHORT TERM GOALS: Target date: 04/20/2024   Will be compliant with appropriate progressive HEP  Baseline: Goal status: INITIAL  2.  Will be able to complete 5xSTS in 12 seconds  Baseline:  Goal status: INITIAL  3.  Will be able to ambulate household distances LRAD, Mod(I) Baseline:  Goal status: INITIAL  4.  Will be compliant with appropriate offloading schedule to assist in pressure relief/wound healing  Baseline:  Goal status: INITIAL  5.  Will be able to complete all bed mobility with Mod(I) Baseline:  Goal status: INITIAL    LONG TERM GOALS: Target date: 06/01/2024    MMT to have improved by at least 1 grade all weak  groups  Baseline:  Goal status: INITIAL  2.  Will be able to ambulate at least 754ft with LRAD, Mod(I) to improve community access  Baseline:  Goal status: INITIAL  3.  Will score at least 45 on Berg  Baseline:  Goal status: INITIAL  4.  Will be able to navigate full flight of steps and rail, Mod(I) Baseline:   Goal status: INITIAL  5.  PSFS to have improved by at least 3 points  Baseline:  Goal status: INITIAL     PLAN:  PT FREQUENCY: 2x/week  PT DURATION: 12 weeks  PLANNED INTERVENTIONS: 97750- Physical Performance Testing, 97110-Therapeutic exercises, 97530- Therapeutic activity, [id number]- Neuromuscular re-education, 97535- Self Care, [id number]- Manual therapy, [id number]- Gait training, and DME instructions  PLAN FOR NEXT SESSION:  update objectives, strength, functional transfers, functional activity tolerance, balance, f/u on ROJO PRN. Work in Electronics engineer as able. CAUTION WITH NUSTEP/BIKE DUE TO SACRAL WOUND   Josette Rough, PT, DPT 03/30/24 3:13 PM

## 2024-03-31 ENCOUNTER — Encounter (HOSPITAL_BASED_OUTPATIENT_CLINIC_OR_DEPARTMENT_OTHER): Admitting: General Surgery

## 2024-03-31 DIAGNOSIS — K632 Fistula of intestine: Secondary | ICD-10-CM | POA: Diagnosis not present

## 2024-03-31 DIAGNOSIS — C20 Malignant neoplasm of rectum: Secondary | ICD-10-CM | POA: Diagnosis not present

## 2024-04-01 DIAGNOSIS — C20 Malignant neoplasm of rectum: Secondary | ICD-10-CM | POA: Diagnosis not present

## 2024-04-02 DIAGNOSIS — C20 Malignant neoplasm of rectum: Secondary | ICD-10-CM | POA: Diagnosis not present

## 2024-04-03 DIAGNOSIS — C20 Malignant neoplasm of rectum: Secondary | ICD-10-CM | POA: Diagnosis not present

## 2024-04-04 ENCOUNTER — Encounter: Payer: Self-pay | Admitting: Physical Therapy

## 2024-04-04 ENCOUNTER — Ambulatory Visit: Admitting: Physical Therapy

## 2024-04-04 ENCOUNTER — Encounter (HOSPITAL_BASED_OUTPATIENT_CLINIC_OR_DEPARTMENT_OTHER): Admitting: Internal Medicine

## 2024-04-04 DIAGNOSIS — E11622 Type 2 diabetes mellitus with other skin ulcer: Secondary | ICD-10-CM | POA: Diagnosis not present

## 2024-04-04 DIAGNOSIS — R262 Difficulty in walking, not elsewhere classified: Secondary | ICD-10-CM

## 2024-04-04 DIAGNOSIS — L598 Other specified disorders of the skin and subcutaneous tissue related to radiation: Secondary | ICD-10-CM

## 2024-04-04 DIAGNOSIS — S31000A Unspecified open wound of lower back and pelvis without penetration into retroperitoneum, initial encounter: Secondary | ICD-10-CM

## 2024-04-04 DIAGNOSIS — R2681 Unsteadiness on feet: Secondary | ICD-10-CM

## 2024-04-04 DIAGNOSIS — M4628 Osteomyelitis of vertebra, sacral and sacrococcygeal region: Secondary | ICD-10-CM | POA: Diagnosis not present

## 2024-04-04 DIAGNOSIS — C20 Malignant neoplasm of rectum: Secondary | ICD-10-CM

## 2024-04-04 DIAGNOSIS — M6281 Muscle weakness (generalized): Secondary | ICD-10-CM

## 2024-04-04 DIAGNOSIS — Z933 Colostomy status: Secondary | ICD-10-CM | POA: Diagnosis not present

## 2024-04-04 DIAGNOSIS — X58XXXA Exposure to other specified factors, initial encounter: Secondary | ICD-10-CM | POA: Diagnosis not present

## 2024-04-04 DIAGNOSIS — Z85048 Personal history of other malignant neoplasm of rectum, rectosigmoid junction, and anus: Secondary | ICD-10-CM | POA: Diagnosis not present

## 2024-04-04 NOTE — Therapy (Signed)
 OUTPATIENT PHYSICAL THERAPY LOWER EXTREMITY TREATMENT    Patient Name: Steven [name] MRN: [id number] DOB:08-05-1971, 53 y.o., male Today's Date: 04/04/2024  END OF SESSION:  PT End of Session - 04/04/24 1448     Visit Number 7    Number of Visits 25    Date for PT Re-Evaluation 06/01/24    Authorization Type [employer]    Authorization Time Period 03/09/24 to 06/01/24    Authorization - Number of Visits 60    PT Start Time 1432    PT Stop Time 1510    PT Time Calculation (min) 38 min    Activity Tolerance Patient tolerated treatment well;Patient limited by fatigue    Behavior During Therapy [facility] for tasks assessed/performed             Past Medical History:  Diagnosis Date   Anemia 02/2021   iron    Anxiety    Asthma    Bilateral pulmonary embolism (HCC) 04/24/2019   Dehiscence of anastomosis of large intestine 12/31/2022   Depression    Diabetes mellitus without complication (HCC)    History of kidney stones    HLD (hyperlipidemia)    Hypertension    Kidney stones    Neuromuscular disorder (HCC)    from radiation bi lat   OSA (obstructive sleep apnea)    CPAP   Peripheral vascular disease (HCC)    DVT   rectal ca 02/2021   Sleep apnea    Past Surgical History:  Procedure Laterality Date   APPENDECTOMY  1989   open appy   APPLICATION OF WOUND VAC N/A 12/30/2023   Procedure: APPLICATION, WOUND VAC;  Surgeon: Eletha Boas, MD;  Location: [facility];  Service: General;  Laterality: N/A;   BLADDER REPAIR  01/07/2024   Procedure: Closure of bladder cystotomy/fistula;  Surgeon: Elisabeth Valli BIRCH, MD;  Location: [facility];  Service: Urology;;   COLONOSCOPY  03/05/2021   CYST EXCISION  2011   left neck   DIVERTING ILEOSTOMY N/A 07/17/2021   Procedure: DIVERTING ILEOSTOMY;  Surgeon: Debby Hila, MD;  Location: [facility];  Service: General;  Laterality: N/A;   FLEXIBLE SIGMOIDOSCOPY N/A 04/04/2022   Procedure: FLEXIBLE SIGMOIDOSCOPY WITH POSSIBLE DEBRIDEMENT;  Surgeon:  Debby Hila, MD;  Location: [facility] [place];  Service: General;  Laterality: N/A;   INCISION AND DRAINAGE OF WOUND N/A 01/04/2024   Procedure: IRRIGATION AND DEBRIDEMENT WOUND; WOUND VAC REPLACEMENT;  Surgeon: Rubin Calamity, MD;  Location: [facility];  Service: General;  Laterality: N/A;   INCISION AND DRAINAGE PERIRECTAL ABSCESS N/A 12/16/2023   Procedure: DEBRIDEMENT OF POST OP WOUND;  Surgeon: Debby Hila, MD;  Location: [facility];  Service: General;  Laterality: N/A;  I & D PERIONEAL WOUND   IRRIGATION AND DEBRIDEMENT ABSCESS N/A 12/30/2023   Procedure: IRRIGATION AND DEBRIDEMENT ABSCESS;  Surgeon: Eletha Boas, MD;  Location: [facility];  Service: General;  Laterality: N/A;   IRRIGATION AND DEBRIDEMENT ABSCESS N/A 01/01/2024   Procedure: IRRIGATION AND DEBRIDEMENT ABSCESS;  Surgeon: Eletha Boas, MD;  Location: [facility];  Service: General;  Laterality: N/A;   IRRIGATION AND DEBRIDEMENT ABSCESS N/A 01/07/2024   Procedure: IRRIGATION AND DEBRIDEMENT ABSCESS (N/A) - WOUND EXPLORATION AND DEBRIDEMENT.  CHANGE WOUND VAC DRESSING.  IRRIGATION AND DEBRIDEMENT OF ABDOMINAL WALL ABSCESS.;  Surgeon: Rubin Calamity, MD;  Location: [facility];  Service: General;  Laterality: N/A;   IRRIGATION AND DEBRIDEMENT ABSCESS N/A 01/11/2024   Procedure: EUA, WOUND VAC CHANGE, EXCISIONAL DEBRIDEMENT OF TISSUE,  MUSCLE AND PERINIEUM.;  Surgeon: Tanda Locus, MD;  Location: [facility];  Service: General;  Laterality: N/A;   RECTAL EXAM UNDER ANESTHESIA N/A 04/04/2022   Procedure: ANAL EXAM UNDER ANESTHESIA;  Surgeon: Debby Hila, MD;  Location: [facility];  Service: General;  Laterality: N/A;   RECTAL EXAM UNDER ANESTHESIA N/A 12/25/2023   Procedure: EXAM UNDER ANESTHESIA, RECTUM;  Surgeon: Vanderbilt Debby, MD;  Location: [facility];  Service: General;  Laterality: N/A;   RECTOPEXY N/A 12/02/2023   Procedure: Perineal Proctatectomy;  Surgeon: Debby Hila, MD;  Location: [facility];  Service: General;   Laterality: N/A;  Completion Proctatectomy   WISDOM TOOTH EXTRACTION     age 74   XI ROBOTIC ASSISTED LOWER ANTERIOR RESECTION N/A 07/17/2021   Procedure: XI ROBOTIC ASSISTED LOWER ANTERIOR RESECTION;  Surgeon: Debby Hila, MD;  Location: [facility];  Service: General;  Laterality: N/A;   XI ROBOTIC ASSISTED LOWER ANTERIOR RESECTION N/A 12/31/2022   Procedure: ROBOTIC ASSISTED LOWER ANTERIOR RESECTION WITH OSTOMY with intraop ICG guided perfusion, takedown of iliostomy;  Surgeon: Debby Hila, MD;  Location: [facility];  Service: General;  Laterality: N/A;   Patient Active Problem List   Diagnosis Date Noted   Symptomatic anemia 03/03/2024   Goals of care, counseling/discussion 01/10/2024   Fistula 01/09/2024   History of rectal cancer 01/09/2024   Disruption of perineal wound in male 01/06/2024   Pain 01/06/2024   Post-operative hemoglobin drop 01/05/2024   High risk medication use 01/05/2024   Need for emotional support 01/05/2024   Counseling and coordination of care 01/05/2024   Medication management 01/05/2024   Palliative care encounter 01/05/2024   Anemia due to acute blood loss 12/25/2023   Necrosis of surgical wound (HCC) 12/15/2023   AKI (acute kidney injury) (HCC) 11/30/2023   Chronic pelvic abscess due to rectal stump disruption 11/29/2023   Parastomal hernia without obstruction or gangrene 11/29/2023   Colostomy in place [facility]) 11/29/2023   History of pulmonary embolus (PE) 11/29/2023   Chronic anticoagulation 11/29/2023   Intraperitoneal abscess (HCC) 11/29/2023   Peripheral neuropathy due to chemotherapy (HCC) 07/07/2022   Cancer related pain 04/30/2021   Tenesmus 04/16/2021   Chemotherapy-induced nausea 04/03/2021   Adenocarcinoma of rectum (HCC) 03/21/2021   Sensorineural hearing loss (SNHL) of both ears 01/21/2021   Gastroesophageal reflux disease 12/26/2020   Subjective hearing loss 12/18/2020   Uncontrolled diabetes mellitus with hyperglycemia (HCC) 04/26/2019    Obesity (BMI 30-39.9) 04/26/2019   Asthma    Anxiety    OSA (obstructive sleep apnea)    HLD (hyperlipidemia)     PCP: Arloa Fallow MD   REFERRING PROVIDER: Thresa Marylynn Phi, NP  REFERRING DIAG: [phone] (ICD-10-CM) - Non-pressure chronic ulcer of back with unspecified severity R26.2 (ICD-10-CM) - Difficulty in walking, not elsewhere classified  THERAPY DIAG:  Muscle weakness (generalized)  Difficulty in walking, not elsewhere classified  Unsteadiness on feet  Rationale for Evaluation and Treatment: Rehabilitation  ONSET DATE: March 2025 (surgeries), cancer started 2022  SUBJECTIVE:   SUBJECTIVE STATEMENT:   Doing OK, not as sore as last time just more tired. Wound is more sore though.    EVAL:  I spent 2-3 months in the hospital laying in bed not able to really get up. Have a hx of rectal cancer with multiple surgeries to remove this and additional tissues, then had a blockage and ended up back in the hospital and more surgeries. At the end of this got some AIR at [facility] but it didn't add up  to much, they weren't that aggressive. It was difficult to get around after getting home, now able to stand and transfer. Getting hyperbaric therapy at [facility]. Using a waffle cushion in the chair now for wound/pressure relief, have to shift a lot. Need to build strength and stamina in LEs.   PERTINENT HISTORY: See above  PAIN:  Are you having pain? Yes: NPRS scale: 3/10 Pain location: wound  Pain description: soreness  Aggravating factors: pressure on wound, general soreness comes and goes  Relieving factors: offloading, general soreness comes and goes    PRECAUTIONS: Other: suprapubic cath, fistula in urethra, sacral wound, fall risk, colostomy, PICC  RED FLAGS: None   WEIGHT BEARING RESTRICTIONS: No  FALLS:  Has patient fallen in last 6 months? Yes. Number of falls 1 trying to get to bathroom, lower to ground situation with spouse   LIVING ENVIRONMENT: Lives with: lives  with their spouse Lives in: House/apartment Stairs: stays on first floor/does not go upstairs for now  Has following equipment at home: Environmental consultant - 2 wheeled, Wheelchair (manual), Grab bars, and Ramped entry  OCCUPATION: software develop- now on medical leave   PLOF: Independent, Independent with basic ADLs, Independent with gait, and Independent with transfers  PATIENT GOALS: improve strength, walk, improving balance, get back to PLOF   NEXT MD VISIT: PCP PRN   OBJECTIVE:  Note: Objective measures were completed at Evaluation unless otherwise noted.    PATIENT SURVEYS:    Patient-Specific Activity Scoring Scheme  0 represents "unable to perform." 10 represents "able to perform at prior level. 0 1 2 3 4 5 6 7 8 9  10 (Date and Score)   Activity Eval     1. Walking   4    2. Steps  0     3.     4.    5.    Score 2    Total score = sum of the activity scores/number of activities Minimum detectable change (90%CI) for average score = 2 points Minimum detectable change (90%CI) for single activity score = 3 points     COGNITION: Overall cognitive status: Within functional limits for tasks assessed     SENSATION: Not tested      LOWER EXTREMITY MMT:  MMT Right eval Left eval Right 04/04/24 Left 04/04/24  Hip flexion 3+ 3+ 3 3  Hip extension      Hip abduction      Hip adduction      Hip internal rotation      Hip external rotation      Knee flexion 4 4    Knee extension 3- 4 4+ 4+  Ankle dorsiflexion 3 3 4 4   Ankle plantarflexion      Ankle inversion      Ankle eversion       (Blank rows = not tested)    FUNCTIONAL TESTS:  5 times sit to stand: 18.7 seconds use of UEs; 04/04/24 35 seconds no UEs limited by knee pain    GAIT: Distance walked: 127ft Assistive device utilized: Walker - 2 wheeled Level of assistance: CGA with WC follow  Comments: slow but  TREATMENT DATE:    04/04/24  MMT, 5xSTS  Forward step ups onto 4 inch box on blue foam pad x10 B Gait no device, S for safety: 261ft x2 Lateral step ups onto 4 inch box on blue foam pad x10 B Backward step ups 4 inch step x10 B  Forward/backward toe taps on blue foam pad x5 B Side steps  + step over small targets x1 round Side steps on blue foam pad x2 laps  Forward and lateral side steps over full foam rolls x4 rounds   03/30/24  Tandem walking forward and backward x4 laps min guard Side steps on blue foam pad x4 laps  One foot on BOSU/other on ground 3x30 seconds B  Forward and lateral side steps over full and half foam rollers   Walking without walker, close S for safety 126ftx2   03/28/24  Tandem stance solid surface 3x30 seconds B  Tandem walking x4 laps in // bars  Forward lunges onto BOSU cues for forward wt shift x10 B  Standing hip ABD red TB 2x10 B Attempted goblet STS but unable with 5# and unweighted from standard chair  Walking without walker, min guard for functional activity tolerance 150ftx2     03/23/24 NuStep L 3 x 3 min stopped due to wound  pain Seated stretching reaching to the floor Sit to stand form elevated surface, progressive lowering Gait no AD 81ft some instability 4in step ups in RW 2x5  HS Curls green 2x15 LAQ 3lb 2x10 3lb standing march in RW x5      PATIENT EDUCATION:  Education details: as above  Person educated: Patient and Spouse Education method: Explanation Education comprehension: verbalized understanding and needs further education  HOME EXERCISE PROGRAM:   Access Code: [email or web address] URL: [email or web address] Date: 03/28/2024 Prepared by: Josette Rough  Exercises - Tandem Stance in Corner  - 1 x daily - 7 x weekly - 1-2 sets - 6 reps - 30 seconds  hold - Tandem Walking with Counter Support  - 1 x daily - 7 x weekly - 1-2 sets - 4-5 reps - Standing Hip Abduction  with Resistance at Thighs  - 1 x daily - 5 x weekly - 1-2 sets - 10 reps - Sit to Stand with Armchair  - 1 x daily - 5 x weekly - 1-2 sets - 5-10 reps  ASSESSMENT:  CLINICAL IMPRESSION:  Doing OK today, MMT is improving but he was a little slower on 5xSTS maybe due to knee and wound pain. Progressing well overall, continued to encourage him to make additional appointments.     OBJECTIVE IMPAIRMENTS: Abnormal gait, cardiopulmonary status limiting activity, decreased activity tolerance, decreased balance, decreased coordination, decreased knowledge of use of DME, decreased mobility, difficulty walking, decreased strength, and pain.   ACTIVITY LIMITATIONS: sitting, standing, squatting, stairs, transfers, and locomotion level  PARTICIPATION LIMITATIONS: driving, shopping, community activity, occupation, and yard work  PERSONAL FACTORS: Behavior pattern, Fitness, Past/current experiences, Social background, and Time since onset of injury/illness/exacerbation are also affecting patient's functional outcome.   REHAB POTENTIAL: Good  CLINICAL DECISION MAKING: Evolving/moderate complexity  EVALUATION COMPLEXITY: Moderate   GOALS: Goals reviewed with patient? No  SHORT TERM GOALS: Target date: 04/20/2024   Will be compliant with appropriate progressive HEP  Baseline: Goal status: INITIAL  2.  Will be able to complete 5xSTS in 12 seconds  Baseline:  Goal status: INITIAL  3.  Will be able to ambulate household distances LRAD, Mod(I) Baseline:  Goal status: INITIAL  4.  Will be compliant with appropriate offloading schedule to assist in pressure relief/wound healing  Baseline:  Goal status: INITIAL  5.  Will be able to complete all bed mobility with Mod(I) Baseline:  Goal status: INITIAL    LONG TERM GOALS: Target date: 06/01/2024    MMT to have improved by at least 1 grade all weak groups  Baseline:  Goal status: INITIAL  2.  Will be able to ambulate at least 734ft with  LRAD, Mod(I) to improve community access  Baseline:  Goal status: INITIAL  3.  Will score at least 45 on Berg  Baseline:  Goal status: INITIAL  4.  Will be able to navigate full flight of steps and rail, Mod(I) Baseline:  Goal status: INITIAL  5.  PSFS to have improved by at least 3 points  Baseline:  Goal status: INITIAL     PLAN:  PT FREQUENCY: 2x/week  PT DURATION: 12 weeks  PLANNED INTERVENTIONS: 97750- Physical Performance Testing, 97110-Therapeutic exercises, 97530- Therapeutic activity, 97112- Neuromuscular re-education, 97535- Self Care, [id number]- Manual therapy, [id number]- Gait training, and DME instructions  PLAN FOR NEXT SESSION:  update HEP next visit especially if they haven't had the chance to make more appts yet, strength, functional transfers, functional activity tolerance, balance. Work in Electronics engineer as able. CAUTION WITH NUSTEP/BIKE DUE TO SACRAL WOUND   Josette Rough, PT, DPT 04/04/24 3:11 PM

## 2024-04-05 ENCOUNTER — Encounter (HOSPITAL_BASED_OUTPATIENT_CLINIC_OR_DEPARTMENT_OTHER): Admitting: Internal Medicine

## 2024-04-05 DIAGNOSIS — S31000A Unspecified open wound of lower back and pelvis without penetration into retroperitoneum, initial encounter: Secondary | ICD-10-CM | POA: Diagnosis not present

## 2024-04-05 DIAGNOSIS — C20 Malignant neoplasm of rectum: Secondary | ICD-10-CM | POA: Diagnosis not present

## 2024-04-05 DIAGNOSIS — L598 Other specified disorders of the skin and subcutaneous tissue related to radiation: Secondary | ICD-10-CM

## 2024-04-05 DIAGNOSIS — E11622 Type 2 diabetes mellitus with other skin ulcer: Secondary | ICD-10-CM | POA: Diagnosis not present

## 2024-04-05 DIAGNOSIS — M4628 Osteomyelitis of vertebra, sacral and sacrococcygeal region: Secondary | ICD-10-CM | POA: Diagnosis not present

## 2024-04-05 DIAGNOSIS — X58XXXA Exposure to other specified factors, initial encounter: Secondary | ICD-10-CM | POA: Diagnosis not present

## 2024-04-05 DIAGNOSIS — Z933 Colostomy status: Secondary | ICD-10-CM | POA: Diagnosis not present

## 2024-04-05 DIAGNOSIS — Z85048 Personal history of other malignant neoplasm of rectum, rectosigmoid junction, and anus: Secondary | ICD-10-CM | POA: Diagnosis not present

## 2024-04-05 LAB — GLUCOSE, CAPILLARY
Glucose-Capillary: 205 mg/dL — ABNORMAL HIGH (ref 70–99)
Glucose-Capillary: 221 mg/dL — ABNORMAL HIGH (ref 70–99)
Glucose-Capillary: 246 mg/dL — ABNORMAL HIGH (ref 70–99)
Glucose-Capillary: 248 mg/dL — ABNORMAL HIGH (ref 70–99)

## 2024-04-06 ENCOUNTER — Encounter: Payer: Self-pay | Admitting: Physical Therapy

## 2024-04-06 ENCOUNTER — Encounter (HOSPITAL_BASED_OUTPATIENT_CLINIC_OR_DEPARTMENT_OTHER): Admitting: Internal Medicine

## 2024-04-06 ENCOUNTER — Ambulatory Visit: Admitting: Physical Therapy

## 2024-04-06 DIAGNOSIS — M6281 Muscle weakness (generalized): Secondary | ICD-10-CM | POA: Diagnosis not present

## 2024-04-06 DIAGNOSIS — R2681 Unsteadiness on feet: Secondary | ICD-10-CM

## 2024-04-06 DIAGNOSIS — M4628 Osteomyelitis of vertebra, sacral and sacrococcygeal region: Secondary | ICD-10-CM | POA: Diagnosis not present

## 2024-04-06 DIAGNOSIS — L598 Other specified disorders of the skin and subcutaneous tissue related to radiation: Secondary | ICD-10-CM | POA: Diagnosis not present

## 2024-04-06 DIAGNOSIS — R262 Difficulty in walking, not elsewhere classified: Secondary | ICD-10-CM | POA: Diagnosis not present

## 2024-04-06 DIAGNOSIS — X58XXXA Exposure to other specified factors, initial encounter: Secondary | ICD-10-CM | POA: Diagnosis not present

## 2024-04-06 DIAGNOSIS — Z933 Colostomy status: Secondary | ICD-10-CM | POA: Diagnosis not present

## 2024-04-06 DIAGNOSIS — S31000A Unspecified open wound of lower back and pelvis without penetration into retroperitoneum, initial encounter: Secondary | ICD-10-CM

## 2024-04-06 DIAGNOSIS — C20 Malignant neoplasm of rectum: Secondary | ICD-10-CM | POA: Diagnosis not present

## 2024-04-06 DIAGNOSIS — Z85048 Personal history of other malignant neoplasm of rectum, rectosigmoid junction, and anus: Secondary | ICD-10-CM | POA: Diagnosis not present

## 2024-04-06 DIAGNOSIS — E11622 Type 2 diabetes mellitus with other skin ulcer: Secondary | ICD-10-CM | POA: Diagnosis not present

## 2024-04-06 LAB — GLUCOSE, CAPILLARY
Glucose-Capillary: 190 mg/dL — ABNORMAL HIGH (ref 70–99)
Glucose-Capillary: 175 mg/dL — ABNORMAL HIGH (ref 70–99)

## 2024-04-06 NOTE — Therapy (Signed)
 OUTPATIENT PHYSICAL THERAPY LOWER EXTREMITY TREATMENT    Patient Name: Steven [name] MRN: [id number] DOB:Jul 05, 1971, 53 y.o., male Today's Date: 04/06/2024  END OF SESSION:  PT End of Session - 04/06/24 1502     Visit Number 8    Number of Visits 25    Date for PT Re-Evaluation 06/01/24    Authorization Type [employer]    Authorization Time Period 03/09/24 to 06/01/24    Authorization - Number of Visits 60    PT Start Time 1432    PT Stop Time 1511    PT Time Calculation (min) 39 min    Activity Tolerance Patient tolerated treatment well;Patient limited by fatigue    Behavior During Therapy [facility] for tasks assessed/performed              Past Medical History:  Diagnosis Date   Anemia 02/2021   iron    Anxiety    Asthma    Bilateral pulmonary embolism (HCC) 04/24/2019   Dehiscence of anastomosis of large intestine 12/31/2022   Depression    Diabetes mellitus without complication (HCC)    History of kidney stones    HLD (hyperlipidemia)    Hypertension    Kidney stones    Neuromuscular disorder (HCC)    from radiation bi lat   OSA (obstructive sleep apnea)    CPAP   Peripheral vascular disease (HCC)    DVT   rectal ca 02/2021   Sleep apnea    Past Surgical History:  Procedure Laterality Date   APPENDECTOMY  1989   open appy   APPLICATION OF WOUND VAC N/A 12/30/2023   Procedure: APPLICATION, WOUND VAC;  Surgeon: Eletha Boas, MD;  Location: [facility];  Service: General;  Laterality: N/A;   BLADDER REPAIR  01/07/2024   Procedure: Closure of bladder cystotomy/fistula;  Surgeon: Elisabeth Valli BIRCH, MD;  Location: [facility];  Service: Urology;;   COLONOSCOPY  03/05/2021   CYST EXCISION  2011   left neck   DIVERTING ILEOSTOMY N/A 07/17/2021   Procedure: DIVERTING ILEOSTOMY;  Surgeon: Debby Hila, MD;  Location: [facility];  Service: General;  Laterality: N/A;   FLEXIBLE SIGMOIDOSCOPY N/A 04/04/2022   Procedure: FLEXIBLE SIGMOIDOSCOPY WITH POSSIBLE DEBRIDEMENT;  Surgeon:  Debby Hila, MD;  Location: [facility];  Service: General;  Laterality: N/A;   INCISION AND DRAINAGE OF WOUND N/A 01/04/2024   Procedure: IRRIGATION AND DEBRIDEMENT WOUND; WOUND VAC REPLACEMENT;  Surgeon: Rubin Calamity, MD;  Location: [facility];  Service: General;  Laterality: N/A;   INCISION AND DRAINAGE PERIRECTAL ABSCESS N/A 12/16/2023   Procedure: DEBRIDEMENT OF POST OP WOUND;  Surgeon: Debby Hila, MD;  Location: [facility];  Service: General;  Laterality: N/A;  I & D PERIONEAL WOUND   IRRIGATION AND DEBRIDEMENT ABSCESS N/A 12/30/2023   Procedure: IRRIGATION AND DEBRIDEMENT ABSCESS;  Surgeon: Eletha Boas, MD;  Location: [facility];  Service: General;  Laterality: N/A;   IRRIGATION AND DEBRIDEMENT ABSCESS N/A 01/01/2024   Procedure: IRRIGATION AND DEBRIDEMENT ABSCESS;  Surgeon: Eletha Boas, MD;  Location: [facility];  Service: General;  Laterality: N/A;   IRRIGATION AND DEBRIDEMENT ABSCESS N/A 01/07/2024   Procedure: IRRIGATION AND DEBRIDEMENT ABSCESS (N/A) - WOUND EXPLORATION AND DEBRIDEMENT.  CHANGE WOUND VAC DRESSING.  IRRIGATION AND DEBRIDEMENT OF ABDOMINAL WALL ABSCESS.;  Surgeon: Rubin Calamity, MD;  Location: [facility];  Service: General;  Laterality: N/A;   IRRIGATION AND DEBRIDEMENT ABSCESS N/A 01/11/2024   Procedure: EUA, WOUND VAC CHANGE, EXCISIONAL DEBRIDEMENT OF TISSUE,  MUSCLE AND PERINIEUM.;  Surgeon: Tanda Locus, MD;  Location: [facility];  Service: General;  Laterality: N/A;   RECTAL EXAM UNDER ANESTHESIA N/A 04/04/2022   Procedure: ANAL EXAM UNDER ANESTHESIA;  Surgeon: Debby Hila, MD;  Location: [facility] ;  Service: General;  Laterality: N/A;   RECTAL EXAM UNDER ANESTHESIA N/A 12/25/2023   Procedure: EXAM UNDER ANESTHESIA, RECTUM;  Surgeon: Vanderbilt Debby, MD;  Location: [facility];  Service: General;  Laterality: N/A;   RECTOPEXY N/A 12/02/2023   Procedure: Perineal Proctatectomy;  Surgeon: Debby Hila, MD;  Location: [facility];  Service: General;   Laterality: N/A;  Completion Proctatectomy   WISDOM TOOTH EXTRACTION     age 80   XI ROBOTIC ASSISTED LOWER ANTERIOR RESECTION N/A 07/17/2021   Procedure: XI ROBOTIC ASSISTED LOWER ANTERIOR RESECTION;  Surgeon: Debby Hila, MD;  Location: [facility];  Service: General;  Laterality: N/A;   XI ROBOTIC ASSISTED LOWER ANTERIOR RESECTION N/A 12/31/2022   Procedure: ROBOTIC ASSISTED LOWER ANTERIOR RESECTION WITH OSTOMY with intraop ICG guided perfusion, takedown of iliostomy;  Surgeon: Debby Hila, MD;  Location: [facility];  Service: General;  Laterality: N/A;   Patient Active Problem List   Diagnosis Date Noted   Symptomatic anemia 03/03/2024   Goals of care, counseling/discussion 01/10/2024   Fistula 01/09/2024   History of rectal cancer 01/09/2024   Disruption of perineal wound in male 01/06/2024   Pain 01/06/2024   Post-operative hemoglobin drop 01/05/2024   High risk medication use 01/05/2024   Need for emotional support 01/05/2024   Counseling and coordination of care 01/05/2024   Medication management 01/05/2024   Palliative care encounter 01/05/2024   Anemia due to acute blood loss 12/25/2023   Necrosis of surgical wound (HCC) 12/15/2023   AKI (acute kidney injury) (HCC) 11/30/2023   Chronic pelvic abscess due to rectal stump disruption 11/29/2023   Parastomal hernia without obstruction or gangrene 11/29/2023   Colostomy in place [facility]) 11/29/2023   History of pulmonary embolus (PE) 11/29/2023   Chronic anticoagulation 11/29/2023   Intraperitoneal abscess (HCC) 11/29/2023   Peripheral neuropathy due to chemotherapy (HCC) 07/07/2022   Cancer related pain 04/30/2021   Tenesmus 04/16/2021   Chemotherapy-induced nausea 04/03/2021   Adenocarcinoma of rectum (HCC) 03/21/2021   Sensorineural hearing loss (SNHL) of both ears 01/21/2021   Gastroesophageal reflux disease 12/26/2020   Subjective hearing loss 12/18/2020   Uncontrolled diabetes mellitus with hyperglycemia (HCC) 04/26/2019    Obesity (BMI 30-39.9) 04/26/2019   Asthma    Anxiety    OSA (obstructive sleep apnea)    HLD (hyperlipidemia)     PCP: Arloa Fallow MD   REFERRING PROVIDER: Thresa Marylynn Phi, NP  REFERRING DIAG: [id number] (ICD-10-CM) - Non-pressure chronic ulcer of back with unspecified severity R26.2 (ICD-10-CM) - Difficulty in walking, not elsewhere classified  THERAPY DIAG:  Muscle weakness (generalized)  Difficulty in walking, not elsewhere classified  Unsteadiness on feet  Rationale for Evaluation and Treatment: Rehabilitation  ONSET DATE: March 2025 (surgeries), cancer started 2022  SUBJECTIVE:   SUBJECTIVE STATEMENT:   Doing well, felt good but tired after last visit, took a 2 hour nap after. Tomorrow could potentially be my last hyperbaric wound tx depending on what the wound doctor thinks.    EVAL:  I spent 2-3 months in the hospital laying in bed not able to really get up. Have a hx of rectal cancer with multiple surgeries to remove this and additional tissues, then had a blockage and ended up back in the hospital and more surgeries.  At the end of this got some AIR at [facility] but it didn't add up to much, they weren't that aggressive. It was difficult to get around after getting home, now able to stand and transfer. Getting hyperbaric therapy at [facility]. Using a waffle cushion in the chair now for wound/pressure relief, have to shift a lot. Need to build strength and stamina in LEs.   PERTINENT HISTORY: See above  PAIN:  Are you having pain? Yes: NPRS scale: 1/10 Pain location: wound  Pain description: soreness  Aggravating factors: pressure on wound, general soreness comes and goes  Relieving factors: offloading, general soreness comes and goes    PRECAUTIONS: Other: suprapubic cath, fistula in urethra, sacral wound, fall risk, colostomy, PICC  RED FLAGS: None   WEIGHT BEARING RESTRICTIONS: No  FALLS:  Has patient fallen in last 6 months? Yes. Number of falls 1 trying to  get to bathroom, lower to ground situation with spouse   LIVING ENVIRONMENT: Lives with: lives with their spouse Lives in: House/apartment Stairs: stays on first floor/does not go upstairs for now  Has following equipment at home: Environmental consultant - 2 wheeled, Wheelchair (manual), Grab bars, and Ramped entry  OCCUPATION: software develop- now on medical leave   PLOF: Independent, Independent with basic ADLs, Independent with gait, and Independent with transfers  PATIENT GOALS: improve strength, walk, improving balance, get back to PLOF   NEXT MD VISIT: PCP PRN   OBJECTIVE:  Note: Objective measures were completed at Evaluation unless otherwise noted.    PATIENT SURVEYS:    Patient-Specific Activity Scoring Scheme  0 represents "unable to perform." 10 represents "able to perform at prior level. 0 1 2 3 4 5 6 7 8 9  10 (Date and Score)   Activity Eval     1. Walking   4    2. Steps  0     3.     4.    5.    Score 2    Total score = sum of the activity scores/number of activities Minimum detectable change (90%CI) for average score = 2 points Minimum detectable change (90%CI) for single activity score = 3 points     COGNITION: Overall cognitive status: Within functional limits for tasks assessed     SENSATION: Not tested      LOWER EXTREMITY MMT:  MMT Right eval Left eval Right 04/04/24 Left 04/04/24  Hip flexion 3+ 3+ 3 3  Hip extension      Hip abduction      Hip adduction      Hip internal rotation      Hip external rotation      Knee flexion 4 4    Knee extension 3- 4 4+ 4+  Ankle dorsiflexion 3 3 4 4   Ankle plantarflexion      Ankle inversion      Ankle eversion       (Blank rows = not tested)    FUNCTIONAL TESTS:  5 times sit to stand: 18.7 seconds use of UEs; 04/04/24 35 seconds no UEs limited by knee pain    GAIT: Distance walked: 174ft Assistive device utilized: Walker - 2 wheeled Level of assistance: CGA with WC follow  Comments: slow  but  TREATMENT DATE:    04/06/24  Attempted step ups on BOSU- after a couple reps, R ankle rolled a little. Stopped activity, able to freely move ankle against resistance though ROM, no discoloration or new edema, able bear wt without increased pain  Forward step ups on 4 inch box on blue pad x10 B Lateral step ups 6 inch step x15 B  Forward step downs 4 inch step x12 B   Alternating toe taps on blue foam pad to small targets x20  Rocker board AP x2 minutes   Walking without RW : 221ft 2# each UE, 255ft 5# each UE     04/04/24  MMT, 5xSTS  Forward step ups onto 4 inch box on blue foam pad x10 B Gait no device, S for safety: 228ft x2 Lateral step ups onto 4 inch box on blue foam pad x10 B Backward step ups 4 inch step x10 B  Forward/backward toe taps on blue foam pad x5 B Side steps  + step over small targets x1 round Side steps on blue foam pad x2 laps  Forward and lateral side steps over full foam rolls x4 rounds   03/30/24  Tandem walking forward and backward x4 laps min guard Side steps on blue foam pad x4 laps  One foot on BOSU/other on ground 3x30 seconds B  Forward and lateral side steps over full and half foam rollers   Walking without walker, close S for safety 111ftx2        PATIENT EDUCATION:  Education details: as above  Person educated: Patient and Spouse Education method: Explanation Education comprehension: verbalized understanding and needs further education  HOME EXERCISE PROGRAM:   Access Code: [email or web address] URL: [email or web address] Date: 03/28/2024 Prepared by: Josette Rough  Exercises - Tandem Stance in Corner  - 1 x daily - 7 x weekly - 1-2 sets - 6 reps - 30 seconds  hold - Tandem Walking with Counter Support  - 1 x daily - 7 x weekly - 1-2 sets - 4-5 reps - Standing Hip Abduction with  Resistance at Thighs  - 1 x daily - 5 x weekly - 1-2 sets - 10 reps - Sit to Stand with Armchair  - 1 x daily - 5 x weekly - 1-2 sets - 5-10 reps  ASSESSMENT:  CLINICAL IMPRESSION:  Arrives today feeling well- continued working on functional strength, balance, functional activity tolerance. Really looked to be feeling a lot better today and we were able to push a bit harder. Did have one moment where ankle rolled on BOSU but no clear injury, ROM/MMT/weight bearing and gait all WNL and tolerated session well. Will continue to progress.     OBJECTIVE IMPAIRMENTS: Abnormal gait, cardiopulmonary status limiting activity, decreased activity tolerance, decreased balance, decreased coordination, decreased knowledge of use of DME, decreased mobility, difficulty walking, decreased strength, and pain.   ACTIVITY LIMITATIONS: sitting, standing, squatting, stairs, transfers, and locomotion level  PARTICIPATION LIMITATIONS: driving, shopping, community activity, occupation, and yard work  PERSONAL FACTORS: Behavior pattern, Fitness, Past/current experiences, Social background, and Time since onset of injury/illness/exacerbation are also affecting patient's functional outcome.   REHAB POTENTIAL: Good  CLINICAL DECISION MAKING: Evolving/moderate complexity  EVALUATION COMPLEXITY: Moderate   GOALS: Goals reviewed with patient? No  SHORT TERM GOALS: Target date: 04/20/2024   Will be compliant with appropriate progressive HEP  Baseline: Goal status: INITIAL  2.  Will be able to complete 5xSTS in 12 seconds  Baseline:  Goal status: INITIAL  3.  Will be  able to ambulate household distances LRAD, Mod(I) Baseline:  Goal status: INITIAL  4.  Will be compliant with appropriate offloading schedule to assist in pressure relief/wound healing  Baseline:  Goal status: INITIAL  5.  Will be able to complete all bed mobility with Mod(I) Baseline:  Goal status: INITIAL    LONG TERM GOALS: Target  date: 06/01/2024    MMT to have improved by at least 1 grade all weak groups  Baseline:  Goal status: INITIAL  2.  Will be able to ambulate at least 789ft with LRAD, Mod(I) to improve community access  Baseline:  Goal status: INITIAL  3.  Will score at least 45 on Berg  Baseline:  Goal status: INITIAL  4.  Will be able to navigate full flight of steps and rail, Mod(I) Baseline:  Goal status: INITIAL  5.  PSFS to have improved by at least 3 points  Baseline:  Goal status: INITIAL     PLAN:  PT FREQUENCY: 2x/week  PT DURATION: 12 weeks  PLANNED INTERVENTIONS: 97750- Physical Performance Testing, 97110-Therapeutic exercises, 97530- Therapeutic activity, 97112- Neuromuscular re-education, 97535- Self Care, [id number]- Manual therapy, [id number]- Gait training, and DME instructions  PLAN FOR NEXT SESSION:  update HEP next visit especially if they haven't had the chance to make more appts yet, strength, functional transfers, functional activity tolerance, balance. Work in Electronics engineer as able. Careful with step ups onto BOSU due to ankle rolling/instability. CAUTION WITH NUSTEP/BIKE DUE TO SACRAL WOUND   Josette Rough, PT, DPT 04/06/24 3:12 PM

## 2024-04-07 ENCOUNTER — Encounter (HOSPITAL_BASED_OUTPATIENT_CLINIC_OR_DEPARTMENT_OTHER): Admitting: Internal Medicine

## 2024-04-07 DIAGNOSIS — S31000A Unspecified open wound of lower back and pelvis without penetration into retroperitoneum, initial encounter: Secondary | ICD-10-CM

## 2024-04-07 DIAGNOSIS — C20 Malignant neoplasm of rectum: Secondary | ICD-10-CM | POA: Diagnosis not present

## 2024-04-07 DIAGNOSIS — M4628 Osteomyelitis of vertebra, sacral and sacrococcygeal region: Secondary | ICD-10-CM | POA: Diagnosis not present

## 2024-04-07 DIAGNOSIS — L598 Other specified disorders of the skin and subcutaneous tissue related to radiation: Secondary | ICD-10-CM

## 2024-04-07 DIAGNOSIS — E11622 Type 2 diabetes mellitus with other skin ulcer: Secondary | ICD-10-CM

## 2024-04-07 DIAGNOSIS — Z85048 Personal history of other malignant neoplasm of rectum, rectosigmoid junction, and anus: Secondary | ICD-10-CM | POA: Diagnosis not present

## 2024-04-07 DIAGNOSIS — Z933 Colostomy status: Secondary | ICD-10-CM | POA: Diagnosis not present

## 2024-04-07 DIAGNOSIS — X58XXXA Exposure to other specified factors, initial encounter: Secondary | ICD-10-CM | POA: Diagnosis not present

## 2024-04-07 LAB — GLUCOSE, CAPILLARY
Glucose-Capillary: 246 mg/dL — ABNORMAL HIGH (ref 70–99)
Glucose-Capillary: 236 mg/dL — ABNORMAL HIGH (ref 70–99)

## 2024-04-08 ENCOUNTER — Encounter (HOSPITAL_BASED_OUTPATIENT_CLINIC_OR_DEPARTMENT_OTHER): Admitting: Internal Medicine

## 2024-04-08 DIAGNOSIS — X58XXXA Exposure to other specified factors, initial encounter: Secondary | ICD-10-CM | POA: Diagnosis not present

## 2024-04-08 DIAGNOSIS — C20 Malignant neoplasm of rectum: Secondary | ICD-10-CM | POA: Diagnosis not present

## 2024-04-08 DIAGNOSIS — Z933 Colostomy status: Secondary | ICD-10-CM | POA: Diagnosis not present

## 2024-04-08 DIAGNOSIS — Z85048 Personal history of other malignant neoplasm of rectum, rectosigmoid junction, and anus: Secondary | ICD-10-CM | POA: Diagnosis not present

## 2024-04-08 DIAGNOSIS — L598 Other specified disorders of the skin and subcutaneous tissue related to radiation: Secondary | ICD-10-CM | POA: Diagnosis not present

## 2024-04-08 DIAGNOSIS — S31000A Unspecified open wound of lower back and pelvis without penetration into retroperitoneum, initial encounter: Secondary | ICD-10-CM | POA: Diagnosis not present

## 2024-04-08 DIAGNOSIS — E11622 Type 2 diabetes mellitus with other skin ulcer: Secondary | ICD-10-CM

## 2024-04-08 DIAGNOSIS — M4628 Osteomyelitis of vertebra, sacral and sacrococcygeal region: Secondary | ICD-10-CM | POA: Diagnosis not present

## 2024-04-08 LAB — GLUCOSE, CAPILLARY
Glucose-Capillary: 212 mg/dL — ABNORMAL HIGH (ref 70–99)
Glucose-Capillary: 215 mg/dL — ABNORMAL HIGH (ref 70–99)

## 2024-04-09 DIAGNOSIS — C20 Malignant neoplasm of rectum: Secondary | ICD-10-CM | POA: Diagnosis not present

## 2024-04-10 DIAGNOSIS — C20 Malignant neoplasm of rectum: Secondary | ICD-10-CM | POA: Diagnosis not present

## 2024-04-11 ENCOUNTER — Encounter (HOSPITAL_BASED_OUTPATIENT_CLINIC_OR_DEPARTMENT_OTHER): Admitting: Internal Medicine

## 2024-04-11 DIAGNOSIS — S31000A Unspecified open wound of lower back and pelvis without penetration into retroperitoneum, initial encounter: Secondary | ICD-10-CM

## 2024-04-11 DIAGNOSIS — L598 Other specified disorders of the skin and subcutaneous tissue related to radiation: Secondary | ICD-10-CM

## 2024-04-11 DIAGNOSIS — Z85048 Personal history of other malignant neoplasm of rectum, rectosigmoid junction, and anus: Secondary | ICD-10-CM | POA: Diagnosis not present

## 2024-04-11 DIAGNOSIS — Z933 Colostomy status: Secondary | ICD-10-CM | POA: Diagnosis not present

## 2024-04-11 DIAGNOSIS — E11622 Type 2 diabetes mellitus with other skin ulcer: Secondary | ICD-10-CM | POA: Diagnosis not present

## 2024-04-11 DIAGNOSIS — X58XXXA Exposure to other specified factors, initial encounter: Secondary | ICD-10-CM | POA: Diagnosis not present

## 2024-04-11 DIAGNOSIS — C20 Malignant neoplasm of rectum: Secondary | ICD-10-CM | POA: Diagnosis not present

## 2024-04-11 DIAGNOSIS — M4628 Osteomyelitis of vertebra, sacral and sacrococcygeal region: Secondary | ICD-10-CM | POA: Diagnosis not present

## 2024-04-11 LAB — GLUCOSE, CAPILLARY
Glucose-Capillary: 273 mg/dL — ABNORMAL HIGH (ref 70–99)
Glucose-Capillary: 266 mg/dL — ABNORMAL HIGH (ref 70–99)

## 2024-04-12 ENCOUNTER — Encounter (HOSPITAL_BASED_OUTPATIENT_CLINIC_OR_DEPARTMENT_OTHER): Attending: Internal Medicine | Admitting: Internal Medicine

## 2024-04-12 ENCOUNTER — Encounter (HOSPITAL_BASED_OUTPATIENT_CLINIC_OR_DEPARTMENT_OTHER): Admitting: Internal Medicine

## 2024-04-12 DIAGNOSIS — S31000A Unspecified open wound of lower back and pelvis without penetration into retroperitoneum, initial encounter: Secondary | ICD-10-CM | POA: Diagnosis not present

## 2024-04-12 DIAGNOSIS — S31501A Unspecified open wound of unspecified external genital organs, male, initial encounter: Secondary | ICD-10-CM | POA: Diagnosis not present

## 2024-04-12 DIAGNOSIS — C20 Malignant neoplasm of rectum: Secondary | ICD-10-CM | POA: Diagnosis not present

## 2024-04-13 ENCOUNTER — Encounter (HOSPITAL_BASED_OUTPATIENT_CLINIC_OR_DEPARTMENT_OTHER): Admitting: Internal Medicine

## 2024-04-13 DIAGNOSIS — L598 Other specified disorders of the skin and subcutaneous tissue related to radiation: Secondary | ICD-10-CM | POA: Diagnosis not present

## 2024-04-13 DIAGNOSIS — S31000A Unspecified open wound of lower back and pelvis without penetration into retroperitoneum, initial encounter: Secondary | ICD-10-CM | POA: Diagnosis not present

## 2024-04-13 DIAGNOSIS — C20 Malignant neoplasm of rectum: Secondary | ICD-10-CM | POA: Diagnosis not present

## 2024-04-13 DIAGNOSIS — E11622 Type 2 diabetes mellitus with other skin ulcer: Secondary | ICD-10-CM | POA: Diagnosis not present

## 2024-04-13 DIAGNOSIS — X58XXXA Exposure to other specified factors, initial encounter: Secondary | ICD-10-CM | POA: Diagnosis not present

## 2024-04-13 DIAGNOSIS — Z85048 Personal history of other malignant neoplasm of rectum, rectosigmoid junction, and anus: Secondary | ICD-10-CM | POA: Diagnosis not present

## 2024-04-13 DIAGNOSIS — M4628 Osteomyelitis of vertebra, sacral and sacrococcygeal region: Secondary | ICD-10-CM

## 2024-04-13 DIAGNOSIS — Z933 Colostomy status: Secondary | ICD-10-CM | POA: Diagnosis not present

## 2024-04-14 ENCOUNTER — Encounter (HOSPITAL_BASED_OUTPATIENT_CLINIC_OR_DEPARTMENT_OTHER): Admitting: Internal Medicine

## 2024-04-14 DIAGNOSIS — Z933 Colostomy status: Secondary | ICD-10-CM | POA: Diagnosis not present

## 2024-04-14 DIAGNOSIS — L598 Other specified disorders of the skin and subcutaneous tissue related to radiation: Secondary | ICD-10-CM

## 2024-04-14 DIAGNOSIS — C20 Malignant neoplasm of rectum: Secondary | ICD-10-CM

## 2024-04-14 DIAGNOSIS — E11622 Type 2 diabetes mellitus with other skin ulcer: Secondary | ICD-10-CM | POA: Diagnosis not present

## 2024-04-14 DIAGNOSIS — M4628 Osteomyelitis of vertebra, sacral and sacrococcygeal region: Secondary | ICD-10-CM | POA: Diagnosis not present

## 2024-04-14 DIAGNOSIS — S31000A Unspecified open wound of lower back and pelvis without penetration into retroperitoneum, initial encounter: Secondary | ICD-10-CM

## 2024-04-14 DIAGNOSIS — Z85048 Personal history of other malignant neoplasm of rectum, rectosigmoid junction, and anus: Secondary | ICD-10-CM | POA: Diagnosis not present

## 2024-04-14 DIAGNOSIS — X58XXXA Exposure to other specified factors, initial encounter: Secondary | ICD-10-CM | POA: Diagnosis not present

## 2024-04-14 LAB — GLUCOSE, CAPILLARY
Glucose-Capillary: 244 mg/dL — ABNORMAL HIGH (ref 70–99)
Glucose-Capillary: 230 mg/dL — ABNORMAL HIGH (ref 70–99)
Glucose-Capillary: 231 mg/dL — ABNORMAL HIGH (ref 70–99)
Glucose-Capillary: 209 mg/dL — ABNORMAL HIGH (ref 70–99)

## 2024-04-15 ENCOUNTER — Encounter (HOSPITAL_BASED_OUTPATIENT_CLINIC_OR_DEPARTMENT_OTHER): Admitting: Internal Medicine

## 2024-04-15 ENCOUNTER — Other Ambulatory Visit: Payer: Self-pay | Admitting: Nurse Practitioner

## 2024-04-15 DIAGNOSIS — L598 Other specified disorders of the skin and subcutaneous tissue related to radiation: Secondary | ICD-10-CM | POA: Diagnosis not present

## 2024-04-15 DIAGNOSIS — G893 Neoplasm related pain (acute) (chronic): Secondary | ICD-10-CM

## 2024-04-15 DIAGNOSIS — M4628 Osteomyelitis of vertebra, sacral and sacrococcygeal region: Secondary | ICD-10-CM | POA: Diagnosis not present

## 2024-04-15 DIAGNOSIS — C20 Malignant neoplasm of rectum: Secondary | ICD-10-CM

## 2024-04-15 DIAGNOSIS — S31000A Unspecified open wound of lower back and pelvis without penetration into retroperitoneum, initial encounter: Secondary | ICD-10-CM | POA: Diagnosis not present

## 2024-04-15 DIAGNOSIS — Z85048 Personal history of other malignant neoplasm of rectum, rectosigmoid junction, and anus: Secondary | ICD-10-CM | POA: Diagnosis not present

## 2024-04-15 DIAGNOSIS — E11622 Type 2 diabetes mellitus with other skin ulcer: Secondary | ICD-10-CM | POA: Diagnosis not present

## 2024-04-15 DIAGNOSIS — X58XXXA Exposure to other specified factors, initial encounter: Secondary | ICD-10-CM | POA: Diagnosis not present

## 2024-04-15 DIAGNOSIS — Z515 Encounter for palliative care: Secondary | ICD-10-CM

## 2024-04-15 DIAGNOSIS — Z933 Colostomy status: Secondary | ICD-10-CM | POA: Diagnosis not present

## 2024-04-15 MED ORDER — MORPHINE SULFATE 15 MG PO TABS
7.5000 mg | ORAL_TABLET | ORAL | 0 refills | Status: DC | PRN
Start: 2024-04-15 — End: 2024-04-25

## 2024-04-15 MED ORDER — MORPHINE SULFATE ER 60 MG PO TBCR: 60.0000 mg | EXTENDED_RELEASE_TABLET | Freq: Two times a day (BID) | ORAL | 0 refills | Status: DC

## 2024-04-16 DIAGNOSIS — C20 Malignant neoplasm of rectum: Secondary | ICD-10-CM | POA: Diagnosis not present

## 2024-04-17 DIAGNOSIS — C20 Malignant neoplasm of rectum: Secondary | ICD-10-CM | POA: Diagnosis not present

## 2024-04-18 ENCOUNTER — Other Ambulatory Visit: Payer: Self-pay | Admitting: Nurse Practitioner

## 2024-04-18 ENCOUNTER — Other Ambulatory Visit (HOSPITAL_COMMUNITY): Payer: Self-pay

## 2024-04-18 ENCOUNTER — Encounter (HOSPITAL_BASED_OUTPATIENT_CLINIC_OR_DEPARTMENT_OTHER): Admitting: Internal Medicine

## 2024-04-18 DIAGNOSIS — C20 Malignant neoplasm of rectum: Secondary | ICD-10-CM

## 2024-04-18 DIAGNOSIS — S31000A Unspecified open wound of lower back and pelvis without penetration into retroperitoneum, initial encounter: Secondary | ICD-10-CM

## 2024-04-18 DIAGNOSIS — G893 Neoplasm related pain (acute) (chronic): Secondary | ICD-10-CM

## 2024-04-18 DIAGNOSIS — E11622 Type 2 diabetes mellitus with other skin ulcer: Secondary | ICD-10-CM | POA: Diagnosis not present

## 2024-04-18 DIAGNOSIS — Z515 Encounter for palliative care: Secondary | ICD-10-CM

## 2024-04-18 DIAGNOSIS — Z933 Colostomy status: Secondary | ICD-10-CM | POA: Diagnosis not present

## 2024-04-18 DIAGNOSIS — L598 Other specified disorders of the skin and subcutaneous tissue related to radiation: Secondary | ICD-10-CM

## 2024-04-18 DIAGNOSIS — X58XXXA Exposure to other specified factors, initial encounter: Secondary | ICD-10-CM | POA: Diagnosis not present

## 2024-04-18 DIAGNOSIS — M4628 Osteomyelitis of vertebra, sacral and sacrococcygeal region: Secondary | ICD-10-CM | POA: Diagnosis not present

## 2024-04-18 DIAGNOSIS — Z85048 Personal history of other malignant neoplasm of rectum, rectosigmoid junction, and anus: Secondary | ICD-10-CM | POA: Diagnosis not present

## 2024-04-18 LAB — GLUCOSE, CAPILLARY
Glucose-Capillary: 238 mg/dL — ABNORMAL HIGH (ref 70–99)
Glucose-Capillary: 189 mg/dL — ABNORMAL HIGH (ref 70–99)
Glucose-Capillary: 201 mg/dL — ABNORMAL HIGH (ref 70–99)
Glucose-Capillary: 207 mg/dL — ABNORMAL HIGH (ref 70–99)

## 2024-04-18 MED ORDER — MORPHINE SULFATE ER 60 MG PO TBCR
60.0000 mg | EXTENDED_RELEASE_TABLET | Freq: Two times a day (BID) | ORAL | 0 refills | Status: DC
  Filled 2024-04-18: qty 60, 30d supply, fill #0

## 2024-04-19 ENCOUNTER — Ambulatory Visit: Admitting: Physical Therapy

## 2024-04-19 ENCOUNTER — Encounter: Payer: Self-pay | Admitting: Physical Therapy

## 2024-04-19 ENCOUNTER — Encounter (HOSPITAL_BASED_OUTPATIENT_CLINIC_OR_DEPARTMENT_OTHER): Admitting: Internal Medicine

## 2024-04-19 DIAGNOSIS — X58XXXA Exposure to other specified factors, initial encounter: Secondary | ICD-10-CM | POA: Diagnosis not present

## 2024-04-19 DIAGNOSIS — R262 Difficulty in walking, not elsewhere classified: Secondary | ICD-10-CM

## 2024-04-19 DIAGNOSIS — Z933 Colostomy status: Secondary | ICD-10-CM | POA: Diagnosis not present

## 2024-04-19 DIAGNOSIS — M6281 Muscle weakness (generalized): Secondary | ICD-10-CM

## 2024-04-19 DIAGNOSIS — S31000A Unspecified open wound of lower back and pelvis without penetration into retroperitoneum, initial encounter: Secondary | ICD-10-CM | POA: Diagnosis not present

## 2024-04-19 DIAGNOSIS — C20 Malignant neoplasm of rectum: Secondary | ICD-10-CM

## 2024-04-19 DIAGNOSIS — L598 Other specified disorders of the skin and subcutaneous tissue related to radiation: Secondary | ICD-10-CM | POA: Diagnosis not present

## 2024-04-19 DIAGNOSIS — Z85048 Personal history of other malignant neoplasm of rectum, rectosigmoid junction, and anus: Secondary | ICD-10-CM | POA: Diagnosis not present

## 2024-04-19 DIAGNOSIS — M4628 Osteomyelitis of vertebra, sacral and sacrococcygeal region: Secondary | ICD-10-CM | POA: Diagnosis not present

## 2024-04-19 DIAGNOSIS — E11622 Type 2 diabetes mellitus with other skin ulcer: Secondary | ICD-10-CM | POA: Diagnosis not present

## 2024-04-19 DIAGNOSIS — R2681 Unsteadiness on feet: Secondary | ICD-10-CM

## 2024-04-19 LAB — GLUCOSE, CAPILLARY: Glucose-Capillary: 215 mg/dL — ABNORMAL HIGH (ref 70–99)

## 2024-04-19 NOTE — Therapy (Signed)
 OUTPATIENT PHYSICAL THERAPY LOWER EXTREMITY TREATMENT    Patient Name: Steven [name] MRN: [id number] DOB:[birth year], 53 y.o., male Today's Date: 04/19/2024  END OF SESSION:  PT End of Session - 04/19/24 1513     Visit Number 9    Date for PT Re-Evaluation 06/01/24    PT Start Time 1515    PT Stop Time 1600    PT Time Calculation (min) 45 min    Activity Tolerance Patient tolerated treatment well;Patient limited by fatigue    Behavior During Therapy [facility] for tasks assessed/performed              Past Medical History:  Diagnosis Date   Anemia 02/2021   iron    Anxiety    Asthma    Bilateral pulmonary embolism (HCC) 04/24/2019   Dehiscence of anastomosis of large intestine 12/31/2022   Depression    Diabetes mellitus without complication (HCC)    History of kidney stones    HLD (hyperlipidemia)    Hypertension    Kidney stones    Neuromuscular disorder (HCC)    from radiation bi lat   OSA (obstructive sleep apnea)    CPAP   Peripheral vascular disease (HCC)    DVT   rectal ca 02/2021   Sleep apnea    Past Surgical History:  Procedure Laterality Date   APPENDECTOMY  1989   open appy   APPLICATION OF WOUND VAC N/A 12/30/2023   Procedure: APPLICATION, WOUND VAC;  Surgeon: Eletha Boas, MD;  Location: [facility];  Service: General;  Laterality: N/A;   BLADDER REPAIR  01/07/2024   Procedure: Closure of bladder cystotomy/fistula;  Surgeon: Elisabeth Valli BIRCH, MD;  Location: [facility];  Service: Urology;;   COLONOSCOPY  03/05/2021   CYST EXCISION  2011   left neck   DIVERTING ILEOSTOMY N/A 07/17/2021   Procedure: DIVERTING ILEOSTOMY;  Surgeon: Debby Hila, MD;  Location: [facility];  Service: General;  Laterality: N/A;   FLEXIBLE SIGMOIDOSCOPY N/A 04/04/2022   Procedure: FLEXIBLE SIGMOIDOSCOPY WITH POSSIBLE DEBRIDEMENT;  Surgeon: Debby Hila, MD;  Location: [facility];  Service: General;  Laterality: N/A;   INCISION AND DRAINAGE OF WOUND N/A 01/04/2024    Procedure: IRRIGATION AND DEBRIDEMENT WOUND; WOUND VAC REPLACEMENT;  Surgeon: Rubin Calamity, MD;  Location: [facility];  Service: General;  Laterality: N/A;   INCISION AND DRAINAGE PERIRECTAL ABSCESS N/A 12/16/2023   Procedure: DEBRIDEMENT OF POST OP WOUND;  Surgeon: Debby Hila, MD;  Location: [facility];  Service: General;  Laterality: N/A;  I & D PERIONEAL WOUND   IRRIGATION AND DEBRIDEMENT ABSCESS N/A 12/30/2023   Procedure: IRRIGATION AND DEBRIDEMENT ABSCESS;  Surgeon: Eletha Boas, MD;  Location: [facility];  Service: General;  Laterality: N/A;   IRRIGATION AND DEBRIDEMENT ABSCESS N/A 01/01/2024   Procedure: IRRIGATION AND DEBRIDEMENT ABSCESS;  Surgeon: Eletha Boas, MD;  Location: [facility];  Service: General;  Laterality: N/A;   IRRIGATION AND DEBRIDEMENT ABSCESS N/A 01/07/2024   Procedure: IRRIGATION AND DEBRIDEMENT ABSCESS (N/A) - WOUND EXPLORATION AND DEBRIDEMENT.  CHANGE WOUND VAC DRESSING.  IRRIGATION AND DEBRIDEMENT OF ABDOMINAL WALL ABSCESS.;  Surgeon: Rubin Calamity, MD;  Location: [facility];  Service: General;  Laterality: N/A;   IRRIGATION AND DEBRIDEMENT ABSCESS N/A 01/11/2024   Procedure: EUA, WOUND VAC CHANGE, EXCISIONAL DEBRIDEMENT OF TISSUE,  MUSCLE AND PERINIEUM.;  Surgeon: Tanda Locus, MD;  Location: [facility];  Service: General;  Laterality: N/A;   RECTAL EXAM UNDER ANESTHESIA N/A 04/04/2022   Procedure: ANAL EXAM UNDER ANESTHESIA;  Surgeon: Debby Hila, MD;  Location[facility];  Service: General;  Laterality: N/A;   RECTAL EXAM UNDER ANESTHESIA N/A 12/25/2023   Procedure: EXAM UNDER ANESTHESIA, RECTUM;  Surgeon: Vanderbilt Debby, MD;  Location: [facility];  Service: General;  Laterality: N/A;   RECTOPEXY N/A 12/02/2023   Procedure: Perineal Proctatectomy;  Surgeon: Debby Hila, MD;  Location: [facility];  Service: General;  Laterality: N/A;  Completion Proctatectomy   WISDOM TOOTH EXTRACTION     age 71   XI ROBOTIC ASSISTED LOWER ANTERIOR RESECTION N/A 07/17/2021    Procedure: XI ROBOTIC ASSISTED LOWER ANTERIOR RESECTION;  Surgeon: Debby Hila, MD;  Location: [facility];  Service: General;  Laterality: N/A;   XI ROBOTIC ASSISTED LOWER ANTERIOR RESECTION N/A 12/31/2022   Procedure: ROBOTIC ASSISTED LOWER ANTERIOR RESECTION WITH OSTOMY with intraop ICG guided perfusion, takedown of iliostomy;  Surgeon: Debby Hila, MD;  Location: [facility];  Service: General;  Laterality: N/A;   Patient Active Problem List   Diagnosis Date Noted   Symptomatic anemia 03/03/2024   Goals of care, counseling/discussion 01/10/2024   Fistula 01/09/2024   History of rectal cancer 01/09/2024   Disruption of perineal wound in male 01/06/2024   Pain 01/06/2024   Post-operative hemoglobin drop 01/05/2024   High risk medication use 01/05/2024   Need for emotional support 01/05/2024   Counseling and coordination of care 01/05/2024   Medication management 01/05/2024   Palliative care encounter 01/05/2024   Anemia due to acute blood loss 12/25/2023   Necrosis of surgical wound (HCC) 12/15/2023   AKI (acute kidney injury) (HCC) 11/30/2023   Chronic pelvic abscess due to rectal stump disruption 11/29/2023   Parastomal hernia without obstruction or gangrene 11/29/2023   Colostomy in place [facility]) 11/29/2023   History of pulmonary embolus (PE) 11/29/2023   Chronic anticoagulation 11/29/2023   Intraperitoneal abscess (HCC) 11/29/2023   Peripheral neuropathy due to chemotherapy (HCC) 07/07/2022   Cancer related pain 04/30/2021   Tenesmus 04/16/2021   Chemotherapy-induced nausea 04/03/2021   Adenocarcinoma of rectum (HCC) 03/21/2021   Sensorineural hearing loss (SNHL) of both ears 01/21/2021   Gastroesophageal reflux disease 12/26/2020   Subjective hearing loss 12/18/2020   Uncontrolled diabetes mellitus with hyperglycemia (HCC) 04/26/2019   Obesity (BMI 30-39.9) 04/26/2019   Asthma    Anxiety    OSA (obstructive sleep apnea)    HLD (hyperlipidemia)     PCP: Arloa Fallow  MD   REFERRING PROVIDER: Thresa Marylynn Phi, NP  REFERRING DIAG: [phone] (ICD-10-CM) - Non-pressure chronic ulcer of back with unspecified severity R26.2 (ICD-10-CM) - Difficulty in walking, not elsewhere classified  THERAPY DIAG:  Muscle weakness (generalized)  Difficulty in walking, not elsewhere classified  Unsteadiness on feet  Rationale for Evaluation and Treatment: Rehabilitation  ONSET DATE: March 2025 (surgeries), cancer started 2022  SUBJECTIVE:   SUBJECTIVE STATEMENT:   Not too bad, trying yo do more at home without walker    EVAL:  I spent 2-3 months in the hospital laying in bed not able to really get up. Have a hx of rectal cancer with multiple surgeries to remove this and additional tissues, then had a blockage and ended up back in the hospital and more surgeries. At the end of this got some AIR at [facility] but it didn't add up to much, they weren't that aggressive. It was difficult to get around after getting home, now able to stand and transfer. Getting hyperbaric therapy at [facility]. Using a waffle cushion in the chair now for  wound/pressure relief, have to shift a lot. Need to build strength and stamina in LEs.   PERTINENT HISTORY: See above  PAIN:  Are you having pain? Yes: NPRS scale: 1-2/10 Pain location: wound  Pain description: soreness  Aggravating factors: pressure on wound, general soreness comes and goes  Relieving factors: offloading, general soreness comes and goes    PRECAUTIONS: Other: suprapubic cath, fistula in urethra, sacral wound, fall risk, colostomy, PICC  RED FLAGS: None   WEIGHT BEARING RESTRICTIONS: No  FALLS:  Has patient fallen in last 6 months? Yes. Number of falls 1 trying to get to bathroom, lower to ground situation with spouse   LIVING ENVIRONMENT: Lives with: lives with their spouse Lives in: House/apartment Stairs: stays on first floor/does not go upstairs for now  Has following equipment at home: Environmental consultant - 2 wheeled,  Wheelchair (manual), Grab bars, and Ramped entry  OCCUPATION: software develop- now on medical leave   PLOF: Independent, Independent with basic ADLs, Independent with gait, and Independent with transfers  PATIENT GOALS: improve strength, walk, improving balance, get back to PLOF   NEXT MD VISIT: PCP PRN   OBJECTIVE:  Note: Objective measures were completed at Evaluation unless otherwise noted.    PATIENT SURVEYS:    Patient-Specific Activity Scoring Scheme  0 represents "unable to perform." 10 represents "able to perform at prior level. 0 1 2 3 4 5 6 7 8 9  10 (Date and Score)   Activity Eval     1. Walking   4    2. Steps  0     3.     4.    5.    Score 2    Total score = sum of the activity scores/number of activities Minimum detectable change (90%CI) for average score = 2 points Minimum detectable change (90%CI) for single activity score = 3 points     COGNITION: Overall cognitive status: Within functional limits for tasks assessed     SENSATION: Not tested      LOWER EXTREMITY MMT:  MMT Right eval Left eval Right 04/04/24 Left 04/04/24  Hip flexion 3+ 3+ 3 3  Hip extension      Hip abduction      Hip adduction      Hip internal rotation      Hip external rotation      Knee flexion 4 4    Knee extension 3- 4 4+ 4+  Ankle dorsiflexion 3 3 4 4   Ankle plantarflexion      Ankle inversion      Ankle eversion       (Blank rows = not tested)    FUNCTIONAL TESTS:  5 times sit to stand: 18.7 seconds use of UEs; 04/04/24 35 seconds no UEs limited by knee pain    GAIT: Distance walked: 120ft Assistive device utilized: Walker - 2 wheeled Level of assistance: CGA with WC follow  Comments: slow but  TREATMENT DATE:  04/19/24 Gait 3 laps ~ 368ft without AD GOALS From elevated surface sit to stands LE on airex holding  yellow ball 2x10 Alt 6 in box taps form airex 2x10 6in step ups  10lb shoulder Ext 2x10 Heel raises black bar 2x10 Slant board calf stretch  04/06/24  Attempted step ups on BOSU- after a couple reps, R ankle rolled a little. Stopped activity, able to freely move ankle against resistance though ROM, no discoloration or new edema, able bear wt without increased pain  Forward step ups on 4 inch box on blue pad x10 B Lateral step ups 6 inch step x15 B  Forward step downs 4 inch step x12 B   Alternating toe taps on blue foam pad to small targets x20  Rocker board AP x2 minutes   Walking without RW : 266ft 2# each UE, 256ft 5# each UE     04/04/24  MMT, 5xSTS  Forward step ups onto 4 inch box on blue foam pad x10 B Gait no device, S for safety: 227ft x2 Lateral step ups onto 4 inch box on blue foam pad x10 B Backward step ups 4 inch step x10 B  Forward/backward toe taps on blue foam pad x5 B Side steps  + step over small targets x1 round Side steps on blue foam pad x2 laps  Forward and lateral side steps over full foam rolls x4 rounds   03/30/24  Tandem walking forward and backward x4 laps min guard Side steps on blue foam pad x4 laps  One foot on BOSU/other on ground 3x30 seconds B  Forward and lateral side steps over full and half foam rollers   Walking without walker, close S for safety 12ftx2        PATIENT EDUCATION:  Education details: as above  Person educated: Patient and Spouse Education method: Explanation Education comprehension: verbalized understanding and needs further education  HOME EXERCISE PROGRAM:   Access Code: [email or web address] URL: [email or web address] Date: 03/28/2024 Prepared by: Josette Rough  Exercises - Tandem Stance in Corner  - 1 x daily - 7 x weekly - 1-2 sets - 6 reps - 30 seconds  hold - Tandem Walking with Counter Support  - 1 x daily - 7 x weekly - 1-2 sets - 4-5 reps - Standing Hip Abduction with Resistance at  Thighs  - 1 x daily - 5 x weekly - 1-2 sets - 10 reps - Sit to Stand with Armchair  - 1 x daily - 5 x weekly - 1-2 sets - 5-10 reps  ASSESSMENT:  CLINICAL IMPRESSION:  Again arrives today feeling well- continued working on functional strength, balance, functional activity tolerance. Weakness and fatigue exposes with sit sit to stands and step ups. Postural cues needed with shoulder Ext. Pt had progressed meeting most STG's. ROM/MMT/weight bearing and gait all WNL and tolerated session well. Will continue to progress.     OBJECTIVE IMPAIRMENTS: Abnormal gait, cardiopulmonary status limiting activity, decreased activity tolerance, decreased balance, decreased coordination, decreased knowledge of use of DME, decreased mobility, difficulty walking, decreased strength, and pain.   ACTIVITY LIMITATIONS: sitting, standing, squatting, stairs, transfers, and locomotion level  PARTICIPATION LIMITATIONS: driving, shopping, community activity, occupation, and yard work  PERSONAL FACTORS: Behavior pattern, Fitness, Past/current experiences, Social background, and Time since onset of injury/illness/exacerbation are also affecting patient's functional outcome.   REHAB POTENTIAL: Good  CLINICAL DECISION MAKING: Evolving/moderate complexity  EVALUATION COMPLEXITY: Moderate   GOALS: Goals reviewed with patient? No  SHORT TERM  GOALS: Target date: 04/20/2024   Will be compliant with appropriate progressive HEP  Baseline: Goal status: Met 04/19/24  2.  Will be able to complete 5xSTS in 12 seconds  Baseline:  Goal status: Partly met 12.58 Seconds   3.  Will be able to ambulate household distances LRAD, Mod(I) Baseline:  Goal status: Met 04/19/24  4.  Will be compliant with appropriate offloading schedule to assist in pressure relief/wound healing  Baseline:  Goal status: Met 04/19/24  5.  Will be able to complete all bed mobility with Mod(I) Baseline:  Goal status: INITIAL    LONG TERM  GOALS: Target date: 06/01/2024    MMT to have improved by at least 1 grade all weak groups  Baseline:  Goal status: INITIAL  2.  Will be able to ambulate at least 718ft with LRAD, Mod(I) to improve community access  Baseline:  Goal status: INITIAL  3.  Will score at least 45 on Berg  Baseline:  Goal status: INITIAL  4.  Will be able to navigate full flight of steps and rail, Mod(I) Baseline:  Goal status: INITIAL  5.  PSFS to have improved by at least 3 points  Baseline:  Goal status: INITIAL     PLAN:  PT FREQUENCY: 2x/week  PT DURATION: 12 weeks  PLANNED INTERVENTIONS: 97750- Physical Performance Testing, 97110-Therapeutic exercises, 97530- Therapeutic activity, [id number]- Neuromuscular re-education, 97535- Self Care, [id number]- Manual therapy, [id number]- Gait training, and DME instructions  PLAN FOR NEXT SESSION:  strength, functional transfers, functional activity tolerance, balance. Work in Electronics engineer as able. Careful with step ups onto BOSU due to ankle rolling/instability. CAUTION WITH NUSTEP/BIKE DUE TO SACRAL WOUND   Josette Rough, PT, DPT 04/19/24 3:14 PM

## 2024-04-20 ENCOUNTER — Encounter (HOSPITAL_BASED_OUTPATIENT_CLINIC_OR_DEPARTMENT_OTHER): Admitting: Internal Medicine

## 2024-04-20 DIAGNOSIS — R2681 Unsteadiness on feet: Secondary | ICD-10-CM | POA: Diagnosis not present

## 2024-04-20 DIAGNOSIS — Z933 Colostomy status: Secondary | ICD-10-CM | POA: Diagnosis not present

## 2024-04-20 DIAGNOSIS — X58XXXA Exposure to other specified factors, initial encounter: Secondary | ICD-10-CM | POA: Diagnosis not present

## 2024-04-20 DIAGNOSIS — Z466 Encounter for fitting and adjustment of urinary device: Secondary | ICD-10-CM | POA: Diagnosis not present

## 2024-04-20 DIAGNOSIS — Z923 Personal history of irradiation: Secondary | ICD-10-CM | POA: Diagnosis not present

## 2024-04-20 DIAGNOSIS — R262 Difficulty in walking, not elsewhere classified: Secondary | ICD-10-CM | POA: Diagnosis not present

## 2024-04-20 DIAGNOSIS — C20 Malignant neoplasm of rectum: Secondary | ICD-10-CM | POA: Diagnosis not present

## 2024-04-20 DIAGNOSIS — N360 Urethral fistula: Secondary | ICD-10-CM | POA: Diagnosis not present

## 2024-04-20 DIAGNOSIS — Z85048 Personal history of other malignant neoplasm of rectum, rectosigmoid junction, and anus: Secondary | ICD-10-CM | POA: Diagnosis not present

## 2024-04-20 DIAGNOSIS — L598 Other specified disorders of the skin and subcutaneous tissue related to radiation: Secondary | ICD-10-CM | POA: Diagnosis not present

## 2024-04-20 DIAGNOSIS — E11622 Type 2 diabetes mellitus with other skin ulcer: Secondary | ICD-10-CM | POA: Diagnosis not present

## 2024-04-20 DIAGNOSIS — M6281 Muscle weakness (generalized): Secondary | ICD-10-CM | POA: Diagnosis not present

## 2024-04-20 DIAGNOSIS — M4628 Osteomyelitis of vertebra, sacral and sacrococcygeal region: Secondary | ICD-10-CM | POA: Diagnosis not present

## 2024-04-20 DIAGNOSIS — Z9221 Personal history of antineoplastic chemotherapy: Secondary | ICD-10-CM | POA: Diagnosis not present

## 2024-04-20 DIAGNOSIS — S31000A Unspecified open wound of lower back and pelvis without penetration into retroperitoneum, initial encounter: Secondary | ICD-10-CM | POA: Diagnosis not present

## 2024-04-21 ENCOUNTER — Encounter: Payer: Self-pay | Admitting: Physical Therapy

## 2024-04-21 ENCOUNTER — Ambulatory Visit: Admitting: Physical Therapy

## 2024-04-21 ENCOUNTER — Encounter (HOSPITAL_BASED_OUTPATIENT_CLINIC_OR_DEPARTMENT_OTHER): Admitting: Internal Medicine

## 2024-04-21 DIAGNOSIS — S31000A Unspecified open wound of lower back and pelvis without penetration into retroperitoneum, initial encounter: Secondary | ICD-10-CM | POA: Diagnosis not present

## 2024-04-21 DIAGNOSIS — C20 Malignant neoplasm of rectum: Secondary | ICD-10-CM

## 2024-04-21 DIAGNOSIS — R2681 Unsteadiness on feet: Secondary | ICD-10-CM

## 2024-04-21 DIAGNOSIS — E11622 Type 2 diabetes mellitus with other skin ulcer: Secondary | ICD-10-CM

## 2024-04-21 DIAGNOSIS — M4628 Osteomyelitis of vertebra, sacral and sacrococcygeal region: Secondary | ICD-10-CM | POA: Diagnosis not present

## 2024-04-21 DIAGNOSIS — M6281 Muscle weakness (generalized): Secondary | ICD-10-CM

## 2024-04-21 DIAGNOSIS — L598 Other specified disorders of the skin and subcutaneous tissue related to radiation: Secondary | ICD-10-CM | POA: Diagnosis not present

## 2024-04-21 DIAGNOSIS — R262 Difficulty in walking, not elsewhere classified: Secondary | ICD-10-CM

## 2024-04-21 DIAGNOSIS — Z933 Colostomy status: Secondary | ICD-10-CM | POA: Diagnosis not present

## 2024-04-21 DIAGNOSIS — Z85048 Personal history of other malignant neoplasm of rectum, rectosigmoid junction, and anus: Secondary | ICD-10-CM | POA: Diagnosis not present

## 2024-04-21 DIAGNOSIS — X58XXXA Exposure to other specified factors, initial encounter: Secondary | ICD-10-CM | POA: Diagnosis not present

## 2024-04-21 LAB — GLUCOSE, CAPILLARY
Glucose-Capillary: 210 mg/dL — ABNORMAL HIGH (ref 70–99)
Glucose-Capillary: 214 mg/dL — ABNORMAL HIGH (ref 70–99)
Glucose-Capillary: 206 mg/dL — ABNORMAL HIGH (ref 70–99)

## 2024-04-21 NOTE — Therapy (Signed)
 OUTPATIENT PHYSICAL THERAPY LOWER EXTREMITY TREATMENT    Patient Name: Steven [name] MRN: [id number] DOB:30-Dec-1970, 53 y.o., male Today's Date: 04/21/2024  END OF SESSION:        Past Medical History:  Diagnosis Date   Anemia 02/2021   iron    Anxiety    Asthma    Bilateral pulmonary embolism (HCC) 04/24/2019   Dehiscence of anastomosis of large intestine 12/31/2022   Depression    Diabetes mellitus without complication (HCC)    History of kidney stones    HLD (hyperlipidemia)    Hypertension    Kidney stones    Neuromuscular disorder (HCC)    from radiation bi lat   OSA (obstructive sleep apnea)    CPAP   Peripheral vascular disease (HCC)    DVT   rectal ca 02/2021   Sleep apnea    Past Surgical History:  Procedure Laterality Date   APPENDECTOMY  1989   open appy   APPLICATION OF WOUND VAC N/A 12/30/2023   Procedure: APPLICATION, WOUND VAC;  Surgeon: Eletha Boas, MD;  Location: [facility];  Service: General;  Laterality: N/A;   BLADDER REPAIR  01/07/2024   Procedure: Closure of bladder cystotomy/fistula;  Surgeon: Elisabeth Valli BIRCH, MD;  Location: [facility];  Service: Urology;;   COLONOSCOPY  03/05/2021   CYST EXCISION  2011   left neck   DIVERTING ILEOSTOMY N/A 07/17/2021   Procedure: DIVERTING ILEOSTOMY;  Surgeon: Debby Hila, MD;  Location: [facility];  Service: General;  Laterality: N/A;   FLEXIBLE SIGMOIDOSCOPY N/A 04/04/2022   Procedure: FLEXIBLE SIGMOIDOSCOPY WITH POSSIBLE DEBRIDEMENT;  Surgeon: Debby Hila, MD;  Location: [facility];  Service: General;  Laterality: N/A;   INCISION AND DRAINAGE OF WOUND N/A 01/04/2024   Procedure: IRRIGATION AND DEBRIDEMENT WOUND; WOUND VAC REPLACEMENT;  Surgeon: Rubin Calamity, MD;  Location: [facility];  Service: General;  Laterality: N/A;   INCISION AND DRAINAGE PERIRECTAL ABSCESS N/A 12/16/2023   Procedure: DEBRIDEMENT OF POST OP WOUND;  Surgeon: Debby Hila, MD;  Location: [facility];  Service: General;   Laterality: N/A;  I & D PERIONEAL WOUND   IRRIGATION AND DEBRIDEMENT ABSCESS N/A 12/30/2023   Procedure: IRRIGATION AND DEBRIDEMENT ABSCESS;  Surgeon: Eletha Boas, MD;  Location: [facility];  Service: General;  Laterality: N/A;   IRRIGATION AND DEBRIDEMENT ABSCESS N/A 01/01/2024   Procedure: IRRIGATION AND DEBRIDEMENT ABSCESS;  Surgeon: Eletha Boas, MD;  Location: [facility];  Service: General;  Laterality: N/A;   IRRIGATION AND DEBRIDEMENT ABSCESS N/A 01/07/2024   Procedure: IRRIGATION AND DEBRIDEMENT ABSCESS (N/A) - WOUND EXPLORATION AND DEBRIDEMENT.  CHANGE WOUND VAC DRESSING.  IRRIGATION AND DEBRIDEMENT OF ABDOMINAL WALL ABSCESS.;  Surgeon: Rubin Calamity, MD;  Location: [facility];  Service: General;  Laterality: N/A;   IRRIGATION AND DEBRIDEMENT ABSCESS N/A 01/11/2024   Procedure: EUA, WOUND VAC CHANGE, EXCISIONAL DEBRIDEMENT OF TISSUE,  MUSCLE AND PERINIEUM.;  Surgeon: Tanda Locus, MD;  Location: [facility];  Service: General;  Laterality: N/A;   RECTAL EXAM UNDER ANESTHESIA N/A 04/04/2022   Procedure: ANAL EXAM UNDER ANESTHESIA;  Surgeon: Debby Hila, MD;  Location[facility];  Service: General;  Laterality: N/A;   RECTAL EXAM UNDER ANESTHESIA N/A 12/25/2023   Procedure: EXAM UNDER ANESTHESIA, RECTUM;  Surgeon: Vanderbilt Debby, MD;  Location[facility];  Service: General;  Laterality: N/A;   RECTOPEXY N/A 12/02/2023   Procedure: Perineal Proctatectomy;  Surgeon: Debby Hila, MD;  Location[facility];  Service: General;  Laterality: N/A;  Completion Proctatectomy  WISDOM TOOTH EXTRACTION     age 53   XI ROBOTIC ASSISTED LOWER ANTERIOR RESECTION N/A 07/17/2021   Procedure: XI ROBOTIC ASSISTED LOWER ANTERIOR RESECTION;  Surgeon: Debby Hila, MD;  Location[facility];  Service: General;  Laterality: N/A;   XI ROBOTIC ASSISTED LOWER ANTERIOR RESECTION N/A 12/31/2022   Procedure: ROBOTIC ASSISTED LOWER ANTERIOR RESECTION WITH OSTOMY with intraop ICG guided perfusion, takedown of  iliostomy;  Surgeon: Debby Hila, MD;  Location: [facility];  Service: General;  Laterality: N/A;   Patient Active Problem List   Diagnosis Date Noted   Symptomatic anemia 03/03/2024   Goals of care, counseling/discussion 01/10/2024   Fistula 01/09/2024   History of rectal cancer 01/09/2024   Disruption of perineal wound in male 01/06/2024   Pain 01/06/2024   Post-operative hemoglobin drop 01/05/2024   High risk medication use 01/05/2024   Need for emotional support 01/05/2024   Counseling and coordination of care 01/05/2024   Medication management 01/05/2024   Palliative care encounter 01/05/2024   Anemia due to acute blood loss 12/25/2023   Necrosis of surgical wound (HCC) 12/15/2023   AKI (acute kidney injury) (HCC) 11/30/2023   Chronic pelvic abscess due to rectal stump disruption 11/29/2023   Parastomal hernia without obstruction or gangrene 11/29/2023   Colostomy in place [facility]) 11/29/2023   History of pulmonary embolus (PE) 11/29/2023   Chronic anticoagulation 11/29/2023   Intraperitoneal abscess (HCC) 11/29/2023   Peripheral neuropathy due to chemotherapy (HCC) 07/07/2022   Cancer related pain 04/30/2021   Tenesmus 04/16/2021   Chemotherapy-induced nausea 04/03/2021   Adenocarcinoma of rectum (HCC) 03/21/2021   Sensorineural hearing loss (SNHL) of both ears 01/21/2021   Gastroesophageal reflux disease 12/26/2020   Subjective hearing loss 12/18/2020   Uncontrolled diabetes mellitus with hyperglycemia (HCC) 04/26/2019   Obesity (BMI 30-39.9) 04/26/2019   Asthma    Anxiety    OSA (obstructive sleep apnea)    HLD (hyperlipidemia)     PCP: Arloa Fallow MD   REFERRING PROVIDER: Thresa Marylynn Phi, NP  REFERRING DIAG: [phone] (ICD-10-CM) - Non-pressure chronic ulcer of back with unspecified severity R26.2 (ICD-10-CM) - Difficulty in walking, not elsewhere classified  THERAPY DIAG:  Muscle weakness (generalized)  Difficulty in walking, not elsewhere  classified  Unsteadiness on feet  Rationale for Evaluation and Treatment: Rehabilitation  ONSET DATE: March 2025 (surgeries), cancer started 2022  SUBJECTIVE:   SUBJECTIVE STATEMENT:   Tired and sleepy was really tired after last session   EVAL:  I spent 2-3 months in the hospital laying in bed not able to really get up. Have a hx of rectal cancer with multiple surgeries to remove this and additional tissues, then had a blockage and ended up back in the hospital and more surgeries. At the end of this got some AIR at [facility] but it didn't add up to much, they weren't that aggressive. It was difficult to get around after getting home, now able to stand and transfer. Getting hyperbaric therapy at [facility]. Using a waffle cushion in the chair now for wound/pressure relief, have to shift a lot. Need to build strength and stamina in LEs.   PERTINENT HISTORY: See above  PAIN:  Are you having pain? Yes: NPRS scale: 0/10 Pain location: wound  Pain description: soreness  Aggravating factors: pressure on wound, general soreness comes and goes  Relieving factors: offloading, general soreness comes and goes    PRECAUTIONS: Other: suprapubic cath, fistula in urethra, sacral wound, fall risk, colostomy, PICC  RED FLAGS: None  WEIGHT BEARING RESTRICTIONS: No  FALLS:  Has patient fallen in last 6 months? Yes. Number of falls 1 trying to get to bathroom, lower to ground situation with spouse   LIVING ENVIRONMENT: Lives with: lives with their spouse Lives in: House/apartment Stairs: stays on first floor/does not go upstairs for now  Has following equipment at home: Environmental consultant - 2 wheeled, Wheelchair (manual), Grab bars, and Ramped entry  OCCUPATION: software develop- now on medical leave   PLOF: Independent, Independent with basic ADLs, Independent with gait, and Independent with transfers  PATIENT GOALS: improve strength, walk, improving balance, get back to PLOF   NEXT MD VISIT: PCP PRN    OBJECTIVE:  Note: Objective measures were completed at Evaluation unless otherwise noted.    PATIENT SURVEYS:    Patient-Specific Activity Scoring Scheme  0 represents "unable to perform." 10 represents "able to perform at prior level. 0 1 2 3 4 5 6 7 8 9  10 (Date and Score)   Activity Eval     1. Walking   4    2. Steps  0     3.     4.    5.    Score 2    Total score = sum of the activity scores/number of activities Minimum detectable change (90%CI) for average score = 2 points Minimum detectable change (90%CI) for single activity score = 3 points     COGNITION: Overall cognitive status: Within functional limits for tasks assessed     SENSATION: Not tested      LOWER EXTREMITY MMT:  MMT Right eval Left eval Right 04/04/24 Left 04/04/24  Hip flexion 3+ 3+ 3 3  Hip extension      Hip abduction      Hip adduction      Hip internal rotation      Hip external rotation      Knee flexion 4 4    Knee extension 3- 4 4+ 4+  Ankle dorsiflexion 3 3 4 4   Ankle plantarflexion      Ankle inversion      Ankle eversion       (Blank rows = not tested)    FUNCTIONAL TESTS:  5 times sit to stand: 18.7 seconds use of UEs; 04/04/24 35 seconds no UEs limited by knee pain    GAIT: Distance walked: 145ft Assistive device utilized: Environmental consultant - 2 wheeled Level of assistance: CGA with WC follow  Comments: slow but                                                                                                                                 TREATMENT DATE:  04/21/24 Gait outside front door around [place] 4 times no AD From elevated surface sit to stands LE on airex  2x10 Lateral 4 in step ups x10  Rows 15lb 2x10  Ext 10lb 2x10 Standing OHP blue ball 2x10 Shoulder ER green 2x10 6in step  ups x5 each  04/19/24 Gait 3 laps ~ 338ft without AD GOALS From elevated surface sit to stands LE on airex holding yellow ball 2x10 Alt 6 in box taps form airex 2x10 6in step  ups  10lb shoulder Ext 2x10 Heel raises black bar 2x10 Slant board calf stretch  04/06/24  Attempted step ups on BOSU- after a couple reps, R ankle rolled a little. Stopped activity, able to freely move ankle against resistance though ROM, no discoloration or new edema, able bear wt without increased pain  Forward step ups on 4 inch box on blue pad x10 B Lateral step ups 6 inch step x15 B  Forward step downs 4 inch step x12 B   Alternating toe taps on blue foam pad to small targets x20  Rocker board AP x2 minutes   Walking without RW : 280ft 2# each UE, 280ft 5# each UE     04/04/24  MMT, 5xSTS  Forward step ups onto 4 inch box on blue foam pad x10 B Gait no device, S for safety: 249ft x2 Lateral step ups onto 4 inch box on blue foam pad x10 B Backward step ups 4 inch step x10 B  Forward/backward toe taps on blue foam pad x5 B Side steps  + step over small targets x1 round Side steps on blue foam pad x2 laps  Forward and lateral side steps over full foam rolls x4 rounds   03/30/24  Tandem walking forward and backward x4 laps min guard Side steps on blue foam pad x4 laps  One foot on BOSU/other on ground 3x30 seconds B  Forward and lateral side steps over full and half foam rollers   Walking without walker, close S for safety 19ftx2        PATIENT EDUCATION:  Education details: as above  Person educated: Patient and Spouse Education method: Explanation Education comprehension: verbalized understanding and needs further education  HOME EXERCISE PROGRAM:   Access Code: [email or web address] URL: [email or web address] Date: 03/28/2024 Prepared by: Josette Rough  Exercises - Tandem Stance in Corner  - 1 x daily - 7 x weekly - 1-2 sets - 6 reps - 30 seconds  hold - Tandem Walking with Counter Support  - 1 x daily - 7 x weekly - 1-2 sets - 4-5 reps - Standing Hip Abduction with Resistance at Thighs  - 1 x daily - 5 x weekly - 1-2 sets - 10 reps - Sit to  Stand with Armchair  - 1 x daily - 5 x weekly - 1-2 sets - 5-10 reps  ASSESSMENT:  CLINICAL IMPRESSION:  Again arrives today feeling well- continued working on functional strength, balance, functional activity tolerance. Again weakness and fatigue exposes with sit sit to stands and step ups. Extended rest periods given between interventions due to fatigue and to eliminate post session fatigue. Postural cues needed with shoulder Ext. Will continue to progress.     OBJECTIVE IMPAIRMENTS: Abnormal gait, cardiopulmonary status limiting activity, decreased activity tolerance, decreased balance, decreased coordination, decreased knowledge of use of DME, decreased mobility, difficulty walking, decreased strength, and pain.   ACTIVITY LIMITATIONS: sitting, standing, squatting, stairs, transfers, and locomotion level  PARTICIPATION LIMITATIONS: driving, shopping, community activity, occupation, and yard work  PERSONAL FACTORS: Behavior pattern, Fitness, Past/current experiences, Social background, and Time since onset of injury/illness/exacerbation are also affecting patient's functional outcome.   REHAB POTENTIAL: Good  CLINICAL DECISION MAKING: Evolving/moderate complexity  EVALUATION COMPLEXITY: Moderate   GOALS: Goals reviewed with patient? No  SHORT  TERM GOALS: Target date: 04/20/2024   Will be compliant with appropriate progressive HEP  Baseline: Goal status: Met 04/19/24  2.  Will be able to complete 5xSTS in 12 seconds  Baseline:  Goal status: Partly met 12.58 Seconds   3.  Will be able to ambulate household distances LRAD, Mod(I) Baseline:  Goal status: Met 04/19/24  4.  Will be compliant with appropriate offloading schedule to assist in pressure relief/wound healing  Baseline:  Goal status: Met 04/19/24  5.  Will be able to complete all bed mobility with Mod(I) Baseline:  Goal status: INITIAL    LONG TERM GOALS: Target date: 06/01/2024    MMT to have improved by at  least 1 grade all weak groups  Baseline:  Goal status: INITIAL  2.  Will be able to ambulate at least 726ft with LRAD, Mod(I) to improve community access  Baseline:  Goal status: INITIAL  3.  Will score at least 45 on Berg  Baseline:  Goal status: INITIAL  4.  Will be able to navigate full flight of steps and rail, Mod(I) Baseline:  Goal status: INITIAL  5.  PSFS to have improved by at least 3 points  Baseline:  Goal status: INITIAL     PLAN:  PT FREQUENCY: 2x/week  PT DURATION: 12 weeks  PLANNED INTERVENTIONS: 97750- Physical Performance Testing, 97110-Therapeutic exercises, 97530- Therapeutic activity, [id number]- Neuromuscular re-education, 97535- Self Care, [id number]- Manual therapy, 97116- Gait training, and DME instructions  PLAN FOR NEXT SESSION:  strength, functional transfers, functional activity tolerance, balance. Work in Electronics engineer as able. Careful with step ups onto BOSU due to ankle rolling/instability. CAUTION WITH NUSTEP/BIKE DUE TO SACRAL WOUND   Tanda Sorrow, PTA 04/21/24 3:14 PM

## 2024-04-22 ENCOUNTER — Telehealth: Payer: Self-pay

## 2024-04-22 ENCOUNTER — Encounter (HOSPITAL_BASED_OUTPATIENT_CLINIC_OR_DEPARTMENT_OTHER): Admitting: Internal Medicine

## 2024-04-22 DIAGNOSIS — C20 Malignant neoplasm of rectum: Secondary | ICD-10-CM | POA: Diagnosis not present

## 2024-04-22 DIAGNOSIS — L598 Other specified disorders of the skin and subcutaneous tissue related to radiation: Secondary | ICD-10-CM | POA: Diagnosis not present

## 2024-04-24 DIAGNOSIS — C20 Malignant neoplasm of rectum: Secondary | ICD-10-CM | POA: Diagnosis not present

## 2024-04-25 ENCOUNTER — Other Ambulatory Visit: Payer: Self-pay | Admitting: Nurse Practitioner

## 2024-04-25 ENCOUNTER — Other Ambulatory Visit: Payer: Self-pay

## 2024-04-25 ENCOUNTER — Inpatient Hospital Stay (HOSPITAL_BASED_OUTPATIENT_CLINIC_OR_DEPARTMENT_OTHER): Admitting: Nurse Practitioner

## 2024-04-25 ENCOUNTER — Inpatient Hospital Stay: Payer: BC Managed Care – PPO | Admitting: Hematology and Oncology

## 2024-04-25 ENCOUNTER — Encounter: Payer: Self-pay | Admitting: Nurse Practitioner

## 2024-04-25 ENCOUNTER — Encounter: Admitting: Physical Therapy

## 2024-04-25 ENCOUNTER — Inpatient Hospital Stay: Payer: BC Managed Care – PPO | Attending: Nurse Practitioner

## 2024-04-25 ENCOUNTER — Other Ambulatory Visit (HOSPITAL_COMMUNITY): Payer: Self-pay

## 2024-04-25 DIAGNOSIS — G893 Neoplasm related pain (acute) (chronic): Secondary | ICD-10-CM | POA: Diagnosis not present

## 2024-04-25 DIAGNOSIS — C20 Malignant neoplasm of rectum: Secondary | ICD-10-CM

## 2024-04-25 DIAGNOSIS — Z515 Encounter for palliative care: Secondary | ICD-10-CM

## 2024-04-25 DIAGNOSIS — R530 Neoplastic (malignant) related fatigue: Secondary | ICD-10-CM

## 2024-04-25 MED ORDER — ESCITALOPRAM OXALATE 10 MG PO TABS: 10.0000 mg | ORAL_TABLET | Freq: Every day | ORAL | 1 refills | Status: DC

## 2024-04-25 MED ORDER — CYCLOBENZAPRINE HCL 10 MG PO TABS: 10.0000 mg | ORAL_TABLET | Freq: Three times a day (TID) | ORAL | 3 refills | Status: AC | PRN

## 2024-04-25 MED ORDER — MORPHINE SULFATE 15 MG PO TABS
7.5000 mg | ORAL_TABLET | ORAL | 0 refills | Status: DC | PRN
  Filled 2024-04-25 – 2024-04-26 (×2): qty 90, 15d supply, fill #0

## 2024-04-25 NOTE — Progress Notes (Signed)
 Palliative Medicine [facility]  Telephone:([phone] Fax:([phone]) [phone]   Name: Steven [name] Date: 04/25/2024 MRN: [id number]  DOB: Jul 10, 1971  Patient Care Team: Arloa Elsie SAUNDERS, MD as PCP - General (Family Medicine) Loretha Ash, MD as Consulting Physician (Hematology and Oncology) Debby Hila, MD as Consulting Physician (General Surgery) Causey, Morna Pickle, NP as Nurse Practitioner (Hematology and Oncology) Dewey Rush, MD as Consulting Physician (Radiation Oncology) Mansouraty, Aloha Raddle., MD as Consulting Physician (Gastroenterology) Elisabeth Valli BIRCH, MD as Consulting Physician (Urology) Pickenpack-Cousar, Fannie SAILOR, NP as Nurse Practitioner [facility] and Palliative Medicine)   I connected with [name] on 04/25/24 at 12:30 PM EDT by telephone and verified that I am speaking with the correct person using two identifiers.   I discussed the limitations, risks, security and privacy concerns of performing an evaluation and management service by telemedicine and the availability of in-person appointments. I also discussed with the patient that there may be a patient responsible charge related to this service. The patient expressed understanding and agreed to proceed.   Other persons participating in the visit and their role in the encounter: N/A   Patient's location: Home   Provider's location: [facility]   INTERVAL HISTORY: Steven [name] is a 53 y.o. male with oncologic medical history including stage II rectal adenocarcinoma s/p chemoradiation (2022), resection with ostomy, chronic pelvic abscess.  Palliative is seeing patient for symptom management and goals of care.   SOCIAL HISTORY:     reports that he has never smoked. He has never used smokeless tobacco. He reports current alcohol use. He reports that he does not use drugs.  ADVANCE DIRECTIVES:  None on file   CODE STATUS: Full code  PAST MEDICAL HISTORY: Past Medical History:   Diagnosis Date   Anemia 02/2021   iron    Anxiety    Asthma    Bilateral pulmonary embolism (HCC) 04/24/2019   Dehiscence of anastomosis of large intestine 12/31/2022   Depression    Diabetes mellitus without complication (HCC)    History of kidney stones    HLD (hyperlipidemia)    Hypertension    Kidney stones    Neuromuscular disorder (HCC)    from radiation bi lat   OSA (obstructive sleep apnea)    CPAP   Peripheral vascular disease (HCC)    DVT   rectal ca 02/2021   Sleep apnea     ALLERGIES:  is allergic to contrast media [iodinated contrast media].  MEDICATIONS:  Current Outpatient Medications  Medication Sig Dispense Refill   albuterol  (PROAIR  HFA) 108 (90 Base) MCG/ACT inhaler Inhale 2 puffs into the lungs every 4 (four) hours as needed for wheezing or shortness of breath.     ALPRAZolam  (XANAX ) 0.5 MG tablet Take 0.5 mg by mouth 2 (two) times daily as needed for anxiety or sleep.     calcium  carbonate (TUMS - DOSED IN MG ELEMENTAL CALCIUM ) 500 MG chewable tablet Chew 2 tablets by mouth daily as needed for indigestion or heartburn.     cyclobenzaprine  (FLEXERIL ) 10 MG tablet Take 1 tablet (10 mg total) by mouth 3 (three) times daily as needed for muscle spasms. 270 tablet 3   diphenhydrAMINE  (BENADRYL ) 25 mg capsule Take 1 capsule (25 mg total) by mouth every 6 (six) hours as needed for itching. (Patient taking differently: Take 25 mg by mouth See admin instructions. Take 25-50 mg by mouth as directed prior to receiving contrast dye as a part of the Benadryl  + Prednisone  pre-treatment  regimen)     escitalopram  (LEXAPRO ) 10 MG tablet Take 1 tablet (10 mg total) by mouth at bedtime. 90 tablet 1   ferrous sulfate  325 (65 FE) MG tablet Take 1 tablet (325 mg total) by mouth 2 (two) times daily with a meal. (Patient not taking: Reported on 03/03/2024)     Gauze Pads & Dressings (COMBINE ABD) 5X9 PADS 1 Units by Does not apply route daily. 30 each 3   Gauze Pads & Dressings  (KERLIX GAUZE ROLL LARGE) MISC 1 Units by Does not apply route daily. 30 each 3   morphine  (MS CONTIN ) 60 MG 12 hr tablet Take 1 tablet (60 mg total) by mouth every 12 (twelve) hours. 60 tablet 0   morphine  (MSIR) 15 MG tablet Take 0.5-1 tablets (7.5-15 mg total) by mouth every 4 (four) hours as needed for moderate pain (pain score 4-6) or severe pain (pain score 7-10). 90 tablet 0   PRESCRIPTION MEDICATION See admin instructions. TPN from [facility] (scanned into the Media tab in Epic by Registration as a document on 03/03/2024) Beginning at 8-9 PM for twelve hours     rivaroxaban  (XARELTO ) 20 MG TABS tablet Take 1 tablet (20 mg total) by mouth daily with supper. (Patient taking differently: Take 20 mg by mouth See admin instructions. Take 20 mg by mouth at 8 AM) 90 tablet 3   Simethicone  (GAS-X PO) Take 1-2 tablets by mouth as needed (gas).     tetrahydrozoline-zinc  (VISINE-AC) 0.05-0.25 % ophthalmic solution Place 2 drops into both eyes 3 (three) times daily as needed (for allergies).     TYLENOL  500 MG tablet Take 500-1,000 mg by mouth See admin instructions. Take 1,000 mg by mouth at 6 AM, 10 AM, and 6 PM AND 500 mg at 2 PM     No current facility-administered medications for this visit.    VITAL SIGNS: There were no vitals taken for this visit. There were no vitals filed for this visit.  Estimated body mass index is 28.47 kg/m as calculated from the following:   Height as of 03/04/24: 5' 10 (1.778 m).   Weight as of 03/04/24: 198 lb 6.6 oz (90 kg).   PERFORMANCE STATUS (ECOG) : 1 - Symptomatic but completely ambulatory  IMPRESSION: Discussed the use of AI scribe software for clinical note transcription with the patient, who gave verbal consent to proceed.  History of Present Illness I connected by phone with Steven [name] for symptom management follow-up. He is doing well overall. No acute distress. Denies concerns for nausea, vomiting, constipation, or diarrhea.    Patient reports his  pain is well-controlled on current regimen.  He is currently taking MS Contin  every 12 hours in addition to MS IR every 4 hours as needed for breakthrough pain. Flexeril  as needed for muscle spasms.  No adjustments to current regimen at this time. Request 90 day supply for all available medications due to insurance request.   MS IR currently on back order. Will consider options however if unavailable may have to consider change in regimen. Will discuss further with patient at later time once confirmed availability with pharmacy.   We will continue to closely monitor and support.  Goals of Care 02/11/24: We discussed his current illness and what it means in the larger context of ihs on-going co-morbidities. Natural disease trajectory and expectations were discussed. Steven [name] and his wife are realistic in their understanding of current conditions and plan of care. He is remaining hopeful for stability and  improvement in the near future. He is emotional expressing his decreased quality of life and health challenges. Continues to take things one day at a time as his goals are clear to continue to treat the treatable aggressively allowing him every opportunity to thrive while managing symptoms.   I discussed the importance of continued conversation with family and their medical providers regarding overall plan of care and treatment options, ensuring decisions are within the context of the patients values and GOCs. Assessment & Plan Muscle Spasms He tolerates flexeril  well, compared to robaxin . With effective management of spasms.  Cancer Related pain management Pain from post-surgical wound managed with morphine . Pain controlled but not fully alleviated. Side effects include somnolence and diaphoresis.  - Continue morphine  extended release 60 mg every 12 hours. - Continue morphine  immediate release 15 mg every 4 hours as needed. Currently on backorder. Will consider changing regimen if medication remains  unavailable.  - Refill prescriptions for morphine  and other medications. -Flexeril  10mg  every 8 hours as needed.    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 TPN administered at night. Home health assists with TPN management. - Continue TPN administration at night for 12 hours. - Coordinate with home health for TPN management and dressing changes.   Depression Depression managed with Lexapro . No current issues reported. - Continue Lexapro  10 mg at bedtime. - Refill Lexapro  prescription.  Follow-up Follow-up planned to reassess management and address medication needs. - Schedule follow-up in 4-6 weeks. Sooner if needed.  - Contact provider if refills needed before appointment.  Patient expressed understanding and was in agreement with this plan. He also understands that He can call the clinic at any time with any questions, concerns, or complaints.   Any controlled substances utilized were prescribed in the context of palliative care. PDMP has been reviewed.   I provided 25 minutes of non face-to-face telephone visit time during this encounter, and > 50% was spent counseling as documented under my assessment & plan. Visit consisted of counseling and education dealing with the complex and emotionally intense issues of symptom management and palliative care in the setting of serious and potentially life-threatening illness.  Steven [name], AGPCNP-BC  Palliative Medicine Team/[facility]

## 2024-04-26 ENCOUNTER — Other Ambulatory Visit: Payer: Self-pay

## 2024-04-26 ENCOUNTER — Other Ambulatory Visit (HOSPITAL_COMMUNITY): Payer: Self-pay

## 2024-04-26 ENCOUNTER — Encounter (HOSPITAL_BASED_OUTPATIENT_CLINIC_OR_DEPARTMENT_OTHER): Admitting: Internal Medicine

## 2024-04-26 DIAGNOSIS — S31000A Unspecified open wound of lower back and pelvis without penetration into retroperitoneum, initial encounter: Secondary | ICD-10-CM | POA: Diagnosis not present

## 2024-04-26 DIAGNOSIS — C20 Malignant neoplasm of rectum: Secondary | ICD-10-CM | POA: Diagnosis not present

## 2024-04-26 DIAGNOSIS — X58XXXA Exposure to other specified factors, initial encounter: Secondary | ICD-10-CM | POA: Diagnosis not present

## 2024-04-26 DIAGNOSIS — M4628 Osteomyelitis of vertebra, sacral and sacrococcygeal region: Secondary | ICD-10-CM | POA: Diagnosis not present

## 2024-04-26 DIAGNOSIS — Z933 Colostomy status: Secondary | ICD-10-CM | POA: Diagnosis not present

## 2024-04-26 DIAGNOSIS — L598 Other specified disorders of the skin and subcutaneous tissue related to radiation: Secondary | ICD-10-CM | POA: Diagnosis not present

## 2024-04-26 DIAGNOSIS — Z85048 Personal history of other malignant neoplasm of rectum, rectosigmoid junction, and anus: Secondary | ICD-10-CM | POA: Diagnosis not present

## 2024-04-26 DIAGNOSIS — E11622 Type 2 diabetes mellitus with other skin ulcer: Secondary | ICD-10-CM | POA: Diagnosis not present

## 2024-04-26 LAB — GLUCOSE, CAPILLARY
Glucose-Capillary: 204 mg/dL — ABNORMAL HIGH (ref 70–99)
Glucose-Capillary: 223 mg/dL — ABNORMAL HIGH (ref 70–99)

## 2024-04-27 ENCOUNTER — Encounter (HOSPITAL_BASED_OUTPATIENT_CLINIC_OR_DEPARTMENT_OTHER): Admitting: Internal Medicine

## 2024-04-27 DIAGNOSIS — C20 Malignant neoplasm of rectum: Secondary | ICD-10-CM | POA: Diagnosis not present

## 2024-04-27 DIAGNOSIS — L598 Other specified disorders of the skin and subcutaneous tissue related to radiation: Secondary | ICD-10-CM | POA: Diagnosis not present

## 2024-04-28 ENCOUNTER — Telehealth: Payer: Self-pay | Admitting: Nurse Practitioner

## 2024-04-28 ENCOUNTER — Encounter (HOSPITAL_BASED_OUTPATIENT_CLINIC_OR_DEPARTMENT_OTHER): Admitting: Internal Medicine

## 2024-04-28 ENCOUNTER — Ambulatory Visit: Admitting: Physical Therapy

## 2024-04-28 ENCOUNTER — Encounter: Payer: Self-pay | Admitting: Physical Therapy

## 2024-04-28 DIAGNOSIS — Z933 Colostomy status: Secondary | ICD-10-CM | POA: Diagnosis not present

## 2024-04-28 DIAGNOSIS — R262 Difficulty in walking, not elsewhere classified: Secondary | ICD-10-CM

## 2024-04-28 DIAGNOSIS — X58XXXA Exposure to other specified factors, initial encounter: Secondary | ICD-10-CM | POA: Diagnosis not present

## 2024-04-28 DIAGNOSIS — M6281 Muscle weakness (generalized): Secondary | ICD-10-CM | POA: Diagnosis not present

## 2024-04-28 DIAGNOSIS — E11622 Type 2 diabetes mellitus with other skin ulcer: Secondary | ICD-10-CM | POA: Diagnosis not present

## 2024-04-28 DIAGNOSIS — L598 Other specified disorders of the skin and subcutaneous tissue related to radiation: Secondary | ICD-10-CM | POA: Diagnosis not present

## 2024-04-28 DIAGNOSIS — R2681 Unsteadiness on feet: Secondary | ICD-10-CM | POA: Diagnosis not present

## 2024-04-28 DIAGNOSIS — S31000A Unspecified open wound of lower back and pelvis without penetration into retroperitoneum, initial encounter: Secondary | ICD-10-CM | POA: Diagnosis not present

## 2024-04-28 DIAGNOSIS — C20 Malignant neoplasm of rectum: Secondary | ICD-10-CM | POA: Diagnosis not present

## 2024-04-28 DIAGNOSIS — M4628 Osteomyelitis of vertebra, sacral and sacrococcygeal region: Secondary | ICD-10-CM | POA: Diagnosis not present

## 2024-04-28 DIAGNOSIS — Z85048 Personal history of other malignant neoplasm of rectum, rectosigmoid junction, and anus: Secondary | ICD-10-CM | POA: Diagnosis not present

## 2024-04-28 LAB — GLUCOSE, CAPILLARY: Glucose-Capillary: 162 mg/dL — ABNORMAL HIGH (ref 70–99)

## 2024-04-28 NOTE — Telephone Encounter (Signed)
 Scheduled appointment per 7/28 los. Talked with the patient and he is aware of the made appointment.

## 2024-04-28 NOTE — Therapy (Signed)
 OUTPATIENT PHYSICAL THERAPY LOWER EXTREMITY TREATMENT    Patient Name: Steven [name] MRN: [id number] DOB:May 09, 1971, 53 y.o., male Today's Date: 04/28/2024  END OF SESSION:  PT End of Session - 04/28/24 1518     Visit Number 10    Date for PT Re-Evaluation 06/01/24    PT Start Time 1518    PT Stop Time 1600    PT Time Calculation (min) 42 min    Activity Tolerance Patient tolerated treatment well;Patient limited by fatigue    Behavior During Therapy [facility] for tasks assessed/performed               Past Medical History:  Diagnosis Date   Anemia 02/2021   iron    Anxiety    Asthma    Bilateral pulmonary embolism (HCC) 04/24/2019   Dehiscence of anastomosis of large intestine 12/31/2022   Depression    Diabetes mellitus without complication (HCC)    History of kidney stones    HLD (hyperlipidemia)    Hypertension    Kidney stones    Neuromuscular disorder (HCC)    from radiation bi lat   OSA (obstructive sleep apnea)    CPAP   Peripheral vascular disease (HCC)    DVT   rectal ca 02/2021   Sleep apnea    Past Surgical History:  Procedure Laterality Date   APPENDECTOMY  1989   open appy   APPLICATION OF WOUND VAC N/A 12/30/2023   Procedure: APPLICATION, WOUND VAC;  Surgeon: Eletha Boas, MD;  Location: [facility];  Service: General;  Laterality: N/A;   BLADDER REPAIR  01/07/2024   Procedure: Closure of bladder cystotomy/fistula;  Surgeon: Elisabeth Valli BIRCH, MD;  Location: [facility];  Service: Urology;;   COLONOSCOPY  03/05/2021   CYST EXCISION  2011   left neck   DIVERTING ILEOSTOMY N/A 07/17/2021   Procedure: DIVERTING ILEOSTOMY;  Surgeon: Debby Hila, MD;  Location: [facility];  Service: General;  Laterality: N/A;   FLEXIBLE SIGMOIDOSCOPY N/A 04/04/2022   Procedure: FLEXIBLE SIGMOIDOSCOPY WITH POSSIBLE DEBRIDEMENT;  Surgeon: Debby Hila, MD;  Location: [facility] [place];  Service: General;  Laterality: N/A;   INCISION AND DRAINAGE OF WOUND N/A  01/04/2024   Procedure: IRRIGATION AND DEBRIDEMENT WOUND; WOUND VAC REPLACEMENT;  Surgeon: Rubin Calamity, MD;  Location: [facility];  Service: General;  Laterality: N/A;   INCISION AND DRAINAGE PERIRECTAL ABSCESS N/A 12/16/2023   Procedure: DEBRIDEMENT OF POST OP WOUND;  Surgeon: Debby Hila, MD;  Location: [facility];  Service: General;  Laterality: N/A;  I & D PERIONEAL WOUND   IRRIGATION AND DEBRIDEMENT ABSCESS N/A 12/30/2023   Procedure: IRRIGATION AND DEBRIDEMENT ABSCESS;  Surgeon: Eletha Boas, MD;  Location: [facility];  Service: General;  Laterality: N/A;   IRRIGATION AND DEBRIDEMENT ABSCESS N/A 01/01/2024   Procedure: IRRIGATION AND DEBRIDEMENT ABSCESS;  Surgeon: Eletha Boas, MD;  Location: [facility];  Service: General;  Laterality: N/A;   IRRIGATION AND DEBRIDEMENT ABSCESS N/A 01/07/2024   Procedure: IRRIGATION AND DEBRIDEMENT ABSCESS (N/A) - WOUND EXPLORATION AND DEBRIDEMENT.  CHANGE WOUND VAC DRESSING.  IRRIGATION AND DEBRIDEMENT OF ABDOMINAL WALL ABSCESS.;  Surgeon: Rubin Calamity, MD;  Location: [facility];  Service: General;  Laterality: N/A;   IRRIGATION AND DEBRIDEMENT ABSCESS N/A 01/11/2024   Procedure: EUA, WOUND VAC CHANGE, EXCISIONAL DEBRIDEMENT OF TISSUE,  MUSCLE AND PERINIEUM.;  Surgeon: Tanda Locus, MD;  Location: [facility];  Service: General;  Laterality: N/A;   RECTAL EXAM UNDER ANESTHESIA N/A 04/04/2022   Procedure: ANAL EXAM UNDER ANESTHESIA;  Surgeon: Debby Hila, MD;  Location[facility];  Service: General;  Laterality: N/A;   RECTAL EXAM UNDER ANESTHESIA N/A 12/25/2023   Procedure: EXAM UNDER ANESTHESIA, RECTUM;  Surgeon: Vanderbilt Debby, MD;  Location: [facility];  Service: General;  Laterality: N/A;   RECTOPEXY N/A 12/02/2023   Procedure: Perineal Proctatectomy;  Surgeon: Debby Hila, MD;  Location: [facility];  Service: General;  Laterality: N/A;  Completion Proctatectomy   WISDOM TOOTH EXTRACTION     age 14   XI ROBOTIC ASSISTED LOWER ANTERIOR RESECTION N/A  07/17/2021   Procedure: XI ROBOTIC ASSISTED LOWER ANTERIOR RESECTION;  Surgeon: Debby Hila, MD;  Location: [facility];  Service: General;  Laterality: N/A;   XI ROBOTIC ASSISTED LOWER ANTERIOR RESECTION N/A 12/31/2022   Procedure: ROBOTIC ASSISTED LOWER ANTERIOR RESECTION WITH OSTOMY with intraop ICG guided perfusion, takedown of iliostomy;  Surgeon: Debby Hila, MD;  Location: [facility];  Service: General;  Laterality: N/A;   Patient Active Problem List   Diagnosis Date Noted   Symptomatic anemia 03/03/2024   Goals of care, counseling/discussion 01/10/2024   Fistula 01/09/2024   History of rectal cancer 01/09/2024   Disruption of perineal wound in male 01/06/2024   Pain 01/06/2024   Post-operative hemoglobin drop 01/05/2024   High risk medication use 01/05/2024   Need for emotional support 01/05/2024   Counseling and coordination of care 01/05/2024   Medication management 01/05/2024   Palliative care encounter 01/05/2024   Anemia due to acute blood loss 12/25/2023   Necrosis of surgical wound (HCC) 12/15/2023   AKI (acute kidney injury) (HCC) 11/30/2023   Chronic pelvic abscess due to rectal stump disruption 11/29/2023   Parastomal hernia without obstruction or gangrene 11/29/2023   Colostomy in place [facility]) 11/29/2023   History of pulmonary embolus (PE) 11/29/2023   Chronic anticoagulation 11/29/2023   Intraperitoneal abscess (HCC) 11/29/2023   Peripheral neuropathy due to chemotherapy (HCC) 07/07/2022   Cancer related pain 04/30/2021   Tenesmus 04/16/2021   Chemotherapy-induced nausea 04/03/2021   Adenocarcinoma of rectum (HCC) 03/21/2021   Sensorineural hearing loss (SNHL) of both ears 01/21/2021   Gastroesophageal reflux disease 12/26/2020   Subjective hearing loss 12/18/2020   Uncontrolled diabetes mellitus with hyperglycemia (HCC) 04/26/2019   Obesity (BMI 30-39.9) 04/26/2019   Asthma    Anxiety    OSA (obstructive sleep apnea)    HLD (hyperlipidemia)     PCP:  Arloa Fallow MD   REFERRING PROVIDER: Thresa Marylynn Phi, NP  REFERRING DIAG: [phone] (ICD-10-CM) - Non-pressure chronic ulcer of back with unspecified severity R26.2 (ICD-10-CM) - Difficulty in walking, not elsewhere classified  THERAPY DIAG:  Muscle weakness (generalized)  Difficulty in walking, not elsewhere classified  Unsteadiness on feet  Rationale for Evaluation and Treatment: Rehabilitation  ONSET DATE: March 2025 (surgeries), cancer started 2022  SUBJECTIVE:   SUBJECTIVE STATEMENT:   Relying less and less on the walker at home. Does use it when he goes out   EVAL:  I spent 2-3 months in the hospital laying in bed not able to really get up. Have a hx of rectal cancer with multiple surgeries to remove this and additional tissues, then had a blockage and ended up back in the hospital and more surgeries. At the end of this got some AIR at [facility] but it didn't add up to much, they weren't that aggressive. It was difficult to get around after getting home, now able to stand and transfer. Getting hyperbaric therapy at [facility] Using a waffle cushion in  the chair now for wound/pressure relief, have to shift a lot. Need to build strength and stamina in LEs.   PERTINENT HISTORY: See above  PAIN:  Are you having pain? Yes: NPRS scale: 0/10 Pain location: wound  Pain description: soreness  Aggravating factors: pressure on wound, general soreness comes and goes  Relieving factors: offloading, general soreness comes and goes    PRECAUTIONS: Other: suprapubic cath, fistula in urethra, sacral wound, fall risk, colostomy, PICC  RED FLAGS: None   WEIGHT BEARING RESTRICTIONS: No  FALLS:  Has patient fallen in last 6 months? Yes. Number of falls 1 trying to get to bathroom, lower to ground situation with spouse   LIVING ENVIRONMENT: Lives with: lives with their spouse Lives in: House/apartment Stairs: stays on first floor/does not go upstairs for now  Has following equipment at  home: Environmental consultant - 2 wheeled, Wheelchair (manual), Grab bars, and Ramped entry  OCCUPATION: software develop- now on medical leave   PLOF: Independent, Independent with basic ADLs, Independent with gait, and Independent with transfers  PATIENT GOALS: improve strength, walk, improving balance, get back to PLOF   NEXT MD VISIT: PCP PRN   OBJECTIVE:  Note: Objective measures were completed at Evaluation unless otherwise noted.    PATIENT SURVEYS:    Patient-Specific Activity Scoring Scheme  0 represents "unable to perform." 10 represents "able to perform at prior level. 0 1 2 3 4 5 6 7 8 9  10 (Date and Score)   Activity Eval     1. Walking   4    2. Steps  0     3.     4.    5.    Score 2    Total score = sum of the activity scores/number of activities Minimum detectable change (90%CI) for average score = 2 points Minimum detectable change (90%CI) for single activity score = 3 points     COGNITION: Overall cognitive status: Within functional limits for tasks assessed     SENSATION: Not tested      LOWER EXTREMITY MMT:  MMT Right eval Left eval Right 04/04/24 Left 04/04/24 Right 12/28/23 Left 3/71/25  Hip flexion 3+ 3+ 3 3 4+ 4-  Hip extension        Hip abduction        Hip adduction        Hip internal rotation        Hip external rotation        Knee flexion 4 4   4+ 4+  Knee extension 3- 4 4+ 4+ 5 5  Ankle dorsiflexion 3 3 4 4 3 3   Ankle plantarflexion        Ankle inversion        Ankle eversion         (Blank rows = not tested)    FUNCTIONAL TESTS:  5 times sit to stand: 18.7 seconds use of UEs; 04/04/24 35 seconds no UEs limited by knee pain    GAIT: Distance walked: 153ft Assistive device utilized: Walker - 2 wheeled Level of assistance: CGA with WC follow  Comments: slow but  TREATMENT DATE:  04/28/24 Gait on  Tmill 1.5% incline progressed to 1.3 MPH 2:31 min Goals  6in step ups x10 each  Alt 8in box taps from airex Onairex w/ ball toss  6in step ups form airex 6in step downs x10, x5 Stair descents 6in Shoulder Ext 10lb 2x10 Hell raises 2x10 Slant board calf stretch  04/21/24 Gait outside front door around [place] 4 times no AD From elevated surface sit to stands LE on airex  2x10 Lateral 4 in step ups x10  Rows 15lb 2x10  Ext 10lb 2x10 Standing OHP blue ball 2x10 Shoulder ER green 2x10 6in step ups x5 each  04/19/24 Gait 3 laps ~ 344ft without AD GOALS From elevated surface sit to stands LE on airex holding yellow ball 2x10 Alt 6 in box taps form airex 2x10 6in step ups  10lb shoulder Ext 2x10 Heel raises black bar 2x10 Slant board calf stretch  04/06/24  Attempted step ups on BOSU- after a couple reps, R ankle rolled a little. Stopped activity, able to freely move ankle against resistance though ROM, no discoloration or new edema, able bear wt without increased pain  Forward step ups on 4 inch box on blue pad x10 B Lateral step ups 6 inch step x15 B  Forward step downs 4 inch step x12 B   Alternating toe taps on blue foam pad to small targets x20  Rocker board AP x2 minutes   Walking without RW : 267ft 2# each UE, 232ft 5# each UE     04/04/24  MMT, 5xSTS  Forward step ups onto 4 inch box on blue foam pad x10 B Gait no device, S for safety: 233ft x2 Lateral step ups onto 4 inch box on blue foam pad x10 B Backward step ups 4 inch step x10 B  Forward/backward toe taps on blue foam pad x5 B Side steps  + step over small targets x1 round Side steps on blue foam pad x2 laps  Forward and lateral side steps over full foam rolls x4 rounds   03/30/24  Tandem walking forward and backward x4 laps min guard Side steps on blue foam pad x4 laps  One foot on BOSU/other on ground 3x30 seconds B  Forward and lateral side steps over full and half foam rollers   Walking  without walker, close S for safety 16ftx2        PATIENT EDUCATION:  Education details: as above  Person educated: Patient and Spouse Education method: Explanation Education comprehension: verbalized understanding and needs further education  HOME EXERCISE PROGRAM:   Access Code: [email or web address] URL: [email or web address] Date: 03/28/2024 Prepared by: Josette Rough  Exercises - Tandem Stance in Corner  - 1 x daily - 7 x weekly - 1-2 sets - 6 reps - 30 seconds  hold - Tandem Walking with Counter Support  - 1 x daily - 7 x weekly - 1-2 sets - 4-5 reps - Standing Hip Abduction with Resistance at Thighs  - 1 x daily - 5 x weekly - 1-2 sets - 10 reps - Sit to Stand with Armchair  - 1 x daily - 5 x weekly - 1-2 sets - 5-10 reps  ASSESSMENT:  CLINICAL IMPRESSION:  Again arrives today feeling well- continued working on functional strength, balance, functional activity tolerance. He has progressed meeting al STG.  Pt has progresses increasing her LE strength. Bilateral DF weakness remains.  Pt did voice some difficulty descending stairs due to perception and weakness. Time spent with eccentric  stair descents.  Extended rest periods given between interventions due to fatigue and to eliminate post session fatigue.  Postural cues needed with shoulder Ext. Will continue to progress.     OBJECTIVE IMPAIRMENTS: Abnormal gait, cardiopulmonary status limiting activity, decreased activity tolerance, decreased balance, decreased coordination, decreased knowledge of use of DME, decreased mobility, difficulty walking, decreased strength, and pain.   ACTIVITY LIMITATIONS: sitting, standing, squatting, stairs, transfers, and locomotion level  PARTICIPATION LIMITATIONS: driving, shopping, community activity, occupation, and yard work  PERSONAL FACTORS: Behavior pattern, Fitness, Past/current experiences, Social background, and Time since onset of injury/illness/exacerbation are also affecting  patient's functional outcome.   REHAB POTENTIAL: Good  CLINICAL DECISION MAKING: Evolving/moderate complexity  EVALUATION COMPLEXITY: Moderate   GOALS: Goals reviewed with patient? No  SHORT TERM GOALS: Target date: 04/20/2024   Will be compliant with appropriate progressive HEP  Baseline: Goal status: Met 04/19/24  2.  Will be able to complete 5xSTS in 12 seconds  Baseline:  Goal status: Partly met 12.58 Seconds,  Met 04/28/24 11.45  3.  Will be able to ambulate household distances LRAD, Mod(I) Baseline:  Goal status: Met 04/19/24  4.  Will be compliant with appropriate offloading schedule to assist in pressure relief/wound healing  Baseline:  Goal status: Met 04/19/24  5.  Will be able to complete all bed mobility with Mod(I) Baseline:  Goal status: Met 04/28/24    LONG TERM GOALS: Target date: 06/01/2024    MMT to have improved by at least 1 grade all weak groups  Baseline:  Goal status: Progressing 04/28/24  2.  Will be able to ambulate at least 729ft with LRAD, Mod(I) to improve community access  Baseline:  Goal status: INITIAL  3.  Will score at least 45 on Berg  Baseline:  Goal status: INITIAL  4.  Will be able to navigate full flight of steps and rail, Mod(I) Baseline:  Goal status: INITIAL  5.  PSFS to have improved by at least 3 points  Baseline:  Goal status: INITIAL     PLAN:  PT FREQUENCY: 2x/week  PT DURATION: 12 weeks  PLANNED INTERVENTIONS: 97750- Physical Performance Testing, 97110-Therapeutic exercises, 97530- Therapeutic activity, [id number]- Neuromuscular re-education, 97535- Self Care, [id number]- Manual therapy, [id number]- Gait training, and DME instructions  PLAN FOR NEXT SESSION:  strength, functional transfers, functional activity tolerance, balance. Work in Electronics engineer as able. Careful with step ups onto BOSU due to ankle rolling/instability. CAUTION WITH NUSTEP/BIKE DUE TO SACRAL WOUND   Tanda Sorrow, PTA 04/28/24 3:18  PM

## 2024-04-29 ENCOUNTER — Encounter (HOSPITAL_BASED_OUTPATIENT_CLINIC_OR_DEPARTMENT_OTHER): Attending: Internal Medicine | Admitting: Internal Medicine

## 2024-04-29 DIAGNOSIS — C20 Malignant neoplasm of rectum: Secondary | ICD-10-CM | POA: Insufficient documentation

## 2024-04-29 DIAGNOSIS — M4628 Osteomyelitis of vertebra, sacral and sacrococcygeal region: Secondary | ICD-10-CM | POA: Insufficient documentation

## 2024-04-29 DIAGNOSIS — Z933 Colostomy status: Secondary | ICD-10-CM | POA: Insufficient documentation

## 2024-04-29 DIAGNOSIS — E11622 Type 2 diabetes mellitus with other skin ulcer: Secondary | ICD-10-CM | POA: Insufficient documentation

## 2024-04-29 DIAGNOSIS — S31000A Unspecified open wound of lower back and pelvis without penetration into retroperitoneum, initial encounter: Secondary | ICD-10-CM | POA: Diagnosis not present

## 2024-04-29 DIAGNOSIS — X58XXXA Exposure to other specified factors, initial encounter: Secondary | ICD-10-CM | POA: Diagnosis not present

## 2024-04-29 DIAGNOSIS — L598 Other specified disorders of the skin and subcutaneous tissue related to radiation: Secondary | ICD-10-CM | POA: Diagnosis not present

## 2024-04-29 LAB — GLUCOSE, CAPILLARY
Glucose-Capillary: 183 mg/dL — ABNORMAL HIGH (ref 70–99)
Glucose-Capillary: 178 mg/dL — ABNORMAL HIGH (ref 70–99)

## 2024-05-01 DIAGNOSIS — C20 Malignant neoplasm of rectum: Secondary | ICD-10-CM | POA: Diagnosis not present

## 2024-05-02 ENCOUNTER — Encounter (HOSPITAL_BASED_OUTPATIENT_CLINIC_OR_DEPARTMENT_OTHER): Admitting: Internal Medicine

## 2024-05-02 DIAGNOSIS — X58XXXA Exposure to other specified factors, initial encounter: Secondary | ICD-10-CM | POA: Diagnosis not present

## 2024-05-02 DIAGNOSIS — S31000A Unspecified open wound of lower back and pelvis without penetration into retroperitoneum, initial encounter: Secondary | ICD-10-CM

## 2024-05-02 DIAGNOSIS — M4628 Osteomyelitis of vertebra, sacral and sacrococcygeal region: Secondary | ICD-10-CM | POA: Diagnosis not present

## 2024-05-02 DIAGNOSIS — L598 Other specified disorders of the skin and subcutaneous tissue related to radiation: Secondary | ICD-10-CM

## 2024-05-02 DIAGNOSIS — C20 Malignant neoplasm of rectum: Secondary | ICD-10-CM

## 2024-05-02 DIAGNOSIS — Z933 Colostomy status: Secondary | ICD-10-CM | POA: Diagnosis not present

## 2024-05-02 DIAGNOSIS — E11622 Type 2 diabetes mellitus with other skin ulcer: Secondary | ICD-10-CM | POA: Diagnosis not present

## 2024-05-02 LAB — GLUCOSE, CAPILLARY
Glucose-Capillary: 179 mg/dL — ABNORMAL HIGH (ref 70–99)
Glucose-Capillary: 176 mg/dL — ABNORMAL HIGH (ref 70–99)

## 2024-05-03 ENCOUNTER — Encounter (HOSPITAL_BASED_OUTPATIENT_CLINIC_OR_DEPARTMENT_OTHER): Admitting: Internal Medicine

## 2024-05-03 DIAGNOSIS — M4628 Osteomyelitis of vertebra, sacral and sacrococcygeal region: Secondary | ICD-10-CM | POA: Diagnosis not present

## 2024-05-03 DIAGNOSIS — E11622 Type 2 diabetes mellitus with other skin ulcer: Secondary | ICD-10-CM | POA: Diagnosis not present

## 2024-05-03 DIAGNOSIS — L598 Other specified disorders of the skin and subcutaneous tissue related to radiation: Secondary | ICD-10-CM

## 2024-05-03 DIAGNOSIS — Z933 Colostomy status: Secondary | ICD-10-CM | POA: Diagnosis not present

## 2024-05-03 DIAGNOSIS — X58XXXA Exposure to other specified factors, initial encounter: Secondary | ICD-10-CM | POA: Diagnosis not present

## 2024-05-03 DIAGNOSIS — C20 Malignant neoplasm of rectum: Secondary | ICD-10-CM

## 2024-05-03 DIAGNOSIS — S31000A Unspecified open wound of lower back and pelvis without penetration into retroperitoneum, initial encounter: Secondary | ICD-10-CM | POA: Diagnosis not present

## 2024-05-03 LAB — GLUCOSE, CAPILLARY
Glucose-Capillary: 180 mg/dL — ABNORMAL HIGH (ref 70–99)
Glucose-Capillary: 183 mg/dL — ABNORMAL HIGH (ref 70–99)

## 2024-05-04 ENCOUNTER — Encounter (HOSPITAL_BASED_OUTPATIENT_CLINIC_OR_DEPARTMENT_OTHER): Admitting: Internal Medicine

## 2024-05-04 DIAGNOSIS — C20 Malignant neoplasm of rectum: Secondary | ICD-10-CM | POA: Diagnosis not present

## 2024-05-05 ENCOUNTER — Encounter (HOSPITAL_BASED_OUTPATIENT_CLINIC_OR_DEPARTMENT_OTHER): Admitting: Internal Medicine

## 2024-05-05 DIAGNOSIS — S31000A Unspecified open wound of lower back and pelvis without penetration into retroperitoneum, initial encounter: Secondary | ICD-10-CM | POA: Diagnosis not present

## 2024-05-05 DIAGNOSIS — E11622 Type 2 diabetes mellitus with other skin ulcer: Secondary | ICD-10-CM | POA: Diagnosis not present

## 2024-05-05 DIAGNOSIS — M4628 Osteomyelitis of vertebra, sacral and sacrococcygeal region: Secondary | ICD-10-CM | POA: Diagnosis not present

## 2024-05-05 DIAGNOSIS — L598 Other specified disorders of the skin and subcutaneous tissue related to radiation: Secondary | ICD-10-CM | POA: Diagnosis not present

## 2024-05-05 DIAGNOSIS — C20 Malignant neoplasm of rectum: Secondary | ICD-10-CM

## 2024-05-05 DIAGNOSIS — X58XXXA Exposure to other specified factors, initial encounter: Secondary | ICD-10-CM | POA: Diagnosis not present

## 2024-05-05 DIAGNOSIS — Z933 Colostomy status: Secondary | ICD-10-CM | POA: Diagnosis not present

## 2024-05-05 LAB — GLUCOSE, CAPILLARY: Glucose-Capillary: 165 mg/dL — ABNORMAL HIGH (ref 70–99)

## 2024-05-06 ENCOUNTER — Encounter (HOSPITAL_BASED_OUTPATIENT_CLINIC_OR_DEPARTMENT_OTHER): Admitting: Internal Medicine

## 2024-05-06 DIAGNOSIS — C20 Malignant neoplasm of rectum: Secondary | ICD-10-CM | POA: Diagnosis not present

## 2024-05-07 DIAGNOSIS — C20 Malignant neoplasm of rectum: Secondary | ICD-10-CM | POA: Diagnosis not present

## 2024-05-07 LAB — GLUCOSE, CAPILLARY: Glucose-Capillary: 160 mg/dL — ABNORMAL HIGH (ref 70–99)

## 2024-05-08 DIAGNOSIS — C20 Malignant neoplasm of rectum: Secondary | ICD-10-CM | POA: Diagnosis not present

## 2024-05-09 ENCOUNTER — Other Ambulatory Visit: Payer: Self-pay | Admitting: Nurse Practitioner

## 2024-05-09 ENCOUNTER — Encounter (HOSPITAL_BASED_OUTPATIENT_CLINIC_OR_DEPARTMENT_OTHER): Admitting: Internal Medicine

## 2024-05-09 ENCOUNTER — Other Ambulatory Visit: Payer: Self-pay | Admitting: Hematology and Oncology

## 2024-05-09 DIAGNOSIS — S31000A Unspecified open wound of lower back and pelvis without penetration into retroperitoneum, initial encounter: Secondary | ICD-10-CM | POA: Diagnosis not present

## 2024-05-09 DIAGNOSIS — M4628 Osteomyelitis of vertebra, sacral and sacrococcygeal region: Secondary | ICD-10-CM

## 2024-05-09 DIAGNOSIS — Z933 Colostomy status: Secondary | ICD-10-CM | POA: Diagnosis not present

## 2024-05-09 DIAGNOSIS — L598 Other specified disorders of the skin and subcutaneous tissue related to radiation: Secondary | ICD-10-CM | POA: Diagnosis not present

## 2024-05-09 DIAGNOSIS — X58XXXA Exposure to other specified factors, initial encounter: Secondary | ICD-10-CM | POA: Diagnosis not present

## 2024-05-09 DIAGNOSIS — C20 Malignant neoplasm of rectum: Secondary | ICD-10-CM

## 2024-05-09 DIAGNOSIS — E11622 Type 2 diabetes mellitus with other skin ulcer: Secondary | ICD-10-CM | POA: Diagnosis not present

## 2024-05-09 LAB — GLUCOSE, CAPILLARY
Glucose-Capillary: 202 mg/dL — ABNORMAL HIGH (ref 70–99)
Glucose-Capillary: 216 mg/dL — ABNORMAL HIGH (ref 70–99)

## 2024-05-10 ENCOUNTER — Encounter (HOSPITAL_BASED_OUTPATIENT_CLINIC_OR_DEPARTMENT_OTHER): Admitting: Internal Medicine

## 2024-05-10 ENCOUNTER — Telehealth: Payer: Self-pay

## 2024-05-10 ENCOUNTER — Other Ambulatory Visit: Payer: Self-pay | Admitting: Nurse Practitioner

## 2024-05-10 DIAGNOSIS — G893 Neoplasm related pain (acute) (chronic): Secondary | ICD-10-CM

## 2024-05-10 DIAGNOSIS — C20 Malignant neoplasm of rectum: Secondary | ICD-10-CM | POA: Diagnosis not present

## 2024-05-10 DIAGNOSIS — Z515 Encounter for palliative care: Secondary | ICD-10-CM

## 2024-05-10 NOTE — Telephone Encounter (Signed)
 RN called pt pharmacy and lexapro  increased to 20mg . Pt messaged on MyChart (see MyChart encounter) with updated instructions. No further needs at this time.

## 2024-05-11 ENCOUNTER — Encounter (HOSPITAL_BASED_OUTPATIENT_CLINIC_OR_DEPARTMENT_OTHER): Admitting: Internal Medicine

## 2024-05-11 DIAGNOSIS — C20 Malignant neoplasm of rectum: Secondary | ICD-10-CM | POA: Diagnosis not present

## 2024-05-12 DIAGNOSIS — C20 Malignant neoplasm of rectum: Secondary | ICD-10-CM | POA: Diagnosis not present

## 2024-05-13 ENCOUNTER — Encounter (HOSPITAL_BASED_OUTPATIENT_CLINIC_OR_DEPARTMENT_OTHER): Admitting: Internal Medicine

## 2024-05-13 DIAGNOSIS — C20 Malignant neoplasm of rectum: Secondary | ICD-10-CM | POA: Diagnosis not present

## 2024-05-13 DIAGNOSIS — X58XXXA Exposure to other specified factors, initial encounter: Secondary | ICD-10-CM | POA: Diagnosis not present

## 2024-05-13 DIAGNOSIS — S31000A Unspecified open wound of lower back and pelvis without penetration into retroperitoneum, initial encounter: Secondary | ICD-10-CM | POA: Diagnosis not present

## 2024-05-13 DIAGNOSIS — M4628 Osteomyelitis of vertebra, sacral and sacrococcygeal region: Secondary | ICD-10-CM | POA: Diagnosis not present

## 2024-05-13 DIAGNOSIS — L598 Other specified disorders of the skin and subcutaneous tissue related to radiation: Secondary | ICD-10-CM

## 2024-05-13 DIAGNOSIS — E11622 Type 2 diabetes mellitus with other skin ulcer: Secondary | ICD-10-CM | POA: Diagnosis not present

## 2024-05-13 DIAGNOSIS — Z933 Colostomy status: Secondary | ICD-10-CM | POA: Diagnosis not present

## 2024-05-13 LAB — GLUCOSE, CAPILLARY
Glucose-Capillary: 262 mg/dL — ABNORMAL HIGH (ref 70–99)
Glucose-Capillary: 209 mg/dL — ABNORMAL HIGH (ref 70–99)

## 2024-05-14 DIAGNOSIS — C20 Malignant neoplasm of rectum: Secondary | ICD-10-CM | POA: Diagnosis not present

## 2024-05-15 DIAGNOSIS — C20 Malignant neoplasm of rectum: Secondary | ICD-10-CM | POA: Diagnosis not present

## 2024-05-16 ENCOUNTER — Encounter (HOSPITAL_BASED_OUTPATIENT_CLINIC_OR_DEPARTMENT_OTHER): Admitting: Internal Medicine

## 2024-05-16 DIAGNOSIS — L598 Other specified disorders of the skin and subcutaneous tissue related to radiation: Secondary | ICD-10-CM | POA: Diagnosis not present

## 2024-05-16 DIAGNOSIS — X58XXXA Exposure to other specified factors, initial encounter: Secondary | ICD-10-CM | POA: Diagnosis not present

## 2024-05-16 DIAGNOSIS — S31000A Unspecified open wound of lower back and pelvis without penetration into retroperitoneum, initial encounter: Secondary | ICD-10-CM | POA: Diagnosis not present

## 2024-05-16 DIAGNOSIS — C20 Malignant neoplasm of rectum: Secondary | ICD-10-CM

## 2024-05-16 DIAGNOSIS — M4628 Osteomyelitis of vertebra, sacral and sacrococcygeal region: Secondary | ICD-10-CM

## 2024-05-16 DIAGNOSIS — E11622 Type 2 diabetes mellitus with other skin ulcer: Secondary | ICD-10-CM | POA: Diagnosis not present

## 2024-05-16 DIAGNOSIS — Z933 Colostomy status: Secondary | ICD-10-CM | POA: Diagnosis not present

## 2024-05-16 LAB — GLUCOSE, CAPILLARY
Glucose-Capillary: 203 mg/dL — ABNORMAL HIGH (ref 70–99)
Glucose-Capillary: 218 mg/dL — ABNORMAL HIGH (ref 70–99)

## 2024-05-17 DIAGNOSIS — C20 Malignant neoplasm of rectum: Secondary | ICD-10-CM | POA: Diagnosis not present

## 2024-05-17 MED ORDER — ESCITALOPRAM OXALATE 20 MG PO TABS: 20.0000 mg | ORAL_TABLET | Freq: Every day | ORAL | 1 refills | Status: AC

## 2024-05-18 ENCOUNTER — Encounter (HOSPITAL_BASED_OUTPATIENT_CLINIC_OR_DEPARTMENT_OTHER): Admitting: Internal Medicine

## 2024-05-18 DIAGNOSIS — C20 Malignant neoplasm of rectum: Secondary | ICD-10-CM

## 2024-05-18 DIAGNOSIS — S31000A Unspecified open wound of lower back and pelvis without penetration into retroperitoneum, initial encounter: Secondary | ICD-10-CM | POA: Diagnosis not present

## 2024-05-18 DIAGNOSIS — Z933 Colostomy status: Secondary | ICD-10-CM | POA: Diagnosis not present

## 2024-05-18 DIAGNOSIS — E11622 Type 2 diabetes mellitus with other skin ulcer: Secondary | ICD-10-CM | POA: Diagnosis not present

## 2024-05-18 DIAGNOSIS — M4628 Osteomyelitis of vertebra, sacral and sacrococcygeal region: Secondary | ICD-10-CM | POA: Diagnosis not present

## 2024-05-18 DIAGNOSIS — L598 Other specified disorders of the skin and subcutaneous tissue related to radiation: Secondary | ICD-10-CM

## 2024-05-18 DIAGNOSIS — X58XXXA Exposure to other specified factors, initial encounter: Secondary | ICD-10-CM | POA: Diagnosis not present

## 2024-05-18 LAB — GLUCOSE, CAPILLARY
Glucose-Capillary: 188 mg/dL — ABNORMAL HIGH (ref 70–99)
Glucose-Capillary: 189 mg/dL — ABNORMAL HIGH (ref 70–99)

## 2024-05-19 ENCOUNTER — Other Ambulatory Visit (HOSPITAL_COMMUNITY): Payer: Self-pay

## 2024-05-19 ENCOUNTER — Encounter (HOSPITAL_BASED_OUTPATIENT_CLINIC_OR_DEPARTMENT_OTHER): Admitting: Internal Medicine

## 2024-05-19 ENCOUNTER — Other Ambulatory Visit: Payer: Self-pay

## 2024-05-19 DIAGNOSIS — G893 Neoplasm related pain (acute) (chronic): Secondary | ICD-10-CM

## 2024-05-19 DIAGNOSIS — C20 Malignant neoplasm of rectum: Secondary | ICD-10-CM | POA: Diagnosis not present

## 2024-05-19 DIAGNOSIS — Z515 Encounter for palliative care: Secondary | ICD-10-CM

## 2024-05-19 MED ORDER — MORPHINE SULFATE ER 30 MG PO TBCR
60.0000 mg | EXTENDED_RELEASE_TABLET | Freq: Two times a day (BID) | ORAL | 0 refills | Status: AC
  Filled 2024-05-19: qty 120, 30d supply, fill #0

## 2024-05-20 DIAGNOSIS — C20 Malignant neoplasm of rectum: Secondary | ICD-10-CM | POA: Diagnosis not present

## 2024-05-21 DIAGNOSIS — C20 Malignant neoplasm of rectum: Secondary | ICD-10-CM | POA: Diagnosis not present

## 2024-05-22 DIAGNOSIS — C20 Malignant neoplasm of rectum: Secondary | ICD-10-CM | POA: Diagnosis not present

## 2024-05-23 ENCOUNTER — Encounter (HOSPITAL_BASED_OUTPATIENT_CLINIC_OR_DEPARTMENT_OTHER): Admitting: Internal Medicine

## 2024-05-23 DIAGNOSIS — C20 Malignant neoplasm of rectum: Secondary | ICD-10-CM | POA: Diagnosis not present

## 2024-05-23 DIAGNOSIS — E11622 Type 2 diabetes mellitus with other skin ulcer: Secondary | ICD-10-CM | POA: Diagnosis not present

## 2024-05-23 DIAGNOSIS — X58XXXA Exposure to other specified factors, initial encounter: Secondary | ICD-10-CM | POA: Diagnosis not present

## 2024-05-23 DIAGNOSIS — M4628 Osteomyelitis of vertebra, sacral and sacrococcygeal region: Secondary | ICD-10-CM | POA: Diagnosis not present

## 2024-05-23 DIAGNOSIS — L598 Other specified disorders of the skin and subcutaneous tissue related to radiation: Secondary | ICD-10-CM

## 2024-05-23 DIAGNOSIS — S31000A Unspecified open wound of lower back and pelvis without penetration into retroperitoneum, initial encounter: Secondary | ICD-10-CM | POA: Diagnosis not present

## 2024-05-23 DIAGNOSIS — Z933 Colostomy status: Secondary | ICD-10-CM | POA: Diagnosis not present

## 2024-05-23 LAB — GLUCOSE, CAPILLARY: Glucose-Capillary: 209 mg/dL — ABNORMAL HIGH (ref 70–99)

## 2024-05-24 ENCOUNTER — Encounter: Payer: Self-pay | Admitting: Nurse Practitioner

## 2024-05-24 ENCOUNTER — Encounter (HOSPITAL_BASED_OUTPATIENT_CLINIC_OR_DEPARTMENT_OTHER): Admitting: Internal Medicine

## 2024-05-24 ENCOUNTER — Inpatient Hospital Stay: Attending: Nurse Practitioner | Admitting: Nurse Practitioner

## 2024-05-24 DIAGNOSIS — C20 Malignant neoplasm of rectum: Secondary | ICD-10-CM | POA: Diagnosis not present

## 2024-05-24 DIAGNOSIS — Z515 Encounter for palliative care: Secondary | ICD-10-CM | POA: Diagnosis not present

## 2024-05-24 DIAGNOSIS — G893 Neoplasm related pain (acute) (chronic): Secondary | ICD-10-CM | POA: Diagnosis not present

## 2024-05-24 DIAGNOSIS — K5903 Drug induced constipation: Secondary | ICD-10-CM | POA: Diagnosis not present

## 2024-05-24 LAB — GLUCOSE, CAPILLARY: Glucose-Capillary: 167 mg/dL — ABNORMAL HIGH (ref 70–99)

## 2024-05-25 ENCOUNTER — Encounter (HOSPITAL_BASED_OUTPATIENT_CLINIC_OR_DEPARTMENT_OTHER): Admitting: General Surgery

## 2024-05-25 DIAGNOSIS — X58XXXA Exposure to other specified factors, initial encounter: Secondary | ICD-10-CM | POA: Diagnosis not present

## 2024-05-25 DIAGNOSIS — Z933 Colostomy status: Secondary | ICD-10-CM | POA: Diagnosis not present

## 2024-05-25 DIAGNOSIS — E11622 Type 2 diabetes mellitus with other skin ulcer: Secondary | ICD-10-CM | POA: Diagnosis not present

## 2024-05-25 DIAGNOSIS — L598 Other specified disorders of the skin and subcutaneous tissue related to radiation: Secondary | ICD-10-CM | POA: Diagnosis not present

## 2024-05-25 DIAGNOSIS — S31000A Unspecified open wound of lower back and pelvis without penetration into retroperitoneum, initial encounter: Secondary | ICD-10-CM | POA: Diagnosis not present

## 2024-05-25 DIAGNOSIS — M4628 Osteomyelitis of vertebra, sacral and sacrococcygeal region: Secondary | ICD-10-CM | POA: Diagnosis not present

## 2024-05-25 DIAGNOSIS — C20 Malignant neoplasm of rectum: Secondary | ICD-10-CM | POA: Diagnosis not present

## 2024-05-25 LAB — GLUCOSE, CAPILLARY
Glucose-Capillary: 197 mg/dL — ABNORMAL HIGH (ref 70–99)
Glucose-Capillary: 176 mg/dL — ABNORMAL HIGH (ref 70–99)

## 2024-05-26 ENCOUNTER — Other Ambulatory Visit (HOSPITAL_COMMUNITY): Payer: Self-pay

## 2024-05-26 ENCOUNTER — Other Ambulatory Visit: Payer: Self-pay

## 2024-05-26 ENCOUNTER — Encounter (HOSPITAL_BASED_OUTPATIENT_CLINIC_OR_DEPARTMENT_OTHER): Admitting: Internal Medicine

## 2024-05-26 DIAGNOSIS — G893 Neoplasm related pain (acute) (chronic): Secondary | ICD-10-CM

## 2024-05-26 DIAGNOSIS — Z933 Colostomy status: Secondary | ICD-10-CM | POA: Diagnosis not present

## 2024-05-26 DIAGNOSIS — S31000A Unspecified open wound of lower back and pelvis without penetration into retroperitoneum, initial encounter: Secondary | ICD-10-CM

## 2024-05-26 DIAGNOSIS — C20 Malignant neoplasm of rectum: Secondary | ICD-10-CM | POA: Diagnosis not present

## 2024-05-26 DIAGNOSIS — E11622 Type 2 diabetes mellitus with other skin ulcer: Secondary | ICD-10-CM | POA: Diagnosis not present

## 2024-05-26 DIAGNOSIS — M4628 Osteomyelitis of vertebra, sacral and sacrococcygeal region: Secondary | ICD-10-CM | POA: Diagnosis not present

## 2024-05-26 DIAGNOSIS — X58XXXA Exposure to other specified factors, initial encounter: Secondary | ICD-10-CM | POA: Diagnosis not present

## 2024-05-26 DIAGNOSIS — L598 Other specified disorders of the skin and subcutaneous tissue related to radiation: Secondary | ICD-10-CM

## 2024-05-26 DIAGNOSIS — Z515 Encounter for palliative care: Secondary | ICD-10-CM

## 2024-05-26 LAB — GLUCOSE, CAPILLARY
Glucose-Capillary: 207 mg/dL — ABNORMAL HIGH (ref 70–99)
Glucose-Capillary: 180 mg/dL — ABNORMAL HIGH (ref 70–99)

## 2024-05-26 MED ORDER — MORPHINE SULFATE 15 MG PO TABS: 7.5000 mg | ORAL_TABLET | ORAL | 0 refills | Status: AC | PRN

## 2024-05-27 ENCOUNTER — Encounter (HOSPITAL_BASED_OUTPATIENT_CLINIC_OR_DEPARTMENT_OTHER): Admitting: Internal Medicine

## 2024-05-27 DIAGNOSIS — C20 Malignant neoplasm of rectum: Secondary | ICD-10-CM | POA: Diagnosis not present

## 2024-05-28 DIAGNOSIS — C20 Malignant neoplasm of rectum: Secondary | ICD-10-CM | POA: Diagnosis not present

## 2024-05-29 DIAGNOSIS — C20 Malignant neoplasm of rectum: Secondary | ICD-10-CM | POA: Diagnosis not present

## 2024-05-30 DIAGNOSIS — C20 Malignant neoplasm of rectum: Secondary | ICD-10-CM | POA: Diagnosis not present

## 2024-05-31 ENCOUNTER — Encounter (HOSPITAL_BASED_OUTPATIENT_CLINIC_OR_DEPARTMENT_OTHER): Attending: Internal Medicine | Admitting: Internal Medicine

## 2024-05-31 DIAGNOSIS — M4628 Osteomyelitis of vertebra, sacral and sacrococcygeal region: Secondary | ICD-10-CM | POA: Insufficient documentation

## 2024-05-31 DIAGNOSIS — X58XXXA Exposure to other specified factors, initial encounter: Secondary | ICD-10-CM | POA: Insufficient documentation

## 2024-05-31 DIAGNOSIS — Z933 Colostomy status: Secondary | ICD-10-CM | POA: Insufficient documentation

## 2024-05-31 DIAGNOSIS — E11622 Type 2 diabetes mellitus with other skin ulcer: Secondary | ICD-10-CM | POA: Diagnosis not present

## 2024-05-31 DIAGNOSIS — C20 Malignant neoplasm of rectum: Secondary | ICD-10-CM | POA: Diagnosis not present

## 2024-05-31 DIAGNOSIS — S31000A Unspecified open wound of lower back and pelvis without penetration into retroperitoneum, initial encounter: Secondary | ICD-10-CM | POA: Diagnosis not present

## 2024-05-31 DIAGNOSIS — L598 Other specified disorders of the skin and subcutaneous tissue related to radiation: Secondary | ICD-10-CM | POA: Insufficient documentation

## 2024-05-31 LAB — GLUCOSE, CAPILLARY
Glucose-Capillary: 233 mg/dL — ABNORMAL HIGH (ref 70–99)
Glucose-Capillary: 235 mg/dL — ABNORMAL HIGH (ref 70–99)

## 2024-06-01 ENCOUNTER — Encounter (HOSPITAL_BASED_OUTPATIENT_CLINIC_OR_DEPARTMENT_OTHER): Admitting: Internal Medicine

## 2024-06-01 DIAGNOSIS — C20 Malignant neoplasm of rectum: Secondary | ICD-10-CM | POA: Diagnosis not present

## 2024-06-01 NOTE — Progress Notes (Signed)
 Palliative Medicine [facility]  Telephone:(336) [phone] [phone]) [phone]   Name: Steven [name] Date: 06/01/2024 MRN: [id number]  DOB: [birth year]  Patient Care Team: Arloa Elsie SAUNDERS, MD as PCP - General (Family Medicine) Loretha Ash, MD as Consulting Physician (Hematology and Oncology) Debby Hila, MD as Consulting Physician (General Surgery) Causey, Morna Pickle, NP as Nurse Practitioner (Hematology and Oncology) Dewey Rush, MD as Consulting Physician (Radiation Oncology) Mansouraty, Aloha Raddle., MD as Consulting Physician (Gastroenterology) Elisabeth Valli BIRCH, MD as Consulting Physician (Urology) Pickenpack-Cousar, Fannie SAILOR, NP as Nurse Practitioner [facility] and Palliative Medicine)   I connected with Steven [name] on 05/24/24 at  4:00 PM EDT by telephone and verified that I am speaking with the correct person using two identifiers.   I discussed the limitations, risks, security and privacy concerns of performing an evaluation and management service by telemedicine and the availability of in-person appointments. I also discussed with the patient that there may be a patient responsible charge related to this service. The patient expressed understanding and agreed to proceed.   Other persons participating in the visit and their role in the encounter: N/A   Patient's location: Home   Provider's location: [facility]   INTERVAL HISTORY: Steven [name] is a 53 y.o. male with oncologic medical history including stage II rectal adenocarcinoma s/p chemoradiation (2022), resection with ostomy, chronic pelvic abscess.  Palliative is seeing patient for symptom management and goals of care.   SOCIAL HISTORY:     reports that he has never smoked. He has never used smokeless tobacco. He reports current alcohol use. He reports that he does not use drugs.  ADVANCE DIRECTIVES:  None on file   CODE STATUS: Full code  PAST MEDICAL HISTORY: Past Medical History:   Diagnosis Date   Anemia 02/2021   iron    Anxiety    Asthma    Bilateral pulmonary embolism (HCC) 04/24/2019   Dehiscence of anastomosis of large intestine 12/31/2022   Depression    Diabetes mellitus without complication (HCC)    History of kidney stones    HLD (hyperlipidemia)    Hypertension    Kidney stones    Neuromuscular disorder (HCC)    from radiation bi lat   OSA (obstructive sleep apnea)    CPAP   Peripheral vascular disease (HCC)    DVT   rectal ca 02/2021   Sleep apnea     ALLERGIES:  is allergic to contrast media [iodinated contrast media].  MEDICATIONS:  Current Outpatient Medications  Medication Sig Dispense Refill   albuterol  (PROAIR  HFA) 108 (90 Base) MCG/ACT inhaler Inhale 2 puffs into the lungs every 4 (four) hours as needed for wheezing or shortness of breath.     ALPRAZolam  (XANAX ) 0.5 MG tablet Take 0.5 mg by mouth 2 (two) times daily as needed for anxiety or sleep.     calcium  carbonate (TUMS - DOSED IN MG ELEMENTAL CALCIUM ) 500 MG chewable tablet Chew 2 tablets by mouth daily as needed for indigestion or heartburn.     cyclobenzaprine  (FLEXERIL ) 10 MG tablet Take 1 tablet (10 mg total) by mouth 3 (three) times daily as needed for muscle spasms. 270 tablet 3   diphenhydrAMINE  (BENADRYL ) 25 mg capsule Take 1 capsule (25 mg total) by mouth every 6 (six) hours as needed for itching. (Patient taking differently: Take 25 mg by mouth See admin instructions. Take 25-50 mg by mouth as directed prior to receiving contrast dye as a part of the Benadryl  + Prednisone   pre-treatment regimen)     escitalopram  (LEXAPRO ) 20 MG tablet Take 1 tablet (20 mg total) by mouth at bedtime. 90 tablet 1   ferrous sulfate  325 (65 FE) MG tablet Take 1 tablet (325 mg total) by mouth 2 (two) times daily with a meal. (Patient not taking: Reported on 03/03/2024)     Gauze Pads & Dressings (COMBINE ABD) 5X9 PADS 1 Units by Does not apply route daily. 30 each 3   Gauze Pads & Dressings  (KERLIX GAUZE ROLL LARGE) MISC 1 Units by Does not apply route daily. 30 each 3   morphine  (MS CONTIN ) 30 MG 12 hr tablet Take 2 tablets (60 mg total) by mouth every 12 (twelve) hours. 120 tablet 0   morphine  (MSIR) 15 MG tablet Take 0.5-1 tablets (7.5-15 mg total) by mouth every 4 (four) hours as needed for moderate pain (pain score 4-6) or severe pain (pain score 7-10). 90 tablet 0   PRESCRIPTION MEDICATION See admin instructions. TPN from [facility] (scanned into the Media tab in Epic by Registration as a document on 03/03/2024) Beginning at 8-9 PM for twelve hours     Simethicone  (GAS-X PO) Take 1-2 tablets by mouth as needed (gas).     tetrahydrozoline-zinc  (VISINE-AC) 0.05-0.25 % ophthalmic solution Place 2 drops into both eyes 3 (three) times daily as needed (for allergies).     TYLENOL  500 MG tablet Take 500-1,000 mg by mouth See admin instructions. Take 1,000 mg by mouth at 6 AM, 10 AM, and 6 PM AND 500 mg at 2 PM     XARELTO  20 MG TABS tablet TAKE 1 TABLET(20 MG) BY MOUTH DAILY WITH SUPPER 90 tablet 3   No current facility-administered medications for this visit.    VITAL SIGNS: There were no vitals taken for this visit. There were no vitals filed for this visit.  Estimated body mass index is 28.47 kg/m as calculated from the following:   Height as of 03/04/24: 5' 10 (1.778 m).   Weight as of 03/04/24: 198 lb 6.6 oz (90 kg).   PERFORMANCE STATUS (ECOG) : 1 - Symptomatic but completely ambulatory  IMPRESSION: Discussed the use of AI scribe software for clinical note transcription with the patient, who gave verbal consent to proceed.  History of Present Illness I connected by phone with Steven [name] for symptom management follow-up. He is doing well overall. No acute distress.   Patient reports his pain is well-controlled on current regimen.  He is currently taking MS Contin  every 12 hours in addition to MS IR every 4 hours as needed for breakthrough pain. Flexeril  as needed for  muscle spasms.  No adjustments to current regimen at this time. Request 90 day supply for all available medications due to insurance request. He sometimes forgets to take his breakthrough pain medication. He is currently managing well with his medications and does not require any refills at the moment.  MS IR currently on back order. Will consider options however if unavailable may have to consider change in regimen. Will discuss further with patient at later time once confirmed availability with pharmacy.   He describes a fluctuating pattern in his bowel movements since receiving his ostomy. He experiences alternating days of firm and soft stool consistency without any identifiable dietary triggers. On some days, the stool is firm and 'caked up inside the bag,' necessitating a bag change, while on other days, it is liquid and fills the bag quickly. He has not identified any specific cause for these  variations and manages them as he occurs. No constipation or diarrhea beyond the described fluctuations.  We will continue to closely monitor and support.  Goals of Care 02/11/24: We discussed his current illness and what it means in the larger context of ihs on-going co-morbidities. Natural disease trajectory and expectations were discussed. Mr. Schreiner and his wife are realistic in their understanding of current conditions and plan of care. He is remaining hopeful for stability and improvement in the near future. He is emotional expressing his decreased quality of life and health challenges. Continues to take things one day at a time as his goals are clear to continue to treat the treatable aggressively allowing him every opportunity to thrive while managing symptoms.   I discussed the importance of continued conversation with family and their medical providers regarding overall plan of care and treatment options, ensuring decisions are within the context of the patients values and GOCs. Assessment &  Plan Cancer Related pain management Pain from post-surgical wound managed with morphine . Pain controlled but not fully alleviated. Side effects include somnolence and diaphoresis.  - Continue morphine  extended release 60 mg every 12 hours. - Continue morphine  immediate release 15 mg every 4 hours as needed. - Refill prescriptions for morphine  and other medications. -Flexeril  10mg  every 8 hours as needed.    Depression Depression managed with Lexapro . No current issues reported. - Continue Lexapro  20 mg at bedtime. - Refill Lexapro  prescription.  Follow-up Follow-up planned to reassess management and address medication needs. - Schedule follow-up in 4-6 weeks. Sooner if needed.  - Contact provider if refills needed before appointment.  Colostomy status with variable output He experiences variable colostomy output, alternating between firm and liquid consistency without a clear pattern or dietary cause. This has been consistent since the ostomy placement but is currently manageable.  Patient expressed understanding and was in agreement with this plan. He also understands that He can call the clinic at any time with any questions, concerns, or complaints.   Any controlled substances utilized were prescribed in the context of palliative care. PDMP has been reviewed.   I provided 25 minutes of non face-to-face telephone visit time during this encounter, and > 50% was spent counseling as documented under my assessment & plan. Visit consisted of counseling and education dealing with the complex and emotionally intense issues of symptom management and palliative care in the setting of serious and potentially life-threatening illness.  Levon Borer, AGPCNP-BC  Palliative Medicine Team/[facility]

## 2024-06-02 ENCOUNTER — Encounter (HOSPITAL_BASED_OUTPATIENT_CLINIC_OR_DEPARTMENT_OTHER): Admitting: Internal Medicine

## 2024-06-02 DIAGNOSIS — M4628 Osteomyelitis of vertebra, sacral and sacrococcygeal region: Secondary | ICD-10-CM

## 2024-06-02 DIAGNOSIS — C20 Malignant neoplasm of rectum: Secondary | ICD-10-CM

## 2024-06-02 DIAGNOSIS — L598 Other specified disorders of the skin and subcutaneous tissue related to radiation: Secondary | ICD-10-CM | POA: Diagnosis not present

## 2024-06-02 DIAGNOSIS — S31000A Unspecified open wound of lower back and pelvis without penetration into retroperitoneum, initial encounter: Secondary | ICD-10-CM

## 2024-06-02 LAB — GLUCOSE, CAPILLARY
Glucose-Capillary: 188 mg/dL — ABNORMAL HIGH (ref 70–99)
Glucose-Capillary: 185 mg/dL — ABNORMAL HIGH (ref 70–99)

## 2024-06-03 ENCOUNTER — Encounter (HOSPITAL_BASED_OUTPATIENT_CLINIC_OR_DEPARTMENT_OTHER): Admitting: Internal Medicine

## 2024-06-03 ENCOUNTER — Ambulatory Visit (HOSPITAL_BASED_OUTPATIENT_CLINIC_OR_DEPARTMENT_OTHER): Admitting: Internal Medicine

## 2024-06-03 DIAGNOSIS — S31000A Unspecified open wound of lower back and pelvis without penetration into retroperitoneum, initial encounter: Secondary | ICD-10-CM

## 2024-06-03 DIAGNOSIS — M4628 Osteomyelitis of vertebra, sacral and sacrococcygeal region: Secondary | ICD-10-CM | POA: Diagnosis not present

## 2024-06-03 DIAGNOSIS — C20 Malignant neoplasm of rectum: Secondary | ICD-10-CM

## 2024-06-03 DIAGNOSIS — Z933 Colostomy status: Secondary | ICD-10-CM | POA: Diagnosis not present

## 2024-06-03 DIAGNOSIS — L598 Other specified disorders of the skin and subcutaneous tissue related to radiation: Secondary | ICD-10-CM

## 2024-06-03 DIAGNOSIS — E11622 Type 2 diabetes mellitus with other skin ulcer: Secondary | ICD-10-CM | POA: Diagnosis not present

## 2024-06-03 LAB — GLUCOSE, CAPILLARY
Glucose-Capillary: 196 mg/dL — ABNORMAL HIGH (ref 70–99)
Glucose-Capillary: 205 mg/dL — ABNORMAL HIGH (ref 70–99)

## 2024-06-05 DIAGNOSIS — C20 Malignant neoplasm of rectum: Secondary | ICD-10-CM | POA: Diagnosis not present

## 2024-06-06 ENCOUNTER — Encounter (HOSPITAL_BASED_OUTPATIENT_CLINIC_OR_DEPARTMENT_OTHER): Admitting: Internal Medicine

## 2024-06-06 DIAGNOSIS — K8040 Calculus of bile duct with cholecystitis, unspecified, without obstruction: Secondary | ICD-10-CM | POA: Diagnosis not present

## 2024-06-06 DIAGNOSIS — Z9989 Dependence on other enabling machines and devices: Secondary | ICD-10-CM | POA: Diagnosis not present

## 2024-06-06 DIAGNOSIS — Z7984 Long term (current) use of oral hypoglycemic drugs: Secondary | ICD-10-CM | POA: Diagnosis not present

## 2024-06-06 DIAGNOSIS — E0865 Diabetes mellitus due to underlying condition with hyperglycemia: Secondary | ICD-10-CM | POA: Diagnosis not present

## 2024-06-06 DIAGNOSIS — Z01818 Encounter for other preprocedural examination: Secondary | ICD-10-CM | POA: Diagnosis not present

## 2024-06-06 DIAGNOSIS — J4520 Mild intermittent asthma, uncomplicated: Secondary | ICD-10-CM | POA: Diagnosis not present

## 2024-06-06 DIAGNOSIS — Z7901 Long term (current) use of anticoagulants: Secondary | ICD-10-CM | POA: Diagnosis not present

## 2024-06-06 DIAGNOSIS — G4733 Obstructive sleep apnea (adult) (pediatric): Secondary | ICD-10-CM | POA: Diagnosis not present

## 2024-06-06 DIAGNOSIS — C20 Malignant neoplasm of rectum: Secondary | ICD-10-CM | POA: Diagnosis not present

## 2024-06-06 DIAGNOSIS — I2699 Other pulmonary embolism without acute cor pulmonale: Secondary | ICD-10-CM | POA: Diagnosis not present

## 2024-06-07 ENCOUNTER — Encounter (HOSPITAL_BASED_OUTPATIENT_CLINIC_OR_DEPARTMENT_OTHER): Admitting: Internal Medicine

## 2024-06-07 DIAGNOSIS — L598 Other specified disorders of the skin and subcutaneous tissue related to radiation: Secondary | ICD-10-CM

## 2024-06-07 DIAGNOSIS — C20 Malignant neoplasm of rectum: Secondary | ICD-10-CM | POA: Diagnosis not present

## 2024-06-07 DIAGNOSIS — Z933 Colostomy status: Secondary | ICD-10-CM | POA: Diagnosis not present

## 2024-06-07 DIAGNOSIS — X58XXXA Exposure to other specified factors, initial encounter: Secondary | ICD-10-CM | POA: Diagnosis not present

## 2024-06-07 DIAGNOSIS — S31000A Unspecified open wound of lower back and pelvis without penetration into retroperitoneum, initial encounter: Secondary | ICD-10-CM | POA: Diagnosis not present

## 2024-06-07 DIAGNOSIS — E11622 Type 2 diabetes mellitus with other skin ulcer: Secondary | ICD-10-CM | POA: Diagnosis not present

## 2024-06-07 DIAGNOSIS — M4628 Osteomyelitis of vertebra, sacral and sacrococcygeal region: Secondary | ICD-10-CM | POA: Diagnosis not present

## 2024-06-07 LAB — GLUCOSE, CAPILLARY: Glucose-Capillary: 209 mg/dL — ABNORMAL HIGH (ref 70–99)

## 2024-06-08 ENCOUNTER — Encounter (HOSPITAL_BASED_OUTPATIENT_CLINIC_OR_DEPARTMENT_OTHER): Admitting: Internal Medicine

## 2024-06-08 ENCOUNTER — Telehealth: Payer: Self-pay | Admitting: Nurse Practitioner

## 2024-06-08 DIAGNOSIS — C20 Malignant neoplasm of rectum: Secondary | ICD-10-CM

## 2024-06-08 DIAGNOSIS — S31000A Unspecified open wound of lower back and pelvis without penetration into retroperitoneum, initial encounter: Secondary | ICD-10-CM | POA: Diagnosis not present

## 2024-06-08 DIAGNOSIS — M4628 Osteomyelitis of vertebra, sacral and sacrococcygeal region: Secondary | ICD-10-CM | POA: Diagnosis not present

## 2024-06-08 DIAGNOSIS — E11622 Type 2 diabetes mellitus with other skin ulcer: Secondary | ICD-10-CM | POA: Diagnosis not present

## 2024-06-08 DIAGNOSIS — L598 Other specified disorders of the skin and subcutaneous tissue related to radiation: Secondary | ICD-10-CM

## 2024-06-08 DIAGNOSIS — Z933 Colostomy status: Secondary | ICD-10-CM | POA: Diagnosis not present

## 2024-06-08 LAB — GLUCOSE, CAPILLARY
Glucose-Capillary: 227 mg/dL — ABNORMAL HIGH (ref 70–99)
Glucose-Capillary: 204 mg/dL — ABNORMAL HIGH (ref 70–99)

## 2024-06-08 NOTE — Telephone Encounter (Signed)
 Scheduled patient for palliative care appointment in October. Spoke with the patient and he is aware.

## 2024-06-09 ENCOUNTER — Encounter (HOSPITAL_BASED_OUTPATIENT_CLINIC_OR_DEPARTMENT_OTHER): Admitting: Internal Medicine

## 2024-06-09 ENCOUNTER — Other Ambulatory Visit: Payer: Self-pay

## 2024-06-09 DIAGNOSIS — C20 Malignant neoplasm of rectum: Secondary | ICD-10-CM | POA: Diagnosis not present

## 2024-06-09 DIAGNOSIS — S31000A Unspecified open wound of lower back and pelvis without penetration into retroperitoneum, initial encounter: Secondary | ICD-10-CM | POA: Diagnosis not present

## 2024-06-09 DIAGNOSIS — L598 Other specified disorders of the skin and subcutaneous tissue related to radiation: Secondary | ICD-10-CM

## 2024-06-09 LAB — GLUCOSE, CAPILLARY
Glucose-Capillary: 224 mg/dL — ABNORMAL HIGH (ref 70–99)
Glucose-Capillary: 278 mg/dL — ABNORMAL HIGH (ref 70–99)

## 2024-06-09 NOTE — Telephone Encounter (Signed)
 error

## 2024-06-10 ENCOUNTER — Encounter (HOSPITAL_BASED_OUTPATIENT_CLINIC_OR_DEPARTMENT_OTHER): Admitting: Internal Medicine

## 2024-06-10 DIAGNOSIS — C20 Malignant neoplasm of rectum: Secondary | ICD-10-CM | POA: Diagnosis not present

## 2024-06-11 DIAGNOSIS — C20 Malignant neoplasm of rectum: Secondary | ICD-10-CM | POA: Diagnosis not present

## 2024-06-12 DIAGNOSIS — C20 Malignant neoplasm of rectum: Secondary | ICD-10-CM | POA: Diagnosis not present

## 2024-06-13 ENCOUNTER — Encounter (HOSPITAL_BASED_OUTPATIENT_CLINIC_OR_DEPARTMENT_OTHER): Admitting: Internal Medicine

## 2024-06-13 DIAGNOSIS — L598 Other specified disorders of the skin and subcutaneous tissue related to radiation: Secondary | ICD-10-CM

## 2024-06-13 DIAGNOSIS — C20 Malignant neoplasm of rectum: Secondary | ICD-10-CM

## 2024-06-13 DIAGNOSIS — E11622 Type 2 diabetes mellitus with other skin ulcer: Secondary | ICD-10-CM | POA: Diagnosis not present

## 2024-06-13 DIAGNOSIS — M4628 Osteomyelitis of vertebra, sacral and sacrococcygeal region: Secondary | ICD-10-CM

## 2024-06-13 DIAGNOSIS — S31000A Unspecified open wound of lower back and pelvis without penetration into retroperitoneum, initial encounter: Secondary | ICD-10-CM

## 2024-06-13 DIAGNOSIS — X58XXXA Exposure to other specified factors, initial encounter: Secondary | ICD-10-CM | POA: Diagnosis not present

## 2024-06-13 DIAGNOSIS — Z933 Colostomy status: Secondary | ICD-10-CM | POA: Diagnosis not present

## 2024-06-13 LAB — GLUCOSE, CAPILLARY
Glucose-Capillary: 189 mg/dL — ABNORMAL HIGH (ref 70–99)
Glucose-Capillary: 203 mg/dL — ABNORMAL HIGH (ref 70–99)

## 2024-06-14 ENCOUNTER — Encounter (HOSPITAL_BASED_OUTPATIENT_CLINIC_OR_DEPARTMENT_OTHER): Admitting: Internal Medicine

## 2024-06-14 DIAGNOSIS — C20 Malignant neoplasm of rectum: Secondary | ICD-10-CM | POA: Diagnosis not present

## 2024-06-15 ENCOUNTER — Encounter (HOSPITAL_BASED_OUTPATIENT_CLINIC_OR_DEPARTMENT_OTHER): Admitting: Internal Medicine

## 2024-06-15 DIAGNOSIS — M4628 Osteomyelitis of vertebra, sacral and sacrococcygeal region: Secondary | ICD-10-CM | POA: Diagnosis not present

## 2024-06-15 DIAGNOSIS — L598 Other specified disorders of the skin and subcutaneous tissue related to radiation: Secondary | ICD-10-CM | POA: Diagnosis not present

## 2024-06-15 DIAGNOSIS — X58XXXA Exposure to other specified factors, initial encounter: Secondary | ICD-10-CM | POA: Diagnosis not present

## 2024-06-15 DIAGNOSIS — S31000A Unspecified open wound of lower back and pelvis without penetration into retroperitoneum, initial encounter: Secondary | ICD-10-CM

## 2024-06-15 DIAGNOSIS — C20 Malignant neoplasm of rectum: Secondary | ICD-10-CM | POA: Diagnosis not present

## 2024-06-15 DIAGNOSIS — Z933 Colostomy status: Secondary | ICD-10-CM | POA: Diagnosis not present

## 2024-06-15 DIAGNOSIS — E11622 Type 2 diabetes mellitus with other skin ulcer: Secondary | ICD-10-CM | POA: Diagnosis not present

## 2024-06-15 LAB — GLUCOSE, CAPILLARY
Glucose-Capillary: 254 mg/dL — ABNORMAL HIGH (ref 70–99)
Glucose-Capillary: 266 mg/dL — ABNORMAL HIGH (ref 70–99)

## 2024-06-16 ENCOUNTER — Encounter (HOSPITAL_BASED_OUTPATIENT_CLINIC_OR_DEPARTMENT_OTHER): Admitting: Internal Medicine

## 2024-06-16 DIAGNOSIS — C20 Malignant neoplasm of rectum: Secondary | ICD-10-CM | POA: Diagnosis not present

## 2024-06-16 DIAGNOSIS — N360 Urethral fistula: Secondary | ICD-10-CM | POA: Diagnosis not present

## 2024-06-17 ENCOUNTER — Encounter (HOSPITAL_BASED_OUTPATIENT_CLINIC_OR_DEPARTMENT_OTHER): Admitting: Internal Medicine

## 2024-06-17 DIAGNOSIS — X58XXXA Exposure to other specified factors, initial encounter: Secondary | ICD-10-CM | POA: Diagnosis not present

## 2024-06-17 DIAGNOSIS — C20 Malignant neoplasm of rectum: Secondary | ICD-10-CM

## 2024-06-17 DIAGNOSIS — M4628 Osteomyelitis of vertebra, sacral and sacrococcygeal region: Secondary | ICD-10-CM | POA: Diagnosis not present

## 2024-06-17 DIAGNOSIS — L598 Other specified disorders of the skin and subcutaneous tissue related to radiation: Secondary | ICD-10-CM | POA: Diagnosis not present

## 2024-06-17 DIAGNOSIS — S31000A Unspecified open wound of lower back and pelvis without penetration into retroperitoneum, initial encounter: Secondary | ICD-10-CM | POA: Diagnosis not present

## 2024-06-17 DIAGNOSIS — E11622 Type 2 diabetes mellitus with other skin ulcer: Secondary | ICD-10-CM | POA: Diagnosis not present

## 2024-06-17 DIAGNOSIS — Z933 Colostomy status: Secondary | ICD-10-CM | POA: Diagnosis not present

## 2024-06-17 LAB — GLUCOSE, CAPILLARY: Glucose-Capillary: 254 mg/dL — ABNORMAL HIGH (ref 70–99)

## 2024-06-18 DIAGNOSIS — C20 Malignant neoplasm of rectum: Secondary | ICD-10-CM | POA: Diagnosis not present

## 2024-06-19 DIAGNOSIS — C20 Malignant neoplasm of rectum: Secondary | ICD-10-CM | POA: Diagnosis not present

## 2024-06-20 DIAGNOSIS — Z86718 Personal history of other venous thrombosis and embolism: Secondary | ICD-10-CM | POA: Diagnosis not present

## 2024-06-20 DIAGNOSIS — K660 Peritoneal adhesions (postprocedural) (postinfection): Secondary | ICD-10-CM | POA: Diagnosis not present

## 2024-06-20 DIAGNOSIS — Z9221 Personal history of antineoplastic chemotherapy: Secondary | ICD-10-CM | POA: Diagnosis not present

## 2024-06-20 DIAGNOSIS — K810 Acute cholecystitis: Secondary | ICD-10-CM | POA: Diagnosis not present

## 2024-06-20 DIAGNOSIS — C20 Malignant neoplasm of rectum: Secondary | ICD-10-CM | POA: Diagnosis not present

## 2024-06-20 DIAGNOSIS — Z7901 Long term (current) use of anticoagulants: Secondary | ICD-10-CM | POA: Diagnosis not present

## 2024-06-20 DIAGNOSIS — I10 Essential (primary) hypertension: Secondary | ICD-10-CM | POA: Diagnosis not present

## 2024-06-20 DIAGNOSIS — I2699 Other pulmonary embolism without acute cor pulmonale: Secondary | ICD-10-CM | POA: Diagnosis not present

## 2024-06-20 DIAGNOSIS — Z91041 Radiographic dye allergy status: Secondary | ICD-10-CM | POA: Diagnosis not present

## 2024-06-20 DIAGNOSIS — K8020 Calculus of gallbladder without cholecystitis without obstruction: Secondary | ICD-10-CM | POA: Diagnosis not present

## 2024-06-20 DIAGNOSIS — G4733 Obstructive sleep apnea (adult) (pediatric): Secondary | ICD-10-CM | POA: Diagnosis not present

## 2024-06-20 DIAGNOSIS — Z79899 Other long term (current) drug therapy: Secondary | ICD-10-CM | POA: Diagnosis not present

## 2024-06-20 DIAGNOSIS — Z85048 Personal history of other malignant neoplasm of rectum, rectosigmoid junction, and anus: Secondary | ICD-10-CM | POA: Diagnosis not present

## 2024-06-20 DIAGNOSIS — Z7951 Long term (current) use of inhaled steroids: Secondary | ICD-10-CM | POA: Diagnosis not present

## 2024-06-20 DIAGNOSIS — Z7984 Long term (current) use of oral hypoglycemic drugs: Secondary | ICD-10-CM | POA: Diagnosis not present

## 2024-06-20 DIAGNOSIS — Z933 Colostomy status: Secondary | ICD-10-CM | POA: Diagnosis not present

## 2024-06-20 DIAGNOSIS — E0865 Diabetes mellitus due to underlying condition with hyperglycemia: Secondary | ICD-10-CM | POA: Diagnosis not present

## 2024-06-20 LAB — GLUCOSE, CAPILLARY: Glucose-Capillary: 254 mg/dL — ABNORMAL HIGH (ref 70–99)

## 2024-06-21 DIAGNOSIS — K810 Acute cholecystitis: Secondary | ICD-10-CM | POA: Diagnosis not present

## 2024-06-21 DIAGNOSIS — Z86718 Personal history of other venous thrombosis and embolism: Secondary | ICD-10-CM | POA: Diagnosis not present

## 2024-06-21 DIAGNOSIS — K660 Peritoneal adhesions (postprocedural) (postinfection): Secondary | ICD-10-CM | POA: Diagnosis not present

## 2024-06-21 DIAGNOSIS — Z7984 Long term (current) use of oral hypoglycemic drugs: Secondary | ICD-10-CM | POA: Diagnosis not present

## 2024-06-21 DIAGNOSIS — E0865 Diabetes mellitus due to underlying condition with hyperglycemia: Secondary | ICD-10-CM | POA: Diagnosis not present

## 2024-06-21 DIAGNOSIS — Z9221 Personal history of antineoplastic chemotherapy: Secondary | ICD-10-CM | POA: Diagnosis not present

## 2024-06-21 DIAGNOSIS — G4733 Obstructive sleep apnea (adult) (pediatric): Secondary | ICD-10-CM | POA: Diagnosis not present

## 2024-06-21 DIAGNOSIS — Z7951 Long term (current) use of inhaled steroids: Secondary | ICD-10-CM | POA: Diagnosis not present

## 2024-06-21 DIAGNOSIS — Z85048 Personal history of other malignant neoplasm of rectum, rectosigmoid junction, and anus: Secondary | ICD-10-CM | POA: Diagnosis not present

## 2024-06-21 DIAGNOSIS — C20 Malignant neoplasm of rectum: Secondary | ICD-10-CM | POA: Diagnosis not present

## 2024-06-21 DIAGNOSIS — Z91041 Radiographic dye allergy status: Secondary | ICD-10-CM | POA: Diagnosis not present

## 2024-06-21 DIAGNOSIS — I10 Essential (primary) hypertension: Secondary | ICD-10-CM | POA: Diagnosis not present

## 2024-06-21 DIAGNOSIS — K8020 Calculus of gallbladder without cholecystitis without obstruction: Secondary | ICD-10-CM | POA: Diagnosis not present

## 2024-06-21 DIAGNOSIS — Z7901 Long term (current) use of anticoagulants: Secondary | ICD-10-CM | POA: Diagnosis not present

## 2024-06-21 DIAGNOSIS — Z933 Colostomy status: Secondary | ICD-10-CM | POA: Diagnosis not present

## 2024-06-21 DIAGNOSIS — Z79899 Other long term (current) drug therapy: Secondary | ICD-10-CM | POA: Diagnosis not present

## 2024-06-22 DIAGNOSIS — C20 Malignant neoplasm of rectum: Secondary | ICD-10-CM | POA: Diagnosis not present

## 2024-06-24 DIAGNOSIS — C20 Malignant neoplasm of rectum: Secondary | ICD-10-CM | POA: Diagnosis not present

## 2024-06-25 DIAGNOSIS — C20 Malignant neoplasm of rectum: Secondary | ICD-10-CM | POA: Diagnosis not present

## 2024-06-26 DIAGNOSIS — C20 Malignant neoplasm of rectum: Secondary | ICD-10-CM | POA: Diagnosis not present

## 2024-06-27 DIAGNOSIS — C20 Malignant neoplasm of rectum: Secondary | ICD-10-CM | POA: Diagnosis not present

## 2024-06-28 DIAGNOSIS — C20 Malignant neoplasm of rectum: Secondary | ICD-10-CM | POA: Diagnosis not present

## 2024-06-29 DIAGNOSIS — C20 Malignant neoplasm of rectum: Secondary | ICD-10-CM | POA: Diagnosis not present

## 2024-06-30 DIAGNOSIS — Z9049 Acquired absence of other specified parts of digestive tract: Secondary | ICD-10-CM | POA: Diagnosis not present

## 2024-06-30 DIAGNOSIS — C20 Malignant neoplasm of rectum: Secondary | ICD-10-CM | POA: Diagnosis not present

## 2024-06-30 DIAGNOSIS — Z87448 Personal history of other diseases of urinary system: Secondary | ICD-10-CM | POA: Diagnosis not present

## 2024-06-30 DIAGNOSIS — Z79899 Other long term (current) drug therapy: Secondary | ICD-10-CM | POA: Diagnosis not present

## 2024-06-30 DIAGNOSIS — Z933 Colostomy status: Secondary | ICD-10-CM | POA: Diagnosis not present

## 2024-06-30 DIAGNOSIS — K435 Parastomal hernia without obstruction or  gangrene: Secondary | ICD-10-CM | POA: Diagnosis not present

## 2024-06-30 DIAGNOSIS — L89159 Pressure ulcer of sacral region, unspecified stage: Secondary | ICD-10-CM | POA: Diagnosis not present

## 2024-06-30 DIAGNOSIS — Z8719 Personal history of other diseases of the digestive system: Secondary | ICD-10-CM | POA: Diagnosis not present

## 2024-06-30 DIAGNOSIS — Z85048 Personal history of other malignant neoplasm of rectum, rectosigmoid junction, and anus: Secondary | ICD-10-CM | POA: Diagnosis not present

## 2024-07-02 DIAGNOSIS — C20 Malignant neoplasm of rectum: Secondary | ICD-10-CM | POA: Diagnosis not present

## 2024-07-03 DIAGNOSIS — C20 Malignant neoplasm of rectum: Secondary | ICD-10-CM | POA: Diagnosis not present

## 2024-07-04 DIAGNOSIS — C20 Malignant neoplasm of rectum: Secondary | ICD-10-CM | POA: Diagnosis not present

## 2024-07-05 DIAGNOSIS — C20 Malignant neoplasm of rectum: Secondary | ICD-10-CM | POA: Diagnosis not present

## 2024-07-06 DIAGNOSIS — C20 Malignant neoplasm of rectum: Secondary | ICD-10-CM | POA: Diagnosis not present

## 2024-07-07 ENCOUNTER — Encounter: Payer: Self-pay | Admitting: Nurse Practitioner

## 2024-07-07 ENCOUNTER — Inpatient Hospital Stay: Attending: Nurse Practitioner | Admitting: Nurse Practitioner

## 2024-07-07 VITALS — BP 159/76 | HR 92 | Temp 98.2°F | Resp 17 | Wt 198.7 lb

## 2024-07-07 DIAGNOSIS — C20 Malignant neoplasm of rectum: Secondary | ICD-10-CM

## 2024-07-07 DIAGNOSIS — G893 Neoplasm related pain (acute) (chronic): Secondary | ICD-10-CM | POA: Diagnosis not present

## 2024-07-07 DIAGNOSIS — R530 Neoplastic (malignant) related fatigue: Secondary | ICD-10-CM | POA: Diagnosis not present

## 2024-07-07 DIAGNOSIS — Z515 Encounter for palliative care: Secondary | ICD-10-CM

## 2024-07-07 NOTE — Progress Notes (Signed)
 Palliative Medicine [facility]  Telephone:(336) [phone] Fax:(336) [phone]   Name: [name] Date: 07/07/2024 MRN: [id number]  DOB: August 04, 1971  Patient Care Team: Arloa Elsie SAUNDERS, MD as PCP - General (Family Medicine) Loretha Ash, MD as Consulting Physician (Hematology and Oncology) Debby Hila, MD as Consulting Physician (General Surgery) Causey, Morna Pickle, NP as Nurse Practitioner (Hematology and Oncology) Dewey Rush, MD as Consulting Physician (Radiation Oncology) Mansouraty, Aloha Raddle., MD as Consulting Physician (Gastroenterology) Elisabeth Valli BIRCH, MD as Consulting Physician (Urology) Pickenpack-Cousar, Fannie SAILOR, NP as Nurse Practitioner (Hospice and Palliative Medicine)   INTERVAL HISTORY: Isador Castille is a 53 y.o. male with oncologic medical history including stage II rectal adenocarcinoma s/p chemoradiation (2022), resection with ostomy, chronic pelvic abscess.  Palliative is seeing patient for symptom management and goals of care.   SOCIAL HISTORY:     reports that he has never smoked. He has never used smokeless tobacco. He reports current alcohol use. He reports that he does not use drugs.  ADVANCE DIRECTIVES:  None on file   CODE STATUS: Full code  PAST MEDICAL HISTORY: Past Medical History:  Diagnosis Date   Anemia 02/2021   iron    Anxiety    Asthma    Bilateral pulmonary embolism (HCC) 04/24/2019   Dehiscence of anastomosis of large intestine 12/31/2022   Depression    Diabetes mellitus without complication (HCC)    History of kidney stones    HLD (hyperlipidemia)    Hypertension    Kidney stones    Neuromuscular disorder (HCC)    from radiation bi lat   OSA (obstructive sleep apnea)    CPAP   Peripheral vascular disease    DVT   rectal ca 02/2021   Sleep apnea     ALLERGIES:  is allergic to contrast media [iodinated contrast media].  MEDICATIONS:  Current Outpatient Medications  Medication Sig  Dispense Refill   albuterol  (PROAIR  HFA) 108 (90 Base) MCG/ACT inhaler Inhale 2 puffs into the lungs every 4 (four) hours as needed for wheezing or shortness of breath.     ALPRAZolam  (XANAX ) 0.5 MG tablet Take 0.5 mg by mouth 2 (two) times daily as needed for anxiety or sleep.     calcium  carbonate (TUMS - DOSED IN MG ELEMENTAL CALCIUM ) 500 MG chewable tablet Chew 2 tablets by mouth daily as needed for indigestion or heartburn.     cyclobenzaprine  (FLEXERIL ) 10 MG tablet Take 1 tablet (10 mg total) by mouth 3 (three) times daily as needed for muscle spasms. 270 tablet 3   diphenhydrAMINE  (BENADRYL ) 25 mg capsule Take 1 capsule (25 mg total) by mouth every 6 (six) hours as needed for itching. (Patient taking differently: Take 25 mg by mouth See admin instructions. Take 25-50 mg by mouth as directed prior to receiving contrast dye as a part of the Benadryl  + Prednisone  pre-treatment regimen)     escitalopram  (LEXAPRO ) 20 MG tablet Take 1 tablet (20 mg total) by mouth at bedtime. 90 tablet 1   ferrous sulfate  325 (65 FE) MG tablet Take 1 tablet (325 mg total) by mouth 2 (two) times daily with a meal. (Patient not taking: Reported on 03/03/2024)     Gauze Pads & Dressings (COMBINE ABD) 5X9 PADS 1 Units by Does not apply route daily. 30 each 3   Gauze Pads & Dressings (KERLIX GAUZE ROLL LARGE) MISC 1 Units by Does not apply route daily. 30 each 3   morphine  (MS CONTIN ) 30 MG 12  hr tablet Take 2 tablets (60 mg total) by mouth every 12 (twelve) hours. 120 tablet 0   morphine  (MSIR) 15 MG tablet Take 0.5-1 tablets (7.5-15 mg total) by mouth every 4 (four) hours as needed for moderate pain (pain score 4-6) or severe pain (pain score 7-10). 90 tablet 0   PRESCRIPTION MEDICATION See admin instructions. TPN from [facility] (scanned into the Media tab in Epic by Registration as a document on 03/03/2024) Beginning at 8-9 PM for twelve hours     Simethicone  (GAS-X PO) Take 1-2 tablets by mouth as needed (gas).      tetrahydrozoline-zinc  (VISINE-AC) 0.05-0.25 % ophthalmic solution Place 2 drops into both eyes 3 (three) times daily as needed (for allergies).     TYLENOL  500 MG tablet Take 500-1,000 mg by mouth See admin instructions. Take 1,000 mg by mouth at 6 AM, 10 AM, and 6 PM AND 500 mg at 2 PM     XARELTO  20 MG TABS tablet TAKE 1 TABLET(20 MG) BY MOUTH DAILY WITH SUPPER 90 tablet 3   No current facility-administered medications for this visit.    VITAL SIGNS: BP (!) 159/76 (BP Location: Right Arm, Patient Position: Sitting)   Pulse 92   Temp 98.2 F (36.8 C) (Temporal)   Resp 17   Wt 198 lb 11.2 oz (90.1 kg)   SpO2 99%   BMI 28.51 kg/m  Filed Weights   07/07/24 1006  Weight: 198 lb 11.2 oz (90.1 kg)    Estimated body mass index is 28.51 kg/m as calculated from the following:   Height as of 03/04/24: 5' 10 (1.778 m).   Weight as of this encounter: 198 lb 11.2 oz (90.1 kg).   PERFORMANCE STATUS (ECOG) : 1 - Symptomatic but completely ambulatory  Assessment NAD RRR Normal breathing pattern Colostomy in place AAO x3  IMPRESSION: Discussed the use of AI scribe software for clinical note transcription with the patient, who gave verbal consent to proceed.  History of Present Illness [name] presents to clinic for symptom management follow-up. He is doing well overall. No acute distress.   He drove himself to the appointment and is feeling much better than previous weeks. This is a significant improvement for him. He shares that he has one more surgery ahead, specifically a closing flap surgery, and is uncertain about the potential pain associated with it.   Raylin has been able to wean off of all of his pain medications. He is able to manage discomforts with Tylenol . He describes experiencing sleep disturbances while coming off morphine , including restless legs and difficulty falling asleep, which led him to use his phone until he felt tired. However, these issues are  resolving.  Regarding nutrition, he is being weaned off total parenteral nutrition (TPN) with two more bags remaining. He is now consuming full-calorie meals and has noticed a weight loss of three to four pounds, although he is not overly concerned as his target weight is 204 pounds for the upcoming surgery. He is focused on maintaining protein intake.  He underwent gallbladder removal on June 20, 2024, and was discharged the following day. He reports changes in bowel regularity post-surgery but no alarming symptoms.  His energy levels vary, with some days being better than others. He recounts an instance of feeling fatigued while doing household chores but was able to rest and complete the task.  Much appreciative of how well Mr. Batty is doing and overall improvement.   We will continue to closely monitor  and support.  Goals of Care 02/11/24: We discussed his current illness and what it means in the larger context of ihs on-going co-morbidities. Natural disease trajectory and expectations were discussed. Mr. Fissel and his wife are realistic in their understanding of current conditions and plan of care. He is remaining hopeful for stability and improvement in the near future. He is emotional expressing his decreased quality of life and health challenges. Continues to take things one day at a time as his goals are clear to continue to treat the treatable aggressively allowing him every opportunity to thrive while managing symptoms.   I discussed the importance of continued conversation with family and their medical providers regarding overall plan of care and treatment options, ensuring decisions are within the context of the patients values and GOCs. Assessment & Plan  Postoperative care following cholecystectomy Post-cholecystectomy status post-surgery on June 20, 2024, with overnight stay and discharge on June 21, 2024. Bowel regularity slightly altered without alarming symptoms  or complications.  Planned surgical wound closure Upcoming surgical wound closure with concerns about potential post-surgical pain and need for pain management adjustments. - Monitor pain post-surgery and contact provider if pain management needs change.  Chronic post-surgical pain management No morphine  use since June 22, 2024. Currently managing pain with Tylenol . Experienced sleep disturbances and restless leg symptoms during morphine  withdrawal, resolving. - Contact provider if pain management needs change or if new symptoms arise.  Nutritional support, weaning off total parenteral nutrition (TPN) Currently weaning off TPN with two bags remaining. Transitioning to full oral caloric intake. Recent weight loss aligns with pre-surgical BMI goals.  Follow-up Follow-up as needed for support.  - Schedule follow-up in 4-6 weeks. Sooner if needed.  - Contact provider if refills needed before appointment.  Patient expressed understanding and was in agreement with this plan. He also understands that He can call the clinic at any time with any questions, concerns, or complaints.   Any controlled substances utilized were prescribed in the context of palliative care. PDMP has been reviewed.   Visit consisted of counseling and education dealing with the complex and emotionally intense issues of symptom management and palliative care in the setting of serious and potentially life-threatening illness.  Levon Borer, AGPCNP-BC  Palliative Medicine Team/[facility]

## 2024-07-10 DIAGNOSIS — C20 Malignant neoplasm of rectum: Secondary | ICD-10-CM | POA: Diagnosis not present

## 2024-07-13 DIAGNOSIS — Z932 Ileostomy status: Secondary | ICD-10-CM | POA: Diagnosis not present

## 2024-07-13 DIAGNOSIS — C20 Malignant neoplasm of rectum: Secondary | ICD-10-CM | POA: Diagnosis not present

## 2024-07-17 DIAGNOSIS — C20 Malignant neoplasm of rectum: Secondary | ICD-10-CM | POA: Diagnosis not present

## 2024-07-18 ENCOUNTER — Encounter (HOSPITAL_BASED_OUTPATIENT_CLINIC_OR_DEPARTMENT_OTHER): Attending: Internal Medicine | Admitting: Internal Medicine

## 2024-07-18 DIAGNOSIS — X58XXXA Exposure to other specified factors, initial encounter: Secondary | ICD-10-CM | POA: Insufficient documentation

## 2024-07-18 DIAGNOSIS — Z8719 Personal history of other diseases of the digestive system: Secondary | ICD-10-CM | POA: Insufficient documentation

## 2024-07-18 DIAGNOSIS — L598 Other specified disorders of the skin and subcutaneous tissue related to radiation: Secondary | ICD-10-CM | POA: Diagnosis not present

## 2024-07-18 DIAGNOSIS — Z85048 Personal history of other malignant neoplasm of rectum, rectosigmoid junction, and anus: Secondary | ICD-10-CM | POA: Insufficient documentation

## 2024-07-18 DIAGNOSIS — M4628 Osteomyelitis of vertebra, sacral and sacrococcygeal region: Secondary | ICD-10-CM | POA: Diagnosis not present

## 2024-07-18 DIAGNOSIS — E11622 Type 2 diabetes mellitus with other skin ulcer: Secondary | ICD-10-CM | POA: Diagnosis not present

## 2024-07-18 DIAGNOSIS — Z933 Colostomy status: Secondary | ICD-10-CM | POA: Diagnosis not present

## 2024-07-18 DIAGNOSIS — S31000A Unspecified open wound of lower back and pelvis without penetration into retroperitoneum, initial encounter: Secondary | ICD-10-CM | POA: Insufficient documentation

## 2024-07-23 DIAGNOSIS — G4733 Obstructive sleep apnea (adult) (pediatric): Secondary | ICD-10-CM | POA: Diagnosis not present

## 2024-07-24 DIAGNOSIS — C20 Malignant neoplasm of rectum: Secondary | ICD-10-CM | POA: Diagnosis not present

## 2024-07-27 DIAGNOSIS — C20 Malignant neoplasm of rectum: Secondary | ICD-10-CM | POA: Diagnosis not present

## 2024-07-29 DIAGNOSIS — X58XXXA Exposure to other specified factors, initial encounter: Secondary | ICD-10-CM | POA: Diagnosis not present

## 2024-07-29 DIAGNOSIS — N360 Urethral fistula: Secondary | ICD-10-CM | POA: Diagnosis not present

## 2024-07-29 DIAGNOSIS — S31819A Unspecified open wound of right buttock, initial encounter: Secondary | ICD-10-CM | POA: Diagnosis not present

## 2024-07-29 DIAGNOSIS — Z923 Personal history of irradiation: Secondary | ICD-10-CM | POA: Diagnosis not present

## 2024-07-29 DIAGNOSIS — Z9889 Other specified postprocedural states: Secondary | ICD-10-CM | POA: Diagnosis not present

## 2024-07-29 DIAGNOSIS — S31819D Unspecified open wound of right buttock, subsequent encounter: Secondary | ICD-10-CM | POA: Diagnosis not present

## 2024-07-29 DIAGNOSIS — Z466 Encounter for fitting and adjustment of urinary device: Secondary | ICD-10-CM | POA: Diagnosis not present

## 2024-08-15 ENCOUNTER — Inpatient Hospital Stay

## 2024-08-15 DIAGNOSIS — N360 Urethral fistula: Secondary | ICD-10-CM | POA: Diagnosis not present

## 2024-08-18 ENCOUNTER — Encounter (HOSPITAL_BASED_OUTPATIENT_CLINIC_OR_DEPARTMENT_OTHER): Admitting: Internal Medicine

## 2024-08-23 ENCOUNTER — Encounter (HOSPITAL_BASED_OUTPATIENT_CLINIC_OR_DEPARTMENT_OTHER): Attending: Internal Medicine | Admitting: Internal Medicine

## 2024-08-23 DIAGNOSIS — M4628 Osteomyelitis of vertebra, sacral and sacrococcygeal region: Secondary | ICD-10-CM

## 2024-08-23 DIAGNOSIS — G4733 Obstructive sleep apnea (adult) (pediatric): Secondary | ICD-10-CM | POA: Diagnosis not present

## 2024-08-23 DIAGNOSIS — E11622 Type 2 diabetes mellitus with other skin ulcer: Secondary | ICD-10-CM | POA: Diagnosis not present

## 2024-08-23 DIAGNOSIS — L598 Other specified disorders of the skin and subcutaneous tissue related to radiation: Secondary | ICD-10-CM | POA: Diagnosis not present

## 2024-08-23 DIAGNOSIS — S31000A Unspecified open wound of lower back and pelvis without penetration into retroperitoneum, initial encounter: Secondary | ICD-10-CM | POA: Diagnosis not present

## 2024-08-23 DIAGNOSIS — X58XXXA Exposure to other specified factors, initial encounter: Secondary | ICD-10-CM | POA: Diagnosis not present

## 2024-08-23 DIAGNOSIS — Z933 Colostomy status: Secondary | ICD-10-CM | POA: Diagnosis not present

## 2024-08-23 DIAGNOSIS — C20 Malignant neoplasm of rectum: Secondary | ICD-10-CM | POA: Diagnosis not present

## 2024-08-28 ENCOUNTER — Encounter (HOSPITAL_BASED_OUTPATIENT_CLINIC_OR_DEPARTMENT_OTHER): Payer: Self-pay

## 2024-09-22 DIAGNOSIS — G4733 Obstructive sleep apnea (adult) (pediatric): Secondary | ICD-10-CM | POA: Diagnosis not present

## 2024-10-04 ENCOUNTER — Encounter (HOSPITAL_BASED_OUTPATIENT_CLINIC_OR_DEPARTMENT_OTHER): Attending: Internal Medicine | Admitting: Internal Medicine

## 2024-10-04 DIAGNOSIS — C20 Malignant neoplasm of rectum: Secondary | ICD-10-CM | POA: Insufficient documentation

## 2024-10-04 DIAGNOSIS — E1169 Type 2 diabetes mellitus with other specified complication: Secondary | ICD-10-CM | POA: Diagnosis not present

## 2024-10-04 DIAGNOSIS — M4628 Osteomyelitis of vertebra, sacral and sacrococcygeal region: Secondary | ICD-10-CM | POA: Diagnosis not present

## 2024-10-04 DIAGNOSIS — X58XXXA Exposure to other specified factors, initial encounter: Secondary | ICD-10-CM | POA: Insufficient documentation

## 2024-10-04 DIAGNOSIS — Z933 Colostomy status: Secondary | ICD-10-CM | POA: Diagnosis not present

## 2024-10-04 DIAGNOSIS — S31000A Unspecified open wound of lower back and pelvis without penetration into retroperitoneum, initial encounter: Secondary | ICD-10-CM | POA: Insufficient documentation

## 2024-10-04 DIAGNOSIS — L598 Other specified disorders of the skin and subcutaneous tissue related to radiation: Secondary | ICD-10-CM | POA: Diagnosis not present

## 2024-11-15 ENCOUNTER — Encounter (HOSPITAL_BASED_OUTPATIENT_CLINIC_OR_DEPARTMENT_OTHER): Admitting: Internal Medicine
